# Patient Record
Sex: Male | Born: 1935 | Race: Black or African American | Hispanic: No | Smoking: Former smoker
Health system: Southern US, Community
[De-identification: ages and names within clinical notes are randomized; demographics above are authoritative.]

## PROBLEM LIST (undated history)

## (undated) DIAGNOSIS — A0472 Enterocolitis due to Clostridium difficile, not specified as recurrent: Secondary | ICD-10-CM

## (undated) DIAGNOSIS — R000 Tachycardia, unspecified: Secondary | ICD-10-CM

## (undated) DIAGNOSIS — I5032 Chronic diastolic (congestive) heart failure: Secondary | ICD-10-CM

## (undated) DIAGNOSIS — E8809 Other disorders of plasma-protein metabolism, not elsewhere classified: Secondary | ICD-10-CM

## (undated) DIAGNOSIS — D649 Anemia, unspecified: Secondary | ICD-10-CM

## (undated) DIAGNOSIS — C61 Malignant neoplasm of prostate: Secondary | ICD-10-CM

## (undated) DIAGNOSIS — I119 Hypertensive heart disease without heart failure: Secondary | ICD-10-CM

## (undated) DIAGNOSIS — I10 Essential (primary) hypertension: Secondary | ICD-10-CM

## (undated) DIAGNOSIS — Z8719 Personal history of other diseases of the digestive system: Secondary | ICD-10-CM

## (undated) DIAGNOSIS — J989 Respiratory disorder, unspecified: Secondary | ICD-10-CM

## (undated) DIAGNOSIS — E119 Type 2 diabetes mellitus without complications: Secondary | ICD-10-CM

## (undated) DIAGNOSIS — I493 Ventricular premature depolarization: Secondary | ICD-10-CM

## (undated) DIAGNOSIS — I491 Atrial premature depolarization: Secondary | ICD-10-CM

## (undated) DIAGNOSIS — E785 Hyperlipidemia, unspecified: Secondary | ICD-10-CM

## (undated) DIAGNOSIS — I468 Cardiac arrest due to other underlying condition: Secondary | ICD-10-CM

## (undated) DIAGNOSIS — E46 Unspecified protein-calorie malnutrition: Secondary | ICD-10-CM

## (undated) DIAGNOSIS — K56609 Unspecified intestinal obstruction, unspecified as to partial versus complete obstruction: Secondary | ICD-10-CM

## (undated) HISTORY — DX: Other disorders of plasma-protein metabolism, not elsewhere classified: E88.09

## (undated) HISTORY — DX: Chronic diastolic (congestive) heart failure: I50.32

## (undated) HISTORY — DX: Unspecified intestinal obstruction, unspecified as to partial versus complete obstruction: K56.609

## (undated) HISTORY — DX: Tachycardia, unspecified: R00.0

## (undated) HISTORY — DX: Unspecified protein-calorie malnutrition: E46

## (undated) HISTORY — PX: TONSILLECTOMY: SUR1361

## (undated) HISTORY — DX: Enterocolitis due to Clostridium difficile, not specified as recurrent: A04.72

## (undated) HISTORY — DX: Hyperlipidemia, unspecified: E78.5

## (undated) HISTORY — PX: INGUINAL HERNIA REPAIR: SUR1180

## (undated) HISTORY — DX: Anemia, unspecified: D64.9

## (undated) HISTORY — DX: Ventricular premature depolarization: I49.3

## (undated) HISTORY — DX: Hypertensive heart disease without heart failure: I11.9

---

## 1967-07-12 HISTORY — PX: HEMORROIDECTOMY: SUR656

## 1979-07-12 HISTORY — PX: HERNIA REPAIR: SHX51

## 2010-02-17 ENCOUNTER — Ambulatory Visit: Payer: Self-pay | Admitting: Cardiology

## 2010-06-21 ENCOUNTER — Ambulatory Visit: Payer: Self-pay | Admitting: Cardiology

## 2010-10-20 ENCOUNTER — Encounter: Payer: Self-pay | Admitting: Cardiology

## 2010-10-20 DIAGNOSIS — I10 Essential (primary) hypertension: Secondary | ICD-10-CM | POA: Insufficient documentation

## 2010-10-21 ENCOUNTER — Ambulatory Visit (INDEPENDENT_AMBULATORY_CARE_PROVIDER_SITE_OTHER): Payer: Medicare Other | Admitting: Cardiology

## 2010-10-21 ENCOUNTER — Encounter: Payer: Self-pay | Admitting: Cardiology

## 2010-10-21 DIAGNOSIS — I10 Essential (primary) hypertension: Secondary | ICD-10-CM

## 2010-10-21 DIAGNOSIS — E785 Hyperlipidemia, unspecified: Secondary | ICD-10-CM

## 2010-10-21 DIAGNOSIS — E119 Type 2 diabetes mellitus without complications: Secondary | ICD-10-CM

## 2010-10-21 NOTE — Assessment & Plan Note (Signed)
The patient has been doing well since last visit.  He is not having any side effects from the Lipitor.  He did not come to the office fasting today so we did not draw lab work today.

## 2010-10-21 NOTE — Assessment & Plan Note (Signed)
The patient has not been having any chest pain or shortness of breath.  He denies any dizziness or syncope.  He's not having any palpitations.  He stays physically active by gardening and also does some walking.

## 2010-10-21 NOTE — Progress Notes (Signed)
HPI: This pleasant 75 year old gentleman is seen for a four-month followup office visit.  He has a history of essential hypertension and history of diabetes mellitus and exogenous obesity.  Since last visit he's been doing well.  He does not have any history of ischemic heart disease.  He had a normal today treadmill Cardiolite stress test on 01/08/2004.  He has a history of mild cardiomegaly and had a echocardiogram 04/17/06 which showed mild LVH with normal systolic function and with diastolic dysfunction.  Current Outpatient Prescriptions  Medication Sig Dispense Refill  . aspirin 500 MG EC tablet Take 500 mg by mouth every 6 (six) hours as needed.        Marland Kitchen atorvastatin (LIPITOR) 40 MG tablet Take 40 mg by mouth daily.        Marland Kitchen diltiazem (TIAZAC) 360 MG 24 hr capsule Take 360 mg by mouth daily.        Marland Kitchen glipiZIDE (GLUCOTROL) 2.5 MG 24 hr tablet Take 2.5 mg by mouth daily.        . hydrochlorothiazide (HYDRODIURIL) 12.5 MG tablet Take 12.5 mg by mouth daily.        . Liraglutide (VICTOZA) 18 MG/3ML SOLN Inject into the skin as directed.        . metFORMIN (GLUCOPHAGE) 500 MG tablet Take 500 mg by mouth 2 (two) times daily with a meal.        . pioglitazone (ACTOS) 30 MG tablet Take 30 mg by mouth daily.          No Known Allergies  Patient Active Problem List  Diagnoses  . Hyperlipidemia  . Hypertension  . Diabetes mellitus    History  Smoking status  . Former Smoker -- 0.3 packs/day for 20 years  . Types: Cigarettes  . Quit date: 07/12/1979  Smokeless tobacco  . Never Used    History  Alcohol Use No    No family history on file.  Review of Systems: The patient denies any heat or cold intolerance.  No weight gain or weight loss.  The patient denies headaches or blurry vision.  There is no cough or sputum production.  The patient denies dizziness.  There is no hematuria or hematochezia.  The patient denies any muscle aches or arthritis.  The patient denies any rash.  The  patient denies frequent falling or instability.  There is no history of depression or anxiety.  All other systems were reviewed and are negative.   Physical Exam: Filed Vitals:   10/21/10 0924  BP: 120/60  Pulse: 78  Weight is 223, down 1 pound.  The general appearance reveals a well-developed well-nourished gentleman in no distress.Pupils equal and reactive.   Extraocular Movements are full.  There is no scleral icterus.  The mouth and pharynx are normal.  The neck is supple.  The carotids reveal no bruits.  The jugular venous pressure is normal.  The thyroid is not enlarged.  There is no lymphadenopathy.The chest is clear to percussion and auscultation. There are no rales or rhonchi. Expansion of the chest is symmetrical.The precordium is quiet.  The first heart sound is normal.  The second heart sound is physiologically split.  There is no murmur gallop rub or click.  There is no abnormal lift or heave.The abdomen is soft and nontender. Bowel sounds are normal. The liver and spleen are not enlarged. There Are no abdominal masses. There are no bruits.The pedal pulses are good.  There is no phlebitis or edema.  There  is no cyanosis or clubbing.Strength is normal and symmetrical in all extremities.  There is no lateralizing weakness.  There are no sensory deficits.The skin is warm and dry.  There is no rash.    Assessment / Plan: Continue same medication and be rechecked in 4 months for followup office visit fasting lipid panel and chemistries

## 2010-10-21 NOTE — Assessment & Plan Note (Signed)
The patient states that his blood sugars at home are generally in the range of 100 or so.  He is not having any hypoglycemic reactions.  His diabetes is followed closely by Dr. Lucianne Muss.

## 2011-03-07 ENCOUNTER — Other Ambulatory Visit: Payer: Self-pay | Admitting: Cardiology

## 2011-03-07 ENCOUNTER — Other Ambulatory Visit: Payer: Medicare Other | Admitting: *Deleted

## 2011-03-07 DIAGNOSIS — E785 Hyperlipidemia, unspecified: Secondary | ICD-10-CM

## 2011-03-07 DIAGNOSIS — I10 Essential (primary) hypertension: Secondary | ICD-10-CM

## 2011-03-09 ENCOUNTER — Ambulatory Visit
Admission: RE | Admit: 2011-03-09 | Discharge: 2011-03-09 | Disposition: A | Discharge status: Home / Self Care | Payer: Medicare Other | Source: Ambulatory Visit | Attending: Cardiology | Admitting: Cardiology

## 2011-03-09 ENCOUNTER — Encounter: Payer: Self-pay | Admitting: Cardiology

## 2011-03-09 ENCOUNTER — Ambulatory Visit (INDEPENDENT_AMBULATORY_CARE_PROVIDER_SITE_OTHER): Payer: Medicare Other | Admitting: Cardiology

## 2011-03-09 ENCOUNTER — Other Ambulatory Visit (INDEPENDENT_AMBULATORY_CARE_PROVIDER_SITE_OTHER): Payer: Medicare Other | Admitting: *Deleted

## 2011-03-09 VITALS — BP 120/60 | HR 66 | Wt 222.0 lb

## 2011-03-09 DIAGNOSIS — I119 Hypertensive heart disease without heart failure: Secondary | ICD-10-CM

## 2011-03-09 DIAGNOSIS — I10 Essential (primary) hypertension: Secondary | ICD-10-CM

## 2011-03-09 DIAGNOSIS — E119 Type 2 diabetes mellitus without complications: Secondary | ICD-10-CM

## 2011-03-09 DIAGNOSIS — E7800 Pure hypercholesterolemia, unspecified: Secondary | ICD-10-CM

## 2011-03-09 DIAGNOSIS — E785 Hyperlipidemia, unspecified: Secondary | ICD-10-CM

## 2011-03-09 LAB — HEPATIC FUNCTION PANEL
ALT: 16 U/L (ref 0–53)
Bilirubin, Direct: 0.1 mg/dL (ref 0.0–0.3)
Total Bilirubin: 0.6 mg/dL (ref 0.3–1.2)
Total Protein: 6.6 g/dL (ref 6.0–8.3)

## 2011-03-09 LAB — LIPID PANEL
Cholesterol: 124 mg/dL (ref 0–200)
HDL: 41.9 mg/dL (ref >39.00)
LDL Cholesterol: 70 mg/dL (ref 0–99)
Total CHOL/HDL Ratio: 3
Triglycerides: 61 mg/dL (ref 0.0–149.0)
VLDL: 12.2 mg/dL (ref 0.0–40.0)

## 2011-03-09 LAB — BASIC METABOLIC PANEL
BUN: 28 mg/dL — ABNORMAL HIGH (ref 6–23)
Calcium: 9.1 mg/dL (ref 8.4–10.5)
Creatinine, Ser: 1.4 mg/dL (ref 0.4–1.5)

## 2011-03-09 NOTE — Assessment & Plan Note (Signed)
The patient has remained on as careful diet.  He has lost 1 pound.  He remains on atorvastatin 40 mg daily.  Is not having any myalgias from the atorvastatin.  He does have some chronic left shoulder aching discomfort which is on a structural orthopedic basis.

## 2011-03-09 NOTE — Progress Notes (Signed)
Jeremy Jennings Date of Birth:  1935-11-13 [facility] [address]   [address] [place], [place]  [address] [phone]           Fax   [phone]  HPI: This pleasant 75 year old African American gentleman is seen for a scheduled 4 month followup office visit.  He has a history of hypertension and diabetes and exogenous obesity.  He does not have any history of ischemic heart disease.  He had a normal treadmill Cardiolite stress test on 01/08/04.  He has a history of mild cardiomegaly and an echocardiogram on 04/17/06 showed mild LVH with normal systolic function but with diastolic dysfunction.  Since last visit the patient has had no new cardiac symptoms.  He denies chest pain or shortness of breath.  He denies any peripheral edema.  His not having any dizziness or syncope.  Current Outpatient Prescriptions  Medication Sig Dispense Refill  . aspirin 500 MG EC tablet Take 500 mg by mouth every 6 (six) hours as needed.        Marland Kitchen atorvastatin (LIPITOR) 40 MG tablet Take 40 mg by mouth daily.        . Cholecalciferol (VITAMIN D PO) Take by mouth daily.        Marland Kitchen diltiazem (TIAZAC) 360 MG 24 hr capsule Take 360 mg by mouth daily.        Marland Kitchen glipiZIDE (GLUCOTROL) 2.5 MG 24 hr tablet Take 2.5 mg by mouth daily.        . hydrochlorothiazide (HYDRODIURIL) 12.5 MG tablet Take 12.5 mg by mouth daily.        . Liraglutide (VICTOZA) 18 MG/3ML SOLN Inject into the skin as directed.        . metFORMIN (GLUCOPHAGE) 500 MG tablet Take 500 mg by mouth 2 (two) times daily with a meal.        . pioglitazone (ACTOS) 30 MG tablet Take 30 mg by mouth daily.          No Known Allergies  Patient Active Problem List  Diagnoses  . Hyperlipidemia  . Hypertension  . Diabetes mellitus    History  Smoking status  . Former Smoker -- 0.3 packs/day for 20 years  . Types: Cigarettes  . Quit date: 07/12/1979  Smokeless tobacco  . Never Used    History  Alcohol Use No    No  family history on file.  Review of Systems: The patient denies any heat or cold intolerance.  No weight gain or weight loss.  The patient denies headaches or blurry vision.  There is no cough or sputum production.  The patient denies dizziness.  There is no hematuria or hematochezia.  The patient denies any muscle aches or arthritis.  The patient denies any rash.  The patient denies frequent falling or instability.  There is no history of depression or anxiety.  All other systems were reviewed and are negative.   Physical Exam: Filed Vitals:   03/09/11 1526  BP: 120/60  Pulse: 66  This pleasant middle-aged gentleman is seen.  He is in no distressPupils equal and reactive.   Extraocular Movements are full.  There is no scleral icterus.  The mouth and pharynx are normal.  The neck is supple.  The carotids reveal no bruits.  The jugular venous pressure is normal.  The thyroid is not enlarged.  There is no lymphadenopathy.  The chest is clear to percussion and auscultation. There are no rales or rhonchi. Expansion  of the chest is symmetrical.  The precordium is quiet.  The first heart sound is normal.  The second heart sound is physiologically split.  There is no murmur gallop rub or click.  There is no abnormal lift or heave.  The abdomen is soft and nontender. Bowel sounds are normal. The liver and spleen are not enlarged. There Are no abdominal masses. There are no bruits.  The pedal pulses are good.  There is no phlebitis or edema.  There is no cyanosis or clubbing.  Strength is normal and symmetrical in all extremities.  There is no lateralizing weakness.  There are no sensory deficits.      Assessment / Plan: Continue same medication.  Recheck in 4 months for followup office visit.  Continue to try to lose weight and to maintain aerobic exercise

## 2011-03-09 NOTE — Assessment & Plan Note (Signed)
His diabetes is followed closely by Dr. Lucianne Muss.  The patient denies any hypoglycemic symptoms

## 2011-03-09 NOTE — Assessment & Plan Note (Signed)
No dizziness or syncope.  No headaches

## 2011-03-24 NOTE — Progress Notes (Signed)
Left message

## 2011-03-25 ENCOUNTER — Telehealth: Payer: Self-pay | Admitting: Cardiology

## 2011-03-25 NOTE — Telephone Encounter (Signed)
Called returing Jeremy Jennings's call about his chest X-ray results that he had done 10 days ago. Please call back. I have pulled his chart.

## 2011-03-25 NOTE — Telephone Encounter (Signed)
Message copied by Eugenia Pancoast on Fri Mar 25, 2011  9:10 AM ------      Message from: Cassell Clement      Created: Wed Mar 09, 2011  5:27 PM       Please report.  The chest x-ray is normal.  No sign of lung cancer.  The heart size is normal and the lungs are clear

## 2011-03-25 NOTE — Telephone Encounter (Signed)
Advised CXR ok

## 2011-07-15 ENCOUNTER — Encounter: Payer: Self-pay | Admitting: Cardiology

## 2011-07-15 ENCOUNTER — Ambulatory Visit (INDEPENDENT_AMBULATORY_CARE_PROVIDER_SITE_OTHER): Payer: Medicare Other | Admitting: Cardiology

## 2011-07-15 ENCOUNTER — Other Ambulatory Visit: Payer: Medicare Other | Admitting: *Deleted

## 2011-07-15 VITALS — BP 110/60 | HR 78 | Ht 72.0 in | Wt 224.0 lb

## 2011-07-15 DIAGNOSIS — M67919 Unspecified disorder of synovium and tendon, unspecified shoulder: Secondary | ICD-10-CM

## 2011-07-15 DIAGNOSIS — E7800 Pure hypercholesterolemia, unspecified: Secondary | ICD-10-CM

## 2011-07-15 DIAGNOSIS — M7552 Bursitis of left shoulder: Secondary | ICD-10-CM

## 2011-07-15 DIAGNOSIS — Z79899 Other long term (current) drug therapy: Secondary | ICD-10-CM

## 2011-07-15 DIAGNOSIS — I119 Hypertensive heart disease without heart failure: Secondary | ICD-10-CM

## 2011-07-15 DIAGNOSIS — I10 Essential (primary) hypertension: Secondary | ICD-10-CM

## 2011-07-15 LAB — LIPID PANEL
HDL: 47 mg/dL (ref >39.00)
Total CHOL/HDL Ratio: 3
Triglycerides: 64 mg/dL (ref 0.0–149.0)
VLDL: 12.8 mg/dL (ref 0.0–40.0)

## 2011-07-15 LAB — HEPATIC FUNCTION PANEL
AST: 17 U/L (ref 0–37)
Albumin: 3.9 g/dL (ref 3.5–5.2)
Total Bilirubin: 0.4 mg/dL (ref 0.3–1.2)

## 2011-07-15 LAB — TSH: TSH: 3.77 u[IU]/mL (ref 0.35–5.50)

## 2011-07-15 LAB — BASIC METABOLIC PANEL
CO2: 27 mEq/L (ref 19–32)
Chloride: 104 mEq/L (ref 96–112)
Potassium: 4 mEq/L (ref 3.5–5.1)
Sodium: 139 mEq/L (ref 135–145)

## 2011-07-15 NOTE — Patient Instructions (Addendum)
Will obtain labs today and call you with the results  Have scheduled an appointment with Dr. Darrelyn Hillock on July 26, 2011 arrive at 8:00 for 8:15 am [phone]  Your physician recommends that you continue on your current medications as directed. Please refer to the Current Medication list given to you today.  Your physician wants you to follow-up in: 4 months You will receive a reminder letter in the mail two months in advance. If you don't receive a letter, please call our office to schedule the follow-up appointment.

## 2011-07-15 NOTE — Assessment & Plan Note (Signed)
The patient has not been having any chest pain increased dyspnea dizziness or syncope.  No palpitations.

## 2011-07-15 NOTE — Assessment & Plan Note (Signed)
The patient has had pain in his left shoulder for the past 2-3 weeks.  He is unable to elevate his left arm above the horizontal position.  There is no history of any trauma.  We are going to refer him to orthopedics for further evaluation

## 2011-07-15 NOTE — Progress Notes (Signed)
Jeremy Jennings Date of Birth:  03-02-1936 [facility] [address] [place], [place]  [address] [phone]         Fax   [phone]  History of Present Illness: This pleasant 76 year old gentleman is seen for a scheduled followup office visit.  His history of diabetes, essential hypertension, and exogenous obesity.  He does not have any history of ischemic heart disease.  He had a normal treadmill Cardiolite stress test in 2005.  He has a history of mild cardiomegaly.  An echocardiogram in 2007 showed mild LVH with normal systolic function and with diastolic dysfunction.  The patient has not been expressing any new cardiac symptoms since last visit.  Current Outpatient Prescriptions  Medication Sig Dispense Refill  . aspirin 500 MG EC tablet Take 500 mg by mouth every 6 (six) hours as needed.        Marland Kitchen atorvastatin (LIPITOR) 40 MG tablet Take 40 mg by mouth daily.        . Cholecalciferol (VITAMIN D PO) Take by mouth daily.        Marland Kitchen diltiazem (TIAZAC) 360 MG 24 hr capsule Take 360 mg by mouth daily.        Marland Kitchen glipiZIDE (GLUCOTROL) 2.5 MG 24 hr tablet Take 2.5 mg by mouth daily.        . hydrochlorothiazide (HYDRODIURIL) 12.5 MG tablet Take 12.5 mg by mouth daily.        . Liraglutide (VICTOZA) 18 MG/3ML SOLN Inject into the skin as directed.        . metFORMIN (GLUCOPHAGE) 500 MG tablet Take 500 mg by mouth 2 (two) times daily with a meal.        . pioglitazone (ACTOS) 30 MG tablet Take 30 mg by mouth daily.        Marland Kitchen FREESTYLE LITE test strip       . latanoprost (XALATAN) 0.005 % ophthalmic solution Place 0.005 % into both eyes as directed.        No Known Allergies  Patient Active Problem List  Diagnoses  . Hyperlipidemia  . Hypertension  . Diabetes mellitus    History  Smoking status  . Former Smoker -- 0.3 packs/day for 20 years  . Types: Cigarettes  . Quit date: 07/12/1979  Smokeless tobacco  . Never Used    History  Alcohol Use No    No  family history on file.  Review of Systems: Constitutional: no fever chills diaphoresis or fatigue or change in weight.  Head and neck: no hearing loss, no epistaxis, no photophobia or visual disturbance. Respiratory: No cough, shortness of breath or wheezing. Cardiovascular: No chest pain peripheral edema, palpitations. Gastrointestinal: No abdominal distention, no abdominal pain, no change in bowel habits hematochezia or melena. Genitourinary: No dysuria, no frequency, no urgency, no nocturia. Musculoskeletal:No arthralgias, no back pain, no gait disturbance or myalgias. Neurological: No dizziness, no headaches, no numbness, no seizures, no syncope, no weakness, no tremors. Hematologic: No lymphadenopathy, no easy bruising. Psychiatric: No confusion, no hallucinations, no sleep disturbance.    Physical Exam: Filed Vitals:   07/15/11 0900  BP: 110/60  Pulse: 78   The general appearance reveals a well-developed well-nourished gentleman in no distress.The head and neck exam reveals pupils equal and reactive.  Extraocular movements are full.  There is no scleral icterus.  The mouth and pharynx are normal.  The neck is supple.  The carotids reveal no bruits.  The jugular venous pressure is normal.  The  thyroid is not enlarged.  There is no lymphadenopathy.  The chest is clear to percussion and auscultation.  There are no rales or rhonchi.  Expansion of the chest is symmetrical.  The precordium is quiet.  The first heart sound is normal.  The second heart sound is physiologically split.  There is no murmur gallop rub or click.  There is no abnormal lift or heave.  The abdomen is soft and nontender.  The bowel sounds are normal.  The liver and spleen are not enlarged.  There are no abdominal masses.  There are no abdominal bruits.  Extremities reveal good pedal pulses.  There is no phlebitis or edema.  There is no cyanosis or clubbing.  Strength is normal and symmetrical in all extremities.   Elevation of the left arm is limited by pain the left shoulder.  There is no lateralizing weakness.  There are no sensory deficits.  The skin is warm and dry.  There is no rash.    Assessment / Plan:  Same medication.  For orthopedics regarding his left shoulder which may be bursitis or possibly a torn rotator cuff

## 2011-07-21 ENCOUNTER — Telehealth: Payer: Self-pay | Admitting: *Deleted

## 2011-07-21 NOTE — Telephone Encounter (Signed)
Message copied by Burnell Blanks on Thu Jul 21, 2011  5:36 PM ------      Message from: Cassell Clement      Created: Sun Jul 17, 2011  8:42 AM       Please report.  The labs are stable.  Continue same meds.  Continue careful diet.  Thyroid okay.      Send copy to patient to take to Dr. Lucianne Muss

## 2011-07-21 NOTE — Telephone Encounter (Signed)
Mailed copy of labs and left message to call if any questions Faxed to Dr Lucianne Muss

## 2011-11-21 ENCOUNTER — Encounter: Payer: Self-pay | Admitting: Cardiology

## 2011-11-21 ENCOUNTER — Ambulatory Visit (INDEPENDENT_AMBULATORY_CARE_PROVIDER_SITE_OTHER): Payer: Medicare Other | Admitting: Cardiology

## 2011-11-21 VITALS — BP 142/62 | HR 71 | Ht 71.0 in | Wt 224.4 lb

## 2011-11-21 DIAGNOSIS — E119 Type 2 diabetes mellitus without complications: Secondary | ICD-10-CM

## 2011-11-21 DIAGNOSIS — I119 Hypertensive heart disease without heart failure: Secondary | ICD-10-CM

## 2011-11-21 DIAGNOSIS — M7552 Bursitis of left shoulder: Secondary | ICD-10-CM

## 2011-11-21 DIAGNOSIS — I10 Essential (primary) hypertension: Secondary | ICD-10-CM

## 2011-11-21 DIAGNOSIS — M719 Bursopathy, unspecified: Secondary | ICD-10-CM

## 2011-11-21 DIAGNOSIS — E785 Hyperlipidemia, unspecified: Secondary | ICD-10-CM

## 2011-11-21 NOTE — Progress Notes (Signed)
Donnella Sham Date of Birth:  Sep 03, 1935 [facility] [address] [place], [place]  [address] [phone]         Fax   [phone]  History of Present Illness: This pleasant 76 year old [place] American gentleman is seen for a scheduled followup office visit.  He has a past history of diabetes, essential hypertension, and exogenous obesity.  He does not have any history of known ischemic heart disease.  He had a normal treadmill Cardiolite stress test in 2005.  He has had a history of mild cardiomegaly and an echocardiogram in 2007 showed mild LVH with normal systolic function and with diastolic dysfunction.  The patient gets some exercise.  He enjoys gardening.  Since last visit he is in not been aware of any new cardiac symptoms. Current Outpatient Prescriptions  Medication Sig Dispense Refill  . aspirin 500 MG EC tablet Take 500 mg by mouth every 6 (six) hours as needed.        Marland Kitchen atorvastatin (LIPITOR) 40 MG tablet Take 40 mg by mouth daily.        . Cholecalciferol (VITAMIN D PO) Take by mouth daily.        Marland Kitchen diltiazem (TIAZAC) 360 MG 24 hr capsule Take 360 mg by mouth daily.        Marland Kitchen FREESTYLE LITE test strip       . glipiZIDE (GLUCOTROL) 2.5 MG 24 hr tablet Take 2.5 mg by mouth daily.        . hydrochlorothiazide (HYDRODIURIL) 12.5 MG tablet Take 12.5 mg by mouth daily.        . insulin aspart (NOVOLOG) 100 UNIT/ML injection Inject into the skin 3 (three) times daily before meals.      . latanoprost (XALATAN) 0.005 % ophthalmic solution Place 0.005 % into both eyes as directed.      . Liraglutide (VICTOZA) 18 MG/3ML SOLN Inject into the skin as directed.        . metFORMIN (GLUCOPHAGE) 500 MG tablet Take 500 mg by mouth 2 (two) times daily with a meal.        . pioglitazone (ACTOS) 30 MG tablet Take 30 mg by mouth daily.          No Known Allergies  Patient Active Problem List  Diagnoses  . Hyperlipidemia  . Hypertension  . Diabetes mellitus  .  Bursitis of left shoulder    History  Smoking status  . Former Smoker -- 0.3 packs/day for 20 years  . Types: Cigarettes  . Quit date: 07/12/1979  Smokeless tobacco  . Never Used    History  Alcohol Use No    No family history on file.  Review of Systems: Constitutional: no fever chills diaphoresis or fatigue or change in weight.  Head and neck: no hearing loss, no epistaxis, no photophobia or visual disturbance. Respiratory: No cough, shortness of breath or wheezing. Cardiovascular: No chest pain peripheral edema, palpitations. Gastrointestinal: No abdominal distention, no abdominal pain, no change in bowel habits hematochezia or melena. Genitourinary: No dysuria, no frequency, no urgency, no nocturia. Musculoskeletal:No arthralgias, no back pain, no gait disturbance or myalgias. Neurological: No dizziness, no headaches, no numbness, no seizures, no syncope, no weakness, no tremors. Hematologic: No lymphadenopathy, no easy bruising. Psychiatric: No confusion, no hallucinations, no sleep disturbance.    Physical Exam: Filed Vitals:   11/21/11 1531  BP: 142/62  Pulse: 71   the general appearance reveals a well-developed well-nourished elderly gentleman in no distress.The head  and neck exam reveals pupils equal and reactive.  Extraocular movements are full.  There is no scleral icterus.  The mouth and pharynx are normal.  The neck is supple.  The carotids reveal no bruits.  The jugular venous pressure is normal.  The  thyroid is not enlarged.  There is no lymphadenopathy.  The chest is clear to percussion and auscultation.  There are no rales or rhonchi.  Expansion of the chest is symmetrical.  The precordium is quiet.  The first heart sound is normal.  The second heart sound is physiologically split.  There is no murmur gallop rub or click.  There is no abnormal lift or heave.  The abdomen is soft and nontender.  The bowel sounds are normal.  The liver and spleen are not enlarged.   There are no abdominal masses.  There are no abdominal bruits.  Extremities reveal good pedal pulses.  There is no phlebitis or edema.  There is no cyanosis or clubbing.  Strength is normal and symmetrical in all extremities.  There is no lateralizing weakness.  There are no sensory deficits.  The skin is warm and dry.  There is no rash.     Assessment / Plan: Continue same medication.  Needs to work harder on weight loss.  His weight today is unchanged from 4 months ago.  Recheck in 4 months for followup office visit lipid panel hepatic function panel and basal metabolic panel

## 2011-11-21 NOTE — Assessment & Plan Note (Signed)
The patient has had some steroid injections for his left shoulder pain.  He has seen Dr. Annitta Jersey.  He is still having moderate discomfort.  He is now using some physical therapy exercises to help the shoulder

## 2011-11-21 NOTE — Assessment & Plan Note (Signed)
The patient has a history of hyperlipidemia.  He is on Lipitor.  Is not experiencing any myalgias or arthralgias.

## 2011-11-21 NOTE — Assessment & Plan Note (Signed)
No headaches or dizziness.  No symptoms of CHF.  No syncope or palpitations

## 2011-11-21 NOTE — Assessment & Plan Note (Signed)
His diabetes is followed by Dr. Lucianne Muss.  The patient is on insulin and his hemoglobin A1c's are improving

## 2011-11-21 NOTE — Patient Instructions (Signed)
Your physician recommends that you continue on your current medications as directed. Please refer to the Current Medication list given to you today.  Your physician recommends that you schedule a follow-up appointment in: 4 months with fasting labs (lp/bmet/hfp)  

## 2012-01-18 ENCOUNTER — Encounter (HOSPITAL_COMMUNITY): Payer: Self-pay | Admitting: *Deleted

## 2012-01-18 ENCOUNTER — Emergency Department (HOSPITAL_COMMUNITY)
Admission: EM | Admit: 2012-01-18 | Discharge: 2012-01-18 | Disposition: A | Discharge status: Home / Self Care | Payer: Medicare Other | Attending: Emergency Medicine | Admitting: Emergency Medicine

## 2012-01-18 ENCOUNTER — Telehealth: Payer: Self-pay | Admitting: Cardiology

## 2012-01-18 DIAGNOSIS — Z79899 Other long term (current) drug therapy: Secondary | ICD-10-CM | POA: Insufficient documentation

## 2012-01-18 DIAGNOSIS — S0990XA Unspecified injury of head, initial encounter: Secondary | ICD-10-CM | POA: Insufficient documentation

## 2012-01-18 DIAGNOSIS — E119 Type 2 diabetes mellitus without complications: Secondary | ICD-10-CM | POA: Insufficient documentation

## 2012-01-18 DIAGNOSIS — Y9389 Activity, other specified: Secondary | ICD-10-CM | POA: Insufficient documentation

## 2012-01-18 DIAGNOSIS — I10 Essential (primary) hypertension: Secondary | ICD-10-CM | POA: Insufficient documentation

## 2012-01-18 DIAGNOSIS — E785 Hyperlipidemia, unspecified: Secondary | ICD-10-CM | POA: Insufficient documentation

## 2012-01-18 DIAGNOSIS — W208XXA Other cause of strike by thrown, projected or falling object, initial encounter: Secondary | ICD-10-CM | POA: Insufficient documentation

## 2012-01-18 NOTE — Telephone Encounter (Signed)
Left message to call back at home number and cell number  

## 2012-01-18 NOTE — ED Notes (Signed)
Pt cutting tree limb, limb hit pt in head and knocked down, denies LOC, no evidence of injury

## 2012-01-18 NOTE — Telephone Encounter (Signed)
New msg Pt wants to talk to you. He said he was outside and limb hit him and he wanted discuss with you

## 2012-01-18 NOTE — ED Provider Notes (Signed)
History     CSN: [id number]  Arrival date & time 01/18/12  1140   First MD Initiated Contact with Patient 01/18/12 1247      Chief Complaint  Patient presents with  . Head Injury    (Consider location/radiation/quality/duration/timing/severity/associated sxs/prior treatment) HPI Patient was struck in the head with a tree branch 2 hours ago while cutting down a tree using a chainsaw. He was not to the ground. He hasn't suffered loss of consciousness. No amnesia. No headache. No neck pain .No other associated symptoms. Presently asymptomatic . no treatment prior to coming here Past Medical History  Diagnosis Date  . Hypertension   . Diabetes mellitus   . Heart murmur     SYSTOLIC MURMUR AT BASE  . Hyperlipidemia     Past Surgical History  Procedure Date  . Tonsillectomy     [facility]  . Hemorroidectomy 1969  . Hernia repair AFE 16-17  . Hernia repair 1981    DR WEATHERLY    No family history on file.  History  Substance Use Topics  . Smoking status: Former Smoker -- 0.3 packs/day for 20 years    Types: Cigarettes    Quit date: 07/12/1979  . Smokeless tobacco: Never Used  . Alcohol Use: No   Occasional alcohol use   Review of Systems  Constitutional: Negative.   HENT: Negative.   Respiratory: Negative.   Cardiovascular: Negative.   Gastrointestinal: Negative.   Musculoskeletal: Positive for arthralgias.       Chronic left shoulder pain, unchanged  Skin: Negative.   Neurological: Negative.   Hematological: Negative.   Psychiatric/Behavioral: Negative.     Allergies  Review of patient's allergies indicates no known allergies.  Home Medications   Current Outpatient Rx  Name Route Sig Dispense Refill  . ASPIRIN 500 MG PO TBEC Oral Take 500 mg by mouth every 6 (six) hours as needed.      . ATORVASTATIN CALCIUM 40 MG PO TABS Oral Take 40 mg by mouth daily.      Marland Kitchen VITAMIN D PO Oral Take by mouth daily.      Marland Kitchen DILTIAZEM HCL ER BEADS 360 MG PO CP24 Oral Take  360 mg by mouth daily.      Marland Kitchen GLIPIZIDE ER 2.5 MG PO TB24 Oral Take 2.5 mg by mouth daily.      Marland Kitchen HYDROCHLOROTHIAZIDE 12.5 MG PO TABS Oral Take 12.5 mg by mouth daily.      . INSULIN ASPART 100 UNIT/ML [place] SOLN Subcutaneous Inject 0-7 Units into the skin 3 (three) times daily before meals. Sliding scale    . LATANOPROST 0.005 % OP SOLN Both Eyes Place 0.005 % into both eyes as directed.    Marland Kitchen LIRAGLUTIDE 18 MG/3ML [place] SOLN Subcutaneous Inject into the skin as directed.     Marland Kitchen METFORMIN HCL 500 MG PO TABS Oral Take 500 mg by mouth 2 (two) times daily with a meal.      . PIOGLITAZONE HCL 30 MG PO TABS Oral Take 30 mg by mouth daily.      Marland Kitchen FREESTYLE LITE TEST VI STRP        BP 134/63  Pulse 94  Temp 98.4 F (36.9 C) (Oral)  Resp 18  SpO2 96%  Physical Exam  Nursing note and vitals reviewed. Constitutional: He is oriented to person, place, and time. He appears well-developed and well-nourished.  HENT:  Head: Normocephalic and atraumatic.  Eyes: Conjunctivae are normal. Pupils are equal, round, and reactive  to light.  Neck: Neck supple. No tracheal deviation present. No thyromegaly present.  Cardiovascular: Normal rate and regular rhythm.   No murmur heard. Pulmonary/Chest: Effort normal and breath sounds normal.  Abdominal: Soft. Bowel sounds are normal. He exhibits no distension. There is no tenderness.  Musculoskeletal: Normal range of motion. He exhibits no edema and no tenderness.  Neurological: He is alert and oriented to person, place, and time. He displays normal reflexes. No cranial nerve deficit. Coordination normal.       Gait normal Romberg normal pronator drift normal  Skin: Skin is warm and dry. No rash noted.  Psychiatric: He has a normal mood and affect.    ED Course  Procedures (including critical care time)  Labs Reviewed - No data to display No results found.   No diagnosis found.    MDM  Cervical spine cleared via Nexus criteria; patient does not warrant  head CT. No external evidence of trauma no signs of concussion. Discuss with patient who agrees. Patient stable for discharge Diagnosis minor closed head injury       Doug Sou, MD 01/18/12 1258

## 2012-01-24 NOTE — Telephone Encounter (Signed)
Looked in patients chart and saw where patient went to ED

## 2012-02-15 ENCOUNTER — Encounter: Payer: Self-pay | Admitting: Cardiology

## 2012-03-14 ENCOUNTER — Encounter: Payer: Self-pay | Admitting: Cardiology

## 2012-03-14 ENCOUNTER — Ambulatory Visit (INDEPENDENT_AMBULATORY_CARE_PROVIDER_SITE_OTHER): Payer: Medicare Other | Admitting: Cardiology

## 2012-03-14 ENCOUNTER — Other Ambulatory Visit (INDEPENDENT_AMBULATORY_CARE_PROVIDER_SITE_OTHER): Payer: Medicare Other

## 2012-03-14 VITALS — BP 118/68 | HR 80 | Ht 71.0 in | Wt 227.0 lb

## 2012-03-14 DIAGNOSIS — I119 Hypertensive heart disease without heart failure: Secondary | ICD-10-CM

## 2012-03-14 DIAGNOSIS — I10 Essential (primary) hypertension: Secondary | ICD-10-CM

## 2012-03-14 DIAGNOSIS — E785 Hyperlipidemia, unspecified: Secondary | ICD-10-CM

## 2012-03-14 DIAGNOSIS — IMO0001 Reserved for inherently not codable concepts without codable children: Secondary | ICD-10-CM

## 2012-03-14 LAB — HEPATIC FUNCTION PANEL
Albumin: 3.6 g/dL (ref 3.5–5.2)
Alkaline Phosphatase: 66 U/L (ref 39–117)
Total Protein: 6.9 g/dL (ref 6.0–8.3)

## 2012-03-14 LAB — LIPID PANEL
Cholesterol: 109 mg/dL (ref 0–200)
HDL: 49.1 mg/dL (ref >39.00)
LDL Cholesterol: 46 mg/dL (ref 0–99)
Total CHOL/HDL Ratio: 2
Triglycerides: 68 mg/dL (ref 0.0–149.0)
VLDL: 13.6 mg/dL (ref 0.0–40.0)

## 2012-03-14 LAB — BASIC METABOLIC PANEL
CO2: 27 mEq/L (ref 19–32)
Chloride: 103 mEq/L (ref 96–112)
Creatinine, Ser: 1.5 mg/dL (ref 0.4–1.5)
Sodium: 136 mEq/L (ref 135–145)

## 2012-03-14 NOTE — Assessment & Plan Note (Signed)
Patient has a history of high blood pressure and is remaining stable on his present regimen which includes tekturna- HCT.  He denies any headaches or dizzy spells.  No symptoms of congestive heart failure.  No chest pain or angina.  His last EKG was may 2013 and was satisfactory.

## 2012-03-14 NOTE — Assessment & Plan Note (Signed)
The patient states that his last hemoglobin A1c was in the range of 7.1 or 7.2.  The patient had a ophthalmologic checkup recently and no diabetic retinopathy was found.  The patient denies any hypoglycemic episodes.  He has not been having any peripheral edema.

## 2012-03-14 NOTE — Progress Notes (Signed)
Jeremy Jennings Date of Birth:  [birth year] [facility] [address] [place], [place]  [address] [phone]         Fax   [phone]  History of Present Illness: This pleasant 76 year old [place] American gentleman is seen for a scheduled followup office visit. He has a past history of diabetes, essential hypertension, and exogenous obesity. He does not have any history of known ischemic heart disease. He had a normal treadmill Cardiolite stress test in 2005. He has had a history of mild cardiomegaly and an echocardiogram in 2007 showed mild LVH with normal systolic function and with diastolic dysfunction. The patient gets some exercise. He enjoys gardening.  He has not been able to do as much gardening this year because of the wet weather.  Also he has had an ongoing problem with bursitis of his left shoulder and has been seeing a chiropractor. Since last visit he is in not been aware of any new cardiac symptoms.   Current Outpatient Prescriptions  Medication Sig Dispense Refill  . aspirin 500 MG EC tablet Take 500 mg by mouth every 6 (six) hours as needed.        Marland Kitchen atorvastatin (LIPITOR) 40 MG tablet Take 40 mg by mouth daily.        . Cholecalciferol (VITAMIN D PO) Take by mouth daily. Taking 5000 daily      . diltiazem (TIAZAC) 360 MG 24 hr capsule Take 360 mg by mouth daily.        Marland Kitchen FREESTYLE LITE test strip       . glipiZIDE (GLUCOTROL) 2.5 MG 24 hr tablet Take 2.5 mg by mouth daily.        . hydrochlorothiazide (HYDRODIURIL) 12.5 MG tablet Take 12.5 mg by mouth daily.        . insulin aspart (NOVOLOG) 100 UNIT/ML injection Inject 0-7 Units into the skin 3 (three) times daily before meals. Sliding scale      . latanoprost (XALATAN) 0.005 % ophthalmic solution Place 0.005 % into both eyes as directed.      . Liraglutide (VICTOZA) 18 MG/3ML SOLN Inject into the skin as directed.       . metFORMIN (GLUCOPHAGE) 500 MG tablet Take 500 mg by mouth 2 (two) times  daily with a meal.        . pioglitazone (ACTOS) 30 MG tablet Take 30 mg by mouth daily.        Danie Chandler HCT 150-12.5 MG TABS Daily.        No Known Allergies  Patient Active Problem List  Diagnosis  . Hyperlipidemia  . Hypertension  . Diabetes mellitus  . Bursitis of left shoulder    History  Smoking status  . Former Smoker -- 0.3 packs/day for 20 years  . Types: Cigarettes  . Quit date: 07/12/1979  Smokeless tobacco  . Never Used    History  Alcohol Use No    No family history on file.  Review of Systems: Constitutional: no fever chills diaphoresis or fatigue or change in weight.  Head and neck: no hearing loss, no epistaxis, no photophobia or visual disturbance. Respiratory: No cough, shortness of breath or wheezing. Cardiovascular: No chest pain peripheral edema, palpitations. Gastrointestinal: No abdominal distention, no abdominal pain, no change in bowel habits hematochezia or melena. Genitourinary: No dysuria, no frequency, no urgency, no nocturia. Musculoskeletal:No arthralgias, no back pain, no gait disturbance or myalgias. Neurological: No dizziness, no headaches, no numbness, no seizures, no syncope, no  weakness, no tremors. Hematologic: No lymphadenopathy, no easy bruising. Psychiatric: No confusion, no hallucinations, no sleep disturbance.    Physical Exam: Filed Vitals:   03/14/12 0917  BP: 118/68  Pulse: 80   the general appearance reveals a well-developed well-nourished African American gentleman in no distress.The head and neck exam reveals pupils equal and reactive.  Extraocular movements are full.  There is no scleral icterus.  The mouth and pharynx are normal.  The neck is supple.  The carotids reveal no bruits.  The jugular venous pressure is normal.  The  thyroid is not enlarged.  There is no lymphadenopathy.  The chest is clear to percussion and auscultation.  There are no rales or rhonchi.  Expansion of the chest is symmetrical.  The  precordium is quiet.  The first heart sound is normal.  The second heart sound is physiologically split.  There is no murmur gallop rub or click.  There is no abnormal lift or heave.  The abdomen is soft and nontender.  The bowel sounds are normal.  The liver and spleen are not enlarged.  There are no abdominal masses.  There are no abdominal bruits.  Extremities reveal good pedal pulses.  There is no phlebitis or edema.  There is no cyanosis or clubbing.  Strength is normal and symmetrical in all extremities.  There is no lateralizing weakness.  There are no sensory deficits.  The skin is warm and dry.  There is no rash.     Assessment / Plan: Overall the patient appears to be doing well.  However his weight is up 3 pounds since last visit and he will work harder on weight loss.  Return here in 4 months for followup office visit lipid panel hepatic function panel and basal metabolic panel.

## 2012-03-14 NOTE — Assessment & Plan Note (Signed)
The patient has a history of dyslipidemia and is on Lipitor 40 mg daily.  Blood work is pending

## 2012-03-14 NOTE — Progress Notes (Signed)
Quick Note:  Please report to patient. The recent labs are stable. Continue same medication and careful diet. Send copy to Dr. Lucianne Muss. ______

## 2012-03-14 NOTE — Patient Instructions (Addendum)
Will obtain labs today and call you with the results  Your physician recommends that you continue on your current medications as directed. Please refer to the Current Medication list given to you today.  Your physician wants you to follow-up in: 4 months with fasting labs (lp/bmet/hfp)  You will receive a reminder letter in the mail two months in advance. If you don't receive a letter, please call our office to schedule the follow-up appointment.  

## 2012-03-20 ENCOUNTER — Telehealth: Payer: Self-pay | Admitting: *Deleted

## 2012-03-20 NOTE — Telephone Encounter (Signed)
Advised patient of lab results  

## 2012-03-20 NOTE — Telephone Encounter (Signed)
Message copied by Burnell Blanks on Tue Mar 20, 2012  9:45 AM ------      Message from: Cassell Clement      Created: Wed Mar 14, 2012  7:18 PM       Please report to patient.  The recent labs are stable. Continue same medication and careful diet. Send copy to Dr. Lucianne Muss.

## 2012-07-24 ENCOUNTER — Other Ambulatory Visit (INDEPENDENT_AMBULATORY_CARE_PROVIDER_SITE_OTHER): Payer: Medicare Other

## 2012-07-24 ENCOUNTER — Encounter: Payer: Self-pay | Admitting: Cardiology

## 2012-07-24 ENCOUNTER — Ambulatory Visit (INDEPENDENT_AMBULATORY_CARE_PROVIDER_SITE_OTHER): Payer: Medicare Other | Admitting: Cardiology

## 2012-07-24 VITALS — BP 130/70 | HR 87 | Resp 18 | Ht 71.0 in | Wt 225.8 lb

## 2012-07-24 DIAGNOSIS — I491 Atrial premature depolarization: Secondary | ICD-10-CM

## 2012-07-24 DIAGNOSIS — I10 Essential (primary) hypertension: Secondary | ICD-10-CM

## 2012-07-24 DIAGNOSIS — R0989 Other specified symptoms and signs involving the circulatory and respiratory systems: Secondary | ICD-10-CM

## 2012-07-24 DIAGNOSIS — I119 Hypertensive heart disease without heart failure: Secondary | ICD-10-CM

## 2012-07-24 LAB — LIPID PANEL
HDL: 44.3 mg/dL (ref >39.00)
LDL Cholesterol: 58 mg/dL (ref 0–99)
Total CHOL/HDL Ratio: 3
VLDL: 14.4 mg/dL (ref 0.0–40.0)

## 2012-07-24 LAB — BASIC METABOLIC PANEL
BUN: 23 mg/dL (ref 6–23)
CO2: 27 mEq/L (ref 19–32)
Chloride: 100 mEq/L (ref 96–112)
Creatinine, Ser: 1.5 mg/dL (ref 0.4–1.5)

## 2012-07-24 LAB — HEPATIC FUNCTION PANEL
AST: 21 U/L (ref 0–37)
Albumin: 3.7 g/dL (ref 3.5–5.2)
Alkaline Phosphatase: 69 U/L (ref 39–117)
Bilirubin, Direct: 0 mg/dL (ref 0.0–0.3)

## 2012-07-24 NOTE — Patient Instructions (Addendum)
Your physician recommends that you continue on your current medications as directed. Please refer to the Current Medication list given to you today.  Your physician wants you to follow-up in: 4 months with fasting labs (lp/bmet/hfp) You will receive a reminder letter in the mail two months in advance. If you don't receive a letter, please call our office to schedule the follow-up appointment.   Will obtain labs today and call you with the results (lp/bmet/hfp)  

## 2012-07-24 NOTE — Progress Notes (Signed)
Donnella Sham Date of Birth:  1936-05-06 [facility] [address] [place], [place]  [address] [phone]         Fax   [phone]  History of Present Illness: This pleasant 77 year old [place] American gentleman is seen for a scheduled followup office visit. He has a past history of diabetes, essential hypertension, and exogenous obesity. He does not have any history of known ischemic heart disease. He had a normal treadmill Cardiolite stress test in 2005. He has had a history of mild cardiomegaly and an echocardiogram in 2007 showed mild LVH with normal systolic function and with diastolic dysfunction. The patient gets some exercise. He enjoys gardening. He has not been able to do as much gardening this year because of the wet weather. Also he has had an ongoing problem with bursitis of his left shoulder and has been seeing a chiropractor.  His shoulder problem has improved since last visit Since last visit he is in not been aware of any new cardiac symptoms. He has not been aware of any palpitations.   Current Outpatient Prescriptions  Medication Sig Dispense Refill  . aspirin 500 MG EC tablet Take 500 mg by mouth every 6 (six) hours as needed.        Marland Kitchen atorvastatin (LIPITOR) 40 MG tablet Take 40 mg by mouth daily.        . Cholecalciferol (VITAMIN D PO) Take by mouth daily. Taking 5000 daily      . diltiazem (TIAZAC) 360 MG 24 hr capsule Take 360 mg by mouth daily.        Marland Kitchen FREESTYLE LITE test strip       . glipiZIDE (GLUCOTROL) 2.5 MG 24 hr tablet Take 2.5 mg by mouth daily.        . insulin aspart (NOVOLOG) 100 UNIT/ML injection Inject 0-7 Units into the skin 3 (three) times daily before meals. Sliding scale      . latanoprost (XALATAN) 0.005 % ophthalmic solution Place 0.005 % into both eyes as directed.      . Liraglutide (VICTOZA) 18 MG/3ML SOLN Inject into the skin as directed.       . metFORMIN (GLUCOPHAGE) 500 MG tablet Take 500 mg by mouth 2 (two) times  daily with a meal.        . pioglitazone (ACTOS) 30 MG tablet Take 30 mg by mouth daily.        Danie Chandler HCT 150-12.5 MG TABS Daily.      . hydrochlorothiazide (HYDRODIURIL) 12.5 MG tablet Take 12.5 mg by mouth daily.          No Known Allergies  Patient Active Problem List  Diagnosis  . Hyperlipidemia  . Hypertension  . Type II or unspecified type diabetes mellitus without mention of complication, uncontrolled  . Bursitis of left shoulder  . PAC (premature atrial contraction)    History  Smoking status  . Former Smoker -- 0.3 packs/day for 20 years  . Types: Cigarettes  . Quit date: 07/12/1979  Smokeless tobacco  . Never Used    History  Alcohol Use No    No family history on file.  Review of Systems: Constitutional: no fever chills diaphoresis or fatigue or change in weight.  Head and neck: no hearing loss, no epistaxis, no photophobia or visual disturbance. Respiratory: No cough, shortness of breath or wheezing. Cardiovascular: No chest pain peripheral edema, palpitations. Gastrointestinal: No abdominal distention, no abdominal pain, no change in bowel habits hematochezia or melena.  Genitourinary: No dysuria, no frequency, no urgency, no nocturia. Musculoskeletal:No arthralgias, no back pain, no gait disturbance or myalgias. Neurological: No dizziness, no headaches, no numbness, no seizures, no syncope, no weakness, no tremors. Hematologic: No lymphadenopathy, no easy bruising. Psychiatric: No confusion, no hallucinations, no sleep disturbance.    Physical Exam: Filed Vitals:   07/24/12 0931  BP: 130/70  Pulse: 87  Resp: 18   the general appearance reveals a well-developed well-nourished gentleman in no distress.The head and neck exam reveals pupils equal and reactive.  Extraocular movements are full.  There is no scleral icterus.  The mouth and pharynx are normal.  The neck is supple.  The carotids reveal no bruits.  The jugular venous pressure is normal.   The  thyroid is not enlarged.  There is no lymphadenopathy.  The chest is clear to percussion and auscultation.  There are no rales or rhonchi.  Expansion of the chest is symmetrical.  The precordium is quiet.  The pulse is irregular because of frequent premature atrial beats  The first heart sound is normal.  The second heart sound is physiologically split.  There is no murmur gallop rub or click.  There is no abnormal lift or heave.  The abdomen is soft and nontender.  The bowel sounds are normal.  The liver and spleen are not enlarged.  There are no abdominal masses.  There are no abdominal bruits.  Extremities reveal good pedal pulses.  There is no phlebitis or edema.  There is no cyanosis or clubbing.  Strength is normal and symmetrical in all extremities.  There is no lateralizing weakness.  There are no sensory deficits.  The skin is warm and dry.  There is no rash.   EKG shows normal sinus rhythm with PACs and otherwise normal  Assessment / Plan: Continue same medications.  Recheck in 4 months for followup office visit and lab work and EKG. Today the patient brought in a nice but that he had written about his early life [place]

## 2012-07-24 NOTE — Assessment & Plan Note (Signed)
On examination today the patient is having frequent premature beats.  EKG demonstrates that this is premature atrial contractions and is not atrial fibrillation.

## 2012-07-24 NOTE — Assessment & Plan Note (Signed)
His diabetes is improving under the good care of Dr. Lucianne Muss.  No recent hypoglycemic episode

## 2012-07-24 NOTE — Assessment & Plan Note (Signed)
The patient has not been experiencing any chest pain or shortness of breath.  Energy level is good.  No peripheral edema.

## 2012-07-25 NOTE — Progress Notes (Signed)
Quick Note:  Please report to patient. The recent labs are stable. Continue same medication and careful diet. Blood sugar 160 but I believe he said he had a small snack earlier today because he was feeling hypoglycemic while fasting for blood work ______

## 2012-07-27 ENCOUNTER — Telehealth: Payer: Self-pay | Admitting: *Deleted

## 2012-07-27 NOTE — Telephone Encounter (Signed)
Message copied by Burnell Blanks on Fri Jul 27, 2012  1:50 PM ------      Message from: Cassell Clement      Created: Wed Jul 25, 2012 11:28 AM       Please report to patient.  The recent labs are stable. Continue same medication and careful diet.  Blood sugar 160 but I believe he said he had a small snack earlier today because he was feeling hypoglycemic while fasting for blood work

## 2012-07-27 NOTE — Telephone Encounter (Signed)
Mailed copy of labs and left message to call if any questions  

## 2012-09-04 IMAGING — CR DG CHEST 2V
2 series · 2 of 2 positions shown · non-contrast
Comparison: None.

CLINICAL DATA: 75-year-old male with hypertension, former smoker.

CHEST - 2 VIEW

[w chest pa]
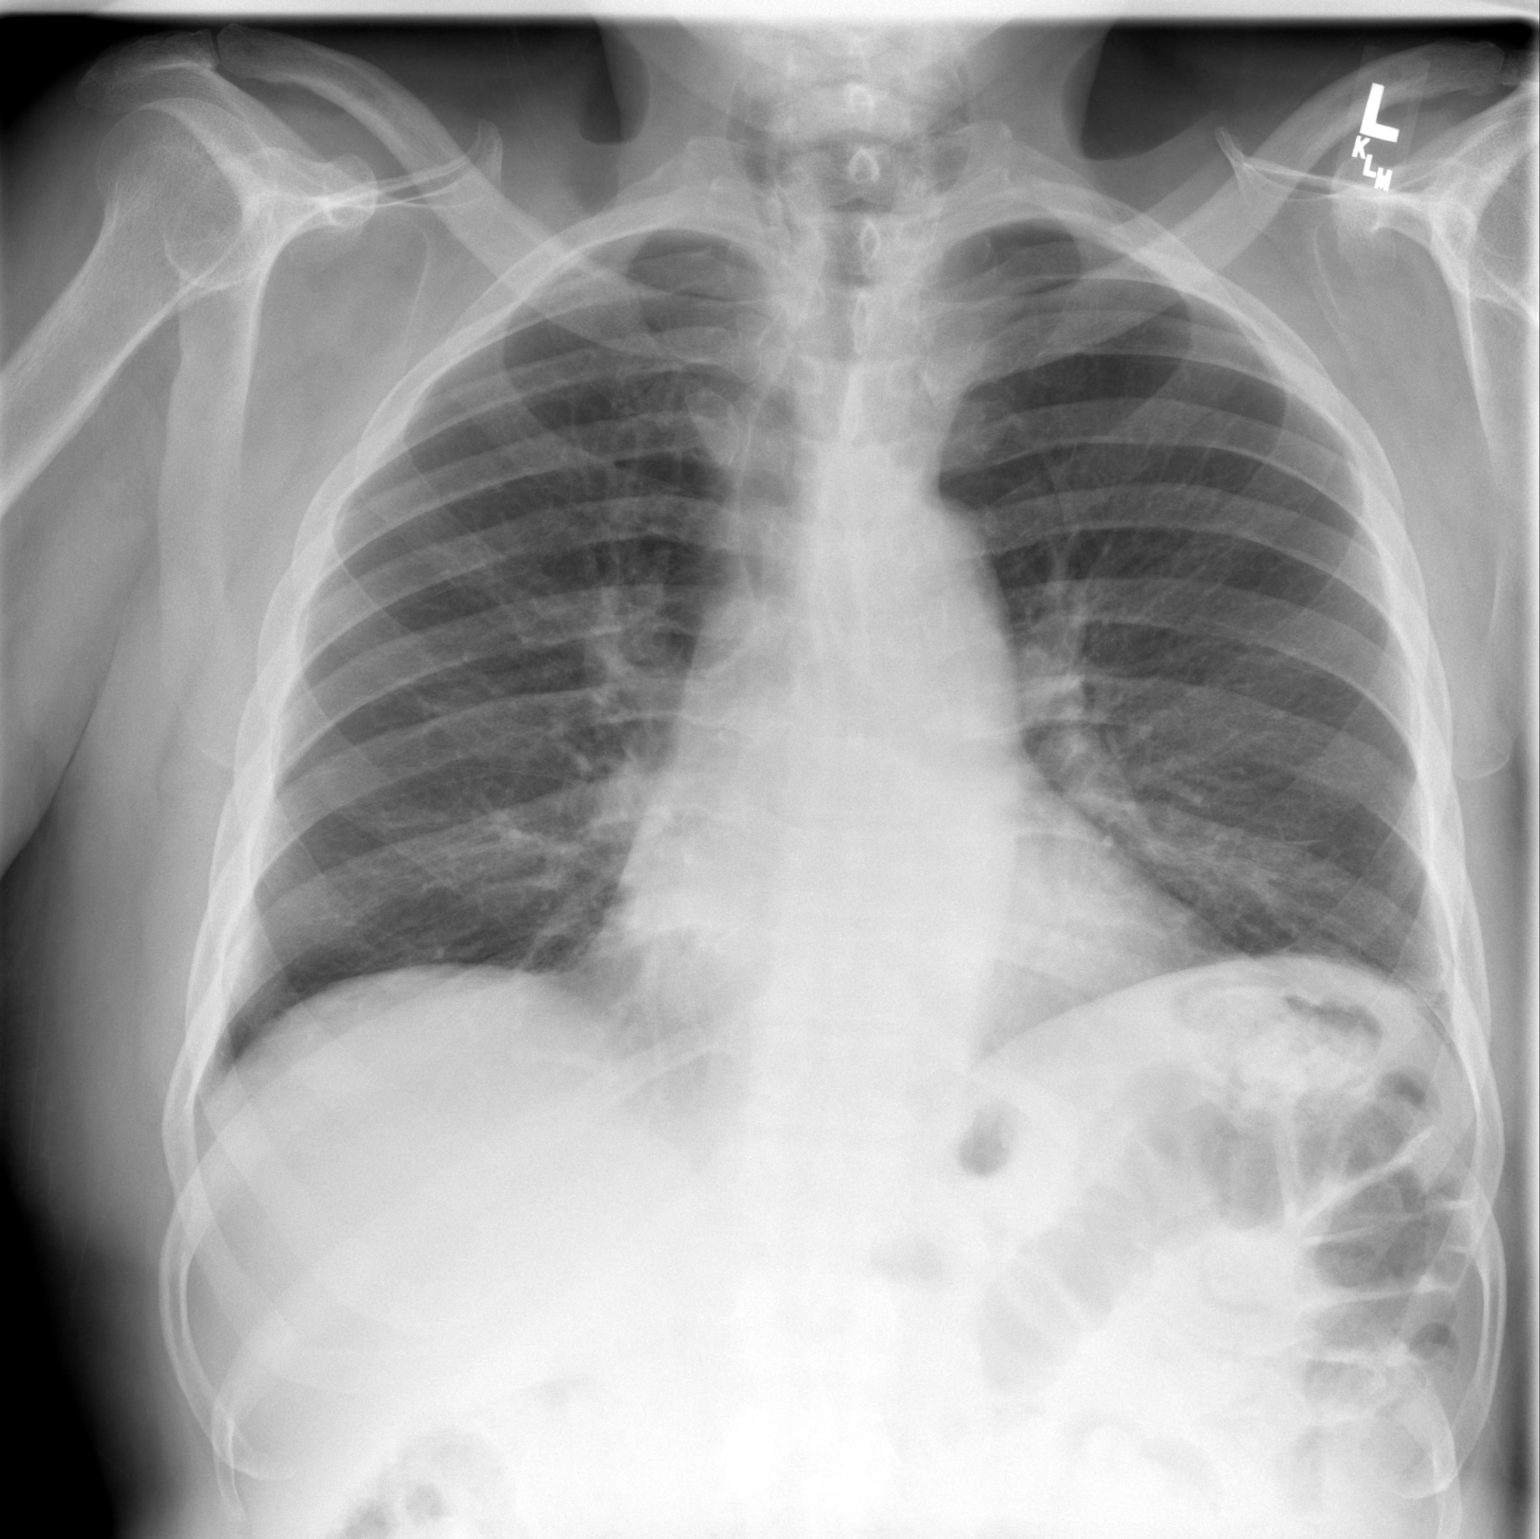

[w chest lat]
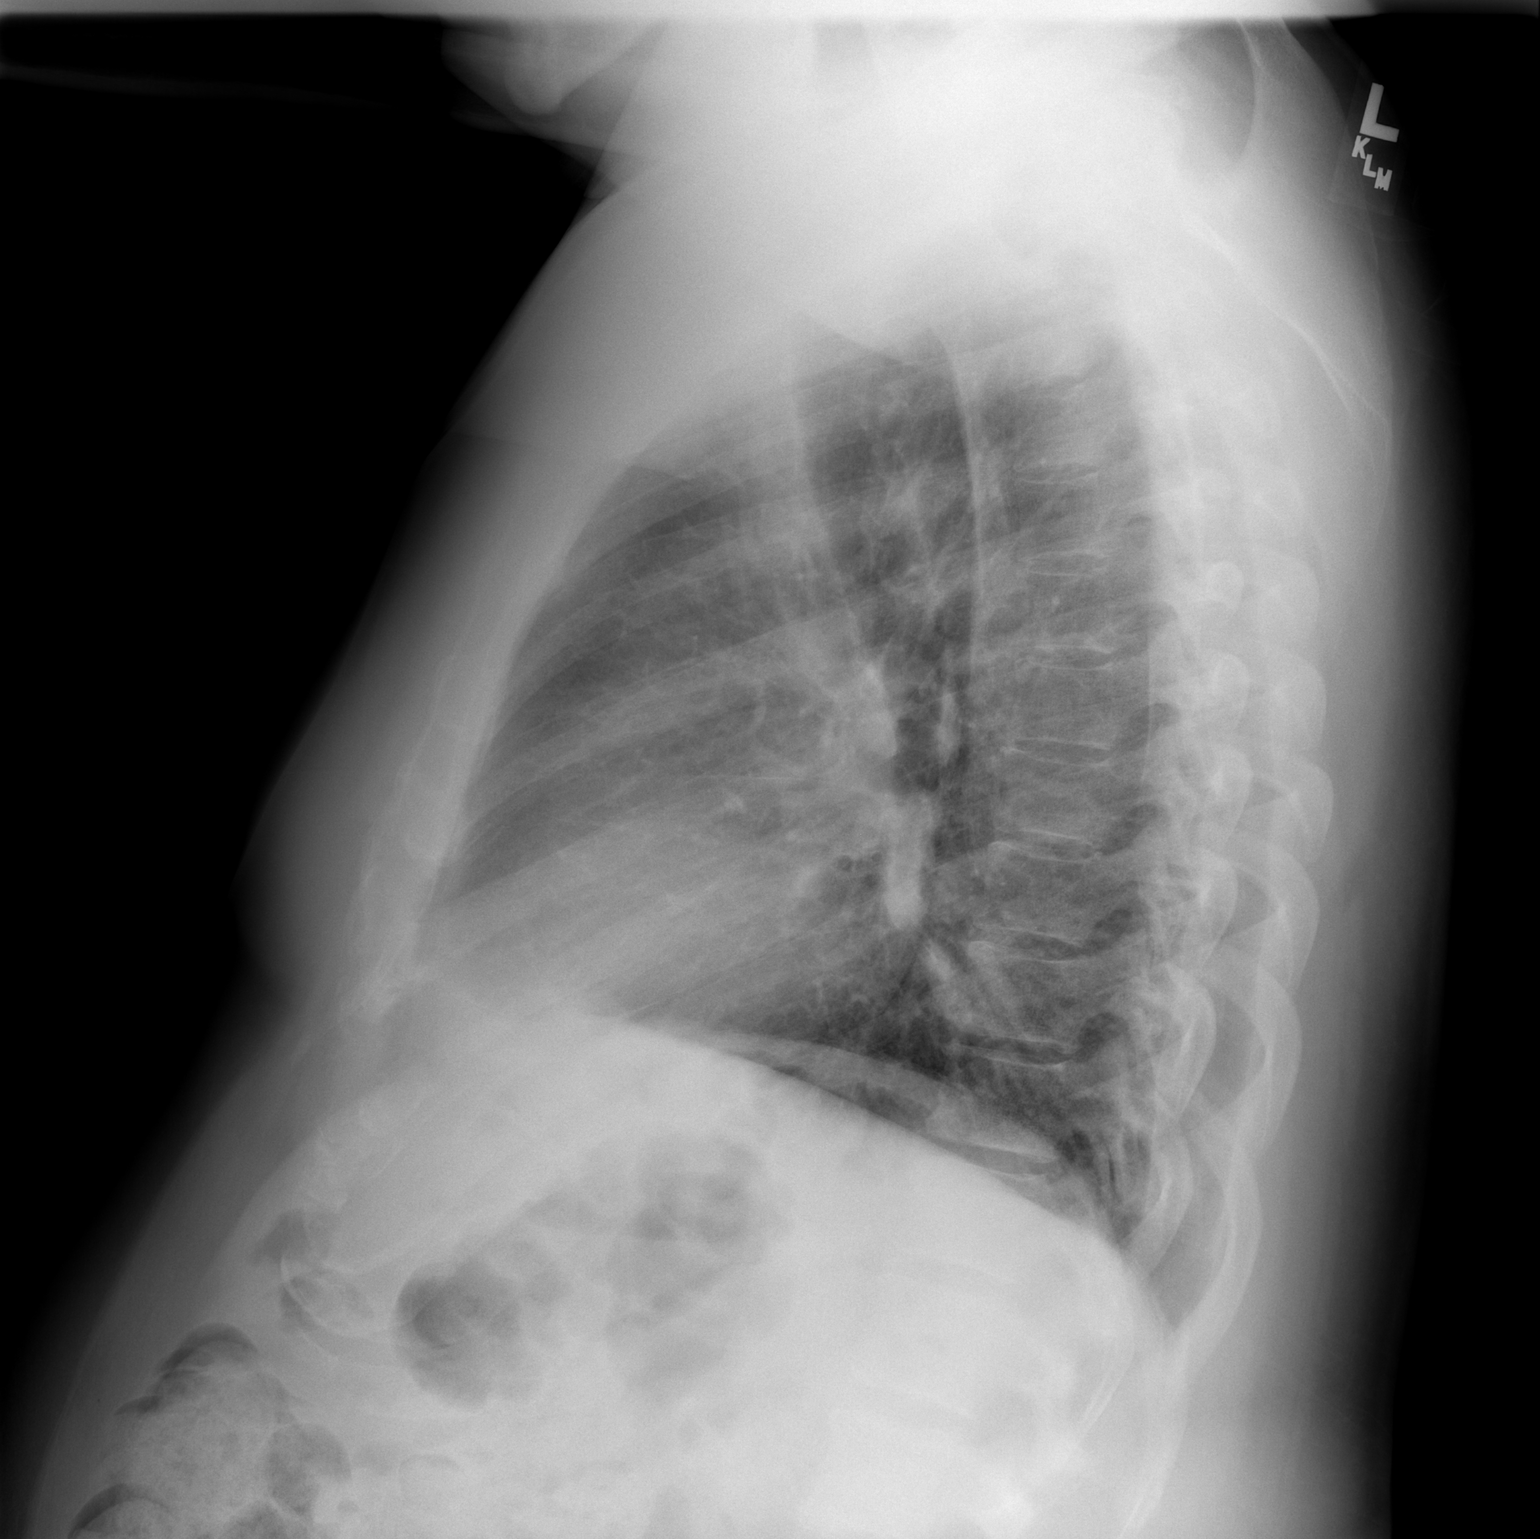

[2 of 2 positions shown; findings below may reference images not displayed]

FINDINGS: Somewhat low lung volumes.  Cardiac size and mediastinal
contours are within normal limits.  Visualized tracheal air column
is within normal limits.  No pneumothorax, pulmonary edema, pleural
effusion or confluent pulmonary opacity. No acute osseous
abnormality identified.    Negative visualized bowel gas pattern.
IMPRESSION: No acute cardiopulmonary abnormality.

## 2012-11-21 ENCOUNTER — Ambulatory Visit: Payer: Medicare Other | Admitting: Cardiology

## 2012-12-06 ENCOUNTER — Ambulatory Visit (INDEPENDENT_AMBULATORY_CARE_PROVIDER_SITE_OTHER): Payer: Medicare Other | Admitting: Cardiology

## 2012-12-06 ENCOUNTER — Encounter: Payer: Self-pay | Admitting: Cardiology

## 2012-12-06 VITALS — BP 132/72 | HR 91 | Ht 71.5 in | Wt 229.4 lb

## 2012-12-06 DIAGNOSIS — I10 Essential (primary) hypertension: Secondary | ICD-10-CM

## 2012-12-06 DIAGNOSIS — E785 Hyperlipidemia, unspecified: Secondary | ICD-10-CM

## 2012-12-06 DIAGNOSIS — I491 Atrial premature depolarization: Secondary | ICD-10-CM

## 2012-12-06 LAB — HEPATIC FUNCTION PANEL
ALT: 23 U/L (ref 0–53)
AST: 22 U/L (ref 0–37)
Bilirubin, Direct: 0 mg/dL (ref 0.0–0.3)
Total Bilirubin: 0.5 mg/dL (ref 0.3–1.2)

## 2012-12-06 LAB — BASIC METABOLIC PANEL
BUN: 22 mg/dL (ref 6–23)
GFR: 64.59 mL/min (ref >60.00)
Glucose, Bld: 73 mg/dL (ref 70–99)
Potassium: 3.8 mEq/L (ref 3.5–5.1)

## 2012-12-06 LAB — LIPID PANEL: VLDL: 11.8 mg/dL (ref 0.0–40.0)

## 2012-12-06 NOTE — Progress Notes (Signed)
Quick Note:  Please report to patient. The recent labs are stable. Continue same medication and careful diet. ______ 

## 2012-12-06 NOTE — Patient Instructions (Addendum)
Your physician recommends that you continue on your current medications as directed. Please refer to the Current Medication list given to you today.   Your physician recommends that you have lab work today: lp/hfp/bmet will call with the results   Your physician recommends that you keep your follow-up appointment on Friday, September 5th @ 8:45 am

## 2012-12-06 NOTE — Assessment & Plan Note (Signed)
Blood work was drawn today and is pending.  His weight is up 4 pounds since last visit.  He is trying to make an effort to lose weight and to increase aerobic exercise.

## 2012-12-06 NOTE — Assessment & Plan Note (Signed)
Blood pressure was remaining stable on current therapy 

## 2012-12-06 NOTE — Assessment & Plan Note (Signed)
His diabetes is followed by Dr. Lucianne Muss.  The patient has not been experiencing any hypoglycemic episodes.

## 2012-12-06 NOTE — Progress Notes (Signed)
Jeremy Jennings Date of Birth:  January 13, 1936 [facility] [address] [place], [place]  [address] [phone]         Fax   [phone]  History of Present Illness: This pleasant 77 year old [place] American gentleman is seen for a scheduled followup office visit. He has a past history of diabetes, essential hypertension, and exogenous obesity. He does not have any history of known ischemic heart disease. He had a normal treadmill Cardiolite stress test in 2005. He has had a history of mild cardiomegaly and an echocardiogram in 2007 showed mild LVH with normal systolic function and with diastolic dysfunction. The patient gets some exercise. He enjoys gardening. . Also he has had an ongoing problem with bursitis of his left shoulder and has been seeing a chiropractor. His shoulder problem has improved since last visit Since last visit he is in not been aware of any new cardiac symptoms. He has not been aware of any palpitations.  In April 2014 he had cataract surgery on his right eye.  He has had a reasonable good result but is still having some speckles in front of his eye and is now also seeing a retinal specialist.  There is been no evidence of any diabetic retinopathy.   Current Outpatient Prescriptions  Medication Sig Dispense Refill  . aspirin 500 MG EC tablet Take 500 mg by mouth every 6 (six) hours as needed.        Marland Kitchen atorvastatin (LIPITOR) 40 MG tablet Take 40 mg by mouth daily.        . Cholecalciferol (VITAMIN D PO) Take by mouth daily. Taking 5000 daily      . diltiazem (TIAZAC) 360 MG 24 hr capsule Take 360 mg by mouth daily.        Marland Kitchen FREESTYLE LITE test strip       . glipiZIDE (GLUCOTROL) 2.5 MG 24 hr tablet Take 2.5 mg by mouth daily.        . hydrochlorothiazide (HYDRODIURIL) 12.5 MG tablet Take 12.5 mg by mouth daily.        . insulin aspart (NOVOLOG) 100 UNIT/ML injection Inject 0-7 Units into the skin 3 (three) times daily before meals. Sliding scale        . latanoprost (XALATAN) 0.005 % ophthalmic solution Place 0.005 % into both eyes as directed.      . Liraglutide (VICTOZA) 18 MG/3ML SOLN Inject into the skin as directed.       . metFORMIN (GLUCOPHAGE) 500 MG tablet Take 500 mg by mouth 2 (two) times daily with a meal.        . pioglitazone (ACTOS) 30 MG tablet Take 30 mg by mouth daily.        Danie Chandler HCT 150-12.5 MG TABS Daily.       No current facility-administered medications for this visit.    No Known Allergies  Patient Active Problem List   Diagnosis Date Noted  . PAC (premature atrial contraction) 07/24/2012  . Bursitis of left shoulder 07/15/2011  . Hyperlipidemia   . Hypertension   . Type II or unspecified type diabetes mellitus without mention of complication, uncontrolled     History  Smoking status  . Former Smoker -- 0.30 packs/day for 20 years  . Types: Cigarettes  . Quit date: 07/12/1979  Smokeless tobacco  . Never Used    History  Alcohol Use No    No family history on file.  Review of Systems: Constitutional: no fever chills diaphoresis  or fatigue or change in weight.  Head and neck: no hearing loss, no epistaxis, no photophobia or visual disturbance. Respiratory: No cough, shortness of breath or wheezing. Cardiovascular: No chest pain peripheral edema, palpitations. Gastrointestinal: No abdominal distention, no abdominal pain, no change in bowel habits hematochezia or melena. Genitourinary: No dysuria, no frequency, no urgency, no nocturia. Musculoskeletal:No arthralgias, no back pain, no gait disturbance or myalgias. Neurological: No dizziness, no headaches, no numbness, no seizures, no syncope, no weakness, no tremors. Hematologic: No lymphadenopathy, no easy bruising. Psychiatric: No confusion, no hallucinations, no sleep disturbance.    Physical Exam: Filed Vitals:   12/06/12 1419  BP: 132/72  Pulse: 91   The general appearance reveals a somewhat overweight gentleman in no  distress.The head and neck exam reveals pupils equal and reactive.  Extraocular movements are full.  There is no scleral icterus.  The mouth and pharynx are normal.  The neck is supple.  The carotids reveal no bruits.  The jugular venous pressure is normal.  The  thyroid is not enlarged.  There is no lymphadenopathy.  The chest is clear to percussion and auscultation.  There are no rales or rhonchi.  Expansion of the chest is symmetrical.  The precordium is quiet.  There are frequent premature atrial beats. The first heart sound is normal.  The second heart sound is physiologically split.  There is no murmur gallop rub or click.  There is no abnormal lift or heave.  The abdomen is soft and nontender.  The bowel sounds are normal.  The liver and spleen are not enlarged.  There are no abdominal masses.  There are no abdominal bruits.  Extremities reveal good pedal pulses.  There is no phlebitis or edema.  There is no cyanosis or clubbing.  Strength is normal and symmetrical in all extremities.  Left shoulder range of motion is improved. There is no lateralizing weakness.  There are no sensory deficits.  The skin is warm and dry.  There is no rash.  EKG today shows sinus rhythm with frequent PACs.  No ischemic changes.  Assessment / Plan: Continue same medication.  Work harder on weight loss.  Recheck in 4 months for office visit lipid panel hepatic function panel and basal metabolic panel

## 2012-12-07 ENCOUNTER — Telehealth: Payer: Self-pay | Admitting: *Deleted

## 2012-12-07 NOTE — Telephone Encounter (Signed)
Message copied by Burnell Blanks on Fri Dec 07, 2012 10:21 AM ------      Message from: Cassell Clement      Created: Thu Dec 06, 2012  5:48 PM       Please report to patient.  The recent labs are stable. Continue same medication and careful diet. ------

## 2012-12-07 NOTE — Telephone Encounter (Signed)
Mailed copy of labs and left message to call if any questions  

## 2013-02-08 ENCOUNTER — Other Ambulatory Visit: Payer: Self-pay | Admitting: *Deleted

## 2013-02-08 MED ORDER — METFORMIN HCL 500 MG PO TABS: 500.0000 mg | ORAL_TABLET | Freq: Two times a day (BID) | ORAL | Status: DC

## 2013-03-04 ENCOUNTER — Other Ambulatory Visit: Payer: Self-pay | Admitting: *Deleted

## 2013-03-04 DIAGNOSIS — E785 Hyperlipidemia, unspecified: Secondary | ICD-10-CM

## 2013-03-12 ENCOUNTER — Other Ambulatory Visit (INDEPENDENT_AMBULATORY_CARE_PROVIDER_SITE_OTHER): Payer: Medicare Other

## 2013-03-12 DIAGNOSIS — IMO0001 Reserved for inherently not codable concepts without codable children: Secondary | ICD-10-CM

## 2013-03-12 DIAGNOSIS — E785 Hyperlipidemia, unspecified: Secondary | ICD-10-CM

## 2013-03-12 LAB — COMPREHENSIVE METABOLIC PANEL
ALT: 20 U/L (ref 0–53)
CO2: 28 mEq/L (ref 19–32)
Creatinine, Ser: 1.4 mg/dL (ref 0.4–1.5)
GFR: 62.45 mL/min (ref >60.00)
Total Bilirubin: 0.4 mg/dL (ref 0.3–1.2)

## 2013-03-12 LAB — LIPID PANEL
HDL: 45.2 mg/dL (ref >39.00)
VLDL: 15 mg/dL (ref 0.0–40.0)

## 2013-03-12 LAB — HEMOGLOBIN A1C: Hgb A1c MFr Bld: 8.3 % — ABNORMAL HIGH (ref 4.6–6.5)

## 2013-03-13 LAB — URINALYSIS
Ketones, ur: NEGATIVE
Leukocytes, UA: NEGATIVE
Nitrite: NEGATIVE
Specific Gravity, Urine: 1.015 (ref 1.000–1.030)
pH: 6.5 (ref 5.0–8.0)

## 2013-03-13 LAB — MICROALBUMIN / CREATININE URINE RATIO
Creatinine,U: 133.3 mg/dL
Microalb Creat Ratio: 2.1 mg/g (ref 0.0–30.0)
Microalb, Ur: 2.8 mg/dL — ABNORMAL HIGH (ref 0.0–1.9)

## 2013-03-14 ENCOUNTER — Encounter: Payer: Self-pay | Admitting: Endocrinology

## 2013-03-14 ENCOUNTER — Ambulatory Visit (INDEPENDENT_AMBULATORY_CARE_PROVIDER_SITE_OTHER): Payer: Medicare Other | Admitting: Endocrinology

## 2013-03-14 VITALS — BP 116/64 | HR 91 | Temp 98.3°F | Resp 12 | Ht 71.0 in | Wt 228.9 lb

## 2013-03-14 NOTE — Patient Instructions (Addendum)
Victoza 1.8 mg daily  More exercise; may use upto 11 Novolog  Call if am stays > 140   Metformin 2 in pm

## 2013-03-14 NOTE — Progress Notes (Signed)
Patient ID: Jeremy Jennings, male   DOB: 03/09/1936, 77 y.o.   MRN: [id number]  Jeremy Jennings is an 77 y.o. male.   Reason for Appointment: Diabetes follow-up   History of Present Illness   Diagnosis: Type 2 DIABETES MELITUS, date of diagnosis:  1981        Jeremy Jennings has had long-standing diabetes treated with multiple drugs over the years and still continues to take a multidrug regimen Jeremy Jennings was started on Victoza in 2010 somewhat better diabetes control and better weight For some reason Jeremy Jennings is only taking a little more than 1.2 mg of Victoza instead of 1.8 However because of tendency to high postprandial readings and higher A1c was also given mealtime coverage with NovoLog about 2 years ago Jeremy Jennings continues to take Actos and glipizide which Jeremy Jennings has taken for a long time  Oral hypoglycemic drugs: Actos, metformin, glipizide        Side effects from medications: None Insulin regimen: Jeremy Jennings is using 7-9 before meals of NovoLog based on blood sugar and meal size         Proper timing of medications in relation to meals: Yes.         Monitors blood glucose: Once a day.    Glucometer: One Touch.          Blood Glucose readings from meter download: readings before breakfast: 112-206 with average about 165, afternoon 102-263, Pcs 81-152, overall average 154 Hypoglycemia frequency:  only once with a glucose of 69 before supper .          Meals: 3 meals per day.          Physical activity: exercise: Some farming and outside activities, no regular programmed exercise           Dietician visit: Most recent: 7/12           Complications: are: None    The last HbgA1c was reported as 7.2    Wt Readings from Last 3 Encounters:  03/14/13 228 lb 14.4 oz (103.828 kg)  12/06/12 229 lb 6.4 oz (104.055 kg)  07/24/12 225 lb 12.8 oz (102.422 kg)    Appointment on 03/12/2013  Component Date Value Range Status  . Cholesterol 03/12/2013 118  0 - 200 mg/dL Final   ATP III Classification       Desirable:  < 200 mg/dL                Borderline High:  200 - 239 mg/dL          High:  > = 725 mg/dL  . Triglycerides 03/12/2013 75.0  0.0 - 149.0 mg/dL Final   Normal:  <366 mg/dLBorderline High:  150 - 199 mg/dL  . HDL 03/12/2013 45.20  >39.00 mg/dL Final  . VLDL 44/09/4740 15.0  0.0 - 40.0 mg/dL Final  . LDL Cholesterol 03/12/2013 58  0 - 99 mg/dL Final  . Total CHOL/HDL Ratio 03/12/2013 3   Final                  Men          Women1/2 Average Risk     3.4          3.3Average Risk          5.0          4.42X Average Risk          9.6          7.13X Average Risk  15.0          11.0                      . Hemoglobin A1C 03/12/2013 8.3* 4.6 - 6.5 % Final   Glycemic Control Guidelines for People with Diabetes:Non Diabetic:  <6%Goal of Therapy: <7%Additional Action Suggested:  >8%   . Sodium 03/12/2013 137  135 - 145 mEq/L Final  . Potassium 03/12/2013 4.1  3.5 - 5.1 mEq/L Final  . Chloride 03/12/2013 105  96 - 112 mEq/L Final  . CO2 03/12/2013 28  19 - 32 mEq/L Final  . Glucose, Bld 03/12/2013 96  70 - 99 mg/dL Final  . BUN 40/98/1191 25* 6 - 23 mg/dL Final  . Creatinine, Ser 03/12/2013 1.4  0.4 - 1.5 mg/dL Final  . Total Bilirubin 03/12/2013 0.4  0.3 - 1.2 mg/dL Final  . Alkaline Phosphatase 03/12/2013 78  39 - 117 U/L Final  . AST 03/12/2013 21  0 - 37 U/L Final  . ALT 03/12/2013 20  0 - 53 U/L Final  . Total Protein 03/12/2013 6.8  6.0 - 8.3 g/dL Final  . Albumin 47/82/9562 3.4* 3.5 - 5.2 g/dL Final  . Calcium 13/02/6577 9.1  8.4 - 10.5 mg/dL Final  . GFR 46/96/2952 62.45  >60.00 mL/min Final  . Color, Urine 03/12/2013 LT. YELLOW  Yellow;Lt. Yellow Final  . APPearance 03/12/2013 CLEAR  Clear Final  . Specific Gravity, Urine 03/12/2013 1.015  1.000-1.030 Final  . pH 03/12/2013 6.5  5.0 - 8.0 Final  . Total Protein, Urine 03/12/2013 NEGATIVE  Negative Final  . Urine Glucose 03/12/2013 NEGATIVE  Negative Final  . Ketones, ur 03/12/2013 NEGATIVE  Negative Final  . Bilirubin Urine 03/12/2013 NEGATIVE   Negative Final  . Hgb urine dipstick 03/12/2013 NEGATIVE  Negative Final  . Urobilinogen, UA 03/12/2013 0.2  0.0 - 1.0 Final  . Leukocytes, UA 03/12/2013 NEGATIVE  Negative Final  . Nitrite 03/12/2013 NEGATIVE  Negative Final  . Microalb, Ur 03/12/2013 2.8* 0.0 - 1.9 mg/dL Final  . Creatinine,U 84/13/2440 133.3   Final  . Microalb Creat Ratio 03/12/2013 2.1  0.0 - 30.0 mg/g Final      Medication List       This list is accurate as of: 03/14/13  9:54 AM.  Always use your most recent med list.               aspirin 500 MG EC tablet  Take 500 mg by mouth every 6 (six) hours as needed.     atorvastatin 40 MG tablet  Commonly known as:  LIPITOR  Take 40 mg by mouth daily.     diltiazem 360 MG 24 hr capsule  Commonly known as:  TIAZAC  Take 360 mg by mouth daily.     FREESTYLE LITE test strip  Generic drug:  glucose blood     glipiZIDE 2.5 MG 24 hr tablet  Commonly known as:  GLUCOTROL XL  Take 2.5 mg by mouth daily.     hydrochlorothiazide 12.5 MG tablet  Commonly known as:  HYDRODIURIL  Take 12.5 mg by mouth daily.     insulin aspart 100 UNIT/ML injection  Commonly known as:  novoLOG  Inject 0-9 Units into the skin 3 (three) times daily before meals. Sliding scale     latanoprost 0.005 % ophthalmic solution  Commonly known as:  XALATAN  Place 0.005 % into both eyes as directed.     metFORMIN 500  MG tablet  Commonly known as:  GLUCOPHAGE  Take 1 tablet (500 mg total) by mouth 2 (two) times daily with a meal.     pioglitazone 30 MG tablet  Commonly known as:  ACTOS  Take 30 mg by mouth daily.     TEKTURNA HCT 150-12.5 MG Tabs  Generic drug:  Aliskiren-Hydrochlorothiazide  Daily.     VICTOZA 18 MG/3ML Soln injection  Generic drug:  Liraglutide  Inject 1.8 mg into the skin as directed.     VITAMIN D PO  Take by mouth daily. Taking 5000 daily        Allergies: No Known Allergies  Past Medical History  Diagnosis Date  . Hypertension   . Diabetes  mellitus   . Heart murmur     SYSTOLIC MURMUR AT BASE  . Hyperlipidemia     Past Surgical History  Procedure Laterality Date  . Tonsillectomy      IN [facility]  . Hemorroidectomy  1969  . Hernia repair  AFE 18-17  . Hernia repair  1981    DR WEATHERLY    No family history on file.  Social History:  reports that Jeremy Jennings quit smoking about 33 years ago. His smoking use included Cigarettes. Jeremy Jennings has a 6 pack-year smoking history. Jeremy Jennings has never used smokeless tobacco. Jeremy Jennings reports that Jeremy Jennings does not drink alcohol or use illicit drugs.  Review of Systems:  HYPERTENSION:  well controlled now  HYPERLIPIDEMIA: The lipid abnormality consists of elevated LDL, on Lipitor long-term.   no history of numbness or tingling in his feet     Examination:   BP 116/64  Pulse 91  Temp(Src) 98.3 F (36.8 C)  Resp 12  Ht 5\' 11"  (1.803 m)  Wt 228 lb 14.4 oz (103.828 kg)  BMI 31.94 kg/m2  SpO2 93%  Body mass index is 31.94 kg/(m^2).   Feet are normal to inspection  ASSESSMENT/ PLAN::   Diabetes type 2   The patient's diabetes control appears to be worse with A1c now over 8% Jeremy Jennings is not checking a lot of blood sugars after meals but generally appears to be high after lunch Again Jeremy Jennings is blaming this on eating out with traveling frequently and is not able to watch his diet consistently However his fasting readings appear to be relatively high even though none other readings recently after supper are high Jeremy Jennings does not exercise consistently  Will make the following changes to his regimen: Discussed watching his diet better especially when eating out with reduced portions and fats Increase exercise with regular walking Since renal function is better with increase evening metformin by 1 tablet Jeremy Jennings will increase her Victoza the 1.8 mg Increase NovoLog by 2 units for lunch and check more readings after breakfast Call if blood sugar not improved, consider adding low dose at bedtime Levemir  HYPERTENSION: Continue  same medications and also followup with cardiologist  Counseling time over 50% of today's 25 minute visit  Kirat Mezquita 03/14/2013, 9:54 AM

## 2013-03-15 ENCOUNTER — Ambulatory Visit (INDEPENDENT_AMBULATORY_CARE_PROVIDER_SITE_OTHER): Payer: Medicare Other | Admitting: Cardiology

## 2013-03-15 ENCOUNTER — Encounter: Payer: Self-pay | Admitting: Cardiology

## 2013-03-15 VITALS — BP 132/62 | HR 92 | Ht 71.0 in | Wt 229.0 lb

## 2013-03-15 DIAGNOSIS — I491 Atrial premature depolarization: Secondary | ICD-10-CM

## 2013-03-15 DIAGNOSIS — I10 Essential (primary) hypertension: Secondary | ICD-10-CM

## 2013-03-15 DIAGNOSIS — E7800 Pure hypercholesterolemia, unspecified: Secondary | ICD-10-CM

## 2013-03-15 DIAGNOSIS — I119 Hypertensive heart disease without heart failure: Secondary | ICD-10-CM

## 2013-03-15 DIAGNOSIS — E785 Hyperlipidemia, unspecified: Secondary | ICD-10-CM

## 2013-03-15 LAB — LIPID PANEL
HDL: 44.7 mg/dL (ref >39.00)
LDL Cholesterol: 58 mg/dL (ref 0–99)
Total CHOL/HDL Ratio: 3
VLDL: 15 mg/dL (ref 0.0–40.0)

## 2013-03-15 LAB — BASIC METABOLIC PANEL
BUN: 26 mg/dL — ABNORMAL HIGH (ref 6–23)
CO2: 28 mEq/L (ref 19–32)
Chloride: 101 mEq/L (ref 96–112)
GFR: 66.77 mL/min (ref >60.00)
Glucose, Bld: 114 mg/dL — ABNORMAL HIGH (ref 70–99)
Potassium: 4.1 mEq/L (ref 3.5–5.1)
Sodium: 136 mEq/L (ref 135–145)

## 2013-03-15 LAB — HEPATIC FUNCTION PANEL
Bilirubin, Direct: 0 mg/dL (ref 0.0–0.3)
Total Bilirubin: 0.6 mg/dL (ref 0.3–1.2)

## 2013-03-15 NOTE — Progress Notes (Signed)
Jeremy Jennings Date of Birth:  [birth year] [facility] [address] [name], [place]  [address] [phone]         Fax   [phone]  History of Present Illness: This pleasant 77 year old [place] American gentleman is seen for a scheduled followup office visit. He has a past history of diabetes, essential hypertension, and exogenous obesity. He does not have any history of known ischemic heart disease. He had a normal treadmill Cardiolite stress test in 2005. He has had a history of mild cardiomegaly and an echocardiogram in 2007 showed mild LVH with normal systolic function and with diastolic dysfunction. The patient gets some exercise. He enjoys gardening. . Also he has had an ongoing problem with bursitis of his left shoulder and has been seeing a chiropractor. His shoulder problem has improved since last visit Since last visit he is in not been aware of any new cardiac symptoms. He has not been aware of any palpitations. In April 2014 he had cataract surgery on his right eye. He has had a reasonable good result but is still having some speckles in front of his eye and is now also seeing a retinal specialist. There is been no evidence of any diabetic retinopathy.   Current Outpatient Prescriptions  Medication Sig Dispense Refill  . aspirin 500 MG EC tablet Take 500 mg by mouth every 6 (six) hours as needed.        Marland Kitchen atorvastatin (LIPITOR) 40 MG tablet Take 40 mg by mouth daily.        . Cholecalciferol (VITAMIN D PO) Take by mouth daily. Taking 5000 daily      . diltiazem (TIAZAC) 360 MG 24 hr capsule Take 360 mg by mouth daily.        Marland Kitchen FREESTYLE LITE test strip       . glipiZIDE (GLUCOTROL) 2.5 MG 24 hr tablet Take 2.5 mg by mouth daily.        . hydrochlorothiazide (HYDRODIURIL) 12.5 MG tablet Take 12.5 mg by mouth daily.        . insulin aspart (NOVOLOG) 100 UNIT/ML injection Inject 0-9 Units into the skin 3 (three) times daily before meals. Sliding scale      .  latanoprost (XALATAN) 0.005 % ophthalmic solution Place 0.005 % into both eyes as directed.      . Liraglutide (VICTOZA) 18 MG/3ML SOLN Inject 1.8 mg into the skin as directed.       . metFORMIN (GLUCOPHAGE) 500 MG tablet Take 500 mg by mouth 3 (three) times daily.      . pioglitazone (ACTOS) 30 MG tablet Take 30 mg by mouth daily.        Jeremy Jennings HCT 150-12.5 MG TABS Daily.       No current facility-administered medications for this visit.    No Known Allergies  Patient Active Problem List   Diagnosis Date Noted  . PAC (premature atrial contraction) 07/24/2012  . Bursitis of left shoulder 07/15/2011  . Hyperlipidemia   . Hypertension   . Type II or unspecified type diabetes mellitus without mention of complication, uncontrolled     History  Smoking status  . Former Smoker -- 0.30 packs/day for 20 years  . Types: Cigarettes  . Quit date: 07/12/1979  Smokeless tobacco  . Never Used    History  Alcohol Use No    No family history on file.  Review of Systems: Constitutional: no fever chills diaphoresis or fatigue or change in weight.  Head and neck: no hearing loss, no epistaxis, no photophobia or visual disturbance. Respiratory: No cough, shortness of breath or wheezing. Cardiovascular: No chest pain peripheral edema, palpitations. Gastrointestinal: No abdominal distention, no abdominal pain, no change in bowel habits hematochezia or melena. Genitourinary: No dysuria, no frequency, no urgency, no nocturia. Musculoskeletal:No arthralgias, no back pain, no gait disturbance or myalgias. Neurological: No dizziness, no headaches, no numbness, no seizures, no syncope, no weakness, no tremors. Hematologic: No lymphadenopathy, no easy bruising. Psychiatric: No confusion, no hallucinations, no sleep disturbance.    Physical Exam: Filed Vitals:   03/15/13 0929  BP: 132/62  Pulse: 92   the general appearance reveals a well-developed well-nourished elderly gentleman in no  distress.The head and neck exam reveals pupils equal and reactive.  Extraocular movements are full.  There is no scleral icterus.  The mouth and pharynx are normal.  The neck is supple.  The carotids reveal no bruits.  The jugular venous pressure is normal.  The  thyroid is not enlarged.  There is no lymphadenopathy.  The chest is clear to percussion and auscultation.  There are no rales or rhonchi.  Expansion of the chest is symmetrical.  The precordium is quiet.  The first heart sound is normal.  The second heart sound is physiologically split.  There is no murmur gallop rub or click.  There is no abnormal lift or heave.  The abdomen is soft and nontender.  The bowel sounds are normal.  The liver and spleen are not enlarged.  There are no abdominal masses.  There are no abdominal bruits.  Extremities reveal good pedal pulses.  There is no phlebitis or edema.  There is no cyanosis or clubbing.  Strength is normal and symmetrical in all extremities.  There is no lateralizing weakness.  There are no sensory deficits.  The skin is warm and dry.  There is no rash.     Assessment / Plan: The patient is to continue same medication.  Recheck in 4 months for office visit EKG lipid panel hepatic function panel and basal metabolic panel.

## 2013-03-15 NOTE — Assessment & Plan Note (Signed)
The patient has a history of premature atrial beats.  He has not had any history of atrial fibrillation.  He is not having any sustained palpitations.  No symptoms of CHF.  No dizziness or syncope.

## 2013-03-15 NOTE — Patient Instructions (Addendum)

## 2013-03-15 NOTE — Assessment & Plan Note (Signed)
The patient has a history of dyslipidemia and is on Lipitor 40 mg daily.  Is not having any myalgias.  We will check fasting lab work today.

## 2013-03-15 NOTE — Progress Notes (Signed)
Quick Note:  Please report to patient. The recent labs are stable. Continue same medication and careful diet. ______ 

## 2013-03-15 NOTE — Assessment & Plan Note (Signed)
Blood pressure was remaining stable on current therapy.  No headaches dizziness or syncope. 

## 2013-03-15 NOTE — Assessment & Plan Note (Signed)
The patient saw Dr. Lucianne Muss yesterday regarding his diabetes.  His metformin was increased to 3 times a day.

## 2013-03-18 ENCOUNTER — Telehealth: Payer: Self-pay | Admitting: *Deleted

## 2013-03-18 NOTE — Telephone Encounter (Signed)
Message copied by Burnell Blanks on Mon Mar 18, 2013  9:16 AM ------      Message from: Cassell Clement      Created: Fri Mar 15, 2013  7:42 PM       Please report to patient.  The recent labs are stable. Continue same medication and careful diet. ------

## 2013-03-18 NOTE — Telephone Encounter (Signed)
Advised patient of lab results  

## 2013-04-09 ENCOUNTER — Encounter: Payer: Self-pay | Admitting: Cardiology

## 2013-04-10 ENCOUNTER — Other Ambulatory Visit: Payer: Self-pay | Admitting: *Deleted

## 2013-04-10 MED ORDER — INSULIN PEN NEEDLE 31G X 5 MM MISC: 1.0000 | Freq: Two times a day (BID) | Status: DC

## 2013-04-25 ENCOUNTER — Other Ambulatory Visit: Payer: Self-pay | Admitting: *Deleted

## 2013-04-25 MED ORDER — METFORMIN HCL 500 MG PO TABS: 500.0000 mg | ORAL_TABLET | Freq: Three times a day (TID) | ORAL | Status: DC

## 2013-04-26 ENCOUNTER — Other Ambulatory Visit: Payer: Self-pay | Admitting: *Deleted

## 2013-04-26 MED ORDER — LIRAGLUTIDE 18 MG/3ML ~~LOC~~ SOPN: 1.8000 mg | PEN_INJECTOR | Freq: Every day | SUBCUTANEOUS | Status: DC

## 2013-04-29 ENCOUNTER — Other Ambulatory Visit (INDEPENDENT_AMBULATORY_CARE_PROVIDER_SITE_OTHER): Payer: Medicare Other

## 2013-04-29 LAB — BASIC METABOLIC PANEL
BUN: 23 mg/dL (ref 6–23)
Calcium: 9.3 mg/dL (ref 8.4–10.5)
GFR: 63.46 mL/min (ref >60.00)
Potassium: 4.9 mEq/L (ref 3.5–5.1)
Sodium: 139 mEq/L (ref 135–145)

## 2013-04-30 LAB — FRUCTOSAMINE: Fructosamine: 267 umol/L (ref <285)

## 2013-05-02 ENCOUNTER — Encounter: Payer: Self-pay | Admitting: Endocrinology

## 2013-05-02 ENCOUNTER — Ambulatory Visit (INDEPENDENT_AMBULATORY_CARE_PROVIDER_SITE_OTHER): Payer: Medicare Other | Admitting: Endocrinology

## 2013-05-02 VITALS — BP 112/52 | HR 65 | Temp 98.5°F | Resp 12 | Ht 71.0 in | Wt 226.5 lb

## 2013-05-02 NOTE — Progress Notes (Signed)
Patient ID: Jeremy Jennings, male   DOB: 1936-03-17, 77 y.o.   MRN: [id number]  Jeremy Jennings is an 77 y.o. male.   Reason for Appointment: Diabetes follow-up   History of Present Illness    Diagnosis: Type 2 DIABETES MELITUS, date of diagnosis:  1981        PREVIOUS history: He has had long-standing diabetes treated with multiple drugs over the years and still continues to take a multidrug regimen He was started on Victoza in 2010 somewhat better diabetes control and better weight However because of tendency to high postprandial readings and higher A1c was also given mealtime coverage with NovoLog about 2 years ago  RECENT history: Because of his A1c going up to 8.3 last month his Victoza was increased from 1.2 up to 1.8 mg Also because of his stable creatinine of 1.4 his metformin was increased to 1500 mg He continues to take Actos and glipizide which he has taken for a long time. No hypoglycemia Although he did not bring his monitor he thinks his blood sugars are improved Also has FRUCTOSAMINE level is in the normal range His weight has come down 3 pounds  Oral hypoglycemic drugs: Actos, metformin, glipizide        Side effects from medications: None Insulin regimen: He is using 7-10 before meals of NovoLog based on blood sugar and meal size         Proper timing of medications in relation to meals: Yes.         Monitors blood glucose: Once a day.    Glucometer: One Touch.          Blood Glucose readings from meter download: readings before breakfast:112-139 occasionally over 200 from a missed dose Hypoglycemia frequency:  only once with a glucose of 42 before supper .          Meals: 3 meals per day. 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 Sodium 04/29/2013 139  135 - 145 mEq/L Final  . Potassium 04/29/2013 4.9  3.5 - 5.1 mEq/L Final  . Chloride 04/29/2013 102  96 - 112 mEq/L Final  . CO2 04/29/2013 30  19 - 32 mEq/L Final  . Glucose, Bld 04/29/2013 93  70 - 99 mg/dL Final  . BUN 40/98/1191 23  6 - 23 mg/dL Final  . Creatinine, Ser 04/29/2013 1.4  0.4 - 1.5 mg/dL Final  . Calcium 47/82/9562 9.3  8.4 - 10.5 mg/dL Final  . GFR 13/02/6577 63.46  >60.00 mL/min Final  . Fructosamine 04/29/2013 267  <285 umol/L Final   Comment:  Variations in levels of serum proteins (albumin and immunoglobulins)                          may affect fructosamine results.                                 Medication List       This list is accurate as of: 05/02/13 10:01 AM.  Always use your most recent med list.               aspirin 500 MG EC tablet  Take 500 mg by mouth every 6 (six) hours as needed.     atorvastatin 40 MG tablet  Commonly known as:  LIPITOR  Take 40 mg by mouth daily.     COMBIGAN 0.2-0.5 % ophthalmic solution  Generic drug:  brimonidine-timolol     diltiazem 360 MG 24 hr capsule  Commonly known as:  TIAZAC  Take 360 mg by mouth daily.     FREESTYLE LITE test strip  Generic drug:  glucose blood     glipiZIDE 2.5 MG 24 hr tablet  Commonly known as:  GLUCOTROL XL  Take 2.5 mg by mouth daily.     hydrochlorothiazide 12.5 MG tablet  Commonly known as:  HYDRODIURIL  Take 12.5 mg by mouth daily.     insulin aspart 100 UNIT/ML injection  Commonly known as:  novoLOG  Inject 7 Units into the skin 3 (three) times daily before meals. Sliding scale     Insulin  Pen Needle 31G X 5 MM Misc  Commonly known as:  B-D UF III MINI PEN NEEDLES  1 each by Does not apply route 2 (two) times daily.     latanoprost 0.005 % ophthalmic solution  Commonly known as:  XALATAN  Place 0.005 % into both eyes as directed.     Liraglutide 18 MG/3ML Sopn  Inject 1.8 mg into the skin daily.     metFORMIN 500 MG tablet  Commonly known as:  GLUCOPHAGE  Take 1 tablet (500 mg total) by mouth 3 (three) times daily.     pioglitazone 30 MG tablet  Commonly known as:  ACTOS  Take 30 mg by mouth daily.     prednisoLONE acetate 1 % ophthalmic suspension  Commonly known as:  PRED FORTE     TEKTURNA HCT 150-12.5 MG Tabs  Generic drug:  Aliskiren-Hydrochlorothiazide  Daily.     VITAMIN D PO  Take by mouth daily. Taking 5000 daily        Allergies: No Known Allergies  Past Medical History  Diagnosis Date  . Hypertension   . Diabetes mellitus   . Heart murmur     SYSTOLIC MURMUR AT BASE  . Hyperlipidemia     Past Surgical History  Procedure Laterality Date  . Tonsillectomy      IN [facility]  . Hemorroidectomy  1969  . Hernia repair  AFE 18-17  . Hernia repair  1981    DR WEATHERLY    No family history on file.  Social History:  reports that he quit smoking about 33 years ago. His smoking use included Cigarettes. He has a 6 pack-year smoking history. He has never used smokeless tobacco. He reports that he does not drink alcohol or use illicit drugs.  Review of Systems:  HYPERTENSION:  well controlled now  HYPERLIPIDEMIA: The lipid abnormality consists of elevated  LDL, on Lipitor long-term.   no history of numbness or tingling in his feet     Examination:   BP 112/52  Pulse 65  Temp(Src) 98.5 F (36.9 C)  Resp 12  Ht 5\' 11"  (1.803 m)  Wt 226 lb 8 oz (102.74 kg)  BMI 31.6 kg/m2  SpO2 93%  Body mass index is 31.6 kg/(m^2).    no pedal edema  ASSESSMENT/ PLAN::   Diabetes type 2   The patient's diabetes control appears to be  better with  increasing his Victoza and metformin Although he did not bring his monitor for review his home readings are better and also his fructosamine is in the normal range His fasting readings are more consistently near normal with only occasional high readings after meals  He  is trying to exercise more consistently Will continue same regimen for now  [facility] 05/02/2013, 10:01 AM

## 2013-05-02 NOTE — Patient Instructions (Signed)
Please check blood sugars at least half the time about 2 hours after any meal and as directed on waking up.   Please bring blood sugar monitor to each visit  

## 2013-06-10 ENCOUNTER — Other Ambulatory Visit: Payer: Self-pay | Admitting: *Deleted

## 2013-06-10 MED ORDER — ATORVASTATIN CALCIUM 40 MG PO TABS: 40.0000 mg | ORAL_TABLET | Freq: Every day | ORAL | Status: DC

## 2013-07-15 ENCOUNTER — Other Ambulatory Visit: Payer: Self-pay | Admitting: *Deleted

## 2013-07-15 MED ORDER — GLIPIZIDE ER 5 MG PO TB24: 5.0000 mg | ORAL_TABLET | Freq: Every day | ORAL | Status: DC

## 2013-07-19 ENCOUNTER — Other Ambulatory Visit (INDEPENDENT_AMBULATORY_CARE_PROVIDER_SITE_OTHER): Payer: Medicare Other

## 2013-07-19 DIAGNOSIS — E1165 Type 2 diabetes mellitus with hyperglycemia: Secondary | ICD-10-CM

## 2013-07-19 DIAGNOSIS — IMO0001 Reserved for inherently not codable concepts without codable children: Secondary | ICD-10-CM

## 2013-07-19 DIAGNOSIS — I119 Hypertensive heart disease without heart failure: Secondary | ICD-10-CM

## 2013-07-19 LAB — BASIC METABOLIC PANEL
BUN: 29 mg/dL — AB (ref 6–23)
CHLORIDE: 101 meq/L (ref 96–112)
CO2: 26 mEq/L (ref 19–32)
Calcium: 9.2 mg/dL (ref 8.4–10.5)
Creatinine, Ser: 1.5 mg/dL (ref 0.4–1.5)
GFR: 58.57 mL/min — ABNORMAL LOW (ref >60.00)
GLUCOSE: 125 mg/dL — AB (ref 70–99)
POTASSIUM: 3.9 meq/L (ref 3.5–5.1)
SODIUM: 137 meq/L (ref 135–145)

## 2013-07-19 LAB — LIPID PANEL
Cholesterol: 110 mg/dL (ref 0–200)
HDL: 35.1 mg/dL — AB (ref >39.00)
LDL CALC: 57 mg/dL (ref 0–99)
Total CHOL/HDL Ratio: 3
Triglycerides: 89 mg/dL (ref 0.0–149.0)
VLDL: 17.8 mg/dL (ref 0.0–40.0)

## 2013-07-19 LAB — HEPATIC FUNCTION PANEL
ALT: 24 U/L (ref 0–53)
AST: 21 U/L (ref 0–37)
Albumin: 3.7 g/dL (ref 3.5–5.2)
Alkaline Phosphatase: 80 U/L (ref 39–117)
BILIRUBIN DIRECT: 0 mg/dL (ref 0.0–0.3)
BILIRUBIN TOTAL: 0.6 mg/dL (ref 0.3–1.2)
Total Protein: 7.2 g/dL (ref 6.0–8.3)

## 2013-07-19 LAB — HEMOGLOBIN A1C: Hgb A1c MFr Bld: 7.3 % — ABNORMAL HIGH (ref 4.6–6.5)

## 2013-07-19 NOTE — Progress Notes (Signed)
Quick Note:  Please make copy of labs for patient visit. ______ 

## 2013-07-24 ENCOUNTER — Encounter: Payer: Self-pay | Admitting: Cardiology

## 2013-07-24 ENCOUNTER — Ambulatory Visit: Payer: Medicare Other | Admitting: Cardiology

## 2013-07-24 ENCOUNTER — Ambulatory Visit (INDEPENDENT_AMBULATORY_CARE_PROVIDER_SITE_OTHER): Payer: Medicare Other | Admitting: Cardiology

## 2013-07-24 VITALS — BP 138/59 | HR 80 | Ht 71.0 in | Wt 226.0 lb

## 2013-07-24 DIAGNOSIS — R351 Nocturia: Secondary | ICD-10-CM

## 2013-07-24 DIAGNOSIS — IMO0001 Reserved for inherently not codable concepts without codable children: Secondary | ICD-10-CM

## 2013-07-24 DIAGNOSIS — I491 Atrial premature depolarization: Secondary | ICD-10-CM

## 2013-07-24 DIAGNOSIS — I119 Hypertensive heart disease without heart failure: Secondary | ICD-10-CM

## 2013-07-24 DIAGNOSIS — E1165 Type 2 diabetes mellitus with hyperglycemia: Secondary | ICD-10-CM

## 2013-07-24 DIAGNOSIS — E785 Hyperlipidemia, unspecified: Secondary | ICD-10-CM

## 2013-07-24 DIAGNOSIS — N401 Enlarged prostate with lower urinary tract symptoms: Secondary | ICD-10-CM

## 2013-07-24 NOTE — Assessment & Plan Note (Signed)
Blood pressure was remaining stable on current therapy.  No dizziness or syncope.  No headaches

## 2013-07-24 NOTE — Progress Notes (Signed)
Jeremy Jennings Date of Birth:  1935-12-10 [address] [place], [place]  [address] [phone]         Fax   [phone]  History of Present Illness: This pleasant 78 year old [place] American gentleman is seen for a scheduled followup office visit. He has a past history of diabetes, essential hypertension, and exogenous obesity. He does not have any history of known ischemic heart disease. He had a normal treadmill Cardiolite stress test in 2005. He has had a history of mild cardiomegaly and an echocardiogram in 2007 showed mild LVH with normal systolic function and with diastolic dysfunction. The patient gets some exercise. He enjoys gardening. .  Since last visit he is in not been aware of any new cardiac symptoms. He has not been aware of any palpitations. In April 2014 he had cataract surgery on his right eye. He has had a reasonable good result but is still having some speckles in front of his eye and is now also seeing a retinal specialist. There is been no evidence of any diabetic retinopathy.  Since last visit the patient has been having more urinary symptoms consistent with his BPH and is requesting to see a urologist.   Current Outpatient Prescriptions  Medication Sig Dispense Refill  . aspirin 500 MG EC tablet Take 500 mg by mouth every 6 (six) hours as needed.        . Cholecalciferol (VITAMIN D PO) Take by mouth daily. Taking 5000 daily      . COMBIGAN 0.2-0.5 % ophthalmic solution       . diltiazem (TIAZAC) 360 MG 24 hr capsule Take 360 mg by mouth daily.        Marland Kitchen FREESTYLE LITE test strip       . glipiZIDE (GLUCOTROL XL) 5 MG 24 hr tablet Take 1 tablet (5 mg total) by mouth daily.  30 tablet  5  . hydrochlorothiazide (HYDRODIURIL) 12.5 MG tablet Take 12.5 mg by mouth daily.        . insulin aspart (NOVOLOG) 100 UNIT/ML injection Inject 7 Units into the skin 3 (three) times daily before meals. Sliding scale      . Insulin Pen Needle (B-D UF III MINI PEN  NEEDLES) 31G X 5 MM MISC 1 each by Does not apply route 2 (two) times daily.  100 each  11  . latanoprost (XALATAN) 0.005 % ophthalmic solution Place 0.005 % into both eyes as directed.      . Liraglutide 18 MG/3ML SOPN Inject 1.8 mg into the skin daily.  3 pen  5  . metFORMIN (GLUCOPHAGE) 500 MG tablet 3 (three) times daily.      . pioglitazone (ACTOS) 30 MG tablet Take 30 mg by mouth daily.        . prednisoLONE acetate (PRED FORTE) 1 % ophthalmic suspension       . TEKTURNA HCT 150-12.5 MG TABS Daily.      Marland Kitchen atorvastatin (LIPITOR) 40 MG tablet Take 1 tablet (40 mg total) by mouth daily.  30 tablet  6   No current facility-administered medications for this visit.    No Known Allergies  Patient Active Problem List   Diagnosis Date Noted  . BPH associated with nocturia 07/24/2013  . PAC (premature atrial contraction) 07/24/2012  . Bursitis of left shoulder 07/15/2011  . Hyperlipidemia   . Hypertension   . Type II or unspecified type diabetes mellitus without mention of complication, uncontrolled     History  Smoking status  . Former Smoker -- 0.30 packs/day for 20 years  . Types: Cigarettes  . Quit date: 07/12/1979  Smokeless tobacco  . Never Used    History  Alcohol Use No    No family history on file.  Review of Systems: Constitutional: no fever chills diaphoresis or fatigue or change in weight.  Head and neck: no hearing loss, no epistaxis, no photophobia or visual disturbance. Respiratory: No cough, shortness of breath or wheezing. Cardiovascular: No chest pain peripheral edema, palpitations. Gastrointestinal: No abdominal distention, no abdominal pain, no change in bowel habits hematochezia or melena. Genitourinary: No dysuria, no frequency, no urgency, no nocturia. Musculoskeletal:No arthralgias, no back pain, no gait disturbance or myalgias. Neurological: No dizziness, no headaches, no numbness, no seizures, no syncope, no weakness, no tremors. Hematologic: No  lymphadenopathy, no easy bruising. Psychiatric: No confusion, no hallucinations, no sleep disturbance.    Physical Exam: Filed Vitals:   07/24/13 1026  BP: 138/59  Pulse: 80   the general appearance reveals a well-developed well-nourished elderly gentleman in no distress.The head and neck exam reveals pupils equal and reactive.  Extraocular movements are full.  There is no scleral icterus.  The mouth and pharynx are normal.  The neck is supple.  The carotids reveal no bruits.  The jugular venous pressure is normal.  The  thyroid is not enlarged.  There is no lymphadenopathy.  The chest is clear to percussion and auscultation.  There are no rales or rhonchi.  Expansion of the chest is symmetrical.  The precordium is quiet.  The first heart sound is normal.  The second heart sound is physiologically split.  There is no murmur gallop rub or click.  There is no abnormal lift or heave.  The abdomen is soft and nontender.  The bowel sounds are normal.  The liver and spleen are not enlarged.  There are no abdominal masses.  There are no abdominal bruits.  Extremities reveal good pedal pulses.  There is no phlebitis or edema.  There is no cyanosis or clubbing.  Strength is normal and symmetrical in all extremities.  There is no lateralizing weakness.  There are no sensory deficits.  The skin is warm and dry.  There is no rash.  EKG today shows normal sinus rhythm with frequent PACs.   Assessment / Plan: The patient is to continue same medication.  Recheck in 4 months for office visit EKG lipid panel, A1c, hepatic function panel and basal metabolic panel.  We have arranged for referral to urology for his BPH symptoms.

## 2013-07-24 NOTE — Assessment & Plan Note (Signed)
The patient has nocturia several times a night.  He would like to see a urologist concerning this.  We have made him an appointment with [name] for 08/02/13 at 1:15 PM.

## 2013-07-24 NOTE — Assessment & Plan Note (Signed)
The patient has not been experiencing any hypoglycemic episodes.  His recent A1c is 7.3 which is down from 8.3 last time

## 2013-07-24 NOTE — Assessment & Plan Note (Signed)
The patient has a past history of premature atrial contractions.  His pulse today seemed more rapid and irregular and I was concerned that he might be in atrial fibrillation.  However his EKG confirms that he is in normal sinus rhythm with frequent PACs.  There are no ischemic changes.  The patient himself notes occasional palpitations but no severe symptoms or awareness of his heartbeat.

## 2013-07-24 NOTE — Patient Instructions (Addendum)
Have scheduled appointment an appointment with Dr Junious Silk on 08/02/13 at 1:15 at [facility] [phone] next to [facility]   Your physician recommends that you continue on your current medications as directed. Please refer to the Current Medication list given to you today.  Your physician wants you to follow-up in: 4 months with fasting labs (lp/bmet/hfp/a1c)  You will receive a reminder letter in the mail two months in advance. If you don't receive a letter, please call our office to schedule the follow-up appointment.

## 2013-07-24 NOTE — Assessment & Plan Note (Signed)
The patient is on Lipitor 40 mg daily.  Is not having any myalgias or other side effects.  Lipids remain satisfactory.

## 2013-07-26 ENCOUNTER — Other Ambulatory Visit: Payer: Self-pay | Admitting: *Deleted

## 2013-07-26 MED ORDER — PIOGLITAZONE HCL 30 MG PO TABS: 30.0000 mg | ORAL_TABLET | Freq: Every day | ORAL | Status: DC

## 2013-08-08 ENCOUNTER — Encounter: Payer: Self-pay | Admitting: Endocrinology

## 2013-08-08 ENCOUNTER — Ambulatory Visit (INDEPENDENT_AMBULATORY_CARE_PROVIDER_SITE_OTHER): Payer: Medicare Other | Admitting: Endocrinology

## 2013-08-08 VITALS — BP 122/62 | HR 112 | Temp 97.6°F | Resp 16 | Ht 71.0 in | Wt 227.7 lb

## 2013-08-08 DIAGNOSIS — IMO0001 Reserved for inherently not codable concepts without codable children: Secondary | ICD-10-CM

## 2013-08-08 DIAGNOSIS — N182 Chronic kidney disease, stage 2 (mild): Secondary | ICD-10-CM

## 2013-08-08 DIAGNOSIS — I10 Essential (primary) hypertension: Secondary | ICD-10-CM

## 2013-08-08 DIAGNOSIS — E1165 Type 2 diabetes mellitus with hyperglycemia: Principal | ICD-10-CM

## 2013-08-08 NOTE — Patient Instructions (Signed)
Novolog 4-6 at supper Metformin 1 in am and 2 in late evening

## 2013-08-08 NOTE — Progress Notes (Signed)
Patient ID: Jeremy Jennings, male   DOB: 1936-06-09, 78 y.o.   MRN: [id number]   Reason for Appointment: Diabetes follow-up   History of Present Illness    Diagnosis: Type 2 DIABETES MELITUS, date of diagnosis:  1981        PREVIOUS history: He has had long-standing diabetes treated with multiple drugs over the years and still continues to take a multidrug regimen He was started on Victoza in 2010 somewhat better diabetes control and better weight However because of tendency to high postprandial readings and higher A1c was also given mealtime coverage with NovoLog about 2 years ago  RECENT history: Because of his A1c going up to 8.3 in 03/2013 his Victoza was increased from 1.2 up to 1.8 mg Also because of his stable creatinine of 1.4 his metformin was increased to 1500 mg. He was continued on Actos and glipizide also With this his blood sugars improved significantly A1c is now about 1% better More recently has started having low sugars around supper time, mostly right after eating He has reduced his NovoLog slightly at suppertime and also taking his third metformin at 9 PM instead of suppertime However does have some fluctuation in his blood sugars recently with readings as high as 214 in the afternoon; morning sugars are also somewhat variable but recently better  Oral hypoglycemic drugs: Actos, metformin, glipizide        Side effects from medications: None Insulin regimen: 9-9-6, 7-10 before meals of NovoLog       Proper timing of medications in relation to meals: Yes.         Monitors blood glucose: Once a day.    Glucometer:  FreeStyle         Blood Glucose readings from meter download:   PREMEAL Breakfast Lunch Dinner Bedtime Overall  Glucose range:  93-171   127-78   38-214   88   Mean/median:  140      130    POST-MEAL PC Breakfast PC Lunch PC Dinner  Glucose range:   107-168  49-129   Mean/median:      Has had low blood sugars on 5 days in the last 2 weeks between 6 PM-8:30  PM  .          Meals: 3 meals per day. 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 Latest known visit with results is:  Appointment on 07/19/2013  Component Date Value Range Status  . Cholesterol 07/19/2013 110  0 - 200 mg/dL Final   ATP III Classification       Desirable:  < 200 mg/dL               Borderline High:  200 - 239 mg/dL          High:  > = 240 mg/dL  . Triglycerides 07/19/2013 89.0  0.0 - 149.0 mg/dL Final   Normal:  <150 mg/dLBorderline High:  150 - 199 mg/dL  . HDL 07/19/2013 35.10* >39.00 mg/dL Final  . VLDL 07/19/2013 17.8  0.0 - 40.0 mg/dL Final  . LDL Cholesterol 07/19/2013 57  0 - 99 mg/dL Final  . Total CHOL/HDL Ratio 07/19/2013 3   Final                  Men          Women1/2 Average Risk     3.4          3.3Average Risk          5.0          4.42X Average Risk          9.6          7.13X Average Risk          15.0          11.0                      . Total Bilirubin 07/19/2013 0.6  0.3 - 1.2 mg/dL Final  . Bilirubin, Direct 07/19/2013 0.0  0.0 - 0.3 mg/dL Final  . Alkaline Phosphatase 07/19/2013 80  39 - 117 U/L Final  . AST 07/19/2013 21  0 - 37 U/L Final  . ALT 07/19/2013 24  0 - 53 U/L Final  . Total Protein 07/19/2013 7.2  6.0 - 8.3 g/dL Final  . Albumin 07/19/2013 3.7  3.5 - 5.2 g/dL Final  . Sodium 07/19/2013 137  135 - 145 mEq/L Final  . Potassium 07/19/2013 3.9  3.5 - 5.1 mEq/L  Final  . Chloride 07/19/2013 101  96 - 112 mEq/L Final  . CO2 07/19/2013 26  19 - 32 mEq/L Final  . Glucose, Bld 07/19/2013 125* 70 - 99 mg/dL Final  . BUN 07/19/2013 29* 6 - 23 mg/dL Final  . Creatinine, Ser 07/19/2013 1.5  0.4 - 1.5 mg/dL Final  . Calcium 07/19/2013 9.2  8.4 - 10.5 mg/dL Final  . GFR 07/19/2013 58.57* >60.00 mL/min Final  . Hemoglobin A1C 07/19/2013 7.3* 4.6 - 6.5 % Final   Glycemic Control Guidelines for People with Diabetes:Non Diabetic:  <6%Goal of Therapy: <7%Additional Action Suggested:  >8%       Medication List       This list is accurate as of: 08/08/13  1:16 PM.  Always use your most recent med list.               aspirin 500 MG EC tablet  Take 500 mg by mouth every 6 (six) hours as needed.     atorvastatin 40 MG tablet  Commonly known as:  LIPITOR  Take 1 tablet (40 mg total) by mouth daily.     COMBIGAN 0.2-0.5 % ophthalmic solution  Generic drug:  brimonidine-timolol     diltiazem 360 MG 24 hr capsule  Commonly known as:  TIAZAC  Take 360 mg by mouth daily.     FREESTYLE LITE test strip  Generic drug:  glucose blood     glipiZIDE 5 MG 24 hr tablet  Commonly known as:  GLUCOTROL XL  Take 1 tablet (5 mg total) by mouth daily.     hydrochlorothiazide 12.5 MG tablet  Commonly known as:  HYDRODIURIL  Take 12.5 mg by mouth daily.     insulin aspart 100 UNIT/ML injection  Commonly known as:  novoLOG  Inject 9-10 Units into the skin 3 (three) times daily before meals. Sliding scale     Insulin Pen Needle 31G X 5 MM Misc  Commonly known as:  B-D UF III MINI PEN NEEDLES  1 each by Does not apply route 2 (two) times daily.     latanoprost 0.005 % ophthalmic solution  Commonly known as:  XALATAN  Place 0.005 % into both eyes as directed.     levofloxacin 500 MG tablet  Commonly known as:  LEVAQUIN     Liraglutide 18 MG/3ML Sopn  Inject 1.8 mg into the skin daily.     metFORMIN 500 MG tablet  Commonly known as:  GLUCOPHAGE  3 (three)  times daily.     pioglitazone 30 MG tablet  Commonly known as:  ACTOS  Take 1 tablet (30 mg total) by mouth daily.     prednisoLONE acetate 1 % ophthalmic suspension  Commonly known as:  PRED FORTE     tamsulosin 0.4 MG Caps capsule  Commonly known as:  FLOMAX     TEKTURNA HCT 150-12.5 MG Tabs  Generic drug:  Aliskiren-Hydrochlorothiazide  Daily.     VITAMIN D PO  Take by mouth daily. Taking 5000 daily        Allergies: No Known Allergies  Past Medical History  Diagnosis Date  . Hypertension   . Diabetes mellitus   . Heart murmur     SYSTOLIC MURMUR AT BASE  . Hyperlipidemia     Past Surgical History  Procedure Laterality Date  . Tonsillectomy      IN [facility]  . Hemorroidectomy  1969  . Hernia repair  AFE 16-17  . Hernia repair  1981    DR WEATHERLY    No family history on file.  Social History:  reports that he quit smoking about 34 years ago. His smoking use included Cigarettes. He has a 6 pack-year smoking history. He has never used smokeless tobacco. He reports that he does not drink alcohol or use illicit drugs.  Review of Systems:  HYPERTENSION:  well controlled now  HYPERLIPIDEMIA: The lipid abnormality consists of elevated LDL, on Lipitor for several years   Lab Results  Component Value Date   CHOL 110 07/19/2013   HDL 35.10* 07/19/2013   LDLCALC 57 07/19/2013   TRIG 89.0 07/19/2013   CHOLHDL 3 07/19/2013      Chronic renal insufficiency: His creatinine is relatively stable at 1.5     Examination:   BP 122/62  Pulse 112  Temp(Src) 97.6 F (36.4 C)  Resp 16  Ht 5\' 11"  (1.803 m)  Wt 227 lb 11.2 oz (103.284 kg)  BMI 31.77 kg/m2  SpO2 95%  Body mass index is 31.77 kg/(m^2).    no pedal edema  ASSESSMENT/ PLAN::   Diabetes type 2   The patient's diabetes  control is overall   better with increasing his Victoza and metformin A1c is also improved Although his blood sugars are not consistent he seems to be having lower readings after dinner for no  apparent reason For now will reduce his suppertime dose of NovoLog to 4-6 units based on meal size Also he can take  Metformin one in the morning and 2 late in the evening To call if he has continued low sugars or persistently high morning sugars  Encouraged him to continue walking [place] the  mall  Hypertension: Well controlled   Zamaria Brazzle 08/08/2013, 1:16 PM

## 2013-08-12 ENCOUNTER — Other Ambulatory Visit: Payer: Self-pay | Admitting: *Deleted

## 2013-08-12 MED ORDER — ALISKIREN-HYDROCHLOROTHIAZIDE 150-12.5 MG PO TABS: ORAL_TABLET | ORAL | Status: DC

## 2013-08-28 ENCOUNTER — Telehealth: Payer: Self-pay | Admitting: Cardiology

## 2013-08-28 NOTE — Telephone Encounter (Signed)
New message  Patient's daughter would like for you to give her a call regarding a referral for her father to a Urologist. Please call and advice.

## 2013-08-28 NOTE — Telephone Encounter (Signed)
[facility] Urologist recommendations from  Dr. Mare Ferrari. Will discuss at patients appointment tomorrow

## 2013-08-29 ENCOUNTER — Ambulatory Visit (INDEPENDENT_AMBULATORY_CARE_PROVIDER_SITE_OTHER): Payer: Medicare Other | Admitting: Cardiology

## 2013-08-29 ENCOUNTER — Encounter: Payer: Self-pay | Admitting: Cardiology

## 2013-08-29 VITALS — BP 126/58 | HR 98 | Ht 71.0 in | Wt 219.0 lb

## 2013-08-29 DIAGNOSIS — R197 Diarrhea, unspecified: Secondary | ICD-10-CM

## 2013-08-29 DIAGNOSIS — I119 Hypertensive heart disease without heart failure: Secondary | ICD-10-CM

## 2013-08-29 DIAGNOSIS — E785 Hyperlipidemia, unspecified: Secondary | ICD-10-CM

## 2013-08-29 DIAGNOSIS — R972 Elevated prostate specific antigen [PSA]: Secondary | ICD-10-CM

## 2013-08-29 DIAGNOSIS — I491 Atrial premature depolarization: Secondary | ICD-10-CM

## 2013-08-29 NOTE — Progress Notes (Signed)
Jeremy Jennings Date of Birth:  February 09, 1936 [address] [place], [place]  [address] [phone]         Fax   [phone]  History of Present Illness: This pleasant 78 year old African American gentleman is seen for work in office visit.  He has several new problems.  He had severe gastroenteritis over the weekend with vomiting and diarrhea.  The symptoms are now gradually improving.  He did take some Pepto-Bismol on a when necessary basis.  His other problem is a recently discovered elevated PSA level.  At his last visit we had sent him to a urologist [name] because of symptoms of BPH and nocturia.  Patient states that his PSA came back at 62 and subsequently repeated at 30.  He has a past history of diabetes, essential hypertension, and exogenous obesity. He does not have any history of known ischemic heart disease. He had a normal treadmill Cardiolite stress test in 2005. He has had a history of mild cardiomegaly and an echocardiogram in 2007 showed mild LVH with normal systolic function and with diastolic dysfunction. The patient gets some exercise. He enjoys gardening. . Also he has had an ongoing problem with bursitis of his left shoulder and has been seeing a chiropractor. His shoulder problem has improved since last visit Since last visit he is in not been aware of any new cardiac symptoms. He has not been aware of any palpitations. In April 2014 he had cataract surgery on his right eye. He has had a reasonable good result but is still having some speckles in front of his eye and is now also seeing a retinal specialist. There is been no evidence of any diabetic retinopathy.   Current Outpatient Prescriptions  Medication Sig Dispense Refill  . Aliskiren-Hydrochlorothiazide (TEKTURNA HCT) 150-12.5 MG TABS Take 1 tablet daily by mouth  30 each  5  . aspirin 500 MG EC tablet Take 500 mg by mouth every 6 (six) hours as needed.        Marland Kitchen atorvastatin (LIPITOR) 40 MG tablet  Take 1 tablet (40 mg total) by mouth daily.  30 tablet  6  . Cholecalciferol (VITAMIN D PO) Take by mouth daily. Taking 5000 daily      . COMBIGAN 0.2-0.5 % ophthalmic solution       . diltiazem (TIAZAC) 360 MG 24 hr capsule Take 360 mg by mouth daily.        Marland Kitchen FREESTYLE LITE test strip       . glipiZIDE (GLUCOTROL XL) 5 MG 24 hr tablet Take 1 tablet (5 mg total) by mouth daily.  30 tablet  5  . hydrochlorothiazide (HYDRODIURIL) 12.5 MG tablet Take 12.5 mg by mouth daily.        . insulin aspart (NOVOLOG) 100 UNIT/ML injection Inject 9-10 Units into the skin 3 (three) times daily before meals. Sliding scale      . Insulin Pen Needle (B-D UF III MINI PEN NEEDLES) 31G X 5 MM MISC 1 each by Does not apply route 2 (two) times daily.  100 each  11  . latanoprost (XALATAN) 0.005 % ophthalmic solution Place 0.005 % into both eyes as directed.      Marland Kitchen levofloxacin (LEVAQUIN) 500 MG tablet       . Liraglutide 18 MG/3ML SOPN Inject 1.8 mg into the skin daily.  3 pen  5  . metFORMIN (GLUCOPHAGE) 500 MG tablet 3 (three) times daily.      . pioglitazone (  ACTOS) 30 MG tablet Take 1 tablet (30 mg total) by mouth daily.  30 tablet  5  . prednisoLONE acetate (PRED FORTE) 1 % ophthalmic suspension       . tamsulosin (FLOMAX) 0.4 MG CAPS capsule        No current facility-administered medications for this visit.    No Known Allergies  Patient Active Problem List   Diagnosis Date Noted  . Chronic kidney disease, stage II (mild) 08/08/2013  . BPH associated with nocturia 07/24/2013  . Benign hypertensive heart disease without heart failure 07/24/2013  . PAC (premature atrial contraction) 07/24/2012  . Bursitis of left shoulder 07/15/2011  . Hyperlipidemia   . Hypertension   . Type II or unspecified type diabetes mellitus without mention of complication, uncontrolled     History  Smoking status  . Former Smoker -- 0.30 packs/day for 20 years  . Types: Cigarettes  . Quit date: 07/12/1979  Smokeless  tobacco  . Never Used    History  Alcohol Use No    No family history on file.  Review of Systems: Constitutional: no fever chills diaphoresis or fatigue or change in weight.  Head and neck: no hearing loss, no epistaxis, no photophobia or visual disturbance. Respiratory: No cough, shortness of breath or wheezing. Cardiovascular: No chest pain peripheral edema, palpitations. Gastrointestinal: No abdominal distention, no abdominal pain, no change in bowel habits hematochezia or melena. Genitourinary: No dysuria, no frequency, no urgency, no nocturia. Musculoskeletal:No arthralgias, no back pain, no gait disturbance or myalgias. Neurological: No dizziness, no headaches, no numbness, no seizures, no syncope, no weakness, no tremors. Hematologic: No lymphadenopathy, no easy bruising. Psychiatric: No confusion, no hallucinations, no sleep disturbance.    Physical Exam: Filed Vitals:   08/29/13 1054  BP: 126/58  Pulse: 98   the general appearance reveals a well-developed well-nourished elderly gentleman in no distress.The head and neck exam reveals pupils equal and reactive.  Extraocular movements are full.  There is no scleral icterus.  The mouth and pharynx are normal.  The neck is supple.  The carotids reveal no bruits.  The jugular venous pressure is normal.  The  thyroid is not enlarged.  There is no lymphadenopathy.  The chest is clear to percussion and auscultation.  There are no rales or rhonchi.  Expansion of the chest is symmetrical.  The precordium is quiet.  The first heart sound is normal.  The second heart sound is physiologically split.  There is no murmur gallop rub or click.  There is no abnormal lift or heave.  The abdomen is soft and nontender.  The bowel sounds are normal.  The liver and spleen are not enlarged.  There are no abdominal masses.  There are no abdominal bruits.  Extremities reveal good pedal pulses.  There is no phlebitis or edema.  There is no cyanosis or  clubbing.  Strength is normal and symmetrical in all extremities.  There is no lateralizing weakness.  There are no sensory deficits.  The skin is warm and dry.  There is no rash.     Assessment / Plan: The patient is to continue same medication.  Recheck in 4 months for office visit EKG lipid panel hepatic function panel and basal metabolic panel.

## 2013-08-29 NOTE — Assessment & Plan Note (Signed)
Blood pressure is remaining stable on current therapy.  No dizziness or syncope. 

## 2013-08-29 NOTE — Assessment & Plan Note (Signed)
The patient had been scheduled for a prostate biopsy to evaluate his elevated PSA.  The biopsy was to have been done today.  However the procedure has been postponed because of the patient's recent problems with gastroenteritis and diarrhea.

## 2013-08-29 NOTE — Assessment & Plan Note (Signed)
At his last office visit in January 2015 he was noted to have an irregular pulse.  EKG showed frequent PACs.  The patient has not been aware of any symptoms referable to his heart.  EKG today again shows frequent PACs but no atrial fibrillation.

## 2013-08-29 NOTE — Patient Instructions (Signed)
Your physician recommends that you continue on your current medications as directed. Please refer to the Current Medication list given to you today  Follow up in June as scheduled

## 2013-10-10 ENCOUNTER — Other Ambulatory Visit (HOSPITAL_COMMUNITY): Payer: Self-pay | Admitting: Urology

## 2013-10-10 DIAGNOSIS — C61 Malignant neoplasm of prostate: Secondary | ICD-10-CM

## 2013-10-22 ENCOUNTER — Encounter (HOSPITAL_COMMUNITY)
Admission: RE | Admit: 2013-10-22 | Discharge: 2013-10-22 | Disposition: A | Discharge status: Home / Self Care | Payer: Medicare Other | Source: Ambulatory Visit | Attending: Urology | Admitting: Urology

## 2013-10-22 ENCOUNTER — Encounter (HOSPITAL_COMMUNITY): Payer: Self-pay

## 2013-10-22 DIAGNOSIS — C61 Malignant neoplasm of prostate: Secondary | ICD-10-CM

## 2013-10-22 HISTORY — DX: Malignant neoplasm of prostate: C61

## 2013-10-22 MED ORDER — TECHNETIUM TC 99M MEDRONATE IV KIT
27.5000 | PACK | Freq: Once | INTRAVENOUS | Status: AC | PRN
  Administered 2013-10-22: 27.5 via INTRAVENOUS

## 2013-11-04 ENCOUNTER — Other Ambulatory Visit: Payer: Self-pay | Admitting: *Deleted

## 2013-11-04 MED ORDER — METFORMIN HCL 500 MG PO TABS: 500.0000 mg | ORAL_TABLET | Freq: Three times a day (TID) | ORAL | Status: DC

## 2013-11-11 ENCOUNTER — Telehealth: Payer: Self-pay | Admitting: Oncology

## 2013-11-11 NOTE — Telephone Encounter (Signed)
LEFT MESSAGE FOR PATIENT TO RETURN CALL TO SCHEDULE NP APPT.  °

## 2013-11-14 ENCOUNTER — Telehealth: Payer: Self-pay | Admitting: Oncology

## 2013-11-14 NOTE — Telephone Encounter (Signed)
C/D 11/14/13 for appt. 11/20/13

## 2013-11-14 NOTE — Telephone Encounter (Signed)
S/W PATIENT AND GAVE NP APPT FOR 05/13 @ 1:30 W/DR. SHADAD.  [place] DX- PROSTATE CA  WELCOME PACKET MAILED.

## 2013-11-15 ENCOUNTER — Other Ambulatory Visit: Payer: Self-pay | Admitting: Oncology

## 2013-11-15 DIAGNOSIS — C61 Malignant neoplasm of prostate: Secondary | ICD-10-CM

## 2013-11-20 ENCOUNTER — Encounter: Payer: Self-pay | Admitting: Oncology

## 2013-11-20 ENCOUNTER — Ambulatory Visit (HOSPITAL_BASED_OUTPATIENT_CLINIC_OR_DEPARTMENT_OTHER): Payer: Medicare Other

## 2013-11-20 ENCOUNTER — Ambulatory Visit (HOSPITAL_BASED_OUTPATIENT_CLINIC_OR_DEPARTMENT_OTHER): Payer: Medicare Other | Admitting: Oncology

## 2013-11-20 VITALS — BP 140/62 | HR 96 | Temp 97.2°F | Resp 19 | Ht 71.0 in | Wt 224.3 lb

## 2013-11-20 DIAGNOSIS — C7951 Secondary malignant neoplasm of bone: Secondary | ICD-10-CM

## 2013-11-20 DIAGNOSIS — C61 Malignant neoplasm of prostate: Secondary | ICD-10-CM | POA: Insufficient documentation

## 2013-11-20 DIAGNOSIS — C7952 Secondary malignant neoplasm of bone marrow: Secondary | ICD-10-CM

## 2013-11-20 NOTE — Progress Notes (Signed)
Checked in new patient with no financial issues at this time. He has not been out of the country,.

## 2013-11-20 NOTE — Consult Note (Signed)
Reason for Referral: Prostate cancer.   HPI: 78 year old African American gentleman currently of [place] where he lived the majority of his life. He is a retired Scientist, physiological from the [employer] He is a gentleman with past medical history significant for hypertension and diabetes and found to have an elevated PSA up to 30 on routine screening. He was referred to [name] and a prostate biopsy done on 10/09/2013 showed a prostate cancer predominant Gleason score 5+4 = 9 and the majority of the cores with 1 core of 5+5 = 10. He subsequently underwent a staging workup including a bone scan done on 10/22/2013. This showed an abnormal uptake in the posterior left periacetabular region suspicious for metastatic disease. Is also increased uptake in the posterior left seventh rib. Increased uptake in the L4 vertebral body is modest and may represents arthritis. CT scan of the abdomen and pelvis obtained on 10/22/2013 showed multiple enlarged retroperitoneal and pelvic lymph nodes. A 1.4 cm left external iliac lymph node and a 2.4 cm left external iliac lymph node. There is a 1.8 cm left pelvic sidewall nodes as well. He was started on androgen depravation by [name] in the form of Trelstar [place] and planning to get Delton See today  today as well. He was referred to me for the evaluation for possible systemic chemotherapy. Clinically, he is asymptomatic. He does not report any constitutional symptoms of weight loss or appetite changes. He does not report any back pain shoulder pain or rib pain. He does report having sure you after the biopsy but this has resolved. He did not report any dysuria or flank pain. He did not report any deterioration in his performance status or activity level. He continues to live independently without any decline.   Past Medical History  Diagnosis Date  . Hypertension   . Diabetes mellitus   . Heart murmur     SYSTOLIC MURMUR AT BASE  . Hyperlipidemia    . Prostate cancer   :  Past Surgical History  Procedure Laterality Date  . Tonsillectomy      IN [facility]  . Hemorroidectomy  1969  . Hernia repair  AFE [age]  . Hernia repair  1981    DR WEATHERLY  :  Current Outpatient Prescriptions  Medication Sig Dispense Refill  . Aliskiren-Hydrochlorothiazide (TEKTURNA HCT) 150-12.5 MG TABS Take 1 tablet daily by mouth  30 each  5  . aspirin 500 MG EC tablet Take 500 mg by mouth every 6 (six) hours as needed.        Marland Kitchen atorvastatin (LIPITOR) 40 MG tablet Take 1 tablet (40 mg total) by mouth daily.  30 tablet  6  . Cholecalciferol (VITAMIN D PO) Take by mouth daily. Taking 5000 daily      . COMBIGAN 0.2-0.5 % ophthalmic solution       . diltiazem (TIAZAC) 360 MG 24 hr capsule Take 360 mg by mouth daily.        Marland Kitchen FREESTYLE LITE test strip       . glipiZIDE (GLUCOTROL XL) 5 MG 24 hr tablet Take 1 tablet (5 mg total) by mouth daily.  30 tablet  5  . Glucose Blood (FREESTYLE LITE TEST VI)       . hydrochlorothiazide (HYDRODIURIL) 12.5 MG tablet Take 12.5 mg by mouth daily.        . insulin aspart (NOVOLOG) 100 UNIT/ML injection Inject 9-10 Units into the skin 3 (three) times daily before meals. Sliding scale      .  Insulin Pen Needle (B-D UF III MINI PEN NEEDLES) 31G X 5 MM MISC 1 each by Does not apply route 2 (two) times daily.  100 each  11  . latanoprost (XALATAN) 0.005 % ophthalmic solution Place 0.005 % into both eyes as directed.      Marland Kitchen levofloxacin (LEVAQUIN) 500 MG tablet       . Liraglutide 18 MG/3ML SOPN Inject 1.8 mg into the skin daily.  3 pen  5  . metFORMIN (GLUCOPHAGE) 500 MG tablet Take 1 tablet (500 mg total) by mouth 3 (three) times daily.  90 tablet  0  . pioglitazone (ACTOS) 30 MG tablet Take 1 tablet (30 mg total) by mouth daily.  30 tablet  5  . prednisoLONE acetate (PRED FORTE) 1 % ophthalmic suspension       . tamsulosin (FLOMAX) 0.4 MG CAPS capsule        No current facility-administered medications for this visit.        No Known Allergies:  Family History: No prostate cancer or any other malignancy. Renal failure and family members noted.   History   Social History  . Marital Status: Widowed    Spouse Name: N/A    Number of Children: N/A  . Years of Education: N/A   Occupational History  . Not on file.   Social History Main Topics  . Smoking status: Former Smoker -- 0.30 packs/day for 20 years    Types: Cigarettes    Quit date: 07/12/1979  . Smokeless tobacco: Never Used  . Alcohol Use: No  . Drug Use: No  . Sexual Activity: Not on file   Other Topics Concern  . Not on file   Social History Narrative  . No narrative on file  :  Constitutional: negative for chills, fatigue, fevers and malaise Respiratory: negative for dyspnea on exertion, hemoptysis and pleurisy/chest pain Cardiovascular: negative for irregular heart beat, palpitations and paroxysmal nocturnal dyspnea Gastrointestinal: negative for abdominal pain, melena and vomiting Genitourinary:negative for dysuria, frequency and urinary incontinence Integument/breast: negative for rash and skin color change Hematologic/lymphatic: negative for bleeding, easy bruising and lymphadenopathy Musculoskeletal:negative for arthralgias, back pain and myalgias Neurological: negative for coordination problems, dizziness and gait problems Behavioral/Psych: negative for anxiety and depression Endocrine: negative for temperature intolerance Allergic/Immunologic: negative for urticaria  Exam: ECOG 0 Blood pressure 140/62, pulse 96, temperature 97.2 F (36.2 C), temperature source Oral, resp. rate 19, height 5\' 11"  (1.803 m), weight 224 lb 4.8 oz (101.742 kg), SpO2 98.00%. General appearance: alert, cooperative and appears stated age Head: Normocephalic, without obvious abnormality, atraumatic Throat: lips, mucosa, and tongue normal; teeth and gums normal Neck: no adenopathy, no carotid bruit, no JVD and supple, symmetrical, trachea  midline Back: symmetric, no curvature. ROM normal. No CVA tenderness. Resp: clear to auscultation bilaterally Chest wall: no tenderness Cardio: regular rate and rhythm, S1, S2 normal, no murmur, click, rub or gallop GI: soft, non-tender; bowel sounds normal; no masses,  no organomegaly Extremities: extremities normal, atraumatic, no cyanosis or edema Pulses: 2+ and symmetric Skin: Skin color, texture, turgor normal. No rashes or lesions Lymph nodes: Cervical, supraclavicular, and axillary nodes normal. Neurologic: Grossly normal  Labs from 10/22/2013 were discussed which showed a creatinine of 1.5 with a GFR 45 cc per minute. His PSA was at 30.57   Nm Bone Scan Whole Body  10/22/2013   CLINICAL DATA:  Prostate carcinoma with rising PSA  EXAM: NUCLEAR MEDICINE WHOLE BODY BONE SCAN  Views: Whole-body  Radionuclide: Technetium  39m methylene diphosphonate  Dose:  27.5 mCi  Route of administration: Intravenous  COMPARISON:  CT pelvis October 22, 2013  FINDINGS: There is rather intense increased radiotracer uptake in the posterior left periacetabular region. There is increased radiotracer uptake in the posterior right eleventh rib. There is modest increased uptake in the L4 vertebral body.  A focal area of increased uptake in the left sternoclavicular joint is noted, a finding that may well be of arthropathic etiology. There is also slight increased uptake in both knees in a symmetric manner, almost certainly of arthropathic etiology.  Kidneys are noted in the flank positions bilaterally.  IMPRESSION: Intense abnormal uptake in the posterior left periacetabular region. CT obtained earlier in the day does show sclerosis in this area suspicious for metastatic disease of a blastic nature in this region.  Increased uptake in the posterior left eleventh rib. This finding could be due to previous trauma. Dedicated rib images are advised to assess for possible rib metastasis in this region.  Increased uptake in the  L4 vertebral body is modest and may well be of arthropathic change. Increased uptake in the left sternoclavicular joint may also be of arthropathic etiology. Increased uptake in both knees is felt to be of arthropathic etiology as well.   Electronically Signed   By: Lowella Grip M.D.   On: 10/22/2013 13:38    Assessment and Plan:    78 year old gentleman with the following issues:  1. Advanced prostate cancer presented with an elevated PSA of 30 and a biopsy confirmed the presence of high volume disease and his prostate involving 12 out of 12 cores. His Gleason score is predominantly 5+4 = 9 with one core of Gleason score 10. His staging workup revealed a few suspicious area of bony metastasis and retroperitoneal adenopathy. He was started on androgen depravation and plan to start his first [place] today. The natural course of advanced prostate cancer and treatment options were discussed today and I do agree with that at [facility] that this is an incurable situation. Treatment options are intended to palliate his cancer and extent overall survival. I explained to him that androgen deprivation is the standard of care and the question of adding chemotherapy is based on the CHAARTED  Trial which showed a benefit of adding systemic a chemotherapy of Taxotere for patients that have high volume disease. His Gleason score indicate a rather aggressive cancer and likely a short term androgen sensitivity but certainly does not have bulky disease that would fit this particular criteria. His bony metastasis is very minimal and has no visceral metastasis at this time. I discussed the approach of adding Taxotere chemotherapy now versus later when he develops castration resistant disease which I anticipate will be rather quick.   We agreed today to defer chemotherapy when he develops castration resistant given the fact that he did not fit the CHAARTED criteria but likely will need its in the near future.  2. Androgen  depravation: I agree with continuing androgen deprivation for the time being and likely indefinitely.  3. Bone directed therapy: I agree with calcium supplements and Xgeva. by [name].  All his questions were answered today, and I have given him my contact information for a followup when he develops castration resistant disease.

## 2013-11-20 NOTE — Progress Notes (Signed)
Please see consult note.  

## 2013-12-09 ENCOUNTER — Other Ambulatory Visit: Payer: Self-pay | Admitting: *Deleted

## 2013-12-09 MED ORDER — METFORMIN HCL 500 MG PO TABS: 500.0000 mg | ORAL_TABLET | Freq: Three times a day (TID) | ORAL | Status: DC

## 2013-12-13 ENCOUNTER — Encounter: Payer: Self-pay | Admitting: Cardiology

## 2013-12-13 ENCOUNTER — Ambulatory Visit (INDEPENDENT_AMBULATORY_CARE_PROVIDER_SITE_OTHER): Payer: Medicare Other | Admitting: Cardiology

## 2013-12-13 VITALS — BP 106/64 | HR 108 | Ht 71.0 in | Wt 224.8 lb

## 2013-12-13 DIAGNOSIS — R972 Elevated prostate specific antigen [PSA]: Secondary | ICD-10-CM

## 2013-12-13 DIAGNOSIS — E1165 Type 2 diabetes mellitus with hyperglycemia: Secondary | ICD-10-CM

## 2013-12-13 DIAGNOSIS — I491 Atrial premature depolarization: Secondary | ICD-10-CM

## 2013-12-13 DIAGNOSIS — IMO0001 Reserved for inherently not codable concepts without codable children: Secondary | ICD-10-CM

## 2013-12-13 DIAGNOSIS — E785 Hyperlipidemia, unspecified: Secondary | ICD-10-CM

## 2013-12-13 DIAGNOSIS — I119 Hypertensive heart disease without heart failure: Secondary | ICD-10-CM

## 2013-12-13 LAB — BASIC METABOLIC PANEL
BUN: 25 mg/dL — AB (ref 6–23)
CALCIUM: 9.1 mg/dL (ref 8.4–10.5)
CHLORIDE: 105 meq/L (ref 96–112)
CO2: 28 meq/L (ref 19–32)
CREATININE: 1.2 mg/dL (ref 0.4–1.5)
GFR: 74.96 mL/min (ref >60.00)
GLUCOSE: 211 mg/dL — AB (ref 70–99)
Potassium: 4.6 mEq/L (ref 3.5–5.1)
Sodium: 138 mEq/L (ref 135–145)

## 2013-12-13 LAB — HEPATIC FUNCTION PANEL
ALBUMIN: 3.3 g/dL — AB (ref 3.5–5.2)
ALK PHOS: 132 U/L — AB (ref 39–117)
ALT: 160 U/L — ABNORMAL HIGH (ref 0–53)
AST: 77 U/L — AB (ref 0–37)
BILIRUBIN DIRECT: 0.1 mg/dL (ref 0.0–0.3)
Total Bilirubin: 0.4 mg/dL (ref 0.2–1.2)
Total Protein: 6.2 g/dL (ref 6.0–8.3)

## 2013-12-13 LAB — LIPID PANEL
Cholesterol: 126 mg/dL (ref 0–200)
HDL: 51.3 mg/dL (ref >39.00)
LDL Cholesterol: 62 mg/dL (ref 0–99)
NONHDL: 74.7
Total CHOL/HDL Ratio: 2
Triglycerides: 64 mg/dL (ref 0.0–149.0)
VLDL: 12.8 mg/dL (ref 0.0–40.0)

## 2013-12-13 NOTE — Assessment & Plan Note (Signed)
The patient has not been having any significant hypoglycemic episodes.

## 2013-12-13 NOTE — Patient Instructions (Signed)
Will obtain labs today and call you with the results (LP/BMET/HFP)  Your physician recommends that you continue on your current medications as directed. Please refer to the Current Medication list given to you today.  Your physician wants you to follow-up in: 4 months with fasting labs (lp/bmet/hfp) You will receive a reminder letter in the mail two months in advance. If you don't receive a letter, please call our office to schedule the follow-up appointment.

## 2013-12-13 NOTE — Assessment & Plan Note (Signed)
Patient has a history of dyslipidemia and is on Lipitor.  He is not having any myalgias or side effects.

## 2013-12-13 NOTE — Assessment & Plan Note (Signed)
The patient is on 2 medications for his elevated PSA.  He takes an anti-testosterone shot every 3 months and he takes a different shot XGeva every month for his bony involvement

## 2013-12-13 NOTE — Assessment & Plan Note (Signed)
His blood pressure has been remaining stable on current therapy. 

## 2013-12-13 NOTE — Assessment & Plan Note (Signed)
The patient has chronic premature atrial contractions

## 2013-12-13 NOTE — Progress Notes (Signed)
Jeremy Jennings Date of Birth:  Oct 08, 1935 [employer] [address] [place], [place]  [address] [phone]        Fax   [phone]   History of Present Illness: This pleasant 78 year old [place] American gentleman is seen for a scheduled followup office visit.  He has a past history of hypertension and diabetes. He has several new problems. . His other problem is a recently discovered elevated PSA level. At his last visit we had sent him to a urologist [name] because of symptoms of BPH and nocturia. Patient states that his PSA came back at 74 and subsequently repeated at 30. He has a past history of diabetes, essential hypertension, and exogenous obesity. He does not have any history of known ischemic heart disease. He had a normal treadmill Cardiolite stress test in 2005. He has had a history of mild cardiomegaly and an echocardiogram in 2007 showed mild LVH with normal systolic function and with diastolic dysfunction. The patient gets some exercise. He enjoys gardening. . Also he has had an ongoing problem with bursitis of his left shoulder and has been seeing a chiropractor. His shoulder problem has improved since last visit Since last visit he is in not been aware of any new cardiac symptoms. He has not been aware of any palpitations. In April 2014 he had cataract surgery on his right eye. He has had a reasonable good result but is still having some speckles in front of his eye and is now also seeing a retinal specialist. There is been no evidence of any diabetic retinopathy.   Current Outpatient Prescriptions  Medication Sig Dispense Refill  . Aliskiren-Hydrochlorothiazide (TEKTURNA HCT) 150-12.5 MG TABS Take 1 tablet daily by mouth  30 each  5  . aspirin 500 MG EC tablet Take 500 mg by mouth every 6 (six) hours as needed.        Marland Kitchen atorvastatin (LIPITOR) 40 MG tablet Take 1 tablet (40 mg total) by mouth daily.  30 tablet  6  . Cholecalciferol (VITAMIN D PO)  Take by mouth daily. Taking 5000 daily      . COMBIGAN 0.2-0.5 % ophthalmic solution       . diltiazem (TIAZAC) 360 MG 24 hr capsule Take 360 mg by mouth daily.        Marland Kitchen FREESTYLE LITE test strip       . glipiZIDE (GLUCOTROL XL) 5 MG 24 hr tablet Take 1 tablet (5 mg total) by mouth daily.  30 tablet  5  . Glucose Blood (FREESTYLE LITE TEST VI)       . hydrochlorothiazide (HYDRODIURIL) 12.5 MG tablet Take 12.5 mg by mouth daily.        . insulin aspart (NOVOLOG) 100 UNIT/ML injection Inject 9-10 Units into the skin 3 (three) times daily before meals. Sliding scale      . Insulin Pen Needle (B-D UF III MINI PEN NEEDLES) 31G X 5 MM MISC 1 each by Does not apply route 2 (two) times daily.  100 each  11  . latanoprost (XALATAN) 0.005 % ophthalmic solution Place 0.005 % into the left eye as directed.       Marland Kitchen levofloxacin (LEVAQUIN) 500 MG tablet       . Liraglutide 18 MG/3ML SOPN Inject 1.8 mg into the skin daily.  3 pen  5  . metFORMIN (GLUCOPHAGE) 500 MG tablet Take 1 tablet (500 mg total) by mouth 3 (three) times daily.  90 tablet  0  . pioglitazone (ACTOS) 30 MG tablet Take 1 tablet (30 mg total) by mouth daily.  30 tablet  5  . prednisoLONE acetate (PRED FORTE) 1 % ophthalmic suspension Place into both eyes 2 (two) times daily.       . tamsulosin (FLOMAX) 0.4 MG CAPS capsule        No current facility-administered medications for this visit.    No Known Allergies  Patient Active Problem List   Diagnosis Date Noted  . Prostate cancer 11/20/2013  . Elevated PSA 08/29/2013  . Chronic kidney disease, stage II (mild) 08/08/2013  . BPH associated with nocturia 07/24/2013  . Benign hypertensive heart disease without heart failure 07/24/2013  . PAC (premature atrial contraction) 07/24/2012  . Bursitis of left shoulder 07/15/2011  . Hyperlipidemia   . Hypertension   . Type II or unspecified type diabetes mellitus without mention of complication, uncontrolled     History  Smoking status  .  Former Smoker -- 0.30 packs/day for 20 years  . Types: Cigarettes  . Quit date: 07/12/1979  Smokeless tobacco  . Never Used    History  Alcohol Use No    No family history on file.  Review of Systems: Constitutional: no fever chills diaphoresis or fatigue or change in weight.  Head and neck: no hearing loss, no epistaxis, no photophobia or visual disturbance. Respiratory: No cough, shortness of breath or wheezing. Cardiovascular: No chest pain peripheral edema, palpitations. Gastrointestinal: No abdominal distention, no abdominal pain, no change in bowel habits hematochezia or melena. Genitourinary: No dysuria, no frequency, no urgency, no nocturia. Musculoskeletal:No arthralgias, no back pain, no gait disturbance or myalgias. Neurological: No dizziness, no headaches, no numbness, no seizures, no syncope, no weakness, no tremors. Hematologic: No lymphadenopathy, no easy bruising. Psychiatric: No confusion, no hallucinations, no sleep disturbance.    Physical Exam: Filed Vitals:   12/13/13 0916  BP: 106/64  Pulse: 108   The general appearance reveals a well-developed well-nourished African American male in no distress.The head and neck exam reveals pupils equal and reactive.  Extraocular movements are full.  There is no scleral icterus.  The mouth and pharynx are normal.  The neck is supple.  The carotids reveal no bruits.  The jugular venous pressure is normal.  The  thyroid is not enlarged.  There is no lymphadenopathy.  The chest is clear to percussion and auscultation.  There are no rales or rhonchi.  Expansion of the chest is symmetrical.  The precordium is quiet.  The first heart sound is normal.  The second heart sound is physiologically split.  There is no murmur gallop rub or click.  There is no abnormal lift or heave.  The abdomen is soft and nontender.  The bowel sounds are normal.  The liver and spleen are not enlarged.  There are no abdominal masses.  There are no  abdominal bruits.  Extremities reveal good pedal pulses.  There is no phlebitis or edema.  There is no cyanosis or clubbing.  Strength is normal and symmetrical in all extremities.  There is no lateralizing weakness.  There are no sensory deficits.  The skin is warm and dry.  There is no rash.   Assessment / Plan: 1. hypertensive heart disease without heart failure 2. Dyslipidemia 3. diabetes mellitus 4. frequent PACs 5. prostate cancer with elevated PSA  Continue on same medication.  Lab work today pending.  Recheck in 4 months for office visit and fasting lab work

## 2013-12-16 ENCOUNTER — Telehealth: Payer: Self-pay | Admitting: *Deleted

## 2013-12-16 ENCOUNTER — Other Ambulatory Visit: Payer: Self-pay | Admitting: *Deleted

## 2013-12-16 DIAGNOSIS — Z79899 Other long term (current) drug therapy: Secondary | ICD-10-CM

## 2013-12-16 DIAGNOSIS — R7989 Other specified abnormal findings of blood chemistry: Secondary | ICD-10-CM

## 2013-12-16 DIAGNOSIS — R945 Abnormal results of liver function studies: Principal | ICD-10-CM

## 2013-12-16 NOTE — Telephone Encounter (Signed)
Advised patient of lab results and medication changes  °

## 2013-12-16 NOTE — Telephone Encounter (Signed)
Message copied by Earvin Hansen on Mon Dec 16, 2013  6:14 PM ------      Message from: Darlin Coco      Created: Sun Dec 15, 2013  7:38 PM       Please report. Since last time the liver tests are much higher. Stop lipitor now. Recheck lipid panel and hepatic function tests in 1 month. Send copy of labs to Dr. Dwyane Dee and [name]. ------

## 2013-12-24 ENCOUNTER — Other Ambulatory Visit: Payer: Self-pay | Admitting: *Deleted

## 2013-12-30 ENCOUNTER — Other Ambulatory Visit: Payer: Self-pay | Admitting: *Deleted

## 2013-12-30 MED ORDER — LIRAGLUTIDE 18 MG/3ML ~~LOC~~ SOPN: 1.8000 mg | PEN_INJECTOR | Freq: Every day | SUBCUTANEOUS | Status: DC

## 2013-12-31 ENCOUNTER — Other Ambulatory Visit: Payer: Self-pay | Admitting: *Deleted

## 2013-12-31 MED ORDER — INSULIN ASPART 100 UNIT/ML ~~LOC~~ SOLN: 9.0000 [IU] | Freq: Three times a day (TID) | SUBCUTANEOUS | Status: DC

## 2014-01-15 ENCOUNTER — Telehealth: Payer: Self-pay | Admitting: Endocrinology

## 2014-01-15 NOTE — Telephone Encounter (Signed)
Patient would like medication refilled   Metformin  Glipizide Actos  Test strips freestyle light meter   Pharm: [facility] [address]   Thank You

## 2014-01-16 ENCOUNTER — Other Ambulatory Visit (INDEPENDENT_AMBULATORY_CARE_PROVIDER_SITE_OTHER): Payer: Medicare Other

## 2014-01-16 ENCOUNTER — Other Ambulatory Visit: Payer: Medicare Other

## 2014-01-16 ENCOUNTER — Other Ambulatory Visit: Payer: Self-pay | Admitting: *Deleted

## 2014-01-16 ENCOUNTER — Ambulatory Visit (INDEPENDENT_AMBULATORY_CARE_PROVIDER_SITE_OTHER): Payer: Medicare Other | Admitting: Endocrinology

## 2014-01-16 ENCOUNTER — Encounter: Payer: Self-pay | Admitting: Endocrinology

## 2014-01-16 VITALS — BP 154/67 | HR 95 | Temp 98.4°F | Resp 16 | Ht 71.0 in | Wt 225.0 lb

## 2014-01-16 DIAGNOSIS — N182 Chronic kidney disease, stage 2 (mild): Secondary | ICD-10-CM

## 2014-01-16 DIAGNOSIS — E1165 Type 2 diabetes mellitus with hyperglycemia: Principal | ICD-10-CM

## 2014-01-16 DIAGNOSIS — Z79899 Other long term (current) drug therapy: Secondary | ICD-10-CM

## 2014-01-16 DIAGNOSIS — IMO0001 Reserved for inherently not codable concepts without codable children: Secondary | ICD-10-CM

## 2014-01-16 DIAGNOSIS — E785 Hyperlipidemia, unspecified: Secondary | ICD-10-CM

## 2014-01-16 DIAGNOSIS — R7989 Other specified abnormal findings of blood chemistry: Secondary | ICD-10-CM

## 2014-01-16 DIAGNOSIS — I10 Essential (primary) hypertension: Secondary | ICD-10-CM

## 2014-01-16 DIAGNOSIS — R945 Abnormal results of liver function studies: Secondary | ICD-10-CM

## 2014-01-16 LAB — COMPREHENSIVE METABOLIC PANEL
ALT: 15 U/L (ref 0–53)
AST: 19 U/L (ref 0–37)
Albumin: 3.1 g/dL — ABNORMAL LOW (ref 3.5–5.2)
Alkaline Phosphatase: 65 U/L (ref 39–117)
BILIRUBIN TOTAL: 0.5 mg/dL (ref 0.2–1.2)
BUN: 37 mg/dL — ABNORMAL HIGH (ref 6–23)
CO2: 21 mEq/L (ref 19–32)
Calcium: 9.2 mg/dL (ref 8.4–10.5)
Chloride: 110 mEq/L (ref 96–112)
Creatinine, Ser: 1.5 mg/dL (ref 0.4–1.5)
GFR: 56.74 mL/min — ABNORMAL LOW (ref >60.00)
Glucose, Bld: 160 mg/dL — ABNORMAL HIGH (ref 70–99)
Potassium: 4.3 mEq/L (ref 3.5–5.1)
SODIUM: 139 meq/L (ref 135–145)
TOTAL PROTEIN: 6.2 g/dL (ref 6.0–8.3)

## 2014-01-16 LAB — LIPID PANEL
CHOLESTEROL: 173 mg/dL (ref 0–200)
Cholesterol: 170 mg/dL (ref 0–200)
HDL: 42.9 mg/dL (ref >39.00)
HDL: 44.6 mg/dL (ref >39.00)
LDL CALC: 107 mg/dL — AB (ref 0–99)
LDL Cholesterol: 112 mg/dL — ABNORMAL HIGH (ref 0–99)
NONHDL: 130.1
NONHDL: 125.4
Total CHOL/HDL Ratio: 4
Total CHOL/HDL Ratio: 4
Triglycerides: 92 mg/dL (ref 0.0–149.0)
Triglycerides: 90 mg/dL (ref 0.0–149.0)
VLDL: 18.4 mg/dL (ref 0.0–40.0)
VLDL: 18 mg/dL (ref 0.0–40.0)

## 2014-01-16 LAB — HEPATIC FUNCTION PANEL
ALT: 14 U/L (ref 0–53)
AST: 19 U/L (ref 0–37)
Albumin: 3.2 g/dL — ABNORMAL LOW (ref 3.5–5.2)
Alkaline Phosphatase: 63 U/L (ref 39–117)
BILIRUBIN DIRECT: 0 mg/dL (ref 0.0–0.3)
BILIRUBIN TOTAL: 0.5 mg/dL (ref 0.2–1.2)
Total Protein: 6.6 g/dL (ref 6.0–8.3)

## 2014-01-16 LAB — HEMOGLOBIN A1C: HEMOGLOBIN A1C: 8.6 % — AB (ref 4.6–6.5)

## 2014-01-16 MED ORDER — GLIPIZIDE ER 5 MG PO TB24: 5.0000 mg | ORAL_TABLET | Freq: Every day | ORAL | Status: DC

## 2014-01-16 MED ORDER — PIOGLITAZONE HCL 30 MG PO TABS: 30.0000 mg | ORAL_TABLET | Freq: Every day | ORAL | Status: DC

## 2014-01-16 MED ORDER — METFORMIN HCL 500 MG PO TABS: 500.0000 mg | ORAL_TABLET | Freq: Three times a day (TID) | ORAL | Status: DC

## 2014-01-16 NOTE — Progress Notes (Signed)
Patient ID: Jeremy Jennings, male   DOB: 02/05/1936, 78 y.o.   MRN: [id number]   Reason for Appointment: Diabetes follow-up   History of Present Illness    Diagnosis: Type 2 DIABETES MELITUS, date of diagnosis:  1981        PREVIOUS history: He has had long-standing diabetes treated with multiple drugs over the years and still continues to take a multidrug regimen He was started on Victoza in 2010 somewhat better diabetes control and better weight However because of tendency to high postprandial readings and higher A1c was also given mealtime coverage with NovoLog about 2 years ago  Because of his A1c going up to 8.3 in 03/2013 his Victoza was increased from 1.2 up to 1.8 mg  RECENT history:   He has not been seen in followup since 1/15 His blood sugars appear to be overall significantly worse with increased A1c He blames the high sugars on not remembering to take his metformin in the evenings and also he thinks that his insulin may be going back if he is taking with him outside in the heat However his fasting blood sugars are also significantly high overall Previously since he had a relatively good creatinine of 1.4 his metformin was increased to 1500 mg and this has helped his control.  More recently has started having low sugars around supper time, mostly right after eating He has increase his NovoLog slightly all his meals because of higher readings Overall his highest blood sugars are after lunch Postprandial readings after her evening meal are relatively lower in he thinks if he takes metformin his blood sugars will be getting low at bedtime  Oral hypoglycemic drugs: Actos, metformin qd-bid, glipizide        Side effects from medications: None Insulin regimen: 10-12 before meals of NovoLog       Proper timing of medications in relation to meals: Yes.          Monitors blood glucose:  2.5 times a day.    Glucometer:  FreeStyle         Blood Glucose readings from meter download as  follows; time on the monitor is incorrect and not clear which readings are postprandial  PREMEAL Breakfast Lunch Dinner Bedtime Overall  Glucose range:  109-278       Mean/median:  182     174   POST-MEAL PC Breakfast PC Lunch PC Dinner  Glucose range:   90-284   55-176   Mean/median:      Meals: 3 meals per day. trying to watch portions better          Physical activity: exercise: Some farming and outside activities, some walking at malls           Dietician visit: Most recent: 0/27           Complications: are: None     Wt Readings from Last 3 Encounters:  01/16/14 225 lb (102.059 kg)  12/13/13 224 lb 12.8 oz (101.969 kg)  11/20/13 224 lb 4.8 oz (101.742 kg)   LABS:   Lab Results  Component Value Date   HGBA1C 8.6* 01/16/2014   HGBA1C 7.3* 07/19/2013   HGBA1C 8.3* 03/12/2013   Lab Results  Component Value Date   MICROALBUR 2.8* 03/12/2013   LDLCALC 107* 01/16/2014   CREATININE 1.5 01/16/2014    Appointment on 01/16/2014  Component Date Value Ref Range Status  . Hemoglobin A1C 01/16/2014 8.6* 4.6 - 6.5 % Final   Glycemic Control Guidelines  for People with Diabetes:Non Diabetic:  <6%Goal of Therapy: <7%Additional Action Suggested:  >8%   . Sodium 01/16/2014 139  135 - 145 mEq/L Final  . Potassium 01/16/2014 4.3  3.5 - 5.1 mEq/L Final  . Chloride 01/16/2014 110  96 - 112 mEq/L Final  . CO2 01/16/2014 21  19 - 32 mEq/L Final  . Glucose, Bld 01/16/2014 160* 70 - 99 mg/dL Final  . BUN 01/16/2014 37* 6 - 23 mg/dL Final  . Creatinine, Ser 01/16/2014 1.5  0.4 - 1.5 mg/dL Final  . Total Bilirubin 01/16/2014 0.5  0.2 - 1.2 mg/dL Final  . Alkaline Phosphatase 01/16/2014 65  39 - 117 U/L Final  . AST 01/16/2014 19  0 - 37 U/L Final  . ALT 01/16/2014 15  0 - 53 U/L Final  . Total Protein 01/16/2014 6.2  6.0 - 8.3 g/dL Final  . Albumin 01/16/2014 3.1* 3.5 - 5.2 g/dL Final  . Calcium 01/16/2014 9.2  8.4 - 10.5 mg/dL Final  . GFR 01/16/2014 56.74* >60.00 mL/min Final  . Cholesterol  01/16/2014 173  0 - 200 mg/dL Final   ATP III Classification       Desirable:  < 200 mg/dL               Borderline High:  200 - 239 mg/dL          High:  > = 240 mg/dL  . Triglycerides 01/16/2014 92.0  0.0 - 149.0 mg/dL Final   Normal:  <150 mg/dLBorderline High:  150 - 199 mg/dL  . HDL 01/16/2014 42.90  >39.00 mg/dL Final  . VLDL 01/16/2014 18.4  0.0 - 40.0 mg/dL Final  . LDL Cholesterol 01/16/2014 112* 0 - 99 mg/dL Final  . Total CHOL/HDL Ratio 01/16/2014 4   Final                  Men          Women1/2 Average Risk     3.4          3.3Average Risk          5.0          4.42X Average Risk          9.6          7.13X Average Risk          15.0          11.0                      . NonHDL 01/16/2014 130.10   Final  Appointment on 01/16/2014  Component Date Value Ref Range Status  . Cholesterol 01/16/2014 170  0 - 200 mg/dL Final   ATP III Classification       Desirable:  < 200 mg/dL               Borderline High:  200 - 239 mg/dL          High:  > = 240 mg/dL  . Triglycerides 01/16/2014 90.0  0.0 - 149.0 mg/dL Final   Normal:  <150 mg/dLBorderline High:  150 - 199 mg/dL  . HDL 01/16/2014 44.60  >39.00 mg/dL Final  . VLDL 01/16/2014 18.0  0.0 - 40.0 mg/dL Final  . LDL Cholesterol 01/16/2014 107* 0 - 99 mg/dL Final  . Total CHOL/HDL Ratio 01/16/2014 4   Final  Men          Women1/2 Average Risk     3.4          3.3Average Risk          5.0          4.42X Average Risk          9.6          7.13X Average Risk          15.0          11.0                      . NonHDL 01/16/2014 125.40   Final  . Total Bilirubin 01/16/2014 0.5  0.2 - 1.2 mg/dL Final  . Bilirubin, Direct 01/16/2014 0.0  0.0 - 0.3 mg/dL Final  . Alkaline Phosphatase 01/16/2014 63  39 - 117 U/L Final  . AST 01/16/2014 19  0 - 37 U/L Final  . ALT 01/16/2014 14  0 - 53 U/L Final  . Total Protein 01/16/2014 6.6  6.0 - 8.3 g/dL Final  . Albumin 01/16/2014 3.2* 3.5 - 5.2 g/dL Final      Medication List       This  list is accurate as of: 01/16/14  4:35 PM.  Always use your most recent med list.               Aliskiren-Hydrochlorothiazide 150-12.5 MG Tabs  Commonly known as:  TEKTURNA HCT  Take 1 tablet daily by mouth     aspirin 500 MG EC tablet  Take 500 mg by mouth every 6 (six) hours as needed.     atorvastatin 40 MG tablet  Commonly known as:  LIPITOR     COMBIGAN 0.2-0.5 % ophthalmic solution  Generic drug:  brimonidine-timolol     diltiazem 360 MG 24 hr capsule  Commonly known as:  TIAZAC  Take 360 mg by mouth daily.     FREESTYLE LITE test strip  Generic drug:  glucose blood     FREESTYLE LITE TEST VI     glipiZIDE 5 MG 24 hr tablet  Commonly known as:  GLUCOTROL XL  Take 1 tablet (5 mg total) by mouth daily.     hydrochlorothiazide 12.5 MG tablet  Commonly known as:  HYDRODIURIL  Take 12.5 mg by mouth daily.     insulin aspart 100 UNIT/ML injection  Commonly known as:  novoLOG  Inject 9-10 Units into the skin 3 (three) times daily before meals. Sliding scale     Insulin Pen Needle 31G X 5 MM Misc  Commonly known as:  B-D UF III MINI PEN NEEDLES  1 each by Does not apply route 2 (two) times daily.     latanoprost 0.005 % ophthalmic solution  Commonly known as:  XALATAN  Place 0.005 % into the left eye as directed.     levofloxacin 500 MG tablet  Commonly known as:  LEVAQUIN     Liraglutide 18 MG/3ML Sopn  Inject 1.8 mg into the skin daily.     megestrol 20 MG tablet  Commonly known as:  MEGACE  Take 20 mg by mouth 2 (two) times daily.     metFORMIN 500 MG tablet  Commonly known as:  GLUCOPHAGE  Take 1 tablet (500 mg total) by mouth 3 (three) times daily.     pioglitazone 30 MG tablet  Commonly known as:  ACTOS  Take 1 tablet (30 mg total) by  mouth daily.     prednisoLONE acetate 1 % ophthalmic suspension  Commonly known as:  PRED FORTE  Place into both eyes 2 (two) times daily.     tamsulosin 0.4 MG Caps capsule  Commonly known as:  FLOMAX      VITAMIN D PO  Take by mouth daily. Taking 5000 daily        Allergies: No Known Allergies  Past Medical History  Diagnosis Date  . Hypertension   . Diabetes mellitus   . Heart murmur     SYSTOLIC MURMUR AT BASE  . Hyperlipidemia   . Prostate cancer     Past Surgical History  Procedure Laterality Date  . Tonsillectomy      IN [facility]  . Hemorroidectomy  1969  . Hernia repair  AFE [age]  . Hernia repair  1981    DR WEATHERLY    No family history on file.  Social History:  reports that he quit smoking about 34 years ago. His smoking use included Cigarettes. He has a 6 pack-year smoking history. He has never used smokeless tobacco. He reports that he does not drink alcohol or use illicit drugs.  Review of Systems:  HYPERTENSION:  well controlled usually, followed by cardiologist  HYPERLIPIDEMIA: The lipid abnormality consists of elevated LDL, on Lipitor for several years but because of high liver functions he has been taken off the 40 mg dose, to be started on 10 mg now  Lab Results  Component Value Date   CHOL 170 01/16/2014   HDL 44.60 01/16/2014   LDLCALC 107* 01/16/2014   TRIG 90.0 01/16/2014   CHOLHDL 4 01/16/2014     Chronic renal insufficiency: His creatinine is relatively stable at 1.5     Examination:   BP 154/67  Pulse 95  Temp(Src) 98.4 F (36.9 C)  Resp 16  Ht 5\' 11"  (1.803 m)  Wt 225 lb (102.059 kg)  BMI 31.39 kg/m2  SpO2 96%  Body mass index is 31.39 kg/(m^2).    no leg edema  ASSESSMENT/ PLAN:   Diabetes type 2, poorly controlled   The patient's diabetes  control is overall worse as discussed in history of present illness Although he had benefit initially from increasing metformin he has a significantly high A1c now Most likely has had some progression of his diabetes and insulin deficiency He is reluctant to add another insulin dose of basal insulin at this time Meanwhile will have him take his 1000 mg metformin consistently at suppertime so he does  not forget  He will also reduce his supper time NovoLog to compensate for this; he should avoid hypoglycemia He will try to keep his NovoLog away from heat exposure If control is not better with adding evening metformin will consider adding bedtime Levemir He will try to be more consistent with diet  Needs to do more readings after supper and review blood sugars again in 6 weeks  Hypertension: Followed by PCP, blood pressure is relatively higher today  Mild CKD: Creatinine upper normal and stable  Counseling time over 50% of today's 25 minute visit   Brenen Beigel 01/16/2014, 4:35 PM

## 2014-01-16 NOTE — Telephone Encounter (Signed)
rx faxed

## 2014-01-16 NOTE — Progress Notes (Signed)
Quick Note:  Please report to patient. The recent labs are stable. Continue same medication and careful diet. The liver function studies which previously were elevated have returned to normal. He has been off his 40 mg Lipitor since early June. His LDL and his non-HDL are above target for a diabetic. I would like him to restart Lipitor in a smaller dose of 10 mg daily. Recheck hepatic function panel one month after starting Lipitor. ______

## 2014-01-16 NOTE — Patient Instructions (Signed)
Metformin 2 at supper, may try 8 Novolog at supper unless eating large meal

## 2014-01-17 ENCOUNTER — Other Ambulatory Visit: Payer: Self-pay | Admitting: *Deleted

## 2014-01-17 ENCOUNTER — Telehealth: Payer: Self-pay | Admitting: *Deleted

## 2014-01-17 DIAGNOSIS — Z79899 Other long term (current) drug therapy: Secondary | ICD-10-CM

## 2014-01-17 MED ORDER — GLUCOSE BLOOD VI STRP: ORAL_STRIP | Status: DC

## 2014-01-17 MED ORDER — ATORVASTATIN CALCIUM 10 MG PO TABS: 10.0000 mg | ORAL_TABLET | Freq: Every day | ORAL | Status: DC

## 2014-01-17 NOTE — Telephone Encounter (Signed)
Message copied by Claude Manges on Fri Jan 17, 2014  3:46 PM ------      Message from: Darlin Coco      Created: Thu Jan 16, 2014  2:09 PM       Please report to patient.  The recent labs are stable. Continue same medication and careful diet.  The liver function studies which previously were elevated have returned to normal.  He has been off his 40 mg Lipitor since early June.  His LDL and his non-HDL are above target for a diabetic.  I would like him to restart Lipitor in a smaller dose of 10 mg daily.  Recheck hepatic function panel one month after starting Lipitor. ------

## 2014-01-17 NOTE — Telephone Encounter (Signed)
spoke to  patient about labs and medication changes with lab work

## 2014-01-27 ENCOUNTER — Other Ambulatory Visit: Payer: Self-pay | Admitting: *Deleted

## 2014-01-27 MED ORDER — LIRAGLUTIDE 18 MG/3ML ~~LOC~~ SOPN: 1.8000 mg | PEN_INJECTOR | Freq: Every day | SUBCUTANEOUS | Status: DC

## 2014-01-30 ENCOUNTER — Other Ambulatory Visit: Payer: Self-pay | Admitting: *Deleted

## 2014-01-30 MED ORDER — INSULIN ASPART 100 UNIT/ML ~~LOC~~ SOLN: 9.0000 [IU] | Freq: Three times a day (TID) | SUBCUTANEOUS | Status: DC

## 2014-02-11 ENCOUNTER — Other Ambulatory Visit: Payer: Self-pay | Admitting: *Deleted

## 2014-02-11 MED ORDER — ALISKIREN-HYDROCHLOROTHIAZIDE 150-12.5 MG PO TABS: ORAL_TABLET | ORAL | Status: DC

## 2014-02-13 ENCOUNTER — Encounter: Payer: Self-pay | Admitting: Endocrinology

## 2014-02-13 ENCOUNTER — Ambulatory Visit (INDEPENDENT_AMBULATORY_CARE_PROVIDER_SITE_OTHER): Payer: Medicare Other | Admitting: Endocrinology

## 2014-02-13 VITALS — BP 121/60 | HR 99 | Temp 98.6°F | Resp 16 | Ht 71.0 in | Wt 223.2 lb

## 2014-02-13 DIAGNOSIS — M76892 Other specified enthesopathies of left lower limb, excluding foot: Secondary | ICD-10-CM

## 2014-02-13 DIAGNOSIS — E1129 Type 2 diabetes mellitus with other diabetic kidney complication: Secondary | ICD-10-CM

## 2014-02-13 DIAGNOSIS — I10 Essential (primary) hypertension: Secondary | ICD-10-CM

## 2014-02-13 DIAGNOSIS — E1165 Type 2 diabetes mellitus with hyperglycemia: Principal | ICD-10-CM

## 2014-02-13 DIAGNOSIS — M76899 Other specified enthesopathies of unspecified lower limb, excluding foot: Secondary | ICD-10-CM

## 2014-02-13 MED ORDER — INSULIN DETEMIR 100 UNIT/ML FLEXPEN: 10.0000 [IU] | PEN_INJECTOR | Freq: Every day | SUBCUTANEOUS | Status: DC

## 2014-02-13 NOTE — Progress Notes (Signed)
Patient ID: KEL SENN, male   DOB: 1936-02-06, 78 y.o.   MRN: [id number]   Reason for Appointment: Diabetes follow-up   History of Present Illness    Diagnosis: Type 2 DIABETES MELITUS, date of diagnosis:  1981        PREVIOUS history: He has had long-standing diabetes treated with multiple drugs over the years and still continues to take a multidrug regimen He was started on Victoza in 2010 somewhat better diabetes control and better weight However because of tendency to high postprandial readings and higher A1c was also given mealtime coverage with NovoLog about 2 years ago  Because of his A1c going up to 8.3 in 03/2013 his Victoza was increased from 1.2 up to 1.8 mg  RECENT history:   His blood sugars were overall significantly worse with increased A1c in 7/15 He had some difficulties with compliance with metformin and not sure if his insulin was affected by the heat He was told to increase evening metformin to 1000 mg and also reduce suppertime NovoLog if needed to avoid hypoglycemia His blood sugars have been markedly variable since his last visit Has had periodic blood sugars of over 300 also which he thinks were partly from traveling and occasionally missing his insulin More recently has not checked enough readings after meals but they are improved However he has consistently high fasting blood sugars  Oral hypoglycemic drugs: Actos, metformin qd-bid, glipizide        Side effects from medications: None Insulin regimen: 10-12 before meals of NovoLog       Proper timing of medications in relation to meals: Yes.          Monitors blood glucose:  2.5 times a day.    Glucometer:  FreeStyle         Blood Glucose readings recently from meter download as follows  PREMEAL Breakfast Lunch Dinner Bedtime Overall  Glucose range:  127-190    88-192     Mean/median:  177      168    POST-MEAL PC Breakfast PC Lunch PC Dinner  Glucose range:  49-182    67-229   Mean/median:       Meals: 3 meals per day. 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 Latest known visit with results is:  Appointment on 01/16/2014  Component Date Value Ref Range Status  . Hemoglobin A1C 01/16/2014 8.6* 4.6 - 6.5 % Final   Glycemic Control Guidelines for People with Diabetes:Non Diabetic:  <6%Goal of Therapy: <7%Additional Action Suggested:  >8%   . Sodium 01/16/2014 139  135 - 145 mEq/L Final  . Potassium 01/16/2014 4.3  3.5 - 5.1 mEq/L Final  .  Chloride 01/16/2014 110  96 - 112 mEq/L Final  . CO2 01/16/2014 21  19 - 32 mEq/L Final  . Glucose, Bld 01/16/2014 160* 70 - 99 mg/dL Final  . BUN 01/16/2014 37* 6 - 23 mg/dL Final  . Creatinine, Ser 01/16/2014 1.5  0.4 - 1.5 mg/dL Final  . Total Bilirubin 01/16/2014 0.5  0.2 - 1.2 mg/dL Final  . Alkaline Phosphatase 01/16/2014 65  39 - 117 U/L Final  . AST 01/16/2014 19  0 - 37 U/L Final  . ALT 01/16/2014 15  0 - 53 U/L Final  . Total Protein 01/16/2014 6.2  6.0 - 8.3 g/dL Final  . Albumin 01/16/2014 3.1* 3.5 - 5.2 g/dL Final  . Calcium 01/16/2014 9.2  8.4 - 10.5 mg/dL Final  . GFR 01/16/2014 56.74* >60.00 mL/min Final  . Cholesterol 01/16/2014 173  0 - 200 mg/dL Final   ATP III Classification       Desirable:  < 200 mg/dL               Borderline High:  200 - 239 mg/dL          High:  > = 240 mg/dL  . Triglycerides 01/16/2014 92.0   0.0 - 149.0 mg/dL Final   Normal:  <150 mg/dLBorderline High:  150 - 199 mg/dL  . HDL 01/16/2014 42.90  >39.00 mg/dL Final  . VLDL 01/16/2014 18.4  0.0 - 40.0 mg/dL Final  . LDL Cholesterol 01/16/2014 112* 0 - 99 mg/dL Final  . Total CHOL/HDL Ratio 01/16/2014 4   Final                  Men          Women1/2 Average Risk     3.4          3.3Average Risk          5.0          4.42X Average Risk          9.6          7.13X Average Risk          15.0          11.0                      . NonHDL 01/16/2014 130.10   Final      Medication List       This list is accurate as of: 02/13/14  3:11 PM.  Always use your most recent med list.               Aliskiren-Hydrochlorothiazide 150-12.5 MG Tabs  Commonly known as:  TEKTURNA HCT  Take 1 tablet daily by mouth     aspirin 500 MG EC tablet  Take 500 mg by mouth every 6 (six) hours as needed.     atorvastatin 10 MG tablet  Commonly known as:  LIPITOR  Take 1 tablet (10 mg total) by mouth daily.     COMBIGAN 0.2-0.5 % ophthalmic solution  Generic drug:  brimonidine-timolol     diltiazem 360 MG 24 hr capsule  Commonly known as:  TIAZAC  Take 360 mg by mouth daily.     FREESTYLE LITE test strip  Generic drug:  glucose blood     glucose blood test strip  Commonly known as:  FREESTYLE LITE  Use as instructed to check blood sugar 2 times per day dx code 250.02     glipiZIDE  5 MG 24 hr tablet  Commonly known as:  GLUCOTROL XL  Take 1 tablet (5 mg total) by mouth daily.     hydrochlorothiazide 12.5 MG tablet  Commonly known as:  HYDRODIURIL  Take 12.5 mg by mouth daily.     insulin aspart 100 UNIT/ML injection  Commonly known as:  novoLOG  Inject 9-10 Units into the skin 3 (three) times daily before meals. Sliding scale     Insulin Pen Needle 31G X 5 MM Misc  Commonly known as:  B-D UF III MINI PEN NEEDLES  1 each by Does not apply route 2 (two) times daily.     latanoprost 0.005 % ophthalmic solution  Commonly known as:   XALATAN  Place 0.005 % into the left eye as directed.     levofloxacin 500 MG tablet  Commonly known as:  LEVAQUIN     Liraglutide 18 MG/3ML Sopn  Inject 1.8 mg into the skin daily.     megestrol 20 MG tablet  Commonly known as:  MEGACE  Take 20 mg by mouth 2 (two) times daily.     metFORMIN 500 MG tablet  Commonly known as:  GLUCOPHAGE  Take 1 tablet (500 mg total) by mouth 3 (three) times daily.     pioglitazone 30 MG tablet  Commonly known as:  ACTOS  Take 1 tablet (30 mg total) by mouth daily.     prednisoLONE acetate 1 % ophthalmic suspension  Commonly known as:  PRED FORTE  Place into both eyes 2 (two) times daily.     tamsulosin 0.4 MG Caps capsule  Commonly known as:  FLOMAX     VITAMIN D PO  Take by mouth daily. Taking 5000 daily        Allergies: No Known Allergies  Past Medical History  Diagnosis Date  . Hypertension   . Diabetes mellitus   . Heart murmur     SYSTOLIC MURMUR AT BASE  . Hyperlipidemia   . Prostate cancer     Past Surgical History  Procedure Laterality Date  . Tonsillectomy      IN [facility]  . Hemorroidectomy  1969  . Hernia repair  AFE 18-17  . Hernia repair  1981    DR WEATHERLY    No family history on file.  Social History:  reports that he quit smoking about 34 years ago. His smoking use included Cigarettes. He has a 6 pack-year smoking history. He has never used smokeless tobacco. He reports that he does not drink alcohol or use illicit drugs.  Review of Systems:  He is asking about left inner thigh pain which is present mostly late in the evenings and sometimes on waking up He has taken some over-the-counter arthritis tablet, not sure where this contains with some relief. Has not followed up with PCP  HYPERTENSION:  well controlled usually, followed by cardiologist  HYPERLIPIDEMIA: The lipid abnormality consists of elevated LDL, on Lipitor for several years but because of high liver functions he has been taken off the 40 mg  dose, to be started on 10 mg now  Lab Results  Component Value Date   CHOL 170 01/16/2014   HDL 44.60 01/16/2014   LDLCALC 107* 01/16/2014   TRIG 90.0 01/16/2014   CHOLHDL 4 01/16/2014     Chronic renal insufficiency: His creatinine is relatively stable  Lab Results  Component Value Date   CREATININE 1.5 01/16/2014        Examination:   BP 121/60  Pulse  99  Temp(Src) 98.6 F (37 C)  Resp 16  Ht 5\' 11"  (1.803 m)  Wt 223 lb 3.2 oz (101.243 kg)  BMI 31.14 kg/m2  SpO2 97%  Body mass index is 31.14 kg/(m^2).    no pedal edema  ASSESSMENT/ PLAN:   Diabetes type 2, poorly controlled   The patient's diabetes  control is is variable but even though he has increased his metformin his fasting blood sugars are consistently high He is still on multiple drugs for his diabetes including Victoza He has had variable diet because of periodically traveling and has had occasional very high readings; with traveling his compliance with medications is also not consistent His weight is down a couple pounds although he is not doing any formal exercise Discussed that his mealtime insulin is not helping his overnight blood sugars and needs of basal insulin He does not want to take a syringe and his insurance does not cover the Humulin NPH pen Recommendations:  He will start taking 8 units Levemir in the evening  Discussed how they Levemir insulin pen works, timing of the injection, duration of action of insulin  He was given a flow sheet to help titrate the dose by 2 units every 3 days to get morning sugars under 130  Most likely will need less mealtime coverage at breakfast at least  He will check more readings after meals also  He will stop his glipizide when out  Hypertension: Followed by PCP, blood pressure is better today  Mild CKD: Creatinine will need to be followed closely  His leg pain is likely to be from hip abductor tendinitis  Patient Instructions  May take OTC Tylenol arthritis  and use topical salicylate creams for leg pain     Counseling time over 50% of today's 25 minute visit   Donn Wilmot 02/13/2014, 3:11 PM

## 2014-02-13 NOTE — Patient Instructions (Addendum)
May take OTC Tylenol arthritis and use topical salicylate creams for leg pain  Levemir 8 units in the evening and adjust every 3 days to get morning sugar below 130 as discussed May need to reduce breakfast insulin by 2 units Stop glipizide when finished Continue metformin unchanged

## 2014-02-18 ENCOUNTER — Telehealth: Payer: Self-pay | Admitting: Cardiology

## 2014-02-18 NOTE — Telephone Encounter (Signed)
Called patient back and he is complaining of pain in the thigh of his left leg front and back for several days. States he is having a lot of difficulty walking. Saw Dr.Kumar recently and he told him that it was probably tendonitis. Advised will discuss with Dr.Brackbill and call him back.

## 2014-02-18 NOTE — Telephone Encounter (Signed)
New message          Pt has append initis in his left groin and would like for melinda to give him call

## 2014-02-18 NOTE — Telephone Encounter (Signed)
Discussed with Dr.Brackbill and he advised that the patient go to an Urgent [place] to be evaluated. Patient aware of above and verbalized understanding.

## 2014-03-24 ENCOUNTER — Other Ambulatory Visit (INDEPENDENT_AMBULATORY_CARE_PROVIDER_SITE_OTHER): Payer: Medicare Other

## 2014-03-24 DIAGNOSIS — E1165 Type 2 diabetes mellitus with hyperglycemia: Principal | ICD-10-CM

## 2014-03-24 DIAGNOSIS — E1129 Type 2 diabetes mellitus with other diabetic kidney complication: Secondary | ICD-10-CM

## 2014-03-24 LAB — COMPREHENSIVE METABOLIC PANEL
ALK PHOS: 81 U/L (ref 39–117)
ALT: 14 U/L (ref 0–53)
AST: 16 U/L (ref 0–37)
Albumin: 3 g/dL — ABNORMAL LOW (ref 3.5–5.2)
BILIRUBIN TOTAL: 0.1 mg/dL — AB (ref 0.2–1.2)
BUN: 25 mg/dL — AB (ref 6–23)
CO2: 20 mEq/L (ref 19–32)
Calcium: 8.9 mg/dL (ref 8.4–10.5)
Chloride: 109 mEq/L (ref 96–112)
Creatinine, Ser: 1.4 mg/dL (ref 0.4–1.5)
GFR: 61.78 mL/min (ref >60.00)
GLUCOSE: 144 mg/dL — AB (ref 70–99)
Potassium: 4.7 mEq/L (ref 3.5–5.1)
Sodium: 138 mEq/L (ref 135–145)
Total Protein: 6.6 g/dL (ref 6.0–8.3)

## 2014-03-24 LAB — HEMOGLOBIN A1C: Hgb A1c MFr Bld: 8.2 % — ABNORMAL HIGH (ref 4.6–6.5)

## 2014-03-27 ENCOUNTER — Ambulatory Visit (INDEPENDENT_AMBULATORY_CARE_PROVIDER_SITE_OTHER): Payer: Medicare Other | Admitting: Endocrinology

## 2014-03-27 ENCOUNTER — Encounter: Payer: Self-pay | Admitting: Endocrinology

## 2014-03-27 VITALS — BP 121/60 | HR 97 | Temp 98.3°F | Resp 16 | Ht 71.0 in | Wt 221.6 lb

## 2014-03-27 DIAGNOSIS — I10 Essential (primary) hypertension: Secondary | ICD-10-CM

## 2014-03-27 DIAGNOSIS — Z23 Encounter for immunization: Secondary | ICD-10-CM

## 2014-03-27 DIAGNOSIS — E1165 Type 2 diabetes mellitus with hyperglycemia: Principal | ICD-10-CM

## 2014-03-27 DIAGNOSIS — N182 Chronic kidney disease, stage 2 (mild): Secondary | ICD-10-CM

## 2014-03-27 DIAGNOSIS — E1129 Type 2 diabetes mellitus with other diabetic kidney complication: Secondary | ICD-10-CM

## 2014-03-27 NOTE — Progress Notes (Signed)
Patient ID: Jeremy Jennings, male   DOB: [birth year], 78 y.o.   MRN: [id number]   Reason for Appointment: Diabetes follow-up   History of Present Illness    Diagnosis: Type 2 DIABETES MELITUS, date of diagnosis:  1981        PREVIOUS history: He has had long-standing diabetes treated with multiple drugs over the years and still continues to take a multidrug regimen He was started on Victoza in 2010 somewhat better diabetes control and better weight However because of tendency to high postprandial readings and higher A1c was also given mealtime coverage with NovoLog about 2 years ago  Because of his A1c going up to 8.3 in 03/2013 his Victoza was increased from 1.2 up to 1.8 mg  RECENT history:   Because of worsening blood sugar control and A1c of 8.6% he was started on Levemir insulin in 8/50 Initially was given 8 units and he has gone up to 10 units Although his blood sugars have been fluctuating he has reasonably good blood sugars in the last several days in the mornings Recently checking blood sugars mostly in the morning His blood sugars later in the day are somewhat erratic with glucoses as low as 45 and as high as 362 Mealtime insulin: He is mostly taking 12 units of NOVOLOG However he may occasionally forget to take his before eating and this will cause hypoglycemia; also probably did not adjust the dose based on his meal size and carbohydrate intake Occasional episode of hypoglycemia may be related to delayed injection after meals especially when eating out Currently still taking metformin but only 1000 mg a day, renal function has been improved Also his weight is slightly better  Oral hypoglycemic drugs: Actos, metformin bid, glipizide ER      Side effects from medications: None Insulin regimen: 10-12 before meals of NovoLog, Levemir 10 hs       Proper timing of medications in relation to meals: Yes.          Monitors blood glucose:  2.5 times a day.    Glucometer:  FreeStyle          Blood Glucose readings recently from meter download as follows  PREMEAL Breakfast 10AM-1PM  Dinner Bedtime Overall  Glucose range: 92-182     95-362   45-155  170-260    Mean/median:  135     229  151   Meals: 3 meals per day. Dinner 6 pm          Physical activity: exercise: Some farming and outside activities, limited by knee joint pain           Dietician visit: Most recent: 9/93           Complications: are: None     Wt Readings from Last 3 Encounters:  03/27/14 221 lb 9.6 oz (100.517 kg)  02/13/14 223 lb 3.2 oz (101.243 kg)  01/16/14 225 lb (102.059 kg)   LABS:   Lab Results  Component Value Date   HGBA1C 8.2* 03/24/2014   HGBA1C 8.6* 01/16/2014   HGBA1C 7.3* 07/19/2013   Lab Results  Component Value Date   MICROALBUR 2.8* 03/12/2013   LDLCALC 107* 01/16/2014   CREATININE 1.4 03/24/2014    Appointment on 03/24/2014  Component Date Value Ref Range Status  . Hemoglobin A1C 03/24/2014 8.2* 4.6 - 6.5 % Final   Glycemic Control Guidelines for People with Diabetes:Non Diabetic:  <6%Goal of Therapy: <7%Additional Action Suggested:  >8%   . Sodium 03/24/2014  138  135 - 145 mEq/L Final  . Potassium 03/24/2014 4.7  3.5 - 5.1 mEq/L Final  . Chloride 03/24/2014 109  96 - 112 mEq/L Final  . CO2 03/24/2014 20  19 - 32 mEq/L Final  . Glucose, Bld 03/24/2014 144* 70 - 99 mg/dL Final  . BUN 03/24/2014 25* 6 - 23 mg/dL Final  . Creatinine, Ser 03/24/2014 1.4  0.4 - 1.5 mg/dL Final  . Total Bilirubin 03/24/2014 0.1* 0.2 - 1.2 mg/dL Final  . Alkaline Phosphatase 03/24/2014 81  39 - 117 U/L Final  . AST 03/24/2014 16  0 - 37 U/L Final  . ALT 03/24/2014 14  0 - 53 U/L Final  . Total Protein 03/24/2014 6.6  6.0 - 8.3 g/dL Final  . Albumin 03/24/2014 3.0* 3.5 - 5.2 g/dL Final  . Calcium 03/24/2014 8.9  8.4 - 10.5 mg/dL Final  . GFR 03/24/2014 61.78  >60.00 mL/min Final      Medication List       This list is accurate as of: 03/27/14  3:16 PM.  Always use your most recent med list.                Aliskiren-Hydrochlorothiazide 150-12.5 MG Tabs  Commonly known as:  TEKTURNA HCT  Take 1 tablet daily by mouth     aspirin 500 MG EC tablet  Take 500 mg by mouth every 6 (six) hours as needed.     atorvastatin 10 MG tablet  Commonly known as:  LIPITOR  Take 1 tablet (10 mg total) by mouth daily.     COMBIGAN 0.2-0.5 % ophthalmic solution  Generic drug:  brimonidine-timolol     diltiazem 360 MG 24 hr capsule  Commonly known as:  TIAZAC  Take 360 mg by mouth daily.     FREESTYLE LITE test strip  Generic drug:  glucose blood     glucose blood test strip  Commonly known as:  FREESTYLE LITE  Use as instructed to check blood sugar 2 times per day dx code 250.02     glipiZIDE 5 MG 24 hr tablet  Commonly known as:  GLUCOTROL XL  Take 1 tablet (5 mg total) by mouth daily.     hydrochlorothiazide 12.5 MG tablet  Commonly known as:  HYDRODIURIL  Take 12.5 mg by mouth daily.     insulin aspart 100 UNIT/ML injection  Commonly known as:  novoLOG  Inject 9-10 Units into the skin 3 (three) times daily before meals. Sliding scale     Insulin Detemir 100 UNIT/ML Pen  Commonly known as:  LEVEMIR FLEXTOUCH  Inject 10 Units into the skin daily at 10 pm.     Insulin Pen Needle 31G X 5 MM Misc  Commonly known as:  B-D UF III MINI PEN NEEDLES  1 each by Does not apply route 2 (two) times daily.     latanoprost 0.005 % ophthalmic solution  Commonly known as:  XALATAN  Place 0.005 % into the left eye as directed.     levofloxacin 500 MG tablet  Commonly known as:  LEVAQUIN     Liraglutide 18 MG/3ML Sopn  Inject 1.8 mg into the skin daily.     megestrol 20 MG tablet  Commonly known as:  MEGACE  Take 20 mg by mouth 2 (two) times daily.     metFORMIN 500 MG tablet  Commonly known as:  GLUCOPHAGE  Take 1 tablet (500 mg total) by mouth 3 (three) times daily.  pioglitazone 30 MG tablet  Commonly known as:  ACTOS  Take 1 tablet (30 mg total) by mouth daily.      prednisoLONE acetate 1 % ophthalmic suspension  Commonly known as:  PRED FORTE  Place into both eyes 2 (two) times daily.     tamsulosin 0.4 MG Caps capsule  Commonly known as:  FLOMAX     VITAMIN D PO  Take by mouth daily. Taking 5000 daily        Allergies: No Known Allergies  Past Medical History  Diagnosis Date  . Hypertension   . Diabetes mellitus   . Heart murmur     SYSTOLIC MURMUR AT BASE  . Hyperlipidemia   . Prostate cancer     Past Surgical History  Procedure Laterality Date  . Tonsillectomy      [facility]  . Hemorroidectomy  1969  . Hernia repair  AFE 16-17  . Hernia repair  1981    DR WEATHERLY    No family history on file.  Social History:  reports that he quit smoking about 34 years ago. His smoking use included Cigarettes. He has a 6 pack-year smoking history. He has never used smokeless tobacco. He reports that he does not drink alcohol or use illicit drugs.  Review of Systems:  He is asking about left inner thigh pain which is present mostly late in the evenings and sometimes on waking up He has taken some over-the-counter arthritis tablet, not sure where this contains with some relief. Has not followed up with PCP  HYPERTENSION:  well controlled usually, followed by cardiologist  HYPERLIPIDEMIA: The lipid abnormality consists of elevated LDL, on Lipitor for several years but because of high liver functions he has been taken off the 40 mg dose and changed to 10 mg by cardiologist, LDL is relatively higher  Lab Results  Component Value Date   CHOL 170 01/16/2014   HDL 44.60 01/16/2014   LDLCALC 107* 01/16/2014   TRIG 90.0 01/16/2014   CHOLHDL 4 01/16/2014     Chronic renal insufficiency: His creatinine is relatively stable  Lab Results  Component Value Date   CREATININE 1.4 03/24/2014   He is taking Tylenol for joint pain. He is going to see his orthopedic surgeon for knee pain     Examination:   BP 121/60  Pulse 97  Temp(Src) 98.3 F (36.8 C)   Resp 16  Ht 5\' 11"  (1.803 m)  Wt 221 lb 9.6 oz (100.517 kg)  BMI 30.92 kg/m2  SpO2 98%  Body mass index is 30.92 kg/(m^2).    no ankle edema  ASSESSMENT/ PLAN:   Diabetes type 2, poorly controlled   He has significantly better fasting blood sugars with adding Levemir insulin at night However he does show significant fluctuation in blood sugars especially in the evenings He has difficulty complying with mealtime insulin as far as the timing of the injection as well as adjusting the dose based on his meal size Most of his low blood sugars are either related to post prandial dosing of insulin or sometimes reduced food intake A1c slightly better at 8.2% Recommendations:  He will  continue 10 units of Levemir and this morning sugars start going up again  No change and metformin  Adjust NovoLog better with various meal sizes  Make sure he takes the NovoLog within 15 minutes of eating and taking with every meal  Increase exercise as tolerated  Does not need to refill glipizide as it is unlikely  to be helping at this stage  Hypertension: Followed by PCP, blood pressure is well-controlled  Mild CKD: Creatinine will need to be checked periodically    Patient Instructions  May leave off Glipizide   Counseling time over 50% of today's 25 minute visit   Joleena Weisenburger 03/27/2014, 3:16 PM

## 2014-03-27 NOTE — Patient Instructions (Signed)
May leave off Glipizide 

## 2014-03-31 ENCOUNTER — Other Ambulatory Visit: Payer: Self-pay | Admitting: *Deleted

## 2014-03-31 MED ORDER — LIRAGLUTIDE 18 MG/3ML ~~LOC~~ SOPN: 1.8000 mg | PEN_INJECTOR | Freq: Every day | SUBCUTANEOUS | Status: DC

## 2014-04-16 ENCOUNTER — Other Ambulatory Visit: Payer: Self-pay | Admitting: *Deleted

## 2014-04-16 MED ORDER — INSULIN PEN NEEDLE 31G X 5 MM MISC: 1.0000 | Freq: Two times a day (BID) | Status: DC

## 2014-04-21 ENCOUNTER — Ambulatory Visit (INDEPENDENT_AMBULATORY_CARE_PROVIDER_SITE_OTHER): Payer: Medicare Other | Admitting: Cardiology

## 2014-04-21 ENCOUNTER — Encounter: Payer: Self-pay | Admitting: Cardiology

## 2014-04-21 VITALS — BP 130/56 | HR 115 | Ht 71.0 in | Wt 222.0 lb

## 2014-04-21 DIAGNOSIS — I491 Atrial premature depolarization: Secondary | ICD-10-CM

## 2014-04-21 DIAGNOSIS — I119 Hypertensive heart disease without heart failure: Secondary | ICD-10-CM

## 2014-04-21 NOTE — Patient Instructions (Signed)
Your physician recommends that you continue on your current medications as directed. Please refer to the Current Medication list given to you today.  Your physician wants you to follow-up in: 4 month ov You will receive a reminder letter in the mail two months in advance. If you don't receive a letter, please call our office to schedule the follow-up appointment.  

## 2014-04-21 NOTE — Assessment & Plan Note (Signed)
The patient has not been experiencing any hypoglycemic episodes.

## 2014-04-21 NOTE — Assessment & Plan Note (Signed)
His blood pressure has been remaining stable on current therapy.  No symptoms of heart failure

## 2014-04-21 NOTE — Assessment & Plan Note (Signed)
The patient has not been any chest pain.  No palpitations.

## 2014-04-21 NOTE — Progress Notes (Signed)
Jeremy Jennings Date of Birth:  20-Sep-1935 [facility] [address] [place], [place]  [address] [phone]        Fax   [phone]   History of Present Illness: This pleasant 78 year old African American gentleman is seen for a scheduled four-month followup office visit.  He has a past history of hypertension and diabetes.  He has a history of prostate cancer and is being followed by Dr. Festus Aloe. He had a normal treadmill Cardiolite stress test in 2005. He has had a history of mild cardiomegaly and an echocardiogram in 2007 showed mild LVH with normal systolic function and with diastolic dysfunction. The patient gets some exercise. He enjoys gardening.  He has had some pain in his left upper thigh and Dr. Gladstone Lighter, his orthopedist, has ordered a radioisotope bone scan. He has not been aware of any palpitations. In April 2014 he had cataract surgery on his right eye. He has had a reasonable good result but is still having some speckles in front of his eye and is now also seeing a retinal specialist. There is been no evidence of any diabetic retinopathy. Since last visit he has had no new cardiac symptoms.  Denies any chest pain or shortness of breath.   Current Outpatient Prescriptions  Medication Sig Dispense Refill  . Aliskiren-Hydrochlorothiazide (TEKTURNA HCT) 150-12.5 MG TABS Take 1 tablet daily by mouth  30 each  3  . aspirin 500 MG EC tablet Take 500 mg by mouth every 6 (six) hours as needed.        Marland Kitchen atorvastatin (LIPITOR) 10 MG tablet Take 1 tablet (10 mg total) by mouth daily.  30 tablet  5  . Cholecalciferol (VITAMIN D PO) Take by mouth daily. Taking 5000 daily      . COMBIGAN 0.2-0.5 % ophthalmic solution       . diltiazem (TIAZAC) 360 MG 24 hr capsule Take 360 mg by mouth daily.        Marland Kitchen FREESTYLE LITE test strip       . glipiZIDE (GLUCOTROL XL) 5 MG 24 hr tablet Take 1 tablet (5 mg total) by mouth daily.  90 tablet  1  . glucose blood  (FREESTYLE LITE) test strip Use as instructed to check blood sugar 2 times per day dx code 250.02  100 each  5  . hydrochlorothiazide (HYDRODIURIL) 12.5 MG tablet Take 12.5 mg by mouth daily.        . insulin aspart (NOVOLOG) 100 UNIT/ML injection Inject 9-10 Units into the skin 3 (three) times daily before meals. Sliding scale  10 mL  3  . Insulin Detemir (LEVEMIR FLEXTOUCH) 100 UNIT/ML Pen Inject 10 Units into the skin daily at 10 pm.  15 mL  2  . Insulin Pen Needle (B-D UF III MINI PEN NEEDLES) 31G X 5 MM MISC 1 each by Does not apply route 2 (two) times daily.  100 each  11  . latanoprost (XALATAN) 0.005 % ophthalmic solution Place 0.005 % into the left eye as directed.       Marland Kitchen levofloxacin (LEVAQUIN) 500 MG tablet       . Liraglutide 18 MG/3ML SOPN Inject 1.8 mg into the skin daily.  9 pen  1  . megestrol (MEGACE) 20 MG tablet Take 20 mg by mouth 2 (two) times daily.      . metFORMIN (GLUCOPHAGE) 500 MG tablet Take 1 tablet (500 mg total) by mouth 3 (three) times daily.  270 tablet  1  . pioglitazone (ACTOS) 30 MG tablet Take 1 tablet (30 mg total) by mouth daily.  90 tablet  1  . prednisoLONE acetate (PRED FORTE) 1 % ophthalmic suspension Place into both eyes 2 (two) times daily.       . tamsulosin (FLOMAX) 0.4 MG CAPS capsule        No current facility-administered medications for this visit.    No Known Allergies  Patient Active Problem List   Diagnosis Date Noted  . Diabetes 04/21/2014  . Hypotestosteronism 04/21/2014  . Cancer 04/21/2014  . Prostate cancer 11/20/2013  . Elevated PSA 08/29/2013  . Chronic kidney disease, stage II (mild) 08/08/2013  . BPH associated with nocturia 07/24/2013  . Benign hypertensive heart disease without heart failure 07/24/2013  . PAC (premature atrial contraction) 07/24/2012  . Bursitis of left shoulder 07/15/2011  . Hyperlipidemia   . Hypertension   . Type II or unspecified type diabetes mellitus without mention of complication, uncontrolled      History  Smoking status  . Former Smoker -- 0.30 packs/day for 20 years  . Types: Cigarettes  . Quit date: 07/12/1979  Smokeless tobacco  . Never Used    History  Alcohol Use No    No family history on file.  Review of Systems: Constitutional: no fever chills diaphoresis or fatigue or change in weight.  Head and neck: no hearing loss, no epistaxis, no photophobia or visual disturbance. Respiratory: No cough, shortness of breath or wheezing. Cardiovascular: No chest pain peripheral edema, palpitations. Gastrointestinal: No abdominal distention, no abdominal pain, no change in bowel habits hematochezia or melena. Genitourinary: No dysuria, no frequency, no urgency, no nocturia. Musculoskeletal:No arthralgias, no back pain, no gait disturbance or myalgias. Neurological: No dizziness, no headaches, no numbness, no seizures, no syncope, no weakness, no tremors. Hematologic: No lymphadenopathy, no easy bruising. Psychiatric: No confusion, no hallucinations, no sleep disturbance.    Physical Exam: Filed Vitals:   04/21/14 1615  BP: 130/56  Pulse: 115   The general appearance reveals a well-developed well-nourished African American male in no distress.The head and neck exam reveals pupils equal and reactive.  Extraocular movements are full.  There is no scleral icterus.  The mouth and pharynx are normal.  The neck is supple.  The carotids reveal no bruits.  The jugular venous pressure is normal.  The  thyroid is not enlarged.  There is no lymphadenopathy.  The chest is clear to percussion and auscultation.  There are no rales or rhonchi.  Expansion of the chest is symmetrical.  The precordium is quiet.  The first heart sound is normal.  The second heart sound is physiologically split.  There is no murmur gallop rub or click.  There is no abnormal lift or heave.  The abdomen is soft and nontender.  The bowel sounds are normal.  The liver and spleen are not enlarged.  There are no  abdominal masses.  There are no abdominal bruits.  Extremities reveal good pedal pulses.  There is no phlebitis or edema.  There is no cyanosis or clubbing.  Strength is normal and symmetrical in all extremities.  There is no lateralizing weakness.  There are no sensory deficits.  The skin is warm and dry.  There is no rash.   Assessment / Plan: 1. hypertensive heart disease without heart failure 2. Dyslipidemia 3. diabetes mellitus 4. frequent PACs, asymptomatic 5. prostate cancer with elevated PSA, followed by [name]  Continue on same medication.  Return in 4 months for office visit and EKG

## 2014-04-22 ENCOUNTER — Other Ambulatory Visit (HOSPITAL_COMMUNITY): Payer: Self-pay | Admitting: Orthopedic Surgery

## 2014-04-22 DIAGNOSIS — M1612 Unilateral primary osteoarthritis, left hip: Secondary | ICD-10-CM

## 2014-04-30 ENCOUNTER — Encounter (HOSPITAL_COMMUNITY)
Admission: RE | Admit: 2014-04-30 | Discharge: 2014-04-30 | Disposition: A | Discharge status: Home / Self Care | Payer: Medicare Other | Source: Ambulatory Visit | Attending: Orthopedic Surgery | Admitting: Orthopedic Surgery

## 2014-04-30 ENCOUNTER — Ambulatory Visit (HOSPITAL_COMMUNITY)
Admission: RE | Admit: 2014-04-30 | Discharge: 2014-04-30 | Disposition: A | Discharge status: Home / Self Care | Payer: Medicare Other | Source: Ambulatory Visit | Attending: Orthopedic Surgery | Admitting: Orthopedic Surgery

## 2014-04-30 DIAGNOSIS — M898X0 Other specified disorders of bone, multiple sites: Secondary | ICD-10-CM | POA: Insufficient documentation

## 2014-04-30 DIAGNOSIS — M1612 Unilateral primary osteoarthritis, left hip: Secondary | ICD-10-CM

## 2014-04-30 MED ORDER — TECHNETIUM TC 99M MEDRONATE IV KIT
26.0000 | PACK | Freq: Once | INTRAVENOUS | Status: AC | PRN
  Administered 2014-04-30: 26 via INTRAVENOUS

## 2014-06-09 ENCOUNTER — Other Ambulatory Visit: Payer: Self-pay | Admitting: *Deleted

## 2014-06-09 MED ORDER — LIRAGLUTIDE 18 MG/3ML ~~LOC~~ SOPN: 1.8000 mg | PEN_INJECTOR | Freq: Every day | SUBCUTANEOUS | Status: DC

## 2014-06-09 MED ORDER — INSULIN ASPART 100 UNIT/ML FLEXPEN: PEN_INJECTOR | SUBCUTANEOUS | Status: DC

## 2014-06-09 MED ORDER — ALISKIREN-HYDROCHLOROTHIAZIDE 150-12.5 MG PO TABS: ORAL_TABLET | ORAL | Status: DC

## 2014-06-09 MED ORDER — INSULIN DETEMIR 100 UNIT/ML FLEXPEN
10.0000 [IU] | PEN_INJECTOR | Freq: Every day | SUBCUTANEOUS | Status: DC
Start: 2014-06-09 — End: 2014-10-06

## 2014-06-23 ENCOUNTER — Other Ambulatory Visit (INDEPENDENT_AMBULATORY_CARE_PROVIDER_SITE_OTHER): Payer: Medicare Other

## 2014-06-23 DIAGNOSIS — E1165 Type 2 diabetes mellitus with hyperglycemia: Secondary | ICD-10-CM

## 2014-06-23 DIAGNOSIS — IMO0002 Reserved for concepts with insufficient information to code with codable children: Secondary | ICD-10-CM

## 2014-06-23 DIAGNOSIS — E1129 Type 2 diabetes mellitus with other diabetic kidney complication: Secondary | ICD-10-CM

## 2014-06-23 LAB — COMPREHENSIVE METABOLIC PANEL
ALK PHOS: 110 U/L (ref 39–117)
ALT: 13 U/L (ref 0–53)
AST: 12 U/L (ref 0–37)
Albumin: 3.1 g/dL — ABNORMAL LOW (ref 3.5–5.2)
BILIRUBIN TOTAL: 0.3 mg/dL (ref 0.2–1.2)
BUN: 33 mg/dL — ABNORMAL HIGH (ref 6–23)
CO2: 23 meq/L (ref 19–32)
Calcium: 9.1 mg/dL (ref 8.4–10.5)
Chloride: 109 mEq/L (ref 96–112)
Creatinine, Ser: 1.3 mg/dL (ref 0.4–1.5)
GFR: 72.1 mL/min (ref >60.00)
Glucose, Bld: 59 mg/dL — ABNORMAL LOW (ref 70–99)
Potassium: 4.5 mEq/L (ref 3.5–5.1)
SODIUM: 138 meq/L (ref 135–145)
TOTAL PROTEIN: 6.7 g/dL (ref 6.0–8.3)

## 2014-06-23 LAB — HEMOGLOBIN A1C: Hgb A1c MFr Bld: 8.4 % — ABNORMAL HIGH (ref 4.6–6.5)

## 2014-06-26 ENCOUNTER — Encounter: Payer: Self-pay | Admitting: Endocrinology

## 2014-06-26 ENCOUNTER — Telehealth: Payer: Self-pay | Admitting: Endocrinology

## 2014-06-26 ENCOUNTER — Ambulatory Visit (INDEPENDENT_AMBULATORY_CARE_PROVIDER_SITE_OTHER): Payer: Medicare Other | Admitting: Endocrinology

## 2014-06-26 VITALS — BP 100/58 | HR 96 | Temp 98.0°F | Resp 16 | Ht 71.0 in | Wt 223.6 lb

## 2014-06-26 DIAGNOSIS — I952 Hypotension due to drugs: Secondary | ICD-10-CM

## 2014-06-26 DIAGNOSIS — E1165 Type 2 diabetes mellitus with hyperglycemia: Secondary | ICD-10-CM

## 2014-06-26 DIAGNOSIS — N182 Chronic kidney disease, stage 2 (mild): Secondary | ICD-10-CM

## 2014-06-26 DIAGNOSIS — IMO0002 Reserved for concepts with insufficient information to code with codable children: Secondary | ICD-10-CM

## 2014-06-26 NOTE — Telephone Encounter (Signed)
Patient called stating dr. Loanne Drilling wanted the name of his BP medication  Tekturna HCT 150mg     Thank you

## 2014-06-26 NOTE — Progress Notes (Signed)
Patient ID: Jeremy Jennings, male   DOB: [birth year], 78 y.o.   MRN: [id number]   Reason for Appointment: Diabetes follow-up   History of Present Illness    Diagnosis: Type 2 DIABETES MELITUS, date of diagnosis:  1981        PREVIOUS history: He has had long-standing diabetes treated with multiple drugs over the years and still continues to take a multidrug regimen He was started on Victoza in 2010 somewhat better diabetes control and better weight However because of tendency to high postprandial readings and higher A1c was also given mealtime coverage with NovoLog about 2 years ago  Because of his A1c going up to 8.3 in 03/2013 his Victoza was increased from 1.2 up to 1.8 mg  RECENT history:   Because of worsening blood sugar control and A1c of 8.6% he was started on Levemir insulin in 02/2014 Initially was given 8 units and he has gone up to 12 units since his last visit Again his blood sugars have been fluctuating although somewhat less than on his last visit Most of his fluctuation is probably related to variable diet and occasional difficulty complying with timing of his mealtime insulin He is not able to adjust his mealtime doses much based on meal size and carbohydrate intake Has had one episode of hypoglycemia possibly from eating a light lunch and taking the same insulin Also he is checking his blood sugar somewhat erratically at times His glipizide was stopped on the previous visit and blood sugars are not any different with this Currently still taking metformin, currently only 1000 mg a day, renal function has been improved Also his weight is still not coming down His A1c appears to be much higher than expected for his home blood sugar average of 140  Oral hypoglycemic drugs: Actos, metformin bid     Side effects from medications: None Insulin regimen: 10-12 before meals of NovoLog, Levemir 12 hs       Proper timing of medications in relation to meals: Yes.          Monitors  blood glucose:  2.5 times a day.    Glucometer:  FreeStyle         Blood Glucose readings recently from meter download as follows  PRE-MEAL Breakfast Lunch Dinner Bedtime Overall  Glucose range:  68-211   114-192   52-286   133-245    Mean/median:  125     140   Meals: 3 meals per day. Dinner 6 pm          Physical activity: exercise: Some farming and walking,           Dietician visit: Most recent: 1/09           Complications: are: None     Wt Readings from Last 3 Encounters:  06/26/14 223 lb 9.6 oz (101.424 kg)  04/21/14 222 lb (100.699 kg)  03/27/14 221 lb 9.6 oz (100.517 kg)   LABS:   Lab Results  Component Value Date   HGBA1C 8.4* 06/23/2014   HGBA1C 8.2* 03/24/2014   HGBA1C 8.6* 01/16/2014   Lab Results  Component Value Date   MICROALBUR 2.8* 03/12/2013   LDLCALC 107* 01/16/2014   CREATININE 1.3 06/23/2014    Appointment on 06/23/2014  Component Date Value Ref Range Status  . Hgb A1c MFr Bld 06/23/2014 8.4* 4.6 - 6.5 % Final   Glycemic Control Guidelines for People with Diabetes:Non Diabetic:  <6%Goal of Therapy: <7%Additional Action Suggested:  >8%   .  Sodium 06/23/2014 138  135 - 145 mEq/L Final  . Potassium 06/23/2014 4.5  3.5 - 5.1 mEq/L Final  . Chloride 06/23/2014 109  96 - 112 mEq/L Final  . CO2 06/23/2014 23  19 - 32 mEq/L Final  . Glucose, Bld 06/23/2014 59* 70 - 99 mg/dL Final  . BUN 06/23/2014 33* 6 - 23 mg/dL Final  . Creatinine, Ser 06/23/2014 1.3  0.4 - 1.5 mg/dL Final  . Total Bilirubin 06/23/2014 0.3  0.2 - 1.2 mg/dL Final  . Alkaline Phosphatase 06/23/2014 110  39 - 117 U/L Final  . AST 06/23/2014 12  0 - 37 U/L Final  . ALT 06/23/2014 13  0 - 53 U/L Final  . Total Protein 06/23/2014 6.7  6.0 - 8.3 g/dL Final  . Albumin 06/23/2014 3.1* 3.5 - 5.2 g/dL Final  . Calcium 06/23/2014 9.1  8.4 - 10.5 mg/dL Final  . GFR 06/23/2014 72.10  >60.00 mL/min Final      Medication List       This list is accurate as of: 06/26/14 11:59 PM.  Always use  your most recent med list.               Aliskiren-Hydrochlorothiazide 150-12.5 MG Tabs  Commonly known as:  TEKTURNA HCT  Take 1 tablet daily by mouth     aspirin 500 MG EC tablet  Take 500 mg by mouth every 6 (six) hours as needed.     atorvastatin 10 MG tablet  Commonly known as:  LIPITOR  Take 1 tablet (10 mg total) by mouth daily.     COMBIGAN 0.2-0.5 % ophthalmic solution  Generic drug:  brimonidine-timolol     diltiazem 360 MG 24 hr capsule  Commonly known as:  TIAZAC  Take 360 mg by mouth daily.     FREESTYLE LITE test strip  Generic drug:  glucose blood     glucose blood test strip  Commonly known as:  FREESTYLE LITE  Use as instructed to check blood sugar 2 times per day dx code 250.02     glipiZIDE 5 MG 24 hr tablet  Commonly known as:  GLUCOTROL XL  Take 1 tablet (5 mg total) by mouth daily.     hydrochlorothiazide 12.5 MG tablet  Commonly known as:  HYDRODIURIL  Take 12.5 mg by mouth daily.     insulin aspart 100 UNIT/ML FlexPen  Commonly known as:  NOVOLOG FLEXPEN  Inject 9-10 units 3 times per day     Insulin Detemir 100 UNIT/ML Pen  Commonly known as:  LEVEMIR FLEXTOUCH  Inject 10 Units into the skin daily at 10 pm.     Insulin Pen Needle 31G X 5 MM Misc  Commonly known as:  B-D UF III MINI PEN NEEDLES  1 each by Does not apply route 2 (two) times daily.     latanoprost 0.005 % ophthalmic solution  Commonly known as:  XALATAN  Place 0.005 % into the left eye as directed.     levofloxacin 500 MG tablet  Commonly known as:  LEVAQUIN     Liraglutide 18 MG/3ML Sopn  Inject 1.8 mg into the skin daily.     megestrol 20 MG tablet  Commonly known as:  MEGACE  Take 20 mg by mouth 2 (two) times daily.     meloxicam 7.5 MG tablet  Commonly known as:  MOBIC     metFORMIN 500 MG tablet  Commonly known as:  GLUCOPHAGE  Take 1 tablet (500 mg total)  by mouth 3 (three) times daily.     pioglitazone 30 MG tablet  Commonly known as:  ACTOS  Take  1 tablet (30 mg total) by mouth daily.     prednisoLONE acetate 1 % ophthalmic suspension  Commonly known as:  PRED FORTE  Place into both eyes 2 (two) times daily.     tamsulosin 0.4 MG Caps capsule  Commonly known as:  FLOMAX     VITAMIN D PO  Take by mouth daily. Taking 5000 daily        Allergies: No Known Allergies  Past Medical History  Diagnosis Date  . Hypertension   . Diabetes mellitus   . Heart murmur     SYSTOLIC MURMUR AT BASE  . Hyperlipidemia   . Prostate cancer     Past Surgical History  Procedure Laterality Date  . Tonsillectomy      IN [facility]  . Hemorroidectomy  1969  . Hernia repair  AFE [age]  . Hernia repair  1981    DR WEATHERLY    No family history on file.  Social History:  reports that he quit smoking about 34 years ago. His smoking use included Cigarettes. He has a 6 pack-year smoking history. He has never used smokeless tobacco. He reports that he does not drink alcohol or use illicit drugs.  Review of Systems:  He is asking about left inner thigh pain which is present mostly late in the evenings and sometimes on waking up He has taken some over-the-counter arthritis tablet, not sure where this contains with some relief. Has not followed up with PCP  HYPERTENSION:  well controlled usually, followed by cardiologist.  No changes made in his medications apparently on review of his chart.  He does not feel lightheaded despite a low blood pressure today.  Not checking blood pressure readings at home  HYPERLIPIDEMIA: The lipid abnormality consists of elevated LDL, on Lipitor for several years but because of high liver functions he has been taken off the 40 mg dose and changed to 10 mg by cardiologist, LDL is above 100 as of the last visit Liver functions have been fairly good  Lab Results  Component Value Date   CHOL 170 01/16/2014   HDL 44.60 01/16/2014   LDLCALC 107* 01/16/2014   TRIG 90.0 01/16/2014   CHOLHDL 4 01/16/2014     Chronic  renal insufficiency: His creatinine is stable  Lab Results  Component Value Date   CREATININE 1.3 06/23/2014   He is taking Aleve 2 tablets at night daily for joint pain. He is going to see his orthopedic surgeon for knee pain     Examination:   BP 100/58 mmHg  Pulse 96  Temp(Src) 98 F (36.7 C)  Resp 16  Ht 5\' 11"  (1.803 m)  Wt 223 lb 9.6 oz (101.424 kg)  BMI 31.20 kg/m2  SpO2 99%  Body mass index is 31.2 kg/(m^2).   Blood pressure checked twice, second reading was standing   no ankle edema  ASSESSMENT/ PLAN:   Diabetes type 2 with mild obesity. His A1c is still over 8% although he has only a few high readings and blood sugars are averaging much lower than expected for his A1c Mostly has tendency to sporadic postprandial hyperglycemia from variability in his diet especially when traveling He is taking relatively small doses of basal insulin with good control of fasting blood sugars Have discussed again the timing of mealtime insulin and adjusting dose based on his diet especially carbohydrate  intake at breakfast which is variable For now he will adjust his NovoLog between 10-14 units based on meal size and carbohydrate intake Discussed that he will need to check more readings after supper to help judge his insulin requirement in the evening  Continue metformin at this time Encouraged him to stay consistently active  Hypertension: Followed by PCP, blood pressure is relatively low especially standing and may stop his HCTZ.  However he is somewhat confused about what medications he is taking  Mild CKD: Creatinine will need to be checked periodically.  Advised him to stop taking Aleve and ask for alternative pain medications   Patient Instructions  Take 14 Novolog with oatmeal and 10 for egg and toast  Stop HCTZ   Counseling time over 50% of today's 25 minute visit   Ivorie Uplinger 06/27/2014, 10:30 AM

## 2014-06-26 NOTE — Patient Instructions (Addendum)
Take 14 Novolog with oatmeal and 10 for egg and toast  Stop HCTZ

## 2014-07-01 NOTE — Telephone Encounter (Signed)
Medication added to list.

## 2014-07-09 ENCOUNTER — Other Ambulatory Visit: Payer: Self-pay | Admitting: Cardiology

## 2014-07-10 ENCOUNTER — Other Ambulatory Visit: Payer: Self-pay | Admitting: *Deleted

## 2014-07-10 MED ORDER — PIOGLITAZONE HCL 30 MG PO TABS: 30.0000 mg | ORAL_TABLET | Freq: Every day | ORAL | Status: DC

## 2014-07-24 ENCOUNTER — Ambulatory Visit (INDEPENDENT_AMBULATORY_CARE_PROVIDER_SITE_OTHER): Payer: Medicare Other | Admitting: Endocrinology

## 2014-07-24 ENCOUNTER — Encounter: Payer: Self-pay | Admitting: Endocrinology

## 2014-07-24 VITALS — BP 110/52 | HR 111 | Temp 98.0°F | Resp 16 | Ht 71.0 in | Wt 227.2 lb

## 2014-07-24 DIAGNOSIS — I952 Hypotension due to drugs: Secondary | ICD-10-CM

## 2014-07-24 DIAGNOSIS — R000 Tachycardia, unspecified: Secondary | ICD-10-CM

## 2014-07-24 DIAGNOSIS — IMO0002 Reserved for concepts with insufficient information to code with codable children: Secondary | ICD-10-CM

## 2014-07-24 DIAGNOSIS — I471 Supraventricular tachycardia: Secondary | ICD-10-CM

## 2014-07-24 DIAGNOSIS — E1165 Type 2 diabetes mellitus with hyperglycemia: Secondary | ICD-10-CM

## 2014-07-24 LAB — RENAL FUNCTION PANEL
Albumin: 3.6 g/dL (ref 3.5–5.2)
BUN: 34 mg/dL — ABNORMAL HIGH (ref 6–23)
CALCIUM: 9.9 mg/dL (ref 8.4–10.5)
CHLORIDE: 103 meq/L (ref 96–112)
CO2: 25 mEq/L (ref 19–32)
Creatinine, Ser: 1.36 mg/dL (ref 0.40–1.50)
GFR: 65.4 mL/min (ref >60.00)
Glucose, Bld: 122 mg/dL — ABNORMAL HIGH (ref 70–99)
PHOSPHORUS: 3.6 mg/dL (ref 2.3–4.6)
POTASSIUM: 4.5 meq/L (ref 3.5–5.1)
Sodium: 135 mEq/L (ref 135–145)

## 2014-07-24 LAB — TSH: TSH: 3.22 u[IU]/mL (ref 0.35–4.50)

## 2014-07-24 LAB — T4, FREE: Free T4: 0.92 ng/dL (ref 0.60–1.60)

## 2014-07-24 MED ORDER — CARVEDILOL 6.25 MG PO TABS: 6.2500 mg | ORAL_TABLET | Freq: Two times a day (BID) | ORAL | Status: DC

## 2014-07-24 NOTE — Progress Notes (Signed)
Patient ID: Jeremy Jennings, male   DOB: 11-Aug-1935, 79 y.o.   MRN: [id number]   Reason for Appointment:  follow-up   History of Present Illness    Problem 1:  HYPERTENSION:  well controlled usually, followed by cardiologist.  His blood pressure has been relatively low in the last few months On his last visit he was not sure of what medications he was taking Apparently he is taking Tekturna HCT along with his diltiazem Also appears to have persistent heart rate although he does not complain of palpitations He is asking about some tendency to sweating at night and feeling tired   He does not feel lightheaded despite a relatively low blood pressure.  Not checking blood pressure readings at home   Problem 2: Type 2 DIABETES MELITUS, date of diagnosis:  1981        PREVIOUS history: He has had long-standing diabetes treated with multiple drugs over the years and still continues to take a multidrug regimen He was started on Victoza in 2010 somewhat better diabetes control and better weight However because of tendency to high postprandial readings and higher A1c was also given mealtime coverage with NovoLog about 2 years ago  Because of his A1c going up to 8.3 in 03/2013 his Victoza was increased from 1.2 up to 1.8 mg Because of worsening blood sugar control and A1c of 8.6% he was started on Levemir insulin in 02/2014  RECENT history: He did not bring his blood sugar monitor for download today and not clear what his blood sugar patterns are He claims that his blood sugars are fairly well controlled without as much fluctuation and rarely over 200 He is still continuing on a basal bolus insulin regimen On his last visit he was asked to adjust his mealtime dose based on meal content and carbohydrates and he thinks he is doing this No recent hypoglycemia He thinks his fasting blood sugars are usually under 130 and only rarely up to 175 Does not remember nonfasting readings but they are usually not  over 175 either His last A1c was still high at 8.4%  Oral hypoglycemic drugs: Actos, metformin bid     Side effects from medications: None Insulin regimen: 10-12 before meals of NovoLog, Levemir 12 hs       Proper timing of medications in relation to meals: Yes.          Monitors blood glucose:  2.5 times a day.    Glucometer:  FreeStyle         Blood Glucose readings recently as above  Meals: 3 meals per day. Dinner 6 pm          Physical activity: exercise: Some farming and walking,           Dietician visit: Most recent: 4/31           Complications: are: None     Wt Readings from Last 3 Encounters:  07/24/14 227 lb 3.2 oz (103.057 kg)  06/26/14 223 lb 9.6 oz (101.424 kg)  04/21/14 222 lb (100.699 kg)   LABS:   Lab Results  Component Value Date   HGBA1C 8.4* 06/23/2014   HGBA1C 8.2* 03/24/2014   HGBA1C 8.6* 01/16/2014   Lab Results  Component Value Date   MICROALBUR 2.8* 03/12/2013   LDLCALC 107* 01/16/2014   CREATININE 1.36 07/24/2014    Office Visit on 07/24/2014  Component Date Value Ref Range Status  . TSH 07/24/2014 3.22  0.35 - 4.50 uIU/mL Final  .  Free T4 07/24/2014 0.92  0.60 - 1.60 ng/dL Final  . Sodium 07/24/2014 135  135 - 145 mEq/L Final  . Potassium 07/24/2014 4.5  3.5 - 5.1 mEq/L Final  . Chloride 07/24/2014 103  96 - 112 mEq/L Final  . CO2 07/24/2014 25  19 - 32 mEq/L Final  . Calcium 07/24/2014 9.9  8.4 - 10.5 mg/dL Final  . Albumin 07/24/2014 3.6  3.5 - 5.2 g/dL Final  . BUN 07/24/2014 34* 6 - 23 mg/dL Final  . Creatinine, Ser 07/24/2014 1.36  0.40 - 1.50 mg/dL Final  . Glucose, Bld 07/24/2014 122* 70 - 99 mg/dL Final  . Phosphorus 07/24/2014 3.6  2.3 - 4.6 mg/dL Final  . GFR 07/24/2014 65.40  >60.00 mL/min Final      Medication List       This list is accurate as of: 07/24/14  3:36 PM.  Always use your most recent med list.               aspirin 500 MG EC tablet  Take 500 mg by mouth every 6 (six) hours as needed.     atorvastatin  10 MG tablet  Commonly known as:  LIPITOR  take 1 tablet by mouth once daily     carvedilol 6.25 MG tablet  Commonly known as:  COREG  Take 1 tablet (6.25 mg total) by mouth 2 (two) times daily with a meal.     COMBIGAN 0.2-0.5 % ophthalmic solution  Generic drug:  brimonidine-timolol     diltiazem 360 MG 24 hr capsule  Commonly known as:  TIAZAC  Take 360 mg by mouth daily.     FREESTYLE LITE test strip  Generic drug:  glucose blood     glucose blood test strip  Commonly known as:  FREESTYLE LITE  Use as instructed to check blood sugar 2 times per day dx code 250.02     glipiZIDE 5 MG 24 hr tablet  Commonly known as:  GLUCOTROL XL  Take 1 tablet (5 mg total) by mouth daily.     insulin aspart 100 UNIT/ML FlexPen  Commonly known as:  NOVOLOG FLEXPEN  Inject 9-10 units 3 times per day     Insulin Detemir 100 UNIT/ML Pen  Commonly known as:  LEVEMIR FLEXTOUCH  Inject 10 Units into the skin daily at 10 pm.     Insulin Pen Needle 31G X 5 MM Misc  Commonly known as:  B-D UF III MINI PEN NEEDLES  1 each by Does not apply route 2 (two) times daily.     latanoprost 0.005 % ophthalmic solution  Commonly known as:  XALATAN  Place 0.005 % into the left eye as directed.     levofloxacin 500 MG tablet  Commonly known as:  LEVAQUIN     Liraglutide 18 MG/3ML Sopn  Inject 1.8 mg into the skin daily.     megestrol 20 MG tablet  Commonly known as:  MEGACE  Take 20 mg by mouth 2 (two) times daily.     metFORMIN 500 MG tablet  Commonly known as:  GLUCOPHAGE  Take 1 tablet (500 mg total) by mouth 3 (three) times daily.     pioglitazone 30 MG tablet  Commonly known as:  ACTOS  Take 1 tablet (30 mg total) by mouth daily.     prednisoLONE acetate 1 % ophthalmic suspension  Commonly known as:  PRED FORTE  Place into both eyes 2 (two) times daily.     tamsulosin 0.4  MG Caps capsule  Commonly known as:  FLOMAX     VITAMIN D PO  Take by mouth daily. Taking 5000 daily         Allergies: No Known Allergies  Past Medical History  Diagnosis Date  . Hypertension   . Diabetes mellitus   . Heart murmur     SYSTOLIC MURMUR AT BASE  . Hyperlipidemia   . Prostate cancer     Past Surgical History  Procedure Laterality Date  . Tonsillectomy      IN [facility]  . Hemorroidectomy  1969  . Hernia repair  AFE [age]  . Hernia repair  1981    DR WEATHERLY    No family history on file.  Social History:  reports that he quit smoking about 35 years ago. His smoking use included Cigarettes. He has a 6 pack-year smoking history. He has never used smokeless tobacco. He reports that he does not drink alcohol or use illicit drugs.  Review of Systems:  He is still knee and thigh pain which is present mostly late in the evenings and sometimes on waking up He has taken Aleve 2 tablets at night with some relief despite asking him to avoid it because of renal dysfunction. Has not followed up with orthopedic surgeon recently  HYPERLIPIDEMIA: The lipid abnormality consists of elevated LDL, on Lipitor for several years but because of high liver functions he has been taken off the 40 mg dose and changed to 10 mg by cardiologist, LDL is above 100 as of 7/15 Liver functions have been fairly good  Lab Results  Component Value Date   CHOL 170 01/16/2014   HDL 44.60 01/16/2014   LDLCALC 107* 01/16/2014   TRIG 90.0 01/16/2014   CHOLHDL 4 01/16/2014     Chronic renal insufficiency: His creatinine is stable  Lab Results  Component Value Date   CREATININE 1.36 07/24/2014       Examination:   BP 110/52 mmHg  Pulse 111  Temp(Src) 98 F (36.7 C)  Resp 16  Ht 5\' 11"  (1.803 m)  Wt 227 lb 3.2 oz (103.057 kg)  BMI 31.70 kg/m2  SpO2 98%  Body mass index is 31.7 kg/(m^2).     no ankle edema  ASSESSMENT/ PLAN:   Hypertension: His blood pressure is persistently low and although he is asymptomatic He is taking multiple drugs at this time Since he is having unexplained sinus  tachycardia will need to change his medications at this time He will try taking Coreg 6.25 mg twice a day instead of Tekturna HCT He Will Follow-Up with His cardiologist next month Will also need to check his renal function today   Diabetes type 2 with mild obesity. His A1c has been over 8% although he has  reportedly fairly good blood sugars recently at home Did not bring his monitor and will continue the same regimen for now He needs to be consistent with his diet with balanced low fat meals  Encouraged him to stay consistently active  Mild CKD: Creatinine will need to be checked .  Advised him to reduce  Aleve and ask for alternative pain medications from his PCP or orthopedic surgeon   There are no Patient Instructions on file for this visit. Counseling time over 50% of today's 25 minute visit   Tymar Polyak 07/24/2014, 3:36 PM

## 2014-07-24 NOTE — Progress Notes (Signed)
Quick Note:  Please let patient know that the thyroid result is normal and since kidney test is a little higher would like to avoid Aleve and ask orthopedic doctor for other pain medication ______

## 2014-07-25 ENCOUNTER — Encounter: Payer: Self-pay | Admitting: *Deleted

## 2014-08-19 ENCOUNTER — Encounter: Payer: Self-pay | Admitting: Cardiology

## 2014-08-19 ENCOUNTER — Ambulatory Visit (INDEPENDENT_AMBULATORY_CARE_PROVIDER_SITE_OTHER): Payer: Medicare Other | Admitting: Cardiology

## 2014-08-19 VITALS — BP 132/66 | HR 104 | Ht 71.0 in | Wt 223.4 lb

## 2014-08-19 DIAGNOSIS — I10 Essential (primary) hypertension: Secondary | ICD-10-CM

## 2014-08-19 DIAGNOSIS — E785 Hyperlipidemia, unspecified: Secondary | ICD-10-CM

## 2014-08-19 DIAGNOSIS — I119 Hypertensive heart disease without heart failure: Secondary | ICD-10-CM

## 2014-08-19 DIAGNOSIS — I491 Atrial premature depolarization: Secondary | ICD-10-CM

## 2014-08-19 LAB — LIPID PANEL
CHOLESTEROL: 120 mg/dL (ref 0–200)
HDL: 40.9 mg/dL (ref >39.00)
LDL Cholesterol: 62 mg/dL (ref 0–99)
NONHDL: 79.1
TRIGLYCERIDES: 87 mg/dL (ref 0.0–149.0)
Total CHOL/HDL Ratio: 3
VLDL: 17.4 mg/dL (ref 0.0–40.0)

## 2014-08-19 MED ORDER — CARVEDILOL 12.5 MG PO TABS: 12.5000 mg | ORAL_TABLET | Freq: Two times a day (BID) | ORAL | Status: DC

## 2014-08-19 NOTE — Patient Instructions (Signed)
Will obtain labs today and call you with the results (LP)  INCREASE YOUR CARVEDILOL TO 12.5 MG TWICE A DAY  Your physician recommends that you schedule a follow-up appointment in: [place]

## 2014-08-19 NOTE — Progress Notes (Signed)
Cardiology Office Note   Date:  08/19/2014   ID:  Jeremy Jennings, DOB 20-Apr-1936, MRN [id number]  PCP:  Jeremy Coco, MD  Cardiologist:   Jeremy Coco, MD   No chief complaint on file.     History of Present Illness: Jeremy Jennings is a 79 y.o. male who presents for a four-month follow-up office visit  This pleasant 79 year old African American gentleman is seen for a scheduled four-month followup office visit. He has a past history of hypertension and diabetes. He has a history of prostate cancer and is being followed by Dr. Festus Aloe. He had a normal treadmill Cardiolite stress test in 2005. He has had a history of mild cardiomegaly and an echocardiogram in 2007 showed mild LVH with normal systolic function and with diastolic dysfunction. The patient gets some exercise. He enjoys gardening. He has had some pain in his left upper thigh and Dr. Gladstone Lighter, his orthopedist, has ordered a radioisotope bone scan. He has not been aware of any palpitations. In April 2014 he had cataract surgery on his right eye. He has had a reasonable good result but is still having some speckles in front of his eye and is now also seeing a retinal specialist. There is been no evidence of any diabetic retinopathy. Since last visit he has had no new cardiac symptoms. Denies any chest pain or shortness of breath. The patient has a history of prostate cancer.  He is seeing [name] sound.  He has a check of his PSA tomorrow.  He has been on chemotherapy. He has been having pain in the left hip intermittently.  It is worse in the morning and seems to limber up as the day goes on.  He has been seeing a Restaurant manager, fast food. His pulse and blood pressure have been mildly elevated.  Past Medical History  Diagnosis Date  . Hypertension   . Diabetes mellitus   . Heart murmur     SYSTOLIC MURMUR AT BASE  . Hyperlipidemia   . Prostate cancer     Past Surgical History  Procedure Laterality Date  .  Tonsillectomy      IN [facility]  . Hemorroidectomy  1969  . Hernia repair  AFE 18-17  . Hernia repair  1981    DR WEATHERLY     Current Outpatient Prescriptions  Medication Sig Dispense Refill  . atorvastatin (LIPITOR) 10 MG tablet take 1 tablet by mouth once daily 30 tablet 5  . carvedilol (COREG) 12.5 MG tablet Take 1 tablet (12.5 mg total) by mouth 2 (two) times daily with a meal. 60 tablet 5  . Cholecalciferol (VITAMIN D PO) Take by mouth daily. Taking 5000 daily    . COMBIGAN 0.2-0.5 % ophthalmic solution     . diltiazem (TIAZAC) 360 MG 24 hr capsule Take 360 mg by mouth daily.      Marland Kitchen FREESTYLE LITE test strip     . glipiZIDE (GLUCOTROL XL) 5 MG 24 hr tablet Take 1 tablet (5 mg total) by mouth daily. 90 tablet 1  . glucose blood (FREESTYLE LITE) test strip Use as instructed to check blood sugar 2 times per day dx code 250.02 100 each 5  . insulin aspart (NOVOLOG FLEXPEN) 100 UNIT/ML FlexPen Inject 9-10 units 3 times per day 15 mL 3  . Insulin Detemir (LEVEMIR FLEXTOUCH) 100 UNIT/ML Pen Inject 10 Units into the skin daily at 10 pm. 15 mL 2  . Insulin Pen Needle (B-D UF III MINI PEN NEEDLES) 31G  X 5 MM MISC 1 each by Does not apply route 2 (two) times daily. 100 each 11  . latanoprost (XALATAN) 0.005 % ophthalmic solution Place 0.005 % into the left eye as directed.     Marland Kitchen levofloxacin (LEVAQUIN) 500 MG tablet     . Liraglutide 18 MG/3ML SOPN Inject 1.8 mg into the skin daily. 9 pen 1  . megestrol (MEGACE) 20 MG tablet Take 20 mg by mouth 2 (two) times daily.    . metFORMIN (GLUCOPHAGE) 500 MG tablet Take 1 tablet (500 mg total) by mouth 3 (three) times daily. 270 tablet 1  . pioglitazone (ACTOS) 30 MG tablet Take 1 tablet (30 mg total) by mouth daily. 90 tablet 1  . prednisoLONE acetate (PRED FORTE) 1 % ophthalmic suspension Place into both eyes 2 (two) times daily.     . tamsulosin (FLOMAX) 0.4 MG CAPS capsule     . TEKTURNA HCT 150-12.5 MG TABS Take 12.5 mg by mouth daily.   0   No  current facility-administered medications for this visit.    Allergies:   Review of patient's allergies indicates no known allergies.    Social History:  The patient  reports that he quit smoking about 35 years ago. His smoking use included Cigarettes. He has a 6 pack-year smoking history. He has never used smokeless tobacco. He reports that he does not drink alcohol or use illicit drugs.   Family History:  The patient's family history is not on file.    ROS:  Please see the history of present illness.   Otherwise, review of systems are positive for none.   All other systems are reviewed and negative.    PHYSICAL EXAM: VS:  BP 132/66 mmHg  Pulse 104  Ht 5\' 11"  (1.803 m)  Wt 223 lb 6.4 oz (101.334 kg)  BMI 31.17 kg/m2 , BMI Body mass index is 31.17 kg/(m^2). GEN: Well nourished, well developed, in no acute distress HEENT: normal Neck: no JVD, carotid bruits, or masses Cardiac: RRR; no murmurs, rubs, or gallops,no edema  Respiratory:  clear to auscultation bilaterally, normal work of breathing GI: soft, nontender, nondistended, + BS MS: no deformity or atrophy Skin: warm and dry, no rash Neuro:  Strength and sensation are intact Psych: euthymic mood, full affect   EKG:  EKG is ordered today. The ekg ordered today demonstrates sinus tachycardia at 104/m with occasional premature atrial beats.  Since the prior tracing of 07/24/13, no significant change   Recent Labs: 06/23/2014: ALT 13 07/24/2014: BUN 34*; Creatinine 1.36; Potassium 4.5; Sodium 135; TSH 3.22    Lipid Panel    Component Value Date/Time   CHOL 120 08/19/2014 1046   TRIG 87.0 08/19/2014 1046   HDL 40.90 08/19/2014 1046   CHOLHDL 3 08/19/2014 1046   VLDL 17.4 08/19/2014 1046   LDLCALC 62 08/19/2014 1046      Wt Readings from Last 3 Encounters:  08/19/14 223 lb 6.4 oz (101.334 kg)  07/24/14 227 lb 3.2 oz (103.057 kg)  06/26/14 223 lb 9.6 oz (101.424 kg)         ASSESSMENT AND PLAN:  1.  hypertensive heart disease without heart failure 2. Dyslipidemia 3. diabetes mellitus 4. frequent PACs, asymptomatic 5. prostate cancer with elevated PSA, followed by [name] 6.  Sinus tachycardia   Current medicines are reviewed at length with the patient today.  The patient does not have concerns regarding medicines.  The following changes have been made:  Increase carvedilol to 12.5  mg twice a day    Orders Placed This Encounter  Procedures  . EKG 12-Lead     Disposition:   FU with Dr. Mare Ferrari in 4 months or office visit and EKG   Signed, Jeremy Coco, MD  08/19/2014 7:00 PM    [address], [place], [place]  [address] Phone: [phone]; Fax: [phone]

## 2014-08-28 LAB — HM DIABETES EYE EXAM

## 2014-08-29 ENCOUNTER — Encounter: Payer: Self-pay | Admitting: *Deleted

## 2014-09-22 ENCOUNTER — Ambulatory Visit: Payer: Medicare Other | Admitting: Endocrinology

## 2014-09-22 ENCOUNTER — Other Ambulatory Visit (INDEPENDENT_AMBULATORY_CARE_PROVIDER_SITE_OTHER): Payer: Medicare Other

## 2014-09-22 DIAGNOSIS — E1165 Type 2 diabetes mellitus with hyperglycemia: Secondary | ICD-10-CM

## 2014-09-22 DIAGNOSIS — IMO0002 Reserved for concepts with insufficient information to code with codable children: Secondary | ICD-10-CM

## 2014-09-22 LAB — COMPREHENSIVE METABOLIC PANEL
ALT: 11 U/L (ref 0–53)
AST: 11 U/L (ref 0–37)
Albumin: 3.3 g/dL — ABNORMAL LOW (ref 3.5–5.2)
Alkaline Phosphatase: 182 U/L — ABNORMAL HIGH (ref 39–117)
BILIRUBIN TOTAL: 0.2 mg/dL (ref 0.2–1.2)
BUN: 29 mg/dL — ABNORMAL HIGH (ref 6–23)
CO2: 27 mEq/L (ref 19–32)
CREATININE: 1.19 mg/dL (ref 0.40–1.50)
Calcium: 9.9 mg/dL (ref 8.4–10.5)
Chloride: 103 mEq/L (ref 96–112)
GFR: 76.26 mL/min (ref >60.00)
GLUCOSE: 178 mg/dL — AB (ref 70–99)
POTASSIUM: 4.2 meq/L (ref 3.5–5.1)
Sodium: 136 mEq/L (ref 135–145)
Total Protein: 6.8 g/dL (ref 6.0–8.3)

## 2014-09-22 LAB — HEMOGLOBIN A1C: HEMOGLOBIN A1C: 8.3 % — AB (ref 4.6–6.5)

## 2014-09-25 ENCOUNTER — Ambulatory Visit (INDEPENDENT_AMBULATORY_CARE_PROVIDER_SITE_OTHER): Payer: Medicare Other | Admitting: Endocrinology

## 2014-09-25 ENCOUNTER — Encounter: Payer: Self-pay | Admitting: Endocrinology

## 2014-09-25 VITALS — BP 126/60 | HR 106 | Temp 98.0°F | Ht 71.0 in | Wt 223.0 lb

## 2014-09-25 DIAGNOSIS — IMO0002 Reserved for concepts with insufficient information to code with codable children: Secondary | ICD-10-CM

## 2014-09-25 DIAGNOSIS — R000 Tachycardia, unspecified: Secondary | ICD-10-CM

## 2014-09-25 DIAGNOSIS — I10 Essential (primary) hypertension: Secondary | ICD-10-CM

## 2014-09-25 DIAGNOSIS — I471 Supraventricular tachycardia: Secondary | ICD-10-CM

## 2014-09-25 DIAGNOSIS — E1165 Type 2 diabetes mellitus with hyperglycemia: Secondary | ICD-10-CM

## 2014-09-25 NOTE — Patient Instructions (Signed)
Check BP and pulse  Increase Novolog 2 units in am and at supper  Please check blood sugars at least half the time about 2 hours after any meal and 3 times per week on waking up. Please bring blood sugar monitor to each visit. Recommended blood sugar levels about 2 hours after meal is 140-180 and on waking up 90-130

## 2014-09-25 NOTE — Progress Notes (Signed)
Patient ID: Jeremy Jennings, male   DOB: August 31, 1935, 79 y.o.   MRN: [id number]   Reason for Appointment:  follow-up   History of Present Illness    Type 2 DIABETES MELITUS, date of diagnosis:  1981        PREVIOUS history: He has had long-standing diabetes treated with multiple drugs over the years and still continues to take a multidrug regimen He was started on Victoza in 2010 somewhat better diabetes control and better weight However because of tendency to high postprandial readings and higher A1c was also given mealtime coverage with NovoLog about 2 years ago  Because of his A1c going up to 8.3 in 03/2013 his Victoza was increased from 1.2 up to 1.8 mg Because of worsening blood sugar control and A1c of 8.6% he was started on Levemir insulin in 02/2014  RECENT history: He did not bring his blood sugar monitor for download today and not clear what his blood sugar patterns are He claims that his blood sugars are fairly well controlled without as much fluctuation and rarely over 200 He is still continuing on a basal bolus insulin regimen On his last visit he was asked to adjust his mealtime dose based on meal content and carbohydrates and he thinks he is doing this No recent hypoglycemia He thinks his fasting blood sugars are usually under 130 and only rarely up to 175 Does not remember nonfasting readings but they are usually not over 175 either His last A1c was still high at 8.4%  Oral hypoglycemic drugs: Actos, metformin bid     Side effects from medications: None Insulin regimen: 10-12 before acb acl; of NovoLog, 14 acs Levemir 12 hs       Proper timing of medications in relation to meals: Yes.          Monitors blood glucose:  2.5 times a day.    Glucometer:  FreeStyle         Blood Glucose readings recall:  FBS: 110-120; Pcs 170s; inconsistent acs  Meals: 3 meals per day. Dinner 6 pm          Physical activity: exercise: Some farming; walking at mall           Dietician visit:  Most recent: 5/63           Complications: are: None     Wt Readings from Last 3 Encounters:  09/25/14 223 lb (101.152 kg)  08/19/14 223 lb 6.4 oz (101.334 kg)  07/24/14 227 lb 3.2 oz (103.057 kg)   LABS:   Lab Results  Component Value Date   HGBA1C 8.3* 09/22/2014   HGBA1C 8.4* 06/23/2014   HGBA1C 8.2* 03/24/2014   Lab Results  Component Value Date   MICROALBUR 2.8* 03/12/2013   LDLCALC 62 08/19/2014   CREATININE 1.19 09/22/2014    Lab on 09/22/2014  Component Date Value Ref Range Status  . Hgb A1c MFr Bld 09/22/2014 8.3* 4.6 - 6.5 % Final   Glycemic Control Guidelines for People with Diabetes:Non Diabetic:  <6%Goal of Therapy: <7%Additional Action Suggested:  >8%   . Sodium 09/22/2014 136  135 - 145 mEq/L Final  . Potassium 09/22/2014 4.2  3.5 - 5.1 mEq/L Final  . Chloride 09/22/2014 103  96 - 112 mEq/L Final  . CO2 09/22/2014 27  19 - 32 mEq/L Final  . Glucose, Bld 09/22/2014 178* 70 - 99 mg/dL Final  . BUN 09/22/2014 29* 6 - 23 mg/dL Final  . Creatinine, Ser 09/22/2014 1.19  0.40 - 1.50 mg/dL Final  . Total Bilirubin 09/22/2014 0.2  0.2 - 1.2 mg/dL Final  . Alkaline Phosphatase 09/22/2014 182* 39 - 117 U/L Final  . AST 09/22/2014 11  0 - 37 U/L Final  . ALT 09/22/2014 11  0 - 53 U/L Final  . Total Protein 09/22/2014 6.8  6.0 - 8.3 g/dL Final  . Albumin 09/22/2014 3.3* 3.5 - 5.2 g/dL Final  . Calcium 09/22/2014 9.9  8.4 - 10.5 mg/dL Final  . GFR 09/22/2014 76.26  >60.00 mL/min Final      Medication List       This list is accurate as of: 09/25/14  3:28 PM.  Always use your most recent med list.               atorvastatin 10 MG tablet  Commonly known as:  LIPITOR  take 1 tablet by mouth once daily     carvedilol 12.5 MG tablet  Commonly known as:  COREG  Take 1 tablet (12.5 mg total) by mouth 2 (two) times daily with a meal.     COMBIGAN 0.2-0.5 % ophthalmic solution  Generic drug:  brimonidine-timolol     diltiazem 360 MG 24 hr capsule  Commonly  known as:  TIAZAC  Take 360 mg by mouth daily.     FREESTYLE LITE test strip  Generic drug:  glucose blood     glucose blood test strip  Commonly known as:  FREESTYLE LITE  Use as instructed to check blood sugar 2 times per day dx code 250.02     insulin aspart 100 UNIT/ML FlexPen  Commonly known as:  NOVOLOG FLEXPEN  Inject 9-10 units 3 times per day     Insulin Detemir 100 UNIT/ML Pen  Commonly known as:  LEVEMIR FLEXTOUCH  Inject 10 Units into the skin daily at 10 pm.     Insulin Pen Needle 31G X 5 MM Misc  Commonly known as:  B-D UF III MINI PEN NEEDLES  1 each by Does not apply route 2 (two) times daily.     latanoprost 0.005 % ophthalmic solution  Commonly known as:  XALATAN  Place 0.005 % into the left eye as directed.     Liraglutide 18 MG/3ML Sopn  Inject 1.8 mg into the skin daily.     megestrol 20 MG tablet  Commonly known as:  MEGACE  Take 20 mg by mouth 2 (two) times daily.     metFORMIN 500 MG tablet  Commonly known as:  GLUCOPHAGE  Take 1 tablet (500 mg total) by mouth 3 (three) times daily.     pioglitazone 30 MG tablet  Commonly known as:  ACTOS  Take 1 tablet (30 mg total) by mouth daily.     prednisoLONE acetate 1 % ophthalmic suspension  Commonly known as:  PRED FORTE  Place into both eyes 2 (two) times daily.     tamsulosin 0.4 MG Caps capsule  Commonly known as:  FLOMAX     TEKTURNA HCT 150-12.5 MG Tabs  Generic drug:  Aliskiren-Hydrochlorothiazide  Take 12.5 mg by mouth daily.     VITAMIN D PO  Take by mouth daily. Taking 5000 daily        Allergies: No Known Allergies  Past Medical History  Diagnosis Date  . Hypertension   . Diabetes mellitus   . Heart murmur     SYSTOLIC MURMUR AT BASE  . Hyperlipidemia   . Prostate cancer     Past Surgical History  Procedure Laterality Date  . Tonsillectomy      IN [facility]  . Hemorroidectomy  1969  . Hernia repair  AFE 18-17  . Hernia repair  1981    DR WEATHERLY    No family history  on file.  Social History:  reports that he quit smoking about 35 years ago. His smoking use included Cigarettes. He has a 6 pack-year smoking history. He has never used smokeless tobacco. He reports that he does not drink alcohol or use illicit drugs.  Review of Systems:   HYPERTENSION:  well controlled usually, followed by cardiologist.   Now he is taking Coreg 12.5 mg along with Tekturna HCT and diltiazem  Also appears to have persistent heart rate although he does not complain of palpitations.  He had been evaluated by cardiologist and was recommended higher doses of Coreg. He also has tendency to sweating at night and feeling tired but his thyroid functions are normal He does not think he has significant shortness of breath except when he is more active and has not discussed this with his cardiologist   Lab Results  Component Value Date   TSH 3.22 07/24/2014    He says his left knee and thigh pain is better with chiropractic treatment and he is starting to get more active  HYPERLIPIDEMIA: The lipid abnormality consists of elevated LDL, on Lipitor for several years but because of high liver functions he has been taken off the 40 mg dose and changed to 10 mg by cardiologist, LDL excellent now Liver functions have been fairly good  Lab Results  Component Value Date   CHOL 120 08/19/2014   HDL 40.90 08/19/2014   LDLCALC 62 08/19/2014   TRIG 87.0 08/19/2014   CHOLHDL 3 08/19/2014     Chronic renal insufficiency: His creatinine is relatively better and is avoiding Aleve now    Lab Results  Component Value Date   CREATININE 1.19 09/22/2014        Examination:   BP 126/60 mmHg  Pulse 106  Temp(Src) 98 F (36.7 C) (Oral)  Ht 5\' 11"  (1.803 m)  Wt 223 lb (101.152 kg)  BMI 31.12 kg/m2  SpO2 96%  Body mass index is 31.12 kg/(m^2).   Heart sounds normal, no gallop  no ankle edema Diabetic foot exam shows normal monofilament sensation in the toes and plantar surfaces, no  skin lesions or ulcers on the feet and normal pedal pulses  ASSESSMENT/ PLAN:   Hypertension: His blood pressure is very well controlled Even though he was told to stop Tekturna he is still taking this along with his Coreg but is taking the Coreg only once a day Advised him to check his blood pressure periodically at home and also his pulse Meanwhile he can try taking the Coreg half tablet twice a day for consistent 24 control  Diabetes type 2 with mild obesity. His A1c has been over 8% although he has by recall only occasional high readings at home Most likely is having high postprandial readings since he thinks his fasting readings are fairly good Post prandial reading was relatively high in the lab also at 10 AM Also seems to have high readings after supper He needs to be consistent with his diet with balanced low fat meals especially when he is traveling  Encouraged him to stay consistently active Discussed needing to get the weight down again  For now we will have him continue his multidrug regimen including Victoza and basal bolus insulin He will  bring his monitor on his next visit for review  Mild CKD: Creatinine better now.  HYPERLIPIDEMIA: Excellent control with 10 mg Lipitor   Patient Instructions  Check BP and pulse  Increase Novolog 2 units in am and at supper  Please check blood sugars at least half the time about 2 hours after any meal and 3 times per week on waking up. Please bring blood sugar monitor to each visit. Recommended blood sugar levels about 2 hours after meal is 140-180 and on waking up 90-130    Counseling time over 50% of today's 25 minute visit   Ashyah Quizon 09/25/2014, 3:28 PM

## 2014-10-06 ENCOUNTER — Other Ambulatory Visit: Payer: Self-pay | Admitting: *Deleted

## 2014-10-06 MED ORDER — METFORMIN HCL 500 MG PO TABS: 500.0000 mg | ORAL_TABLET | Freq: Three times a day (TID) | ORAL | Status: DC

## 2014-10-06 MED ORDER — INSULIN DETEMIR 100 UNIT/ML FLEXPEN: 10.0000 [IU] | PEN_INJECTOR | Freq: Every day | SUBCUTANEOUS | Status: DC

## 2014-10-15 ENCOUNTER — Other Ambulatory Visit (HOSPITAL_COMMUNITY): Payer: Self-pay | Admitting: Urology

## 2014-10-15 ENCOUNTER — Other Ambulatory Visit: Payer: Self-pay | Admitting: *Deleted

## 2014-10-15 DIAGNOSIS — C61 Malignant neoplasm of prostate: Secondary | ICD-10-CM

## 2014-10-15 DIAGNOSIS — M25552 Pain in left hip: Secondary | ICD-10-CM

## 2014-10-15 NOTE — Telephone Encounter (Signed)
Ok to refill 

## 2014-10-16 MED ORDER — TEKTURNA HCT 150-12.5 MG PO TABS: 12.5000 mg | ORAL_TABLET | Freq: Every day | ORAL | Status: DC

## 2014-10-22 ENCOUNTER — Telehealth: Payer: Self-pay | Admitting: Endocrinology

## 2014-10-22 NOTE — Telephone Encounter (Signed)
Patient would a meter called into his pharmacy   Rx: Accu check with strips   Pharmacy: [facility]    Thank you

## 2014-10-23 ENCOUNTER — Other Ambulatory Visit: Payer: Self-pay | Admitting: *Deleted

## 2014-10-23 MED ORDER — GLUCOSE BLOOD VI STRP: ORAL_STRIP | Status: DC

## 2014-10-23 MED ORDER — ACCU-CHEK SOFTCLIX LANCETS MISC: Status: DC

## 2014-10-23 MED ORDER — ACCU-CHEK AVIVA PLUS W/DEVICE KIT
PACK | Status: DC
Start: 2014-10-23 — End: 2015-07-24

## 2014-10-23 NOTE — Telephone Encounter (Signed)
rx has been sent 

## 2014-10-29 ENCOUNTER — Encounter (HOSPITAL_COMMUNITY)
Admission: RE | Admit: 2014-10-29 | Discharge: 2014-10-29 | Disposition: A | Discharge status: Home / Self Care | Payer: Medicare Other | Source: Ambulatory Visit | Attending: Urology | Admitting: Urology

## 2014-10-29 ENCOUNTER — Ambulatory Visit (HOSPITAL_COMMUNITY)
Admission: RE | Admit: 2014-10-29 | Discharge: 2014-10-29 | Disposition: A | Discharge status: Home / Self Care | Payer: Medicare Other | Source: Ambulatory Visit | Attending: Urology | Admitting: Urology

## 2014-10-29 DIAGNOSIS — R937 Abnormal findings on diagnostic imaging of other parts of musculoskeletal system: Secondary | ICD-10-CM | POA: Diagnosis not present

## 2014-10-29 DIAGNOSIS — M25552 Pain in left hip: Secondary | ICD-10-CM | POA: Diagnosis present

## 2014-10-29 DIAGNOSIS — C61 Malignant neoplasm of prostate: Secondary | ICD-10-CM

## 2014-10-29 MED ORDER — TECHNETIUM TC 99M MEDRONATE IV KIT
26.3000 | PACK | Freq: Once | INTRAVENOUS | Status: AC | PRN
  Administered 2014-10-29: 26.3 via INTRAVENOUS

## 2014-10-31 ENCOUNTER — Encounter: Payer: Self-pay | Admitting: Radiation Oncology

## 2014-10-31 NOTE — Progress Notes (Signed)
GU Location of Tumor / Histology: prostatic adenocarcinoma  If Prostate Cancer, Gleason Score is (5 + 5) and PSA was 30.57 on 10/07/2013 then, 161.11 on 08/20/14.  [name] presented to [name] in 2006 for terminal hematuria. Seen in January 2015 for BPH and LUTs referred by Dr. Mare Ferrari to [facility]. Returned January 2015 with nocturia x3. Double urine stream with spray, straining to void, occasional urgency and gross hematuria.   Biopsies of prostate (if applicable) revealed:    Past/Anticipated interventions by urology, if any: androgen deprivation, Xgeva  Past/Anticipated interventions by medical oncology, if any: no  Weight changes, if any: no  Bowel/Bladder complaints, if any: Denies bothersome lower urinary tract symptoms at this time or gross hematuria  Nausea/Vomiting, if any: no  Pain issues, if any:  yes, left knee pain related to effects of arthritis but, no back pain  SAFETY ISSUES:  Prior radiation? no  Pacemaker/ICD? no  Possible current pregnancy? no  Is the patient on methotrexate? no  Current Complaints / other details:  79 year old male. Single. Enlarging bilateral iliac lymph nodes and bone mets.

## 2014-11-02 NOTE — Progress Notes (Signed)
Biopsy     CT 4/14 showed pelvic adenopathy  Bone scan 4/14 showed left pelvic and right 11th rib mets     Started androgen deprivation and Xgeva  PSA up to 16.11 on 08/20/14.  Repeat bone scan on 10/29/14 shows interval development of diffuse metastases:

## 2014-11-03 ENCOUNTER — Telehealth: Payer: Self-pay | Admitting: Oncology

## 2014-11-03 ENCOUNTER — Ambulatory Visit
Admission: RE | Admit: 2014-11-03 | Discharge: 2014-11-03 | Disposition: A | Discharge status: Home / Self Care | Payer: Medicare Other | Source: Ambulatory Visit | Attending: Radiation Oncology | Admitting: Radiation Oncology

## 2014-11-03 ENCOUNTER — Encounter: Payer: Self-pay | Admitting: Radiation Oncology

## 2014-11-03 VITALS — BP 111/50 | HR 91 | Temp 98.0°F | Resp 16 | Ht 71.0 in | Wt 221.7 lb

## 2014-11-03 DIAGNOSIS — C61 Malignant neoplasm of prostate: Secondary | ICD-10-CM | POA: Diagnosis not present

## 2014-11-03 NOTE — Progress Notes (Signed)
Radiation Oncology         [phone] ________________________________  Initial outpatient Consultation  Name: Jeremy Jennings MRN: [id number]  Date: 11/03/2014  DOB: 12-30-1935  ZD:GUYQIH Jeremy Ferrari, MD  Festus Aloe, MD   REFERRING PHYSICIAN: Festus Aloe, MD  DIAGNOSIS: The encounter diagnosis was Prostate cancer.    ICD-9-CM ICD-10-CM   1. Prostate cancer [place] D [name] is a 79 y.o. male was diagnosed with prostate cancer in 2015. At that time, his PSA was 30 and his biopsy showed a Gleason's Score of 4+5 and 5+5 throughout all cores:     Staging CT 4/14 showed pelvic adenopathy and bone scan 4/14 showed left pelvic and right 11th rib met     Started androgen deprivation and Xgeva  PSA nadired at 1.86 in July and has increased now up to 16.11 on 08/20/14.  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   PREVIOUS RADIATION THERAPY: No  PAST MEDICAL HISTORY:  has a past medical history of Hypertension; Diabetes mellitus; Heart murmur; Hyperlipidemia; and Prostate cancer.    PAST SURGICAL HISTORY: Past Surgical History  Procedure Laterality Date  . Tonsillectomy      IN [facility]  . Hemorroidectomy  1969  . Hernia repair  AFE 16-17  . Hernia repair  1981    DR WEATHERLY    FAMILY HISTORY: family history is negative for Cancer.  SOCIAL HISTORY:  reports that he quit smoking about 35 years ago. His smoking use included Cigarettes. He has a 5 pack-year smoking history. He has never used smokeless tobacco. He reports that he does not drink alcohol or use illicit drugs.  ALLERGIES: Review of patient's allergies indicates no known allergies.  MEDICATIONS:  Current Outpatient Prescriptions  Medication Sig Dispense Refill  . ACCU-CHEK SOFTCLIX LANCETS  lancets Use as instructed to check blood sugar 2 times per day dx code E11.65 100 each 3  . atorvastatin (LIPITOR) 10 MG tablet take 1 tablet by mouth once daily 30 tablet 5  . Blood Glucose Monitoring Suppl (ACCU-CHEK AVIVA PLUS) W/DEVICE KIT Use to check blood sugar 2 times per day dx code E11.65 1 kit 0  . carvedilol (COREG) 12.5 MG tablet Take 1 tablet (12.5 mg total) by mouth 2 (two) times daily with a meal. 60 tablet 5  . Cholecalciferol (VITAMIN D PO) Take by mouth daily. Taking 5000 daily    . COMBIGAN 0.2-0.5 % ophthalmic solution     . diltiazem (TIAZAC) 360 MG 24 hr capsule Take 360 mg by mouth daily.      Marland Kitchen glucose blood (ACCU-CHEK AVIVA PLUS) test strip Use as instructed to check blood sugar 2 times per day dx code E11.65 100 each 3  . insulin aspart (NOVOLOG FLEXPEN) 100 UNIT/ML FlexPen Inject 9-10 units 3 times per day 15 mL 3  . Insulin Detemir (LEVEMIR FLEXTOUCH) 100 UNIT/ML Pen Inject 10 Units into the skin daily at 10 pm. 15 mL 2  . Insulin Pen Needle (B-D UF III MINI PEN NEEDLES) 31G X 5 MM MISC 1 each by Does not apply route 2 (two) times daily. 100 each 11  . latanoprost (XALATAN) 0.005 % ophthalmic solution Place 0.005 % into the left eye as directed.     . Liraglutide 18 MG/3ML SOPN Inject 1.8 mg into the  skin daily. 9 pen 1  . megestrol (MEGACE) 20 MG tablet Take 20 mg by mouth 2 (two) times daily.    . metFORMIN (GLUCOPHAGE) 500 MG tablet Take 1 tablet (500 mg total) by mouth 3 (three) times daily. 270 tablet 1  . pioglitazone (ACTOS) 30 MG tablet Take 1 tablet (30 mg total) by mouth daily. 90 tablet 1  . prednisoLONE acetate (PRED FORTE) 1 % ophthalmic suspension Place into both eyes 2 (two) times daily.     . tamsulosin (FLOMAX) 0.4 MG CAPS capsule     . TEKTURNA HCT 150-12.5 MG TABS Take 12.5 mg by mouth daily. 30 each 3   No current facility-administered medications for this encounter.    REVIEW OF SYSTEMS:  A 15 point review of systems is documented in the  electronic medical record. This was obtained by the nursing staff. However, I reviewed this with the patient to discuss relevant findings and make appropriate changes.  A comprehensive review of systems was negative except for: Musculoskeletal: positive for left hip, knee, and femur pain. The pt currently takes Aleve for the pain. Has difficulty moving his leg when he wakes up in the morning. The pt denies additional pain at this time.   PHYSICAL EXAM:  height is 5' 11" (1.803 m) and weight is 221 lb 11.2 oz (100.562 kg). His oral temperature is 98 F (36.7 C). His blood pressure is 111/50 and his pulse is 91. His respiration is 16 and oxygen saturation is 100%.  The patient has localized pain in the left leg.  The patient is in no acute distress today. Is alert and oriented.  KPS = 90  100 - Normal; no complaints; no evidence of disease. 90   - Able to carry on normal activity; minor signs or symptoms of disease. 80   - Normal activity with effort; some signs or symptoms of disease. 5   - Cares for self; unable to carry on normal activity or to do active work. 60   - Requires occasional assistance, but is able to care for most of his personal needs. 50   - Requires considerable assistance and frequent medical care. 66   - Disabled; requires special care and assistance. 2   - Severely disabled; hospital admission is indicated although death not imminent. 73   - Very sick; hospital admission necessary; active supportive treatment necessary. 10   - Moribund; fatal processes progressing rapidly. 0     - Dead  Karnofsky DA, Abelmann [place], Craver LS and Burchenal [facility] [phone]) The use of the nitrogen mustards in the palliative treatment of carcinoma: with particular reference to bronchogenic carcinoma Cancer 1 [phone]  LABORATORY DATA:  No results found for: WBC, HGB, HCT, MCV, PLT Lab Results  Component Value Date   NA 136 09/22/2014   K 4.2 09/22/2014   CL 103 09/22/2014   CO2 27 09/22/2014    Lab Results  Component Value Date   ALT 11 09/22/2014   AST 11 09/22/2014   ALKPHOS 182* 09/22/2014   BILITOT 0.2 09/22/2014     RADIOGRAPHY: Dg Pelvis 1-2 Views  10/29/2014   CLINICAL DATA:  Left hip pain, no known injury, prostate cancer  EXAM: PELVIS - 1-2 VIEW  COMPARISON:  04/30/2014 bone scan  FINDINGS: Single frontal view of the pelvis submitted. There is sclerosis in left acetabulum and left iliac bone highly suspicious for metastatic disease. Mild narrowing of superior left hip joint space. No hip subluxation. No pathologic fracture.  IMPRESSION: There is sclerosis in left acetabulum and left iliac bone highly suspicious for metastatic disease. Mild narrowing of superior left hip joint space. No subluxation. No pathologic fracture.   Electronically Signed   By: Lahoma Crocker M.D.   On: 10/29/2014 11:34   Nm Bone Scan Whole Body  10/29/2014   CLINICAL DATA:  History of metastatic prostate malignancy, elevated PSA, currently asymptomatic.  EXAM: NUCLEAR MEDICINE WHOLE BODY BONE SCAN  TECHNIQUE: Whole body anterior and posterior images were obtained approximately 3 hours after intravenous injection of radiopharmaceutical.  RADIOPHARMACEUTICALS:  26.3 mCi Technetium-99 MDP  COMPARISON:  Nuclear bone scan of June 30, 2014  FINDINGS: There is adequate uptake of the radiopharmaceutical by the skeleton. Adequate soft tissue clearance and renal activity is present.  Calvarium: There is a punctate focus of increased uptake in the paramedian region of the occipital bone. There is minimally increased uptake in the left lateral parietal bone.  Spine: There are multiple foci of increased uptake within the cervical, thoracic, and lumbar spine. These are new since the previous study. The most significant uptake is noted an approximately T2, T10, and L1.  Ribs and sternum: There are multiple bilateral foci of increased uptake consistent with widespread metastases.  Pectoral girdle: There is a focus of  increased uptake in the left proximal humeral shaft. There is also mildly increased uptake in the left humeral neck.  Pelvis and pelvic girdle: There is evidence of progressive disease involving the left hemipelvis including the left sacral ala and mid body of the sacrum. Abnormal activity in the ischium and pubic bones on the left is present and more conspicuous. There are at least 2 foci of increased uptake in the proximal and mid shaft of the right femur. There is mildly increased uptake in the intertrochanteric region and neck of the right femur.  COMPLICATIONS: Interval development of innumerable new widespread bony metastases as described with progressive changes in the left hemipelvis.   Electronically Signed   By: David  Martinique M.D.   On: 10/29/2014 14:27      IMPRESSION: The pain that the patient is experiencing in the left leg could be caused by the cancer. The patient is a 79 y/o male with high risk Stage 4 prostate cancer that may be becoming hormone refractory. There are no skeletal metastases that are causing enough pain to warrant radiation treatment. The patient is a potential candidate for external radition if more pain arises. The patient is also a potential candidate for radium infusion if more pain occurs. At this time, it is prudent to get an opinion from Dr. Alen Blew before radiation contemplation.  PLAN: Schedule a follow up with Dr. Alen Blew. Return to [facility]Rad for a follow up in 2-4 weeks.  I spent 30 minutes minutes face to face with the patient and more than 50% of that time was spent in counseling and/or coordination of care.   This document serves as a record of services personally performed by Tyler Pita, MD. It was created on his behalf by Darcus Austin, a trained medical scribe. The creation of this record is based on the scribe's personal observations and the provider's statements to them. This document has been checked and approved by the attending provider.      ------------------------------------------------  Sheral Apley Tammi Klippel, M.D.

## 2014-11-03 NOTE — Telephone Encounter (Signed)
added appt Santiago Glad from radiation will advised pt on appt.

## 2014-11-03 NOTE — Progress Notes (Signed)
See progress note under physician encounter. 

## 2014-11-04 ENCOUNTER — Telehealth: Payer: Self-pay | Admitting: Oncology

## 2014-11-04 ENCOUNTER — Other Ambulatory Visit: Payer: Self-pay | Admitting: Oncology

## 2014-11-04 ENCOUNTER — Telehealth: Payer: Self-pay | Admitting: *Deleted

## 2014-11-04 NOTE — Telephone Encounter (Signed)
Patient states he would like an earlier appointment due to ongoing pain. POF sent to scheduler.

## 2014-11-04 NOTE — Telephone Encounter (Signed)
Patient would like for his appointment to be changed from 12/03/14. He would like to see the MD sooner. Patient suggested next week. Message sent to scheduler.

## 2014-11-04 NOTE — Telephone Encounter (Signed)
s.w. lashauna in triage to confirm pof....she advised me to wait to sched todays pof once Dr. Alen Blew response to her.

## 2014-11-04 NOTE — Telephone Encounter (Signed)
CALLED PATIENT TO INFORM OF APPT. WITH DR. SHADAD ON 12/03/14 @ 8:30 AM AND HIS FU WITH DR. MANNING ON 12/04/14 @ 9:45 AM, LVM FOR A RETURN CALL

## 2014-11-05 ENCOUNTER — Telehealth: Payer: Self-pay | Admitting: Oncology

## 2014-11-05 NOTE — Telephone Encounter (Signed)
lvm for pt regarding to 5.6 appt....mailed pt appt sched and letter

## 2014-11-09 ENCOUNTER — Emergency Department (HOSPITAL_COMMUNITY): Payer: Medicare Other

## 2014-11-09 ENCOUNTER — Encounter (HOSPITAL_COMMUNITY): Payer: Self-pay | Admitting: Emergency Medicine

## 2014-11-09 ENCOUNTER — Observation Stay (HOSPITAL_COMMUNITY)
Admission: EM | Admit: 2014-11-09 | Discharge: 2014-11-11 | Disposition: A | Discharge status: Home / Self Care | Payer: Medicare Other | Attending: Internal Medicine | Admitting: Internal Medicine

## 2014-11-09 DIAGNOSIS — Z791 Long term (current) use of non-steroidal anti-inflammatories (NSAID): Secondary | ICD-10-CM | POA: Diagnosis not present

## 2014-11-09 DIAGNOSIS — Z8546 Personal history of malignant neoplasm of prostate: Secondary | ICD-10-CM | POA: Diagnosis not present

## 2014-11-09 DIAGNOSIS — Z79818 Long term (current) use of other agents affecting estrogen receptors and estrogen levels: Secondary | ICD-10-CM | POA: Diagnosis not present

## 2014-11-09 DIAGNOSIS — Z8249 Family history of ischemic heart disease and other diseases of the circulatory system: Secondary | ICD-10-CM | POA: Diagnosis not present

## 2014-11-09 DIAGNOSIS — C61 Malignant neoplasm of prostate: Secondary | ICD-10-CM | POA: Diagnosis not present

## 2014-11-09 DIAGNOSIS — C7951 Secondary malignant neoplasm of bone: Secondary | ICD-10-CM | POA: Insufficient documentation

## 2014-11-09 DIAGNOSIS — R000 Tachycardia, unspecified: Secondary | ICD-10-CM | POA: Diagnosis not present

## 2014-11-09 DIAGNOSIS — D649 Anemia, unspecified: Secondary | ICD-10-CM | POA: Diagnosis not present

## 2014-11-09 DIAGNOSIS — Z87891 Personal history of nicotine dependence: Secondary | ICD-10-CM | POA: Diagnosis not present

## 2014-11-09 DIAGNOSIS — Z79899 Other long term (current) drug therapy: Secondary | ICD-10-CM | POA: Diagnosis not present

## 2014-11-09 DIAGNOSIS — Z794 Long term (current) use of insulin: Secondary | ICD-10-CM | POA: Insufficient documentation

## 2014-11-09 DIAGNOSIS — I10 Essential (primary) hypertension: Secondary | ICD-10-CM | POA: Diagnosis not present

## 2014-11-09 DIAGNOSIS — R109 Unspecified abdominal pain: Secondary | ICD-10-CM | POA: Diagnosis present

## 2014-11-09 DIAGNOSIS — R079 Chest pain, unspecified: Secondary | ICD-10-CM | POA: Insufficient documentation

## 2014-11-09 DIAGNOSIS — N281 Cyst of kidney, acquired: Secondary | ICD-10-CM | POA: Diagnosis not present

## 2014-11-09 DIAGNOSIS — R1084 Generalized abdominal pain: Secondary | ICD-10-CM | POA: Insufficient documentation

## 2014-11-09 DIAGNOSIS — I209 Angina pectoris, unspecified: Secondary | ICD-10-CM

## 2014-11-09 DIAGNOSIS — E1165 Type 2 diabetes mellitus with hyperglycemia: Secondary | ICD-10-CM | POA: Diagnosis not present

## 2014-11-09 DIAGNOSIS — D638 Anemia in other chronic diseases classified elsewhere: Secondary | ICD-10-CM

## 2014-11-09 DIAGNOSIS — Z7952 Long term (current) use of systemic steroids: Secondary | ICD-10-CM | POA: Diagnosis not present

## 2014-11-09 DIAGNOSIS — E785 Hyperlipidemia, unspecified: Secondary | ICD-10-CM | POA: Diagnosis not present

## 2014-11-09 LAB — COMPREHENSIVE METABOLIC PANEL
ALK PHOS: 254 U/L — AB (ref 38–126)
ALT: 19 U/L (ref 17–63)
ALT: 19 U/L (ref 17–63)
ANION GAP: 8 (ref 5–15)
AST: 16 U/L (ref 15–41)
AST: 13 U/L — AB (ref 15–41)
Albumin: 2.5 g/dL — ABNORMAL LOW (ref 3.5–5.0)
Albumin: 2.5 g/dL — ABNORMAL LOW (ref 3.5–5.0)
Alkaline Phosphatase: 260 U/L — ABNORMAL HIGH (ref 38–126)
Anion gap: 7 (ref 5–15)
BILIRUBIN TOTAL: 0.3 mg/dL (ref 0.3–1.2)
BUN: 22 mg/dL — AB (ref 6–20)
BUN: 19 mg/dL (ref 6–20)
CO2: 20 mmol/L — ABNORMAL LOW (ref 22–32)
CO2: 23 mmol/L (ref 22–32)
Calcium: 7.9 mg/dL — ABNORMAL LOW (ref 8.9–10.3)
Calcium: 7.9 mg/dL — ABNORMAL LOW (ref 8.9–10.3)
Chloride: 106 mmol/L (ref 101–111)
Chloride: 105 mmol/L (ref 101–111)
Creatinine, Ser: 1.16 mg/dL (ref 0.61–1.24)
Creatinine, Ser: 0.94 mg/dL (ref 0.61–1.24)
GFR calc Af Amer: >60 mL/min (ref >60)
GFR calc non Af Amer: 59 mL/min — ABNORMAL LOW (ref >60)
GFR calc non Af Amer: >60 mL/min (ref >60)
GLUCOSE: 302 mg/dL — AB (ref 70–99)
GLUCOSE: 217 mg/dL — AB (ref 70–99)
Potassium: 4.6 mmol/L (ref 3.5–5.1)
Potassium: 3.9 mmol/L (ref 3.5–5.1)
SODIUM: 135 mmol/L (ref 135–145)
Sodium: 134 mmol/L — ABNORMAL LOW (ref 135–145)
TOTAL PROTEIN: 7 g/dL (ref 6.5–8.1)
Total Bilirubin: 0.6 mg/dL (ref 0.3–1.2)
Total Protein: 6.8 g/dL (ref 6.5–8.1)

## 2014-11-09 LAB — URINALYSIS, ROUTINE W REFLEX MICROSCOPIC
BILIRUBIN URINE: NEGATIVE
Glucose, UA: 500 mg/dL — AB
HGB URINE DIPSTICK: NEGATIVE
KETONES UR: NEGATIVE mg/dL
Leukocytes, UA: NEGATIVE
Nitrite: NEGATIVE
PROTEIN: NEGATIVE mg/dL
Specific Gravity, Urine: 1.026 (ref 1.005–1.030)
Urobilinogen, UA: 0.2 mg/dL (ref 0.0–1.0)
pH: 5 (ref 5.0–8.0)

## 2014-11-09 LAB — CBC WITH DIFFERENTIAL/PLATELET
BASOS ABS: 0 10*3/uL (ref 0.0–0.1)
Basophils Absolute: 0 10*3/uL (ref 0.0–0.1)
Basophils Relative: 0 % (ref 0–1)
Basophils Relative: 0 % (ref 0–1)
EOS PCT: 1 % (ref 0–5)
Eosinophils Absolute: 0.1 10*3/uL (ref 0.0–0.7)
Eosinophils Absolute: 0.1 10*3/uL (ref 0.0–0.7)
Eosinophils Relative: 1 % (ref 0–5)
HCT: 23.4 % — ABNORMAL LOW (ref 39.0–52.0)
HCT: 23.1 % — ABNORMAL LOW (ref 39.0–52.0)
Hemoglobin: 7.4 g/dL — ABNORMAL LOW (ref 13.0–17.0)
Hemoglobin: 7.2 g/dL — ABNORMAL LOW (ref 13.0–17.0)
LYMPHS ABS: 0.8 10*3/uL (ref 0.7–4.0)
LYMPHS ABS: 1 10*3/uL (ref 0.7–4.0)
LYMPHS PCT: 12 % (ref 12–46)
LYMPHS PCT: 16 % (ref 12–46)
MCH: 27.4 pg (ref 26.0–34.0)
MCH: 26.9 pg (ref 26.0–34.0)
MCHC: 31.6 g/dL (ref 30.0–36.0)
MCHC: 31.2 g/dL (ref 30.0–36.0)
MCV: 86.7 fL (ref 78.0–100.0)
MCV: 86.2 fL (ref 78.0–100.0)
Monocytes Absolute: 0.6 10*3/uL (ref 0.1–1.0)
Monocytes Absolute: 0.7 10*3/uL (ref 0.1–1.0)
Monocytes Relative: 8 % (ref 3–12)
Monocytes Relative: 11 % (ref 3–12)
NEUTROS ABS: 4.6 10*3/uL (ref 1.7–7.7)
NEUTROS PCT: 72 % (ref 43–77)
Neutro Abs: 5.5 10*3/uL (ref 1.7–7.7)
Neutrophils Relative %: 79 % — ABNORMAL HIGH (ref 43–77)
PLATELETS: 415 10*3/uL — AB (ref 150–400)
Platelets: 379 10*3/uL (ref 150–400)
RBC: 2.7 MIL/uL — ABNORMAL LOW (ref 4.22–5.81)
RBC: 2.68 MIL/uL — AB (ref 4.22–5.81)
RDW: 16.2 % — ABNORMAL HIGH (ref 11.5–15.5)
RDW: 16 % — AB (ref 11.5–15.5)
WBC: 6.9 10*3/uL (ref 4.0–10.5)
WBC: 6.3 10*3/uL (ref 4.0–10.5)

## 2014-11-09 LAB — IRON AND TIBC
Iron: 18 ug/dL — ABNORMAL LOW (ref 45–182)
SATURATION RATIOS: 9 % — AB (ref 17.9–39.5)
TIBC: 203 ug/dL — ABNORMAL LOW (ref 250–450)
UIBC: 185 ug/dL

## 2014-11-09 LAB — PROTIME-INR
INR: 1.17 (ref 0.00–1.49)
PROTHROMBIN TIME: 15.1 s (ref 11.6–15.2)

## 2014-11-09 LAB — TSH: TSH: 2.676 u[IU]/mL (ref 0.350–4.500)

## 2014-11-09 LAB — RETICULOCYTES
RBC.: 2.67 MIL/uL — ABNORMAL LOW (ref 4.22–5.81)
Retic Count, Absolute: 34.7 10*3/uL (ref 19.0–186.0)
Retic Ct Pct: 1.3 % (ref 0.4–3.1)

## 2014-11-09 LAB — TROPONIN I
Troponin I: <0.03 ng/mL (ref <0.031)
Troponin I: <0.03 ng/mL (ref <0.031)

## 2014-11-09 LAB — MAGNESIUM: Magnesium: 2.3 mg/dL (ref 1.7–2.4)

## 2014-11-09 LAB — PHOSPHORUS: Phosphorus: 2.4 mg/dL — ABNORMAL LOW (ref 2.5–4.6)

## 2014-11-09 LAB — GLUCOSE, CAPILLARY: GLUCOSE-CAPILLARY: 202 mg/dL — AB (ref 70–99)

## 2014-11-09 LAB — POC OCCULT BLOOD, ED: Fecal Occult Bld: NEGATIVE

## 2014-11-09 LAB — APTT: aPTT: 35 seconds (ref 24–37)

## 2014-11-09 LAB — VITAMIN B12: VITAMIN B 12: 930 pg/mL — AB (ref 180–914)

## 2014-11-09 LAB — PREPARE RBC (CROSSMATCH)

## 2014-11-09 LAB — FERRITIN: Ferritin: 575 ng/mL — ABNORMAL HIGH (ref 24–336)

## 2014-11-09 LAB — LIPASE, BLOOD: Lipase: 46 U/L (ref 22–51)

## 2014-11-09 LAB — ABO/RH: ABO/RH(D): B POS

## 2014-11-09 LAB — FOLATE: Folate: 13.1 ng/mL (ref >5.9)

## 2014-11-09 MED ORDER — PIOGLITAZONE HCL 30 MG PO TABS
30.0000 mg | ORAL_TABLET | Freq: Every day | ORAL | Status: DC
Start: 2014-11-09 — End: 2014-11-11
  Administered 2014-11-09 – 2014-11-11 (×3): 30 mg via ORAL
  Filled 2014-11-09 (×3): qty 1

## 2014-11-09 MED ORDER — ALISKIREN FUMARATE 150 MG PO TABS
150.0000 mg | ORAL_TABLET | Freq: Every day | ORAL | Status: DC
  Administered 2014-11-09 – 2014-11-11 (×3): 150 mg via ORAL
  Filled 2014-11-09 (×3): qty 1

## 2014-11-09 MED ORDER — TIMOLOL MALEATE 0.5 % OP SOLN
1.0000 [drp] | Freq: Two times a day (BID) | OPHTHALMIC | Status: DC
  Filled 2014-11-09: qty 5

## 2014-11-09 MED ORDER — BRIMONIDINE TARTRATE-TIMOLOL 0.2-0.5 % OP SOLN: 1.0000 [drp] | Freq: Two times a day (BID) | OPHTHALMIC | Status: DC

## 2014-11-09 MED ORDER — TAMSULOSIN HCL 0.4 MG PO CAPS
0.4000 mg | ORAL_CAPSULE | Freq: Every day | ORAL | Status: DC
  Administered 2014-11-09 – 2014-11-11 (×3): 0.4 mg via ORAL
  Filled 2014-11-09 (×3): qty 1

## 2014-11-09 MED ORDER — ONDANSETRON HCL 4 MG PO TABS: 4.0000 mg | ORAL_TABLET | Freq: Four times a day (QID) | ORAL | Status: DC | PRN

## 2014-11-09 MED ORDER — INSULIN DETEMIR 100 UNIT/ML FLEXPEN: 12.0000 [IU] | PEN_INJECTOR | Freq: Every day | SUBCUTANEOUS | Status: DC

## 2014-11-09 MED ORDER — LATANOPROST 0.005 % OP SOLN
1.0000 [drp] | Freq: Every day | OPHTHALMIC | Status: DC
  Administered 2014-11-09 – 2014-11-10 (×2): 1 [drp] via OPHTHALMIC
  Filled 2014-11-09: qty 2.5

## 2014-11-09 MED ORDER — PREDNISOLONE ACETATE 1 % OP SUSP
1.0000 [drp] | Freq: Two times a day (BID) | OPHTHALMIC | Status: DC
  Administered 2014-11-09 – 2014-11-11 (×4): 1 [drp] via OPHTHALMIC
  Filled 2014-11-09: qty 1

## 2014-11-09 MED ORDER — SODIUM CHLORIDE 0.9 % IV SOLN: 10.0000 mL/h | Freq: Once | INTRAVENOUS | Status: AC

## 2014-11-09 MED ORDER — ONDANSETRON HCL 4 MG/2ML IJ SOLN: 4.0000 mg | Freq: Four times a day (QID) | INTRAMUSCULAR | Status: DC | PRN

## 2014-11-09 MED ORDER — LIRAGLUTIDE 18 MG/3ML ~~LOC~~ SOPN: 1.8000 mg | PEN_INJECTOR | Freq: Every day | SUBCUTANEOUS | Status: DC

## 2014-11-09 MED ORDER — INSULIN DETEMIR 100 UNIT/ML ~~LOC~~ SOLN
12.0000 [IU] | Freq: Every day | SUBCUTANEOUS | Status: DC
  Administered 2014-11-09 – 2014-11-10 (×2): 12 [IU] via SUBCUTANEOUS
  Filled 2014-11-09 (×2): qty 0.12

## 2014-11-09 MED ORDER — MEGESTROL ACETATE 20 MG PO TABS
20.0000 mg | ORAL_TABLET | Freq: Two times a day (BID) | ORAL | Status: DC
  Administered 2014-11-09 – 2014-11-11 (×4): 20 mg via ORAL
  Filled 2014-11-09 (×5): qty 1

## 2014-11-09 MED ORDER — BRIMONIDINE TARTRATE 0.2 % OP SOLN
1.0000 [drp] | Freq: Two times a day (BID) | OPHTHALMIC | Status: DC
Start: 2014-11-09 — End: 2014-11-11
  Filled 2014-11-09: qty 5

## 2014-11-09 MED ORDER — ATORVASTATIN CALCIUM 10 MG PO TABS
10.0000 mg | ORAL_TABLET | Freq: Every day | ORAL | Status: DC
  Administered 2014-11-09 – 2014-11-11 (×3): 10 mg via ORAL
  Filled 2014-11-09 (×3): qty 1

## 2014-11-09 MED ORDER — INSULIN ASPART 100 UNIT/ML ~~LOC~~ SOLN
0.0000 [IU] | Freq: Three times a day (TID) | SUBCUTANEOUS | Status: DC
  Administered 2014-11-09: 5 [IU] via SUBCUTANEOUS
  Administered 2014-11-10: 3 [IU] via SUBCUTANEOUS
  Administered 2014-11-10: 5 [IU] via SUBCUTANEOUS
  Administered 2014-11-11: 3 [IU] via SUBCUTANEOUS

## 2014-11-09 MED ORDER — ALISKIREN-HYDROCHLOROTHIAZIDE 150-12.5 MG PO TABS: 12.5000 mg | ORAL_TABLET | Freq: Every day | ORAL | Status: DC

## 2014-11-09 MED ORDER — HYDROCHLOROTHIAZIDE 12.5 MG PO CAPS
12.5000 mg | ORAL_CAPSULE | Freq: Every day | ORAL | Status: DC
  Administered 2014-11-09 – 2014-11-11 (×3): 12.5 mg via ORAL
  Filled 2014-11-09 (×3): qty 1

## 2014-11-09 MED ORDER — ACETAMINOPHEN 325 MG PO TABS
650.0000 mg | ORAL_TABLET | Freq: Four times a day (QID) | ORAL | Status: DC | PRN
  Administered 2014-11-09 – 2014-11-11 (×5): 650 mg via ORAL
  Filled 2014-11-09 (×5): qty 2

## 2014-11-09 MED ORDER — ACETAMINOPHEN 650 MG RE SUPP: 650.0000 mg | Freq: Four times a day (QID) | RECTAL | Status: DC | PRN

## 2014-11-09 MED ORDER — DILTIAZEM HCL ER BEADS 240 MG PO CP24
360.0000 mg | ORAL_CAPSULE | Freq: Every day | ORAL | Status: DC
  Administered 2014-11-09 – 2014-11-11 (×3): 360 mg via ORAL
  Filled 2014-11-09 (×3): qty 1

## 2014-11-09 MED ORDER — METFORMIN HCL 500 MG PO TABS
500.0000 mg | ORAL_TABLET | Freq: Three times a day (TID) | ORAL | Status: DC
  Administered 2014-11-09 – 2014-11-11 (×5): 500 mg via ORAL
  Filled 2014-11-09 (×8): qty 1

## 2014-11-09 MED ORDER — CARVEDILOL 12.5 MG PO TABS
12.5000 mg | ORAL_TABLET | Freq: Two times a day (BID) | ORAL | Status: DC
  Administered 2014-11-09 – 2014-11-11 (×4): 12.5 mg via ORAL
  Filled 2014-11-09 (×6): qty 1

## 2014-11-09 MED ORDER — SODIUM CHLORIDE 0.9 % IV SOLN
INTRAVENOUS | Status: DC
  Administered 2014-11-09: 17:00:00 via INTRAVENOUS

## 2014-11-09 NOTE — ED Provider Notes (Signed)
CSN: [id number]     Arrival date & time 11/09/14  1026 History   First MD Initiated Contact with Patient 11/09/14 1106     Chief Complaint  Patient presents with  . Abdominal Pain  . Chest Pain     (Consider location/radiation/quality/duration/timing/severity/associated sxs/prior Treatment) HPI  79 year old male presents with intermittent upper abdominal pain and left-sided chest pain for the past 3 days. No nausea, vomiting, or diarrhea. Has been constipated for "a while" but his been using prune juice and had a normal bowel movement yesterday. The patient denies any current symptoms now. He has a history of prostate cancer and is being evaluated for possible radiation treatment. The patient does not know what makes the pain better or worse. No shortness of breath. No pleuritic symptoms. No urinary symptoms. When asked to point further worse pain is the worse he points to his lower chest, left upper quadrant, and seems to radiate into lower abdomen. Denies back/flank pain.  Past Medical History  Diagnosis Date  . Hypertension   . Diabetes mellitus   . Heart murmur     SYSTOLIC MURMUR AT BASE  . Hyperlipidemia   . Prostate cancer    Past Surgical History  Procedure Laterality Date  . Tonsillectomy      IN [facility]  . Hemorroidectomy  1969  . Hernia repair  AFE 16-17  . Hernia repair  1981    DR [place]   Family History  Problem Relation Age of Onset  . Cancer Neg Hx    History  Substance Use Topics  . Smoking status: Former Smoker -- 0.25 packs/day for 20 years    Types: Cigarettes    Quit date: 07/12/1979  . Smokeless tobacco: Never Used  . Alcohol Use: No    Review of Systems  Constitutional: Negative for fever.  Respiratory: Negative for shortness of breath.   Cardiovascular: Positive for chest pain.  Gastrointestinal: Positive for abdominal pain and constipation. Negative for nausea, vomiting, diarrhea and abdominal distention.  Genitourinary: Negative for dysuria,  hematuria and flank pain.  Musculoskeletal: Negative for back pain.  All other systems reviewed and are negative.     Allergies  Review of patient's allergies indicates no known allergies.  Home Medications   Prior to Admission medications   Medication Sig Start Date End Date Taking? Authorizing Provider  ACCU-CHEK SOFTCLIX LANCETS lancets Use as instructed to check blood sugar 2 times per day dx code E11.65 10/23/14   Elayne Snare, MD  atorvastatin (LIPITOR) 10 MG tablet take 1 tablet by mouth once daily 07/09/14   Darlin Coco, MD  Blood Glucose Monitoring Suppl (ACCU-CHEK AVIVA PLUS) W/DEVICE KIT Use to check blood sugar 2 times per day dx code E11.65 10/23/14   Elayne Snare, MD  carvedilol (COREG) 12.5 MG tablet Take 1 tablet (12.5 mg total) by mouth 2 (two) times daily with a meal. 08/19/14   Darlin Coco, MD  Cholecalciferol (VITAMIN D PO) Take by mouth daily. Taking 5000 daily    Historical Provider, MD  COMBIGAN 0.2-0.5 % ophthalmic solution  04/09/13   Historical Provider, MD  diltiazem (TIAZAC) 360 MG 24 hr capsule Take 360 mg by mouth daily.      Historical Provider, MD  glucose blood (ACCU-CHEK AVIVA PLUS) test strip Use as instructed to check blood sugar 2 times per day dx code E11.65 10/23/14   Elayne Snare, MD  insulin aspart (NOVOLOG FLEXPEN) 100 UNIT/ML FlexPen Inject 9-10 units 3 times per day 06/09/14   Ajay  Dwyane Dee, MD  Insulin Detemir (LEVEMIR FLEXTOUCH) 100 UNIT/ML Pen Inject 10 Units into the skin daily at 10 pm. 10/06/14   Elayne Snare, MD  Insulin Pen Needle (B-D UF III MINI PEN NEEDLES) 31G X 5 MM MISC 1 each by Does not apply route 2 (two) times daily. 04/16/14   Elayne Snare, MD  latanoprost (XALATAN) 0.005 % ophthalmic solution Place 0.005 % into the left eye as directed.  07/02/11   Historical Provider, MD  Liraglutide 18 MG/3ML SOPN Inject 1.8 mg into the skin daily. 06/09/14   Elayne Snare, MD  megestrol (MEGACE) 20 MG tablet Take 20 mg by mouth 2 (two) times daily.     Historical Provider, MD  metFORMIN (GLUCOPHAGE) 500 MG tablet Take 1 tablet (500 mg total) by mouth 3 (three) times daily. 10/06/14   Elayne Snare, MD  pioglitazone (ACTOS) 30 MG tablet Take 1 tablet (30 mg total) by mouth daily. 07/10/14   Elayne Snare, MD  prednisoLONE acetate (PRED FORTE) 1 % ophthalmic suspension Place into both eyes 2 (two) times daily.  04/25/13   Historical Provider, MD  tamsulosin (FLOMAX) 0.4 MG CAPS capsule  08/02/13   Historical Provider, MD  TEKTURNA HCT 150-12.5 MG TABS Take 12.5 mg by mouth daily. 10/16/14   Elayne Snare, MD   BP 154/72 mmHg  Pulse 105  Temp(Src) 97.6 F (36.4 C) (Oral)  Resp 16  SpO2 99% Physical Exam  Constitutional: He is oriented to person, place, and time. He appears well-developed and well-nourished.  HENT:  Head: Normocephalic and atraumatic.  Right Ear: External ear normal.  Left Ear: External ear normal.  Nose: Nose normal.  Eyes: Right eye exhibits no discharge. Left eye exhibits no discharge.  Neck: Neck supple.  Cardiovascular: Normal rate, regular rhythm, normal heart sounds and intact distal pulses.   Pulmonary/Chest: Effort normal and breath sounds normal. He has no wheezes. He has no rales.  Abdominal: Soft. There is no tenderness. There is no rebound, no guarding and no CVA tenderness.  Musculoskeletal: He exhibits no edema.  Neurological: He is alert and oriented to person, place, and time.  Skin: Skin is warm and dry.  Nursing note and vitals reviewed.   ED Course  Procedures (including critical care time) Labs Review Labs Reviewed  COMPREHENSIVE METABOLIC PANEL - Abnormal; Notable for the following:    Sodium 134 (*)    CO2 20 (*)    Glucose, Bld 302 (*)    BUN 22 (*)    Calcium 7.9 (*)    Albumin 2.5 (*)    Alkaline Phosphatase 260 (*)    GFR calc non Af Amer 59 (*)    All other components within normal limits  CBC WITH DIFFERENTIAL/PLATELET - Abnormal; Notable for the following:    RBC 2.70 (*)    Hemoglobin  7.4 (*)    HCT 23.4 (*)    RDW 16.2 (*)    Platelets 415 (*)    Neutrophils Relative % 79 (*)    All other components within normal limits  URINALYSIS, ROUTINE W REFLEX MICROSCOPIC - Abnormal; Notable for the following:    Glucose, UA 500 (*)    All other components within normal limits  RETICULOCYTES - Abnormal; Notable for the following:    RBC. 2.67 (*)    All other components within normal limits  LIPASE, BLOOD  TROPONIN I  VITAMIN B12  FOLATE  IRON AND TIBC  FERRITIN  POC OCCULT BLOOD, ED  PREPARE RBC (CROSSMATCH)  Imaging Review Dg Chest 2 View  11/09/2014   CLINICAL DATA:  Lower chest pain, generalized abdominal pain, prostate cancer  EXAM: CHEST  2 VIEW  COMPARISON:  03/09/2011  FINDINGS: Lungs are clear.  No pleural effusion or pneumothorax.  The heart is normal in size.  Visualized osseous structures are within normal limits.  IMPRESSION: No evidence of acute cardiopulmonary disease.   Electronically Signed   By: Julian Hy M.D.   On: 11/09/2014 12:59   Ct Renal Stone Study  11/09/2014   CLINICAL DATA:  Generalized abdominal pain in lower posterior chest pain since Thursday. No nausea, vomiting, diarrhea. Lower back pain yesterday. History of prostate cancer.  EXAM: CT ABDOMEN AND PELVIS WITHOUT CONTRAST  TECHNIQUE: Multidetector CT imaging of the abdomen and pelvis was performed following the standard protocol without IV contrast.  COMPARISON:  Bone scan 10/29/2014, and pelvis 10/29/2014  FINDINGS: Lower chest: Minimal bibasilar atelectasis. Coronary artery calcifications are present. Heart size is normal. No pericardial effusion.  Upper abdomen: There are low-attenuation lesions within the kidneys, consistent with cyst based on density measurements. No focal abnormality identified within the liver, pancreas, or spleen. The gallbladder is present.  Gastrointestinal tract: Stomach and small bowel loops are normal in appearance. The appendix is well seen and has a normal  appearance. There is significant stool burden.  Pelvis: Urinary bladder has a normal appearance. Prostate gland is normal in size. No pelvic adenopathy.  Retroperitoneum: No retroperitoneal or mesenteric adenopathy. No evidence for aortic aneurysm.  For  Abdominal wall: There are focal areas of hazy density in the anterior abdominal subcutaneous fat, of uncertain significance. Has the patient had recent injections? Ecchymosis or cellulitis could have a similar appearance. Fat containing umbilical hernia is noted.  Osseous structures: Numerous blastic lesions are identified throughout the lower thoracic and lumbar spine consistent with blastic metastases. The left iliac wing is diffusely sclerotic, consistent with metastasis. No acute fracture.  IMPRESSION: 1. Coronary artery disease. 2. Bilateral renal cyst. No hydronephrosis. No intrarenal or ureteral stones. 3. Significant stool burden. 4. Normal appendix. 5. Hazy appearance of the anterior abdominal wall subcutaneous fat. See above. 6. Numerous blastic metastases.   Electronically Signed   By: Nolon Nations M.D.   On: 11/09/2014 13:10     EKG Interpretation   Date/Time:  Sunday Nov 09 2014 11:12:43 EDT Ventricular Rate:  111 PR Interval:  183 QRS Duration: 82 QT Interval:  349 QTC Calculation: 474 R Axis:   15 Text Interpretation:  Sinus tachycardia Atrial premature complexes  Nonspecific T wave abnormality No old tracing to compare Confirmed by  [place] ([id number]) on 11/09/2014 11:16:25 AM      MDM   Final diagnoses:  Anemia, unspecified anemia type    Unclear where patient's anemia is coming from. No history of melena or bloody stools. Occult blood negative on stool here. He is hemodynamically stable, and in no distress. Has no current symptoms. Given his left-sided pain a CT was obtained to rule out renal colic and evaluate caliber of aorta. No AAA. CT brings up possible abnormality to the anterior abdominal wall but on  reexamination there is no warmth, ecchymosis, or acute abnormality. Given his intermittent chest pain with nonspecific T waves as well as anemia, he will need admission for further workup of his chest pain and anemia. Discussed with the hospitalist, who requests a 1 unit of packed red blood cell transfusion. Patient has given verbal consent for this.  Sherwood Gambler, MD 11/09/14 1534

## 2014-11-09 NOTE — ED Notes (Signed)
Patient transported to CT 

## 2014-11-09 NOTE — ED Notes (Signed)
Awake. Verbally responsive. A/O x4. Resp even and unlabored. No audible adventitious breath sounds noted. ABC's intact. SR on monitor. IV saline lock patent and intact. 

## 2014-11-09 NOTE — ED Notes (Signed)
Pt says that he is unable to urinate at this time

## 2014-11-09 NOTE — H&P (Signed)
[facility] History and Physical  Jeremy Jennings WCB:[id number] DOB: [birth year] DOA: 11/09/2014  Referring physician: ER physician Dr. Sherwood Jennings PCP: Jeremy Danes, Jeremy Jennings  Oncology: Dr. Zola Jennings GU: Dr. Cristela Jennings  Endo: Dr. Elayne Jennings   Chief Complaint: abdominal pain, chest pain  HPI:  79 year old male with history of prostate cancer (follosw with Dr. Junious Jennings of GU and Dr. Alen Jennings of oncology). Hypertension, diabetes who presented to [facility] ED with ongoing abdominal pain in the upper abd area and chest pain in the mid sternal area since past 4 days prior to this admission. Abdominal pain is rated 7/10 in intensity, sharp, intermittent, radiating to lower abdomen, present at rest and with movement, no specific alleviating or aggravating factors. He was seen by Dr. Tammi Jennings about few days ago for possible RT but was determined that RT not indicated at this time since no skeletal lesions evident to radiate. He was advised to follow up with Dr. Alen Jennings. In regards to chest pain, it is intermittent, 7/10 in intensity when at worst, present at rest and with movement, nonradiating. No associated fevers, chills. No palpitations or shortness of breath.  In ED, patient is hemodynamically stable. Blood work is significant for hemoglobin of 7.4 (also note no other CBC available in Epic for some reason for comparison), creatinine was within normal limits. Troponin level was within normal limits. The 12th lead EKG showed sinus tachycardia. Chest x-ray showed no acute cardiopulmonary disease. CT renal stone protocol revealed bilateral renal cysts but no hydronephrosis or ureteral stones, numerous blastic metastasis. Blood transfusion started in ED. Patient admitted for further evaluation of anemia and chest pain.   Assessment & Plan    Principal problem: Abdominal pain - Unclear etiology of abdominal pain. Possible radiating pain from blastic metastasis. - If pain persists, perhaps dedicated  CT scan would be better than the renal stone study. - Continue supportive care with IV fluids, analgesia as needed.  Active problems: Chest pain - Unclear etiology, ? Blastic lesions. The troponin level was within normal limits. At the 12-lead EKG showed sinus tachycardia. - Cycle cardiac enzymes - Continue supportive care with analgesia as needed.  Anemia of chronic disease / acute blood loss anemia - Anemia likely secondary to history of prostate cancer. Hemoglobin on admission 7.4. - Patient is receiving 1 unit of PRBC. Follow-up post transfusion hemoglobin.  Diabetes, uncontrolled without complications - Last O1Y in December 2014 8.4 indicating less than goal A1c for diabetic patient. - Continue current insulin regimen, Levemir 12 units at bedtime, metformin, Actos, Linaglutide as well as SSI - Pt follows with Dr. Elayne Jennings and has been on multiple antihyperglycemic medications for quite some time.   Essential hypertension - Continue Texturna, Coreg, Cardizem, HCTZ  Dyslipidemia - Continue Lipitor daily.   DVT prophylaxis:  - SCD's bilaterally due to anemia and risk of bleed   Radiological Exams on Admission: Dg Chest 2 View 11/09/2014  No evidence of acute cardiopulmonary disease.   Electronically Signed   By: Jeremy Jennings M.D.   On: 11/09/2014 12:59   Ct Renal Stone Study 11/09/2014  1. Coronary artery disease. 2. Bilateral renal cyst. No hydronephrosis. No intrarenal or ureteral stones. 3. Significant stool burden. 4. Normal appendix. 5. Hazy appearance of the anterior abdominal wall subcutaneous fat. See above. 6. Numerous blastic metastases.   Electronically Signed   By: Jeremy Jennings M.D.   On: 11/09/2014 13:10    EKG: I have personally reviewed EKG. EKG shows sinus  tachycardia   Code Status: Full Family Communication: Plan of care discussed with the patient  Disposition Plan: Admit for further evaluation, telemetry   Jeremy Fabela, Jeremy Jennings  [facility] Pager  [phone]  Time spent in minutes: 75 minutes  Review of Systems:  Constitutional: Negative for fever, chills and malaise/fatigue. Negative for diaphoresis.  HENT: Negative for hearing loss, ear pain, nosebleeds, congestion, sore throat, neck pain, tinnitus and ear discharge.   Eyes: Negative for blurred vision, double vision, photophobia, pain, discharge and redness.  Respiratory: Negative for cough, hemoptysis, sputum production, shortness of breath, wheezing and stridor.   Cardiovascular: positive for chest pain, no palpitations, orthopnea, claudication and leg swelling.  Gastrointestinal: Negative for heartburn, constipation, blood in stool and melena.  Genitourinary: Negative for dysuria, urgency, frequency, hematuria and flank pain.  Musculoskeletal: Negative for myalgias, back pain, joint pain and falls.  Skin: Negative for itching and rash.  Neurological: Negative for dizziness and weakness. Negative for tingling, tremors, sensory change, speech change, focal weakness, loss of consciousness and headaches.  Endo/Heme/Allergies: Negative for environmental allergies and polydipsia. Does not bruise/bleed easily.  Psychiatric/Behavioral: Negative for suicidal ideas. The patient is not nervous/anxious.      Past Medical History  Diagnosis Date  . Hypertension   . Diabetes mellitus   . Heart murmur     SYSTOLIC MURMUR AT BASE  . Hyperlipidemia   . Prostate cancer    Past Surgical History  Procedure Laterality Date  . Tonsillectomy      IN [facility]  . Hemorroidectomy  1969  . Hernia repair  AFE 16-17  . Hernia repair  1981    [place]   Social History:  reports that he quit smoking about 35 years ago. His smoking use included Cigarettes. He has a 5 pack-year smoking history. He has never used smokeless tobacco. He reports that he does not drink alcohol or use illicit drugs.  No Known Allergies  Family History:  Family History  Problem Relation Age of Onset  . Hypertension   Parents       Prior to Admission medications   Medication Sig Start Date End Date Taking? Authorizing Provider  ACCU-CHEK SOFTCLIX LANCETS lancets Use as instructed to check blood sugar 2 times per day dx code E11.65 10/23/14  Yes Jeremy Snare, Jeremy Jennings  Aspirin-Caffeine 400-32 MG TABS Take 2 tablets by mouth.   Yes Historical Provider, Jeremy Jennings  atorvastatin (LIPITOR) 10 MG tablet take 1 tablet by mouth once daily 07/09/14  Yes Darlin Coco, Jeremy Jennings  Blood Glucose Monitoring Suppl (ACCU-CHEK AVIVA PLUS) W/DEVICE KIT Use to check blood sugar 2 times per day dx code E11.65 10/23/14  Yes Jeremy Snare, Jeremy Jennings  carvedilol (COREG) 12.5 MG tablet Take 1 tablet (12.5 mg total) by mouth 2 (two) times daily with a meal. 08/19/14  Yes Darlin Coco, Jeremy Jennings  Cholecalciferol (VITAMIN D PO) Take by mouth daily. Taking 5000 daily   Yes Historical Provider, Jeremy Jennings  COMBIGAN 0.2-0.5 % ophthalmic solution  04/09/13  Yes Historical Provider, Jeremy Jennings  diltiazem (TIAZAC) 360 MG 24 hr capsule Take 360 mg by mouth daily.     Yes Historical Provider, Jeremy Jennings  glucose blood (ACCU-CHEK AVIVA PLUS) test strip Use as instructed to check blood sugar 2 times per day dx code E11.65 10/23/14  Yes Jeremy Snare, Jeremy Jennings  ibuprofen (ADVIL,MOTRIN) 100 MG tablet Take 400 mg by mouth 2 (two) times daily.   Yes Historical Provider, Jeremy Jennings  insulin aspart (NOVOLOG FLEXPEN) 100 UNIT/ML FlexPen Inject 9-10 units 3  times per day Patient taking differently: Inject 12 units 3 times per day with meals 06/09/14  Yes Jeremy Snare, Jeremy Jennings  Insulin Detemir (LEVEMIR FLEXTOUCH) 100 UNIT/ML Pen Inject 10 Units into the skin daily at 10 pm. Patient taking differently: Inject 12 Units into the skin daily at 10 pm.  10/06/14  Yes Jeremy Snare, Jeremy Jennings  Insulin Pen Needle (B-D UF III MINI PEN NEEDLES) 31G X 5 MM MISC 1 each by Does not apply route 2 (two) times daily. 04/16/14  Yes Jeremy Snare, Jeremy Jennings  latanoprost (XALATAN) 0.005 % ophthalmic solution Place 0.005 % into the left eye as directed.  07/02/11  Yes Historical  Provider, Jeremy Jennings  Liraglutide 18 MG/3ML SOPN Inject 1.8 mg into the skin daily. 06/09/14  Yes Jeremy Snare, Jeremy Jennings  megestrol (MEGACE) 20 MG tablet Take 20 mg by mouth 2 (two) times daily.   Yes Historical Provider, Jeremy Jennings  metFORMIN (GLUCOPHAGE) 500 MG tablet Take 1 tablet (500 mg total) by mouth 3 (three) times daily. 10/06/14  Yes Jeremy Snare, Jeremy Jennings  pioglitazone (ACTOS) 30 MG tablet Take 1 tablet (30 mg total) by mouth daily. 07/10/14  Yes Jeremy Snare, Jeremy Jennings  prednisoLONE acetate (PRED FORTE) 1 % ophthalmic suspension Place into both eyes 2 (two) times daily.  04/25/13  Yes Historical Provider, Jeremy Jennings  tamsulosin (FLOMAX) 0.4 MG CAPS capsule  08/02/13  Yes Historical Provider, Jeremy Jennings  TEKTURNA HCT 150-12.5 MG TABS Take 12.5 mg by mouth daily. 10/16/14  Yes Jeremy Snare, Jeremy Jennings   Physical Exam: Filed Vitals:   11/09/14 1112 11/09/14 1152 11/09/14 1300  BP: 154/72  137/61  Pulse: 105  96  Temp: 97.6 F (36.4 C)    TempSrc: Oral    Resp: 16  18  SpO2: 99% 99% 98%    Physical Exam  Constitutional: Appears well-developed and well-nourished. No distress.  HENT: Normocephalic. No tonsillar erythema or exudates Eyes: Conjunctivae and EOM are normal. PERRLA, no scleral icterus.  Neck: Normal ROM. Neck supple. No JVD. No tracheal deviation. No thyromegaly.  CVS: RRR, S1/S2 +, no murmurs, no gallops, no carotid bruit.  Pulmonary: Effort and breath sounds normal, no stridor, rhonchi, wheezes, rales.  Abdominal: Soft. BS +,  no distension, tenderness, rebound or guarding.  Musculoskeletal: Normal range of motion. No edema and no tenderness.  Lymphadenopathy: No lymphadenopathy noted, cervical, inguinal. Neuro: Alert. Normal reflexes, muscle tone coordination. No focal neurologic deficits. Skin: Skin is warm and dry. No rash noted.  No erythema. No pallor.  Psychiatric: Normal mood and affect. Behavior, judgment, thought content normal.   Labs on Admission:  Basic Metabolic Panel:  Recent Labs Lab 11/09/14 1137  NA 134*  K  4.6  CL 106  CO2 20*  GLUCOSE 302*  BUN 22*  CREATININE 1.16  CALCIUM 7.9*   Liver Function Tests:  Recent Labs Lab 11/09/14 1137  AST 16  ALT 19  ALKPHOS 260*  BILITOT 0.6  PROT 7.0  ALBUMIN 2.5*    Recent Labs Lab 11/09/14 1137  LIPASE 46   No results for input(s): AMMONIA in the last 168 hours. CBC:  Recent Labs Lab 11/09/14 1137  WBC 6.9  NEUTROABS 5.5  HGB 7.4*  HCT 23.4*  MCV 86.7  PLT 415*   Cardiac Enzymes:  Recent Labs Lab 11/09/14 1137  TROPONINI <0.03   BNP: Invalid input(s): POCBNP CBG: No results for input(s): GLUCAP in the last 168 hours.  If 7PM-7AM, please contact night-coverage [email or web address] Password [email or web address] 11/09/2014, 2:02 PM

## 2014-11-09 NOTE — ED Notes (Signed)
Pt reports generalized abd pain and lower posterior chest pain since Thursday. No n/v/d. LBM yesterday. Pt has prostate ca, not on chemo.

## 2014-11-09 NOTE — ED Notes (Signed)
Pt reported having pain in thigh and moved to lt abd area. No n/v/d. Abd soft/nondistended but tender to touch. BS (+) and hyperactive active x4 quadrants.

## 2014-11-09 NOTE — ED Notes (Addendum)
Awake. Verbally responsive. Resp even and unlabored. No audible adventitious breath sounds noted. ABC's intact. Abd soft/nondistended but tender to palpate. BS (+) and active x4 quadrants. No N/V/D reported. IV saline lock patent and intact. 

## 2014-11-09 NOTE — ED Notes (Signed)
Bed: WA06 Expected date:  Expected time:  Means of arrival:  Comments: 

## 2014-11-10 ENCOUNTER — Telehealth: Payer: Self-pay | Admitting: Cardiology

## 2014-11-10 DIAGNOSIS — C61 Malignant neoplasm of prostate: Secondary | ICD-10-CM | POA: Diagnosis not present

## 2014-11-10 DIAGNOSIS — E119 Type 2 diabetes mellitus without complications: Secondary | ICD-10-CM | POA: Diagnosis not present

## 2014-11-10 DIAGNOSIS — D631 Anemia in chronic kidney disease: Secondary | ICD-10-CM | POA: Diagnosis not present

## 2014-11-10 DIAGNOSIS — N189 Chronic kidney disease, unspecified: Secondary | ICD-10-CM

## 2014-11-10 DIAGNOSIS — D6489 Other specified anemias: Secondary | ICD-10-CM | POA: Diagnosis not present

## 2014-11-10 LAB — COMPREHENSIVE METABOLIC PANEL
ALT: 17 U/L (ref 17–63)
ANION GAP: 11 (ref 5–15)
AST: 14 U/L — ABNORMAL LOW (ref 15–41)
Albumin: 2.3 g/dL — ABNORMAL LOW (ref 3.5–5.0)
Alkaline Phosphatase: 236 U/L — ABNORMAL HIGH (ref 38–126)
BILIRUBIN TOTAL: 0.5 mg/dL (ref 0.3–1.2)
BUN: 18 mg/dL (ref 6–20)
CALCIUM: 7.8 mg/dL — AB (ref 8.9–10.3)
CO2: 22 mmol/L (ref 22–32)
CREATININE: 0.94 mg/dL (ref 0.61–1.24)
Chloride: 104 mmol/L (ref 101–111)
GFR calc Af Amer: >60 mL/min (ref >60)
GFR calc non Af Amer: >60 mL/min (ref >60)
Glucose, Bld: 161 mg/dL — ABNORMAL HIGH (ref 70–99)
Potassium: 4.2 mmol/L (ref 3.5–5.1)
Sodium: 137 mmol/L (ref 135–145)
TOTAL PROTEIN: 6.4 g/dL — AB (ref 6.5–8.1)

## 2014-11-10 LAB — GLUCOSE, CAPILLARY
Glucose-Capillary: 116 mg/dL — ABNORMAL HIGH (ref 70–99)
Glucose-Capillary: 239 mg/dL — ABNORMAL HIGH (ref 70–99)
Glucose-Capillary: 166 mg/dL — ABNORMAL HIGH (ref 70–99)
Glucose-Capillary: 272 mg/dL — ABNORMAL HIGH (ref 70–99)

## 2014-11-10 LAB — CBC
HEMATOCRIT: 24.9 % — AB (ref 39.0–52.0)
Hemoglobin: 7.9 g/dL — ABNORMAL LOW (ref 13.0–17.0)
MCH: 27.3 pg (ref 26.0–34.0)
MCHC: 31.7 g/dL (ref 30.0–36.0)
MCV: 86.2 fL (ref 78.0–100.0)
Platelets: 395 10*3/uL (ref 150–400)
RBC: 2.89 MIL/uL — AB (ref 4.22–5.81)
RDW: 15.6 % — ABNORMAL HIGH (ref 11.5–15.5)
WBC: 7 10*3/uL (ref 4.0–10.5)

## 2014-11-10 LAB — PREPARE RBC (CROSSMATCH)

## 2014-11-10 LAB — TROPONIN I: Troponin I: <0.03 ng/mL (ref <0.031)

## 2014-11-10 MED ORDER — SENNOSIDES-DOCUSATE SODIUM 8.6-50 MG PO TABS
2.0000 | ORAL_TABLET | Freq: Two times a day (BID) | ORAL | Status: DC
  Administered 2014-11-10 – 2014-11-11 (×2): 2 via ORAL
  Filled 2014-11-10 (×4): qty 2

## 2014-11-10 MED ORDER — OXYCODONE HCL 5 MG PO TABS
5.0000 mg | ORAL_TABLET | ORAL | Status: DC | PRN
  Administered 2014-11-10 – 2014-11-11 (×2): 5 mg via ORAL
  Filled 2014-11-10 (×2): qty 1

## 2014-11-10 MED ORDER — POLYETHYLENE GLYCOL 3350 17 G PO PACK
17.0000 g | PACK | Freq: Two times a day (BID) | ORAL | Status: DC
  Administered 2014-11-10 – 2014-11-11 (×3): 17 g via ORAL
  Filled 2014-11-10 (×4): qty 1

## 2014-11-10 MED ORDER — BISACODYL 5 MG PO TBEC
10.0000 mg | DELAYED_RELEASE_TABLET | Freq: Once | ORAL | Status: AC
  Administered 2014-11-10: 10 mg via ORAL
  Filled 2014-11-10: qty 2

## 2014-11-10 MED ORDER — SODIUM CHLORIDE 0.9 % IV SOLN: Freq: Once | INTRAVENOUS | Status: DC

## 2014-11-10 NOTE — Telephone Encounter (Signed)
Spoke with Dr Netta Cedars and advised no recent Hemoglobin done

## 2014-11-10 NOTE — Care Management Note (Signed)
Case Management Note  Patient Details  Name: TANIELA FELTUS MRN: [id number] Date of Birth: Feb 10, 1936  Subjective/Objective:   Patient stated that he lives at home alone and has no home needs.                 Action/Plan:   Expected Discharge Date:  11/16/14               Expected Discharge Plan:  Home/Self Care  In-House Referral:  NA  Discharge planning Services  NA  Post Acute Care Choice:  NA Choice offered to:  NA  DME Arranged:    DME Agency:     HH Arranged:    HH Agency:     Status of Service:  Completed, signed off  Medicare Important Message Given:    Date Medicare IM Given:    Medicare IM give by:    Date Additional Medicare IM Given:    Additional Medicare Important Message give by:     If discussed at [place] of Stay Meetings, dates discussed:    Additional Comments:  Scot Dock, RN 11/10/2014, 4:12 PM

## 2014-11-10 NOTE — Progress Notes (Signed)
Patient Demographics  Jeremy Jennings, is a 79 y.o. male, DOB - Apr 05, 1936, JME:[id number]  Admit date - 11/09/2014   Admitting Physician Robbie Lis, MD  Outpatient Primary MD for the patient is [name], MD  LOS - 1   Chief Complaint  Patient presents with  . Abdominal Pain  . Chest Pain         Subjective:   Jeremy Jennings today has, No headache, No chest pain, No abdominal pain - No Nausea, No new weakness tingling or numbness, No Cough - SOB. Complains of pain all over.  Assessment & Plan    Active Problems:   Anemia  Abdominal pain - Most likely secondary to radiating pain from blastic metastasis. - Benign physical exam - Continue supportive care with IV fluids, analgesia as needed.   Chest pain - Appears to be muscular skeletal pain secondary to Blastic lesions. 12-lead EKG nonacute. - Cycle cardiac enzymes negative 3 - Continue supportive care with analgesia as needed.  Anemia of chronic disease / acute blood loss anemia - Most likely anemia of chronic disease, and bone marrow involvement of metastasis of prostate cancer. - Patient PCP, urologist, no CBC available to compare in the past. - Hemoccult negative - Will transfuse another unit packed red blood cell.  Metastatic prostate cancer - Has a follow-up appointment with Dr Osker Mason on Friday. - at this point no indication for radiation by radiation oncology - Discussed with urology  Diabetes, uncontrolled without complications - Last I2L in December 2014 8.4 indicating less than goal A1c for diabetic patient. - Continue current insulin regimen, Levemir 12 units at bedtime, metformin, Actos, Linaglutide as well as SSI  Essential hypertension - Continue Texturna, Coreg, Cardizem, HCTZ  Dyslipidemia - Continue Lipitor daily.   DVT prophylaxis:  - SCD's bilaterally due to  anemia and risk of bleed   Radiological Exams on Admission: Dg Chest 2 View 11/09/2014 No evidence of acute cardiopulmonary disease. Electronically Signed By: Julian Hy M.D. On: 11/09/2014 12:59   Ct Renal Stone Study 11/09/2014 1. Coronary artery disease. 2. Bilateral renal cyst. No hydronephrosis. No intrarenal or ureteral stones. 3. Significant stool burden. 4. Normal appendix. 5. Hazy appearance of the anterior abdominal wall subcutaneous fat. See above. 6. Numerous blastic metastases. Electronically Signed By: Nolon Nations M.D. On: 11/09/2014 13:10   Code Status: Full  Family Communication: none at bedside  Disposition Plan: home when stable   Procedures  1 unit PRBC    Consults   none   Medications  Scheduled Meds: . aliskiren  150 mg Oral Daily   And  . hydrochlorothiazide  12.5 mg Oral Daily  . atorvastatin  10 mg Oral Daily  . brimonidine  1 drop Both Eyes BID   And  . timolol  1 drop Both Eyes BID  . carvedilol  12.5 mg Oral BID WC  . diltiazem  360 mg Oral Daily  . insulin aspart  0-15 Units Subcutaneous TID WC  . insulin detemir  12 Units Subcutaneous QHS  . latanoprost  1 drop Left Eye QHS  . Liraglutide  1.8 mg Subcutaneous Daily  . megestrol  20 mg Oral BID  . metFORMIN  500 mg Oral TID WC  . pioglitazone  30  mg Oral Daily  . polyethylene glycol  17 g Oral BID  . prednisoLONE acetate  1 drop Both Eyes BID  . senna-docusate  2 tablet Oral BID  . tamsulosin  0.4 mg Oral Daily   Continuous Infusions: . sodium chloride Stopped (11/10/14 1014)   PRN Meds:.acetaminophen **OR** acetaminophen, ondansetron **OR** ondansetron (ZOFRAN) IV, oxyCODONE  DVT Prophylaxis  - SCDs   Lab Results  Component Value Date   PLT 395 11/10/2014    Antibiotics    Anti-infectives    None          Objective:   Filed Vitals:   11/09/14 1905 11/09/14 2205 11/10/14 0602 11/10/14 1447  BP: 117/48 120/56 137/62 126/61  Pulse: 94 91 91 82    Temp: 98 F (36.7 C) 98 F (36.7 C) 97.5 F (36.4 C) 98.5 F (36.9 C)  TempSrc: Oral Oral Oral Oral  Resp: 18 16 18 20   Height:      Weight:   98.5 kg (217 lb 2.5 oz)   SpO2: 100% 98% 99% 99%    Wt Readings from Last 3 Encounters:  11/10/14 98.5 kg (217 lb 2.5 oz)  09/25/14 101.152 kg (223 lb)  08/19/14 101.334 kg (223 lb 6.4 oz)     Intake/Output Summary (Last 24 hours) at 11/10/14 1741 Last data filed at 11/10/14 1500  Gross per 24 hour  Intake   2530 ml  Output      0 ml  Net   2530 ml     Physical Exam  Awake Alert, Oriented X 3, No new F.N deficits, Normal affect [place].AT,PERRAL Supple Neck,No JVD, No cervical lymphadenopathy appriciated.  Symmetrical Chest wall movement, Good air movement bilaterally, CTAB, chest tender to palpation. RRR,No Gallops,Rubs or new Murmurs, No Parasternal Heave +ve B.Sounds, Abd Soft, No tenderness, No organomegaly appriciated, No rebound - guarding or rigidity. No Cyanosis, Clubbing or edema, No new Rash or bruise    Data Review   Micro Results No results found for this or any previous visit (from the past 240 hour(s)).  Radiology Reports Dg Chest 2 View  11/09/2014   CLINICAL DATA:  Lower chest pain, generalized abdominal pain, prostate cancer  EXAM: CHEST  2 VIEW  COMPARISON:  03/09/2011  FINDINGS: Lungs are clear.  No pleural effusion or pneumothorax.  The heart is normal in size.  Visualized osseous structures are within normal limits.  IMPRESSION: No evidence of acute cardiopulmonary disease.   Electronically Signed   By: Julian Hy M.D.   On: 11/09/2014 12:59   Ct Renal Stone Study  11/09/2014   CLINICAL DATA:  Generalized abdominal pain in lower posterior chest pain since Thursday. No nausea, vomiting, diarrhea. Lower back pain yesterday. History of prostate cancer.  EXAM: CT ABDOMEN AND PELVIS WITHOUT CONTRAST  TECHNIQUE: Multidetector CT imaging of the abdomen and pelvis was performed following the standard protocol  without IV contrast.  COMPARISON:  Bone scan 10/29/2014, and pelvis 10/29/2014  FINDINGS: Lower chest: Minimal bibasilar atelectasis. Coronary artery calcifications are present. Heart size is normal. No pericardial effusion.  Upper abdomen: There are low-attenuation lesions within the kidneys, consistent with cyst based on density measurements. No focal abnormality identified within the liver, pancreas, or spleen. The gallbladder is present.  Gastrointestinal tract: Stomach and small bowel loops are normal in appearance. The appendix is well seen and has a normal appearance. There is significant stool burden.  Pelvis: Urinary bladder has a normal appearance. Prostate gland is normal in size. No pelvic  adenopathy.  Retroperitoneum: No retroperitoneal or mesenteric adenopathy. No evidence for aortic aneurysm.  For  Abdominal wall: There are focal areas of hazy density in the anterior abdominal subcutaneous fat, of uncertain significance. Has the patient had recent injections? Ecchymosis or cellulitis could have a similar appearance. Fat containing umbilical hernia is noted.  Osseous structures: Numerous blastic lesions are identified throughout the lower thoracic and lumbar spine consistent with blastic metastases. The left iliac wing is diffusely sclerotic, consistent with metastasis. No acute fracture.  IMPRESSION: 1. Coronary artery disease. 2. Bilateral renal cyst. No hydronephrosis. No intrarenal or ureteral stones. 3. Significant stool burden. 4. Normal appendix. 5. Hazy appearance of the anterior abdominal wall subcutaneous fat. See above. 6. Numerous blastic metastases.   Electronically Signed   By: Nolon Nations M.D.   On: 11/09/2014 13:10    CBC  Recent Labs Lab 11/09/14 1137 11/09/14 1639 11/10/14 0508  WBC 6.9 6.3 7.0  HGB 7.4* 7.2* 7.9*  HCT 23.4* 23.1* 24.9*  PLT 415* 379 395  MCV 86.7 86.2 86.2  MCH 27.4 26.9 27.3  MCHC 31.6 31.2 31.7  RDW 16.2* 16.0* 15.6*  LYMPHSABS 0.8 1.0  --     MONOABS 0.6 0.7  --   EOSABS 0.1 0.1  --   BASOSABS 0.0 0.0  --     Chemistries   Recent Labs Lab 11/09/14 1137 11/09/14 1639 11/10/14 0508  NA 134* 135 137  K 4.6 3.9 4.2  CL 106 105 104  CO2 20* 23 22  GLUCOSE 302* 217* 161*  BUN 22* 19 18  CREATININE 1.16 0.94 0.94  CALCIUM 7.9* 7.9* 7.8*  MG  --  2.3  --   AST 16 13* 14*  ALT 19 19 17   ALKPHOS 260* 254* 236*  BILITOT 0.6 0.3 0.5   ------------------------------------------------------------------------------------------------------------------ estimated creatinine clearance is 80 mL/min (by C-G formula based on Cr of 0.94). ------------------------------------------------------------------------------------------------------------------ No results for input(s): HGBA1C in the last 72 hours. ------------------------------------------------------------------------------------------------------------------ No results for input(s): CHOL, HDL, LDLCALC, TRIG, CHOLHDL, LDLDIRECT in the last 72 hours. ------------------------------------------------------------------------------------------------------------------  Recent Labs  11/09/14 1640  TSH 2.676   ------------------------------------------------------------------------------------------------------------------  Recent Labs  11/09/14 1416  VITAMINB12 930*  FOLATE 13.1  FERRITIN 575*  TIBC 203*  IRON 18*  RETICCTPCT 1.3    Coagulation profile  Recent Labs Lab 11/09/14 1639  INR 1.17    No results for input(s): DDIMER in the last 72 hours.  Cardiac Enzymes  Recent Labs Lab 11/09/14 1639 11/09/14 2255 11/10/14 0508  TROPONINI <0.03 <0.03 <0.03   ------------------------------------------------------------------------------------------------------------------ Invalid input(s): POCBNP     Time Spent in minutes   30 minutes   Kenesha Moshier M.D on 11/10/2014 at 5:41 PM  Between 7am to 7pm - Pager - [phone]  After 7pm go to  [email or web address] - password [email or web address]  And look for the night coverage person covering for me after hours  [facility] Office  [phone]   **Disclaimer: This note may have been dictated with voice recognition software. Similar sounding words can inadvertently be transcribed and this note may contain transcription errors which may not have been corrected upon publication of note.**

## 2014-11-10 NOTE — Telephone Encounter (Signed)
New Message  Pt in [place]. Dr. Netta Cedars calling to see if pt had any recent hemoglobin test at our office. Please call back and discuss.

## 2014-11-11 DIAGNOSIS — D6489 Other specified anemias: Secondary | ICD-10-CM

## 2014-11-11 LAB — GLUCOSE, CAPILLARY: GLUCOSE-CAPILLARY: 176 mg/dL — AB (ref 70–99)

## 2014-11-11 LAB — BASIC METABOLIC PANEL
ANION GAP: 9 (ref 5–15)
BUN: 25 mg/dL — ABNORMAL HIGH (ref 6–20)
CALCIUM: 7.7 mg/dL — AB (ref 8.9–10.3)
CHLORIDE: 105 mmol/L (ref 101–111)
CO2: 21 mmol/L — ABNORMAL LOW (ref 22–32)
CREATININE: 1.09 mg/dL (ref 0.61–1.24)
GFR calc Af Amer: >60 mL/min (ref >60)
GFR calc non Af Amer: >60 mL/min (ref >60)
GLUCOSE: 195 mg/dL — AB (ref 70–99)
Potassium: 4.2 mmol/L (ref 3.5–5.1)
Sodium: 135 mmol/L (ref 135–145)

## 2014-11-11 LAB — CBC
HCT: 26.7 % — ABNORMAL LOW (ref 39.0–52.0)
Hemoglobin: 8.7 g/dL — ABNORMAL LOW (ref 13.0–17.0)
MCH: 28.1 pg (ref 26.0–34.0)
MCHC: 32.6 g/dL (ref 30.0–36.0)
MCV: 86.1 fL (ref 78.0–100.0)
Platelets: 412 10*3/uL — ABNORMAL HIGH (ref 150–400)
RBC: 3.1 MIL/uL — ABNORMAL LOW (ref 4.22–5.81)
RDW: 16 % — ABNORMAL HIGH (ref 11.5–15.5)
WBC: 7.8 10*3/uL (ref 4.0–10.5)

## 2014-11-11 MED ORDER — FLEET ENEMA 7-19 GM/118ML RE ENEM
1.0000 | ENEMA | Freq: Once | RECTAL | Status: AC
  Administered 2014-11-11: 1 via RECTAL
  Filled 2014-11-11: qty 1

## 2014-11-11 MED ORDER — OXYCODONE-ACETAMINOPHEN 5-325 MG PO TABS: 1.0000 | ORAL_TABLET | ORAL | Status: DC | PRN

## 2014-11-11 NOTE — Progress Notes (Signed)
Inpatient Diabetes Program Recommendations  AACE/ADA: New Consensus Statement on Inpatient Glycemic Control (2013)  Target Ranges:  Prepandial:   less than 140 mg/dL      Peak postprandial:   less than 180 mg/dL (1-2 hours)      Critically ill patients:  140 - 180 mg/dL     Results for CLAYTON, BOSSERMAN (MRN [id number]) as of 11/11/2014 10:52  Ref. Range 11/10/2014 07:19 11/10/2014 11:29 11/10/2014 16:42 11/10/2014 21:24  Glucose-Capillary Latest Ref Range: 70-99 mg/dL 116 (H) 239 (H) 166 (H) 272 (H)     Current DM Orders: Levemir 12 units QHS            Novolog Moderate SSI tid            Victoza 1.8 mg daily            Metformin 500 mg tid            Actos 30 mg daily    **Patient having some issues with elevated postprandial glucose levels.  **Eating 75-100% of meals.  **At home, patient usually takes Novolog 12 units tid with meals    MD- Please consider adding 50% of patient's home dose of Novolog Meal Coverage-  Novolog 6 units tid with meals     Will follow Wyn Quaker RN, MSN, CDE Diabetes Coordinator Inpatient Diabetes Program Team Pager: [phone] (8a-5p)

## 2014-11-11 NOTE — Evaluation (Signed)
Physical Therapy Evaluation Patient Details Name: Jeremy Jennings MRN: [id number] DOB: 12/04/1935 Today's Date: 11/11/2014   History of Present Illness  79 year old male with history of prostate cancer. Hypertension, diabetes who presented to [facility] ED with ongoing abdominal pain in the upper abd area and chest pain in the mid sternal area since past 4 days prior to this admission.  Clinical Impression  Pt ambulated 300' independently, no LOB. He does not have any further PT needs since he is independent with mobility. No DME nor f/u PT needed. PT signing off.     Follow Up Recommendations No PT follow up    Equipment Recommendations  None recommended by PT    Recommendations for Other Services       Precautions / Restrictions Precautions Precautions: None Restrictions Weight Bearing Restrictions: No      Mobility  Bed Mobility Overal bed mobility: Independent                Transfers Overall transfer level: Independent                  Ambulation/Gait Ambulation/Gait assistance: Independent Ambulation Distance (Feet): 300 Feet Assistive device: None Gait Pattern/deviations: WFL(Within Functional Limits)   Gait velocity interpretation: at or above normal speed for age/gender General Gait Details: steady, no LOB  Stairs            Wheelchair Mobility    Modified Rankin (Stroke Patients Only)       Balance Overall balance assessment: No apparent balance deficits (not formally assessed)                                           Pertinent Vitals/Pain Pain Assessment: No/denies pain    Home Living Family/patient expects to be discharged to:: Private residence Living Arrangements: Alone           Home Layout: One level Home Equipment: Crutches      Prior Function Level of Independence: Independent with assistive device(s)         Comments: occaissionally uses crutch due to L knee arthritis pain     Hand  Dominance        Extremity/Trunk Assessment   Upper Extremity Assessment: Overall WFL for tasks assessed           Lower Extremity Assessment: Overall WFL for tasks assessed      Cervical / Trunk Assessment: Normal  Communication   Communication: No difficulties  Cognition Arousal/Alertness: Awake/alert Behavior During Therapy: WFL for tasks assessed/performed Overall Cognitive Status: Within Functional Limits for tasks assessed                      General Comments      Exercises        Assessment/Plan    PT Assessment Patent does not need any further PT services  PT Diagnosis     PT Problem List    PT Treatment Interventions     PT Goals (Current goals can be found in the Care Plan section) Acute Rehab PT Goals Patient Stated Goal: work in his garden, pt is retired Scientist, 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 [employer] PT Goal Formulation: All assessment and education complete, DC therapy    Frequency     Barriers to discharge        Co-evaluation  End of Session Equipment Utilized During Treatment: Gait belt Activity Tolerance: Patient tolerated treatment well Patient left: in chair;with call bell/phone within reach;with family/visitor present Nurse Communication: Mobility status    Functional Assessment Tool Used: clinical judgement Functional Limitation: Mobility: Walking and moving around Mobility: Walking and Moving Around Current Status [phone]): 0 percent impaired, limited or restricted Mobility: Walking and Moving Around Goal Status [phone]): 0 percent impaired, limited or restricted Mobility: Walking and Moving Around Discharge Status [phone]): 0 percent impaired, limited or restricted    Time: 3358-2518 PT Time Calculation (min) (ACUTE ONLY): 9 min   Charges:   PT Evaluation $Initial PT Evaluation Tier I: 1 Procedure     PT G Codes:   PT G-Codes **NOT FOR INPATIENT CLASS** Functional Assessment Tool Used: clinical  judgement Functional Limitation: Mobility: Walking and moving around Mobility: Walking and Moving Around Current Status (F8421): 0 percent impaired, limited or restricted Mobility: Walking and Moving Around Goal Status (I3128): 0 percent impaired, limited or restricted Mobility: Walking and Moving Around Discharge Status [phone]): 0 percent impaired, limited or restricted    Philomena Doheny 11/11/2014, 9:29 AM [phone]

## 2014-11-11 NOTE — Discharge Instructions (Signed)
Follow with Primary MD [name], MD in 7 days   Get CBC, CMP, 2 view Chest X ray checked  by Primary MD next visit.    Activity: As tolerated with Full fall precautions use walker/cane & assistance as needed   Disposition Home    Diet: Heart Healthy  , with feeding assistance and aspiration precautions.  For Heart failure patients - Check your Weight same time everyday, if you gain over 2 pounds, or you develop in leg swelling, experience more shortness of breath or chest pain, call your Primary MD immediately. Follow Cardiac Low Salt Diet and 1.5 lit/day fluid restriction.   On your next visit with your primary care physician please Get Medicines reviewed and adjusted.   Please request your Prim.MD to go over all Hospital Tests and Procedure/Radiological results at the follow up, please get all Hospital records sent to your Prim MD by signing hospital release before you go home.   If you experience worsening of your admission symptoms, develop shortness of breath, life threatening emergency, suicidal or homicidal thoughts you must seek medical attention immediately by calling 911 or calling your MD immediately  if symptoms less severe.  You Must read complete instructions/literature along with all the possible adverse reactions/side effects for all the Medicines you take and that have been prescribed to you. Take any new Medicines after you have completely understood and accpet all the possible adverse reactions/side effects.   Do not drive, operating heavy machinery, perform activities at heights, swimming or participation in water activities or provide baby sitting services if your were admitted for syncope or siezures until you have seen by Primary MD or a Neurologist and advised to do so again.  Do not drive when taking Pain medications.    Do not take more than prescribed Pain, Sleep and Anxiety Medications  Special Instructions: If you have smoked or chewed Tobacco  in  the last 2 yrs please stop smoking, stop any regular Alcohol  and or any Recreational drug use.  Wear Seat belts while driving.   Please note  You were cared for by a hospitalist during your hospital stay. If you have any questions about your discharge medications or the care you received while you were in the hospital after you are discharged, you can call the unit and asked to speak with the hospitalist on call if the hospitalist that took care of you is not available. Once you are discharged, your primary care physician will handle any further medical issues. Please note that NO REFILLS for any discharge medications will be authorized once you are discharged, as it is imperative that you return to your primary care physician (or establish a relationship with a primary care physician if you do not have one) for your aftercare needs so that they can reassess your need for medications and monitor your lab values.

## 2014-11-11 NOTE — Discharge Summary (Addendum)
Jeremy Jennings, 79 y.o., DOB [birth year], MRN [id number]. Admission date: 11/09/2014 Discharge Date 11/11/2014 Primary MD [name], MD Admitting Physician Robbie Lis, MD   PCP please follow on: - Check labs including CBC, BMP during next visit.  Admission Diagnosis  severe stomach and back pain  Discharge Diagnosis   Active Problems:   Hyperlipidemia   Hypertension   Prostate cancer   Anemia     Past Medical History  Diagnosis Date  . Hypertension   . Diabetes mellitus   . Heart murmur     SYSTOLIC MURMUR AT BASE  . Hyperlipidemia   . Prostate cancer     Past Surgical History  Procedure Laterality Date  . Tonsillectomy      IN [facility]  . Hemorroidectomy  1969  . Hernia repair  AFE 16-17  . Hernia repair  1981    [facility] Course See H&P, Labs, Consult and Test reports for all details in brief, patient was admitted for   Active Problems:   Hyperlipidemia   Hypertension   Prostate cancer   Anemia  History of present illness by admitting physician on 10/62. 80 year old male with history of prostate cancer (follosw with [name] of GU and Dr. Alen Blew of oncology). Hypertension, diabetes who presented to [facility] ED with ongoing abdominal pain in the upper abd area and chest pain in the mid sternal area since past 4 days prior to this admission. Abdominal pain is rated 7/10 in intensity, sharp, intermittent, radiating to lower abdomen, present at rest and with movement, no specific alleviating or aggravating factors. He was seen by Dr. Tammi Klippel about few days ago for possible RT but was determined that RT not indicated at this time since no skeletal lesions evident to radiate. He was advised to follow up with Dr. Alen Blew. In regards to chest pain, it is intermittent, 7/10 in intensity when at worst, present at rest and with movement, nonradiating. No associated fevers, chills. No palpitations or shortness of breath.  In ED, patient is hemodynamically stable. Blood  work is significant for hemoglobin of 7.4 (also note no other CBC available in Epic for some reason for comparison), creatinine was within normal limits. Troponin level was within normal limits. The 12th lead EKG showed sinus tachycardia. Chest x-ray showed no acute cardiopulmonary disease. CT renal stone protocol revealed bilateral renal cysts but no hydronephrosis or ureteral stones, numerous blastic metastasis. Blood transfusion started in ED. Patient admitted for further evaluation of anemia and chest pain.  Abdominal pain - Most likely secondary to radiating pain from blastic metastasis. - Benign abdomen on physical exam - Patient was started on Percocet on discharge  Chest pain - Appears to be muscular skeletal pain secondary to Blastic lesions. 12-lead EKG nonacute. - Cycle cardiac enzymes negative 3  Anemia of chronic disease / acute blood loss anemia - Most likely anemia of chronic disease, and bone marrow involvement of metastasis of prostate cancer. - Patient PCP, urologist, with no CBC available to compare in the past. - Hemoccult negative - Osseous total of units packed red blood cells, hemoglobin is 8.7 on discharge  Metastatic prostate cancer - Has a follow-up appointment with Dr Osker Mason on Friday. - at this point no indication for radiation by radiation oncology - Discussed with urology  Diabetes, uncontrolled without complications - Continue with home medication  Essential hypertension - Continue Texturna, Coreg, Cardizem, HCTZ  Dyslipidemia - Continue Lipitor daily.     Radiological  Exams on Admission: Dg Chest 2 View 11/09/2014 No evidence of acute cardiopulmonary disease. Electronically Signed By: Julian Hy M.D. On: 11/09/2014 12:59   Ct Renal Stone Study 11/09/2014 1. Coronary artery disease. 2. Bilateral renal cyst. No hydronephrosis. No intrarenal or ureteral stones. 3. Significant stool burden. 4. Normal appendix. 5. Hazy appearance of the  anterior abdominal wall subcutaneous fat. See above. 6. Numerous blastic metastases. Electronically Signed By: Nolon Nations M.D. On: 11/09/2014 13:10   Procedures - 2 units packed red blood cell transfusion during hospital stay  Consults  None  Significant Tests:  See full reports for all details    Dg Chest 2 View  11/09/2014   CLINICAL DATA:  Lower chest pain, generalized abdominal pain, prostate cancer  EXAM: CHEST  2 VIEW  COMPARISON:  03/09/2011  FINDINGS: Lungs are clear.  No pleural effusion or pneumothorax.  The heart is normal in size.  Visualized osseous structures are within normal limits.  IMPRESSION: No evidence of acute cardiopulmonary disease.   Electronically Signed   By: Julian Hy M.D.   On: 11/09/2014 12:59   Dg Pelvis 1-2 Views  10/29/2014   CLINICAL DATA:  Left hip pain, no known injury, prostate cancer  EXAM: PELVIS - 1-2 VIEW  COMPARISON:  04/30/2014 bone scan  FINDINGS: Single frontal view of the pelvis submitted. There is sclerosis in left acetabulum and left iliac bone highly suspicious for metastatic disease. Mild narrowing of superior left hip joint space. No hip subluxation. No pathologic fracture.  IMPRESSION: There is sclerosis in left acetabulum and left iliac bone highly suspicious for metastatic disease. Mild narrowing of superior left hip joint space. No subluxation. No pathologic fracture.   Electronically Signed   By: Lahoma Crocker M.D.   On: 10/29/2014 11:34   Nm Bone Scan Whole Body  10/29/2014   CLINICAL DATA:  History of metastatic prostate malignancy, elevated PSA, currently asymptomatic.  EXAM: NUCLEAR MEDICINE WHOLE BODY BONE SCAN  TECHNIQUE: Whole body anterior and posterior images were obtained approximately 3 hours after intravenous injection of radiopharmaceutical.  RADIOPHARMACEUTICALS:  26.3 mCi Technetium-99 MDP  COMPARISON:  Nuclear bone scan of June 30, 2014  FINDINGS: There is adequate uptake of the radiopharmaceutical by the  skeleton. Adequate soft tissue clearance and renal activity is present.  Calvarium: There is a punctate focus of increased uptake in the paramedian region of the occipital bone. There is minimally increased uptake in the left lateral parietal bone.  Spine: There are multiple foci of increased uptake within the cervical, thoracic, and lumbar spine. These are new since the previous study. The most significant uptake is noted an approximately T2, T10, and L1.  Ribs and sternum: There are multiple bilateral foci of increased uptake consistent with widespread metastases.  Pectoral girdle: There is a focus of increased uptake in the left proximal humeral shaft. There is also mildly increased uptake in the left humeral neck.  Pelvis and pelvic girdle: There is evidence of progressive disease involving the left hemipelvis including the left sacral ala and mid body of the sacrum. Abnormal activity in the ischium and pubic bones on the left is present and more conspicuous. There are at least 2 foci of increased uptake in the proximal and mid shaft of the right femur. There is mildly increased uptake in the intertrochanteric region and neck of the right femur.  COMPLICATIONS: Interval development of innumerable new widespread bony metastases as described with progressive changes in the left hemipelvis.   Electronically Signed  By: David  Martinique M.D.   On: 10/29/2014 14:27   Ct Renal Stone Study  11/09/2014   CLINICAL DATA:  Generalized abdominal pain in lower posterior chest pain since Thursday. No nausea, vomiting, diarrhea. Lower back pain yesterday. History of prostate cancer.  EXAM: CT ABDOMEN AND PELVIS WITHOUT CONTRAST  TECHNIQUE: Multidetector CT imaging of the abdomen and pelvis was performed following the standard protocol without IV contrast.  COMPARISON:  Bone scan 10/29/2014, and pelvis 10/29/2014  FINDINGS: Lower chest: Minimal bibasilar atelectasis. Coronary artery calcifications are present. Heart size is  normal. No pericardial effusion.  Upper abdomen: There are low-attenuation lesions within the kidneys, consistent with cyst based on density measurements. No focal abnormality identified within the liver, pancreas, or spleen. The gallbladder is present.  Gastrointestinal tract: Stomach and small bowel loops are normal in appearance. The appendix is well seen and has a normal appearance. There is significant stool burden.  Pelvis: Urinary bladder has a normal appearance. Prostate gland is normal in size. No pelvic adenopathy.  Retroperitoneum: No retroperitoneal or mesenteric adenopathy. No evidence for aortic aneurysm.  For  Abdominal wall: There are focal areas of hazy density in the anterior abdominal subcutaneous fat, of uncertain significance. Has the patient had recent injections? Ecchymosis or cellulitis could have a similar appearance. Fat containing umbilical hernia is noted.  Osseous structures: Numerous blastic lesions are identified throughout the lower thoracic and lumbar spine consistent with blastic metastases. The left iliac wing is diffusely sclerotic, consistent with metastasis. No acute fracture.  IMPRESSION: 1. Coronary artery disease. 2. Bilateral renal cyst. No hydronephrosis. No intrarenal or ureteral stones. 3. Significant stool burden. 4. Normal appendix. 5. Hazy appearance of the anterior abdominal wall subcutaneous fat. See above. 6. Numerous blastic metastases.   Electronically Signed   By: Nolon Nations M.D.   On: 11/09/2014 13:10     Today   Subjective:   Jeremy Jennings today has no headache,no chest abdominal pain,no new weakness tingling or numbness, feels much better wants to go home today.   Objective:   Blood pressure 140/59, pulse 84, temperature 97.9 F (36.6 C), temperature source Oral, resp. rate 16, height _0  (1.803 m), weight 100.3 kg (221 lb 1.9 oz), SpO2 99 %.  Intake/Output Summary (Last 24 hours) at 11/11/14 1204 Last data filed at 11/11/14 0830   Gross per 24 hour  Intake    920 ml  Output      0 ml  Net    920 ml    Exam Awake Alert, Oriented *3, No new F.N deficits, Normal affect [place].AT,PERRAL Supple Neck,No JVD, No cervical lymphadenopathy appriciated.  Symmetrical Chest wall movement, Good air movement bilaterally, CTAB RRR,No Gallops,Rubs or new Murmurs, No Parasternal Heave +ve B.Sounds, Abd Soft, Non tender, No organomegaly appriciated, No rebound -guarding or rigidity. No Cyanosis, Clubbing or edema, No new Rash or bruise  Data Review   Cultures -   CBC w Diff:  Lab Results  Component Value Date   WBC 7.8 11/11/2014   HGB 8.7* 11/11/2014   HCT 26.7* 11/11/2014   PLT 412* 11/11/2014   LYMPHOPCT 16 11/09/2014   MONOPCT 11 11/09/2014   EOSPCT 1 11/09/2014   BASOPCT 0 11/09/2014   CMP:  Lab Results  Component Value Date   NA 135 11/11/2014   K 4.2 11/11/2014   CL 105 11/11/2014   CO2 21* 11/11/2014   BUN 25* 11/11/2014   CREATININE 1.09 11/11/2014   PROT 6.4* 11/10/2014   ALBUMIN  2.3* 11/10/2014   BILITOT 0.5 11/10/2014   ALKPHOS 236* 11/10/2014   AST 14* 11/10/2014   ALT 17 11/10/2014  .  Micro Results No results found for this or any previous visit (from the past 240 hour(s)).   Discharge Instructions          Follow-up Information    Follow up with [name], MD. Schedule an appointment as soon as possible for a visit on 12/10/2014.   Specialty:  Cardiology   Why:  Please follow up with Dr. Mare Ferrari on Wednesday, June 1st at [facility] information:   [address] [place] [address] [phone]       Follow up with [facility], MD.   Specialty:  Oncology   Why:  Keep your appointment on 11/14/14   Contact information:   [address] [place] [address] [phone]       Discharge Medications     Medication List    STOP taking these medications        Aspirin-Caffeine 400-32 MG Tabs     ibuprofen 100 MG tablet  Commonly known as:   ADVIL,MOTRIN      TAKE these medications        ACCU-CHEK AVIVA PLUS W/DEVICE Kit  Use to check blood sugar 2 times per day dx code E11.65     ACCU-CHEK SOFTCLIX LANCETS lancets  Use as instructed to check blood sugar 2 times per day dx code E11.65     atorvastatin 10 MG tablet  Commonly known as:  LIPITOR  take 1 tablet by mouth once daily     carvedilol 12.5 MG tablet  Commonly known as:  COREG  Take 1 tablet (12.5 mg total) by mouth 2 (two) times daily with a meal.     COMBIGAN 0.2-0.5 % ophthalmic solution  Generic drug:  brimonidine-timolol     diltiazem 360 MG 24 hr capsule  Commonly known as:  TIAZAC  Take 360 mg by mouth daily.     glucose blood test strip  Commonly known as:  ACCU-CHEK AVIVA PLUS  Use as instructed to check blood sugar 2 times per day dx code E11.65     insulin aspart 100 UNIT/ML FlexPen  Commonly known as:  NOVOLOG FLEXPEN  Inject 9-10 units 3 times per day     Insulin Detemir 100 UNIT/ML Pen  Commonly known as:  LEVEMIR FLEXTOUCH  Inject 10 Units into the skin daily at 10 pm.     Insulin Pen Needle 31G X 5 MM Misc  Commonly known as:  B-D UF III MINI PEN NEEDLES  1 each by Does not apply route 2 (two) times daily.     latanoprost 0.005 % ophthalmic solution  Commonly known as:  XALATAN  Place 0.005 % into the left eye as directed.     Liraglutide 18 MG/3ML Sopn  Inject 1.8 mg into the skin daily.     megestrol 20 MG tablet  Commonly known as:  MEGACE  Take 20 mg by mouth 2 (two) times daily.     metFORMIN 500 MG tablet  Commonly known as:  GLUCOPHAGE  Take 1 tablet (500 mg total) by mouth 3 (three) times daily.     oxyCODONE-acetaminophen 5-325 MG per tablet  Commonly known as:  PERCOCET/ROXICET  Take 1 tablet by mouth every 4 (four) hours as needed for severe pain.     pioglitazone 30 MG tablet  Commonly known as:  ACTOS  Take  1 tablet (30 mg total) by mouth daily.     prednisoLONE acetate 1 % ophthalmic suspension   Commonly known as:  PRED FORTE  Place into both eyes 2 (two) times daily.     tamsulosin 0.4 MG Caps capsule  Commonly known as:  FLOMAX     TEKTURNA HCT 150-12.5 MG Tabs  Generic drug:  Aliskiren-Hydrochlorothiazide  Take 12.5 mg by mouth daily.     VITAMIN D PO  Take by mouth daily. Taking 5000 daily         Total Time in preparing paper work, data evaluation and todays exam - 35 minutes  Jermichael Belmares M.D on 11/11/2014 at 12:04 PM  [facility] Office  [phone]

## 2014-11-11 NOTE — Progress Notes (Signed)
Patient given discharge instructions, and verbalized an understanding of all discharge instructions.  Patient agrees with discharge plan, and is being discharged in stable medical condition.  Patient given transportation via wheelchair. 

## 2014-11-13 LAB — TYPE AND SCREEN
ABO/RH(D): B POS
Antibody Screen: NEGATIVE
UNIT DIVISION: 0
Unit division: 0
Unit division: 0

## 2014-11-14 ENCOUNTER — Telehealth: Payer: Self-pay | Admitting: *Deleted

## 2014-11-14 ENCOUNTER — Ambulatory Visit (HOSPITAL_BASED_OUTPATIENT_CLINIC_OR_DEPARTMENT_OTHER): Payer: Medicare Other | Admitting: Oncology

## 2014-11-14 ENCOUNTER — Encounter: Payer: Self-pay | Admitting: Oncology

## 2014-11-14 ENCOUNTER — Telehealth: Payer: Self-pay | Admitting: Oncology

## 2014-11-14 VITALS — BP 116/63 | HR 105 | Temp 97.5°F | Resp 18 | Ht 71.0 in | Wt 223.1 lb

## 2014-11-14 DIAGNOSIS — C61 Malignant neoplasm of prostate: Secondary | ICD-10-CM | POA: Diagnosis not present

## 2014-11-14 DIAGNOSIS — E291 Testicular hypofunction: Secondary | ICD-10-CM

## 2014-11-14 DIAGNOSIS — C7951 Secondary malignant neoplasm of bone: Secondary | ICD-10-CM | POA: Diagnosis not present

## 2014-11-14 MED ORDER — ENZALUTAMIDE 40 MG PO CAPS: 160.0000 mg | ORAL_CAPSULE | Freq: Every day | ORAL | Status: DC

## 2014-11-14 NOTE — Progress Notes (Signed)
Hematology and Oncology Follow Up Visit  Jeremy Jennings [id number] 1935-08-25 79 y.o. 11/14/2014 8:40 AM Jeremy Jennings, MDBrackbill, Jeremy Moores, MD   Principle Diagnosis: 79 year old gentleman diagnosed with prostate cancer in April 2015. He presented with a PSA of 30 and a Gleason score 5+4 = 9. Staging workup showed metastatic bony disease with some lymphadenopathy.   Prior Therapy:  He was treated with androgen deprivation under the care of Dr. Junious Jennings with a PSA nadir close to 1.8. Most recently his PSA was increased up to 18 and repeat a bone scan showed progression of his disease indicating castration resistant disease.  Current therapy:  He is currently receiving androgen deprivation as well as monthly Xgeva. He is under evaluation for possible salvage therapy  Interim History: Jeremy Jennings presents today for a follow-up visit. He is a pleasant gentleman saw in consultation back on 11/20/2013 for advanced prostate cancer. At that time he has hormone sensitive disease but since then he appears to have developed castration resistant disease. His PSA has increased close to 18 in his follow-up bone scan on 10/29/2014 showed progression of disease. He was hospitalized briefly between May 1 and 11/11/2014 for chest pain. His cardiac evaluation was unrevealing. He was also evaluated by Dr. Tammi Jennings for possible radiation therapy. Since his discharge from the hospital, he is feeling well. He reports very little to no pain at this time. He does report some occasional spasm in his neck but no diffuse bony pain. He does not report any pathological fractures. His mobility is back to normal.  He does not report any constitutional symptoms of weight loss or appetite changes. He did not report any deterioration in his performance status or activity level. He continues to live independently without an decline. He does not report any shortness of breath or hemoptysis. He does not report any nausea, vomiting and  his abdominal pain resolved. He does not report any frequency urgency or hesitancy. The remaining review of systems unremarkable.  Medications: I have reviewed the patient's current medications.  Current Outpatient Prescriptions  Medication Sig Dispense Refill  . ACCU-CHEK SOFTCLIX LANCETS lancets Use as instructed to check blood sugar 2 times per day dx code E11.65 100 each 3  . atorvastatin (LIPITOR) 10 MG tablet take 1 tablet by mouth once daily 30 tablet 5  . Blood Glucose Monitoring Suppl (ACCU-CHEK AVIVA PLUS) W/DEVICE KIT Use to check blood sugar 2 times per day dx code E11.65 1 kit 0  . carvedilol (COREG) 12.5 MG tablet Take 1 tablet (12.5 mg total) by mouth 2 (two) times daily with a meal. 60 tablet 5  . Cholecalciferol (VITAMIN D PO) Take by mouth daily. Taking 5000 daily    . COMBIGAN 0.2-0.5 % ophthalmic solution     . diltiazem (TIAZAC) 360 MG 24 hr capsule Take 360 mg by mouth daily.      . enzalutamide (XTANDI) 40 MG capsule Take 4 capsules (160 mg total) by mouth daily. 120 capsule 0  . glucose blood (ACCU-CHEK AVIVA PLUS) test strip Use as instructed to check blood sugar 2 times per day dx code E11.65 100 each 3  . insulin aspart (NOVOLOG FLEXPEN) 100 UNIT/ML FlexPen Inject 9-10 units 3 times per day (Patient taking differently: Inject 12 units 3 times per day with meals) 15 mL 3  . Insulin Detemir (LEVEMIR FLEXTOUCH) 100 UNIT/ML Pen Inject 10 Units into the skin daily at 10 pm. (Patient taking differently: Inject 12 Units into the skin daily at 10 pm. )  15 mL 2  . Insulin Pen Needle (B-D UF III MINI PEN NEEDLES) 31G X 5 MM MISC 1 each by Does not apply route 2 (two) times daily. 100 each 11  . latanoprost (XALATAN) 0.005 % ophthalmic solution Place 0.005 % into the left eye as directed.     . Liraglutide 18 MG/3ML SOPN Inject 1.8 mg into the skin daily. 9 pen 1  . megestrol (MEGACE) 20 MG tablet Take 20 mg by mouth 2 (two) times daily.    . metFORMIN (GLUCOPHAGE) 500 MG tablet  Take 1 tablet (500 mg total) by mouth 3 (three) times daily. 270 tablet 1  . oxyCODONE-acetaminophen (PERCOCET/ROXICET) 5-325 MG per tablet Take 1 tablet by mouth every 4 (four) hours as needed for severe pain. 30 tablet 0  . pioglitazone (ACTOS) 30 MG tablet Take 1 tablet (30 mg total) by mouth daily. 90 tablet 1  . prednisoLONE acetate (PRED FORTE) 1 % ophthalmic suspension Place into both eyes 2 (two) times daily.     . tamsulosin (FLOMAX) 0.4 MG CAPS capsule     . TEKTURNA HCT 150-12.5 MG TABS Take 12.5 mg by mouth daily. 30 each 3   No current facility-administered medications for this visit.     Allergies: No Known Allergies  Past Medical History, Surgical history, Social history, and Family History were reviewed and updated.   Physical Exam: Blood pressure 116/63, pulse 105, temperature 97.5 F (36.4 C), temperature source Oral, resp. rate 18, height 5' 11"  (1.803 m), weight 223 lb 1.6 oz (101.197 kg), SpO2 100 %. ECOG: 0 General appearance: alert and cooperative Head: Normocephalic, without obvious abnormality Neck: no adenopathy Lymph nodes: Cervical, supraclavicular, and axillary nodes normal. Heart:regular rate and rhythm, S1, S2 normal, no murmur, click, rub or gallop Lung:chest clear, no wheezing, rales, normal symmetric air entry Abdomin: soft, non-tender, without masses or organomegaly EXT:no erythema, induration, or nodules   Lab Results: Lab Results  Component Value Date   WBC 7.8 11/11/2014   HGB 8.7* 11/11/2014   HCT 26.7* 11/11/2014   MCV 86.1 11/11/2014   PLT 412* 11/11/2014     Chemistry      Component Value Date/Time   NA 135 11/11/2014 0510   K 4.2 11/11/2014 0510   CL 105 11/11/2014 0510   CO2 21* 11/11/2014 0510   BUN 25* 11/11/2014 0510   CREATININE 1.09 11/11/2014 0510      Component Value Date/Time   CALCIUM 7.7* 11/11/2014 0510   ALKPHOS 236* 11/10/2014 0508   AST 14* 11/10/2014 0508   ALT 17 11/10/2014 0508   BILITOT 0.5 11/10/2014  0508       Radiological Studies:  EXAM: NUCLEAR MEDICINE WHOLE BODY BONE SCAN  TECHNIQUE: Whole body anterior and posterior images were obtained approximately 3 hours after intravenous injection of radiopharmaceutical.  RADIOPHARMACEUTICALS: 26.3 mCi Technetium-99 MDP  COMPARISON: Nuclear bone scan of June 30, 2014  FINDINGS: There is adequate uptake of the radiopharmaceutical by the skeleton. Adequate soft tissue clearance and renal activity is present.  Calvarium: There is a punctate focus of increased uptake in the paramedian region of the occipital bone. There is minimally increased uptake in the left lateral parietal bone.  Spine: There are multiple foci of increased uptake within the cervical, thoracic, and lumbar spine. These are new since the previous study. The most significant uptake is noted an approximately T2, T10, and L1.  Ribs and sternum: There are multiple bilateral foci of increased uptake consistent with widespread metastases.  Pectoral girdle: There is a focus of increased uptake in the left proximal humeral shaft. There is also mildly increased uptake in the left humeral neck.  Pelvis and pelvic girdle: There is evidence of progressive disease involving the left hemipelvis including the left sacral ala and mid body of the sacrum. Abnormal activity in the ischium and pubic bones on the left is present and more conspicuous. There are at least 2 foci of increased uptake in the proximal and mid shaft of the right femur. There is mildly increased uptake in the intertrochanteric region and neck of the right femur.  COMPLICATIONS: Interval development of innumerable new widespread bony metastases as described with progressive changes in the left hemipelvis.   Impression and Plan:   79 year old gentleman with the following issues:  1. Castration resistant metastatic prostate cancer with disease to the bone. His initial diagnosis  was in April 2015 with a PSA of 30 and a Gleason score of 5+4 = 9. After initial response with androgen deprivation, he developed castration resistant disease with clear progression by bone scan criteria noted on his recent bone scan on 10/29/2014. He also had a CT scan of the abdomen on 11/09/2014 which did not show any evidence of clear-cut visceral metastasis.  Options of treatment were discussed today as well as the natural course of this disease. He understands any treatment options were report will be palliative at this time. These options would include systemic chemotherapy, cycle on hormonal agents, immunotherapy in the form of Provenge and Xofigo. The risks and benefits of all these modalities were discussed today. Given the fact that he really does not have any major bony pain I do agree with Dr. Tammi Jennings that radiation therapy to Trudi Ida can be used at a later date.  He is interested in second line hormonal therapy and I discussed with him the complications associated specifically with Xtandi. His complications would include lower extremity edema, hypotension, fatigue and rarely seizures. He is agreeable to proceed at this time.  2. Androgen depravation: I have recommended continuing that indefinitely.  3. Bone directed therapy: I have recommended to continue Xgeva on a monthly basis as he is doing.  4. Follow-up: Will be in about 4 weeks to assess complications associated with this medication.   [facility], MD 5/6/20168:40 AM

## 2014-11-14 NOTE — Progress Notes (Signed)
Will call patient today

## 2014-11-14 NOTE — Telephone Encounter (Signed)
Gave avs & calendar for June. °

## 2014-11-14 NOTE — Progress Notes (Signed)
Prescription for Xtandi given to [facility].

## 2014-11-14 NOTE — Telephone Encounter (Signed)
TC from pt's daughter wondering when new medication would ready for her father. She could not remember the name of the medication. She states there were no new prescriptions at their local pharmacy.  Reviewed Dr. Hazeline Junker notes. Appears from his notes that the medication is [name] and is going through [facility]. Explained to daughter that this medication is being filled through specialty pharmacy and will be delivered. Instructed the daughter to check back with Dr. Hazeline Junker nurse early next week. She voiced understanding.

## 2014-11-14 NOTE — Progress Notes (Signed)
I faxed [facility] for possible xtandi [phone]

## 2014-11-17 ENCOUNTER — Ambulatory Visit: Payer: Medicare Other | Admitting: Radiation Oncology

## 2014-11-21 ENCOUNTER — Encounter: Payer: Self-pay | Admitting: Oncology

## 2014-11-21 NOTE — Progress Notes (Signed)
Per [facility] copy 96.60 and [name] was delivered to patient on 11/21/14

## 2014-12-03 ENCOUNTER — Ambulatory Visit: Payer: Medicare Other | Admitting: Oncology

## 2014-12-04 ENCOUNTER — Ambulatory Visit: Payer: Self-pay | Admitting: Radiation Oncology

## 2014-12-05 ENCOUNTER — Other Ambulatory Visit: Payer: Self-pay | Admitting: *Deleted

## 2014-12-05 MED ORDER — INSULIN ASPART 100 UNIT/ML FLEXPEN: PEN_INJECTOR | SUBCUTANEOUS | Status: DC

## 2014-12-10 ENCOUNTER — Encounter: Payer: Self-pay | Admitting: Cardiology

## 2014-12-10 ENCOUNTER — Ambulatory Visit (INDEPENDENT_AMBULATORY_CARE_PROVIDER_SITE_OTHER): Payer: Medicare Other | Admitting: Cardiology

## 2014-12-10 VITALS — BP 110/58 | HR 96 | Ht 71.0 in | Wt 218.0 lb

## 2014-12-10 DIAGNOSIS — M6530 Trigger finger, unspecified finger: Secondary | ICD-10-CM

## 2014-12-10 DIAGNOSIS — E785 Hyperlipidemia, unspecified: Secondary | ICD-10-CM | POA: Diagnosis not present

## 2014-12-10 DIAGNOSIS — D649 Anemia, unspecified: Secondary | ICD-10-CM

## 2014-12-10 DIAGNOSIS — I10 Essential (primary) hypertension: Secondary | ICD-10-CM

## 2014-12-10 DIAGNOSIS — I119 Hypertensive heart disease without heart failure: Secondary | ICD-10-CM | POA: Diagnosis not present

## 2014-12-10 DIAGNOSIS — IMO0002 Reserved for concepts with insufficient information to code with codable children: Secondary | ICD-10-CM

## 2014-12-10 DIAGNOSIS — I491 Atrial premature depolarization: Secondary | ICD-10-CM

## 2014-12-10 DIAGNOSIS — Z79899 Other long term (current) drug therapy: Secondary | ICD-10-CM

## 2014-12-10 LAB — HEPATIC FUNCTION PANEL
ALBUMIN: 3.2 g/dL — AB (ref 3.5–5.2)
ALT: 9 U/L (ref 0–53)
AST: 12 U/L (ref 0–37)
Alkaline Phosphatase: 411 U/L — ABNORMAL HIGH (ref 39–117)
BILIRUBIN DIRECT: 0 mg/dL (ref 0.0–0.3)
Total Bilirubin: 0.2 mg/dL (ref 0.2–1.2)
Total Protein: 7 g/dL (ref 6.0–8.3)

## 2014-12-10 LAB — LIPID PANEL
Cholesterol: 167 mg/dL (ref 0–200)
HDL: 37.5 mg/dL — AB (ref >39.00)
LDL CALC: 108 mg/dL — AB (ref 0–99)
NonHDL: 129.5
Total CHOL/HDL Ratio: 4
Triglycerides: 109 mg/dL (ref 0.0–149.0)
VLDL: 21.8 mg/dL (ref 0.0–40.0)

## 2014-12-10 LAB — CBC WITH DIFFERENTIAL/PLATELET
BASOS ABS: 0 10*3/uL (ref 0.0–0.1)
Basophils Relative: 0.4 % (ref 0.0–3.0)
EOS ABS: 0.1 10*3/uL (ref 0.0–0.7)
EOS PCT: 1.6 % (ref 0.0–5.0)
HCT: 27.5 % — ABNORMAL LOW (ref 39.0–52.0)
Hemoglobin: 9.1 g/dL — ABNORMAL LOW (ref 13.0–17.0)
Lymphocytes Relative: 17.4 % (ref 12.0–46.0)
Lymphs Abs: 1.1 10*3/uL (ref 0.7–4.0)
MCHC: 33.1 g/dL (ref 30.0–36.0)
MCV: 85.3 fl (ref 78.0–100.0)
MONOS PCT: 9.9 % (ref 3.0–12.0)
Monocytes Absolute: 0.6 10*3/uL (ref 0.1–1.0)
NEUTROS ABS: 4.5 10*3/uL (ref 1.4–7.7)
Neutrophils Relative %: 70.7 % (ref 43.0–77.0)
PLATELETS: 370 10*3/uL (ref 150.0–400.0)
RBC: 3.23 Mil/uL — ABNORMAL LOW (ref 4.22–5.81)
RDW: 18.4 % — ABNORMAL HIGH (ref 11.5–15.5)
WBC: 6.4 10*3/uL (ref 4.0–10.5)

## 2014-12-10 LAB — BASIC METABOLIC PANEL
BUN: 25 mg/dL — ABNORMAL HIGH (ref 6–23)
CO2: 26 mEq/L (ref 19–32)
CREATININE: 1.06 mg/dL (ref 0.40–1.50)
Calcium: 8.4 mg/dL (ref 8.4–10.5)
Chloride: 105 mEq/L (ref 96–112)
GFR: 87.11 mL/min (ref >60.00)
Glucose, Bld: 68 mg/dL — ABNORMAL LOW (ref 70–99)
POTASSIUM: 3.9 meq/L (ref 3.5–5.1)
Sodium: 135 mEq/L (ref 135–145)

## 2014-12-10 NOTE — Progress Notes (Signed)
  Cardiology Office Note   Date:  12/10/2014   ID:  Jeremy Jennings, DOB 11/03/1935, MRN [id number]  PCP:  , Tom, MD  Cardiologist:   MD  No chief complaint on file.     History of Present Illness: Jeremy Jennings is a 79 y.o. male who presents for a four-month follow-up office visit.   He has a past history of hypertension and diabetes. He has a history of prostate cancer and is being followed by Dr. Matthew Eskridge. He had a normal treadmill Cardiolite stress test in 2005. He has had a history of mild cardiomegaly and an echocardiogram in 2007 showed mild LVH with normal systolic function and with diastolic dysfunction. The patient gets some exercise. He enjoys gardening.  He has not been aware of any palpitations.  Since last visit he has had no new cardiac symptoms. Denies any chest pain or shortness of breath. The patient has a history of diabetes and is followed by Dr. Kumar. The patient was hospitalized on 11/09/14 until 11/11/14 before stomach spasms and was found to have an anemia of hemoglobin 7.4.  In the hospital he received 2 units of packed cells and at discharge his hemoglobin was up to 8.7.  He is followed from an oncology standpoint by Dr. Shadad.he receives a hormone shot every 6 months for his metastatic prostate cancer Recently the patient has had difficulty with bilateral trigger fingers of each hand.  He requests to see a hand surgeon regarding this. The patient has had some recent problems with constipation and abdominal spasms. .  Past Medical History  Diagnosis Date  . Hypertension   . Diabetes mellitus   . Heart murmur     SYSTOLIC MURMUR AT BASE  . Hyperlipidemia   . Prostate cancer     Past Surgical History  Procedure Laterality Date  . Tonsillectomy      IN [facility]  . Hemorroidectomy  1969  . Hernia repair  [facility] 16-17  . Hernia repair  1981    DR WEATHERLY     Current Outpatient Prescriptions  Medication Sig Dispense  Refill  . ACCU-CHEK SOFTCLIX LANCETS lancets Use as instructed to check blood sugar 2 times per day dx code E11.65 100 each 3  . atorvastatin (LIPITOR) 10 MG tablet take 1 tablet by mouth once daily 30 tablet 5  . Blood Glucose Monitoring Suppl (ACCU-CHEK AVIVA PLUS) W/DEVICE KIT Use to check blood sugar 2 times per day dx code E11.65 1 kit 0  . carvedilol (COREG) 12.5 MG tablet Take 1 tablet (12.5 mg total) by mouth 2 (two) times daily with a meal. 60 tablet 5  . Cholecalciferol (VITAMIN D PO) Take 5,000 Units by mouth daily. Taking 5000 daily    . COMBIGAN 0.2-0.5 % ophthalmic solution Place 1 drop into the right eye daily.     . diltiazem (TIAZAC) 360 MG 24 hr capsule Take 360 mg by mouth daily.      . docusate sodium (COLACE) 100 MG capsule Take 200 mg by mouth daily.    . enzalutamide (XTANDI) 40 MG capsule Take 4 capsules (160 mg total) by mouth daily. 120 capsule 0  . glucose blood (ACCU-CHEK AVIVA PLUS) test strip Use as instructed to check blood sugar 2 times per day dx code E11.65 100 each 3  . insulin aspart (NOVOLOG FLEXPEN) 100 UNIT/ML FlexPen Inject 12 units 3 times per day with meals 15 mL 3  . Insulin Detemir (LEVEMIR) 100 UNIT/ML Pen Inject   12 Units into the skin daily at 10 pm.    . Insulin Pen Needle (B-D UF III MINI PEN NEEDLES) 31G X 5 MM MISC 1 each by Does not apply route 2 (two) times daily. 100 each 11  . latanoprost (XALATAN) 0.005 % ophthalmic solution Place 0.005 % into the left eye at bedtime.     . Liraglutide 18 MG/3ML SOPN Inject 1.8 mg into the skin daily. 9 pen 1  . megestrol (MEGACE) 20 MG tablet Take 20 mg by mouth 2 (two) times daily.    . metFORMIN (GLUCOPHAGE) 500 MG tablet Take 1 tablet (500 mg total) by mouth 3 (three) times daily. 270 tablet 1  . oxyCODONE-acetaminophen (PERCOCET/ROXICET) 5-325 MG per tablet Take 1 tablet by mouth every 4 (four) hours as needed for severe pain. 30 tablet 0  . pioglitazone (ACTOS) 30 MG tablet Take 1 tablet (30 mg total) by  mouth daily. 90 tablet 1  . prednisoLONE acetate (PRED FORTE) 1 % ophthalmic suspension Place 1 drop into both eyes 2 (two) times daily.     . tamsulosin (FLOMAX) 0.4 MG CAPS capsule Take 0.4 mg by mouth daily.     Marisa Severin HCT 150-12.5 MG TABS Take 12.5 mg by mouth daily. 30 each 3   No current facility-administered medications for this visit.    Allergies:   Review of patient's allergies indicates no known allergies.    Social History:  The patient  reports that he quit smoking about 35 years ago. His smoking use included Cigarettes. He has a 5 pack-year smoking history. He has never used smokeless tobacco. He reports that he does not drink alcohol or use illicit drugs.   Family History:  The patient's family history includes Kidney failure in his brother and sister. There is no history of Cancer.    ROS:  Please see the history of present illness.   Otherwise, review of systems are positive for none.   All other systems are reviewed and negative.    PHYSICAL EXAM: VS:  BP 110/58 mmHg  Pulse 96  Ht 5' 11" (1.803 m)  Wt 218 lb (98.884 kg)  BMI 30.42 kg/m2 , BMI Body mass index is 30.42 kg/(m^2). GEN: Well nourished, well developed, in no acute distress HEENT: normal Neck: no JVD, carotid bruits, or masses Cardiac: Regular rhythm with premature atrial beats.  no murmurs, rubs, or gallops,no edema  Respiratory:  clear to auscultation bilaterally, normal work of breathing GI: soft, nontender, nondistended, + BS MS: no deformity or atrophy.  He has trigger finger of each hand Skin: warm and dry, no rash Neuro:  Strength and sensation are intact Psych: euthymic mood, full affect   EKG:  EKG is ordered today. The ekg ordered today demonstrates normal sinus rhythm with occasional PAC.  No acute changes.   Recent Labs: 11/09/2014: Magnesium 2.3; TSH 2.676 12/10/2014: ALT 9; BUN 25*; Creatinine 1.06; Hemoglobin 9.1*; Platelets 370.0; Potassium 3.9; Sodium 135    Lipid Panel      Component Value Date/Time   CHOL 167 12/10/2014 1013   TRIG 109.0 12/10/2014 1013   HDL 37.50* 12/10/2014 1013   CHOLHDL 4 12/10/2014 1013   VLDL 21.8 12/10/2014 1013   LDLCALC 108* 12/10/2014 1013      Wt Readings from Last 3 Encounters:  12/10/14 218 lb (98.884 kg)  11/14/14 223 lb 1.6 oz (101.197 kg)  11/11/14 221 lb 1.9 oz (100.3 kg)         ASSESSMENT AND PLAN:  1. hypertensive heart disease without heart failure 2. Dyslipidemia 3. diabetes mellitus 4. frequent PACs, asymptomatic 5. prostate cancer with elevated PSA, followed by [name] 6. Sinus tachycardia 7.  Bilateral trigger fingers   Current medicines are reviewed at length with the patient today. The patient does not have concerns regarding medicines.  The following changes have been made: The patient will start Colace 2 tablets a day to help his constipation  Current medicines are reviewed at length with the patient today.  The patient has no concerns regarding medicines.  The following changes have been made:  no change  Labs/ tests ordered today include:   Orders Placed This Encounter  Procedures  . CBC with Differential/Platelet  . Lipid Profile  . Lipid panel  . Hepatic function panel  . Basic metabolic panel  . CBC with Differential/Platelet  . EKG 12-Lead     Disposition:   We will refer him to Dr. Amedeo Plenty for his trigger finger predicament. Recheck in 4 months for office visit CBC lipid panel hepatic function panel and basal metabolic panel.  We are checking lab work today as well  Signed, Darlin Coco MD 12/10/2014 2:25 PM    [address], [place], [place]  [address] Phone: [phone]; Fax: [phone]

## 2014-12-10 NOTE — Patient Instructions (Addendum)
Medication Instructions:  START COLACE 100 MG 2 DAILY   Labwork: CBC/LP/BMET/HFP  Testing/Procedures: NONE  Follow-Up: Your physician wants you to follow-up in:  You will receive a reminder letter in the mail two months in advance. If you don't receive a letter, please call our office to schedule the follow-up appointment.  4 months with fasting labs (lp/bmet/hfp/CBC)   WILL ARRANGE FOR YOU TO SEE DR GRAMIG, WILL CALL YOU WITH APPOINTMENT

## 2014-12-12 ENCOUNTER — Telehealth: Payer: Self-pay | Admitting: *Deleted

## 2014-12-12 NOTE — Telephone Encounter (Signed)
Appointment with Dr Amedeo Plenty 02/04/15 at 12:15 Patient aware of appointment and labs

## 2014-12-12 NOTE — Telephone Encounter (Signed)
-----   Message from Darlin Coco, MD sent at 12/11/2014  7:58 AM EDT ----- Please report.  The blood work is stable.  The alkaline phosphatase is higher and this is related to his prostate cancer.  The other liver tests are normal.  His hemoglobin has improved further since discharge from the hospital.  Continue same medicines.

## 2014-12-17 ENCOUNTER — Other Ambulatory Visit: Payer: Self-pay | Admitting: *Deleted

## 2014-12-17 DIAGNOSIS — C61 Malignant neoplasm of prostate: Secondary | ICD-10-CM

## 2014-12-17 MED ORDER — ENZALUTAMIDE 40 MG PO CAPS: 160.0000 mg | ORAL_CAPSULE | Freq: Every day | ORAL | Status: DC

## 2014-12-17 NOTE — Telephone Encounter (Signed)
Jeremy Jennings with [facility] called reporting patient's insurance requires [facility] as preferred pharmacy.  [name] will have to be billed and shipped from [facility].

## 2014-12-18 ENCOUNTER — Telehealth: Payer: Self-pay | Admitting: *Deleted

## 2014-12-18 ENCOUNTER — Ambulatory Visit: Payer: Medicare Other | Admitting: Cardiology

## 2014-12-18 ENCOUNTER — Encounter: Payer: Self-pay | Admitting: *Deleted

## 2014-12-18 NOTE — Telephone Encounter (Signed)
TC from [facility] from [place] stating they need to transfer prescription to [facility] per pt's insurance.  They are faxing information to [place] as well as [facility].

## 2014-12-23 ENCOUNTER — Telehealth: Payer: Self-pay | Admitting: Oncology

## 2014-12-23 ENCOUNTER — Ambulatory Visit (HOSPITAL_BASED_OUTPATIENT_CLINIC_OR_DEPARTMENT_OTHER): Payer: Medicare Other | Admitting: Oncology

## 2014-12-23 ENCOUNTER — Other Ambulatory Visit (INDEPENDENT_AMBULATORY_CARE_PROVIDER_SITE_OTHER): Payer: Medicare Other

## 2014-12-23 VITALS — BP 110/62 | HR 100 | Temp 97.5°F | Resp 18 | Wt 214.5 lb

## 2014-12-23 DIAGNOSIS — C7951 Secondary malignant neoplasm of bone: Secondary | ICD-10-CM | POA: Diagnosis not present

## 2014-12-23 DIAGNOSIS — IMO0002 Reserved for concepts with insufficient information to code with codable children: Secondary | ICD-10-CM

## 2014-12-23 DIAGNOSIS — E291 Testicular hypofunction: Secondary | ICD-10-CM

## 2014-12-23 DIAGNOSIS — E1165 Type 2 diabetes mellitus with hyperglycemia: Secondary | ICD-10-CM | POA: Diagnosis not present

## 2014-12-23 DIAGNOSIS — C61 Malignant neoplasm of prostate: Secondary | ICD-10-CM

## 2014-12-23 LAB — BASIC METABOLIC PANEL
BUN: 26 mg/dL — ABNORMAL HIGH (ref 6–23)
CHLORIDE: 105 meq/L (ref 96–112)
CO2: 24 meq/L (ref 19–32)
CREATININE: 1.15 mg/dL (ref 0.40–1.50)
Calcium: 8.6 mg/dL (ref 8.4–10.5)
GFR: 79.28 mL/min (ref >60.00)
Glucose, Bld: 192 mg/dL — ABNORMAL HIGH (ref 70–99)
POTASSIUM: 4.1 meq/L (ref 3.5–5.1)
Sodium: 135 mEq/L (ref 135–145)

## 2014-12-23 LAB — HEMOGLOBIN A1C: Hgb A1c MFr Bld: 7.4 % — ABNORMAL HIGH (ref 4.6–6.5)

## 2014-12-23 NOTE — Progress Notes (Signed)
Hematology and Oncology Follow Up Visit  Jeremy Jennings [id number] 1935-10-17 79 y.o. 12/23/2014 3:52 PM Jeremy Jennings, MDBrackbill, [name], MD   Principle Diagnosis: 79 year old gentleman diagnosed with prostate cancer in April 2015. He presented with a PSA of 30 and a Gleason score 5+4 = 9. Staging workup showed metastatic bony disease with some lymphadenopathy.   Prior Therapy:  He was treated with androgen deprivation under the care of [name] with a PSA nadir close to 1.8. Most recently his PSA was increased up to 18 and repeat a bone scan showed progression of his disease indicating castration resistant disease.  Current therapy:  He is currently receiving androgen deprivation as well as monthly Xgeva. Xtandi 160 mg started in May 2016.  Interim History: Jeremy Jennings presents today for a follow-up visit. Since the last visit, he started [place] and took a whole month without any complications. He denied any nausea or abdominal pain. He denied any lower extremity edema or constitutional symptoms. He continues to be active and performs activities of daily living. he is feeling well. He reports very little to no pain at this time. He does report some occasional spasm in his neck but no diffuse bony pain. He does not report any pathological fractures.  He does not report any constitutional symptoms of weight loss or appetite changes. He did not report any deterioration in his performance status or activity level. He does not report any shortness of breath or hemoptysis. He does not report any nausea, vomiting and his abdominal pain resolved. He does not report any frequency urgency or hesitancy. The remaining review of systems unremarkable.  Medications: I have reviewed the patient's current medications.  Current Outpatient Prescriptions  Medication Sig Dispense Refill  . ACCU-CHEK SOFTCLIX LANCETS lancets Use as instructed to check blood sugar 2 times per day dx code E11.65 100 each 3   . atorvastatin (LIPITOR) 10 MG tablet take 1 tablet by mouth once daily 30 tablet 5  . Blood Glucose Monitoring Suppl (ACCU-CHEK AVIVA PLUS) W/DEVICE KIT Use to check blood sugar 2 times per day dx code E11.65 1 kit 0  . carvedilol (COREG) 12.5 MG tablet Take 1 tablet (12.5 mg total) by mouth 2 (two) times daily with a meal. 60 tablet 5  . Cholecalciferol (VITAMIN D PO) Take 5,000 Units by mouth daily. Taking 5000 daily    . COMBIGAN 0.2-0.5 % ophthalmic solution Place 1 drop into the right eye daily.     Marland Kitchen diltiazem (TIAZAC) 360 MG 24 hr capsule Take 360 mg by mouth daily.      Marland Kitchen docusate sodium (COLACE) 100 MG capsule Take 200 mg by mouth daily.    . enzalutamide (XTANDI) 40 MG capsule Take 4 capsules (160 mg total) by mouth daily. 120 capsule 0  . glucose blood (ACCU-CHEK AVIVA PLUS) test strip Use as instructed to check blood sugar 2 times per day dx code E11.65 100 each 3  . insulin aspart (NOVOLOG FLEXPEN) 100 UNIT/ML FlexPen Inject 12 units 3 times per day with meals 15 mL 3  . Insulin Detemir (LEVEMIR) 100 UNIT/ML Pen Inject 12 Units into the skin daily at 10 pm.    . Insulin Pen Needle (B-D UF III MINI PEN NEEDLES) 31G X 5 MM MISC 1 each by Does not apply route 2 (two) times daily. 100 each 11  . latanoprost (XALATAN) 0.005 % ophthalmic solution Place 0.005 % into the left eye at bedtime.     . Liraglutide 18 MG/3ML  SOPN Inject 1.8 mg into the skin daily. 9 pen 1  . megestrol (MEGACE) 20 MG tablet Take 20 mg by mouth 2 (two) times daily.    . metFORMIN (GLUCOPHAGE) 500 MG tablet Take 1 tablet (500 mg total) by mouth 3 (three) times daily. 270 tablet 1  . oxyCODONE-acetaminophen (PERCOCET/ROXICET) 5-325 MG per tablet Take 1 tablet by mouth every 4 (four) hours as needed for severe pain. 30 tablet 0  . pioglitazone (ACTOS) 30 MG tablet Take 1 tablet (30 mg total) by mouth daily. 90 tablet 1  . prednisoLONE acetate (PRED FORTE) 1 % ophthalmic suspension Place 1 drop into both eyes 2 (two)  times daily.     . tamsulosin (FLOMAX) 0.4 MG CAPS capsule Take 0.4 mg by mouth daily.     Marisa Severin HCT 150-12.5 MG TABS Take 12.5 mg by mouth daily. 30 each 3   No current facility-administered medications for this visit.     Allergies: No Known Allergies  Past Medical History, Surgical history, Social history, and Family History were reviewed and updated.   Physical Exam: There were no vitals taken for this visit. ECOG: 0 General appearance: alert and cooperative not in any distress. Head: Normocephalic, without obvious abnormality Neck: no adenopathy Lymph nodes: Cervical, supraclavicular, and axillary nodes normal. Heart:regular rate and rhythm, S1, S2 normal, no murmur, click, rub or gallop Lung:chest clear, no wheezing, rales, normal symmetric air entry Abdomin: soft, non-tender, without masses or organomegaly EXT:no erythema, induration, or nodules   Lab Results: Lab Results  Component Value Date   WBC 6.4 12/10/2014   HGB 9.1* 12/10/2014   HCT 27.5* 12/10/2014   MCV 85.3 12/10/2014   PLT 370.0 12/10/2014     Chemistry      Component Value Date/Time   NA 135 12/23/2014 0842   K 4.1 12/23/2014 0842   CL 105 12/23/2014 0842   CO2 24 12/23/2014 0842   BUN 26* 12/23/2014 0842   CREATININE 1.15 12/23/2014 0842      Component Value Date/Time   CALCIUM 8.6 12/23/2014 0842   ALKPHOS 411* 12/10/2014 1013   AST 12 12/10/2014 1013   ALT 9 12/10/2014 1013   BILITOT 0.2 12/10/2014 1013          Impression and Plan:   79 year old gentleman with the following issues:  1. Castration resistant metastatic prostate cancer with disease to the bone. His initial diagnosis was in April 2015 with a PSA of 30 and a Gleason score of 5+4 = 9. After initial response with androgen deprivation, he developed castration resistant disease with clear progression by bone scan criteria noted on his recent bone scan on 10/29/2014. He also had a CT scan of the abdomen on 11/09/2014  which did not show any evidence of clear-cut visceral metastasis.  He is currently on Xtandi since May of 2016 and tolerated it well. His PSA is currently pending from today but he have tolerated the first month without any complications. The plan is to continue with the same dose and schedule.  2. Androgen depravation: I have recommended continuing that indefinitely.  3. Bone directed therapy: I have recommended to continue Xgeva on a monthly basis as he is doing.  4. Follow-up: Will be in about 4 to 5 weeks to assess complications associated with this medication.   [facility], MD 6/14/20163:52 PM

## 2014-12-23 NOTE — Telephone Encounter (Signed)
per pof to sch pt appt-gave pt avs °

## 2014-12-26 ENCOUNTER — Other Ambulatory Visit: Payer: Self-pay

## 2014-12-26 ENCOUNTER — Encounter: Payer: Self-pay | Admitting: Endocrinology

## 2014-12-26 ENCOUNTER — Other Ambulatory Visit: Payer: Self-pay | Admitting: *Deleted

## 2014-12-26 ENCOUNTER — Ambulatory Visit (INDEPENDENT_AMBULATORY_CARE_PROVIDER_SITE_OTHER): Payer: Medicare Other | Admitting: Endocrinology

## 2014-12-26 VITALS — BP 100/50 | HR 100 | Temp 97.8°F | Resp 16 | Ht 71.0 in | Wt 216.8 lb

## 2014-12-26 DIAGNOSIS — IMO0002 Reserved for concepts with insufficient information to code with codable children: Secondary | ICD-10-CM

## 2014-12-26 DIAGNOSIS — E1165 Type 2 diabetes mellitus with hyperglycemia: Secondary | ICD-10-CM

## 2014-12-26 DIAGNOSIS — I952 Hypotension due to drugs: Secondary | ICD-10-CM | POA: Diagnosis not present

## 2014-12-26 DIAGNOSIS — E785 Hyperlipidemia, unspecified: Secondary | ICD-10-CM | POA: Diagnosis not present

## 2014-12-26 LAB — URINALYSIS
BILIRUBIN URINE: NEGATIVE
HGB URINE DIPSTICK: NEGATIVE
KETONES UR: NEGATIVE
Leukocytes, UA: NEGATIVE
NITRITE: NEGATIVE
PH: 6 (ref 5.0–8.0)
Specific Gravity, Urine: 1.02 (ref 1.000–1.030)
Total Protein, Urine: NEGATIVE
UROBILINOGEN UA: 0.2 (ref 0.0–1.0)
Urine Glucose: NEGATIVE

## 2014-12-26 LAB — MICROALBUMIN / CREATININE URINE RATIO
CREATININE, U: 96.3 mg/dL
MICROALB UR: 3 mg/dL — AB (ref 0.0–1.9)
Microalb Creat Ratio: 3.1 mg/g (ref 0.0–30.0)

## 2014-12-26 MED ORDER — ALISKIREN FUMARATE 150 MG PO TABS: 150.0000 mg | ORAL_TABLET | Freq: Every day | ORAL | Status: DC

## 2014-12-26 NOTE — Progress Notes (Signed)
Patient ID: Jeremy Jennings, male   DOB: [birth year], 79 y.o.   MRN: [id number]   Reason for Appointment:  follow-up   History of Present Illness    Type 2 DIABETES MELITUS, date of diagnosis:  1981        PREVIOUS history: He has had long-standing diabetes treated with multiple drugs over the years and still continues to take a multidrug regimen He was started on Victoza in 2010 somewhat better diabetes control and better weight However because of tendency to high postprandial readings and higher A1c was also given mealtime coverage with NovoLog about 2 years ago  Because of his A1c going up to 8.3 in 03/2013 his Victoza was increased from 1.2 up to 1.8 mg Because of worsening blood sugar control and A1c of 8.6% he was started on Levemir insulin in 02/2014  RECENT history:  Insulin regimen: 10-12 before acb acl; of NovoLog, 14 acs Levemir 14 hs   He is on a basal bolus insulin regimen along with Actos and metformin On his last visit he did not have his monitor for download and not clear what his blood sugar patterns were He has done only a few blood sugars after meals and not clear if he has consistently high blood sugars after supper, most of his readings later in the evening are appearing to be high Also difficult to know which readings in the mornings before or after breakfast as he is eating at various times; lab glucose was significantly high in the morning possibly after drinking juice No recent hypoglycemia He thinks he is compliant with his insulin at all meals Not clear if he is adjusting his Levemir based on the blood sugar at night time as he does not take the consistent dose  A1c is relatively better but recently has had more problems with anemia He has breakfast between 8-10 AM and dinner usually 6 PM  Oral hypoglycemic drugs: Actos, metformin bid     Side effects from medications: None       Proper timing of medications in relation to meals: Yes.            Monitors blood glucose:  recently less than 1 times a day.    Glucometer:  FreeStyle         Blood Glucose readings  Mean values apply above for all meters except median for One Touch  PRE-MEAL  8-10 AM  Lunch  1-5 PM   8-11 PM  Overall  Glucose range:  108-195    81-139   104-225    Mean/median:  142    116   190   145    Physical activity: exercise: Some farming; walking at mall           Dietician visit: Most recent: 1/65           Complications: are: None     Wt Readings from Last 3 Encounters:  12/26/14 216 lb 12.8 oz (98.34 kg)  12/23/14 214 lb 8 oz (97.297 kg)  12/10/14 218 lb (98.884 kg)   LABS:   Lab Results  Component Value Date   HGBA1C 7.4* 12/23/2014   HGBA1C 8.3* 09/22/2014   HGBA1C 8.4* 06/23/2014   Lab Results  Component Value Date   MICROALBUR 2.8* 03/12/2013   LDLCALC 108* 12/10/2014   CREATININE 1.15 12/23/2014    Lab on 12/23/2014  Component Date Value Ref Range Status  . Sodium 12/23/2014 135  135 - 145 mEq/L Final  .  Potassium 12/23/2014 4.1  3.5 - 5.1 mEq/L Final  . Chloride 12/23/2014 105  96 - 112 mEq/L Final  . CO2 12/23/2014 24  19 - 32 mEq/L Final  . Glucose, Bld 12/23/2014 192* 70 - 99 mg/dL Final  . BUN 12/23/2014 26* 6 - 23 mg/dL Final  . Creatinine, Ser 12/23/2014 1.15  0.40 - 1.50 mg/dL Final  . Calcium 12/23/2014 8.6  8.4 - 10.5 mg/dL Final  . GFR 12/23/2014 79.28  >60.00 mL/min Final  . Hgb A1c MFr Bld 12/23/2014 7.4* 4.6 - 6.5 % Final   Glycemic Control Guidelines for People with Diabetes:Non Diabetic:  <6%Goal of Therapy: <7%Additional Action Suggested:  >8%       Medication List       This list is accurate as of: 12/26/14 12:49 PM.  Always use your most recent med list.               ACCU-CHEK AVIVA PLUS W/DEVICE Kit  Use to check blood sugar 2 times per day dx code E11.65     ACCU-CHEK SOFTCLIX LANCETS lancets  Use as instructed to check blood sugar 2 times per day dx code E11.65     aliskiren 150 MG tablet   Commonly known as:  TEKTURNA  Take 1 tablet (150 mg total) by mouth daily.     atorvastatin 10 MG tablet  Commonly known as:  LIPITOR  take 1 tablet by mouth once daily     carvedilol 12.5 MG tablet  Commonly known as:  COREG  Take 1 tablet (12.5 mg total) by mouth 2 (two) times daily with a meal.     COMBIGAN 0.2-0.5 % ophthalmic solution  Generic drug:  brimonidine-timolol  Place 1 drop into the right eye daily.     diltiazem 360 MG 24 hr capsule  Commonly known as:  TIAZAC  Take 360 mg by mouth daily.     docusate sodium 100 MG capsule  Commonly known as:  COLACE  Take 200 mg by mouth daily.     enzalutamide 40 MG capsule  Commonly known as:  XTANDI  Take 4 capsules (160 mg total) by mouth daily.     glucose blood test strip  Commonly known as:  ACCU-CHEK AVIVA PLUS  Use as instructed to check blood sugar 2 times per day dx code E11.65     insulin aspart 100 UNIT/ML FlexPen  Commonly known as:  NOVOLOG FLEXPEN  Inject 12 units 3 times per day with meals     Insulin Detemir 100 UNIT/ML Pen  Commonly known as:  LEVEMIR  Inject 12 Units into the skin daily at 10 pm.     Insulin Pen Needle 31G X 5 MM Misc  Commonly known as:  B-D UF III MINI PEN NEEDLES  1 each by Does not apply route 2 (two) times daily.     latanoprost 0.005 % ophthalmic solution  Commonly known as:  XALATAN  Place 0.005 % into the left eye at bedtime.     Liraglutide 18 MG/3ML Sopn  Inject 1.8 mg into the skin daily.     megestrol 20 MG tablet  Commonly known as:  MEGACE  Take 20 mg by mouth 2 (two) times daily.     metFORMIN 500 MG tablet  Commonly known as:  GLUCOPHAGE  Take 1 tablet (500 mg total) by mouth 3 (three) times daily.     oxyCODONE-acetaminophen 5-325 MG per tablet  Commonly known as:  PERCOCET/ROXICET  Take 1 tablet  by mouth every 4 (four) hours as needed for severe pain.     pioglitazone 30 MG tablet  Commonly known as:  ACTOS  Take 1 tablet (30 mg total) by mouth  daily.     prednisoLONE acetate 1 % ophthalmic suspension  Commonly known as:  PRED FORTE  Place 1 drop into both eyes 2 (two) times daily.     tamsulosin 0.4 MG Caps capsule  Commonly known as:  FLOMAX  Take 0.4 mg by mouth daily.     TEKTURNA HCT 150-12.5 MG Tabs  Generic drug:  Aliskiren-Hydrochlorothiazide  Take 12.5 mg by mouth daily.     VITAMIN D PO  Take 5,000 Units by mouth daily. Taking 5000 daily        Allergies: No Known Allergies  Past Medical History  Diagnosis Date  . Hypertension   . Diabetes mellitus   . Heart murmur     SYSTOLIC MURMUR AT BASE  . Hyperlipidemia   . Prostate cancer     Past Surgical History  Procedure Laterality Date  . Tonsillectomy      IN [facility]  . Hemorroidectomy  1969  . Hernia repair  AFE 18-17  . Hernia repair  1981    [facility] [place]    Family History  Problem Relation Age of Onset  . Cancer Neg Hx   . Kidney failure Brother   . Kidney failure Sister     Social History:  reports that he quit smoking about 35 years ago. His smoking use included Cigarettes. He has a 5 pack-year smoking history. He has never used smokeless tobacco. He reports that he does not drink alcohol or use illicit drugs.  Review of Systems:   HYPERTENSION:   followed by cardiologist.   Now he is taking Coreg 12.5 mg along with Tekturna HCT and diltiazem Does not feel lightheaded but his blood pressure is relatively low recently  HYPERLIPIDEMIA: The lipid abnormality consists of elevated LDL, on Lipitor for several years but because of high liver functions he has been taken off the 40 mg dose and changed to 10 mg by cardiologist, LDL appears relatively higher, recent follow-up done with cardiologist  Lab Results  Component Value Date   CHOL 167 12/10/2014   HDL 37.50* 12/10/2014   LDLCALC 108* 12/10/2014   TRIG 109.0 12/10/2014   CHOLHDL 4 12/10/2014    Renal insufficiency: His creatinine is better and consistently in the normal range now     Lab Results  Component Value Date   CREATININE 1.15 12/23/2014   Diabetic foot exam in 3/16      Examination:   BP 100/50 mmHg  Pulse 100  Temp(Src) 97.8 F (36.6 C)  Resp 16  Ht 5' 11" (1.803 m)  Wt 216 lb 12.8 oz (98.34 kg)  BMI 30.25 kg/m2  SpO2 96%  Body mass index is 30.25 kg/(m^2).    Standing blood pressure 100/50, sitting blood pressure previously was 116/50 No pedal edema  ASSESSMENT/ PLAN:   Hypertension: His blood pressure is relatively low and considering his age he does appear to have excessive orthostasis with current regimen of 4 antihypertensive drugs including HCTZ For now will stop his HCTZ and continue Tekturna by itself Since he does not have follow-up with cardiologist will have him come back in one month  Diabetes type 2 with mild obesity. His A1c has been over 8% although recently better but this may be related to his anemia See history of present illness for detailed  discussion of his current management, blood sugar patterns and problems identified He appears to have mostly high readings after supper and encouraged him to increase the suppertime dose by 2 units Also he is not checking blood sugars consistently especially after meals Discussed adjustment of Levemir based on fasting blood sugar pattern Also he will adjust his mealtime dose based on portion size and carbohydrate intake; discussed that his postprandial readings should be preferably under at least 180 at various meals Encouraged him to stay consistently active  For now we will have him continue his multidrug regimen including Victoza and basal bolus insulin  ANEMIA: He will discussed taking iron with his PCP  HYPERLIPIDEMIA: Consider increasing Lipitor to 20 mg, deferred to PCP   Patient Instructions  Check blood sugars on waking up ..3  .. times a week Also check blood sugars about 2 hours after a meal and do this after different meals by rotation  Recommended blood sugar levels  on waking up is 90-130 and about 2 hours after meal is 120-180 Please bring blood sugar monitor to each visit.  Adjust Levemir if am sugar stays > 140  Pm Novolog 16  At supper  Change Tekturna to without HCTZ   Counseling time on subjects discussed above is over 50% of today's 25 minute visit  Nolin Grell 12/26/2014, 12:49 PM

## 2014-12-26 NOTE — Patient Instructions (Addendum)
Check blood sugars on waking up ..3  .. times a week Also check blood sugars about 2 hours after a meal and do this after different meals by rotation  Recommended blood sugar levels on waking up is 90-130 and about 2 hours after meal is 120-180 Please bring blood sugar monitor to each visit.  Adjust Levemir if am sugar stays > 140  Pm Novolog 16  At supper  Change Tekturna to without HCTZ

## 2015-01-19 ENCOUNTER — Other Ambulatory Visit: Payer: Self-pay | Admitting: *Deleted

## 2015-01-19 MED ORDER — PIOGLITAZONE HCL 30 MG PO TABS: 30.0000 mg | ORAL_TABLET | Freq: Every day | ORAL | Status: DC

## 2015-01-20 ENCOUNTER — Ambulatory Visit: Payer: Medicare Other | Admitting: Physician Assistant

## 2015-01-20 ENCOUNTER — Other Ambulatory Visit: Payer: Medicare Other

## 2015-01-21 ENCOUNTER — Other Ambulatory Visit: Payer: Self-pay | Admitting: *Deleted

## 2015-01-21 DIAGNOSIS — C61 Malignant neoplasm of prostate: Secondary | ICD-10-CM

## 2015-01-21 MED ORDER — ENZALUTAMIDE 40 MG PO CAPS: 160.0000 mg | ORAL_CAPSULE | Freq: Every day | ORAL | Status: DC

## 2015-01-21 NOTE — Telephone Encounter (Signed)
Refill: [facility] phone in

## 2015-01-23 ENCOUNTER — Other Ambulatory Visit (HOSPITAL_BASED_OUTPATIENT_CLINIC_OR_DEPARTMENT_OTHER): Payer: Medicare Other

## 2015-01-23 ENCOUNTER — Ambulatory Visit (INDEPENDENT_AMBULATORY_CARE_PROVIDER_SITE_OTHER): Payer: Medicare Other | Admitting: Endocrinology

## 2015-01-23 ENCOUNTER — Encounter: Payer: Self-pay | Admitting: Physician Assistant

## 2015-01-23 ENCOUNTER — Encounter: Payer: Self-pay | Admitting: Endocrinology

## 2015-01-23 ENCOUNTER — Telehealth: Payer: Self-pay | Admitting: Physician Assistant

## 2015-01-23 ENCOUNTER — Ambulatory Visit (HOSPITAL_BASED_OUTPATIENT_CLINIC_OR_DEPARTMENT_OTHER): Payer: Medicare Other | Admitting: Physician Assistant

## 2015-01-23 ENCOUNTER — Other Ambulatory Visit: Payer: Self-pay | Admitting: *Deleted

## 2015-01-23 VITALS — BP 118/60 | HR 109 | Temp 98.5°F | Resp 16 | Ht 71.0 in | Wt 214.0 lb

## 2015-01-23 VITALS — BP 100/79 | HR 107 | Temp 98.1°F | Resp 17 | Ht 71.0 in | Wt 217.9 lb

## 2015-01-23 DIAGNOSIS — D649 Anemia, unspecified: Secondary | ICD-10-CM

## 2015-01-23 DIAGNOSIS — C7951 Secondary malignant neoplasm of bone: Secondary | ICD-10-CM

## 2015-01-23 DIAGNOSIS — E291 Testicular hypofunction: Secondary | ICD-10-CM

## 2015-01-23 DIAGNOSIS — I10 Essential (primary) hypertension: Secondary | ICD-10-CM | POA: Diagnosis not present

## 2015-01-23 DIAGNOSIS — IMO0002 Reserved for concepts with insufficient information to code with codable children: Secondary | ICD-10-CM

## 2015-01-23 DIAGNOSIS — C61 Malignant neoplasm of prostate: Secondary | ICD-10-CM

## 2015-01-23 DIAGNOSIS — E1165 Type 2 diabetes mellitus with hyperglycemia: Secondary | ICD-10-CM | POA: Diagnosis not present

## 2015-01-23 LAB — COMPREHENSIVE METABOLIC PANEL (CC13)
ALBUMIN: 2.5 g/dL — AB (ref 3.5–5.0)
ALT: 10 U/L (ref 0–55)
ANION GAP: 10 meq/L (ref 3–11)
AST: 12 U/L (ref 5–34)
Alkaline Phosphatase: 309 U/L — ABNORMAL HIGH (ref 40–150)
BUN: 26.4 mg/dL — ABNORMAL HIGH (ref 7.0–26.0)
CALCIUM: 9.2 mg/dL (ref 8.4–10.4)
CHLORIDE: 109 meq/L (ref 98–109)
CO2: 19 mEq/L — ABNORMAL LOW (ref 22–29)
Creatinine: 1.3 mg/dL (ref 0.7–1.3)
EGFR: 62 mL/min/{1.73_m2} — ABNORMAL LOW (ref >90)
GLUCOSE: 129 mg/dL (ref 70–140)
Potassium: 4.6 mEq/L (ref 3.5–5.1)
Sodium: 139 mEq/L (ref 136–145)
TOTAL PROTEIN: 7 g/dL (ref 6.4–8.3)
Total Bilirubin: 0.2 mg/dL (ref 0.20–1.20)

## 2015-01-23 LAB — VITAMIN B12: VITAMIN B 12: 1234 pg/mL — AB (ref 211–911)

## 2015-01-23 LAB — IRON AND TIBC CHCC
%SAT: 10 % — ABNORMAL LOW (ref 20–55)
Iron: 23 ug/dL — ABNORMAL LOW (ref 42–163)
TIBC: 219 ug/dL (ref 202–409)
UIBC: 196 ug/dL (ref 117–376)

## 2015-01-23 LAB — CBC WITH DIFFERENTIAL/PLATELET
BASO%: 0.8 % (ref 0.0–2.0)
BASOS ABS: 0.1 10*3/uL (ref 0.0–0.1)
EOS ABS: 0.1 10*3/uL (ref 0.0–0.5)
EOS%: 1.1 % (ref 0.0–7.0)
HCT: 28.5 % — ABNORMAL LOW (ref 38.4–49.9)
HGB: 9.2 g/dL — ABNORMAL LOW (ref 13.0–17.1)
LYMPH%: 18.9 % (ref 14.0–49.0)
MCH: 28 pg (ref 27.2–33.4)
MCHC: 32.2 g/dL (ref 32.0–36.0)
MCV: 87 fL (ref 79.3–98.0)
MONO#: 0.7 10*3/uL (ref 0.1–0.9)
MONO%: 10.4 % (ref 0.0–14.0)
NEUT#: 4.3 10*3/uL (ref 1.5–6.5)
NEUT%: 68.8 % (ref 39.0–75.0)
Platelets: 428 10*3/uL — ABNORMAL HIGH (ref 140–400)
RBC: 3.27 10*6/uL — AB (ref 4.20–5.82)
RDW: 19.3 % — ABNORMAL HIGH (ref 11.0–14.6)
WBC: 6.3 10*3/uL (ref 4.0–10.3)
lymph#: 1.2 10*3/uL (ref 0.9–3.3)

## 2015-01-23 LAB — FERRITIN CHCC: FERRITIN: 514 ng/mL — AB (ref 22–316)

## 2015-01-23 MED ORDER — ENZALUTAMIDE 40 MG PO CAPS: 160.0000 mg | ORAL_CAPSULE | Freq: Every day | ORAL | Status: DC

## 2015-01-23 NOTE — Progress Notes (Signed)
Patient ID: Jeremy Jennings, male   DOB: [birth year], 79 y.o.   MRN: [id number]   Reason for Appointment:  follow-up of various issues  History of Present Illness    HYPERTENSION:   followed by cardiologist.   Now he is taking Coreg 12.5 mg along with Tekturna and diltiazem Because of his blood pressure of only 100/60 standing up his HCTZ was stopped He was asymptomatic He says his blood pressure at home was about 786 systolic last night    Type 2 DIABETES MELITUS, date of diagnosis:  1981        PREVIOUS history: He has had long-standing diabetes treated with multiple drugs over the years and still continues to take a multidrug regimen He was started on Victoza in 2010 somewhat better diabetes control and better weight However because of tendency to high postprandial readings and higher A1c was also given mealtime coverage with NovoLog about 2 years ago  Because of his A1c going up to 8.3 in 03/2013 his Victoza was increased from 1.2 up to 1.8 mg Because of worsening blood sugar control and A1c of 8.6% he was started on Levemir insulin in 02/2014  RECENT history:  Insulin regimen: 10-12 before acb acl; of NovoLog, 14 acs Levemir 14 hs   He is on a basal bolus insulin regimen along with Actos and metformin He has somewhat better blood sugars in the evenings compared to last time No recent hypoglycemia He thinks he is compliant with his insulin at all meals  A1c is relatively better  He has breakfast between 8-10 AM and dinner usually 6 PM  Oral hypoglycemic drugs: Actos, metformin bid     Side effects from medications: None       Proper timing of medications in relation to meals: Yes.          Monitors blood glucose:  recently less than 1 times a day.    Glucometer:  FreeStyle         Blood Glucose readings were reviewed and are averaging 140 and highest average of about 150 late evening and overall range 78-190   Physical activity: exercise: Some farming; walking at  mall           Dietician visit: Most recent: 7/67           Complications: are: None     Wt Readings from Last 3 Encounters:  01/23/15 214 lb (97.07 kg)  12/26/14 216 lb 12.8 oz (98.34 kg)  12/23/14 214 lb 8 oz (97.297 kg)   LABS:   Lab Results  Component Value Date   HGBA1C 7.4* 12/23/2014   HGBA1C 8.3* 09/22/2014   HGBA1C 8.4* 06/23/2014   Lab Results  Component Value Date   MICROALBUR 3.0* 12/26/2014   LDLCALC 108* 12/10/2014   CREATININE 1.15 12/23/2014    No visits with results within 1 Week(s) from this visit. Latest known visit with results is:  Office Visit on 12/26/2014  Component Date Value Ref Range Status  . Color, Urine 12/26/2014 YELLOW  Yellow;Lt. Yellow Final  . APPearance 12/26/2014 CLEAR  Clear Final  . Specific Gravity, Urine 12/26/2014 1.020  1.000-1.030 Final  . pH 12/26/2014 6.0  5.0 - 8.0 Final  . Total Protein, Urine 12/26/2014 NEGATIVE  Negative Final  . Urine Glucose 12/26/2014 NEGATIVE  Negative Final  . Ketones, ur 12/26/2014 NEGATIVE  Negative Final  . Bilirubin Urine 12/26/2014 NEGATIVE  Negative Final  . Hgb urine dipstick 12/26/2014 NEGATIVE  Negative Final  .  Urobilinogen, UA 12/26/2014 0.2  0.0 - 1.0 Final  . Leukocytes, UA 12/26/2014 NEGATIVE  Negative Final  . Nitrite 12/26/2014 NEGATIVE  Negative Final  . Microalb, Ur 12/26/2014 3.0* 0.0 - 1.9 mg/dL Final  . Creatinine,U 12/26/2014 96.3   Final  . Microalb Creat Ratio 12/26/2014 3.1  0.0 - 30.0 mg/g Final      Medication List       This list is accurate as of: 01/23/15  9:35 AM.  Always use your most recent med list.               ACCU-CHEK AVIVA PLUS W/DEVICE Kit  Use to check blood sugar 2 times per day dx code E11.65     ACCU-CHEK SOFTCLIX LANCETS lancets  Use as instructed to check blood sugar 2 times per day dx code E11.65     aliskiren 150 MG tablet  Commonly known as:  TEKTURNA  Take 1 tablet (150 mg total) by mouth daily.     atorvastatin 10 MG tablet    Commonly known as:  LIPITOR  take 1 tablet by mouth once daily     carvedilol 12.5 MG tablet  Commonly known as:  COREG  Take 1 tablet (12.5 mg total) by mouth 2 (two) times daily with a meal.     COMBIGAN 0.2-0.5 % ophthalmic solution  Generic drug:  brimonidine-timolol  Place 1 drop into the right eye daily.     diltiazem 360 MG 24 hr capsule  Commonly known as:  TIAZAC  Take 360 mg by mouth daily.     docusate sodium 100 MG capsule  Commonly known as:  COLACE  Take 200 mg by mouth daily.     enzalutamide 40 MG capsule  Commonly known as:  XTANDI  Take 4 capsules (160 mg total) by mouth daily.     glucose blood test strip  Commonly known as:  ACCU-CHEK AVIVA PLUS  Use as instructed to check blood sugar 2 times per day dx code E11.65     insulin aspart 100 UNIT/ML FlexPen  Commonly known as:  NOVOLOG FLEXPEN  Inject 12 units 3 times per day with meals     Insulin Detemir 100 UNIT/ML Pen  Commonly known as:  LEVEMIR  Inject 12 Units into the skin daily at 10 pm.     Insulin Pen Needle 31G X 5 MM Misc  Commonly known as:  B-D UF III MINI PEN NEEDLES  1 each by Does not apply route 2 (two) times daily.     latanoprost 0.005 % ophthalmic solution  Commonly known as:  XALATAN  Place 0.005 % into the left eye at bedtime.     Liraglutide 18 MG/3ML Sopn  Inject 1.8 mg into the skin daily.     megestrol 20 MG tablet  Commonly known as:  MEGACE  Take 20 mg by mouth 2 (two) times daily.     metFORMIN 500 MG tablet  Commonly known as:  GLUCOPHAGE  Take 1 tablet (500 mg total) by mouth 3 (three) times daily.     oxyCODONE-acetaminophen 5-325 MG per tablet  Commonly known as:  PERCOCET/ROXICET  Take 1 tablet by mouth every 4 (four) hours as needed for severe pain.     pioglitazone 30 MG tablet  Commonly known as:  ACTOS  Take 1 tablet (30 mg total) by mouth daily.     prednisoLONE acetate 1 % ophthalmic suspension  Commonly known as:  PRED FORTE  Place 1 drop into  both eyes 2 (two) times daily.     tamsulosin 0.4 MG Caps capsule  Commonly known as:  FLOMAX  Take 0.4 mg by mouth daily.     VITAMIN D PO  Take 5,000 Units by mouth daily. Taking 5000 daily        Allergies: No Known Allergies  Past Medical History  Diagnosis Date  . Hypertension   . Diabetes mellitus   . Heart murmur     SYSTOLIC MURMUR AT BASE  . Hyperlipidemia   . Prostate cancer     Past Surgical History  Procedure Laterality Date  . Tonsillectomy      IN [facility]  . Hemorroidectomy  1969  . Hernia repair  AFE 16-17  . Hernia repair  1981    [facility]    Family History  Problem Relation Age of Onset  . Cancer Neg Hx   . Kidney failure Brother   . Kidney failure Sister     Social History:  reports that he quit smoking about 35 years ago. His smoking use included Cigarettes. He has a 5 pack-year smoking history. He has never used smokeless tobacco. He reports that he does not drink alcohol or use illicit drugs.  Review of Systems:  Previously noted problems:  HYPERLIPIDEMIA: The lipid abnormality consists of elevated LDL, on Lipitor for several years but because of high liver functions he has been taken off the 40 mg dose and changed to 10 mg by cardiologist, LDL appears relatively higher, recent follow-up done with cardiologist  Lab Results  Component Value Date   CHOL 167 12/10/2014   HDL 37.50* 12/10/2014   LDLCALC 108* 12/10/2014   TRIG 109.0 12/10/2014   CHOLHDL 4 12/10/2014    Renal insufficiency: His creatinine is better and consistently in the normal range     Lab Results  Component Value Date   CREATININE 1.15 12/23/2014   Diabetic foot exam in 3/16      Examination:   BP 118/60 mmHg  Pulse 109  Temp(Src) 98.5 F (36.9 C)  Resp 16  Ht 5' 11"  (1.803 m)  Wt 214 lb (97.07 kg)  BMI 29.86 kg/m2  SpO2 97%  Body mass index is 29.86 kg/(m^2).    Standing blood pressure 118/58    ASSESSMENT/ PLAN:   Hypertension: His blood pressure  is better with stopping HCTZ and he will continue the same regimen  Sinus tachycardia: Unclear of etiology and will defer to cardiologist May consider increasing dose of carvedilol   ANEMIA: He is taking iron OTC.  Will also check stool Hemoccult as he has not done this and has not done colonoscopy since 2010  Diabetes type 2 with mild obesity. See history of present illness for detailed discussion of his current management, blood sugar patterns and problems identified   HYPERLIPIDEMIA: Consider increasing Lipitor to 20 mg, deferred to PCP   Patient Instructions  Check blood sugars on waking up .Marland Kitchen 2-3 .Marland Kitchen times a week Also check blood sugars about 2 hours after a meal and do this after different meals by rotation  Recommended blood sugar levels on waking up is 90-130 and about 2 hours after meal is 140-180 Please bring blood sugar monitor to each visit.     Rosalinda Seaman 01/23/2015, 9:35 AM

## 2015-01-23 NOTE — Telephone Encounter (Signed)
per pof to sch pt appt-gave pt copy of avs °

## 2015-01-23 NOTE — Patient Instructions (Signed)
Check blood sugars on waking up .. 2-3 .. times a week  Also check blood sugars about 2 hours after a meal and do this after different meals by rotation  Recommended blood sugar levels on waking up is 90-130 and about 2 hours after meal is 140-180 Please bring blood sugar monitor to each visit.  

## 2015-01-23 NOTE — Progress Notes (Signed)
Hematology and Oncology Follow Up Visit  Jeremy Jennings [id number] [birth year] 79 y.o. 01/23/2015 2:40 PM [name], MDBrackbill, [name], MD   Principle Diagnosis: 80 year old gentleman diagnosed with prostate cancer in April 2015. He presented with a PSA of 30 and a Gleason score 5+4 = 9. Staging workup showed metastatic bony disease with some lymphadenopathy.   Prior Therapy:  He was treated with androgen deprivation under the care of [name] with a PSA nadir close to 1.8. Most recently his PSA was increased up to 18 and repeat a bone scan showed progression of his disease indicating castration resistant disease.  Current therapy:  He is currently receiving androgen deprivation as well as monthly Xgeva. Xtandi 160 mg started in May 2016.  Interim History: Jeremy Jennings presents today for a follow-up visit. Since the last visit, he started [place] and took a whole month without any complications. He continues to tolerate the extending without difficulty. He was seen by his primary care physician, Dr. Dwyane Dee who is currently checking his stools for any evidence of occult blood. He is concerned about his level of anemia and is wondering whether is coming from perhaps a B 12 deficiency. He would like our office check and 4 B-12 deficiency. He did have a blood transfusion in May of this year. He denies any bloody or tarry stools. Is not having any nosebleeds no vomiting of blood or blood in his urine. He denied any nausea or abdominal pain. He denied any lower extremity edema or constitutional symptoms. He continues to be active and performs activities of daily living. he is feeling well. He reports very little to no pain at this time. Denies diffuse bony pain. He does not report any pathological fractures.  He does not report any constitutional symptoms of weight loss or appetite changes. He did not report any deterioration in his performance status or activity level. He does not report any  shortness of breath or hemoptysis. He does not report any nausea, vomiting and his abdominal pain resolved. He does not report any frequency urgency or hesitancy. The remaining review of systems unremarkable.  Medications: I have reviewed the patient's current medications.  Current Outpatient Prescriptions  Medication Sig Dispense Refill  . ACCU-CHEK SOFTCLIX LANCETS lancets Use as instructed to check blood sugar 2 times per day dx code E11.65 100 each 3  . aliskiren (TEKTURNA) 150 MG tablet Take 1 tablet (150 mg total) by mouth daily. 30 tablet 5  . atorvastatin (LIPITOR) 10 MG tablet take 1 tablet by mouth once daily 30 tablet 5  . Blood Glucose Monitoring Suppl (ACCU-CHEK AVIVA PLUS) W/DEVICE KIT Use to check blood sugar 2 times per day dx code E11.65 1 kit 0  . carvedilol (COREG) 12.5 MG tablet Take 1 tablet (12.5 mg total) by mouth 2 (two) times daily with a meal. 60 tablet 5  . Cholecalciferol (VITAMIN D PO) Take 5,000 Units by mouth daily. Taking 5000 daily    . COMBIGAN 0.2-0.5 % ophthalmic solution Place 1 drop into the right eye daily.     Marland Kitchen diltiazem (TIAZAC) 360 MG 24 hr capsule Take 360 mg by mouth daily.      Marland Kitchen docusate sodium (COLACE) 100 MG capsule Take 200 mg by mouth daily.    . enzalutamide (XTANDI) 40 MG capsule Take 4 capsules (160 mg total) by mouth daily. 120 capsule 0  . glucose blood (ACCU-CHEK AVIVA PLUS) test strip Use as instructed to check blood sugar 2 times per day dx  code E11.65 100 each 3  . insulin aspart (NOVOLOG FLEXPEN) 100 UNIT/ML FlexPen Inject 12 units 3 times per day with meals 15 mL 3  . Insulin Detemir (LEVEMIR) 100 UNIT/ML Pen Inject 12 Units into the skin daily at 10 pm.    . Insulin Pen Needle (B-D UF III MINI PEN NEEDLES) 31G X 5 MM MISC 1 each by Does not apply route 2 (two) times daily. 100 each 11  . latanoprost (XALATAN) 0.005 % ophthalmic solution Place 0.005 % into the left eye at bedtime.     . Liraglutide 18 MG/3ML SOPN Inject 1.8 mg into the  skin daily. 9 pen 1  . megestrol (MEGACE) 20 MG tablet Take 20 mg by mouth 2 (two) times daily.    . metFORMIN (GLUCOPHAGE) 500 MG tablet Take 1 tablet (500 mg total) by mouth 3 (three) times daily. (Patient taking differently: Take 500 mg by mouth 3 (three) times daily. Takes 1 in am and 2 in pm) 270 tablet 1  . oxyCODONE-acetaminophen (PERCOCET/ROXICET) 5-325 MG per tablet Take 1 tablet by mouth every 4 (four) hours as needed for severe pain. 30 tablet 0  . pioglitazone (ACTOS) 30 MG tablet Take 1 tablet (30 mg total) by mouth daily. 90 tablet 1  . prednisoLONE acetate (PRED FORTE) 1 % ophthalmic suspension Place 1 drop into both eyes 2 (two) times daily.     . tamsulosin (FLOMAX) 0.4 MG CAPS capsule Take 0.4 mg by mouth daily.      No current facility-administered medications for this visit.     Allergies: No Known Allergies  Past Medical History, Surgical history, Social history, and Family History were reviewed and updated.   Physical Exam: Blood pressure 100/79, pulse 107, temperature 98.1 F (36.7 C), temperature source Oral, resp. rate 17, height 5' 11" (1.803 m), weight 217 lb 14.4 oz (98.839 kg), SpO2 100 %. ECOG: 0 General appearance: alert and cooperative not in any distress. Head: Normocephalic, without obvious abnormality Neck: no adenopathy Lymph nodes: Cervical, supraclavicular, and axillary nodes normal. Heart:regular rate and rhythm, S1, S2 normal, no murmur, click, rub or gallop Lung:chest clear, no wheezing, rales, normal symmetric air entry Abdomin: soft, non-tender, without masses or organomegaly EXT:no erythema, induration, or nodules   Lab Results: Lab Results  Component Value Date   WBC 6.3 01/23/2015   HGB 9.2* 01/23/2015   HCT 28.5* 01/23/2015   MCV 87.0 01/23/2015   PLT 428* 01/23/2015     Chemistry      Component Value Date/Time   NA 139 01/23/2015 1144   NA 135 12/23/2014 0842   K 4.6 01/23/2015 1144   K 4.1 12/23/2014 0842   CL 105  12/23/2014 0842   CO2 19* 01/23/2015 1144   CO2 24 12/23/2014 0842   BUN 26.4* 01/23/2015 1144   BUN 26* 12/23/2014 0842   CREATININE 1.3 01/23/2015 1144   CREATININE 1.15 12/23/2014 0842      Component Value Date/Time   CALCIUM 9.2 01/23/2015 1144   CALCIUM 8.6 12/23/2014 0842   ALKPHOS 309* 01/23/2015 1144   ALKPHOS 411* 12/10/2014 1013   AST 12 01/23/2015 1144   AST 12 12/10/2014 1013   ALT 10 01/23/2015 1144   ALT 9 12/10/2014 1013   BILITOT 0.20 01/23/2015 1144   BILITOT 0.2 12/10/2014 1013          Impression and Plan:   79 year old gentleman with the following issues:  1. Castration resistant metastatic prostate cancer with disease to the bone.  His initial diagnosis was in April 2015 with a PSA of 30 and a Gleason score of 5+4 = 9. After initial response with androgen deprivation, he developed castration resistant disease with clear progression by bone scan criteria noted on his recent bone scan on 10/29/2014. He also had a CT scan of the abdomen on 11/09/2014 which did not show any evidence of clear-cut visceral metastasis.  He is currently on Xtandi since May of 2016 and tolerated it well. His PSA is currently pending from today. He is tolerating the Xtandi without any complications. The plan is to continue with the same dose and schedule.  2. Androgen depravation:Dr. Alen Blew has recommended continuing that indefinitely.  3. Bone directed therapy: Dr. Alen Blew has recommended to continue Xgeva on a monthly basis as he is doing.  4. Anemia: Patient's hemoglobin is 9.2 g/dL today. We will check B-12 as well as iron studies and have the results sent to his primary care doctor, Dr. Dwyane Dee to further his evaluation of the patient's anemia in addition to his evaluation of the patient's stool for occult blood.  5. Follow-up: Will be in about 4 to 5 weeks to assess complications associated with this medication.   Carlton Adam, PA-C  7/15/20162:40 PM

## 2015-01-23 NOTE — Telephone Encounter (Signed)
Refilled patient's xtandi per Awilda Metro. E-scribed to [facility]

## 2015-01-24 LAB — PSA: PSA: 20.86 ng/mL — AB (ref <=4.00)

## 2015-01-26 ENCOUNTER — Other Ambulatory Visit: Payer: Self-pay | Admitting: Oncology

## 2015-01-27 ENCOUNTER — Other Ambulatory Visit: Payer: Self-pay | Admitting: *Deleted

## 2015-01-29 NOTE — Patient Instructions (Signed)
Continue Xtandi at the current dose Follow up in one month 

## 2015-02-19 ENCOUNTER — Ambulatory Visit (HOSPITAL_BASED_OUTPATIENT_CLINIC_OR_DEPARTMENT_OTHER): Payer: Medicare Other | Admitting: Oncology

## 2015-02-19 ENCOUNTER — Other Ambulatory Visit: Payer: Self-pay | Admitting: *Deleted

## 2015-02-19 ENCOUNTER — Telehealth: Payer: Self-pay | Admitting: Oncology

## 2015-02-19 ENCOUNTER — Other Ambulatory Visit (HOSPITAL_BASED_OUTPATIENT_CLINIC_OR_DEPARTMENT_OTHER): Payer: Medicare Other

## 2015-02-19 VITALS — BP 122/52 | HR 100 | Temp 98.6°F | Resp 18 | Ht 71.0 in | Wt 215.1 lb

## 2015-02-19 DIAGNOSIS — E291 Testicular hypofunction: Secondary | ICD-10-CM

## 2015-02-19 DIAGNOSIS — C7951 Secondary malignant neoplasm of bone: Secondary | ICD-10-CM | POA: Diagnosis not present

## 2015-02-19 DIAGNOSIS — C61 Malignant neoplasm of prostate: Secondary | ICD-10-CM

## 2015-02-19 DIAGNOSIS — D509 Iron deficiency anemia, unspecified: Secondary | ICD-10-CM | POA: Diagnosis not present

## 2015-02-19 LAB — COMPREHENSIVE METABOLIC PANEL (CC13)
ALBUMIN: 2.3 g/dL — AB (ref 3.5–5.0)
ALT: 13 U/L (ref 0–55)
ANION GAP: 7 meq/L (ref 3–11)
AST: 11 U/L (ref 5–34)
Alkaline Phosphatase: 343 U/L — ABNORMAL HIGH (ref 40–150)
BUN: 26.2 mg/dL — ABNORMAL HIGH (ref 7.0–26.0)
CHLORIDE: 109 meq/L (ref 98–109)
CO2: 21 mEq/L — ABNORMAL LOW (ref 22–29)
Calcium: 8.3 mg/dL — ABNORMAL LOW (ref 8.4–10.4)
Creatinine: 1.1 mg/dL (ref 0.7–1.3)
EGFR: 74 mL/min/{1.73_m2} — ABNORMAL LOW (ref >90)
Glucose: 142 mg/dl — ABNORMAL HIGH (ref 70–140)
POTASSIUM: 4.8 meq/L (ref 3.5–5.1)
Sodium: 137 mEq/L (ref 136–145)
Total Protein: 6.7 g/dL (ref 6.4–8.3)

## 2015-02-19 LAB — CBC WITH DIFFERENTIAL/PLATELET
BASO%: 0.3 % (ref 0.0–2.0)
BASOS ABS: 0 10*3/uL (ref 0.0–0.1)
EOS ABS: 0 10*3/uL (ref 0.0–0.5)
EOS%: 0.7 % (ref 0.0–7.0)
HCT: 25.8 % — ABNORMAL LOW (ref 38.4–49.9)
HGB: 8.3 g/dL — ABNORMAL LOW (ref 13.0–17.1)
LYMPH%: 14.9 % (ref 14.0–49.0)
MCH: 28.2 pg (ref 27.2–33.4)
MCHC: 32.2 g/dL (ref 32.0–36.0)
MCV: 87.8 fL (ref 79.3–98.0)
MONO#: 0.6 10*3/uL (ref 0.1–0.9)
MONO%: 9.8 % (ref 0.0–14.0)
NEUT%: 74.3 % (ref 39.0–75.0)
NEUTROS ABS: 4.4 10*3/uL (ref 1.5–6.5)
PLATELETS: 386 10*3/uL (ref 140–400)
RBC: 2.94 10*6/uL — ABNORMAL LOW (ref 4.20–5.82)
RDW: 17.3 % — ABNORMAL HIGH (ref 11.0–14.6)
WBC: 5.9 10*3/uL (ref 4.0–10.3)
lymph#: 0.9 10*3/uL (ref 0.9–3.3)

## 2015-02-19 MED ORDER — ENZALUTAMIDE 40 MG PO CAPS: 160.0000 mg | ORAL_CAPSULE | Freq: Every day | ORAL | Status: DC

## 2015-02-19 NOTE — Telephone Encounter (Signed)
per pof to sch pt appt-gave pt copy of avs °

## 2015-02-19 NOTE — Progress Notes (Signed)
Hematology and Oncology Follow Up Visit  Jeremy Jennings [id number] 12/04/1935 79 y.o. 02/19/2015 2:40 PM [name], MDBrackbill, [name], MD   Principle Diagnosis: 79 year old gentleman diagnosed with prostate cancer in April 2015. He presented with a PSA of 30 and a Gleason score 5+4 = 9. Staging workup showed metastatic bony disease with some lymphadenopathy.   Prior Therapy:  He was treated with androgen deprivation under the care of [name] with a PSA nadir close to 1.8. Most recently his PSA was increased up to 18 and repeat a bone scan showed progression of his disease indicating castration resistant disease.  Current therapy:  He is currently receiving androgen deprivation as well as monthly Xgeva. These are given at [facility]. Xtandi 160 mg started in May 2016.  Interim History: Jeremy Jennings presents today for a follow-up visit. Since the last visit, he continues to tolerate Xtandi without any complications. He did report some slight fatigue but this has not changed dramatically. He does not report any nausea or vomiting. Has not reported any lower extremity edema. He denies any bloody or tarry stools. Is not having any nosebleeds no vomiting of blood or blood in his urine. He denied any nausea or abdominal pain. He is taking oral iron daily without any complications. He denied any lower extremity edema or constitutional symptoms. He continues to be active and performs activities of daily living. he is feeling well. He reports very little to no pain at this time. Denies diffuse bony pain. He does not report any pathological fractures.  He does not report any constitutional symptoms of weight loss or appetite changes. He did not report any deterioration in his performance status or activity level. He does not report any shortness of breath or hemoptysis. He does not report any nausea, vomiting and his abdominal pain resolved. He does not report any frequency urgency or  hesitancy. The remaining review of systems unremarkable.  Medications: I have reviewed the patient's current medications.  Current Outpatient Prescriptions  Medication Sig Dispense Refill  . ACCU-CHEK SOFTCLIX LANCETS lancets Use as instructed to check blood sugar 2 times per day dx code E11.65 100 each 3  . aliskiren (TEKTURNA) 150 MG tablet Take 1 tablet (150 mg total) by mouth daily. 30 tablet 5  . atorvastatin (LIPITOR) 10 MG tablet take 1 tablet by mouth once daily 30 tablet 5  . Blood Glucose Monitoring Suppl (ACCU-CHEK AVIVA PLUS) W/DEVICE KIT Use to check blood sugar 2 times per day dx code E11.65 1 kit 0  . carvedilol (COREG) 12.5 MG tablet Take 1 tablet (12.5 mg total) by mouth 2 (two) times daily with a meal. 60 tablet 5  . Cholecalciferol (VITAMIN D PO) Take 5,000 Units by mouth daily. Taking 5000 daily    . COMBIGAN 0.2-0.5 % ophthalmic solution Place 1 drop into the right eye daily.     Marland Kitchen diltiazem (TIAZAC) 360 MG 24 hr capsule Take 360 mg by mouth daily.      Marland Kitchen docusate sodium (COLACE) 100 MG capsule Take 200 mg by mouth daily.    . enzalutamide (XTANDI) 40 MG capsule Take 4 capsules (160 mg total) by mouth daily. 120 capsule 0  . glucose blood (ACCU-CHEK AVIVA PLUS) test strip Use as instructed to check blood sugar 2 times per day dx code E11.65 100 each 3  . insulin aspart (NOVOLOG FLEXPEN) 100 UNIT/ML FlexPen Inject 12 units 3 times per day with meals 15 mL 3  . Insulin Detemir (LEVEMIR) 100  UNIT/ML Pen Inject 12 Units into the skin daily at 10 pm.    . Insulin Pen Needle (B-D UF III MINI PEN NEEDLES) 31G X 5 MM MISC 1 each by Does not apply route 2 (two) times daily. 100 each 11  . latanoprost (XALATAN) 0.005 % ophthalmic solution Place 0.005 % into the left eye at bedtime.     . Liraglutide 18 MG/3ML SOPN Inject 1.8 mg into the skin daily. 9 pen 1  . megestrol (MEGACE) 20 MG tablet Take 20 mg by mouth 2 (two) times daily.    . metFORMIN (GLUCOPHAGE) 500 MG tablet Take 1  tablet (500 mg total) by mouth 3 (three) times daily. (Patient taking differently: Take 500 mg by mouth 3 (three) times daily. Takes 1 in am and 2 in pm) 270 tablet 1  . oxyCODONE-acetaminophen (PERCOCET/ROXICET) 5-325 MG per tablet Take 1 tablet by mouth every 4 (four) hours as needed for severe pain. 30 tablet 0  . pioglitazone (ACTOS) 30 MG tablet Take 1 tablet (30 mg total) by mouth daily. 90 tablet 1  . prednisoLONE acetate (PRED FORTE) 1 % ophthalmic suspension Place 1 drop into both eyes 2 (two) times daily.     . tamsulosin (FLOMAX) 0.4 MG CAPS capsule Take 0.4 mg by mouth daily.      No current facility-administered medications for this visit.     Allergies: No Known Allergies  Past Medical History, Surgical history, Social history, and Family History were reviewed and updated.   Physical Exam: Blood pressure 122/52, pulse 100, temperature 98.6 F (37 C), temperature source Oral, resp. rate 18, height _0  (1.803 m), weight 215 lb 1.6 oz (97.569 kg), SpO2 100 %. ECOG: 0 General appearance: alert and cooperative appears comfortable without any distress. Head: Normocephalic, without obvious abnormality Neck: no adenopathy Lymph nodes: Cervical, supraclavicular, and axillary nodes normal. Heart:regular rate and rhythm, S1, S2 normal, no murmur, click, rub or gallop Lung:chest clear, no wheezing, rales, normal symmetric air entry. No wheezes or dullness to percussion. Abdomin: soft, non-tender, without masses or organomegaly EXT:no erythema, induration, or nodules   Lab Results: Lab Results  Component Value Date   WBC 5.9 02/19/2015   HGB 8.3* 02/19/2015   HCT 25.8* 02/19/2015   MCV 87.8 02/19/2015   PLT 386 02/19/2015     Chemistry      Component Value Date/Time   NA 139 01/23/2015 1144   NA 135 12/23/2014 0842   K 4.6 01/23/2015 1144   K 4.1 12/23/2014 0842   CL 105 12/23/2014 0842   CO2 19* 01/23/2015 1144   CO2 24 12/23/2014 0842   BUN 26.4* 01/23/2015 1144    BUN 26* 12/23/2014 0842   CREATININE 1.3 01/23/2015 1144   CREATININE 1.15 12/23/2014 0842      Component Value Date/Time   CALCIUM 9.2 01/23/2015 1144   CALCIUM 8.6 12/23/2014 0842   ALKPHOS 309* 01/23/2015 1144   ALKPHOS 411* 12/10/2014 1013   AST 12 01/23/2015 1144   AST 12 12/10/2014 1013   ALT 10 01/23/2015 1144   ALT 9 12/10/2014 1013   BILITOT 0.20 01/23/2015 1144   BILITOT 0.2 12/10/2014 1013     Results for UGO, THOMA (MRN [id number]) as of 02/19/2015 14:11  Ref. Range 01/23/2015 11:43  PSA Latest Ref Range: <=4.00 ng/mL 20.86 (H)    Results for LEVERETT, CAMPLIN (MRN [id number]) as of 02/19/2015 14:11  Ref. Range 01/23/2015 13:00  Iron Latest Ref Range: 42-163 ug/dL 23 (L)  UIBC Latest Ref Range: 117-376 ug/dL 196  TIBC Latest Ref Range: 202-409 ug/dL 219  %SAT Latest Ref Range: 20-55 % 10 (L)  Ferritin Latest Ref Range: 22-316 ng/ml 514 (H)     Impression and Plan:   79 year old gentleman with the following issues:  1. Castration resistant metastatic prostate cancer with disease to the bone. His initial diagnosis was in April 2015 with a PSA of 30 and a Gleason score of 5+4 = 9. After initial response with androgen deprivation, he developed castration resistant disease with clear progression by bone scan criteria noted on his recent bone scan on 10/29/2014. He also had a CT scan of the abdomen on 11/09/2014 which did not show any evidence of clear-cut visceral metastasis.  He is currently on Xtandi since May of 2016 and tolerated it well. His PSA is currently pending from today and last PSA was 20 in July 2016. The plan is to continue the same dose and schedule and follow his PSA periodically. He does not have any major complications associated with this medication.   2. Androgen depravation: This is to be continued indefinitely.  3. Bone directed therapy: recommended to continue Xgeva on a monthly basis as he is doing.  4. Anemia: Multifactorial in nature  but certainly has an element of iron deficiency. His iron is 23 which has improved from 18 between May and July 2016. His saturation is still low at 10%. He is currently taking oral iron at once daily. I have given him the option to increase the dose to twice a day versus IV iron. Risks and benefits of IV iron were discussed today and for the time being he elected to proceed with oral iron only. We'll continue to monitor this moving forward.   5. Follow-up: Will be in about 4 to 5 weeks to assess complications associated with this medication.   Zola Button, MD 8/11/20162:40 PM

## 2015-02-20 LAB — PSA: PSA: 27.51 ng/mL — AB (ref <=4.00)

## 2015-03-10 ENCOUNTER — Encounter: Payer: Self-pay | Admitting: *Deleted

## 2015-03-11 ENCOUNTER — Encounter: Payer: Self-pay | Admitting: *Deleted

## 2015-03-11 LAB — HM DIABETES EYE EXAM

## 2015-03-23 ENCOUNTER — Other Ambulatory Visit: Payer: Self-pay | Admitting: Endocrinology

## 2015-03-24 ENCOUNTER — Other Ambulatory Visit (HOSPITAL_BASED_OUTPATIENT_CLINIC_OR_DEPARTMENT_OTHER): Payer: Medicare Other

## 2015-03-24 ENCOUNTER — Telehealth: Payer: Self-pay | Admitting: Oncology

## 2015-03-24 ENCOUNTER — Ambulatory Visit (HOSPITAL_BASED_OUTPATIENT_CLINIC_OR_DEPARTMENT_OTHER): Payer: Medicare Other | Admitting: Oncology

## 2015-03-24 VITALS — BP 132/53 | HR 95 | Temp 98.2°F | Resp 17 | Ht 71.0 in | Wt 214.9 lb

## 2015-03-24 DIAGNOSIS — D649 Anemia, unspecified: Secondary | ICD-10-CM | POA: Diagnosis not present

## 2015-03-24 DIAGNOSIS — C7951 Secondary malignant neoplasm of bone: Secondary | ICD-10-CM | POA: Diagnosis not present

## 2015-03-24 DIAGNOSIS — C61 Malignant neoplasm of prostate: Secondary | ICD-10-CM

## 2015-03-24 DIAGNOSIS — E291 Testicular hypofunction: Secondary | ICD-10-CM | POA: Diagnosis not present

## 2015-03-24 LAB — COMPREHENSIVE METABOLIC PANEL (CC13)
ALK PHOS: 391 U/L — AB (ref 40–150)
ALT: 15 U/L (ref 0–55)
AST: 17 U/L (ref 5–34)
Albumin: 2.3 g/dL — ABNORMAL LOW (ref 3.5–5.0)
Anion Gap: 9 mEq/L (ref 3–11)
BUN: 22.3 mg/dL (ref 7.0–26.0)
CO2: 20 mEq/L — ABNORMAL LOW (ref 22–29)
Calcium: 7.9 mg/dL — ABNORMAL LOW (ref 8.4–10.4)
Chloride: 111 mEq/L — ABNORMAL HIGH (ref 98–109)
Creatinine: 0.9 mg/dL (ref 0.7–1.3)
EGFR: >90 mL/min/{1.73_m2} (ref >90)
GLUCOSE: 137 mg/dL (ref 70–140)
Potassium: 3.9 mEq/L (ref 3.5–5.1)
SODIUM: 140 meq/L (ref 136–145)
TOTAL PROTEIN: 6.6 g/dL (ref 6.4–8.3)

## 2015-03-24 LAB — CBC WITH DIFFERENTIAL/PLATELET
BASO%: 0.7 % (ref 0.0–2.0)
Basophils Absolute: 0 10*3/uL (ref 0.0–0.1)
EOS ABS: 0 10*3/uL (ref 0.0–0.5)
EOS%: 0.8 % (ref 0.0–7.0)
HEMATOCRIT: 26.1 % — AB (ref 38.4–49.9)
HGB: 8.4 g/dL — ABNORMAL LOW (ref 13.0–17.1)
LYMPH%: 15.5 % (ref 14.0–49.0)
MCH: 28.2 pg (ref 27.2–33.4)
MCHC: 32.2 g/dL (ref 32.0–36.0)
MCV: 87.6 fL (ref 79.3–98.0)
MONO#: 0.6 10*3/uL (ref 0.1–0.9)
MONO%: 10.8 % (ref 0.0–14.0)
NEUT%: 72.2 % (ref 39.0–75.0)
NEUTROS ABS: 4.3 10*3/uL (ref 1.5–6.5)
PLATELETS: 536 10*3/uL — AB (ref 140–400)
RBC: 2.98 10*6/uL — ABNORMAL LOW (ref 4.20–5.82)
RDW: 17.7 % — AB (ref 11.0–14.6)
WBC: 6 10*3/uL (ref 4.0–10.3)
lymph#: 0.9 10*3/uL (ref 0.9–3.3)

## 2015-03-24 NOTE — Progress Notes (Signed)
Hematology and Oncology Follow Up Visit  Jeremy Jennings [id number] Apr 15, 1936 79 y.o. 03/24/2015 3:08 PM Jeremy Jennings, MDBrackbill, Jeremy Moores, MD   Principle Diagnosis: 79 year old gentleman diagnosed with prostate cancer in April 2015. He presented with a PSA of 30 and a Gleason score 5+4 = 9. Staging workup showed metastatic bony disease with some lymphadenopathy.   Prior Therapy:  He was treated with androgen deprivation under the care of [name] with a PSA nadir close to 1.8. Most recently his PSA was increased up to 18 and repeat a bone scan showed progression of his disease indicating castration resistant disease.  Current therapy:  He is currently receiving androgen deprivation as well as monthly Xgeva. These are given at [facility]. Xtandi 160 mg started in May 2016.  Interim History: Jeremy Jennings presents today for a follow-up visit. Since the last visit, he reports no new complaints. He continues to tolerate Xtandi without any complications. He did report some slight fatigue but this has not changed dramatically. He is able to attend sporting activities without any decline. He does not report any nausea or vomiting. Has not reported any lower extremity edema. He denies any bloody or tarry stools. Is not having any nosebleeds no vomiting of blood or blood in his urine. He denied any nausea or abdominal pain. He is taking oral iron daily without any complications. He denied any lower extremity edema or constitutional symptoms. He continues to be active and performs activities of daily living. he is feeling well. He reports very little to no pain at this time. Denies diffuse bony pain. He does not report any pathological fractures.  He does not report any constitutional symptoms of weight loss or appetite changes. He did not report any deterioration in his performance status or activity level. He does not report any shortness of breath or hemoptysis. He does not report any nausea,  vomiting and his abdominal pain resolved. He does not report any frequency urgency or hesitancy. The remaining review of systems unremarkable.  Medications: I have reviewed the patient's current medications.  Current Outpatient Prescriptions  Medication Sig Dispense Refill  . ACCU-CHEK SOFTCLIX LANCETS lancets Use as instructed to check blood sugar 2 times per day dx code E11.65 100 each 3  . aliskiren (TEKTURNA) 150 MG tablet Take 1 tablet (150 mg total) by mouth daily. 30 tablet 5  . atorvastatin (LIPITOR) 10 MG tablet take 1 tablet by mouth once daily 30 tablet 5  . Blood Glucose Monitoring Suppl (ACCU-CHEK AVIVA PLUS) W/DEVICE KIT Use to check blood sugar 2 times per day dx code E11.65 1 kit 0  . carvedilol (COREG) 12.5 MG tablet Take 1 tablet (12.5 mg total) by mouth 2 (two) times daily with a meal. 60 tablet 5  . Cholecalciferol (VITAMIN D PO) Take 5,000 Units by mouth daily. Taking 5000 daily    . COMBIGAN 0.2-0.5 % ophthalmic solution Place 1 drop into the right eye daily.     Marland Kitchen diltiazem (TIAZAC) 360 MG 24 hr capsule Take 360 mg by mouth daily.      Marland Kitchen docusate sodium (COLACE) 100 MG capsule Take 200 mg by mouth daily.    . enzalutamide (XTANDI) 40 MG capsule Take 4 capsules (160 mg total) by mouth daily. 120 capsule 0  . glucose blood (ACCU-CHEK AVIVA PLUS) test strip Use as instructed to check blood sugar 2 times per day dx code E11.65 100 each 3  . insulin aspart (NOVOLOG FLEXPEN) 100 UNIT/ML FlexPen Inject 12 units  3 times per day with meals 15 mL 3  . Insulin Detemir (LEVEMIR) 100 UNIT/ML Pen Inject 12 Units into the skin daily at 10 pm.    . Insulin Pen Needle (B-D UF III MINI PEN NEEDLES) 31G X 5 MM MISC 1 each by Does not apply route 2 (two) times daily. 100 each 11  . latanoprost (XALATAN) 0.005 % ophthalmic solution Place 0.005 % into the left eye at bedtime.     Marland Kitchen LEVEMIR FLEXTOUCH 100 UNIT/ML Pen INEJCT 10 UNITS INTO SKIN DAILY AT 10 PM 15 mL 2  . Liraglutide 18 MG/3ML SOPN  Inject 1.8 mg into the skin daily. 9 pen 1  . megestrol (MEGACE) 20 MG tablet Take 20 mg by mouth 2 (two) times daily.    . metFORMIN (GLUCOPHAGE) 500 MG tablet Take 1 tablet (500 mg total) by mouth 3 (three) times daily. (Patient taking differently: Take 500 mg by mouth 3 (three) times daily. Takes 1 in am and 2 in pm) 270 tablet 1  . oxyCODONE-acetaminophen (PERCOCET/ROXICET) 5-325 MG per tablet Take 1 tablet by mouth every 4 (four) hours as needed for severe pain. 30 tablet 0  . pioglitazone (ACTOS) 30 MG tablet Take 1 tablet (30 mg total) by mouth daily. 90 tablet 1  . prednisoLONE acetate (PRED FORTE) 1 % ophthalmic suspension Place 1 drop into both eyes 2 (two) times daily.     . tamsulosin (FLOMAX) 0.4 MG CAPS capsule Take 0.4 mg by mouth daily.      No current facility-administered medications for this visit.     Allergies: No Known Allergies  Past Medical History, Surgical history, Social history, and Family History were reviewed and updated.   Physical Exam: Blood pressure 132/53, pulse 95, temperature 98.2 F (36.8 C), temperature source Oral, resp. rate 17, height 5' 11"  (1.803 m), weight 214 lb 14.4 oz (97.478 kg), SpO2 100 %. ECOG: 0 General appearance: alert and cooperative without any distress ambulating without any difficulties. Head: Normocephalic, without obvious abnormality Neck: no adenopathy Lymph nodes: Cervical, supraclavicular, and axillary nodes normal. Heart:regular rate and rhythm, S1, S2 normal, no murmur, click, rub or gallop Lung:chest clear, no wheezing, rales, normal symmetric air entry. No wheezes or dullness to percussion. Abdomin: soft, non-tender, without masses or organomegaly EXT:no erythema, induration, or nodules   Lab Results: Lab Results  Component Value Date   WBC 6.0 03/24/2015   HGB 8.4* 03/24/2015   HCT 26.1* 03/24/2015   MCV 87.6 03/24/2015   PLT 536* 03/24/2015     Chemistry      Component Value Date/Time   NA 137 02/19/2015  1419   NA 135 12/23/2014 0842   K 4.8 02/19/2015 1419   K 4.1 12/23/2014 0842   CL 105 12/23/2014 0842   CO2 21* 02/19/2015 1419   CO2 24 12/23/2014 0842   BUN 26.2* 02/19/2015 1419   BUN 26* 12/23/2014 0842   CREATININE 1.1 02/19/2015 1419   CREATININE 1.15 12/23/2014 0842      Component Value Date/Time   CALCIUM 8.3* 02/19/2015 1419   CALCIUM 8.6 12/23/2014 0842   ALKPHOS 343* 02/19/2015 1419   ALKPHOS 411* 12/10/2014 1013   AST 11 02/19/2015 1419   AST 12 12/10/2014 1013   ALT 13 02/19/2015 1419   ALT 9 12/10/2014 1013   BILITOT <0.20 02/19/2015 1419   BILITOT 0.2 12/10/2014 1013      Results for HONOR, [facility] (MRN [id number]) as of 03/24/2015 15:01  Ref. Range 01/23/2015 11:43  02/19/2015 14:19  PSA Latest Ref Range: <=4.00 ng/mL 20.86 (H) 27.51 (H)      Impression and Plan:   79 year old gentleman with the following issues:  1. Castration resistant metastatic prostate cancer with disease to the bone. His initial diagnosis was in April 2015 with a PSA of 30 and a Gleason score of 5+4 = 9. After initial response with androgen deprivation, he developed castration resistant disease with clear progression by bone scan criteria noted on his recent bone scan on 10/29/2014. He also had a CT scan of the abdomen on 11/09/2014 which did not show any evidence of clear-cut visceral metastasis.  He is currently on Xtandi since May of 2016 and tolerated it well. His PSA is relatively stable but have actually increased up to 27 from 18. His PSA is currently pending from today. If his PSA continues to rise, I will switch him to Zytiga at this time. Risks and benefits of this medication was reviewed today these complications include fatigue, tiredness, electrolyte imbalance among others were reviewed. If his PSA drops down then we'll continue Xtandi.  2. Androgen depravation: This is to be continued indefinitely.  3. Bone directed therapy: recommended to continue Xgeva on a monthly  basis as he is doing.  4. Anemia: Multifactorial in nature but certainly has an element of iron deficiency. His hemoglobin continues to be stable but not improving much with iron supplements. I have offered him IV iron in the past and he has deferred. I will continue to monitor this on a monthly basis. He does not require any transfusion or growth factor support at this time.  5. Follow-up: Will be in 4 weeks.   [facility], MD 9/13/20163:08 PM

## 2015-03-24 NOTE — Telephone Encounter (Signed)
per pof to sch pt appt-gave pt copy of avs °

## 2015-03-25 ENCOUNTER — Encounter: Payer: Self-pay | Admitting: *Deleted

## 2015-03-25 ENCOUNTER — Encounter: Payer: Self-pay | Admitting: Oncology

## 2015-03-25 ENCOUNTER — Telehealth: Payer: Self-pay | Admitting: *Deleted

## 2015-03-25 LAB — PSA: PSA: 45.58 ng/mL — ABNORMAL HIGH (ref <=4.00)

## 2015-03-25 MED ORDER — ABIRATERONE ACETATE 250 MG PO TABS: 1000.0000 mg | ORAL_TABLET | Freq: Every day | ORAL | Status: DC

## 2015-03-25 NOTE — Telephone Encounter (Signed)
As noted below by Dr. Alen Blew, I informed patient that his PSA level went from 27.51 to 45.58. Dr. Alen Blew is switching him from [place] to [place]. Prescription for [name] has been taken to [facility] for prior authorization. Patient verbalized understanding.

## 2015-03-25 NOTE — Addendum Note (Signed)
Addended by: Wyatt Portela on: 03/25/2015 09:22 AM   Modules accepted: Orders, Medications

## 2015-03-25 NOTE — Progress Notes (Signed)
I faxed [facility]ologics req for asst with zytiga

## 2015-03-25 NOTE — Progress Notes (Signed)
Prescription for Zytiga placed in [facility] box.

## 2015-03-25 NOTE — Telephone Encounter (Signed)
-----   Message from Wyatt Portela, MD sent at 03/25/2015  9:20 AM EDT ----- Please let him know that his PSA is up and we will need to switch him from [place] to [place]. He knows that is a possibility.

## 2015-03-27 ENCOUNTER — Telehealth: Payer: Self-pay | Admitting: *Deleted

## 2015-03-27 ENCOUNTER — Encounter: Payer: Self-pay | Admitting: *Deleted

## 2015-03-27 ENCOUNTER — Encounter: Payer: Self-pay | Admitting: Oncology

## 2015-03-27 NOTE — Progress Notes (Signed)
Spoke with patient, let him know that his zytiga script was approved thru 03/25/16. [facility] will notify patient for delivery.

## 2015-03-27 NOTE — Progress Notes (Signed)
Per [facility]   Pa# [id number] zytiga  Approved thru 91/5/17

## 2015-03-27 NOTE — Telephone Encounter (Signed)
Voicemail from patient requesting call about a change Dr. Alen Blew is to make on his medications.  Has yet to received call from Pharmacy.

## 2015-03-31 ENCOUNTER — Encounter: Payer: Self-pay | Admitting: Oncology

## 2015-03-31 ENCOUNTER — Telehealth: Payer: Self-pay | Admitting: *Deleted

## 2015-03-31 NOTE — Progress Notes (Signed)
I sent message to dixie and dr about mess from Jenny Reichmann at [facility] [phone] ext [phone] about med that may cause a reaction if taken with zytiga??

## 2015-03-31 NOTE — Telephone Encounter (Signed)
Per Jenny Reichmann at [facility] zytiga may increase the effect of the carvediol, but it is very mild, so the zytiga was shipped to the patient's home. Dr Alen Blew notified.

## 2015-03-31 NOTE — Progress Notes (Signed)
Per [facility] zytiga was shipped to patient via [facility]

## 2015-04-06 NOTE — Telephone Encounter (Signed)
Randolm Idol, RN at 03/31/2015 11:36 AM     Status: Signed       Expand All Collapse All   Per Jenny Reichmann at [facility] zytiga may increase the effect of the carvediol, but it is very mild, so the zytiga was shipped to the patient's home. Dr Alen Blew notified.

## 2015-04-20 ENCOUNTER — Ambulatory Visit: Payer: Medicare Other | Admitting: Cardiology

## 2015-04-22 ENCOUNTER — Other Ambulatory Visit: Payer: Self-pay | Admitting: *Deleted

## 2015-04-22 ENCOUNTER — Telehealth: Payer: Self-pay | Admitting: Oncology

## 2015-04-22 ENCOUNTER — Encounter: Payer: Self-pay | Admitting: Cardiology

## 2015-04-22 ENCOUNTER — Other Ambulatory Visit (HOSPITAL_BASED_OUTPATIENT_CLINIC_OR_DEPARTMENT_OTHER): Payer: Medicare Other

## 2015-04-22 ENCOUNTER — Ambulatory Visit (HOSPITAL_BASED_OUTPATIENT_CLINIC_OR_DEPARTMENT_OTHER): Payer: Medicare Other | Admitting: Oncology

## 2015-04-22 VITALS — BP 140/62 | HR 106 | Temp 98.7°F | Resp 19 | Ht 71.0 in | Wt 211.7 lb

## 2015-04-22 DIAGNOSIS — D649 Anemia, unspecified: Secondary | ICD-10-CM

## 2015-04-22 DIAGNOSIS — C61 Malignant neoplasm of prostate: Secondary | ICD-10-CM

## 2015-04-22 DIAGNOSIS — E291 Testicular hypofunction: Secondary | ICD-10-CM

## 2015-04-22 DIAGNOSIS — C7951 Secondary malignant neoplasm of bone: Secondary | ICD-10-CM | POA: Diagnosis not present

## 2015-04-22 DIAGNOSIS — D508 Other iron deficiency anemias: Secondary | ICD-10-CM

## 2015-04-22 LAB — COMPREHENSIVE METABOLIC PANEL (CC13)
ALK PHOS: 499 U/L — AB (ref 40–150)
ALT: <9 U/L (ref 0–55)
AST: 15 U/L (ref 5–34)
Albumin: 2.2 g/dL — ABNORMAL LOW (ref 3.5–5.0)
Anion Gap: 7 mEq/L (ref 3–11)
BILIRUBIN TOTAL: 0.37 mg/dL (ref 0.20–1.20)
BUN: 16.2 mg/dL (ref 7.0–26.0)
CALCIUM: 7.9 mg/dL — AB (ref 8.4–10.4)
CHLORIDE: 109 meq/L (ref 98–109)
CO2: 22 mEq/L (ref 22–29)
Creatinine: 0.9 mg/dL (ref 0.7–1.3)
Glucose: 288 mg/dl — ABNORMAL HIGH (ref 70–140)
POTASSIUM: 3.5 meq/L (ref 3.5–5.1)
Sodium: 138 mEq/L (ref 136–145)
Total Protein: 5.9 g/dL — ABNORMAL LOW (ref 6.4–8.3)

## 2015-04-22 LAB — CBC WITH DIFFERENTIAL/PLATELET
BASO%: 0.4 % (ref 0.0–2.0)
BASOS ABS: 0 10*3/uL (ref 0.0–0.1)
EOS ABS: 0.1 10*3/uL (ref 0.0–0.5)
EOS%: 1.3 % (ref 0.0–7.0)
HEMATOCRIT: 22.6 % — AB (ref 38.4–49.9)
HGB: 7.3 g/dL — ABNORMAL LOW (ref 13.0–17.1)
LYMPH%: 12.7 % — ABNORMAL LOW (ref 14.0–49.0)
MCH: 27.7 pg (ref 27.2–33.4)
MCHC: 32.1 g/dL (ref 32.0–36.0)
MCV: 86.2 fL (ref 79.3–98.0)
MONO#: 0.6 10*3/uL (ref 0.1–0.9)
MONO%: 9.8 % (ref 0.0–14.0)
NEUT#: 4.5 10*3/uL (ref 1.5–6.5)
NEUT%: 75.8 % — AB (ref 39.0–75.0)
Platelets: 405 10*3/uL — ABNORMAL HIGH (ref 140–400)
RBC: 2.62 10*6/uL — ABNORMAL LOW (ref 4.20–5.82)
RDW: 18.8 % — ABNORMAL HIGH (ref 11.0–14.6)
WBC: 6 10*3/uL (ref 4.0–10.3)
lymph#: 0.8 10*3/uL — ABNORMAL LOW (ref 0.9–3.3)

## 2015-04-22 MED ORDER — ABIRATERONE ACETATE 250 MG PO TABS: 1000.0000 mg | ORAL_TABLET | Freq: Every day | ORAL | Status: DC

## 2015-04-22 NOTE — Progress Notes (Signed)
Hematology and Oncology Follow Up Visit  DENSIL OTTEY [id number] [birth year] 79 y.o. 04/22/2015 3:32 PM [name], MDBrackbill, [name], MD   Principle Diagnosis: 79 year old gentleman diagnosed with prostate cancer in April 2015. He presented with a PSA of 30 and a Gleason score 5+4 = 9. Staging workup showed metastatic bony disease with some lymphadenopathy.   Prior Therapy:  He was treated with androgen deprivation under the care of [name] with a PSA nadir close to 1.8. Most recently his PSA was increased up to 18 and repeat a bone scan showed progression of his disease indicating castration resistant disease. Xtandi 160 mg started in May 2016 was discontinued in September 2016.  Current therapy:  He is currently receiving androgen deprivation as well as monthly Xgeva. These are given at [facility]. Zytiga 1000 mg started in September 2016.  Interim History: Mr. Wildes presents today for a follow-up visit. Since the last visit, he started Zytiga and have tolerated the first month well. He has not reported any GI complaints including nausea , vomiting or dyspepsia. He takes it first thing in the morning and have not reported any worsening fatigue. He is able to attend sporting activities without any decline.   He denies any bloody or tarry stools. Is not having any nosebleeds. He is taking oral iron daily without any complications. He denied any lower extremity edema or constitutional symptoms. He reports very little to no pain at this time. Denies diffuse bony pain. He does not report any pathological fractures. He continues to live independently although his the daughter is visiting from out of town.   He reports his appetite slightly down and have lost close to 10 pounds in the last 6 months.  He does not report any constitutional symptoms of  Fevers or chills or sweats.. He did not report any deterioration in his performance status or activity level. He does not  report any shortness of breath or hemoptysis.  He does not report any chest pain , palpitation or leg edema. He does not report any constipation or diarrhea.  He does not report any early satiety or changes in his bowel habits.. He does not report any frequency urgency or hesitancy. The remaining review of systems unremarkable.  Medications: I have reviewed the patient's current medications.  Current Outpatient Prescriptions  Medication Sig Dispense Refill  . abiraterone Acetate (ZYTIGA) 250 MG tablet Take 4 tablets (1,000 mg total) by mouth daily. Take on an empty stomach 1 hour before or 2 hours after a meal 120 tablet 0  . ACCU-CHEK SOFTCLIX LANCETS lancets Use as instructed to check blood sugar 2 times per day dx code E11.65 100 each 3  . aliskiren (TEKTURNA) 150 MG tablet Take 1 tablet (150 mg total) by mouth daily. 30 tablet 5  . atorvastatin (LIPITOR) 10 MG tablet take 1 tablet by mouth once daily 30 tablet 5  . Blood Glucose Monitoring Suppl (ACCU-CHEK AVIVA PLUS) W/DEVICE KIT Use to check blood sugar 2 times per day dx code E11.65 1 kit 0  . carvedilol (COREG) 12.5 MG tablet Take 1 tablet (12.5 mg total) by mouth 2 (two) times daily with a meal. 60 tablet 5  . Cholecalciferol (VITAMIN D PO) Take 5,000 Units by mouth daily. Taking 5000 daily    . COMBIGAN 0.2-0.5 % ophthalmic solution Place 1 drop into the right eye daily.     Marland Kitchen diltiazem (TIAZAC) 360 MG 24 hr capsule Take 360 mg by mouth daily.      Marland Kitchen  docusate sodium (COLACE) 100 MG capsule Take 200 mg by mouth daily.    Marland Kitchen glucose blood (ACCU-CHEK AVIVA PLUS) test strip Use as instructed to check blood sugar 2 times per day dx code E11.65 100 each 3  . insulin aspart (NOVOLOG FLEXPEN) 100 UNIT/ML FlexPen Inject 12 units 3 times per day with meals 15 mL 3  . Insulin Detemir (LEVEMIR) 100 UNIT/ML Pen Inject 12 Units into the skin daily at 10 pm.    . Insulin Pen Needle (B-D UF III MINI PEN NEEDLES) 31G X 5 MM MISC 1 each by Does not apply  route 2 (two) times daily. 100 each 11  . LEVEMIR FLEXTOUCH 100 UNIT/ML Pen INEJCT 10 UNITS INTO SKIN DAILY AT 10 PM 15 mL 2  . Liraglutide 18 MG/3ML SOPN Inject 1.8 mg into the skin daily. 9 pen 1  . metFORMIN (GLUCOPHAGE) 500 MG tablet Take 1 tablet (500 mg total) by mouth 3 (three) times daily. (Patient taking differently: Take 500 mg by mouth 3 (three) times daily. Takes 1 in am and 2 in pm) 270 tablet 1  . oxyCODONE-acetaminophen (PERCOCET/ROXICET) 5-325 MG per tablet Take 1 tablet by mouth every 4 (four) hours as needed for severe pain. 30 tablet 0  . pioglitazone (ACTOS) 30 MG tablet Take 1 tablet (30 mg total) by mouth daily. 90 tablet 1  . prednisoLONE acetate (PRED FORTE) 1 % ophthalmic suspension Place 1 drop into both eyes 2 (two) times daily.     . tamsulosin (FLOMAX) 0.4 MG CAPS capsule Take 0.4 mg by mouth daily.     Marland Kitchen latanoprost (XALATAN) 0.005 % ophthalmic solution Place 0.005 % into the left eye at bedtime.      No current facility-administered medications for this visit.     Allergies: No Known Allergies  Past Medical History, Surgical history, Social history, and Family History were reviewed and updated.   Physical Exam: Blood pressure 140/62, pulse 106, temperature 98.7 F (37.1 C), temperature source Oral, resp. rate 19, height 5' 11"  (1.803 m), weight 211 lb 11.2 oz (96.026 kg), SpO2 100 %. ECOG: 0 General appearance:  Elderly gentleman without any distress. Head: Normocephalic, without obvious abnormality no oral ulcers or lesions. Neck: no adenopathy Lymph nodes: Cervical, supraclavicular, and axillary nodes normal. Heart:regular rate and rhythm, S1, S2 normal, no murmur, click, rub or gallop Lung:chest clear, no wheezing, rales, normal symmetric air entry. No wheezes or dullness to percussion. Abdomin: soft, non-tender, without masses or organomegaly EXT:no erythema, induration, or nodules   Lab Results: Lab Results  Component Value Date   WBC 6.0  04/22/2015   HGB 7.3* 04/22/2015   HCT 22.6* 04/22/2015   MCV 86.2 04/22/2015   PLT 405* 04/22/2015     Chemistry      Component Value Date/Time   NA 140 03/24/2015 1447   NA 135 12/23/2014 0842   K 3.9 03/24/2015 1447   K 4.1 12/23/2014 0842   CL 105 12/23/2014 0842   CO2 20* 03/24/2015 1447   CO2 24 12/23/2014 0842   BUN 22.3 03/24/2015 1447   BUN 26* 12/23/2014 0842   CREATININE 0.9 03/24/2015 1447   CREATININE 1.15 12/23/2014 0842      Component Value Date/Time   CALCIUM 7.9* 03/24/2015 1447   CALCIUM 8.6 12/23/2014 0842   ALKPHOS 391* 03/24/2015 1447   ALKPHOS 411* 12/10/2014 1013   AST 17 03/24/2015 1447   AST 12 12/10/2014 1013   ALT 15 03/24/2015 1447   ALT 9  12/10/2014 1013   BILITOT <0.20 03/24/2015 1447   BILITOT 0.2 12/10/2014 1013        Results for MAYCOL, HOYING (MRN [id number]) as of 04/22/2015 15:20  Ref. Range 01/23/2015 11:43 02/19/2015 14:19 03/24/2015 14:47  PSA Latest Ref Range: <=4.00 ng/mL 20.86 (H) 27.51 (H) 45.58 (H)     Impression and Plan:   79 year old gentleman with the following issues:  1. Castration resistant metastatic prostate cancer with disease to the bone. His initial diagnosis was in April 2015 with a PSA of 30 and a Gleason score of 5+4 = 9. After initial response with androgen deprivation, he developed castration resistant disease with clear progression by bone scan criteria noted on his recent bone scan on 10/29/2014. He also had a CT scan of the abdomen on 11/09/2014 which did not show any evidence of clear-cut visceral metastasis.  He received Xtandi for 5 months and had very little response at this time and that was discontinued in September 2016.  He is currently on Zytiga and have tolerated the first month well. His PSA is currently pending from today in the plan is to continue the same dose and schedule. It is very possible that he might require systemic chemotherapy given the aggressive nature of his cancer. He  elected to try Zytiga first before attempting chemotherapy.  2. Androgen depravation: This is to be continued indefinitely.  He has no delayed complications related to that. He is receiving at [facility].  3. Bone directed therapy: recommended to continue Xgeva on a monthly basis as he is doing. He is receiving that at [facility].  4. Anemia: Multifactorial in nature but certainly has an element of iron deficiency. His hemoglobin  Have declined slightly in the last few months and I have offered him few options at this time. Packed red cell transfusion can give him temporary improvement in his symptoms although he received it in May and had little benefit. IV iron and growth factor support could be also utilized in the setting review of decline for the time being. We have recommended that continue to watch this closely and transfuse if he developed symptoms. He is agreeable with this approach at this time.  5. Follow-up: Will be in 4 weeks.   Zola Button, MD 10/12/20163:32 PM

## 2015-04-22 NOTE — Telephone Encounter (Signed)
per pof to sch pt appt-gave pt copy of avs °

## 2015-04-23 ENCOUNTER — Other Ambulatory Visit: Payer: Self-pay | Admitting: *Deleted

## 2015-04-23 ENCOUNTER — Telehealth: Payer: Self-pay | Admitting: *Deleted

## 2015-04-23 DIAGNOSIS — C61 Malignant neoplasm of prostate: Secondary | ICD-10-CM

## 2015-04-23 DIAGNOSIS — C801 Malignant (primary) neoplasm, unspecified: Secondary | ICD-10-CM

## 2015-04-23 LAB — PSA: PSA: 68.78 ng/mL — AB (ref <=4.00)

## 2015-04-23 MED ORDER — MEGESTROL ACETATE 400 MG/10ML PO SUSP: 400.0000 mg | Freq: Two times a day (BID) | ORAL | Status: DC

## 2015-04-23 NOTE — Telephone Encounter (Signed)
This RN spoke with patient and informed him that I e-scribed a prescription for Megace to his pharmacy ([facility]). Patient verbalized understanding.

## 2015-04-23 NOTE — Telephone Encounter (Signed)
VM message received from pt's daughter requesting prescription for pt's appetite. Pt saw Dr. Alen Blew yesterday and apparently was told that a prescription for his decreased appetite would be called in. According to daughter, there is not a script at their pharmacy-[facility]

## 2015-04-23 NOTE — Telephone Encounter (Signed)
Call received by this nurse in reference to medication for appetite.  Also reports he is fatigued.  Using boost and stays in bed late and frequently has to rest in bed due to low energy.

## 2015-04-27 ENCOUNTER — Encounter: Payer: Self-pay | Admitting: Oncology

## 2015-04-27 ENCOUNTER — Other Ambulatory Visit: Payer: Medicare Other

## 2015-04-27 NOTE — Progress Notes (Signed)
Per [facility] zytiga was shipped via [employer]

## 2015-04-29 ENCOUNTER — Other Ambulatory Visit (INDEPENDENT_AMBULATORY_CARE_PROVIDER_SITE_OTHER): Payer: Medicare Other

## 2015-04-29 DIAGNOSIS — E1165 Type 2 diabetes mellitus with hyperglycemia: Secondary | ICD-10-CM | POA: Diagnosis not present

## 2015-04-29 DIAGNOSIS — IMO0002 Reserved for concepts with insufficient information to code with codable children: Secondary | ICD-10-CM

## 2015-04-29 LAB — COMPREHENSIVE METABOLIC PANEL
ALBUMIN: 2.7 g/dL — AB (ref 3.5–5.2)
ALT: 14 U/L (ref 0–53)
AST: 14 U/L (ref 0–37)
Alkaline Phosphatase: 583 U/L — ABNORMAL HIGH (ref 39–117)
BILIRUBIN TOTAL: 0.4 mg/dL (ref 0.2–1.2)
BUN: 23 mg/dL (ref 6–23)
CALCIUM: 8.2 mg/dL — AB (ref 8.4–10.5)
CHLORIDE: 106 meq/L (ref 96–112)
CO2: 23 mEq/L (ref 19–32)
CREATININE: 0.9 mg/dL (ref 0.40–1.50)
GFR: 105.1 mL/min (ref >60.00)
Glucose, Bld: 294 mg/dL — ABNORMAL HIGH (ref 70–99)
Potassium: 4 mEq/L (ref 3.5–5.1)
Sodium: 138 mEq/L (ref 135–145)
Total Protein: 6.2 g/dL (ref 6.0–8.3)

## 2015-04-29 LAB — HEMOGLOBIN A1C: Hgb A1c MFr Bld: 7.2 % — ABNORMAL HIGH (ref 4.6–6.5)

## 2015-05-01 ENCOUNTER — Ambulatory Visit (INDEPENDENT_AMBULATORY_CARE_PROVIDER_SITE_OTHER): Payer: Medicare Other | Admitting: Endocrinology

## 2015-05-01 ENCOUNTER — Encounter: Payer: Self-pay | Admitting: Endocrinology

## 2015-05-01 VITALS — BP 120/60 | HR 95 | Temp 98.2°F | Resp 14 | Ht 70.0 in | Wt 211.8 lb

## 2015-05-01 DIAGNOSIS — Z23 Encounter for immunization: Secondary | ICD-10-CM

## 2015-05-01 DIAGNOSIS — E1165 Type 2 diabetes mellitus with hyperglycemia: Secondary | ICD-10-CM

## 2015-05-01 DIAGNOSIS — E441 Mild protein-calorie malnutrition: Secondary | ICD-10-CM | POA: Diagnosis not present

## 2015-05-01 DIAGNOSIS — Z794 Long term (current) use of insulin: Secondary | ICD-10-CM

## 2015-05-01 NOTE — Progress Notes (Signed)
Patient ID: Jeremy Jennings, male   DOB: 03-18-1936, 79 y.o.   MRN: [id number]   Reason for Appointment:  follow-up of diabetes  History of Present Illness:    Type 2 DIABETES MELITUS, date of diagnosis:  1981        PREVIOUS history:  He has had long-standing diabetes treated with multiple drugs over the years and still continues to take a multidrug regimen He was started on Victoza in 2010 somewhat better diabetes control and better weight However because of tendency to high postprandial readings and higher A1c was also given mealtime coverage with NovoLog about 2 years ago  Because of his A1c going up to 8.3 in 03/2013 his Victoza was increased from 1.2 up to 1.8 mg Because of worsening blood sugar control and A1c of 8.6% he was started on Levemir insulin in 02/2014  RECENT history:  Insulin regimen: 12-14 before meals of NovoLog,  Levemir 14 hs   He is on a basal bolus insulin regimen along with Actos, Victoza and metformin He has not been checking his blood sugars much lately Blood sugars overall quite erratic at various times with some significantly high readings which she cannot explain, may be not consistently taking his Novolog and mealtimes He did have some hypoglycemia on 10/5 and also on 9/29 when he was not eating as much due to his new chemotherapy drug Also not clear why some mornings his fasting readings are higher and his are not consistent His appetite is improving now; however he has not lost any weight since his last visit  A1c is relatively better at 7.2 He has breakfast between 8-10 AM and dinner usually 6 PM  Oral hypoglycemic drugs: Actos, metformin bid     Side effects from medications: None       Proper timing of medications in relation to meals: Yes.          Monitors blood glucose:  recently less than 1 times a day.    Glucometer:  FreeStyle         Blood Glucose readings   Mean values apply above for all meters except median for One  Touch  PRE-MEAL Fasting Lunch Dinner Bedtime Overall  Glucose range:  67-180   42   74     Mean/median:      125    POST-MEAL PC Breakfast PC Lunch PC Dinner  Glucose range:   55, 63   138-315   Mean/median:      Physical activity: exercise: Some  walking          Dietician visit: Most recent: 5/57           Complications: are: None     Wt Readings from Last 3 Encounters:  05/01/15 211 lb 12.8 oz (96.072 kg)  04/22/15 211 lb 11.2 oz (96.026 kg)  03/24/15 214 lb 14.4 oz (97.478 kg)   LABS:   Lab Results  Component Value Date   HGBA1C 7.2* 04/29/2015   HGBA1C 7.4* 12/23/2014   HGBA1C 8.3* 09/22/2014   Lab Results  Component Value Date   MICROALBUR 3.0* 12/26/2014   LDLCALC 108* 12/10/2014   CREATININE 0.90 04/29/2015    Appointment on 04/29/2015  Component Date Value Ref Range Status  . Hgb A1c MFr Bld 04/29/2015 7.2* 4.6 - 6.5 % Final   Glycemic Control Guidelines for People with Diabetes:Non Diabetic:  <6%Goal of Therapy: <7%Additional Action Suggested:  >8%   . Sodium 04/29/2015 138  135 -  145 mEq/L Final  . Potassium 04/29/2015 4.0  3.5 - 5.1 mEq/L Final  . Chloride 04/29/2015 106  96 - 112 mEq/L Final  . CO2 04/29/2015 23  19 - 32 mEq/L Final  . Glucose, Bld 04/29/2015 294* 70 - 99 mg/dL Final  . BUN 04/29/2015 23  6 - 23 mg/dL Final  . Creatinine, Ser 04/29/2015 0.90  0.40 - 1.50 mg/dL Final  . Total Bilirubin 04/29/2015 0.4  0.2 - 1.2 mg/dL Final  . Alkaline Phosphatase 04/29/2015 583* 39 - 117 U/L Final  . AST 04/29/2015 14  0 - 37 U/L Final  . ALT 04/29/2015 14  0 - 53 U/L Final  . Total Protein 04/29/2015 6.2  6.0 - 8.3 g/dL Final  . Albumin 04/29/2015 2.7* 3.5 - 5.2 g/dL Final  . Calcium 04/29/2015 8.2* 8.4 - 10.5 mg/dL Final  . GFR 04/29/2015 105.10  >60.00 mL/min Final      Medication List       This list is accurate as of: 05/01/15 10:08 AM.  Always use your most recent med list.               abiraterone Acetate 250 MG tablet  Commonly  known as:  ZYTIGA  Take 4 tablets (1,000 mg total) by mouth daily. Take on an empty stomach 1 hour before or 2 hours after a meal     ACCU-CHEK AVIVA PLUS W/DEVICE Kit  Use to check blood sugar 2 times per day dx code E11.65     ACCU-CHEK SOFTCLIX LANCETS lancets  Use as instructed to check blood sugar 2 times per day dx code E11.65     aliskiren 150 MG tablet  Commonly known as:  TEKTURNA  Take 1 tablet (150 mg total) by mouth daily.     atorvastatin 10 MG tablet  Commonly known as:  LIPITOR  take 1 tablet by mouth once daily     carvedilol 12.5 MG tablet  Commonly known as:  COREG  Take 1 tablet (12.5 mg total) by mouth 2 (two) times daily with a meal.     COMBIGAN 0.2-0.5 % ophthalmic solution  Generic drug:  brimonidine-timolol  Place 1 drop into the right eye daily.     diltiazem 360 MG 24 hr capsule  Commonly known as:  TIAZAC  Take 360 mg by mouth daily.     docusate sodium 100 MG capsule  Commonly known as:  COLACE  Take 200 mg by mouth daily.     glucose blood test strip  Commonly known as:  ACCU-CHEK AVIVA PLUS  Use as instructed to check blood sugar 2 times per day dx code E11.65     insulin aspart 100 UNIT/ML FlexPen  Commonly known as:  NOVOLOG FLEXPEN  Inject 12 units 3 times per day with meals     Insulin Detemir 100 UNIT/ML Pen  Commonly known as:  LEVEMIR  Inject 12 Units into the skin daily at 10 pm.     Insulin Pen Needle 31G X 5 MM Misc  Commonly known as:  B-D UF III MINI PEN NEEDLES  1 each by Does not apply route 2 (two) times daily.     latanoprost 0.005 % ophthalmic solution  Commonly known as:  XALATAN  Place 0.005 % into the left eye at bedtime.     Liraglutide 18 MG/3ML Sopn  Inject 1.8 mg into the skin daily.     megestrol 20 MG tablet  Commonly known as:  MEGACE  megestrol 400 MG/10ML suspension  Commonly known as:  MEGACE  Take 10 mLs (400 mg total) by mouth 2 (two) times daily.     metFORMIN 500 MG tablet  Commonly  known as:  GLUCOPHAGE  Take 1 tablet (500 mg total) by mouth 3 (three) times daily.     oxyCODONE-acetaminophen 5-325 MG tablet  Commonly known as:  PERCOCET/ROXICET  Take 1 tablet by mouth every 4 (four) hours as needed for severe pain.     pioglitazone 30 MG tablet  Commonly known as:  ACTOS  Take 1 tablet (30 mg total) by mouth daily.     prednisoLONE acetate 1 % ophthalmic suspension  Commonly known as:  PRED FORTE  Place 1 drop into both eyes 2 (two) times daily.     tamsulosin 0.4 MG Caps capsule  Commonly known as:  FLOMAX  Take 0.4 mg by mouth daily.     VITAMIN D PO  Take 5,000 Units by mouth daily. Taking 5000 daily        Allergies: No Known Allergies  Past Medical History  Diagnosis Date  . Hypertension   . Diabetes mellitus   . Heart murmur     SYSTOLIC MURMUR AT BASE  . Hyperlipidemia   . Prostate cancer [facility])     Past Surgical History  Procedure Laterality Date  . Tonsillectomy      IN [facility]  . Hemorroidectomy  1969  . Hernia repair  AFE [age]  . Hernia repair  1981    DR [place]    Family History  Problem Relation Age of Onset  . Cancer Neg Hx   . Kidney failure Brother   . Kidney failure Sister     Social History:  reports that he quit smoking about 35 years ago. His smoking use included Cigarettes. He has a 5 pack-year smoking history. He has never used smokeless tobacco. He reports that he does not drink alcohol or use illicit drugs.  Review of Systems:   HYPERLIPIDEMIA: He has had them  elevated LDL, on Lipitor for several years  This is managed by his cardiologist and the dose has been changed to 10 mg by cardiologist, LDL appears relatively higher Liver functions are normal except alkaline phosphatase  Lab Results  Component Value Date   CHOL 167 12/10/2014   HDL 37.50* 12/10/2014   LDLCALC 108* 12/10/2014   TRIG 109.0 12/10/2014   CHOLHDL 4 12/10/2014    Renal insufficiency: His creatinine is  consistently in the normal  range     Lab Results  Component Value Date   CREATININE 0.90 04/29/2015   Diabetic foot exam done in 3/16   Prostate cancer: His PSA is about 69 now.  He is just starting new medication for chemotherapy and is trying to get adjusted to it because of issues with decreased appetite or nausea     Examination:   BP 120/60 mmHg  Pulse 95  Temp(Src) 98.2 F (36.8 C)  Resp 14  Ht 5' 10"  (1.778 m)  Wt 211 lb 12.8 oz (96.072 kg)  BMI 30.39 kg/m2  SpO2 99%  Body mass index is 30.39 kg/(m^2).       ASSESSMENT/ PLAN:   Diabetes type 2 with mild obesity. See history of present illness for detailed discussion of his current management, blood sugar patterns and problems identified  Because of his variable appetite and food intake his blood sugars have been more labile He has not had any hypoglycemia in the last 2  weeks as his appetite has improved However he has not been able to adjust his insulin adequately because of his decreased appetite and sometimes made more or less than predicted. Also he is drinking boost which may have more carbohydrates or sugar. Also not clear why his fasting readings are occasionally high, may be a carryover from the night before  Lab glucose was 294, possibly from drinking juice or forgetting his Novolog in the morning   Recommendation made today as patient instructions: Will not change his basic regimen but have them adjust his Novolog based on meal size, using the dose between 6-12 units He will take today Novolog right after eating to better assess his intake  He will try to get the diabetic boost or Glucerna No change in metformin Reduce Victoza to 1.8 mg  Hypertension: His blood pressure is well controlled  Poor nutrition: He has low albumin levels and encouraged him to take protein supplement or boost consistently daily  ANEMIA: Followed by a hematologist now  HYPERLIPIDEMIA: Recheck levels on the next visit   Patient Instructions  Check  sugar 2x daily at various times   Novolog right after eating if not sure of intake  Use diabetic boost      [facility] 05/01/2015, 10:08 AM

## 2015-05-01 NOTE — Patient Instructions (Signed)
Check sugar 2x daily at various times   Novolog right after eating if not sure of intake  Use diabetic boost

## 2015-05-18 ENCOUNTER — Telehealth: Payer: Self-pay | Admitting: Oncology

## 2015-05-18 ENCOUNTER — Other Ambulatory Visit (HOSPITAL_BASED_OUTPATIENT_CLINIC_OR_DEPARTMENT_OTHER): Payer: Medicare Other

## 2015-05-18 ENCOUNTER — Ambulatory Visit (HOSPITAL_COMMUNITY)
Admission: RE | Admit: 2015-05-18 | Discharge: 2015-05-18 | Disposition: A | Discharge status: Home / Self Care | Payer: Medicare Other | Source: Ambulatory Visit | Attending: Oncology | Admitting: Oncology

## 2015-05-18 ENCOUNTER — Ambulatory Visit (HOSPITAL_BASED_OUTPATIENT_CLINIC_OR_DEPARTMENT_OTHER): Payer: Medicare Other | Admitting: Oncology

## 2015-05-18 ENCOUNTER — Other Ambulatory Visit: Payer: Self-pay | Admitting: *Deleted

## 2015-05-18 VITALS — BP 159/66 | HR 101 | Temp 98.5°F | Resp 16

## 2015-05-18 VITALS — BP 143/63 | HR 97 | Temp 98.2°F | Resp 20 | Ht 70.0 in | Wt 206.0 lb

## 2015-05-18 DIAGNOSIS — C61 Malignant neoplasm of prostate: Secondary | ICD-10-CM | POA: Diagnosis not present

## 2015-05-18 DIAGNOSIS — D508 Other iron deficiency anemias: Secondary | ICD-10-CM

## 2015-05-18 DIAGNOSIS — D649 Anemia, unspecified: Secondary | ICD-10-CM | POA: Diagnosis not present

## 2015-05-18 DIAGNOSIS — C801 Malignant (primary) neoplasm, unspecified: Secondary | ICD-10-CM

## 2015-05-18 DIAGNOSIS — E291 Testicular hypofunction: Secondary | ICD-10-CM

## 2015-05-18 DIAGNOSIS — C7951 Secondary malignant neoplasm of bone: Secondary | ICD-10-CM | POA: Diagnosis not present

## 2015-05-18 LAB — CBC WITH DIFFERENTIAL/PLATELET
BASO%: 0.6 % (ref 0.0–2.0)
Basophils Absolute: 0 10*3/uL (ref 0.0–0.1)
EOS%: 1.2 % (ref 0.0–7.0)
Eosinophils Absolute: 0.1 10*3/uL (ref 0.0–0.5)
HCT: 22.2 % — ABNORMAL LOW (ref 38.4–49.9)
HGB: 7.2 g/dL — ABNORMAL LOW (ref 13.0–17.1)
LYMPH%: 13.5 % — AB (ref 14.0–49.0)
MCH: 27.9 pg (ref 27.2–33.4)
MCHC: 32.3 g/dL (ref 32.0–36.0)
MCV: 86.3 fL (ref 79.3–98.0)
MONO#: 0.5 10*3/uL (ref 0.1–0.9)
MONO%: 8 % (ref 0.0–14.0)
NEUT%: 76.7 % — AB (ref 39.0–75.0)
NEUTROS ABS: 4.6 10*3/uL (ref 1.5–6.5)
PLATELETS: 389 10*3/uL (ref 140–400)
RBC: 2.57 10*6/uL — AB (ref 4.20–5.82)
RDW: 19 % — ABNORMAL HIGH (ref 11.0–14.6)
WBC: 6 10*3/uL (ref 4.0–10.3)
lymph#: 0.8 10*3/uL — ABNORMAL LOW (ref 0.9–3.3)

## 2015-05-18 LAB — COMPREHENSIVE METABOLIC PANEL (CC13)
ALT: <9 U/L (ref 0–55)
AST: 18 U/L (ref 5–34)
Albumin: 2.4 g/dL — ABNORMAL LOW (ref 3.5–5.0)
Alkaline Phosphatase: 488 U/L — ABNORMAL HIGH (ref 40–150)
Anion Gap: 10 mEq/L (ref 3–11)
BUN: 18.3 mg/dL (ref 7.0–26.0)
CO2: 18 mEq/L — ABNORMAL LOW (ref 22–29)
Calcium: 8.4 mg/dL (ref 8.4–10.4)
Chloride: 113 mEq/L — ABNORMAL HIGH (ref 98–109)
Creatinine: 0.8 mg/dL (ref 0.7–1.3)
Glucose: 84 mg/dl (ref 70–140)
POTASSIUM: 3.5 meq/L (ref 3.5–5.1)
SODIUM: 142 meq/L (ref 136–145)
TOTAL PROTEIN: 6.5 g/dL (ref 6.4–8.3)
Total Bilirubin: 0.33 mg/dL (ref 0.20–1.20)

## 2015-05-18 LAB — HOLD TUBE, BLOOD BANK

## 2015-05-18 LAB — PREPARE RBC (CROSSMATCH)

## 2015-05-18 MED ORDER — DIPHENHYDRAMINE HCL 25 MG PO CAPS
25.0000 mg | ORAL_CAPSULE | Freq: Once | ORAL | Status: AC
  Administered 2015-05-18: 25 mg via ORAL
  Filled 2015-05-18: qty 1

## 2015-05-18 MED ORDER — HEPARIN SOD (PORK) LOCK FLUSH 100 UNIT/ML IV SOLN: 500.0000 [IU] | Freq: Every day | INTRAVENOUS | Status: DC | PRN

## 2015-05-18 MED ORDER — SODIUM CHLORIDE 0.9 % IJ SOLN: 10.0000 mL | INTRAMUSCULAR | Status: DC | PRN

## 2015-05-18 MED ORDER — SODIUM CHLORIDE 0.9 % IV SOLN: 250.0000 mL | Freq: Once | INTRAVENOUS | Status: DC

## 2015-05-18 MED ORDER — HEPARIN SOD (PORK) LOCK FLUSH 100 UNIT/ML IV SOLN: 250.0000 [IU] | INTRAVENOUS | Status: DC | PRN

## 2015-05-18 MED ORDER — ACETAMINOPHEN 325 MG PO TABS
650.0000 mg | ORAL_TABLET | Freq: Once | ORAL | Status: AC
  Administered 2015-05-18: 650 mg via ORAL
  Filled 2015-05-18: qty 2

## 2015-05-18 MED ORDER — SODIUM CHLORIDE 0.9 % IJ SOLN: 3.0000 mL | INTRAMUSCULAR | Status: DC | PRN

## 2015-05-18 MED ORDER — MEGESTROL ACETATE 400 MG/10ML PO SUSP: 400.0000 mg | Freq: Two times a day (BID) | ORAL | Status: DC

## 2015-05-18 MED ORDER — SODIUM CHLORIDE 0.9 % IV SOLN: Freq: Once | INTRAVENOUS | Status: DC

## 2015-05-18 NOTE — Progress Notes (Signed)
Hematology and Oncology Follow Up Visit  Jeremy Jennings [id number] [birth year] 79 y.o. 05/18/2015 9:12 AM Jeremy Jennings, MDBrackbill, Jeremy Moores, MD   Principle Diagnosis: 79 year old gentleman diagnosed with prostate cancer in April 2015. He presented with a PSA of 30 and a Gleason score 5+4 = 9. Staging workup showed metastatic bony disease with some lymphadenopathy.   Prior Therapy:  He was treated with androgen deprivation under the care of Jeremy Jennings with a PSA nadir close to 1.8. Most recently his PSA was increased up to 18 and repeat a bone scan showed progression of his disease indicating castration resistant disease. Xtandi 160 mg started in May 2016 was discontinued in September 2016.  Current therapy:  He is currently receiving androgen deprivation as well as monthly Xgeva. These are given at [facility]. Zytiga 1000 mg started in September 2016.  Interim History: Jeremy Jennings presents today for a follow-up visit. Since the last visit, he reports no recent complaints. He does have some increased fatigue and tiredness but no chest pain or dyspnea on exertion. He continues to tolerate Zytiga well. He has not reported any GI complaints including nausea , vomiting or dyspepsia. He takes it first thing in the morning and have not reported any worsening fatigue. He is able to attend sporting activities without any decline. His appetite slightly down and have lost some weight.  He denies any bloody or tarry stools. Is not having any nosebleeds. He is taking oral iron daily without any complications. He has not reported any recent hospitalization or illnesses. He does have slight increased pain in his knees predominantly but no back pain shoulder pain or hip pain.   He does not report any constitutional symptoms of  Fevers or chills or sweats.. He did not report any deterioration in his performance status or activity level. He does not report any shortness of breath or hemoptysis.  He  does not report any chest pain , palpitation or leg edema. He does not report any constipation or diarrhea.  He does not report any early satiety or changes in his bowel habits.. He does not report any frequency urgency or hesitancy. The remaining review of systems unremarkable.  Medications: I have reviewed the patient's current medications.  Current Outpatient Prescriptions  Medication Sig Dispense Refill  . abiraterone Acetate (ZYTIGA) 250 MG tablet Take 4 tablets (1,000 mg total) by mouth daily. Take on an empty stomach 1 hour before or 2 hours after a meal 120 tablet 0  . ACCU-CHEK SOFTCLIX LANCETS lancets Use as instructed to check blood sugar 2 times per day dx code E11.65 100 each 3  . aliskiren (TEKTURNA) 150 MG tablet Take 1 tablet (150 mg total) by mouth daily. 30 tablet 5  . atorvastatin (LIPITOR) 10 MG tablet take 1 tablet by mouth once daily 30 tablet 5  . Blood Glucose Monitoring Suppl (ACCU-CHEK AVIVA PLUS) W/DEVICE KIT Use to check blood sugar 2 times per day dx code E11.65 1 kit 0  . carvedilol (COREG) 12.5 MG tablet Take 1 tablet (12.5 mg total) by mouth 2 (two) times daily with a meal. 60 tablet 5  . Cholecalciferol (VITAMIN D PO) Take 5,000 Units by mouth daily. Taking 5000 daily    . COMBIGAN 0.2-0.5 % ophthalmic solution Place 1 drop into the right eye daily.     Marland Kitchen diltiazem (TIAZAC) 360 MG 24 hr capsule Take 360 mg by mouth daily.      Marland Kitchen docusate sodium (COLACE) 100 MG capsule Take 200  mg by mouth daily.    Marland Kitchen glucose blood (ACCU-CHEK AVIVA PLUS) test strip Use as instructed to check blood sugar 2 times per day dx code E11.65 100 each 3  . insulin aspart (NOVOLOG FLEXPEN) 100 UNIT/ML FlexPen Inject 12 units 3 times per day with meals 15 mL 3  . Insulin Detemir (LEVEMIR) 100 UNIT/ML Pen Inject 12 Units into the skin daily at 10 pm.    . Insulin Pen Needle (B-D UF III MINI PEN NEEDLES) 31G X 5 MM MISC 1 each by Does not apply route 2 (two) times daily. 100 each 11  .  latanoprost (XALATAN) 0.005 % ophthalmic solution Place 0.005 % into the left eye at bedtime.     . Liraglutide 18 MG/3ML SOPN Inject 1.8 mg into the skin daily. 9 pen 1  . megestrol (MEGACE) 20 MG tablet   0  . oxyCODONE-acetaminophen (PERCOCET/ROXICET) 5-325 MG per tablet Take 1 tablet by mouth every 4 (four) hours as needed for severe pain. 30 tablet 0  . pioglitazone (ACTOS) 30 MG tablet Take 1 tablet (30 mg total) by mouth daily. 90 tablet 1  . prednisoLONE acetate (PRED FORTE) 1 % ophthalmic suspension Place 1 drop into both eyes 2 (two) times daily.     . tamsulosin (FLOMAX) 0.4 MG CAPS capsule Take 0.4 mg by mouth daily.     . megestrol (MEGACE) 400 MG/10ML suspension Take 10 mLs (400 mg total) by mouth 2 (two) times daily. 240 mL 0   No current facility-administered medications for this visit.     Allergies: No Known Allergies  Past Medical History, Surgical history, Social history, and Family History were reviewed and updated.   Physical Exam: Blood pressure 143/63, pulse 97, temperature 98.2 F (36.8 C), temperature source Oral, resp. rate 20, height _0  (1.778 m), weight 206 lb (93.441 kg), SpO2 100 %. ECOG: 1 General appearance: Alert, awake gentleman without any distress. Head: Normocephalic, without obvious abnormality no oral thrush noted. Neck: no adenopathy Lymph nodes: Cervical, supraclavicular, and axillary nodes normal. Heart:regular rate and rhythm, S1, S2 normal, no murmur, click, rub or gallop Lung:chest clear, no wheezing, rales, normal symmetric air entry. No wheezes or dullness to percussion. Abdomin: soft, non-tender, without masses or organomegaly EXT:no erythema, induration, or nodules   Lab Results: Lab Results  Component Value Date   WBC 6.0 05/18/2015   HGB 7.2* 05/18/2015   HCT 22.2* 05/18/2015   MCV 86.3 05/18/2015   PLT 389 05/18/2015     Chemistry      Component Value Date/Time   NA 138 04/29/2015 1005   NA 138 04/22/2015 1509   K  4.0 04/29/2015 1005   K 3.5 04/22/2015 1509   CL 106 04/29/2015 1005   CO2 23 04/29/2015 1005   CO2 22 04/22/2015 1509   BUN 23 04/29/2015 1005   BUN 16.2 04/22/2015 1509   CREATININE 0.90 04/29/2015 1005   CREATININE 0.9 04/22/2015 1509      Component Value Date/Time   CALCIUM 8.2* 04/29/2015 1005   CALCIUM 7.9* 04/22/2015 1509   ALKPHOS 583* 04/29/2015 1005   ALKPHOS 499* 04/22/2015 1509   AST 14 04/29/2015 1005   AST 15 04/22/2015 1509   ALT 14 04/29/2015 1005   ALT <9 04/22/2015 1509   BILITOT 0.4 04/29/2015 1005   BILITOT 0.37 04/22/2015 1509       Results for KELON, EASOM (MRN [id number]) as of 05/18/2015 08:53  Ref. Range 02/19/2015 14:19 03/24/2015 14:47 04/22/2015 15:09  PSA Latest Ref Range: <=4.00 ng/mL 27.51 (H) 45.58 (H) 68.78 (H)       Impression and Plan:   79 year old gentleman with the following issues:  1. Castration resistant metastatic prostate cancer with disease to the bone. His initial diagnosis was in April 2015 with a PSA of 30 and a Gleason score of 5+4 = 9. After initial response with androgen deprivation, he developed castration resistant disease with clear progression by bone scan criteria noted on his recent bone scan on 10/29/2014. He also had a CT scan of the abdomen on 11/09/2014 which did not show any evidence of clear-cut visceral metastasis. He received Xtandi for 5 months and had very little response at this time and that was discontinued in September 2016.  He is currently on Zytiga and have tolerated it well. His PSA is up after the first few weeks of therapy to 68.7. PSA from today is currently pending. If his PSA continues to rise on this therapy, systemic chemotherapy would be the next option. This was discussed with the patient extensively today and he is agreeable to this plan. The plan as of now is to continue with Zytiga for at least one month and we'll make a determination after that.  2. Androgen depravation: This is to be  continued indefinitely.  He has no delayed complications related to that. He is receiving at [facility].  3. Bone directed therapy: recommended to continue Xgeva on a monthly basis as he is doing. He is receiving that at [facility].  4. Anemia: Multifactorial in nature but certainly has an element of iron deficiency. His hemoglobin is relatively stable but continues to be low. I am rechecking his iron stores to make sure there adequately replaced. In the meantime he is mildly symptomatic and I offered him 2 units of packed red cell transfusions. Risks and benefits were discussed and he is agreeable to receive that at this time. Other therapeutic options including intravenous iron and growth factor support can be used as well as the future.  5. Follow-up: Will be in 4 weeks.   [facility], MD 11/7/20169:12 AM

## 2015-05-18 NOTE — Progress Notes (Signed)
Diagnosis Association: Prostate cancer ([place]) (185)  MD: Dr. Alen Blew  Procedure: Pt received 2 units of PRBCs   Condition during procedure: Pt tolerated well  Condition post procedure: Pt alert, oriented and ambulatory

## 2015-05-18 NOTE — Progress Notes (Signed)
Patient instructed to go to the [facility] cell center at 10:30 today for 2 units of packed rbc's. Do not remove blue bracelet.

## 2015-05-18 NOTE — Telephone Encounter (Signed)
Gave adn printed papt sched and avs fo rpt for DEc

## 2015-05-19 ENCOUNTER — Encounter: Payer: Self-pay | Admitting: Oncology

## 2015-05-19 LAB — TYPE AND SCREEN
ABO/RH(D): B POS
ANTIBODY SCREEN: NEGATIVE
Unit division: 0
Unit division: 0

## 2015-05-19 LAB — PSA: PSA: 94.88 ng/mL — ABNORMAL HIGH (ref <=4.00)

## 2015-05-19 NOTE — Progress Notes (Signed)
I faxed [employer] req for megestrol

## 2015-05-20 ENCOUNTER — Encounter: Payer: Self-pay | Admitting: Oncology

## 2015-05-20 NOTE — Progress Notes (Signed)
I placed [employer] form on desk of nurse for dr. Alen Blew

## 2015-05-21 ENCOUNTER — Encounter: Payer: Self-pay | Admitting: Oncology

## 2015-05-21 NOTE — Progress Notes (Signed)
Per [employer] megestrol approved under medicare part d  Thru 05/18/16 [id number]. I will send to medical records

## 2015-05-22 ENCOUNTER — Other Ambulatory Visit: Payer: Self-pay | Admitting: *Deleted

## 2015-05-22 DIAGNOSIS — C61 Malignant neoplasm of prostate: Secondary | ICD-10-CM

## 2015-05-22 MED ORDER — ABIRATERONE ACETATE 250 MG PO TABS: 1000.0000 mg | ORAL_TABLET | Freq: Every day | ORAL | Status: DC

## 2015-05-27 ENCOUNTER — Inpatient Hospital Stay (HOSPITAL_COMMUNITY)
Admission: EM | Admit: 2015-05-27 | Discharge: 2015-05-30 | DRG: 377 | Disposition: A | Discharge status: Home Health | Payer: Medicare Other | Attending: Internal Medicine | Admitting: Internal Medicine

## 2015-05-27 ENCOUNTER — Encounter (HOSPITAL_COMMUNITY): Payer: Self-pay

## 2015-05-27 DIAGNOSIS — C7951 Secondary malignant neoplasm of bone: Secondary | ICD-10-CM

## 2015-05-27 DIAGNOSIS — I10 Essential (primary) hypertension: Secondary | ICD-10-CM

## 2015-05-27 DIAGNOSIS — R627 Adult failure to thrive: Secondary | ICD-10-CM | POA: Diagnosis present

## 2015-05-27 DIAGNOSIS — K921 Melena: Secondary | ICD-10-CM | POA: Diagnosis present

## 2015-05-27 DIAGNOSIS — R000 Tachycardia, unspecified: Secondary | ICD-10-CM | POA: Diagnosis present

## 2015-05-27 DIAGNOSIS — Z87891 Personal history of nicotine dependence: Secondary | ICD-10-CM | POA: Diagnosis not present

## 2015-05-27 DIAGNOSIS — K922 Gastrointestinal hemorrhage, unspecified: Secondary | ICD-10-CM

## 2015-05-27 DIAGNOSIS — Z7952 Long term (current) use of systemic steroids: Secondary | ICD-10-CM

## 2015-05-27 DIAGNOSIS — Z66 Do not resuscitate: Secondary | ICD-10-CM | POA: Diagnosis present

## 2015-05-27 DIAGNOSIS — K449 Diaphragmatic hernia without obstruction or gangrene: Secondary | ICD-10-CM | POA: Diagnosis present

## 2015-05-27 DIAGNOSIS — D62 Acute posthemorrhagic anemia: Secondary | ICD-10-CM | POA: Diagnosis present

## 2015-05-27 DIAGNOSIS — Z6827 Body mass index (BMI) 27.0-27.9, adult: Secondary | ICD-10-CM | POA: Diagnosis not present

## 2015-05-27 DIAGNOSIS — E43 Unspecified severe protein-calorie malnutrition: Secondary | ICD-10-CM | POA: Diagnosis present

## 2015-05-27 DIAGNOSIS — D649 Anemia, unspecified: Secondary | ICD-10-CM

## 2015-05-27 DIAGNOSIS — Z794 Long term (current) use of insulin: Secondary | ICD-10-CM | POA: Diagnosis not present

## 2015-05-27 DIAGNOSIS — Z791 Long term (current) use of non-steroidal anti-inflammatories (NSAID): Secondary | ICD-10-CM | POA: Diagnosis not present

## 2015-05-27 DIAGNOSIS — Z79891 Long term (current) use of opiate analgesic: Secondary | ICD-10-CM | POA: Diagnosis not present

## 2015-05-27 DIAGNOSIS — C61 Malignant neoplasm of prostate: Secondary | ICD-10-CM

## 2015-05-27 DIAGNOSIS — E119 Type 2 diabetes mellitus without complications: Secondary | ICD-10-CM

## 2015-05-27 DIAGNOSIS — Z841 Family history of disorders of kidney and ureter: Secondary | ICD-10-CM

## 2015-05-27 DIAGNOSIS — E785 Hyperlipidemia, unspecified: Secondary | ICD-10-CM | POA: Diagnosis present

## 2015-05-27 DIAGNOSIS — N182 Chronic kidney disease, stage 2 (mild): Secondary | ICD-10-CM

## 2015-05-27 DIAGNOSIS — R011 Cardiac murmur, unspecified: Secondary | ICD-10-CM | POA: Diagnosis present

## 2015-05-27 DIAGNOSIS — Z79899 Other long term (current) drug therapy: Secondary | ICD-10-CM

## 2015-05-27 DIAGNOSIS — K2210 Ulcer of esophagus without bleeding: Secondary | ICD-10-CM | POA: Diagnosis present

## 2015-05-27 LAB — COMPREHENSIVE METABOLIC PANEL
ALBUMIN: 2.3 g/dL — AB (ref 3.5–5.0)
ALK PHOS: 500 U/L — AB (ref 38–126)
ALT: 10 U/L — AB (ref 17–63)
ANION GAP: 7 (ref 5–15)
AST: 14 U/L — ABNORMAL LOW (ref 15–41)
BUN: 25 mg/dL — ABNORMAL HIGH (ref 6–20)
CALCIUM: 8 mg/dL — AB (ref 8.9–10.3)
CHLORIDE: 110 mmol/L (ref 101–111)
CO2: 22 mmol/L (ref 22–32)
Creatinine, Ser: 0.92 mg/dL (ref 0.61–1.24)
GFR calc Af Amer: >60 mL/min (ref >60)
GFR calc non Af Amer: >60 mL/min (ref >60)
GLUCOSE: 230 mg/dL — AB (ref 65–99)
Potassium: 3.8 mmol/L (ref 3.5–5.1)
SODIUM: 139 mmol/L (ref 135–145)
Total Bilirubin: 0.5 mg/dL (ref 0.3–1.2)
Total Protein: 6.1 g/dL — ABNORMAL LOW (ref 6.5–8.1)

## 2015-05-27 LAB — CBC
HCT: 22.4 % — ABNORMAL LOW (ref 39.0–52.0)
HEMOGLOBIN: 7.1 g/dL — AB (ref 13.0–17.0)
MCH: 27.5 pg (ref 26.0–34.0)
MCHC: 31.7 g/dL (ref 30.0–36.0)
MCV: 86.8 fL (ref 78.0–100.0)
Platelets: 441 10*3/uL — ABNORMAL HIGH (ref 150–400)
RBC: 2.58 MIL/uL — ABNORMAL LOW (ref 4.22–5.81)
RDW: 17.7 % — ABNORMAL HIGH (ref 11.5–15.5)
WBC: 8.2 10*3/uL (ref 4.0–10.5)

## 2015-05-27 LAB — PREPARE RBC (CROSSMATCH)

## 2015-05-27 LAB — GLUCOSE, CAPILLARY
GLUCOSE-CAPILLARY: 172 mg/dL — AB (ref 65–99)
Glucose-Capillary: 161 mg/dL — ABNORMAL HIGH (ref 65–99)

## 2015-05-27 LAB — MRSA PCR SCREENING: MRSA BY PCR: NEGATIVE

## 2015-05-27 LAB — PROTIME-INR
INR: 1.2 (ref 0.00–1.49)
PROTHROMBIN TIME: 15.4 s — AB (ref 11.6–15.2)

## 2015-05-27 MED ORDER — OXYCODONE-ACETAMINOPHEN 5-325 MG PO TABS
1.0000 | ORAL_TABLET | ORAL | Status: DC | PRN
  Administered 2015-05-28 – 2015-05-29 (×4): 1 via ORAL
  Filled 2015-05-27 (×5): qty 1

## 2015-05-27 MED ORDER — INSULIN ASPART 100 UNIT/ML ~~LOC~~ SOLN
0.0000 [IU] | Freq: Three times a day (TID) | SUBCUTANEOUS | Status: DC
  Administered 2015-05-27: 3 [IU] via SUBCUTANEOUS
  Administered 2015-05-28: 2 [IU] via SUBCUTANEOUS
  Administered 2015-05-28 (×2): 3 [IU] via SUBCUTANEOUS
  Administered 2015-05-29: 5 [IU] via SUBCUTANEOUS
  Administered 2015-05-29 – 2015-05-30 (×3): 3 [IU] via SUBCUTANEOUS

## 2015-05-27 MED ORDER — SODIUM CHLORIDE 0.9 % IV SOLN
Freq: Once | INTRAVENOUS | Status: AC
  Administered 2015-05-29: 21:00:00 via INTRAVENOUS

## 2015-05-27 MED ORDER — ACETAMINOPHEN 325 MG PO TABS
650.0000 mg | ORAL_TABLET | Freq: Once | ORAL | Status: AC
Start: 2015-05-27 — End: 2015-05-27
  Administered 2015-05-27: 650 mg via ORAL
  Filled 2015-05-27: qty 2

## 2015-05-27 MED ORDER — CARVEDILOL 12.5 MG PO TABS
12.5000 mg | ORAL_TABLET | Freq: Two times a day (BID) | ORAL | Status: DC
  Administered 2015-05-27 – 2015-05-30 (×6): 12.5 mg via ORAL
  Filled 2015-05-27 (×6): qty 1

## 2015-05-27 MED ORDER — PANTOPRAZOLE SODIUM 40 MG IV SOLR
40.0000 mg | Freq: Two times a day (BID) | INTRAVENOUS | Status: DC
  Administered 2015-05-27 – 2015-05-30 (×6): 40 mg via INTRAVENOUS
  Filled 2015-05-27 (×6): qty 40

## 2015-05-27 MED ORDER — ATORVASTATIN CALCIUM 10 MG PO TABS
10.0000 mg | ORAL_TABLET | Freq: Every day | ORAL | Status: DC
  Administered 2015-05-28 – 2015-05-30 (×3): 10 mg via ORAL
  Filled 2015-05-27 (×3): qty 1

## 2015-05-27 MED ORDER — ZOLPIDEM TARTRATE 5 MG PO TABS
5.0000 mg | ORAL_TABLET | Freq: Every evening | ORAL | Status: DC | PRN
  Administered 2015-05-27: 5 mg via ORAL
  Filled 2015-05-27: qty 1

## 2015-05-27 MED ORDER — PANTOPRAZOLE SODIUM 40 MG IV SOLR
40.0000 mg | Freq: Once | INTRAVENOUS | Status: AC
  Administered 2015-05-27: 40 mg via INTRAVENOUS
  Filled 2015-05-27: qty 40

## 2015-05-27 MED ORDER — TAMSULOSIN HCL 0.4 MG PO CAPS
0.4000 mg | ORAL_CAPSULE | Freq: Every day | ORAL | Status: DC
  Administered 2015-05-27 – 2015-05-30 (×4): 0.4 mg via ORAL
  Filled 2015-05-27 (×4): qty 1

## 2015-05-27 MED ORDER — BRIMONIDINE TARTRATE 0.2 % OP SOLN
1.0000 [drp] | Freq: Every day | OPHTHALMIC | Status: DC
  Administered 2015-05-28 – 2015-05-30 (×3): 1 [drp] via OPHTHALMIC
  Filled 2015-05-27: qty 5

## 2015-05-27 MED ORDER — PREDNISOLONE ACETATE 1 % OP SUSP
1.0000 [drp] | Freq: Two times a day (BID) | OPHTHALMIC | Status: DC
  Administered 2015-05-27 – 2015-05-30 (×6): 1 [drp] via OPHTHALMIC
  Filled 2015-05-27: qty 1

## 2015-05-27 MED ORDER — METOPROLOL TARTRATE 1 MG/ML IV SOLN
2.5000 mg | Freq: Once | INTRAVENOUS | Status: AC
  Administered 2015-05-27: 2.5 mg via INTRAVENOUS
  Filled 2015-05-27: qty 5

## 2015-05-27 MED ORDER — TIMOLOL MALEATE 0.5 % OP SOLN
1.0000 [drp] | Freq: Every day | OPHTHALMIC | Status: DC
  Administered 2015-05-29 – 2015-05-30 (×2): 1 [drp] via OPHTHALMIC
  Filled 2015-05-27: qty 5

## 2015-05-27 MED ORDER — ABIRATERONE ACETATE 250 MG PO TABS
1000.0000 mg | ORAL_TABLET | Freq: Every day | ORAL | Status: DC
  Administered 2015-05-27 – 2015-05-29 (×3): 1000 mg via ORAL
  Filled 2015-05-27: qty 4

## 2015-05-27 MED ORDER — SODIUM CHLORIDE 0.9 % IV SOLN
INTRAVENOUS | Status: AC
  Administered 2015-05-28: 09:00:00 via INTRAVENOUS

## 2015-05-27 MED ORDER — DILTIAZEM HCL ER BEADS 240 MG PO CP24
360.0000 mg | ORAL_CAPSULE | Freq: Every day | ORAL | Status: DC
  Administered 2015-05-28 – 2015-05-30 (×3): 360 mg via ORAL
  Filled 2015-05-27 (×3): qty 1

## 2015-05-27 MED ORDER — BRIMONIDINE TARTRATE-TIMOLOL 0.2-0.5 % OP SOLN: 1.0000 [drp] | Freq: Every day | OPHTHALMIC | Status: DC

## 2015-05-27 NOTE — H&P (Signed)
History and Physical  Jeremy Jennings UPJ:[id number] DOB: 02-04-1936 DOA: 05/27/2015  Referring physician: EDP PCP: [name], MD   Chief Complaint: blood in the stool  HPI: Jeremy Jennings is a 79 y.o. male  With prostate cancer with bone mets on zytiga and androgen deprivation therapy with monthly xgeva presented to the ED due to blood in the stool, he reported four episodes diarrhea with bright color stool and one episode while in the ED, he denies ab pain, no n/v, no fever, last normal bm was 3-4 days ago, reported has been taking motrin bid for the last 66month due to bone pain. He received blood transfusion a week ago for anemia, today, hgb 7.1 , he was tachycardia upon arrival to the ED, EDP ordered two unit of prbc, GI consulted by EDP, recommended npo after midnight and possible scope in am, hospitalist called to admit the patient.  Patient denies fever,no sob, no chest pain, reported baseline activity is limited due to pain, does still drive.    Review of Systems:  Detail per HPI, Review of systems are otherwise negative  Past Medical History  Diagnosis Date  . Hypertension   . Diabetes mellitus   . Heart murmur     SYSTOLIC MURMUR AT BASE  . Hyperlipidemia   . Prostate cancer ([facility]    Past Surgical History  Procedure Laterality Date  . [facility]      [facility]  [facility]  . Hernia repair  AFE 16-17  . [place]  Social History:  reports that he quit smoking about 35 years ago. His smoking use included Cigarettes. He has a 5 pack-year smoking history. He has never used smokeless tobacco. He reports that he does not drink alcohol or use illicit drugs. 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  No Known Allergies  Family History  Problem Relation Age of Onset  . Cancer Neg Hx   . Kidney failure Brother   . Kidney  failure Sister       Prior to Admission medications   Medication Sig Start Date End Date Taking? Authorizing Provider  abiraterone Acetate (ZYTIGA) 250 MG tablet Take 4 tablets (1,000 mg total) by mouth daily. Take on an empty stomach 1 hour before or 2 hours after a meal 05/22/15  Yes FWyatt Portela MD  acetaminophen (TYLENOL) 650 MG CR tablet Take 1,300 mg by mouth every 8 (eight) hours as needed for pain.   Yes Historical Provider, MD  aliskiren (TEKTURNA) 150 MG tablet Take 1 tablet (150 mg total) by mouth daily. 12/26/14  Yes AElayne Snare MD  atorvastatin (LIPITOR) 10 MG tablet take 1 tablet by mouth once daily 07/09/14  Yes TDarlin Coco MD  carvedilol (COREG) 12.5 MG tablet Take 1 tablet (12.5 mg total) by mouth 2 (two) times daily with a meal. 08/19/14  Yes TDarlin Coco MD  Cholecalciferol (VITAMIN D PO) Take 5,000 Units by mouth daily. Taking 5000 daily   Yes Historical Provider, MD  COMBIGAN 0.2-0.5 % ophthalmic solution Place 1 drop into the right eye daily.  04/09/13  Yes Historical Provider, MD  Cyanocobalamin (VITAMIN B-12 PO) Take 1 tablet by mouth daily.   Yes Historical Provider, MD  diltiazem (TIAZAC) 360 MG 24 hr capsule Take 360 mg by mouth daily.     Yes  Historical Provider, MD  docusate sodium (COLACE) 100 MG capsule Take 100 mg by mouth daily.    Yes Historical Provider, MD  guaiFENesin (ROBITUSSIN) 100 MG/5ML liquid Take 200 mg by mouth 3 (three) times daily as needed for cough.   Yes Historical Provider, MD  ibuprofen (ADVIL,MOTRIN) 200 MG tablet Take 400 mg by mouth 2 (two) times daily as needed for headache or moderate pain.   Yes Historical Provider, MD  insulin aspart (NOVOLOG FLEXPEN) 100 UNIT/ML FlexPen Inject 12 units 3 times per day with meals 12/05/14  Yes Elayne Snare, MD  Insulin Detemir (LEVEMIR) 100 UNIT/ML Pen Inject 12 Units into the skin daily at 10 pm.   Yes Historical Provider, MD  Liraglutide 18 MG/3ML SOPN Inject 1.8 mg into the skin daily. 06/09/14   Yes Elayne Snare, MD  megestrol (MEGACE) 400 MG/10ML suspension Take 10 mLs (400 mg total) by mouth 2 (two) times daily. 05/18/15  Yes Wyatt Portela, MD  pioglitazone (ACTOS) 30 MG tablet Take 1 tablet (30 mg total) by mouth daily. 01/19/15  Yes Elayne Snare, MD  prednisoLONE acetate (PRED FORTE) 1 % ophthalmic suspension Place 1 drop into both eyes 2 (two) times daily.  04/25/13  Yes Historical Provider, MD  ACCU-CHEK SOFTCLIX LANCETS lancets Use as instructed to check blood sugar 2 times per day dx code E11.65 10/23/14   Elayne Snare, MD  Blood Glucose Monitoring Suppl (ACCU-CHEK AVIVA PLUS) W/DEVICE KIT Use to check blood sugar 2 times per day dx code E11.65 10/23/14   Elayne Snare, MD  glucose blood (ACCU-CHEK AVIVA PLUS) test strip Use as instructed to check blood sugar 2 times per day dx code E11.65 10/23/14   Elayne Snare, MD  Insulin Pen Needle (B-D UF III MINI PEN NEEDLES) 31G X 5 MM MISC 1 each by Does not apply route 2 (two) times daily. 04/16/14   Elayne Snare, MD  oxyCODONE-acetaminophen (PERCOCET/ROXICET) 5-325 MG per tablet Take 1 tablet by mouth every 4 (four) hours as needed for severe pain. 11/11/14   Silver Huguenin Elgergawy, MD  tamsulosin (FLOMAX) 0.4 MG CAPS capsule Take 0.4 mg by mouth daily.  08/02/13   Historical Provider, MD    Physical Exam: BP 170/67 mmHg  Pulse 113  Temp(Src) 97.8 F (36.6 C) (Oral)  Resp 18  SpO2 96%  General:  Weak, but NAD Eyes: PERRL ENT: unremarkable Neck: supple, no JVD Cardiovascular: sinus tachycardia Respiratory: CTABL Abdomen: soft/ND/ND, positive bowel sounds Skin: no rash Musculoskeletal:  Mild pitting edema bilateral lower extremity Psychiatric: calm/cooperative Neurologic: no focal findings            Labs on Admission:  Basic Metabolic Panel:  Recent Labs Lab 05/27/15 1556  NA 139  K 3.8  CL 110  CO2 22  GLUCOSE 230*  BUN 25*  CREATININE 0.92  CALCIUM 8.0*   Liver Function Tests:  Recent Labs Lab 05/27/15 1556  AST 14*  ALT 10*   ALKPHOS 500*  BILITOT 0.5  PROT 6.1*  ALBUMIN 2.3*   No results for input(s): LIPASE, AMYLASE in the last 168 hours. No results for input(s): AMMONIA in the last 168 hours. CBC:  Recent Labs Lab 05/27/15 1556  WBC 8.2  HGB 7.1*  HCT 22.4*  MCV 86.8  PLT 441*   Cardiac Enzymes: No results for input(s): CKTOTAL, CKMB, CKMBINDEX, TROPONINI in the last 168 hours.  BNP (last 3 results) No results for input(s): BNP in the last 8760 hours.  ProBNP (last 3 results) No  results for input(s): PROBNP in the last 8760 hours.  CBG: No results for input(s): GLUCAP in the last 168 hours.  Radiological Exams on Admission: No results found.  EKG: Independently reviewed. Sinus tachycardia on monitor  Assessment/Plan Present on Admission:  . Acute GI bleeding  GI bleed, pain less, source unclear,  could be from upper GI due to NSAIDs use, could be from hemorrhoids/ diverticula. No pain, no n/v. Continue iv ppi bid, full liquid diet for now, npo after midnight, possible scope in am per GI.  Symptomatic anemia: with sinus tachycardia, prbcx2units , admit to stepdown.  Htn: continue home bp meds  Insulin dependent diabetes: check a1c, start ssi, will hold long acting levemir for now due to npo after midnight  Metastatic prostate ca to bone: continue daily zytiga, defer to outpatient oncology and urology.     DVT prophylaxis: scd's  Consultants: [facility], Dr. Watt Climes contacted by EDP  Code Status: DNR, confirmed with the patient  Family Communication:  Patient   Disposition Plan: admit to stepdown  Time spent: 76mns  Zakhari Fogel MD, PhD [facility] Pager [phone] 7PM-7AM, please contact night-coverage at [email or web address], password [email or web address]

## 2015-05-27 NOTE — ED Notes (Signed)
Questions please call daughter [name] at [phone].

## 2015-05-27 NOTE — ED Notes (Addendum)
Pt c/o diarrhea and bright red rectal bleeding starting today.  Denies pain.  Pt reports bleeding w/ bowel movements.  Denies chemo and radiation.  Pt reports that he takes "medicine" for his prostate CA.  Pt reports he received 2 units of blood x 8 days ago.

## 2015-05-27 NOTE — ED Notes (Signed)
Pt brought stool sample from home, Dr Alvino Chapel has seen sample.

## 2015-05-27 NOTE — ED Provider Notes (Signed)
CSN: [id number]     Arrival date & time 05/27/15  1503 History   First MD Initiated Contact with Patient 05/27/15 1514     Chief Complaint  Patient presents with  . CA Pt   . Rectal Bleeding  . Diarrhea     (Consider location/radiation/quality/duration/timing/severity/associated sxs/prior Treatment) Patient is a 79 y.o. male presenting with hematochezia and diarrhea.  Rectal Bleeding Associated symptoms: abdominal pain   Diarrhea Associated symptoms: abdominal pain   Associated symptoms: no headaches    patient presents with around 4 episodes of bloody diarrhea today. States is very foul-smelling and dark. He brings a sample of blood in a plastic container. Appears to be melanoma. States he's felt a little lightheaded and had some abdominal cramping. No fevers. No trauma. States he has had anemia requiring transfusion the past there were unable to determine where it come from. He is currently being treated for prostate cancer.  Past Medical History  Diagnosis Date  . Hypertension   . Diabetes mellitus   . Heart murmur     SYSTOLIC MURMUR AT BASE  . Hyperlipidemia   . Prostate cancer [facility])    Past Surgical History  Procedure Laterality Date  . Tonsillectomy      IN [facility]  . Hemorroidectomy  1969  . Hernia repair  AFE 16-17  . Hernia repair  1981    DR [place]   Family History  Problem Relation Age of Onset  . Cancer Neg Hx   . Kidney failure Brother   . Kidney failure Sister    Social History  Substance Use Topics  . Smoking status: Former Smoker -- 0.25 packs/day for 20 years    Types: Cigarettes    Quit date: 07/12/1979  . Smokeless tobacco: Never Used  . Alcohol Use: No    Review of Systems  Constitutional: Positive for fatigue. Negative for appetite change.  Gastrointestinal: Positive for abdominal pain, diarrhea, blood in stool and hematochezia.  Genitourinary: Negative for flank pain.  Musculoskeletal: Negative for back pain.  Skin: Positive for pallor.   Neurological: Negative for headaches.  Hematological: Negative for adenopathy.  Psychiatric/Behavioral: Negative for confusion.      Allergies  Review of patient's allergies indicates no known allergies.  Home Medications   Prior to Admission medications   Medication Sig Start Date End Date Taking? Authorizing Provider  abiraterone Acetate (ZYTIGA) 250 MG tablet Take 4 tablets (1,000 mg total) by mouth daily. Take on an empty stomach 1 hour before or 2 hours after a meal 05/22/15  Yes Wyatt Portela, MD  acetaminophen (TYLENOL) 650 MG CR tablet Take 1,300 mg by mouth every 8 (eight) hours as needed for pain.   Yes Historical Provider, MD  aliskiren (TEKTURNA) 150 MG tablet Take 1 tablet (150 mg total) by mouth daily. 12/26/14  Yes Elayne Snare, MD  atorvastatin (LIPITOR) 10 MG tablet take 1 tablet by mouth once daily 07/09/14  Yes Darlin Coco, MD  carvedilol (COREG) 12.5 MG tablet Take 1 tablet (12.5 mg total) by mouth 2 (two) times daily with a meal. 08/19/14  Yes Darlin Coco, MD  Cholecalciferol (VITAMIN D PO) Take 5,000 Units by mouth daily. Taking 5000 daily   Yes Historical Provider, MD  COMBIGAN 0.2-0.5 % ophthalmic solution Place 1 drop into the right eye daily.  04/09/13  Yes Historical Provider, MD  Cyanocobalamin (VITAMIN B-12 PO) Take 1 tablet by mouth daily.   Yes Historical Provider, MD  diltiazem (TIAZAC) 360 MG 24 hr capsule  Take 360 mg by mouth daily.     Yes Historical Provider, MD  docusate sodium (COLACE) 100 MG capsule Take 100 mg by mouth daily.    Yes Historical Provider, MD  guaiFENesin (ROBITUSSIN) 100 MG/5ML liquid Take 200 mg by mouth 3 (three) times daily as needed for cough.   Yes Historical Provider, MD  ibuprofen (ADVIL,MOTRIN) 200 MG tablet Take 400 mg by mouth 2 (two) times daily as needed for headache or moderate pain.   Yes Historical Provider, MD  insulin aspart (NOVOLOG FLEXPEN) 100 UNIT/ML FlexPen Inject 12 units 3 times per day with meals 12/05/14   Yes Elayne Snare, MD  Insulin Detemir (LEVEMIR) 100 UNIT/ML Pen Inject 12 Units into the skin daily at 10 pm.   Yes Historical Provider, MD  Liraglutide 18 MG/3ML SOPN Inject 1.8 mg into the skin daily. 06/09/14  Yes Elayne Snare, MD  megestrol (MEGACE) 400 MG/10ML suspension Take 10 mLs (400 mg total) by mouth 2 (two) times daily. 05/18/15  Yes Wyatt Portela, MD  pioglitazone (ACTOS) 30 MG tablet Take 1 tablet (30 mg total) by mouth daily. 01/19/15  Yes Elayne Snare, MD  prednisoLONE acetate (PRED FORTE) 1 % ophthalmic suspension Place 1 drop into both eyes 2 (two) times daily.  04/25/13  Yes Historical Provider, MD  ACCU-CHEK SOFTCLIX LANCETS lancets Use as instructed to check blood sugar 2 times per day dx code E11.65 10/23/14   Elayne Snare, MD  Blood Glucose Monitoring Suppl (ACCU-CHEK AVIVA PLUS) W/DEVICE KIT Use to check blood sugar 2 times per day dx code E11.65 10/23/14   Elayne Snare, MD  glucose blood (ACCU-CHEK AVIVA PLUS) test strip Use as instructed to check blood sugar 2 times per day dx code E11.65 10/23/14   Elayne Snare, MD  Insulin Pen Needle (B-D UF III MINI PEN NEEDLES) 31G X 5 MM MISC 1 each by Does not apply route 2 (two) times daily. 04/16/14   Elayne Snare, MD  oxyCODONE-acetaminophen (PERCOCET/ROXICET) 5-325 MG per tablet Take 1 tablet by mouth every 4 (four) hours as needed for severe pain. 11/11/14   Silver Huguenin Elgergawy, MD  tamsulosin (FLOMAX) 0.4 MG CAPS capsule Take 0.4 mg by mouth daily.  08/02/13   Historical Provider, MD   BP 170/67 mmHg  Pulse 113  Temp(Src) 97.8 F (36.6 C) (Oral)  Resp 18  SpO2 96% Physical Exam  Constitutional: He appears well-developed.  HENT:  Head: Atraumatic.  Neck: Neck supple.  Cardiovascular:  Mild tachycardia  Pulmonary/Chest: Effort normal.  Abdominal: Soft. There is no tenderness. There is no rebound.  Musculoskeletal: Normal range of motion.  Neurological: He is alert.  Skin: There is pallor.    ED Course  Procedures (including critical  care time) Labs Review Labs Reviewed  COMPREHENSIVE METABOLIC PANEL - Abnormal; Notable for the following:    Glucose, Bld 230 (*)    BUN 25 (*)    Calcium 8.0 (*)    Total Protein 6.1 (*)    Albumin 2.3 (*)    AST 14 (*)    ALT 10 (*)    Alkaline Phosphatase 500 (*)    All other components within normal limits  CBC - Abnormal; Notable for the following:    RBC 2.58 (*)    Hemoglobin 7.1 (*)    HCT 22.4 (*)    RDW 17.7 (*)    Platelets 441 (*)    All other components within normal limits  PROTIME-INR - Abnormal; Notable for the following:  Prothrombin Time 15.4 (*)    All other components within normal limits  TYPE AND SCREEN  PREPARE RBC (CROSSMATCH)    Imaging Review No results found. I have personally reviewed and evaluated these images and lab results as part of my medical decision-making.   EKG Interpretation None      MDM   Final diagnoses:  Upper GI bleed  Anemia, unspecified anemia type    Patient with GI bleeding. Likely upper GI due to melanoma and NSAID use. Hemoglobin is 7 which she was a week ago and has had 2 units of blood transfused for that. Likely has lost at least once again. Mild tachycardia. Protonix IV given. Will admit to stepdown bed to internal medicine. Discussed with Dr. Watt Climes from gastroenterology, who will see the patient tomorrow with likely scope tomorrow. Nothing by mouth at midnight. If patient continues to bleed and appears worse May call tonight and have to have more expeditious scope.  CRITICAL CARE Performed by: Mackie Pai Total critical care time: 30 minutes Critical care time was exclusive of separately billable procedures and treating other patients. Critical care was necessary to treat or prevent imminent or life-threatening deterioration. Critical care was time spent personally by me on the following activities: development of treatment plan with patient and/or surrogate as well as nursing, discussions with  consultants, evaluation of patient's response to treatment, examination of patient, obtaining history from patient or surrogate, ordering and performing treatments and interventions, ordering and review of laboratory studies, ordering and review of radiographic studies, pulse oximetry and re-evaluation of patient's condition.    Davonna Belling, MD 05/27/15 [phone]

## 2015-05-28 ENCOUNTER — Encounter (HOSPITAL_COMMUNITY)
Admission: EM | Disposition: A | Discharge status: Home Health | Payer: Self-pay | Source: Home / Self Care | Attending: Internal Medicine

## 2015-05-28 ENCOUNTER — Encounter (HOSPITAL_COMMUNITY): Payer: Self-pay

## 2015-05-28 HISTORY — PX: ESOPHAGOGASTRODUODENOSCOPY: SHX5428

## 2015-05-28 LAB — COMPREHENSIVE METABOLIC PANEL
ALBUMIN: 2.1 g/dL — AB (ref 3.5–5.0)
ALK PHOS: 403 U/L — AB (ref 38–126)
ALT: 9 U/L — ABNORMAL LOW (ref 17–63)
AST: 14 U/L — AB (ref 15–41)
Anion gap: 9 (ref 5–15)
BILIRUBIN TOTAL: 0.9 mg/dL (ref 0.3–1.2)
BUN: 21 mg/dL — AB (ref 6–20)
CALCIUM: 7.4 mg/dL — AB (ref 8.9–10.3)
CO2: 21 mmol/L — AB (ref 22–32)
Chloride: 109 mmol/L (ref 101–111)
Creatinine, Ser: 0.79 mg/dL (ref 0.61–1.24)
GFR calc Af Amer: >60 mL/min (ref >60)
GFR calc non Af Amer: >60 mL/min (ref >60)
GLUCOSE: 216 mg/dL — AB (ref 65–99)
POTASSIUM: 3.6 mmol/L (ref 3.5–5.1)
SODIUM: 139 mmol/L (ref 135–145)
TOTAL PROTEIN: 5.5 g/dL — AB (ref 6.5–8.1)

## 2015-05-28 LAB — GLUCOSE, CAPILLARY
GLUCOSE-CAPILLARY: 150 mg/dL — AB (ref 65–99)
GLUCOSE-CAPILLARY: 180 mg/dL — AB (ref 65–99)
Glucose-Capillary: 197 mg/dL — ABNORMAL HIGH (ref 65–99)
Glucose-Capillary: 162 mg/dL — ABNORMAL HIGH (ref 65–99)

## 2015-05-28 LAB — TSH: TSH: 2.837 u[IU]/mL (ref 0.350–4.500)

## 2015-05-28 LAB — CBC
HEMATOCRIT: 25 % — AB (ref 39.0–52.0)
HEMOGLOBIN: 8.3 g/dL — AB (ref 13.0–17.0)
MCH: 29 pg (ref 26.0–34.0)
MCHC: 33.2 g/dL (ref 30.0–36.0)
MCV: 87.4 fL (ref 78.0–100.0)
Platelets: 387 10*3/uL (ref 150–400)
RBC: 2.86 MIL/uL — AB (ref 4.22–5.81)
RDW: 16.2 % — ABNORMAL HIGH (ref 11.5–15.5)
WBC: 10 10*3/uL (ref 4.0–10.5)

## 2015-05-28 LAB — PROTIME-INR
INR: 1.27 (ref 0.00–1.49)
Prothrombin Time: 16 seconds — ABNORMAL HIGH (ref 11.6–15.2)

## 2015-05-28 SURGERY — EGD (ESOPHAGOGASTRODUODENOSCOPY)
Anesthesia: Moderate Sedation

## 2015-05-28 MED ORDER — FENTANYL CITRATE (PF) 100 MCG/2ML IJ SOLN
INTRAMUSCULAR | Status: AC
  Filled 2015-05-28: qty 2

## 2015-05-28 MED ORDER — MIDAZOLAM HCL 10 MG/2ML IJ SOLN
INTRAMUSCULAR | Status: DC | PRN
  Administered 2015-05-28: 2 mg via INTRAVENOUS

## 2015-05-28 MED ORDER — BUTAMBEN-TETRACAINE-BENZOCAINE 2-2-14 % EX AERO
INHALATION_SPRAY | CUTANEOUS | Status: DC | PRN
  Administered 2015-05-28: 2 via TOPICAL

## 2015-05-28 MED ORDER — OXYCODONE HCL 5 MG PO TABS
5.0000 mg | ORAL_TABLET | ORAL | Status: DC | PRN
  Administered 2015-05-28 – 2015-05-30 (×3): 5 mg via ORAL
  Filled 2015-05-28 (×3): qty 1

## 2015-05-28 MED ORDER — MIDAZOLAM HCL 5 MG/ML IJ SOLN
INTRAMUSCULAR | Status: AC
  Filled 2015-05-28: qty 2

## 2015-05-28 MED ORDER — FENTANYL CITRATE (PF) 100 MCG/2ML IJ SOLN
INTRAMUSCULAR | Status: DC | PRN
  Administered 2015-05-28: 25 ug via INTRAVENOUS

## 2015-05-28 MED ORDER — SODIUM CHLORIDE 0.9 % IV SOLN
INTRAVENOUS | Status: DC
  Administered 2015-05-28 – 2015-05-29 (×3): via INTRAVENOUS

## 2015-05-28 NOTE — Progress Notes (Signed)
PROGRESS NOTE  Jeremy Jennings [id number] DOB: 1935/07/25 DOA: 05/27/2015 PCP: [name], MD  HPI/Recap of past 24 hours:  No more bloody stool overnight, denies ab pain, no n/v, tachycardia has improved  Assessment/Plan: Active Problems:   Acute GI bleeding   GI bleed  GI bleed, no abdominal pain, source unclear, could be from upper GI due to NSAIDs use, could be from hemorrhoids/ diverticula. No pain, no n/v. Continue iv ppi bid,   I have talked to Dr. Amedeo Plenty from [place], he has the patient scheduled to have endoscope at 3pm, Dr. Amedeo Plenty is ok for the patient to have clear liquid for breakfast then npo.  Symptomatic anemia: with sinus tachycardia, prbcx2units , admit to stepdown.  Htn: continue home bp meds  Insulin dependent diabetes: check a1c, start ssi, will hold long acting levemir for now due to npo after midnight  Metastatic prostate ca to bone: continue daily zytiga, defer to outpatient oncology and urology.   DVT prophylaxis: scd's  Consultants: [facility]  Code Status: DNR, confirmed with the patient  Family Communication: Patient and daughter  Disposition Plan: likely able to move out of  Stepdown after endoscope , likely home in 1-2 days    Procedures:  EGD 3pm on 11/17  Antibiotics:  none   Objective: BP 169/59 mmHg  Pulse 108  Temp(Src) 98 F (36.7 C) (Oral)  Resp 20  Ht 6' (1.829 m)  Wt 199 lb 15.3 oz (90.7 kg)  BMI 27.11 kg/m2  SpO2 92%  Intake/Output Summary (Last 24 hours) at 05/28/15 0807 Last data filed at 05/28/15 [id number]  Gross per 24 hour  Intake 1103.33 ml  Output    425 ml  Net 678.33 ml   Filed Weights   05/27/15 1826  Weight: 199 lb 15.3 oz (90.7 kg)    Exam:   General:  NAD  Cardiovascular: sinus tachycardia, but much improved  Respiratory: CTABL  Abdomen: Soft/ND/NT, positive BS  Musculoskeletal: No Edema  Neuro: aaox3  Data Reviewed: Basic Metabolic Panel:  Recent Labs Lab 05/27/15 1556  05/28/15 0350  NA 139 139  K 3.8 3.6  CL 110 109  CO2 22 21*  GLUCOSE 230* 216*  BUN 25* 21*  CREATININE 0.92 0.79  CALCIUM 8.0* 7.4*   Liver Function Tests:  Recent Labs Lab 05/27/15 1556 05/28/15 0350  AST 14* 14*  ALT 10* 9*  ALKPHOS 500* 403*  BILITOT 0.5 0.9  PROT 6.1* 5.5*  ALBUMIN 2.3* 2.1*   No results for input(s): LIPASE, AMYLASE in the last 168 hours. No results for input(s): AMMONIA in the last 168 hours. CBC:  Recent Labs Lab 05/27/15 1556 05/28/15 0350  WBC 8.2 10.0  HGB 7.1* 8.3*  HCT 22.4* 25.0*  MCV 86.8 87.4  PLT 441* 387   Cardiac Enzymes:   No results for input(s): CKTOTAL, CKMB, CKMBINDEX, TROPONINI in the last 168 hours. BNP (last 3 results) No results for input(s): BNP in the last 8760 hours.  ProBNP (last 3 results) No results for input(s): PROBNP in the last 8760 hours.  CBG:  Recent Labs Lab 05/27/15 1833 05/27/15 2200  GLUCAP 172* 161*    Recent Results (from the past 240 hour(s))  MRSA PCR Screening     Status: None   Collection Time: 05/27/15  6:20 PM  Result Value Ref Range Status   MRSA by PCR NEGATIVE NEGATIVE Final    Comment:        The GeneXpert MRSA Assay (FDA approved  for NASAL specimens only), is one component of a comprehensive MRSA colonization surveillance program. It is not intended to diagnose MRSA infection nor to guide or monitor treatment for MRSA infections.      Studies: No results found.  Scheduled Meds: . sodium chloride   Intravenous STAT  . sodium chloride   Intravenous Once  . abiraterone Acetate  1,000 mg Oral QHS  . atorvastatin  10 mg Oral Daily  . brimonidine  1 drop Right Eye Daily   And  . timolol  1 drop Right Eye Daily  . carvedilol  12.5 mg Oral BID WC  . diltiazem  360 mg Oral Daily  . insulin aspart  0-15 Units Subcutaneous TID WC  . pantoprazole (PROTONIX) IV  40 mg Intravenous Q12H  . prednisoLONE acetate  1 drop Both Eyes BID  . tamsulosin  0.4 mg Oral Daily     Continuous Infusions:    Time spent: 36mins  Nessa Ramaker MD, PhD  [facility] Pager [phone]. If 7PM-7AM, please contact night-coverage at [email or web address], password [facility] 05/28/2015, 8:07 AM  LOS: 1 day

## 2015-05-28 NOTE — Care Management Note (Signed)
Case Management Note  Patient Details  Name: NAHIM SWINEY MRN: [id number] Date of Birth: 08-Mar-1936  Subjective/Objective:              Anemia and rectal bleeding      Action/Plan:Date: May 28, 2015 Chart reviewed for concurrent status and case management needs. Will continue to follow patient for changes and needs: Velva Harman, RN, BSN, [place]   [phone]   Expected Discharge Date:   (unknown)               Expected Discharge Plan:  Home/Self Care  In-House Referral:  NA  Discharge planning Services  CM Consult  Post Acute Care Choice:  NA Choice offered to:     DME Arranged:    DME Agency:     HH Arranged:    [place] Agency:     Status of Service:  In process, will continue to follow  Medicare Important Message Given:    Date Medicare IM Given:    Medicare IM give by:    Date Additional Medicare IM Given:    Additional Medicare Important Message give by:     If discussed at [place] of Stay Meetings, dates discussed:    Additional Comments:  Leeroy Cha, RN 05/28/2015, 10:40 AM

## 2015-05-28 NOTE — Op Note (Signed)
[facility] [place], [address]   ENDOSCOPY PROCEDURE REPORT  PATIENT: Jeremy, Jennings  MR#: [id number] BIRTHDATE: Jun 27, 1936 , 4  yrs. old GENDER: male ENDOSCOPIST: [name], MD REFERRED BY: PROCEDURE DATE:  31-May-2015 PROCEDURE: ASA CLASS: INDICATIONS:  rectal bleeding MEDICATIONS: fentanyl 50 g, Versed 4 mg TOPICAL ANESTHETIC: Cetacaine spray.  DESCRIPTION OF PROCEDURE: After the risks benefits and alternatives of the procedure were thoroughly explained, informed consent was obtained.  The [place] [id number] endoscope was introduced through the mouth and advanced to the second portion of the duodenum , Without limitations.  The instrument was slowly withdrawn as the mucosa was fully examined. Estimated blood loss is zero unless otherwise noted in this procedure report.    esophagus: Mild diffuse patchy exudate in the distal two thirds consistent with a mild erosive esophagitis, no stigmata of hemorrhage.Moderate size sliding hiatal hernia stomach: Normal Duodenum: Normal              The scope was then withdrawn from the patient and the procedure completed.  COMPLICATIONS: There were no immediate complications.  ENDOSCOPIC IMPRESSION: mild distal esophagitis with small hiatal hernia otherwise normal study.  RECOMMENDATIONS: monitor stools and hemoglobin, transfuse as necessary. Hold any anticoagulants and will try to obtain last colonoscopy report, consider flexible sigmoidoscopy or colonoscopy if bleeding does not cease withconservative therapy.  REPEAT EXAM:  eSigned:  [name], MD 05-31-2015 2:59 PM    CC:  CPT CODES: ICD CODES:  The ICD and CPT codes recommended by this software are interpretations from the data that the clinical staff has captured with the software.  The verification of the translation of this report to the ICD and CPT codes and modifiers is the sole responsibility of the health care  institution and practicing physician where this report was generated.  [employer]. will not be held responsible for the validity of the ICD and CPT codes included on this report.  AMA assumes no liability for data contained or not contained herein. CPT is a Designer, television/film set of the [employer].

## 2015-05-28 NOTE — Consult Note (Signed)
[facility] Consult Note  Referring Provider: No ref. provider found Primary Care Physician:  [name], MD Primary Gastroenterologist:  Dr.  Laurel Dimmer Complaint: Rectal bleeding HPI: Jeremy Jennings is an 79 y.o. black male  with metastatic prostate cancer who presented with several episodes of bloody diarrhea some stool described as dark with a hemoglobin of 7.1 and tachycardia. Patient has been using Motrin for bony pain for metastatic prostate cancer. He does not recall ever having had an endoscopy before. He is currently alert and oriented no acute distress.`  Past Medical History  Diagnosis Date  . Hypertension   . Diabetes mellitus   . Heart murmur     SYSTOLIC MURMUR AT BASE  . Hyperlipidemia   . Prostate cancer [facility])     Past Surgical History  Procedure Laterality Date  . Tonsillectomy      IN [facility]  . Hemorroidectomy  1969  . Hernia repair  AFE 16-17  . Hernia repair  1981    DR WEATHERLY    Medications Prior to Admission  Medication Sig Dispense Refill  . abiraterone Acetate (ZYTIGA) 250 MG tablet Take 4 tablets (1,000 mg total) by mouth daily. Take on an empty stomach 1 hour before or 2 hours after a meal 120 tablet 0  . acetaminophen (TYLENOL) 650 MG CR tablet Take 1,300 mg by mouth every 8 (eight) hours as needed for pain.    Marland Kitchen aliskiren (TEKTURNA) 150 MG tablet Take 1 tablet (150 mg total) by mouth daily. 30 tablet 5  . atorvastatin (LIPITOR) 10 MG tablet take 1 tablet by mouth once daily 30 tablet 5  . carvedilol (COREG) 12.5 MG tablet Take 1 tablet (12.5 mg total) by mouth 2 (two) times daily with a meal. 60 tablet 5  . Cholecalciferol (VITAMIN D PO) Take 5,000 Units by mouth daily. Taking 5000 daily    . COMBIGAN 0.2-0.5 % ophthalmic solution Place 1 drop into the right eye daily.     . Cyanocobalamin (VITAMIN B-12 PO) Take 1 tablet by mouth daily.    Marland Kitchen diltiazem (TIAZAC) 360 MG 24 hr capsule Take 360 mg by mouth daily.      Marland Kitchen docusate sodium  (COLACE) 100 MG capsule Take 100 mg by mouth daily.     Marland Kitchen guaiFENesin (ROBITUSSIN) 100 MG/5ML liquid Take 200 mg by mouth 3 (three) times daily as needed for cough.    Marland Kitchen ibuprofen (ADVIL,MOTRIN) 200 MG tablet Take 400 mg by mouth 2 (two) times daily as needed for headache or moderate pain.    Marland Kitchen insulin aspart (NOVOLOG FLEXPEN) 100 UNIT/ML FlexPen Inject 12 units 3 times per day with meals 15 mL 3  . Insulin Detemir (LEVEMIR) 100 UNIT/ML Pen Inject 12 Units into the skin daily at 10 pm.    . Liraglutide 18 MG/3ML SOPN Inject 1.8 mg into the skin daily. 9 pen 1  . megestrol (MEGACE) 400 MG/10ML suspension Take 10 mLs (400 mg total) by mouth 2 (two) times daily. 240 mL 0  . pioglitazone (ACTOS) 30 MG tablet Take 1 tablet (30 mg total) by mouth daily. 90 tablet 1  . prednisoLONE acetate (PRED FORTE) 1 % ophthalmic suspension Place 1 drop into both eyes 2 (two) times daily.     Marland Kitchen ACCU-CHEK SOFTCLIX LANCETS lancets Use as instructed to check blood sugar 2 times per day dx code E11.65 100 each 3  . Blood Glucose Monitoring Suppl (ACCU-CHEK AVIVA PLUS) W/DEVICE KIT Use to check blood sugar 2 times per  day dx code E11.65 1 kit 0  . glucose blood (ACCU-CHEK AVIVA PLUS) test strip Use as instructed to check blood sugar 2 times per day dx code E11.65 100 each 3  . Insulin Pen Needle (B-D UF III MINI PEN NEEDLES) 31G X 5 MM MISC 1 each by Does not apply route 2 (two) times daily. 100 each 11  . oxyCODONE-acetaminophen (PERCOCET/ROXICET) 5-325 MG per tablet Take 1 tablet by mouth every 4 (four) hours as needed for severe pain. 30 tablet 0  . tamsulosin (FLOMAX) 0.4 MG CAPS capsule Take 0.4 mg by mouth daily.       Allergies: No Known Allergies  Family History  Problem Relation Age of Onset  . Cancer Neg Hx   . Kidney failure Brother   . Kidney failure Sister     Social History:  reports that he quit smoking about 35 years ago. His smoking use included Cigarettes. He has a 5 pack-year smoking history. He  has never used smokeless tobacco. He reports that he does not drink alcohol or use illicit drugs.  Review of Systems: negative except as above   Blood pressure 140/78, pulse 102, temperature 98.3 F (36.8 C), temperature source Oral, resp. rate 24, height 6' (1.829 m), weight 90.7 kg (199 lb 15.3 oz), SpO2 97 %. Head: Normocephalic, without obvious abnormality, atraumatic Neck: no adenopathy, no carotid bruit, no JVD, supple, symmetrical, trachea midline and thyroid not enlarged, symmetric, no tenderness/mass/nodules Resp: clear to auscultation bilaterally Cardio: regular rate and rhythm, S1, S2 normal, no murmur, click, rub or gallop GI: Abdomen soft nondistended with normoactive bowel sounds. No hepatomegaly masses or guarding Extremities: extremities normal, atraumatic, no cyanosis or edema  Results for orders placed or performed during the hospital encounter of 05/27/15 (from the past 48 hour(s))  Comprehensive metabolic panel     Status: Abnormal   Collection Time: 05/27/15  3:56 PM  Result Value Ref Range   Sodium 139 135 - 145 mmol/L   Potassium 3.8 3.5 - 5.1 mmol/L   Chloride 110 101 - 111 mmol/L   CO2 22 22 - 32 mmol/L   Glucose, Bld 230 (H) 65 - 99 mg/dL   BUN 25 (H) 6 - 20 mg/dL   Creatinine, Ser 0.92 0.61 - 1.24 mg/dL   Calcium 8.0 (L) 8.9 - 10.3 mg/dL   Total Protein 6.1 (L) 6.5 - 8.1 g/dL   Albumin 2.3 (L) 3.5 - 5.0 g/dL   AST 14 (L) 15 - 41 U/L   ALT 10 (L) 17 - 63 U/L   Alkaline Phosphatase 500 (H) 38 - 126 U/L   Total Bilirubin 0.5 0.3 - 1.2 mg/dL   GFR calc non Af Amer >60 >60 mL/min   GFR calc Af Amer >60 >60 mL/min    Comment: (NOTE) The eGFR has been calculated using the CKD EPI equation. This calculation has not been validated in all clinical situations. eGFR's persistently <60 mL/min signify possible Chronic Kidney Disease.    Anion gap 7 5 - 15  CBC     Status: Abnormal   Collection Time: 05/27/15  3:56 PM  Result Value Ref Range   WBC 8.2 4.0 -  10.5 K/uL   RBC 2.58 (L) 4.22 - 5.81 MIL/uL   Hemoglobin 7.1 (L) 13.0 - 17.0 g/dL   HCT 22.4 (L) 39.0 - 52.0 %   MCV 86.8 78.0 - 100.0 fL   MCH 27.5 26.0 - 34.0 pg   MCHC 31.7 30.0 - 36.0 g/dL  RDW 17.7 (H) 11.5 - 15.5 %   Platelets 441 (H) 150 - 400 K/uL  Type and screen [place]     Status: None   Collection Time: 05/27/15  3:56 PM  Result Value Ref Range   ABO/RH(D) B POS    Antibody Screen NEG    Sample Expiration 05/30/2015    Unit Number [id number]    Blood Component Type RED CELLS,LR    Unit division 00    Status of Unit ISSUED,FINAL    Transfusion Status OK TO TRANSFUSE    Crossmatch Result Compatible    Unit Number [id number]    Blood Component Type RED CELLS,LR    Unit division 00    Status of Unit ISSUED,FINAL    Transfusion Status OK TO TRANSFUSE    Crossmatch Result Compatible   Protime-INR     Status: Abnormal   Collection Time: 05/27/15  3:56 PM  Result Value Ref Range   Prothrombin Time 15.4 (H) 11.6 - 15.2 seconds   INR 1.20 0.00 - 1.49  Prepare RBC     Status: None   Collection Time: 05/27/15  5:41 PM  Result Value Ref Range   Order Confirmation ORDER PROCESSED BY BLOOD BANK   MRSA PCR Screening     Status: None   Collection Time: 05/27/15  6:20 PM  Result Value Ref Range   MRSA by PCR NEGATIVE NEGATIVE    Comment:        The GeneXpert MRSA Assay (FDA approved for NASAL specimens only), is one component of a comprehensive MRSA colonization surveillance program. It is not intended to diagnose MRSA infection nor to guide or monitor treatment for MRSA infections.   Glucose, capillary     Status: Abnormal   Collection Time: 05/27/15  6:33 PM  Result Value Ref Range   Glucose-Capillary 172 (H) 65 - 99 mg/dL   Comment 1 Notify RN    Comment 2 Document in Chart   Glucose, capillary     Status: Abnormal   Collection Time: 05/27/15 10:00 PM  Result Value Ref Range   Glucose-Capillary 161 (H) 65 - 99 mg/dL   Comment 1  Notify RN    Comment 2 Document in Chart   CBC     Status: Abnormal   Collection Time: 05/28/15  3:50 AM  Result Value Ref Range   WBC 10.0 4.0 - 10.5 K/uL   RBC 2.86 (L) 4.22 - 5.81 MIL/uL   Hemoglobin 8.3 (L) 13.0 - 17.0 g/dL   HCT 25.0 (L) 39.0 - 52.0 %   MCV 87.4 78.0 - 100.0 fL   MCH 29.0 26.0 - 34.0 pg   MCHC 33.2 30.0 - 36.0 g/dL   RDW 16.2 (H) 11.5 - 15.5 %   Platelets 387 150 - 400 K/uL  Protime-INR     Status: Abnormal   Collection Time: 05/28/15  3:50 AM  Result Value Ref Range   Prothrombin Time 16.0 (H) 11.6 - 15.2 seconds   INR 1.27 0.00 - 1.49  Comprehensive metabolic panel     Status: Abnormal   Collection Time: 05/28/15  3:50 AM  Result Value Ref Range   Sodium 139 135 - 145 mmol/L   Potassium 3.6 3.5 - 5.1 mmol/L   Chloride 109 101 - 111 mmol/L   CO2 21 (L) 22 - 32 mmol/L   Glucose, Bld 216 (H) 65 - 99 mg/dL   BUN 21 (H) 6 - 20 mg/dL   Creatinine, Ser 0.79 0.61 -  1.24 mg/dL   Calcium 7.4 (L) 8.9 - 10.3 mg/dL   Total Protein 5.5 (L) 6.5 - 8.1 g/dL   Albumin 2.1 (L) 3.5 - 5.0 g/dL   AST 14 (L) 15 - 41 U/L   ALT 9 (L) 17 - 63 U/L   Alkaline Phosphatase 403 (H) 38 - 126 U/L   Total Bilirubin 0.9 0.3 - 1.2 mg/dL   GFR calc non Af Amer >60 >60 mL/min   GFR calc Af Amer >60 >60 mL/min    Comment: (NOTE) The eGFR has been calculated using the CKD EPI equation. This calculation has not been validated in all clinical situations. eGFR's persistently <60 mL/min signify possible Chronic Kidney Disease.    Anion gap 9 5 - 15  Glucose, capillary     Status: Abnormal   Collection Time: 05/28/15  8:10 AM  Result Value Ref Range   Glucose-Capillary 197 (H) 65 - 99 mg/dL   Comment 1 Notify RN    Comment 2 Document in Chart   TSH     Status: None   Collection Time: 05/28/15  8:56 AM  Result Value Ref Range   TSH 2.837 0.350 - 4.500 uIU/mL  Glucose, capillary     Status: Abnormal   Collection Time: 05/28/15 12:34 PM  Result Value Ref Range   Glucose-Capillary  162 (H) 65 - 99 mg/dL   Comment 1 Notify RN    Comment 2 Document in Chart    No results found.  Assessment: GI bleeding unclear whether upper or lower but with the recent NSAID use. Plan:  We'll plan on endoscopy today and patient has been ordered for blood transfusion. Further recommendations to follow. Mykael Trott C 05/28/2015, 12:48 PM  Pager [phone] If no answer or after 5 PM call [phone]

## 2015-05-28 NOTE — Clinical Documentation Improvement (Signed)
[facility] Medicine [facility]  Can the diagnosis of anemia be further specified?   Acute blood loss anemia  Acute blood loss anemia on chronic anemia (please include suspected or known cause of chronic anemia)  Anemia of chronic disease, including the associated chronic disease state  Other  Clinically Undetermined  Supporting Information: Transfusion PRBC ordered  Component      RBC Hemoglobin HCT  Latest Ref Rng      4.22 - 5.81 MIL/uL 13.0 - 17.0 g/dL 39.0 - 52.0 %  05/27/2015     3:56 PM 2.58 (L) 7.1 (L) 22.4 (L)  05/28/2015     3:50 AM 2.86 (L) 8.3 (L) 25.0 (L)    Please exercise your independent, professional judgment when responding. A specific answer is not anticipated or expected. Please update your documentation within the medical record to reflect your response to this query.    Thank you, Mateo Flow, RN [phone] Clinical Documentation Specialist

## 2015-05-28 NOTE — Progress Notes (Signed)
Initial Nutrition Assessment  DOCUMENTATION CODES:   Severe malnutrition in context of chronic illness  INTERVENTION:   Diet advancement per MD When diet is advanced, recommend Glucerna Shake po TID, each supplement provides 220 kcal and 10 grams of protein RD to continue to monitor  NUTRITION DIAGNOSIS:   Malnutrition related to chronic illness as evidenced by percent weight loss, energy intake < or equal to 75% for > or equal to 1 month, severe depletion of body fat, severe depletion of muscle mass.  GOAL:   Patient will meet greater than or equal to 90% of their needs  MONITOR:   Diet advancement, Labs, Weight trends, Skin, I & O's  REASON FOR ASSESSMENT:   Malnutrition Screening Tool    ASSESSMENT:   79 y.o male with prostate cancer with bone mets on zytiga and androgen deprivation therapy with monthly xgeva presented to the ED due to blood in the stool, he reported four episodes diarrhea with bright color stool and one episode while in the ED, he denies ab pain, no n/v, no fever, last normal bm was 3-4 days ago, reported has been taking motrin bid for the last 59months due to bone pain.   Patient in room with family at bedside. Pt is currently NPO and awaiting a GI consult later today. Pt reports poor appetite PTA and was prescribed Megace to take earlier this month. Pt states he noticed a tiny difference in appetite but not much. He does not eat breakfast most days and mainly consumes lunch and dinner meals. He states he was drinking plenty of fluids. He has noticed muscle weakness lately.   Patient's family with questions regarding protein shakes. RD answered all questions and will follow-up with patient once diet is advanced for further needs.  Per weight history, pt has lost 12 lb since 10/21 (6% weight loss x 1 month, significant for time frame).  Nutrition-Focused physical exam completed. Findings are severe fat depletion, mild-severe muscle depletion, and no edema.    Labs reviewed: CBGs: 161-197 Elevated BUN  Diet Order:  Diet NPO time specified  Skin:  Reviewed, no issues  Last BM:  11/16  Height:   Ht Readings from Last 1 Encounters:  05/27/15 6' (1.829 m)    Weight:   Wt Readings from Last 1 Encounters:  05/27/15 199 lb 15.3 oz (90.7 kg)    Ideal Body Weight:  80.9 kg  BMI:  Body mass index is 27.11 kg/(m^2).  Estimated Nutritional Needs:   Kcal:  2200-2400  Protein:  125-135g  Fluid:  2.3L/day  EDUCATION NEEDS:   No education needs identified at this time  Clayton Bibles, MS, RD, LDN Pager: [phone] After Hours Pager: [phone]

## 2015-05-29 ENCOUNTER — Encounter: Payer: Self-pay | Admitting: Cardiology

## 2015-05-29 LAB — BASIC METABOLIC PANEL
Anion gap: 9 (ref 5–15)
BUN: 18 mg/dL (ref 6–20)
CALCIUM: 6.7 mg/dL — AB (ref 8.9–10.3)
CO2: 19 mmol/L — ABNORMAL LOW (ref 22–32)
CREATININE: 0.88 mg/dL (ref 0.61–1.24)
Chloride: 112 mmol/L — ABNORMAL HIGH (ref 101–111)
GFR calc Af Amer: >60 mL/min (ref >60)
Glucose, Bld: 191 mg/dL — ABNORMAL HIGH (ref 65–99)
POTASSIUM: 3.6 mmol/L (ref 3.5–5.1)
SODIUM: 140 mmol/L (ref 135–145)

## 2015-05-29 LAB — CBC
HCT: 22.1 % — ABNORMAL LOW (ref 39.0–52.0)
Hemoglobin: 7.2 g/dL — ABNORMAL LOW (ref 13.0–17.0)
MCH: 28.9 pg (ref 26.0–34.0)
MCHC: 32.6 g/dL (ref 30.0–36.0)
MCV: 88.8 fL (ref 78.0–100.0)
PLATELETS: 386 10*3/uL (ref 150–400)
RBC: 2.49 MIL/uL — AB (ref 4.22–5.81)
RDW: 16.8 % — ABNORMAL HIGH (ref 11.5–15.5)
WBC: 10.4 10*3/uL (ref 4.0–10.5)

## 2015-05-29 LAB — GLUCOSE, CAPILLARY
GLUCOSE-CAPILLARY: 179 mg/dL — AB (ref 65–99)
GLUCOSE-CAPILLARY: 155 mg/dL — AB (ref 65–99)
GLUCOSE-CAPILLARY: 159 mg/dL — AB (ref 65–99)
Glucose-Capillary: 225 mg/dL — ABNORMAL HIGH (ref 65–99)
Glucose-Capillary: 183 mg/dL — ABNORMAL HIGH (ref 65–99)

## 2015-05-29 LAB — HEMOGLOBIN A1C
HEMOGLOBIN A1C: 6.3 % — AB (ref 4.8–5.6)
Mean Plasma Glucose: 134 mg/dL

## 2015-05-29 LAB — MAGNESIUM: MAGNESIUM: 1.8 mg/dL (ref 1.7–2.4)

## 2015-05-29 LAB — PREPARE RBC (CROSSMATCH)

## 2015-05-29 MED ORDER — INSULIN DETEMIR 100 UNIT/ML ~~LOC~~ SOLN
12.0000 [IU] | Freq: Every day | SUBCUTANEOUS | Status: DC
  Administered 2015-05-29: 12 [IU] via SUBCUTANEOUS
  Filled 2015-05-29: qty 0.12

## 2015-05-29 MED ORDER — SODIUM CHLORIDE 0.9 % IV SOLN
Freq: Once | INTRAVENOUS | Status: AC
  Administered 2015-05-29: 14:00:00 via INTRAVENOUS

## 2015-05-29 NOTE — Progress Notes (Signed)
Jeremy Jennings 9:20 AM  Subjective: I know patient from taking care of his wife years ago and he is doing fine without signs of bleeding and no problems from his endoscopy and he believes he had a colonoscopy over 10 years ago and has not had any other bleeding episodes and his hospital computer chart was reviewed in his case discussed with my partner Dr. Amedeo Plenty  Objective: Vital signs stable afebrile no acute distress abdomen is soft nontender hemoglobin slight drop BUN decreased as well  Assessment: GI bleeding questionably etiology in patient on aspirin and nonsteroidals  Plan: I offered him a colonoscopy tomorrow but he prefers prefers to do it as an outpatient and I think that is fine and hopefully oncology and the hospitalists agree and I gave him my card and my nurses name and we can get it done the week after Thanksgiving and he understands he will need to come in and get the prep and get a ride for the procedure and will call me sooner when necessary recurrent bleeding and please call us if any other question or  You  disagree with the plan and would probably transfuse him a unit or 2 more before discharge and cautioned him about no more aspirin and nonsteroidals and use Tylenol only for give other pain medicines  [facility]  Pager [phone] After 5PM or if no answer call [phone]

## 2015-05-29 NOTE — Progress Notes (Signed)
PROGRESS NOTE  Jeremy Jennings [id number] DOB: [birth year] DOA: 05/27/2015 PCP: [name], MD  HPI/Recap of past 24 hours:  hgb this am is 7.2, dropped from 8.3 yesterday, No more bloody stool since admitted, denies ab pain, no n/v, tachycardia has improved (reported baseline tachycardia)  Assessment/Plan: Active Problems:   Acute GI bleeding   GI bleed  GI bleed, no abdominal pain, source unclear, could be from upper GI due to NSAIDs use, could be from hemorrhoids/ diverticula. No pain, no n/v. Continue iv ppi bid,    EGD done on 11/17 by Dr. Amedeo Plenty from [facility], mild distal esophagitis with small hiatal hernia. Will f/u GI recommendation regarding sigmoidosocopy/colonoscopy and diet advancement.  Acute blood loss anemia on chronic anemia/Symptomatic anemia:  with sinus tachycardia 120-130's on presentation, prbcx2units  On 11/16 ( last blood transfusion was on 11/7) hgb 8.3 post transfusion, this am dropped to 7.2, no reported of bleed, GI to decide sigmoidosocopy/colonoscopy. 11/18: Transfuse prbcx2 per gi recommendation.  Htn: continue home bp meds  Insulin dependent diabetes:  a1c 6.3 , start ssi, restart home levemir since start to eat now.  Metastatic prostate ca to bone: continue daily zytiga, defer to outpatient oncology and urology.   DVT prophylaxis: scd's  Consultants: [facility]  Code Status: DNR, confirmed with the patient  Family Communication: Patient and daughter  Disposition Plan: likely home in 1-2 days if no more bleed,     Procedures:  EGD 3pm on 11/17  Antibiotics:  none   Objective: BP 154/60 mmHg  Pulse 100  Temp(Src) 98.4 F (36.9 C) (Oral)  Resp 22  Ht 6' (1.829 m)  Wt 210 lb 1.6 oz (95.3 kg)  BMI 28.49 kg/m2  SpO2 100%  Intake/Output Summary (Last 24 hours) at 05/29/15 0824 Last data filed at 05/29/15 0700  Gross per 24 hour  Intake   1925 ml  Output   1375 ml  Net    550 ml   Filed Weights   05/27/15 1826  05/29/15 0400  Weight: 199 lb 15.3 oz (90.7 kg) 210 lb 1.6 oz (95.3 kg)    Exam:   General:  NAD  Cardiovascular: sinus tachycardia, but much improved  Respiratory: CTABL  Abdomen: Soft/ND/NT, positive BS  Musculoskeletal: No Edema  Neuro: aaox3  Data Reviewed: Basic Metabolic Panel:  Recent Labs Lab 05/27/15 1556 05/28/15 0350 05/29/15 0345  NA 139 139 140  K 3.8 3.6 3.6  CL 110 109 112*  CO2 22 21* 19*  GLUCOSE 230* 216* 191*  BUN 25* 21* 18  CREATININE 0.92 0.79 0.88  CALCIUM 8.0* 7.4* 6.7*  MG  --   --  1.8   Liver Function Tests:  Recent Labs Lab 05/27/15 1556 05/28/15 0350  AST 14* 14*  ALT 10* 9*  ALKPHOS 500* 403*  BILITOT 0.5 0.9  PROT 6.1* 5.5*  ALBUMIN 2.3* 2.1*   No results for input(s): LIPASE, AMYLASE in the last 168 hours. No results for input(s): AMMONIA in the last 168 hours. CBC:  Recent Labs Lab 05/27/15 1556 05/28/15 0350 05/29/15 0345  WBC 8.2 10.0 10.4  HGB 7.1* 8.3* 7.2*  HCT 22.4* 25.0* 22.1*  MCV 86.8 87.4 88.8  PLT 441* 387 386   Cardiac Enzymes:   No results for input(s): CKTOTAL, CKMB, CKMBINDEX, TROPONINI in the last 168 hours. BNP (last 3 results) No results for input(s): BNP in the last 8760 hours.  ProBNP (last 3 results) No results for input(s): PROBNP in the last  8760 hours.  CBG:  Recent Labs Lab 05/27/15 2200 05/28/15 0810 05/28/15 1234 05/28/15 1557 05/28/15 2254  GLUCAP 161* 197* 162* 150* 180*    Recent Results (from the past 240 hour(s))  MRSA PCR Screening     Status: None   Collection Time: 05/27/15  6:20 PM  Result Value Ref Range Status   MRSA by PCR NEGATIVE NEGATIVE Final    Comment:        The GeneXpert MRSA Assay (FDA approved for NASAL specimens only), is one component of a comprehensive MRSA colonization surveillance program. It is not intended to diagnose MRSA infection nor to guide or monitor treatment for MRSA infections.      Studies: No results  found.  Scheduled Meds: . sodium chloride   Intravenous Once  . abiraterone Acetate  1,000 mg Oral QHS  . atorvastatin  10 mg Oral Daily  . brimonidine  1 drop Right Eye Daily   And  . timolol  1 drop Right Eye Daily  . carvedilol  12.5 mg Oral BID WC  . diltiazem  360 mg Oral Daily  . insulin aspart  0-15 Units Subcutaneous TID WC  . Insulin Detemir  12 Units Subcutaneous Q2200  . pantoprazole (PROTONIX) IV  40 mg Intravenous Q12H  . prednisoLONE acetate  1 drop Both Eyes BID  . tamsulosin  0.4 mg Oral Daily    Continuous Infusions:    Time spent: 93mins  Autumne Kallio MD, PhD  [facility] Pager [phone]. If 7PM-7AM, please contact night-coverage at [email or web address], password [facility] 05/29/2015, 8:24 AM  LOS: 2 days

## 2015-05-29 NOTE — Care Management Important Message (Signed)
Important Message  Patient Details  Name: Jeremy Jennings MRN: [id number] Date of Birth: 1936-07-03   Medicare Important Message Given:  Yes    Shelda Altes 05/29/2015, 1:02 [place] Message  Patient Details  Name: Jeremy Jennings MRN: [id number] Date of Birth: [birth year]   Medicare Important Message Given:  Yes    Shelda Altes 05/29/2015, 1:02 PM

## 2015-05-30 DIAGNOSIS — R627 Adult failure to thrive: Secondary | ICD-10-CM

## 2015-05-30 LAB — TYPE AND SCREEN
ABO/RH(D): B POS
ANTIBODY SCREEN: NEGATIVE
UNIT DIVISION: 0
UNIT DIVISION: 0
Unit division: 0
Unit division: 0

## 2015-05-30 LAB — BASIC METABOLIC PANEL
ANION GAP: 9 (ref 5–15)
BUN: 28 mg/dL — ABNORMAL HIGH (ref 6–20)
CALCIUM: 6.7 mg/dL — AB (ref 8.9–10.3)
CO2: 19 mmol/L — ABNORMAL LOW (ref 22–32)
CREATININE: 1.12 mg/dL (ref 0.61–1.24)
Chloride: 111 mmol/L (ref 101–111)
Glucose, Bld: 147 mg/dL — ABNORMAL HIGH (ref 65–99)
Potassium: 3.5 mmol/L (ref 3.5–5.1)
SODIUM: 139 mmol/L (ref 135–145)

## 2015-05-30 LAB — CBC
HEMATOCRIT: 26.3 % — AB (ref 39.0–52.0)
Hemoglobin: 8.9 g/dL — ABNORMAL LOW (ref 13.0–17.0)
MCH: 29.7 pg (ref 26.0–34.0)
MCHC: 33.8 g/dL (ref 30.0–36.0)
MCV: 87.7 fL (ref 78.0–100.0)
PLATELETS: 396 10*3/uL (ref 150–400)
RBC: 3 MIL/uL — ABNORMAL LOW (ref 4.22–5.81)
RDW: 16.5 % — AB (ref 11.5–15.5)
WBC: 9.9 10*3/uL (ref 4.0–10.5)

## 2015-05-30 LAB — MAGNESIUM: MAGNESIUM: 2 mg/dL (ref 1.7–2.4)

## 2015-05-30 LAB — GLUCOSE, CAPILLARY
GLUCOSE-CAPILLARY: 183 mg/dL — AB (ref 65–99)
GLUCOSE-CAPILLARY: 195 mg/dL — AB (ref 65–99)

## 2015-05-30 MED ORDER — OXYCODONE-ACETAMINOPHEN 5-325 MG PO TABS: 1.0000 | ORAL_TABLET | ORAL | Status: DC | PRN

## 2015-05-30 MED ORDER — ACETAMINOPHEN ER 650 MG PO TBCR: 650.0000 mg | EXTENDED_RELEASE_TABLET | Freq: Three times a day (TID) | ORAL | Status: DC | PRN

## 2015-05-30 NOTE — Evaluation (Signed)
Physical Therapy Evaluation Patient Details Name: Jeremy Jennings MRN: [id number] DOB: 05/08/1936 Today's Date: 05/30/2015   History of Present Illness  79 yo male admitte with acute GI bleed. Hx of prostate cancer, DM, HTN.   Clinical Impression  On eval, pt required Min guard-Min assist for mobility-walked ~165 feet. Intermittent LOB x 2 while ambulating. Instructed pt to consider cane use to provide increased stability. Also instructed pt to slowly return to activity. Daughter present during session-states she will stay with pt through Thanksgiving. Recommend HHPT and 24 hour supervision. May need to consider home health aide as well especially since daughter can only assist for a short period of time.     Follow Up Recommendations Home health PT;Supervision/Assistance - 24 hour    Equipment Recommendations  Kasandra Knudsen (pt states he will pick up his own)    Recommendations for Other Services       Precautions / Restrictions Precautions Precautions: Fall Restrictions Weight Bearing Restrictions: No      Mobility  Bed Mobility               General bed mobility comments: pt up walking at start of session  Transfers Overall transfer level: Needs assistance   Transfers: Sit to/from Stand Sit to Stand: Supervision         General transfer comment: for safety  Ambulation/Gait Ambulation/Gait assistance: Min assist Ambulation Distance (Feet): 165 Feet Assistive device: None Gait Pattern/deviations: Step-through pattern;Staggering right;Drifts right/left;Staggering left     General Gait Details: Intermittent LOB x 2 requiring external assist or environment(wall or rail) to prevent fall. Dyspnea 2-3/4 with ambulation.   Stairs            Wheelchair Mobility    Modified Rankin (Stroke Patients Only)       Balance Overall balance assessment: Needs assistance         Standing balance support: During functional activity Standing balance-Leahy Scale:  Fair                               Pertinent Vitals/Pain Pain Assessment: No/denies pain    Home Living Family/patient expects to be discharged to:: Private residence Living Arrangements: Alone Available Help at Discharge: Family (daughter to stay with pt until Thanksgiving Day) Type of Home: House Home Access: Stairs to enter   Technical brewer of Steps: 2 Home Layout: One level Home Equipment: Crutches      Prior Function Level of Independence: Independent               Hand Dominance        Extremity/Trunk Assessment   Upper Extremity Assessment: Generalized weakness           Lower Extremity Assessment: Generalized weakness      Cervical / Trunk Assessment: Normal  Communication   Communication: No difficulties  Cognition Arousal/Alertness: Awake/alert Behavior During Therapy: WFL for tasks assessed/performed Overall Cognitive Status: Within Functional Limits for tasks assessed                      General Comments      Exercises        Assessment/Plan    PT Assessment Patient needs continued PT services  PT Diagnosis Difficulty walking;Generalized weakness   PT Problem List Decreased strength;Decreased activity tolerance;Decreased balance;Decreased mobility;Decreased knowledge of use of DME  PT Treatment Interventions DME instruction;Gait training;Functional mobility training;Therapeutic activities;Patient/family education;Balance training;Therapeutic exercise   PT  Goals (Current goals can be found in the Care Plan section) Acute Rehab PT Goals Patient Stated Goal: home soon PT Goal Formulation: With patient/family Time For Goal Achievement: 06/13/15 Potential to Achieve Goals: Good    Frequency Min 3X/week   Barriers to discharge        Co-evaluation               End of Session Equipment Utilized During Treatment: Gait belt Activity Tolerance: Patient limited by fatigue Patient left: in  bed;with call bell/phone within reach;with family/visitor present (sitting EOB)           Time: [id number] PT Time Calculation (min) (ACUTE ONLY): 8 min   Charges:   PT Evaluation $Initial PT Evaluation Tier I: 1 Procedure     PT G Codes:        Weston Anna, MPT Pager: [phone]

## 2015-05-30 NOTE — Discharge Summary (Addendum)
Discharge Summary  Jeremy Jennings:[id number] DOB: Jun 05, 1936  PCP: [name], MD  Admit date: 05/27/2015 Discharge date: 05/30/2015  Time spent: >75mns  Recommendations for Outpatient Follow-up:  1. F/u with PMD Dr. KDwyane Deewithin a week, repeat cbc/bmp at follow up. Transfuse prn if hgb drop less than 8. 2. F/u with [facility] in two week, patient declined inpatient colonoscopy, agreed to outpatient colonoscopy.  Discharge Diagnoses:  Active Hospital Problems   Diagnosis Date Noted  . Acute GI bleeding 05/27/2015  . GI bleed 05/27/2015    Resolved Hospital Problems   Diagnosis Date Noted Date Resolved  No resolved problems to display.    Discharge Condition: stable  Diet recommendation: heart healthy/carb modified, soft diet for a week, advance diet as tolerated.  Filed Weights   05/27/15 1826 05/29/15 0400  Weight: 199 lb 15.3 oz (90.7 kg) 210 lb 1.6 oz (95.3 kg)    History of present illness:  Jeremy KNUEPPELis a 79y.o. male  With prostate cancer with bone mets on zytiga and androgen deprivation therapy with monthly xgeva presented to the ED due to blood in the stool, he reported four episodes diarrhea with bright color stool and one episode while in the ED, he denies ab pain, no n/v, no fever, last normal bm was 3-4 days ago, reported has been taking motrin bid for the last 640monthdue to bone pain. He received blood transfusion a week ago for anemia, today, hgb 7.1 , he was tachycardia upon arrival to the ED, EDP ordered two unit of prbc, GI consulted by EDP, recommended npo after midnight and possible scope in am, hospitalist called to admit the patient.  Patient denies fever,no sob, no chest pain, reported baseline activity is limited due to pain, does still drive.   Hospital Course:  Active Problems:   Acute GI bleeding   GI bleed  GI bleed, no abdominal pain, source unclear, could be from upper GI due to NSAIDs use, could be from hemorrhoids/  diverticula. No pain, no n/v. received iv ppi bid,  EGD done on 11/17 by Dr. HaAmedeo Plentyrom [place], mild distal esophagitis with small hiatal hernia. Patient declined inpatient colonoscopy, will f/u with [facility] in two weeks for outpatient colonoscopy. No more gi bleed observed in the hospital, Patient is advised to avoid NSAIDS, patient and daughter expressed understanding.  Acute blood loss anemia on chronic anemia/ likely from chronic gi blood loss/Symptomatic anemia:  with sinus tachycardia 120-130's on presentation, prbcx2units On 11/16 ( last blood transfusion was on 11/7) hgb 8.3 post transfusion, this am dropped to 7.2, s/p EGD, patient declined inpatient colonoscopy. 11/18: Transfuse prbcx2 per gi recommendation. Tachycardia has resolved. hgb stable.  HTN: continue home bp meds, bp stable. (patient has stopped home meds tekturna has stopped PTA, did not resume at discharge)  Insulin dependent diabetes: a1c 6.3 ,  Continue home regimen. F/u with Dr. KuDwyane Deeor diabetic management.  Metastatic prostate ca to bone: continue daily zytiga, and hormonal therapy, defer to outpatient oncology and urology.  FTT/ weakness: home health arranged.  Severe malnutrition: nutrition supplement    Consultants: [facility]  Code Status: DNR, confirmed with the patient  Family Communication: Patient and daughter  Disposition Plan: home with home health on 11/19    Procedures:  EGD 3pm on 11/17  Antibiotics:  none  Discharge Exam: BP 119/55 mmHg  Pulse 81  Temp(Src) 98.1 F (36.7 C) (Oral)  Resp 16  Ht 6' (1.829 m)  Wt 210 lb 1.6 oz (95.3 kg)  BMI 28.49 kg/m2  SpO2 99%   General: NAD  Cardiovascular: sinus tachycardia has resolved.  Respiratory: CTABL  Abdomen: Soft/ND/NT, positive BS  Musculoskeletal: No Edema  Neuro: aaox3   Discharge Instructions You were cared for by a hospitalist during your hospital stay. If you have any questions about your discharge  medications or the care you received while you were in the hospital after you are discharged, you can call the unit and asked to speak with the hospitalist on call if the hospitalist that took care of you is not available. Once you are discharged, your primary care physician will handle any further medical issues. Please note that NO REFILLS for any discharge medications will be authorized once you are discharged, as it is imperative that you return to your primary care physician (or establish a relationship with a primary care physician if you do not have one) for your aftercare needs so that they can reassess your need for medications and monitor your lab values.      Discharge Instructions    Diet - low sodium heart healthy    Complete by:  As directed   Carb modified, soft diet for the first week, advance diet as tolerated.     Face-to-face encounter (required for Medicare/Medicaid patients)    Complete by:  As directed   I Natanel Snavely certify that this patient is under my care and that I, or a nurse practitioner or physician's assistant working with me, had a face-to-face encounter that meets the physician face-to-face encounter requirements with this patient on 05/30/2015. The encounter with the patient was in whole, or in part for the following medical condition(s) which is the primary reason for home health care (List medical condition): FTT  The encounter with the patient was in whole, or in part, for the following medical condition, which is the primary reason for home health care:  FTT/weakness  I certify that, based on my findings, the following services are medically necessary home health services:   Physical therapy Nursing    Reason for Medically Necessary Home Health Services:  Skilled Nursing- Change/Decline in Patient Status  My clinical findings support the need for the above services:  Pain interferes with ambulation/mobility  Further, I certify that my clinical findings support that  this patient is homebound due to:  Pain interferes with ambulation/mobility     Home Health    Complete by:  As directed   To provide the following care/treatments:   PT RN       Increase activity slowly    Complete by:  As directed             Medication List    STOP taking these medications        aliskiren 150 MG tablet  Commonly known as:  TEKTURNA     ibuprofen 200 MG tablet  Commonly known as:  ADVIL,MOTRIN      TAKE these medications        abiraterone Acetate 250 MG tablet  Commonly known as:  ZYTIGA  Take 4 tablets (1,000 mg total) by mouth daily. Take on an empty stomach 1 hour before or 2 hours after a meal     ACCU-CHEK AVIVA PLUS W/DEVICE Kit  Use to check blood sugar 2 times per day dx code E11.65     ACCU-CHEK SOFTCLIX LANCETS lancets  Use as instructed to check blood sugar 2 times per day dx code E11.65  acetaminophen 650 MG CR tablet  Commonly known as:  TYLENOL  Take 1 tablet (650 mg total) by mouth every 8 (eight) hours as needed for pain.     atorvastatin 10 MG tablet  Commonly known as:  LIPITOR  take 1 tablet by mouth once daily     carvedilol 12.5 MG tablet  Commonly known as:  COREG  Take 1 tablet (12.5 mg total) by mouth 2 (two) times daily with a meal.     COMBIGAN 0.2-0.5 % ophthalmic solution  Generic drug:  brimonidine-timolol  Place 1 drop into the right eye daily.     diltiazem 360 MG 24 hr capsule  Commonly known as:  TIAZAC  Take 360 mg by mouth daily.     docusate sodium 100 MG capsule  Commonly known as:  COLACE  Take 100 mg by mouth daily.     glucose blood test strip  Commonly known as:  ACCU-CHEK AVIVA PLUS  Use as instructed to check blood sugar 2 times per day dx code E11.65     guaiFENesin 100 MG/5ML liquid  Commonly known as:  ROBITUSSIN  Take 200 mg by mouth 3 (three) times daily as needed for cough.     insulin aspart 100 UNIT/ML FlexPen  Commonly known as:  NOVOLOG FLEXPEN  Inject 12 units 3 times  per day with meals     Insulin Detemir 100 UNIT/ML Pen  Commonly known as:  LEVEMIR  Inject 12 Units into the skin daily at 10 pm.     Insulin Pen Needle 31G X 5 MM Misc  Commonly known as:  B-D UF III MINI PEN NEEDLES  1 each by Does not apply route 2 (two) times daily.     Liraglutide 18 MG/3ML Sopn  Inject 1.8 mg into the skin daily.     megestrol 400 MG/10ML suspension  Commonly known as:  MEGACE  Take 10 mLs (400 mg total) by mouth 2 (two) times daily.     oxyCODONE-acetaminophen 5-325 MG tablet  Commonly known as:  PERCOCET/ROXICET  Take 1 tablet by mouth every 4 (four) hours as needed for moderate pain or severe pain.     pioglitazone 30 MG tablet  Commonly known as:  ACTOS  Take 1 tablet (30 mg total) by mouth daily.     prednisoLONE acetate 1 % ophthalmic suspension  Commonly known as:  PRED FORTE  Place 1 drop into both eyes 2 (two) times daily.     tamsulosin 0.4 MG Caps capsule  Commonly known as:  FLOMAX  Take 0.4 mg by mouth daily.     VITAMIN B-12 PO  Take 1 tablet by mouth daily.     VITAMIN D PO  Take 5,000 Units by mouth daily. Taking 5000 daily       No Known Allergies Follow-up Information    Follow up with [facility], MD In 1 week.   Specialty:  Endocrinology   Why:  hospital discharge follow up, repeat cbc /bmp at follow up   Contact information:   [place] [address] [phone]       Follow up with [facility], MD In 2 weeks.   Specialty:  Gastroenterology   Why:  gi bleed, colonoscopy   Contact information:   [address] [address] [phone]       Follow up with [facility].   Why:  home health physical therapy   Contact information:  [address] [place] [address] [phone]        The results of significant diagnostics from this hospitalization (including imaging, microbiology, ancillary and laboratory) are listed below for  reference.    Significant Diagnostic Studies: No results found.  Microbiology: Recent Results (from the past 240 hour(s))  MRSA PCR Screening     Status: None   Collection Time: 05/27/15  6:20 PM  Result Value Ref Range Status   MRSA by PCR NEGATIVE NEGATIVE Final    Comment:        The GeneXpert MRSA Assay (FDA approved for NASAL specimens only), is one component of a comprehensive MRSA colonization surveillance program. It is not intended to diagnose MRSA infection nor to guide or monitor treatment for MRSA infections.      Labs: Basic Metabolic Panel:  Recent Labs Lab 05/27/15 1556 05/28/15 0350 05/29/15 0345 05/30/15 0525  NA 139 139 140 139  K 3.8 3.6 3.6 3.5  CL 110 109 112* 111  CO2 22 21* 19* 19*  GLUCOSE 230* 216* 191* 147*  BUN 25* 21* 18 28*  CREATININE 0.92 0.79 0.88 1.12  CALCIUM 8.0* 7.4* 6.7* 6.7*  MG  --   --  1.8 2.0   Liver Function Tests:  Recent Labs Lab 05/27/15 1556 05/28/15 0350  AST 14* 14*  ALT 10* 9*  ALKPHOS 500* 403*  BILITOT 0.5 0.9  PROT 6.1* 5.5*  ALBUMIN 2.3* 2.1*   No results for input(s): LIPASE, AMYLASE in the last 168 hours. No results for input(s): AMMONIA in the last 168 hours. CBC:  Recent Labs Lab 05/27/15 1556 05/28/15 0350 05/29/15 0345 05/30/15 0525  WBC 8.2 10.0 10.4 9.9  HGB 7.1* 8.3* 7.2* 8.9*  HCT 22.4* 25.0* 22.1* 26.3*  MCV 86.8 87.4 88.8 87.7  PLT 441* 387 386 396   Cardiac Enzymes: No results for input(s): CKTOTAL, CKMB, CKMBINDEX, TROPONINI in the last 168 hours. BNP: BNP (last 3 results) No results for input(s): BNP in the last 8760 hours.  ProBNP (last 3 results) No results for input(s): PROBNP in the last 8760 hours.  CBG:  Recent Labs Lab 05/29/15 1651 05/29/15 2059 05/29/15 2253 05/30/15 0731 05/30/15 1126  GLUCAP 183* 155* 159* 183* 195*       Signed:  Caster Fayette MD, PhD  [facility] 05/30/2015, 11:32 AM

## 2015-05-30 NOTE — Progress Notes (Addendum)
Shift progressed uneventfully. Initial neurologic assessment was remarkable for relative strength difference between the upper and lower extremities, UE > LE. Upper extremity motor strength noted to be full and symmetric, graded as 5/5. Patient reported marked comparative strength reduction in both lower extremities, graded as 3/5. Patient is capable of voluntary lower extremity movements not sufficient to overcome gravity, resulting in significant decline of ambulation capacity from baseline. Patient had a singular pain episode this past shift, identifying the right knee, shin and foot as the focal points of tenderness. Patient was medicated with PRN po Oxycodone at that time, which resulted in relief of pain. The remainder of the shift was unremarkable.  Patient received the second of two units of packed red blood cells last night, without ensuing complications. Patient showed no signs or symptoms of transfusion reaction, denying onset of SOB, fever, chills, hematuria, back or flank pain, or cutaneous eruptions such as hives or rashes. Vital signs remained stable. Post transfusion CBC drawn at 05:25 this AM revealed comparative increase of Hemoglobin and Hematocrit levels (to 8.9 g/dL and 26%, respectively) over pre-transfusion values.

## 2015-05-30 NOTE — Care Management Note (Signed)
Case Management Note  Patient Details  Name: Jeremy Jennings MRN: [id number] Date of Birth: [birth year]  Subjective/Objective:                  GI bleed  Action/Plan: CM spoke with patient at the bedside and provided a list of home health providers. Patient selected [facility] for HHPT. Tiffany at [facility] notified of the referral.   Expected Discharge Date:  05/30/15               Expected Discharge Plan:  [name]  In-House Referral:  NA  Discharge planning Services  CM Consult  Post Acute Care Choice:  Home Health Choice offered to:  Patient  DME Arranged:  N/A DME Agency:     HH Arranged:  PT [place] Agency:  [facility]  Status of Service:  Completed, signed off  Medicare Important Message Given:  Yes Date Medicare IM Given:    Medicare IM give by:    Date Additional Medicare IM Given:    Additional Medicare Important Message give by:     If discussed at [place] of Stay Meetings, dates discussed:    Additional Comments:  Apolonio Schneiders, RN 05/30/2015, 11:29 AM

## 2015-06-01 ENCOUNTER — Encounter (HOSPITAL_COMMUNITY): Payer: Self-pay | Admitting: Gastroenterology

## 2015-06-09 ENCOUNTER — Telehealth: Payer: Self-pay | Admitting: Cardiology

## 2015-06-09 NOTE — Telephone Encounter (Signed)
Dr. Mare Ferrari had ordered nursing care for pt, they have been unable to make contact with the patient. The case has been closed and pt needs to contact them to re open if he is interested.

## 2015-06-10 NOTE — Telephone Encounter (Signed)
Okay, thanks

## 2015-06-10 NOTE — Telephone Encounter (Signed)
Will forward to  Dr. Brackbill for review 

## 2015-06-11 ENCOUNTER — Other Ambulatory Visit: Payer: Self-pay

## 2015-06-11 ENCOUNTER — Emergency Department (HOSPITAL_COMMUNITY): Payer: Medicare Other

## 2015-06-11 ENCOUNTER — Inpatient Hospital Stay (HOSPITAL_COMMUNITY): Payer: Medicare Other

## 2015-06-11 ENCOUNTER — Inpatient Hospital Stay (HOSPITAL_COMMUNITY)
Admission: EM | Admit: 2015-06-11 | Discharge: 2015-07-03 | DRG: 329 | Disposition: A | Discharge status: Skilled Nursing Facility | Payer: Medicare Other | Attending: General Surgery | Admitting: General Surgery

## 2015-06-11 ENCOUNTER — Ambulatory Visit (HOSPITAL_COMMUNITY)
Admission: RE | Admit: 2015-06-11 | Discharge: 2015-06-11 | Disposition: A | Discharge status: Home / Self Care | Payer: Medicare Other | Source: Ambulatory Visit | Attending: Oncology | Admitting: Oncology

## 2015-06-11 ENCOUNTER — Encounter (HOSPITAL_COMMUNITY): Payer: Self-pay

## 2015-06-11 DIAGNOSIS — Z79899 Other long term (current) drug therapy: Secondary | ICD-10-CM

## 2015-06-11 DIAGNOSIS — K429 Umbilical hernia without obstruction or gangrene: Secondary | ICD-10-CM | POA: Diagnosis present

## 2015-06-11 DIAGNOSIS — I491 Atrial premature depolarization: Secondary | ICD-10-CM | POA: Diagnosis present

## 2015-06-11 DIAGNOSIS — C61 Malignant neoplasm of prostate: Secondary | ICD-10-CM | POA: Diagnosis present

## 2015-06-11 DIAGNOSIS — K460 Unspecified abdominal hernia with obstruction, without gangrene: Secondary | ICD-10-CM | POA: Diagnosis not present

## 2015-06-11 DIAGNOSIS — K565 Intestinal adhesions [bands] with obstruction (postprocedural) (postinfection): Secondary | ICD-10-CM | POA: Diagnosis present

## 2015-06-11 DIAGNOSIS — I10 Essential (primary) hypertension: Secondary | ICD-10-CM | POA: Diagnosis present

## 2015-06-11 DIAGNOSIS — I5033 Acute on chronic diastolic (congestive) heart failure: Secondary | ICD-10-CM | POA: Diagnosis present

## 2015-06-11 DIAGNOSIS — R0600 Dyspnea, unspecified: Secondary | ICD-10-CM | POA: Diagnosis not present

## 2015-06-11 DIAGNOSIS — I493 Ventricular premature depolarization: Secondary | ICD-10-CM | POA: Diagnosis present

## 2015-06-11 DIAGNOSIS — J441 Chronic obstructive pulmonary disease with (acute) exacerbation: Secondary | ICD-10-CM | POA: Diagnosis present

## 2015-06-11 DIAGNOSIS — J189 Pneumonia, unspecified organism: Secondary | ICD-10-CM | POA: Diagnosis not present

## 2015-06-11 DIAGNOSIS — E870 Hyperosmolality and hypernatremia: Secondary | ICD-10-CM | POA: Diagnosis present

## 2015-06-11 DIAGNOSIS — M1712 Unilateral primary osteoarthritis, left knee: Secondary | ICD-10-CM | POA: Diagnosis present

## 2015-06-11 DIAGNOSIS — E11649 Type 2 diabetes mellitus with hypoglycemia without coma: Secondary | ICD-10-CM | POA: Diagnosis present

## 2015-06-11 DIAGNOSIS — B952 Enterococcus as the cause of diseases classified elsewhere: Secondary | ICD-10-CM | POA: Diagnosis not present

## 2015-06-11 DIAGNOSIS — J9600 Acute respiratory failure, unspecified whether with hypoxia or hypercapnia: Secondary | ICD-10-CM

## 2015-06-11 DIAGNOSIS — D62 Acute posthemorrhagic anemia: Secondary | ICD-10-CM | POA: Diagnosis present

## 2015-06-11 DIAGNOSIS — K567 Ileus, unspecified: Secondary | ICD-10-CM | POA: Diagnosis not present

## 2015-06-11 DIAGNOSIS — I509 Heart failure, unspecified: Secondary | ICD-10-CM | POA: Diagnosis not present

## 2015-06-11 DIAGNOSIS — R609 Edema, unspecified: Secondary | ICD-10-CM

## 2015-06-11 DIAGNOSIS — E876 Hypokalemia: Secondary | ICD-10-CM | POA: Diagnosis not present

## 2015-06-11 DIAGNOSIS — Z87891 Personal history of nicotine dependence: Secondary | ICD-10-CM

## 2015-06-11 DIAGNOSIS — D638 Anemia in other chronic diseases classified elsewhere: Secondary | ICD-10-CM | POA: Diagnosis present

## 2015-06-11 DIAGNOSIS — I5031 Acute diastolic (congestive) heart failure: Secondary | ICD-10-CM | POA: Diagnosis not present

## 2015-06-11 DIAGNOSIS — A419 Sepsis, unspecified organism: Secondary | ICD-10-CM | POA: Diagnosis not present

## 2015-06-11 DIAGNOSIS — I469 Cardiac arrest, cause unspecified: Secondary | ICD-10-CM | POA: Diagnosis not present

## 2015-06-11 DIAGNOSIS — N390 Urinary tract infection, site not specified: Secondary | ICD-10-CM | POA: Diagnosis not present

## 2015-06-11 DIAGNOSIS — Z9889 Other specified postprocedural states: Secondary | ICD-10-CM

## 2015-06-11 DIAGNOSIS — J440 Chronic obstructive pulmonary disease with acute lower respiratory infection: Secondary | ICD-10-CM | POA: Diagnosis present

## 2015-06-11 DIAGNOSIS — J9690 Respiratory failure, unspecified, unspecified whether with hypoxia or hypercapnia: Secondary | ICD-10-CM | POA: Diagnosis not present

## 2015-06-11 DIAGNOSIS — E43 Unspecified severe protein-calorie malnutrition: Secondary | ICD-10-CM | POA: Diagnosis present

## 2015-06-11 DIAGNOSIS — R000 Tachycardia, unspecified: Secondary | ICD-10-CM | POA: Diagnosis present

## 2015-06-11 DIAGNOSIS — E872 Acidosis: Secondary | ICD-10-CM | POA: Diagnosis not present

## 2015-06-11 DIAGNOSIS — J9601 Acute respiratory failure with hypoxia: Secondary | ICD-10-CM | POA: Diagnosis not present

## 2015-06-11 DIAGNOSIS — T17990A Other foreign object in respiratory tract, part unspecified in causing asphyxiation, initial encounter: Secondary | ICD-10-CM | POA: Diagnosis not present

## 2015-06-11 DIAGNOSIS — R509 Fever, unspecified: Secondary | ICD-10-CM

## 2015-06-11 DIAGNOSIS — K9189 Other postprocedural complications and disorders of digestive system: Secondary | ICD-10-CM | POA: Diagnosis not present

## 2015-06-11 DIAGNOSIS — E1165 Type 2 diabetes mellitus with hyperglycemia: Secondary | ICD-10-CM | POA: Diagnosis present

## 2015-06-11 DIAGNOSIS — D649 Anemia, unspecified: Secondary | ICD-10-CM | POA: Diagnosis not present

## 2015-06-11 DIAGNOSIS — C7951 Secondary malignant neoplasm of bone: Secondary | ICD-10-CM | POA: Diagnosis present

## 2015-06-11 DIAGNOSIS — R062 Wheezing: Secondary | ICD-10-CM

## 2015-06-11 DIAGNOSIS — G9340 Encephalopathy, unspecified: Secondary | ICD-10-CM | POA: Diagnosis not present

## 2015-06-11 DIAGNOSIS — K5669 Other intestinal obstruction: Secondary | ICD-10-CM | POA: Diagnosis not present

## 2015-06-11 DIAGNOSIS — Z4659 Encounter for fitting and adjustment of other gastrointestinal appliance and device: Secondary | ICD-10-CM

## 2015-06-11 DIAGNOSIS — R531 Weakness: Secondary | ICD-10-CM | POA: Diagnosis not present

## 2015-06-11 DIAGNOSIS — I110 Hypertensive heart disease with heart failure: Secondary | ICD-10-CM | POA: Diagnosis not present

## 2015-06-11 DIAGNOSIS — T884XXA Failed or difficult intubation, initial encounter: Secondary | ICD-10-CM

## 2015-06-11 DIAGNOSIS — Z452 Encounter for adjustment and management of vascular access device: Secondary | ICD-10-CM

## 2015-06-11 DIAGNOSIS — Y95 Nosocomial condition: Secondary | ICD-10-CM | POA: Diagnosis not present

## 2015-06-11 DIAGNOSIS — J989 Respiratory disorder, unspecified: Secondary | ICD-10-CM | POA: Diagnosis not present

## 2015-06-11 DIAGNOSIS — R0602 Shortness of breath: Secondary | ICD-10-CM

## 2015-06-11 DIAGNOSIS — E785 Hyperlipidemia, unspecified: Secondary | ICD-10-CM | POA: Diagnosis present

## 2015-06-11 DIAGNOSIS — R109 Unspecified abdominal pain: Secondary | ICD-10-CM | POA: Diagnosis present

## 2015-06-11 DIAGNOSIS — M7989 Other specified soft tissue disorders: Secondary | ICD-10-CM | POA: Diagnosis not present

## 2015-06-11 DIAGNOSIS — J9811 Atelectasis: Secondary | ICD-10-CM

## 2015-06-11 DIAGNOSIS — T17910A Gastric contents in respiratory tract, part unspecified causing asphyxiation, initial encounter: Secondary | ICD-10-CM

## 2015-06-11 DIAGNOSIS — K56609 Unspecified intestinal obstruction, unspecified as to partial versus complete obstruction: Secondary | ICD-10-CM | POA: Diagnosis present

## 2015-06-11 HISTORY — DX: Atrial premature depolarization: I49.1

## 2015-06-11 HISTORY — DX: Type 2 diabetes mellitus without complications: E11.9

## 2015-06-11 HISTORY — DX: Personal history of other diseases of the digestive system: Z87.19

## 2015-06-11 HISTORY — DX: Essential (primary) hypertension: I10

## 2015-06-11 LAB — CBC WITH DIFFERENTIAL/PLATELET
BASOS PCT: 0 %
Basophils Absolute: 0 10*3/uL (ref 0.0–0.1)
Eosinophils Absolute: 0.1 10*3/uL (ref 0.0–0.7)
Eosinophils Relative: 1 %
HEMATOCRIT: 27.5 % — AB (ref 39.0–52.0)
HEMOGLOBIN: 9 g/dL — AB (ref 13.0–17.0)
LYMPHS ABS: 1.1 10*3/uL (ref 0.7–4.0)
Lymphocytes Relative: 15 %
MCH: 28.7 pg (ref 26.0–34.0)
MCHC: 32.7 g/dL (ref 30.0–36.0)
MCV: 87.6 fL (ref 78.0–100.0)
MONOS PCT: 9 %
Monocytes Absolute: 0.7 10*3/uL (ref 0.1–1.0)
NEUTROS ABS: 5.9 10*3/uL (ref 1.7–7.7)
NEUTROS PCT: 75 %
Platelets: 393 10*3/uL (ref 150–400)
RBC: 3.14 MIL/uL — AB (ref 4.22–5.81)
RDW: 15.6 % — ABNORMAL HIGH (ref 11.5–15.5)
WBC: 7.8 10*3/uL (ref 4.0–10.5)

## 2015-06-11 LAB — COMPREHENSIVE METABOLIC PANEL
ALBUMIN: 2.5 g/dL — AB (ref 3.5–5.0)
ALK PHOS: 435 U/L — AB (ref 38–126)
ALT: 16 U/L — AB (ref 17–63)
ANION GAP: 8 (ref 5–15)
AST: 21 U/L (ref 15–41)
BILIRUBIN TOTAL: 0.8 mg/dL (ref 0.3–1.2)
BUN: 8 mg/dL (ref 6–20)
CALCIUM: 7 mg/dL — AB (ref 8.9–10.3)
CO2: 23 mmol/L (ref 22–32)
CREATININE: 0.76 mg/dL (ref 0.61–1.24)
Chloride: 105 mmol/L (ref 101–111)
GFR calc Af Amer: >60 mL/min (ref >60)
GFR calc non Af Amer: >60 mL/min (ref >60)
GLUCOSE: 155 mg/dL — AB (ref 65–99)
Potassium: 3 mmol/L — ABNORMAL LOW (ref 3.5–5.1)
Sodium: 136 mmol/L (ref 135–145)
TOTAL PROTEIN: 6.5 g/dL (ref 6.5–8.1)

## 2015-06-11 LAB — PROTIME-INR
INR: 1.15 (ref 0.00–1.49)
Prothrombin Time: 14.9 seconds (ref 11.6–15.2)

## 2015-06-11 LAB — I-STAT CG4 LACTIC ACID, ED
LACTIC ACID, VENOUS: 1.12 mmol/L (ref 0.5–2.0)
LACTIC ACID, VENOUS: 0.9 mmol/L (ref 0.5–2.0)

## 2015-06-11 LAB — GLUCOSE, CAPILLARY
GLUCOSE-CAPILLARY: 146 mg/dL — AB (ref 65–99)
Glucose-Capillary: 135 mg/dL — ABNORMAL HIGH (ref 65–99)

## 2015-06-11 MED ORDER — ONDANSETRON HCL 4 MG/2ML IJ SOLN
4.0000 mg | Freq: Four times a day (QID) | INTRAMUSCULAR | Status: DC | PRN
  Administered 2015-06-17 – 2015-06-27 (×3): 4 mg via INTRAVENOUS
  Filled 2015-06-11 (×3): qty 2

## 2015-06-11 MED ORDER — SODIUM CHLORIDE 0.9 % IV BOLUS (SEPSIS)
1000.0000 mL | Freq: Once | INTRAVENOUS | Status: AC
  Administered 2015-06-11: 1000 mL via INTRAVENOUS

## 2015-06-11 MED ORDER — ACETAMINOPHEN 650 MG RE SUPP
650.0000 mg | Freq: Four times a day (QID) | RECTAL | Status: DC | PRN
  Administered 2015-06-13 – 2015-06-19 (×2): 650 mg via RECTAL
  Filled 2015-06-11 (×2): qty 1

## 2015-06-11 MED ORDER — ONDANSETRON 4 MG PO TBDP
4.0000 mg | ORAL_TABLET | Freq: Four times a day (QID) | ORAL | Status: DC | PRN
  Filled 2015-06-11: qty 1

## 2015-06-11 MED ORDER — POTASSIUM CHLORIDE 10 MEQ/100ML IV SOLN
10.0000 meq | Freq: Once | INTRAVENOUS | Status: AC
  Administered 2015-06-11: 10 meq via INTRAVENOUS
  Filled 2015-06-11: qty 100

## 2015-06-11 MED ORDER — POTASSIUM CHLORIDE IN NACL 20-0.9 MEQ/L-% IV SOLN
INTRAVENOUS | Status: DC
Start: 2015-06-11 — End: 2015-06-13
  Administered 2015-06-11 – 2015-06-13 (×5): via INTRAVENOUS
  Filled 2015-06-11 (×6): qty 1000

## 2015-06-11 MED ORDER — IOHEXOL 300 MG/ML  SOLN
100.0000 mL | Freq: Once | INTRAMUSCULAR | Status: AC | PRN
  Administered 2015-06-11: 100 mL via INTRAVENOUS

## 2015-06-11 MED ORDER — IOHEXOL 300 MG/ML  SOLN: 50.0000 mL | Freq: Once | INTRAMUSCULAR | Status: DC | PRN

## 2015-06-11 MED ORDER — ACETAMINOPHEN 325 MG PO TABS
650.0000 mg | ORAL_TABLET | Freq: Four times a day (QID) | ORAL | Status: DC | PRN
  Administered 2015-06-18 – 2015-06-19 (×2): 650 mg via ORAL
  Filled 2015-06-11 (×2): qty 2

## 2015-06-11 MED ORDER — PANTOPRAZOLE SODIUM 40 MG IV SOLR
40.0000 mg | Freq: Every day | INTRAVENOUS | Status: DC
  Administered 2015-06-11 – 2015-06-25 (×15): 40 mg via INTRAVENOUS
  Filled 2015-06-11 (×15): qty 40

## 2015-06-11 MED ORDER — METOPROLOL TARTRATE 1 MG/ML IV SOLN
2.5000 mg | Freq: Four times a day (QID) | INTRAVENOUS | Status: DC
  Administered 2015-06-11 – 2015-06-15 (×15): 2.5 mg via INTRAVENOUS
  Filled 2015-06-11 (×15): qty 5

## 2015-06-11 MED ORDER — INSULIN ASPART 100 UNIT/ML ~~LOC~~ SOLN
0.0000 [IU] | Freq: Three times a day (TID) | SUBCUTANEOUS | Status: DC
  Administered 2015-06-12: 1 [IU] via SUBCUTANEOUS
  Administered 2015-06-13 – 2015-06-14 (×4): 2 [IU] via SUBCUTANEOUS
  Administered 2015-06-14 – 2015-06-16 (×2): 1 [IU] via SUBCUTANEOUS

## 2015-06-11 MED ORDER — DIPHENHYDRAMINE HCL 50 MG/ML IJ SOLN: 12.5000 mg | Freq: Four times a day (QID) | INTRAMUSCULAR | Status: DC | PRN

## 2015-06-11 MED ORDER — DIATRIZOATE MEGLUMINE & SODIUM 66-10 % PO SOLN
90.0000 mL | Freq: Once | ORAL | Status: DC
  Filled 2015-06-11: qty 90

## 2015-06-11 MED ORDER — DIPHENHYDRAMINE HCL 12.5 MG/5ML PO ELIX: 12.5000 mg | ORAL_SOLUTION | Freq: Four times a day (QID) | ORAL | Status: DC | PRN

## 2015-06-11 MED ORDER — PREDNISOLONE ACETATE 1 % OP SUSP
1.0000 [drp] | Freq: Two times a day (BID) | OPHTHALMIC | Status: DC
  Administered 2015-06-11 – 2015-07-03 (×44): 1 [drp] via OPHTHALMIC
  Filled 2015-06-11: qty 1

## 2015-06-11 MED ORDER — MORPHINE SULFATE (PF) 2 MG/ML IV SOLN
1.0000 mg | INTRAVENOUS | Status: DC | PRN
  Administered 2015-06-11 – 2015-06-12 (×5): 4 mg via INTRAVENOUS
  Filled 2015-06-11 (×5): qty 2

## 2015-06-11 NOTE — ED Notes (Signed)
Pt lives alone.  Drove self here.  States abdominal pain awoke him this am.  Denies n/v/d or fevers.  No BM since this past Sunday.  He had a colonoscopy this past Monday.   Has been getting prostate cancer treatments x 1.5 years.  Pt is diabetic but hasn't eaten or checked CBG yet today but states CBGs have been running fine.

## 2015-06-11 NOTE — ED Notes (Addendum)
Pt can go up at 16:58, Jeremy Jennings

## 2015-06-11 NOTE — H&P (Signed)
Jeremy Jennings 04-Mar-1936  [id number].   Primary Care MD: Dr. Darlin Coco Chief Complaint/Reason for Consult: SBO HPI: This is a very pleasant 79 yo black male who has a history of stage 4 prostate cancer, HTN, DM, tachycardia, recent GI bleed.  He had an endoscopy at that time of admission but apparently deferred an inpatient colonoscopy.  He just had his colonoscopy done by Dr. Watt Climes on Tuesday.  By report, this was normal with no source of bleeding identified.  He denies any further bloody BMs since this admission.  This morning around 6 or 7 am, the patient began having centralized crampy abdominal pain.  He denies nausea or vomiting, but admits to no flatus.  He deals with constipation on a regular basis.  Due to persistent pain, the patient presented to [facility] for further evaluation.  His labs are essentially normal, no elevated WBC.  Hgb is 9.0, lactic acid is normal.  He does have mild tachycardia, which he takes diltiazem for.  He had a CT scan which is suggestive a small bowel obstruction.  It is read as a possible closed loop with surrounding mesenteric fluid which could indicate a perforation.  We have been asked to evaluate the patient for admission.   ROS : Please see HPI, otherwise currently negative, except for arthritic type knee pain.  Family History  Problem Relation Age of Onset  . Cancer Neg Hx   . Kidney failure Brother   . Kidney failure Sister     Past Medical History  Diagnosis Date  . Hypertension   . Diabetes mellitus   . Heart murmur     SYSTOLIC MURMUR AT BASE  . Hyperlipidemia   . Prostate cancer ([place])   . H/O: GI bleed   . Tachycardia     Past Surgical History  Procedure Laterality Date  . Tonsillectomy      IN [facility]  . Hemorroidectomy  1969  . Esophagogastroduodenoscopy N/A 05/28/2015    Procedure: ESOPHAGOGASTRODUODENOSCOPY (EGD);  Surgeon: [name], MD;  Location: [name];  Service: Endoscopy;  Laterality: N/A;  . Hernia repair  AFE 16-17     bilateral inguinal hernias  . Hernia repair  1981    [place]    Social History:  reports that he quit smoking about 35 years ago. His smoking use included Cigarettes. He has a 5 pack-year smoking history. He has never used smokeless tobacco. He reports that he does not drink alcohol or use illicit drugs.  Allergies: No Known Allergies   (Not in a hospital admission)  Blood pressure 161/78, pulse 107, temperature 98.4 F (36.9 C), temperature source Oral, resp. rate 20, SpO2 98 %. Physical Exam: General: pleasant, black male who is laying in bed in NAD HEENT: head is normocephalic, atraumatic.  Sclera are noninjected.  PERRL.  Ears and nose without any masses or lesions.  Mouth is pink and moist Heart: regular rhythm, but tachycardic.  Normal s1,s2. No obvious gallops, or rubs noted. + murmur  Palpable radial and pedal pulses bilaterally Lungs: CTAB, no wheezes, rhonchi, or rales noted.  Respiratory effort nonlabored Abd: soft, mild tenderness, distended, high pitched BS, no masses or organomegaly. Reducible umbilical hernia MS: all 4 extremities are symmetrical with no cyanosis, clubbing, or edema. Skin: warm and dry with no masses, lesions, or rashes Psych: A&Ox3 with an appropriate affect.    Results for orders placed or performed during the hospital encounter of 06/11/15 (from the past 48 hour(s))  Type and screen  [place]     Status: None   Collection Time: 06/11/15 10:20 AM  Result Value Ref Range   ABO/RH(D) B POS    Antibody Screen NEG    Sample Expiration 06/14/2015   CBC with Differential/Platelet     Status: Abnormal   Collection Time: 06/11/15 10:22 AM  Result Value Ref Range   WBC 7.8 4.0 - 10.5 K/uL   RBC 3.14 (L) 4.22 - 5.81 MIL/uL   Hemoglobin 9.0 (L) 13.0 - 17.0 g/dL   HCT 27.5 (L) 39.0 - 52.0 %   MCV 87.6 78.0 - 100.0 fL   MCH 28.7 26.0 - 34.0 pg   MCHC 32.7 30.0 - 36.0 g/dL   RDW 15.6 (H) 11.5 - 15.5 %   Platelets 393 150 -  400 K/uL   Neutrophils Relative % 75 %   Neutro Abs 5.9 1.7 - 7.7 K/uL   Lymphocytes Relative 15 %   Lymphs Abs 1.1 0.7 - 4.0 K/uL   Monocytes Relative 9 %   Monocytes Absolute 0.7 0.1 - 1.0 K/uL   Eosinophils Relative 1 %   Eosinophils Absolute 0.1 0.0 - 0.7 K/uL   Basophils Relative 0 %   Basophils Absolute 0.0 0.0 - 0.1 K/uL  Comprehensive metabolic panel     Status: Abnormal   Collection Time: 06/11/15 10:22 AM  Result Value Ref Range   Sodium 136 135 - 145 mmol/L   Potassium 3.0 (L) 3.5 - 5.1 mmol/L   Chloride 105 101 - 111 mmol/L   CO2 23 22 - 32 mmol/L   Glucose, Bld 155 (H) 65 - 99 mg/dL   BUN 8 6 - 20 mg/dL   Creatinine, Ser 0.76 0.61 - 1.24 mg/dL   Calcium 7.0 (L) 8.9 - 10.3 mg/dL   Total Protein 6.5 6.5 - 8.1 g/dL   Albumin 2.5 (L) 3.5 - 5.0 g/dL   AST 21 15 - 41 U/L   ALT 16 (L) 17 - 63 U/L   Alkaline Phosphatase 435 (H) 38 - 126 U/L   Total Bilirubin 0.8 0.3 - 1.2 mg/dL   GFR calc non Af Amer >60 >60 mL/min   GFR calc Af Amer >60 >60 mL/min    Comment: (NOTE) The eGFR has been calculated using the CKD EPI equation. This calculation has not been validated in all clinical situations. eGFR's persistently <60 mL/min signify possible Chronic Kidney Disease.    Anion gap 8 5 - 15  Protime-INR     Status: None   Collection Time: 06/11/15 10:22 AM  Result Value Ref Range   Prothrombin Time 14.9 11.6 - 15.2 seconds   INR 1.15 0.00 - 1.49  I-Stat CG4 Lactic Acid, ED     Status: None   Collection Time: 06/11/15 10:31 AM  Result Value Ref Range   Lactic Acid, Venous 1.12 0.5 - 2.0 mmol/L  I-Stat CG4 Lactic Acid, ED     Status: None   Collection Time: 06/11/15  1:21 PM  Result Value Ref Range   Lactic Acid, Venous 0.90 0.5 - 2.0 mmol/L   Ct Abdomen Pelvis W Contrast  06/11/2015  ADDENDUM REPORT: 06/11/2015 12:31 ADDENDUM: The salient findings were discussed with Zenovia Jarred on 06/11/2015 at 12:31 pm. Electronically Signed   By: Nolon Nations M.D.   On:  06/11/2015 12:31  06/11/2015  CLINICAL DATA:  abdominal pain awoke him this am. Denies n/v/d or fevers. No BM since this past Sunday. History of prostate cancer. ^147m OMNIPAQUE IOHEXOL 300  MG/ML SOLN EXAM: CT ABDOMEN AND PELVIS WITH CONTRAST TECHNIQUE: Multidetector CT imaging of the abdomen and pelvis was performed using the standard protocol following bolus administration of intravenous contrast. CONTRAST:  128m OMNIPAQUE IOHEXOL 300 MG/ML  SOLN COMPARISON:  Lower chest: There bilateral pleural effusions. Bibasilar atelectasis. Coronary artery calcifications are present. Heart size appears normal. Upper abdomen: No focal abnormality identified within the liver, spleen, pancreas, or adrenal glands. Gallbladder is present. Bilateral renal cysts are present. No intrarenal or ureteral stones. Gastrointestinal tract: There are numerous dilated proximal small bowel loops contain contrast. In the left central lower abdomen there is a non-opacified loop of small bowel with surrounding mesenteric fluid. Asleep is felt to be the point of obstruction likely represents a closed loop obstruction. The small bowel loops distal to this level are relatively collapsed. There are numerous colonic diverticula but no evidence for acute diverticulitis. The colon is redundant. The appendix is well seen and has a normal appearance. Pelvis: The prostate is enlarged. Urinary bladder otherwise has a normal appearance. No free pelvic fluid. No pelvic adenopathy. Retroperitoneum: Atherosclerotic calcification identified within the aorta. Abdominal wall: Fat containing umbilical hernia. Osseous structures: Interval progression of significant osseous metastatic disease.2 diffuse sclerotic metastases involve the pelvis, proximal femurs, and all of the visualized vertebrae. FINDINGS: 1. Findings consistent with closed loop obstruction of the mid to distal small bowel. Given the presence of surrounding mesenteric fluid, perforation should be  considered. There is no free intraperitoneal air however. 2. Coronary artery disease. 3. Normal appendix. 4. Colonic diverticulosis. 5. Prostatic enlargement. 6. Umbilical hernia. 7. Progression of diffuse sclerotic metastases. Electronically Signed: By: ENolon NationsM.D. On: 06/11/2015 12:21       Assessment/Plan 1. SBO -Dr. HExcell Seltzerand I have reviewed the patient's CT scan.  We do not feel that a perforation is present.  He does have what appears to be a small bowel obstruction.  Given his normal WBC and mildly tender abdominal exam, we do not feel as if he needs an operation emergently.  We will place an NGT and start the small bowel obstruction protocol.  The patient has never had intra-abdominal surgery, which puts adhesive disease as a much less likely source for his bowel obstruction.  There would be a concern for a small bowel tumor given his recent GI bleed and his diagnosis of stage 4 prostate cancer.  We have discussed all of this with the patient who understands.  If the patient is not improved by tomorrow, we will likely plan surgical intervention tomorrow. 2. HTN/tachycardia -hold coreg -curb sided medicine who recommended to hold diltiazem and to start lopressor 2.583mq 6 hrs to help with BP and with rate control for now while NPO 3. DM -SSI -hold home oral anti-glycemic agents 4. Anemia -secondary to acute blood loss recently and anemia of chronic disease -will hold on chemical prophylaxis, at least today and tomorrow while deciding whether he will need an operation.  He will also likely drop as he will equilibrate after hydration. 5. Stage 4 prostate cancer -no acute needs at this time.  He will need to follow up with Dr. ShAlen Blewnce he is able to go home.   Sharonna Vinje E 06/11/2015, 2:10 PM Pager: [phone]

## 2015-06-11 NOTE — ED Provider Notes (Signed)
CSN: [id number]     Arrival date & time 06/11/15  8453 History   First MD Initiated Contact with Patient 06/11/15 1004     Chief Complaint  Patient presents with  . Abdominal Pain     (Consider location/radiation/quality/duration/timing/severity/associated sxs/prior Treatment) HPI   Patient is a 79 year old male with history of hypertension diabetes, metastatic prostate cancer, and recent admission for GI bleed presenting today with diffuse abdominal pain. Patient was admitted 2 days ago and had an EGD which was normal. He was discharged home after receiving 2 units of blood in the hospital. He had a colonoscopy 2 days ago which showed no evidence of bleeding. Patient reports that the last 2 days, since the colonoscopy, he's been unable to pass gas or stool. He feels bloated, has waxing and waning periods of pain starting this morning. Patient denies vomiting, fevers. He is tachycardic and in mild distress on initial evaluation.  Of note patient was admitted 2 weeks ago for the GI bleed and then in May was admitted for diffuse abdominal pain thought to be from his bony metastasis.  Past Medical History  Diagnosis Date  . Hypertension   . Diabetes mellitus   . Heart murmur     SYSTOLIC MURMUR AT BASE  . Hyperlipidemia   . Prostate cancer ([place])   . H/O: GI bleed   . Tachycardia    Past Surgical History  Procedure Laterality Date  . Tonsillectomy      IN [facility]  . Hemorroidectomy  1969  . Esophagogastroduodenoscopy N/A 05/28/2015    Procedure: ESOPHAGOGASTRODUODENOSCOPY (EGD);  Surgeon: [name], MD;  Location: [name];  Service: Endoscopy;  Laterality: N/A;  . Hernia repair  AFE 16-17    bilateral inguinal hernias  . Hernia repair  1981    DR Smyer   Family History  Problem Relation Age of Onset  . Cancer Neg Hx   . Kidney failure Brother   . Kidney failure Sister    Social History  Substance Use Topics  . Smoking status: Former Smoker -- 0.25 packs/day for 20  years    Types: Cigarettes    Quit date: 07/12/1979  . Smokeless tobacco: Never Used  . Alcohol Use: No    Review of Systems  Constitutional: Negative for activity change.  HENT: Negative for congestion.   Eyes: Negative for discharge.  Respiratory: Negative for shortness of breath.   Cardiovascular: Negative for chest pain.  Gastrointestinal: Positive for abdominal pain. Negative for nausea, vomiting, diarrhea, blood in stool and rectal pain.  Genitourinary: Negative for dysuria.  Neurological: Negative for dizziness.  Psychiatric/Behavioral: Negative for agitation.      Allergies  Review of patient's allergies indicates no known allergies.  Home Medications   Prior to Admission medications   Medication Sig Start Date End Date Taking? Authorizing Provider  abiraterone Acetate (ZYTIGA) 250 MG tablet Take 4 tablets (1,000 mg total) by mouth daily. Take on an empty stomach 1 hour before or 2 hours after a meal 05/22/15  Yes Wyatt Portela, MD  ACCU-CHEK SOFTCLIX LANCETS lancets Use as instructed to check blood sugar 2 times per day dx code E11.65 10/23/14  Yes Elayne Snare, MD  acetaminophen (TYLENOL) 650 MG CR tablet Take 1 tablet (650 mg total) by mouth every 8 (eight) hours as needed for pain. 05/30/15  Yes Florencia Reasons, MD  atorvastatin (LIPITOR) 10 MG tablet take 1 tablet by mouth once daily 07/09/14  Yes Darlin Coco, MD  Blood Glucose Monitoring Suppl (  ACCU-CHEK AVIVA PLUS) W/DEVICE KIT Use to check blood sugar 2 times per day dx code E11.65 10/23/14  Yes Elayne Snare, MD  carvedilol (COREG) 12.5 MG tablet Take 1 tablet (12.5 mg total) by mouth 2 (two) times daily with a meal. 08/19/14  Yes Darlin Coco, MD  Cholecalciferol (VITAMIN D PO) Take 5,000 Units by mouth daily. Taking 5000 daily   Yes Historical Provider, MD  COMBIGAN 0.2-0.5 % ophthalmic solution Place 1 drop into the right eye daily.  04/09/13  Yes Historical Provider, MD  Cyanocobalamin (VITAMIN B-12 PO) Take 1 tablet  by mouth daily.   Yes Historical Provider, MD  diltiazem (TIAZAC) 360 MG 24 hr capsule Take 360 mg by mouth daily.     Yes Historical Provider, MD  docusate sodium (COLACE) 100 MG capsule Take 100 mg by mouth daily.    Yes Historical Provider, MD  glucose blood (ACCU-CHEK AVIVA PLUS) test strip Use as instructed to check blood sugar 2 times per day dx code E11.65 10/23/14  Yes Elayne Snare, MD  guaiFENesin (ROBITUSSIN) 100 MG/5ML liquid Take 200 mg by mouth 3 (three) times daily as needed for cough.   Yes Historical Provider, MD  insulin aspart (NOVOLOG FLEXPEN) 100 UNIT/ML FlexPen Inject 12 units 3 times per day with meals 12/05/14  Yes Elayne Snare, MD  Insulin Detemir (LEVEMIR) 100 UNIT/ML Pen Inject 12 Units into the skin daily at 10 pm.   Yes Historical Provider, MD  Insulin Pen Needle (B-D UF III MINI PEN NEEDLES) 31G X 5 MM MISC 1 each by Does not apply route 2 (two) times daily. 04/16/14  Yes Elayne Snare, MD  Liraglutide 18 MG/3ML SOPN Inject 1.8 mg into the skin daily. 06/09/14  Yes Elayne Snare, MD  megestrol (MEGACE) 400 MG/10ML suspension Take 10 mLs (400 mg total) by mouth 2 (two) times daily. 05/18/15  Yes Wyatt Portela, MD  oxyCODONE-acetaminophen (PERCOCET/ROXICET) 5-325 MG tablet Take 1 tablet by mouth every 4 (four) hours as needed for moderate pain or severe pain. 05/30/15  Yes Florencia Reasons, MD  pioglitazone (ACTOS) 30 MG tablet Take 1 tablet (30 mg total) by mouth daily. 01/19/15  Yes Elayne Snare, MD  prednisoLONE acetate (PRED FORTE) 1 % ophthalmic suspension Place 1 drop into both eyes 2 (two) times daily.  04/25/13  Yes Historical Provider, MD  tamsulosin (FLOMAX) 0.4 MG CAPS capsule Take 0.4 mg by mouth daily.  08/02/13  Yes Historical Provider, MD   BP 150/51 mmHg  Pulse 124  Temp(Src) 97.7 F (36.5 C) (Oral)  Resp 20  Ht 6' (1.829 m)  Wt 210 lb 1.6 oz (95.3 kg)  BMI 28.49 kg/m2  SpO2 100% Physical Exam  Constitutional: He is oriented to person, place, and time. He appears  well-nourished.  HENT:  Head: Normocephalic.  Mouth/Throat: Oropharynx is clear and moist.  Eyes: Conjunctivae are normal.  Neck: No tracheal deviation present.  Cardiovascular: Normal rate.   Pulmonary/Chest: Effort normal. No stridor. No respiratory distress.  Abdominal: Soft. There is no guarding.  Distention   Musculoskeletal: Normal range of motion. He exhibits no edema.  Neurological: He is oriented to person, place, and time. No cranial nerve deficit.  Skin: Skin is warm and dry. No rash noted. He is not diaphoretic.  Psychiatric: He has a normal mood and affect.  Nursing note and vitals reviewed.   ED Course  Procedures (including critical care time) Labs Review Labs Reviewed  CBC WITH DIFFERENTIAL/PLATELET - Abnormal; Notable for the following:  RBC 3.14 (*)    Hemoglobin 9.0 (*)    HCT 27.5 (*)    RDW 15.6 (*)    All other components within normal limits  COMPREHENSIVE METABOLIC PANEL - Abnormal; Notable for the following:    Potassium 3.0 (*)    Glucose, Bld 155 (*)    Calcium 7.0 (*)    Albumin 2.5 (*)    ALT 16 (*)    Alkaline Phosphatase 435 (*)    All other components within normal limits  BASIC METABOLIC PANEL - Abnormal; Notable for the following:    CO2 20 (*)    Glucose, Bld 148 (*)    Calcium 6.8 (*)    All other components within normal limits  CBC - Abnormal; Notable for the following:    RBC 3.29 (*)    Hemoglobin 9.3 (*)    HCT 28.7 (*)    RDW 15.8 (*)    All other components within normal limits  GLUCOSE, CAPILLARY - Abnormal; Notable for the following:    Glucose-Capillary 146 (*)    All other components within normal limits  GLUCOSE, CAPILLARY - Abnormal; Notable for the following:    Glucose-Capillary 135 (*)    All other components within normal limits  GLUCOSE, CAPILLARY - Abnormal; Notable for the following:    Glucose-Capillary 135 (*)    All other components within normal limits  GLUCOSE, CAPILLARY - Abnormal; Notable for the  following:    Glucose-Capillary 134 (*)    All other components within normal limits  GLUCOSE, CAPILLARY - Abnormal; Notable for the following:    Glucose-Capillary 108 (*)    All other components within normal limits  GLUCOSE, CAPILLARY - Abnormal; Notable for the following:    Glucose-Capillary 134 (*)    All other components within normal limits  CBC - Abnormal; Notable for the following:    RBC 2.93 (*)    Hemoglobin 8.4 (*)    HCT 26.6 (*)    RDW 16.5 (*)    All other components within normal limits  GLUCOSE, CAPILLARY - Abnormal; Notable for the following:    Glucose-Capillary 161 (*)    All other components within normal limits  GLUCOSE, CAPILLARY - Abnormal; Notable for the following:    Glucose-Capillary 161 (*)    All other components within normal limits  GLUCOSE, CAPILLARY - Abnormal; Notable for the following:    Glucose-Capillary 163 (*)    All other components within normal limits  SURGICAL PCR SCREEN  PROTIME-INR  BASIC METABOLIC PANEL  I-STAT CG4 LACTIC ACID, ED  I-STAT CG4 LACTIC ACID, ED  TYPE AND SCREEN  PREPARE RBC (CROSSMATCH)  SURGICAL PATHOLOGY    Imaging Review Ct Abdomen Pelvis W Contrast  06/11/2015  ADDENDUM REPORT: 06/11/2015 12:31 ADDENDUM: The salient findings were discussed with Zenovia Jarred on 06/11/2015 at 12:31 pm. Electronically Signed   By: Nolon Nations M.D.   On: 06/11/2015 12:31  06/11/2015  CLINICAL DATA:  abdominal pain awoke him this am. Denies n/v/d or fevers. No BM since this past Sunday. History of prostate cancer. ^112m OMNIPAQUE IOHEXOL 300 MG/ML SOLN EXAM: CT ABDOMEN AND PELVIS WITH CONTRAST TECHNIQUE: Multidetector CT imaging of the abdomen and pelvis was performed using the standard protocol following bolus administration of intravenous contrast. CONTRAST:  1058mOMNIPAQUE IOHEXOL 300 MG/ML  SOLN COMPARISON:  Lower chest: There bilateral pleural effusions. Bibasilar atelectasis. Coronary artery calcifications are present.  Heart size appears normal. Upper abdomen: No focal abnormality identified within the liver,  spleen, pancreas, or adrenal glands. Gallbladder is present. Bilateral renal cysts are present. No intrarenal or ureteral stones. Gastrointestinal tract: There are numerous dilated proximal small bowel loops contain contrast. In the left central lower abdomen there is a non-opacified loop of small bowel with surrounding mesenteric fluid. Asleep is felt to be the point of obstruction likely represents a closed loop obstruction. The small bowel loops distal to this level are relatively collapsed. There are numerous colonic diverticula but no evidence for acute diverticulitis. The colon is redundant. The appendix is well seen and has a normal appearance. Pelvis: The prostate is enlarged. Urinary bladder otherwise has a normal appearance. No free pelvic fluid. No pelvic adenopathy. Retroperitoneum: Atherosclerotic calcification identified within the aorta. Abdominal wall: Fat containing umbilical hernia. Osseous structures: Interval progression of significant osseous metastatic disease.2 diffuse sclerotic metastases involve the pelvis, proximal femurs, and all of the visualized vertebrae. FINDINGS: 1. Findings consistent with closed loop obstruction of the mid to distal small bowel. Given the presence of surrounding mesenteric fluid, perforation should be considered. There is no free intraperitoneal air however. 2. Coronary artery disease. 3. Normal appendix. 4. Colonic diverticulosis. 5. Prostatic enlargement. 6. Umbilical hernia. 7. Progression of diffuse sclerotic metastases. Electronically Signed: By: Nolon Nations M.D. On: 06/11/2015 12:21   Portable Chest Xray - Atelectasis  06/12/2015  CLINICAL DATA:  Aspiration of vomitus, initial encounter. EXAM: PORTABLE CHEST 1 VIEW COMPARISON:  Nov 09, 2014. FINDINGS: Stable cardiomediastinal silhouette. Hypoinflation of the lungs is noted. Nasogastric tube tip is seen in  proximal stomach. No pneumothorax is noted. Mild central pulmonary vascular congestion is noted. Small amount of fluid is noted in the right minor fissure. Left basilar opacity is noted concerning for pneumonia or atelectasis with associated pleural effusion. Bony thorax is unremarkable. IMPRESSION: Hypoinflation of the lungs is noted. Mild central pulmonary vascular congestion is noted. Left basilar opacity is noted concerning for pneumonia or atelectasis with associated pleural effusion. Electronically Signed   By: Marijo Conception, M.D.   On: 06/12/2015 16:43   Dg Abd Portable 1v  06/12/2015  CLINICAL DATA:  Nasogastric tube placement EXAM: PORTABLE ABDOMEN - 1 VIEW COMPARISON:  Yesterday FINDINGS: Advancement of nasogastric tube with tip in the peripatellar region. Unchanged dilation of small bowel. There is a new lucency over the epigastrium and right abdomen which may outline the falciform ligament and the outer wall of a small bowel loop. Critical Value/emergent results were called by telephone at the time of interpretation on 06/12/2015 at 8:07 am to Dr. Saverio Danker , who verbally acknowledged these results. IMPRESSION: 1. Possible new pneumoperitoneum. Depending on exam, confirmation could be obtained by CT or upright/decubitus imaging. 2. Advanced nasogastric tube with tip at the pylorus. 3. Unchanged small bowel distention. Electronically Signed   By: Monte Fantasia M.D.   On: 06/12/2015 08:08   Dg Abd Portable 1v-small Bowel Obstruction Protocol-initial, 8 Hr Delay  06/11/2015  CLINICAL DATA:  NG tube placement EXAM: PORTABLE ABDOMEN - 1 VIEW COMPARISON:  CT of the abdomen and pelvis from the same day. FINDINGS: Side port of an NG tube is just beyond the GE junction. Lung bases are clear. Multiple dilated loops of small bowel are again seen. IMPRESSION: 1. The side port of the NG tube is just beyond the GE junction and could be advanced several cm for more optimal positioning. 2. Persistent  dilation of multiple loops of small bowel. Electronically Signed   By: San Morelle M.D.   On: 06/11/2015  17:17   I have personally reviewed and evaluated these images and lab results as part of my medical decision-making.   EKG Interpretation   Date/Time:  Thursday June 11 2015 13:04:06 EST Ventricular Rate:  109 PR Interval:  164 QRS Duration: 79 QT Interval:  368 QTC Calculation: 496 R Axis:   -7 Text Interpretation:  Sinus tachycardia Borderline T abnormalities,  lateral leads Borderline prolonged QT interval no acute sichemia Confirmed  by Gerald Leitz ([id number]) on 06/11/2015 1:56:41 PM      MDM   Final diagnoses:  SBO (small bowel obstruction) (HCC)  Small bowel obstruction [facility])    Patient is a 79 year old male with recent colonoscopy presenting today with distention and intermittent abdominal pain out of proportion to exam. Patient states the pain started this morning. He reports he has had distention and inability to pass gas or stool since his colonoscopy 2 days ago. Patient had recent hospitalization for GI bleed as well with no source found in either EGD or colonoscopy.  ON my differential is ileus, or mesenteric ischemia. We will get a lactate, labs to assess for anemia, CT abdomen pelvis. Patient has mild tachycardia exam we'll give fluids.  12:55 PM Pt has closed loop bowel obstruction with perforation. In the small bowel which are easily is not usually instrumented during coplonoscopy however the colonoscopy follows exactly the time that his pain started. We have called [facility] and they are awaree. We've also discussed this with surgery and they will come see patient.  Admitted to surgery.   Courteney Julio Alm, MD 06/13/15 [phone]

## 2015-06-11 NOTE — ED Notes (Signed)
Delay due to pt getting a xray before they went to the floor and charge notified.

## 2015-06-11 NOTE — Progress Notes (Signed)
RN notified the MD on call to see if the orders need to be changed to q 4 hours CBG. Awaiting  any new  orders

## 2015-06-11 NOTE — ED Notes (Signed)
Delay in transport, pt needs xray to confirm ng tube placement

## 2015-06-12 ENCOUNTER — Inpatient Hospital Stay (HOSPITAL_COMMUNITY): Payer: Medicare Other | Admitting: Anesthesiology

## 2015-06-12 ENCOUNTER — Ambulatory Visit: Payer: Medicare Other | Admitting: Endocrinology

## 2015-06-12 ENCOUNTER — Inpatient Hospital Stay (HOSPITAL_COMMUNITY): Payer: Medicare Other

## 2015-06-12 ENCOUNTER — Encounter (HOSPITAL_COMMUNITY): Admission: EM | Disposition: A | Discharge status: Skilled Nursing Facility | Payer: Self-pay | Source: Home / Self Care

## 2015-06-12 ENCOUNTER — Encounter (HOSPITAL_COMMUNITY): Payer: Self-pay | Admitting: Certified Registered"

## 2015-06-12 HISTORY — PX: LAPAROTOMY: SHX154

## 2015-06-12 HISTORY — PX: BOWEL RESECTION: SHX1257

## 2015-06-12 LAB — GLUCOSE, CAPILLARY
GLUCOSE-CAPILLARY: 134 mg/dL — AB (ref 65–99)
GLUCOSE-CAPILLARY: 135 mg/dL — AB (ref 65–99)
GLUCOSE-CAPILLARY: 161 mg/dL — AB (ref 65–99)
Glucose-Capillary: 108 mg/dL — ABNORMAL HIGH (ref 65–99)
Glucose-Capillary: 134 mg/dL — ABNORMAL HIGH (ref 65–99)

## 2015-06-12 LAB — SURGICAL PCR SCREEN
MRSA, PCR: NEGATIVE
STAPHYLOCOCCUS AUREUS: NEGATIVE

## 2015-06-12 LAB — BASIC METABOLIC PANEL
Anion gap: 10 (ref 5–15)
BUN: 10 mg/dL (ref 6–20)
CALCIUM: 6.8 mg/dL — AB (ref 8.9–10.3)
CO2: 20 mmol/L — AB (ref 22–32)
CREATININE: 0.86 mg/dL (ref 0.61–1.24)
Chloride: 110 mmol/L (ref 101–111)
Glucose, Bld: 148 mg/dL — ABNORMAL HIGH (ref 65–99)
Potassium: 3.5 mmol/L (ref 3.5–5.1)
SODIUM: 140 mmol/L (ref 135–145)

## 2015-06-12 LAB — CBC
HCT: 28.7 % — ABNORMAL LOW (ref 39.0–52.0)
Hemoglobin: 9.3 g/dL — ABNORMAL LOW (ref 13.0–17.0)
MCH: 28.3 pg (ref 26.0–34.0)
MCHC: 32.4 g/dL (ref 30.0–36.0)
MCV: 87.2 fL (ref 78.0–100.0)
Platelets: 400 10*3/uL (ref 150–400)
RBC: 3.29 MIL/uL — ABNORMAL LOW (ref 4.22–5.81)
RDW: 15.8 % — ABNORMAL HIGH (ref 11.5–15.5)
WBC: 9.9 10*3/uL (ref 4.0–10.5)

## 2015-06-12 LAB — PREPARE RBC (CROSSMATCH)

## 2015-06-12 SURGERY — LAPAROTOMY, EXPLORATORY
Anesthesia: General

## 2015-06-12 MED ORDER — ONDANSETRON HCL 4 MG/2ML IJ SOLN
INTRAMUSCULAR | Status: DC | PRN
  Administered 2015-06-12: 4 mg via INTRAVENOUS

## 2015-06-12 MED ORDER — HYDROMORPHONE HCL 2 MG/ML IJ SOLN
INTRAMUSCULAR | Status: AC
  Filled 2015-06-12: qty 1

## 2015-06-12 MED ORDER — LACTATED RINGERS IV SOLN: INTRAVENOUS | Status: DC

## 2015-06-12 MED ORDER — ALBUTEROL SULFATE HFA 108 (90 BASE) MCG/ACT IN AERS
INHALATION_SPRAY | RESPIRATORY_TRACT | Status: DC | PRN
  Administered 2015-06-12 (×2): 3 via RESPIRATORY_TRACT

## 2015-06-12 MED ORDER — HYDROMORPHONE HCL 1 MG/ML IJ SOLN
INTRAMUSCULAR | Status: AC
  Filled 2015-06-12: qty 2

## 2015-06-12 MED ORDER — LACTATED RINGERS IV SOLN
INTRAVENOUS | Status: DC | PRN
  Administered 2015-06-12: 13:00:00 via INTRAVENOUS

## 2015-06-12 MED ORDER — FENTANYL CITRATE (PF) 250 MCG/5ML IJ SOLN
INTRAMUSCULAR | Status: AC
  Filled 2015-06-12: qty 5

## 2015-06-12 MED ORDER — SUGAMMADEX SODIUM 200 MG/2ML IV SOLN
INTRAVENOUS | Status: DC | PRN
  Administered 2015-06-12: 300 mg via INTRAVENOUS

## 2015-06-12 MED ORDER — FENTANYL CITRATE (PF) 100 MCG/2ML IJ SOLN
INTRAMUSCULAR | Status: DC | PRN
  Administered 2015-06-12 (×4): 100 ug via INTRAVENOUS
  Administered 2015-06-12 (×2): 50 ug via INTRAVENOUS
  Administered 2015-06-12: 100 ug via INTRAVENOUS
  Administered 2015-06-12 (×3): 50 ug via INTRAVENOUS

## 2015-06-12 MED ORDER — EPINEPHRINE HCL 0.1 MG/ML IJ SOSY
PREFILLED_SYRINGE | INTRAMUSCULAR | Status: DC | PRN
  Administered 2015-06-12 (×2): 500 ug via INTRAVENOUS

## 2015-06-12 MED ORDER — SODIUM CHLORIDE 0.9 % IV SOLN: 10.0000 mL/h | Freq: Once | INTRAVENOUS | Status: DC

## 2015-06-12 MED ORDER — ROCURONIUM BROMIDE 100 MG/10ML IV SOLN
INTRAVENOUS | Status: DC | PRN
  Administered 2015-06-12: 30 mg via INTRAVENOUS
  Administered 2015-06-12: 10 mg via INTRAVENOUS

## 2015-06-12 MED ORDER — HYDROMORPHONE HCL 1 MG/ML IJ SOLN: 0.2500 mg | INTRAMUSCULAR | Status: DC | PRN

## 2015-06-12 MED ORDER — SUGAMMADEX SODIUM 500 MG/5ML IV SOLN
INTRAVENOUS | Status: AC
Start: 2015-06-12 — End: 2015-06-12
  Filled 2015-06-12: qty 5

## 2015-06-12 MED ORDER — POTASSIUM CHLORIDE IN NACL 20-0.9 MEQ/L-% IV SOLN
INTRAVENOUS | Status: AC
  Filled 2015-06-12: qty 1000

## 2015-06-12 MED ORDER — PHENYLEPHRINE HCL 10 MG/ML IJ SOLN
INTRAMUSCULAR | Status: DC | PRN
  Administered 2015-06-12 (×3): 80 ug via INTRAVENOUS

## 2015-06-12 MED ORDER — LIDOCAINE HCL (CARDIAC) 20 MG/ML IV SOLN
INTRAVENOUS | Status: DC | PRN
  Administered 2015-06-12: 100 mg via INTRAVENOUS
  Administered 2015-06-12: 50 mg via INTRAVENOUS
  Administered 2015-06-12: 100 mg via INTRAVENOUS

## 2015-06-12 MED ORDER — MIDAZOLAM HCL 5 MG/5ML IJ SOLN
INTRAMUSCULAR | Status: DC | PRN
  Administered 2015-06-12: 1 mg via INTRAVENOUS

## 2015-06-12 MED ORDER — FENTANYL CITRATE (PF) 100 MCG/2ML IJ SOLN
INTRAMUSCULAR | Status: AC
  Filled 2015-06-12: qty 2

## 2015-06-12 MED ORDER — PROPOFOL 10 MG/ML IV BOLUS
INTRAVENOUS | Status: AC
  Filled 2015-06-12: qty 20

## 2015-06-12 MED ORDER — FENTANYL CITRATE (PF) 100 MCG/2ML IJ SOLN
INTRAMUSCULAR | Status: AC
  Filled 2015-06-12: qty 4

## 2015-06-12 MED ORDER — 0.9 % SODIUM CHLORIDE (POUR BTL) OPTIME
TOPICAL | Status: DC | PRN
  Administered 2015-06-12: 1000 mL
  Administered 2015-06-12: 3000 mL
  Administered 2015-06-12: 1000 mL

## 2015-06-12 MED ORDER — LACTATED RINGERS IV SOLN
INTRAVENOUS | Status: DC | PRN
  Administered 2015-06-12 (×3): via INTRAVENOUS

## 2015-06-12 MED ORDER — MORPHINE SULFATE (PF) 2 MG/ML IV SOLN
2.0000 mg | INTRAVENOUS | Status: DC | PRN
  Administered 2015-06-12: 2 mg via INTRAVENOUS
  Administered 2015-06-13: 4 mg via INTRAVENOUS
  Administered 2015-06-13: 2 mg via INTRAVENOUS
  Administered 2015-06-13: 3 mg via INTRAVENOUS
  Administered 2015-06-13: 4 mg via INTRAVENOUS
  Administered 2015-06-13: 2 mg via INTRAVENOUS
  Administered 2015-06-13 (×3): 4 mg via INTRAVENOUS
  Administered 2015-06-14: 2 mg via INTRAVENOUS
  Administered 2015-06-14: 4 mg via INTRAVENOUS
  Administered 2015-06-14: 2 mg via INTRAVENOUS
  Administered 2015-06-14 (×2): 4 mg via INTRAVENOUS
  Administered 2015-06-15 (×3): 2 mg via INTRAVENOUS
  Administered 2015-06-15 – 2015-06-16 (×3): 4 mg via INTRAVENOUS
  Administered 2015-06-16: 2 mg via INTRAVENOUS
  Administered 2015-06-16 – 2015-06-17 (×2): 4 mg via INTRAVENOUS
  Administered 2015-06-18: 2 mg via INTRAVENOUS
  Administered 2015-06-18 (×2): 4 mg via INTRAVENOUS
  Administered 2015-06-19 – 2015-06-20 (×5): 2 mg via INTRAVENOUS
  Administered 2015-06-20: 4 mg via INTRAVENOUS
  Administered 2015-06-20 – 2015-06-24 (×13): 2 mg via INTRAVENOUS
  Filled 2015-06-12 (×3): qty 1
  Filled 2015-06-12: qty 2
  Filled 2015-06-12: qty 1
  Filled 2015-06-12: qty 2
  Filled 2015-06-12 (×2): qty 1
  Filled 2015-06-12 (×3): qty 2
  Filled 2015-06-12 (×3): qty 1
  Filled 2015-06-12 (×2): qty 2
  Filled 2015-06-12: qty 1
  Filled 2015-06-12 (×2): qty 2
  Filled 2015-06-12 (×2): qty 1
  Filled 2015-06-12 (×3): qty 2
  Filled 2015-06-12 (×2): qty 1
  Filled 2015-06-12: qty 2
  Filled 2015-06-12: qty 1
  Filled 2015-06-12: qty 2
  Filled 2015-06-12 (×4): qty 1
  Filled 2015-06-12: qty 2
  Filled 2015-06-12 (×3): qty 1
  Filled 2015-06-12 (×2): qty 2
  Filled 2015-06-12 (×6): qty 1

## 2015-06-12 MED ORDER — ROCURONIUM BROMIDE 100 MG/10ML IV SOLN
INTRAVENOUS | Status: AC
  Filled 2015-06-12: qty 1

## 2015-06-12 MED ORDER — PHENYLEPHRINE 40 MCG/ML (10ML) SYRINGE FOR IV PUSH (FOR BLOOD PRESSURE SUPPORT)
PREFILLED_SYRINGE | INTRAVENOUS | Status: AC
  Filled 2015-06-12: qty 10

## 2015-06-12 MED ORDER — PROPOFOL 10 MG/ML IV BOLUS
INTRAVENOUS | Status: DC | PRN
  Administered 2015-06-12: 160 mg via INTRAVENOUS

## 2015-06-12 MED ORDER — LIDOCAINE HCL (CARDIAC) 20 MG/ML IV SOLN
INTRAVENOUS | Status: AC
  Filled 2015-06-12: qty 5

## 2015-06-12 MED ORDER — DEXTROSE 5 % IV SOLN
2.0000 g | INTRAVENOUS | Status: AC
  Administered 2015-06-12: 2 g via INTRAVENOUS
  Filled 2015-06-12: qty 2

## 2015-06-12 MED ORDER — ATROPINE SULFATE 0.4 MG/ML IJ SOLN
INTRAMUSCULAR | Status: DC | PRN
  Administered 2015-06-12: 0.2 mg via INTRAVENOUS

## 2015-06-12 MED ORDER — CEFOTETAN DISODIUM-DEXTROSE 2-2.08 GM-% IV SOLR
INTRAVENOUS | Status: AC
  Filled 2015-06-12: qty 50

## 2015-06-12 MED ORDER — PROMETHAZINE HCL 25 MG/ML IJ SOLN
6.2500 mg | INTRAMUSCULAR | Status: DC | PRN
Start: 2015-06-12 — End: 2015-06-12

## 2015-06-12 MED ORDER — CALCIUM CHLORIDE 10 % IV SOLN
INTRAVENOUS | Status: DC | PRN
  Administered 2015-06-12: .3 g via INTRAVENOUS

## 2015-06-12 MED ORDER — HYDROMORPHONE HCL 1 MG/ML IJ SOLN
INTRAMUSCULAR | Status: DC | PRN
  Administered 2015-06-12 (×2): 1 mg via INTRAVENOUS

## 2015-06-12 MED ORDER — LABETALOL HCL 5 MG/ML IV SOLN
INTRAVENOUS | Status: DC | PRN
  Administered 2015-06-12 (×2): 5 mg via INTRAVENOUS

## 2015-06-12 MED ORDER — ALBUMIN HUMAN 5 % IV SOLN
INTRAVENOUS | Status: DC | PRN
  Administered 2015-06-12: 16:00:00 via INTRAVENOUS

## 2015-06-12 MED ORDER — SUCCINYLCHOLINE CHLORIDE 20 MG/ML IJ SOLN
INTRAMUSCULAR | Status: DC | PRN
  Administered 2015-06-12: 100 mg via INTRAVENOUS

## 2015-06-12 SURGICAL SUPPLY — 57 items
APPLICATOR COTTON TIP 6IN STRL (MISCELLANEOUS) ×2 IMPLANT
BLADE EXTENDED COATED 6.5IN (ELECTRODE) IMPLANT
BLADE HEX COATED 2.75 (ELECTRODE) ×3 IMPLANT
BLADE SURG SZ10 CARB STEEL (BLADE) ×1 IMPLANT
CLIP TI LARGE 6 (CLIP) IMPLANT
COVER MAYO STAND STRL (DRAPES) ×3 IMPLANT
COVER SURGICAL LIGHT HANDLE (MISCELLANEOUS) ×3 IMPLANT
DRAPE LAPAROSCOPIC ABDOMINAL (DRAPES) ×3 IMPLANT
DRAPE SHEET LG 3/4 BI-LAMINATE (DRAPES) IMPLANT
DRAPE WARM FLUID 44X44 (DRAPE) ×3 IMPLANT
DRSG OPSITE POSTOP 4X10 (GAUZE/BANDAGES/DRESSINGS) ×2 IMPLANT
ELECT REM PT RETURN 9FT ADLT (ELECTROSURGICAL) ×3
ELECTRODE REM PT RTRN 9FT ADLT (ELECTROSURGICAL) ×1 IMPLANT
GAUZE SPONGE 4X4 12PLY STRL (GAUZE/BANDAGES/DRESSINGS) ×1 IMPLANT
GLOVE BIO SURGEON STRL SZ7 (GLOVE) ×4 IMPLANT
GLOVE BIOGEL PI IND STRL 7.0 (GLOVE) ×1 IMPLANT
GLOVE BIOGEL PI IND STRL 7.5 (GLOVE) ×1 IMPLANT
GLOVE BIOGEL PI INDICATOR 7.0 (GLOVE) ×8
GLOVE BIOGEL PI INDICATOR 7.5 (GLOVE) ×8
GLOVE ECLIPSE 7.5 STRL STRAW (GLOVE) ×6 IMPLANT
GLOVE INDICATOR 8.0 STRL GRN (GLOVE) ×2 IMPLANT
GLOVE SURG SS PI 7.5 STRL IVOR (GLOVE) ×2 IMPLANT
GOWN STRL REUS W/TWL LRG LVL3 (GOWN DISPOSABLE) ×7 IMPLANT
GOWN STRL REUS W/TWL XL LVL3 (GOWN DISPOSABLE) ×6 IMPLANT
KIT BASIN OR (CUSTOM PROCEDURE TRAY) ×3 IMPLANT
LEGGING LITHOTOMY PAIR STRL (DRAPES) IMPLANT
LIGASURE IMPACT 36 18CM CVD LR (INSTRUMENTS) IMPLANT
NS IRRIG 1000ML POUR BTL (IV SOLUTION) ×6 IMPLANT
PACK GENERAL/GYN (CUSTOM PROCEDURE TRAY) ×3 IMPLANT
RELOAD PROXIMATE 75MM BLUE (ENDOMECHANICALS) ×6 IMPLANT
RELOAD STAPLE 75 3.8 BLU REG (ENDOMECHANICALS) IMPLANT
SHEARS HARMONIC ACE PLUS 36CM (ENDOMECHANICALS) IMPLANT
SPONGE LAP 18X18 X RAY DECT (DISPOSABLE) ×2 IMPLANT
STAPLER GUN LINEAR PROX 60 (STAPLE) ×2 IMPLANT
STAPLER PROXIMATE 75MM BLUE (STAPLE) ×2 IMPLANT
STAPLER VISISTAT 35W (STAPLE) ×3 IMPLANT
SUCTION POOLE TIP (SUCTIONS) ×3 IMPLANT
SUT NOV 1 T60/GS (SUTURE) IMPLANT
SUT NOVA 1 T20/GS 25DT (SUTURE) IMPLANT
SUT NOVA NAB DX-16 0-1 5-0 T12 (SUTURE) IMPLANT
SUT NOVA T20/GS 25 (SUTURE) IMPLANT
SUT PDS AB 1 CTX 36 (SUTURE) IMPLANT
SUT PDS AB 1 TP1 96 (SUTURE) ×4 IMPLANT
SUT SILK 2 0 (SUTURE) ×3
SUT SILK 2 0 SH CR/8 (SUTURE) ×3 IMPLANT
SUT SILK 2 0SH CR/8 30 (SUTURE) IMPLANT
SUT SILK 2-0 18XBRD TIE 12 (SUTURE) ×1 IMPLANT
SUT SILK 2-0 30XBRD TIE 12 (SUTURE) IMPLANT
SUT SILK 3 0 (SUTURE) ×3
SUT SILK 3 0 SH CR/8 (SUTURE) ×3 IMPLANT
SUT SILK 3-0 18XBRD TIE 12 (SUTURE) ×2 IMPLANT
SUT VIC AB 3-0 54XBRD REEL (SUTURE) IMPLANT
SUT VIC AB 3-0 BRD 54 (SUTURE)
TOWEL OR 17X26 10 PK STRL BLUE (TOWEL DISPOSABLE) ×6 IMPLANT
TRAY FOLEY W/METER SILVER 14FR (SET/KITS/TRAYS/PACK) ×1 IMPLANT
TRAY FOLEY W/METER SILVER 16FR (SET/KITS/TRAYS/PACK) ×3 IMPLANT
YANKAUER SUCT BULB TIP NO VENT (SUCTIONS) ×3 IMPLANT

## 2015-06-12 NOTE — Op Note (Signed)
Preoperative Diagnosis: Small bowel obstruction (HCC) [K56.69]   Postoprative Diagnosis: Small bowel obstruction ([place]) [K56.69]  Internal hernia   Procedure: Procedure(s): EXPLORATORY LAPAROTOMY SMALL BOWEL RESECTION   Surgeon: [name] T   Assistants: Saverio Danker PA  Anesthesia:  General endotracheal anesthesia  Indications: patient is a 79 year old male who presented yesterday with about 48 hours of mid abdominal pain nausea and vomiting. CT scan has shown evidence of small bowel obstruction with possibly a closed loop obstruction. The patient however had no tenderness, normal white count and he was admitted for bowel rest with NG suction and close observation. Today follow-up x-ray showed no improvement in small bowel obstruction and I recommended proceeding with emergency laparotomy. He has not had any previous abdominal surgery. He does have prostate cancer widely metastatic to bone.  Procedure Detail:  Patient was brought to the operating room, placed in the supine position on the operating table, and general endotracheal anesthesia induced. He received preoperative IV antibiotics. PAS replaced. The abdomen was widely sterilely prepped and draped. Patient timeout was performed and correct procedure verified. The abdomen was explored through a midline incision skirting the umbilicus. On opening the abdomen there was clear fluid that was suctioned. There were multiple significantly dilated loops of proximal small bowel. I traced the bowel distally where it was trapped into the retroperitoneum and more distal of this the small bowel was completely decompressed and traced down to the cecum. This area the retroperitoneum was then exposed and the bowel was seen to be going through a very tight hernia defect on the medial side of the sigmoid colon mesentery where the sigmoid made a complete back-and-forth loop prior to going proximally up into the left colon. Carefully bluntly dissecting  down along side the trapped bowel into the retroperitoneum and mesentery of the sigmoid I was able to deliver a loop of small bowel approximately 10 cm in length that was not perforated but had patchy areas of gangrene and odor. We then proceeded with a small bowel resection. Areas of healthy-appearing bowel proximal and distal to the ischemic area was divided with single firings of the GIA 75 mm stapler. The mesentery of the involved segment was sequentially divided between clamps and tied with 2-0 silk ties and the specimen removed. I then turned my attention back to the defect. There were what appeared to be abnormal peritoneal bands from the medial aspect of the sigmoid colon mesentery down to the retroperitoneum just below the duodenum. This resulted in a tight defect which then extended up along side the fourth portion of the duodenum but not really up under it. It appeared to be somewhat of a variation of a paraduodenal hernia. Rather than try to close this defect I completely divided these peritoneal attachments going down toward the duodenum and inferior to this which did straighten the sigmoid colon out to a more normal orientation and completely opened up this area so that no further incarceration could occur. Following this we completed the small bowel anastomosis with a functional end-to-end anastomosis firing the GIA 75 mm stapler through enterotomies in either end of the small bowel. The staple line was intact without bleeding. The common enterotomy was then closed with a single firing of the TA 60 stapler. The anastomosis was airtight with good blood supply and widely patent. The mesenteric defect was closed with interrupted 20 silks and a reinforcement stitch of 2-0 silk was placed at the crotch of the anastomosis. Following this all gloves and drapes and  instruments were changed. The abdomen was thoroughly irrigated with saline and hemostasis assured. The viscera were returned to anatomic position.  The patient had a approximately 2 cm umbilical hernia and I reduced preperitoneal fat and raised a short skin and subcutaneous flap underneath the umbilicus back to healthy fascia for closure. The midline fascia was then closed with running looped  #1 PDS begun at either end of the incision and tied centrally. subcutaneous tissue was irrigated and skin closed with staples.  Sponge needle and instrument counts were correct.    Findings: As above  Estimated Blood Loss:  Minimal         Drains: none  Blood Given: none          Specimens: portion of small intestine        Complications:  * No complications entered in OR log *         Disposition: PACU - hemodynamically stable.         Condition: stable

## 2015-06-12 NOTE — Anesthesia Postprocedure Evaluation (Signed)
Anesthesia Post Note  Patient: Jeremy Jennings  Procedure(s) Performed: Procedure(s) (LRB): EXPLORATORY LAPAROTOMY (N/A) SMALL BOWEL RESECTION (N/A)  Patient location during evaluation: PACU Anesthesia Type: General Level of consciousness: awake and alert Pain management: pain level controlled Vital Signs Assessment: post-procedure vital signs reviewed and stable Respiratory status: spontaneous breathing, nonlabored ventilation, respiratory function stable and patient connected to nasal cannula oxygen Cardiovascular status: blood pressure returned to baseline and stable Postop Assessment: no signs of nausea or vomiting Anesthetic complications: no    Last Vitals:  Filed Vitals:   06/12/15 1723 06/12/15 2016  BP: 138/73 152/78  Pulse: 106 129  Temp: 36.3 C 36.4 C  Resp: 15 16    Last Pain:  Filed Vitals:   06/12/15 2017  PainSc: 0-No pain                 [name],JAMES TERRILL

## 2015-06-12 NOTE — Anesthesia Procedure Notes (Signed)
Procedure Name: Intubation Date/Time: 06/12/2015 1:57 PM Performed by: Noralyn Pick D Pre-anesthesia Checklist: Patient identified, Emergency Drugs available, Suction available and Patient being monitored Patient Re-evaluated:Patient Re-evaluated prior to inductionOxygen Delivery Method: Circle System Utilized Preoxygenation: Pre-oxygenation with 100% oxygen Intubation Type: IV induction, Rapid sequence and Cricoid Pressure applied Laryngoscope Size: Mac and 4 Tube type: Oral Tube size: 7.5 mm Number of attempts: 1 Airway Equipment and Method: Stylet and Oral airway Placement Confirmation: ETT inserted through vocal cords under direct vision,  positive ETCO2 and breath sounds checked- equal and bilateral Secured at: 22 cm Tube secured with: Tape Dental Injury: Teeth and Oropharynx as per pre-operative assessment

## 2015-06-12 NOTE — Anesthesia Preprocedure Evaluation (Addendum)
Anesthesia Evaluation  Patient identified by MRN, date of birth, ID band Patient awake    Airway Mallampati: II  TM Distance: >3 FB Neck ROM: Full    Dental  (+) Edentulous Upper, Edentulous Lower   Pulmonary neg pulmonary ROS, former smoker,    breath sounds clear to auscultation       Cardiovascular hypertension,  Rhythm:Regular Rate:Normal     Neuro/Psych    GI/Hepatic Bowel obst   Endo/Other  diabetes  Renal/GU    Metastatic prostate ca     Musculoskeletal   Abdominal (+) + obese,   Peds  Hematology  (+) anemia ,   Anesthesia Other Findings   Reproductive/Obstetrics                            Anesthesia Physical Anesthesia Plan  ASA: III  Anesthesia Plan: General   Post-op Pain Management:    Induction: Intravenous  Airway Management Planned: Oral ETT  Additional Equipment:   Intra-op Plan:   Post-operative Plan: Extubation in OR  Informed Consent: I have reviewed the patients History and Physical, chart, labs and discussed the procedure including the risks, benefits and alternatives for the proposed anesthesia with the patient or authorized representative who has indicated his/her understanding and acceptance.   Dental advisory given  Plan Discussed with: CRNA and Surgeon  Anesthesia Plan Comments:         Anesthesia Quick Evaluation

## 2015-06-12 NOTE — Progress Notes (Signed)
Patient ID: Jeremy Jennings, male   DOB: 1936/01/10, 79 y.o.   MRN: [id number]    Subjective: He feels okay. Still intermittent crampy pain that is not severe. No flatus or bowel movements.  Objective: Vital signs in last 24 hours: Temp:  [97.5 F (36.4 C)-98.7 F (37.1 C)] 98.2 F (36.8 C) (12/02 0518) Pulse Rate:  [80-120] 81 (12/02 0518) Resp:  [16-20] 16 (12/02 0518) BP: (114-179)/(59-93) 138/93 mmHg (12/02 0518) SpO2:  [90 %-100 %] 99 % (12/02 0518) Weight:  [95.3 kg (210 lb 1.6 oz)] 95.3 kg (210 lb 1.6 oz) (12/01 1830) Last BM Date: 06/05/15  Intake/Output from previous day: 12/01 0701 - 12/02 0700 In: 1291.7 [I.V.:1041.7; NG/GT:250] Out: -  Intake/Output this shift:    General appearance: alert, cooperative and no distress Resp: clear to auscultation bilaterally GI: remains distended. A few bowel sounds. Tympanitic.  Mild diffuse tenderness which may be a change from yesterday.  Lab Results:   Recent Labs  06/11/15 1022 06/12/15 0532  WBC 7.8 9.9  HGB 9.0* 9.3*  HCT 27.5* 28.7*  PLT 393 400   BMET  Recent Labs  06/11/15 1022 06/12/15 0532  NA 136 140  K 3.0* 3.5  CL 105 110  CO2 23 20*  GLUCOSE 155* 148*  BUN 8 10  CREATININE 0.76 0.86  CALCIUM 7.0* 6.8*     Studies/Results: Ct Abdomen Pelvis W Contrast  06/11/2015  ADDENDUM REPORT: 06/11/2015 12:31 ADDENDUM: The salient findings were discussed with Zenovia Jarred on 06/11/2015 at 12:31 pm. Electronically Signed   By: Nolon Nations M.D.   On: 06/11/2015 12:31  06/11/2015  CLINICAL DATA:  abdominal pain awoke him this am. Denies n/v/d or fevers. No BM since this past Sunday. History of prostate cancer. ^171mL OMNIPAQUE IOHEXOL 300 MG/ML SOLN EXAM: CT ABDOMEN AND PELVIS WITH CONTRAST TECHNIQUE: Multidetector CT imaging of the abdomen and pelvis was performed using the standard protocol following bolus administration of intravenous contrast. CONTRAST:  137mL OMNIPAQUE IOHEXOL 300 MG/ML  SOLN  COMPARISON:  Lower chest: There bilateral pleural effusions. Bibasilar atelectasis. Coronary artery calcifications are present. Heart size appears normal. Upper abdomen: No focal abnormality identified within the liver, spleen, pancreas, or adrenal glands. Gallbladder is present. Bilateral renal cysts are present. No intrarenal or ureteral stones. Gastrointestinal tract: There are numerous dilated proximal small bowel loops contain contrast. In the left central lower abdomen there is a non-opacified loop of small bowel with surrounding mesenteric fluid. Asleep is felt to be the point of obstruction likely represents a closed loop obstruction. The small bowel loops distal to this level are relatively collapsed. There are numerous colonic diverticula but no evidence for acute diverticulitis. The colon is redundant. The appendix is well seen and has a normal appearance. Pelvis: The prostate is enlarged. Urinary bladder otherwise has a normal appearance. No free pelvic fluid. No pelvic adenopathy. Retroperitoneum: Atherosclerotic calcification identified within the aorta. Abdominal wall: Fat containing umbilical hernia. Osseous structures: Interval progression of significant osseous metastatic disease.2 diffuse sclerotic metastases involve the pelvis, proximal femurs, and all of the visualized vertebrae. FINDINGS: 1. Findings consistent with closed loop obstruction of the mid to distal small bowel. Given the presence of surrounding mesenteric fluid, perforation should be considered. There is no free intraperitoneal air however. 2. Coronary artery disease. 3. Normal appendix. 4. Colonic diverticulosis. 5. Prostatic enlargement. 6. Umbilical hernia. 7. Progression of diffuse sclerotic metastases. Electronically Signed: By: Nolon Nations M.D. On: 06/11/2015 12:21   Dg Abd Portable 1v  06/12/2015  CLINICAL DATA:  Nasogastric tube placement EXAM: PORTABLE ABDOMEN - 1 VIEW COMPARISON:  Yesterday FINDINGS: Advancement of  nasogastric tube with tip in the peripatellar region. Unchanged dilation of small bowel. There is a new lucency over the epigastrium and right abdomen which may outline the falciform ligament and the outer wall of a small bowel loop. Critical Value/emergent results were called by telephone at the time of interpretation on 06/12/2015 at 8:07 am to Dr. Saverio Danker , who verbally acknowledged these results. IMPRESSION: 1. Possible new pneumoperitoneum. Depending on exam, confirmation could be obtained by CT or upright/decubitus imaging. 2. Advanced nasogastric tube with tip at the pylorus. 3. Unchanged small bowel distention. Electronically Signed   By: Monte Fantasia M.D.   On: 06/12/2015 08:08   Dg Abd Portable 1v-small Bowel Obstruction Protocol-initial, 8 Hr Delay  06/11/2015  CLINICAL DATA:  NG tube placement EXAM: PORTABLE ABDOMEN - 1 VIEW COMPARISON:  CT of the abdomen and pelvis from the same day. FINDINGS: Side port of an NG tube is just beyond the GE junction. Lung bases are clear. Multiple dilated loops of small bowel are again seen. IMPRESSION: 1. The side port of the NG tube is just beyond the GE junction and could be advanced several cm for more optimal positioning. 2. Persistent dilation of multiple loops of small bowel. Electronically Signed   By: San Morelle M.D.   On: 06/11/2015 17:17    Anti-infectives: Anti-infectives    None      Assessment/Plan: Small bowel obstruction. No previous abdominal surgery. History of metastatic prostate cancer. Plain x-ray this morning shows a question of a small amount of free air although his exam is not really consistent with this. However he clinically and by x-ray does have persistent small bowel obstruction. The small bowel protocol orders were not followed last night. However plain x-rays show persistent obstruction and in this clinical setting I think the best course at this point would be to proceed with laparotomy. This was discussed  with the patient including the indications for the surgery and alternatives. I discussed the nature of the operation as well as risks of anesthetic complications, bleeding, infection, anastomotic leakage. He understands and agrees to proceed.    LOS: 1 day    Rhylin Venters T 06/12/2015

## 2015-06-12 NOTE — Transfer of Care (Signed)
Immediate Anesthesia Transfer of Care Note  Patient: Jeremy Jennings  Procedure(s) Performed: Procedure(s): EXPLORATORY LAPAROTOMY (N/A) SMALL BOWEL RESECTION (N/A)  Patient Location: PACU  Anesthesia Type:General  Level of Consciousness: awake, alert , oriented and patient cooperative  Airway & Oxygen Therapy: Patient Spontanous Breathing and Patient connected to face mask oxygen  Post-op Assessment: Report given to RN, Post -op Vital signs reviewed and stable and Patient moving all extremities X 4  Post vital signs: stable  Last Vitals:  Filed Vitals:   06/12/15 0518 06/12/15 1230  BP: 138/93 159/75  Pulse: 81 121  Temp: 36.8 C   Resp: 16     Complications: No apparent anesthesia complications woke up confused and very conmbative pulling out all IV's and  Part of NG, NG DRAINING NOW  And put to suction , oral pharynx had some  Gastric secretions despite suctioning aggresively prior to pulling ETT. To get a chest XRAY in PACU. Proventil  inhalor  Given x6 puffs for inspiraotory expiratory wheezes

## 2015-06-12 NOTE — Progress Notes (Signed)
Initial Nutrition Assessment  DOCUMENTATION CODES:   Not applicable  INTERVENTION:  - Will monitor for diet advancement versus need for nutrition support s/p surgery - RD will continue to follow per protocol  NUTRITION DIAGNOSIS:   Inadequate oral intake related to inability to eat as evidenced by NPO status.  GOAL:   Patient will meet greater than or equal to 90% of their needs  MONITOR:   Diet advancement, Weight trends, Labs, Skin, I & O's  REASON FOR ASSESSMENT:   Malnutrition Screening Tool  ASSESSMENT:   79 yo black male who has a history of stage 4 prostate cancer, HTN, DM, tachycardia, recent GI bleed. He had an endoscopy at that time of admission but apparently deferred an inpatient colonoscopy. He just had his colonoscopy done by Dr. Watt Climes on Tuesday. By report, this was normal with no source of bleeding identified. He denies any further bloody BMs sin79 yo black male who has a history of stage 4 prostate cancer, HTN, DM, tachycardia, recent GI bleed. He had an endoscopy at that time of admission but apparently deferred an inpatient colonoscopy. He just had his colonoscopy done by Dr. Watt Climes on Tuesday. By report, this was normal with no source of bleeding identified. He denies any further bloody BMs since this admission. This morning around 6 or 7 am, the patient began having centralized crampy abdominal pain. He denies nausea or vomiting, but admits to no flatus. He deals with constipation on a regular basis. Due to persistent pain, the patient presented to [facility] for further evaluation.ce this admission. This morning around 6 or 7 am, the patient began having centralized crampy abdominal pain. He denies nausea or vomiting, but admits to no flatus. He deals with constipation on a regular basis. Due to persistent pain, the patient presented to [facility] for further evaluation.  Pt seen for MST. He has been NPO since admission and is currently out of the room to OR for  laparotomy related to SBO. Unable to obtain information from pt and no family or visitors present at this time.   Pt was seen by RD 11/17 and was stated to be severely malnourished at that time; will monitor for current states once able to obtain information concerning PO intakes PTA and ability to perform physical assessment.   Per chart review, pt lost 5 lbs from 05/01/15-05/18/15 and then subsequently gained 4 lbs from 05/18/15-05/29/15. Current weight of 210 lbs is the same as weight 05/29/15.   Unable to meet needs currently; will monitor for plan for diet advancement versus nutrition support s/p surgery. Medications reviewed. Labs reviewed; CBGs: 108-183 mg/dL, Ca: 6.8 mg/dL.   Diet Order:  Diet NPO time specified Diet NPO time specified  Skin:  Reviewed, no issues  Last BM:  PTA  Height:   Ht Readings from Last 1 Encounters:  06/11/15 6' (1.829 m)    Weight:   Wt Readings from Last 1 Encounters:  06/11/15 210 lb 1.6 oz (95.3 kg)    Ideal Body Weight:  80.91 kg (kg)  BMI:  Body mass index is 28.49 kg/(m^2).  Estimated Nutritional Needs:   Kcal:  [id number]  Protein:  115-130 grams  Fluid:  2.2 L/day  EDUCATION NEEDS:   No education needs identified at this time     Jarome Matin, RD, LDN Inpatient Clinical Dietitian Pager # [phone] After hours/weekend pager # [phone]

## 2015-06-13 LAB — CBC
HEMATOCRIT: 26.6 % — AB (ref 39.0–52.0)
HEMOGLOBIN: 8.4 g/dL — AB (ref 13.0–17.0)
MCH: 28.7 pg (ref 26.0–34.0)
MCHC: 31.6 g/dL (ref 30.0–36.0)
MCV: 90.8 fL (ref 78.0–100.0)
Platelets: 374 10*3/uL (ref 150–400)
RBC: 2.93 MIL/uL — AB (ref 4.22–5.81)
RDW: 16.5 % — ABNORMAL HIGH (ref 11.5–15.5)
WBC: 6.4 10*3/uL (ref 4.0–10.5)

## 2015-06-13 LAB — GLUCOSE, CAPILLARY
GLUCOSE-CAPILLARY: 161 mg/dL — AB (ref 65–99)
Glucose-Capillary: 163 mg/dL — ABNORMAL HIGH (ref 65–99)
Glucose-Capillary: 180 mg/dL — ABNORMAL HIGH (ref 65–99)
Glucose-Capillary: 183 mg/dL — ABNORMAL HIGH (ref 65–99)
Glucose-Capillary: 200 mg/dL — ABNORMAL HIGH (ref 65–99)
Glucose-Capillary: 163 mg/dL — ABNORMAL HIGH (ref 65–99)

## 2015-06-13 LAB — BASIC METABOLIC PANEL
ANION GAP: 11 (ref 5–15)
BUN: 16 mg/dL (ref 6–20)
CHLORIDE: 112 mmol/L — AB (ref 101–111)
CO2: 16 mmol/L — AB (ref 22–32)
CREATININE: 1.46 mg/dL — AB (ref 0.61–1.24)
Calcium: 6.1 mg/dL — CL (ref 8.9–10.3)
GFR calc non Af Amer: 45 mL/min — ABNORMAL LOW (ref >60)
GFR, EST AFRICAN AMERICAN: 52 mL/min — AB (ref >60)
Glucose, Bld: 199 mg/dL — ABNORMAL HIGH (ref 65–99)
Potassium: 4.2 mmol/L (ref 3.5–5.1)
SODIUM: 139 mmol/L (ref 135–145)

## 2015-06-13 MED ORDER — LACTATED RINGERS IV BOLUS (SEPSIS)
500.0000 mL | Freq: Once | INTRAVENOUS | Status: AC
  Administered 2015-06-13: 500 mL via INTRAVENOUS

## 2015-06-13 MED ORDER — LACTATED RINGERS IV SOLN
INTRAVENOUS | Status: DC
  Administered 2015-06-13 – 2015-06-15 (×4): via INTRAVENOUS

## 2015-06-13 MED ORDER — SODIUM CHLORIDE 0.9 % IV BOLUS (SEPSIS): 500.0000 mL | Freq: Once | INTRAVENOUS | Status: DC

## 2015-06-13 MED ORDER — SODIUM CHLORIDE 0.9 % IV SOLN
2.0000 g | Freq: Once | INTRAVENOUS | Status: AC
  Administered 2015-06-13: 2 g via INTRAVENOUS
  Filled 2015-06-13: qty 20

## 2015-06-13 MED ORDER — SODIUM CHLORIDE 0.9 % IV SOLN
Freq: Once | INTRAVENOUS | Status: AC
  Administered 2015-06-13: 12:00:00 via INTRAVENOUS

## 2015-06-13 NOTE — Progress Notes (Signed)
Patient ID: Jeremy Jennings, male   DOB: October 13, 1935, 79 y.o.   MRN: [id number] 1 Day Post-Op  Subjective: Up out of bed to chair. Incision is sore but no severe pain or other complaints.  Objective: Vital signs in last 24 hours: Temp:  [97.3 F (36.3 C)-98 F (36.7 C)] 97.7 F (36.5 C) (12/03 0413) Pulse Rate:  [64-129] 124 (12/03 0413) Resp:  [11-20] 20 (12/03 0413) BP: (133-193)/(51-93) 150/51 mmHg (12/03 0413) SpO2:  [88 %-100 %] 100 % (12/03 0413) Last BM Date: 06/05/15  Intake/Output from previous day: 12/02 0701 - 12/03 0700 In: 6387.3 [I.V.:6387.3] Out: 1000 [Urine:400; Emesis/NG output:300; Blood:300] Intake/Output this shift:    General appearance: alert, cooperative and no distress Resp: clear to auscultation bilaterally GI: nondistended. Mild appropriate tenderness. Incision/Wound: dressing clean and dry  Lab Results:   Recent Labs  06/12/15 0532 06/13/15 0625  WBC 9.9 6.4  HGB 9.3* 8.4*  HCT 28.7* 26.6*  PLT 400 374   BMET  Recent Labs  06/12/15 0532 06/13/15 0625  NA 140 139  K 3.5 4.2  CL 110 112*  CO2 20* 16*  GLUCOSE 148* 199*  BUN 10 16  CREATININE 0.86 1.46*  CALCIUM 6.8* 6.1*     Studies/Results: Ct Abdomen Pelvis W Contrast  06/11/2015  ADDENDUM REPORT: 06/11/2015 12:31 ADDENDUM: The salient findings were discussed with Zenovia Jarred on 06/11/2015 at 12:31 pm. Electronically Signed   By: Nolon Nations M.D.   On: 06/11/2015 12:31  06/11/2015  CLINICAL DATA:  abdominal pain awoke him this am. Denies n/v/d or fevers. No BM since this past Sunday. History of prostate cancer. ^133mL OMNIPAQUE IOHEXOL 300 MG/ML SOLN EXAM: CT ABDOMEN AND PELVIS WITH CONTRAST TECHNIQUE: Multidetector CT imaging of the abdomen and pelvis was performed using the standard protocol following bolus administration of intravenous contrast. CONTRAST:  144mL OMNIPAQUE IOHEXOL 300 MG/ML  SOLN COMPARISON:  Lower chest: There bilateral pleural effusions. Bibasilar  atelectasis. Coronary artery calcifications are present. Heart size appears normal. Upper abdomen: No focal abnormality identified within the liver, spleen, pancreas, or adrenal glands. Gallbladder is present. Bilateral renal cysts are present. No intrarenal or ureteral stones. Gastrointestinal tract: There are numerous dilated proximal small bowel loops contain contrast. In the left central lower abdomen there is a non-opacified loop of small bowel with surrounding mesenteric fluid. Asleep is felt to be the point of obstruction likely represents a closed loop obstruction. The small bowel loops distal to this level are relatively collapsed. There are numerous colonic diverticula but no evidence for acute diverticulitis. The colon is redundant. The appendix is well seen and has a normal appearance. Pelvis: The prostate is enlarged. Urinary bladder otherwise has a normal appearance. No free pelvic fluid. No pelvic adenopathy. Retroperitoneum: Atherosclerotic calcification identified within the aorta. Abdominal wall: Fat containing umbilical hernia. Osseous structures: Interval progression of significant osseous metastatic disease.2 diffuse sclerotic metastases involve the pelvis, proximal femurs, and all of the visualized vertebrae. FINDINGS: 1. Findings consistent with closed loop obstruction of the mid to distal small bowel. Given the presence of surrounding mesenteric fluid, perforation should be considered. There is no free intraperitoneal air however. 2. Coronary artery disease. 3. Normal appendix. 4. Colonic diverticulosis. 5. Prostatic enlargement. 6. Umbilical hernia. 7. Progression of diffuse sclerotic metastases. Electronically Signed: By: Nolon Nations M.D. On: 06/11/2015 12:21   Portable Chest Xray - Atelectasis  06/12/2015  CLINICAL DATA:  Aspiration of vomitus, initial encounter. EXAM: PORTABLE CHEST 1 VIEW COMPARISON:  Nov 09, 2014.  FINDINGS: Stable cardiomediastinal silhouette. Hypoinflation of  the lungs is noted. Nasogastric tube tip is seen in proximal stomach. No pneumothorax is noted. Mild central pulmonary vascular congestion is noted. Small amount of fluid is noted in the right minor fissure. Left basilar opacity is noted concerning for pneumonia or atelectasis with associated pleural effusion. Bony thorax is unremarkable. IMPRESSION: Hypoinflation of the lungs is noted. Mild central pulmonary vascular congestion is noted. Left basilar opacity is noted concerning for pneumonia or atelectasis with associated pleural effusion. Electronically Signed   By: Marijo Conception, M.D.   On: 06/12/2015 16:43   Dg Abd Portable 1v  06/12/2015  CLINICAL DATA:  Nasogastric tube placement EXAM: PORTABLE ABDOMEN - 1 VIEW COMPARISON:  Yesterday FINDINGS: Advancement of nasogastric tube with tip in the peripatellar region. Unchanged dilation of small bowel. There is a new lucency over the epigastrium and right abdomen which may outline the falciform ligament and the outer wall of a small bowel loop. Critical Value/emergent results were called by telephone at the time of interpretation on 06/12/2015 at 8:07 am to Dr. Saverio Danker , who verbally acknowledged these results. IMPRESSION: 1. Possible new pneumoperitoneum. Depending on exam, confirmation could be obtained by CT or upright/decubitus imaging. 2. Advanced nasogastric tube with tip at the pylorus. 3. Unchanged small bowel distention. Electronically Signed   By: Monte Fantasia M.D.   On: 06/12/2015 08:08   Dg Abd Portable 1v-small Bowel Obstruction Protocol-initial, 8 Hr Delay  06/11/2015  CLINICAL DATA:  NG tube placement EXAM: PORTABLE ABDOMEN - 1 VIEW COMPARISON:  CT of the abdomen and pelvis from the same day. FINDINGS: Side port of an NG tube is just beyond the GE junction. Lung bases are clear. Multiple dilated loops of small bowel are again seen. IMPRESSION: 1. The side port of the NG tube is just beyond the GE junction and could be advanced several  cm for more optimal positioning. 2. Persistent dilation of multiple loops of small bowel. Electronically Signed   By: San Morelle M.D.   On: 06/11/2015 17:17    Anti-infectives: Anti-infectives    Start     Dose/Rate Route Frequency Ordered Stop   06/12/15 0930  cefoTEtan (CEFOTAN) 2 g in dextrose 5 % 50 mL IVPB     2 g 100 mL/hr over 30 Minutes Intravenous On call to O.R. 06/12/15 0852 06/12/15 1359      Assessment/Plan: s/p Procedure(s): EXPLORATORY LAPAROTOMY SMALL BOWEL RESECTION Overall stable postoperatively. Urine output marginal and slightly elevated creatinine. We will give fluid bolus and monitor urine output and lab. Postoperative anemia with background of chronic anemia. Likely will decrease further with IV fluids.Plan to transfuse 1 unit PRBCs Hypocalcemia-Calcium gluconate ordered and repeat labs in a.m. hyperglycemia-SSI   LOS: 2 days    Ailene Royal T 06/13/2015

## 2015-06-14 LAB — BASIC METABOLIC PANEL
Anion gap: 7 (ref 5–15)
BUN: 18 mg/dL (ref 6–20)
CHLORIDE: 114 mmol/L — AB (ref 101–111)
CO2: 21 mmol/L — AB (ref 22–32)
Calcium: 6.4 mg/dL — CL (ref 8.9–10.3)
Creatinine, Ser: 1.11 mg/dL (ref 0.61–1.24)
GFR calc non Af Amer: >60 mL/min (ref >60)
Glucose, Bld: 179 mg/dL — ABNORMAL HIGH (ref 65–99)
POTASSIUM: 3.9 mmol/L (ref 3.5–5.1)
SODIUM: 142 mmol/L (ref 135–145)

## 2015-06-14 LAB — CBC
HEMATOCRIT: 27.4 % — AB (ref 39.0–52.0)
HEMOGLOBIN: 9 g/dL — AB (ref 13.0–17.0)
MCH: 29.2 pg (ref 26.0–34.0)
MCHC: 32.8 g/dL (ref 30.0–36.0)
MCV: 89 fL (ref 78.0–100.0)
Platelets: 322 10*3/uL (ref 150–400)
RBC: 3.08 MIL/uL — AB (ref 4.22–5.81)
RDW: 16.4 % — ABNORMAL HIGH (ref 11.5–15.5)
WBC: 7.8 10*3/uL (ref 4.0–10.5)

## 2015-06-14 LAB — GLUCOSE, CAPILLARY
GLUCOSE-CAPILLARY: 113 mg/dL — AB (ref 65–99)
GLUCOSE-CAPILLARY: 137 mg/dL — AB (ref 65–99)
GLUCOSE-CAPILLARY: 140 mg/dL — AB (ref 65–99)
GLUCOSE-CAPILLARY: 165 mg/dL — AB (ref 65–99)
Glucose-Capillary: 169 mg/dL — ABNORMAL HIGH (ref 65–99)

## 2015-06-14 MED ORDER — PHENOL 1.4 % MT LIQD
1.0000 | OROMUCOSAL | Status: DC | PRN
  Filled 2015-06-14 (×2): qty 177

## 2015-06-14 MED ORDER — ALBUTEROL SULFATE (2.5 MG/3ML) 0.083% IN NEBU
2.5000 mg | INHALATION_SOLUTION | Freq: Four times a day (QID) | RESPIRATORY_TRACT | Status: DC | PRN
  Administered 2015-06-19: 2.5 mg via RESPIRATORY_TRACT
  Filled 2015-06-14 (×2): qty 3

## 2015-06-14 MED ORDER — SODIUM CHLORIDE 0.9 % IV SOLN
1.0000 g | Freq: Once | INTRAVENOUS | Status: AC
  Administered 2015-06-14: 1 g via INTRAVENOUS
  Filled 2015-06-14: qty 10

## 2015-06-14 MED ORDER — INSULIN GLARGINE 100 UNIT/ML ~~LOC~~ SOLN
10.0000 [IU] | Freq: Every day | SUBCUTANEOUS | Status: DC
  Administered 2015-06-14 – 2015-06-19 (×6): 10 [IU] via SUBCUTANEOUS
  Filled 2015-06-14 (×6): qty 0.1

## 2015-06-14 MED ORDER — SODIUM CHLORIDE 0.9 % IV SOLN
2.0000 g | Freq: Once | INTRAVENOUS | Status: AC
  Administered 2015-06-14: 2 g via INTRAVENOUS
  Filled 2015-06-14: qty 20

## 2015-06-14 NOTE — Progress Notes (Signed)
Patient ID: Jeremy Jennings, male   DOB: Jul 03, 1936, 79 y.o.   MRN: [id number] 2 Days Post-Op  Subjective: No specific complaints this morning. Denies abdominal pain.No flatus or bowel movements yet. Has been out of bed.  Objective: Vital signs in last 24 hours: Temp:  [98.3 F (36.8 C)-101.2 F (38.4 C)] 98.6 F (37 C) (12/04 0605) Pulse Rate:  [121-132] 122 (12/04 0605) Resp:  [18-20] 20 (12/04 0605) BP: (136-164)/(22-79) 162/79 mmHg (12/04 0605) SpO2:  [97 %-99 %] 98 % (12/04 0605) Last BM Date: 06/05/15  Intake/Output from previous day: 12/03 0701 - 12/04 0700 In: 550 [I.V.:250; Blood:300] Out: 2050 [Urine:850; Emesis/NG output:1200] Intake/Output this shift:    General appearance: alert, cooperative and no distress Resp: clear to auscultation bilaterally GI: normal findings: soft, non-tender Incision/Wound: no erythema or drainage  Lab Results:   Recent Labs  06/13/15 0625 06/14/15 0520  WBC 6.4 7.8  HGB 8.4* 9.0*  HCT 26.6* 27.4*  PLT 374 322   BMET  Recent Labs  06/13/15 0625 06/14/15 0520  NA 139 142  K 4.2 3.9  CL 112* 114*  CO2 16* 21*  GLUCOSE 199* 179*  BUN 16 18  CREATININE 1.46* 1.11  CALCIUM 6.1* 6.4*     Studies/Results: Portable Chest Xray - Atelectasis  06/12/2015  CLINICAL DATA:  Aspiration of vomitus, initial encounter. EXAM: PORTABLE CHEST 1 VIEW COMPARISON:  Nov 09, 2014. FINDINGS: Stable cardiomediastinal silhouette. Hypoinflation of the lungs is noted. Nasogastric tube tip is seen in proximal stomach. No pneumothorax is noted. Mild central pulmonary vascular congestion is noted. Small amount of fluid is noted in the right minor fissure. Left basilar opacity is noted concerning for pneumonia or atelectasis with associated pleural effusion. Bony thorax is unremarkable. IMPRESSION: Hypoinflation of the lungs is noted. Mild central pulmonary vascular congestion is noted. Left basilar opacity is noted concerning for pneumonia or atelectasis  with associated pleural effusion. Electronically Signed   By: Marijo Conception, M.D.   On: 06/12/2015 16:43    Anti-infectives: Anti-infectives    Start     Dose/Rate Route Frequency Ordered Stop   06/12/15 0930  cefoTEtan (CEFOTAN) 2 g in dextrose 5 % 50 mL IVPB     2 g 100 mL/hr over 30 Minutes Intravenous On call to O.R. 06/12/15 0852 06/12/15 1359      Assessment/Plan: s/p Procedure(s): EXPLORATORY LAPAROTOMY SMALL BOWEL RESECTION Still with expected ileus. Continue NG and IV fluids. Hypocalcemia. Some improvement with calcium gluconate yesterday. Repeat 2 g today. Diabetes mellitus. Blood sugars running a little high. Add Lantus. Elevated creatinine-resolved, urine output improved, leave Foley today to monitor urine output and discontinue in a.m.   LOS: 3 days    Jeremy Jennings 06/14/2015

## 2015-06-15 ENCOUNTER — Encounter (HOSPITAL_COMMUNITY): Payer: Self-pay | Admitting: General Surgery

## 2015-06-15 ENCOUNTER — Inpatient Hospital Stay (HOSPITAL_COMMUNITY): Payer: Medicare Other

## 2015-06-15 ENCOUNTER — Telehealth: Payer: Self-pay | Admitting: *Deleted

## 2015-06-15 LAB — GLUCOSE, CAPILLARY
GLUCOSE-CAPILLARY: 104 mg/dL — AB (ref 65–99)
GLUCOSE-CAPILLARY: 113 mg/dL — AB (ref 65–99)
GLUCOSE-CAPILLARY: 115 mg/dL — AB (ref 65–99)
GLUCOSE-CAPILLARY: 116 mg/dL — AB (ref 65–99)
GLUCOSE-CAPILLARY: 122 mg/dL — AB (ref 65–99)
GLUCOSE-CAPILLARY: 123 mg/dL — AB (ref 65–99)
Glucose-Capillary: 112 mg/dL — ABNORMAL HIGH (ref 65–99)

## 2015-06-15 LAB — CBC
HCT: 27.2 % — ABNORMAL LOW (ref 39.0–52.0)
Hemoglobin: 8.8 g/dL — ABNORMAL LOW (ref 13.0–17.0)
MCH: 28.8 pg (ref 26.0–34.0)
MCHC: 32.4 g/dL (ref 30.0–36.0)
MCV: 88.9 fL (ref 78.0–100.0)
PLATELETS: 337 10*3/uL (ref 150–400)
RBC: 3.06 MIL/uL — AB (ref 4.22–5.81)
RDW: 16.4 % — AB (ref 11.5–15.5)
WBC: 6.2 10*3/uL (ref 4.0–10.5)

## 2015-06-15 LAB — BASIC METABOLIC PANEL
Anion gap: 10 (ref 5–15)
BUN: 15 mg/dL (ref 6–20)
CHLORIDE: 115 mmol/L — AB (ref 101–111)
CO2: 20 mmol/L — ABNORMAL LOW (ref 22–32)
CREATININE: 0.9 mg/dL (ref 0.61–1.24)
Calcium: 6.9 mg/dL — ABNORMAL LOW (ref 8.9–10.3)
Glucose, Bld: 118 mg/dL — ABNORMAL HIGH (ref 65–99)
Potassium: 3.4 mmol/L — ABNORMAL LOW (ref 3.5–5.1)
SODIUM: 145 mmol/L (ref 135–145)

## 2015-06-15 LAB — TYPE AND SCREEN
ABO/RH(D): B POS
Antibody Screen: NEGATIVE
UNIT DIVISION: 0
UNIT DIVISION: 0

## 2015-06-15 LAB — MAGNESIUM: Magnesium: 1.6 mg/dL — ABNORMAL LOW (ref 1.7–2.4)

## 2015-06-15 MED ORDER — METOPROLOL TARTRATE 1 MG/ML IV SOLN
5.0000 mg | Freq: Four times a day (QID) | INTRAVENOUS | Status: DC
  Administered 2015-06-15 – 2015-06-19 (×17): 5 mg via INTRAVENOUS
  Filled 2015-06-15 (×17): qty 5

## 2015-06-15 MED ORDER — MAGNESIUM SULFATE 2 GM/50ML IV SOLN
2.0000 g | Freq: Once | INTRAVENOUS | Status: AC
  Administered 2015-06-15: 2 g via INTRAVENOUS
  Filled 2015-06-15: qty 50

## 2015-06-15 MED ORDER — MENTHOL 3 MG MT LOZG
1.0000 | LOZENGE | OROMUCOSAL | Status: DC | PRN
  Administered 2015-06-15: 3 mg via ORAL
  Filled 2015-06-15 (×2): qty 9

## 2015-06-15 MED ORDER — HEPARIN SODIUM (PORCINE) 5000 UNIT/ML IJ SOLN
5000.0000 [IU] | Freq: Three times a day (TID) | INTRAMUSCULAR | Status: DC
  Administered 2015-06-15 – 2015-07-03 (×54): 5000 [IU] via SUBCUTANEOUS
  Filled 2015-06-15 (×53): qty 1

## 2015-06-15 MED ORDER — KCL IN DEXTROSE-NACL 40-5-0.9 MEQ/L-%-% IV SOLN
INTRAVENOUS | Status: AC
  Administered 2015-06-15: 75 mL via INTRAVENOUS
  Administered 2015-06-16: 03:00:00 via INTRAVENOUS
  Filled 2015-06-15 (×3): qty 1000

## 2015-06-15 NOTE — Telephone Encounter (Signed)
Voicemail: "This is [name] calling for my Dad Jeremy Jennings to notify you he is admitted to room 1424 after surgery to his back.  He has an appointment today and we need a call [phone]."

## 2015-06-15 NOTE — Evaluation (Signed)
Physical Therapy Evaluation Patient Details Name: Jeremy Jennings MRN: [id number] DOB: [birth year] Today's Date: 06/15/2015   History of Present Illness  79 yo black male who has a history of stage 4 prostate cancer, HTN, DM, tachycardia, recent GI bleed. He had an endoscopy at that time of admission but apparently deferred an inpatient colonoscopy. He just had his colonoscopy done by Dr. Watt Climes on Tuesday. By report, this was normal with no source of bleeding identified. He denies any further bloody BMs since this admission. Pt admitted with centralized crampy abdominal pain. He denies nausea or vomiting, but admits to no flatus. He deals with constipation on a regular basis. Due to persistent pain, the patient presented to [facility] for further evaluation.Dx of SBO, s/p resection 06/12/15.    Clinical Impression  Pt admitted with above diagnosis. Pt currently with functional limitations due to the deficits listed below (see PT Problem List). Pt ambulated 180' with RW and min/guard assist, distance limited by fatigue/SOB. SaO2 100% on RA walking.  Pt will benefit from skilled PT to increase their independence and safety with mobility to allow discharge to the venue listed below.       Follow Up Recommendations Home health PT;Supervision/Assistance - 24 hour    Equipment Recommendations  Kasandra Knudsen (pt states he will pick up his own)    Recommendations for Other Services       Precautions / Restrictions Precautions Precautions: Fall Restrictions Weight Bearing Restrictions: No      Mobility  Bed Mobility Overal bed mobility: Needs Assistance Bed Mobility: Rolling;Sidelying to Sit Rolling: Min assist Sidelying to sit: Mod assist       General bed mobility comments: mod A to raise trunk  Transfers Overall transfer level: Needs assistance Equipment used: Rolling walker (2 wheeled) Transfers: Sit to/from Stand Sit to Stand: Min assist;From elevated surface         General  transfer comment: MIn A to rise  Ambulation/Gait Ambulation/Gait assistance: Min guard Ambulation Distance (Feet): 180 Feet Assistive device: Rolling walker (2 wheeled) Gait Pattern/deviations: Step-through pattern   Gait velocity interpretation: Below normal speed for age/gender General Gait Details: 3/4 dyspnea with walking, SaO2 100%, no LOB  Stairs            Wheelchair Mobility    Modified Rankin (Stroke Patients Only)       Balance             Standing balance-Leahy Scale: Fair                               Pertinent Vitals/Pain Pain Assessment: 0-10 Pain Score: 6  Pain Location: incision  Pain Descriptors / Indicators: Sore Pain Intervention(s): Limited activity within patient's tolerance;Monitored during session;Patient requesting pain meds-RN notified    Home Living Family/patient expects to be discharged to:: Private residence Living Arrangements: Alone Available Help at Discharge: Family;Available PRN/intermittently (daughter to stay with pt until Thanksgiving Day) Type of Home: House Home Access: Stairs to enter   [employer] of Steps: 2 Home Layout: One level Home Equipment: Crutches;Cane - single point;Walker - 2 wheels;Bedside commode      Prior Function Level of Independence: Independent         Comments: occaissionally uses crutch due to L knee arthritis pain     Hand Dominance        Extremity/Trunk Assessment   Upper Extremity Assessment: Generalized weakness  Lower Extremity Assessment: Generalized weakness      Cervical / Trunk Assessment: Normal  Communication   Communication: No difficulties  Cognition Arousal/Alertness: Awake/alert Behavior During Therapy: WFL for tasks assessed/performed Overall Cognitive Status: Within Functional Limits for tasks assessed                      General Comments      Exercises        Assessment/Plan    PT Assessment  Patient needs continued PT services  PT Diagnosis Difficulty walking;Generalized weakness   PT Problem List Decreased strength;Decreased activity tolerance;Decreased balance;Decreased mobility;Decreased knowledge of use of DME  PT Treatment Interventions DME instruction;Gait training;Functional mobility training;Therapeutic activities;Patient/family education;Balance training;Therapeutic exercise   PT Goals (Current goals can be found in the Care Plan section) Acute Rehab PT Goals Patient Stated Goal: likes to fish and garden PT Goal Formulation: With patient/family Time For Goal Achievement: 06/29/15 Potential to Achieve Goals: Good    Frequency Min 3X/week   Barriers to discharge        Co-evaluation               End of Session Equipment Utilized During Treatment: Gait belt Activity Tolerance: Patient limited by fatigue Patient left: with call bell/phone within reach;with family/visitor present;in chair (sitting EOB)           Time: [id number] PT Time Calculation (min) (ACUTE ONLY): 25 min   Charges:   PT Evaluation $Initial PT Evaluation Tier I: 1 Procedure PT Treatments $Gait Training: 8-22 mins   PT G Codes:        Philomena Doheny 06/15/2015, 3:14 PM [phone]

## 2015-06-15 NOTE — Progress Notes (Signed)
3 Days Post-Op  Subjective: It sounds like he walked once yesterday.  He is widowed and lives alone.  No flatus so far.  Not moving much air in the bases.    Objective: Vital signs in last 24 hours: Temp:  [97.5 F (36.4 C)-98.9 F (37.2 C)] 97.9 F (36.6 C) (12/05 0530) Pulse Rate:  [78-124] 78 (12/05 0530) Resp:  [18-20] 18 (12/05 0530) BP: (155-168)/(76-87) 163/81 mmHg (12/05 0530) SpO2:  [93 %-100 %] 100 % (12/05 0530) Last BM Date: 06/05/15 1500 from the NG recorded urine 1025 Fluid balance positive 7800 ml Diet:  NPO Afebrile, x 24 hours, BP up some, HR 70's-80's, telemetry showing higher rates 110-120's range. K+ 3.4 H/H drifting down some Film:  . Multi segment left basilar atelectasis. 2. Pulmonary venous congestion and trace effusions. 3. Shorter nasogastric tube with tip at the proximal stomach. Intake/Output from previous day: 12/04 0701 - 12/05 0700 In: 5687.5 [I.V.:5687.5] Out: 2525 [Urine:1025; Emesis/NG output:1500] Intake/Output this shift: Total I/O In: -  Out: 300 [Emesis/NG output:300]  General appearance: alert, cooperative and no distress Resp: BS down in bases, few rales,  GI: soft, wound is OK, rare high pitched bowel sounds.  No Flatus or BM.  Lab Results:   Recent Labs  06/14/15 0520 06/15/15 0500  WBC 7.8 6.2  HGB 9.0* 8.8*  HCT 27.4* 27.2*  PLT 322 337    BMET  Recent Labs  06/14/15 0520 06/15/15 0500  NA 142 145  K 3.9 3.4*  CL 114* 115*  CO2 21* 20*  GLUCOSE 179* 118*  BUN 18 15  CREATININE 1.11 0.90  CALCIUM 6.4* 6.9*   PT/INR No results for input(s): LABPROT, INR in the last 72 hours.   Recent Labs Lab 06/11/15 1022  AST 21  ALT 16*  ALKPHOS 435*  BILITOT 0.8  PROT 6.5  ALBUMIN 2.5*     Lipase     Component Value Date/Time   LIPASE 46 11/09/2014 1137     Studies/Results: Dg Chest 2 View  06/15/2015  CLINICAL DATA:  Wheezing on expiration EXAM: CHEST  2 VIEW COMPARISON:  Three days ago FINDINGS:  Improved lung volumes in general, but still with bandlike opacity over the left base consistent with atelectasis. The left diaphragm is elevated. Trace pleural effusions. There is also diffuse interstitial coarsening. Chronic cardiomegaly. Stable aortic and hilar contours. Nasogastric tube tip at the proximal stomach. IMPRESSION: 1. Multi segment left basilar atelectasis. 2. Pulmonary venous congestion and trace effusions. 3. Shorter nasogastric tube with tip at the proximal stomach. Electronically Signed   By: Monte Fantasia M.D.   On: 06/15/2015 07:29    Medications: . sodium chloride  10 mL/hr Intravenous Once  . insulin aspart  0-9 Units Subcutaneous TID WC  . insulin glargine  10 Units Subcutaneous Q2200  . metoprolol  2.5 mg Intravenous 4 times per day  . pantoprazole (PROTONIX) IV  40 mg Intravenous QHS  . prednisoLONE acetate  1 drop Both Eyes BID   . lactated ringers 125 mL/hr at 06/15/15 8101   Prior to Admission medications   Medication Sig Start Date End Date Taking? Authorizing Provider  abiraterone Acetate (ZYTIGA) 250 MG tablet Take 4 tablets (1,000 mg total) by mouth daily. Take on an empty stomach 1 hour before or 2 hours after a meal 05/22/15  Yes Wyatt Portela, MD  ACCU-CHEK SOFTCLIX LANCETS lancets Use as instructed to check blood sugar 2 times per day dx code E11.65 10/23/14  Yes  Elayne Snare, MD  acetaminophen (TYLENOL) 650 MG CR tablet Take 1 tablet (650 mg total) by mouth every 8 (eight) hours as needed for pain. 05/30/15  Yes Florencia Reasons, MD  atorvastatin (LIPITOR) 10 MG tablet take 1 tablet by mouth once daily 07/09/14  Yes Darlin Coco, MD  Blood Glucose Monitoring Suppl (ACCU-CHEK AVIVA PLUS) W/DEVICE KIT Use to check blood sugar 2 times per day dx code E11.65 10/23/14  Yes Elayne Snare, MD  carvedilol (COREG) 12.5 MG tablet Take 1 tablet (12.5 mg total) by mouth 2 (two) times daily with a meal. 08/19/14  Yes Darlin Coco, MD  Cholecalciferol (VITAMIN D PO) Take 5,000  Units by mouth daily. Taking 5000 daily   Yes Historical Provider, MD  COMBIGAN 0.2-0.5 % ophthalmic solution Place 1 drop into the right eye daily.  04/09/13  Yes Historical Provider, MD  Cyanocobalamin (VITAMIN B-12 PO) Take 1 tablet by mouth daily.   Yes Historical Provider, MD  diltiazem (TIAZAC) 360 MG 24 hr capsule Take 360 mg by mouth daily.     Yes Historical Provider, MD  docusate sodium (COLACE) 100 MG capsule Take 100 mg by mouth daily.    Yes Historical Provider, MD  glucose blood (ACCU-CHEK AVIVA PLUS) test strip Use as instructed to check blood sugar 2 times per day dx code E11.65 10/23/14  Yes Elayne Snare, MD  guaiFENesin (ROBITUSSIN) 100 MG/5ML liquid Take 200 mg by mouth 3 (three) times daily as needed for cough.   Yes Historical Provider, MD  insulin aspart (NOVOLOG FLEXPEN) 100 UNIT/ML FlexPen Inject 12 units 3 times per day with meals 12/05/14  Yes Elayne Snare, MD  Insulin Detemir (LEVEMIR) 100 UNIT/ML Pen Inject 12 Units into the skin daily at 10 pm.   Yes Historical Provider, MD  Insulin Pen Needle (B-D UF III MINI PEN NEEDLES) 31G X 5 MM MISC 1 each by Does not apply route 2 (two) times daily. 04/16/14  Yes Elayne Snare, MD  Liraglutide 18 MG/3ML SOPN Inject 1.8 mg into the skin daily. 06/09/14  Yes Elayne Snare, MD  megestrol (MEGACE) 400 MG/10ML suspension Take 10 mLs (400 mg total) by mouth 2 (two) times daily. 05/18/15  Yes Wyatt Portela, MD  oxyCODONE-acetaminophen (PERCOCET/ROXICET) 5-325 MG tablet Take 1 tablet by mouth every 4 (four) hours as needed for moderate pain or severe pain. 05/30/15  Yes Florencia Reasons, MD  pioglitazone (ACTOS) 30 MG tablet Take 1 tablet (30 mg total) by mouth daily. 01/19/15  Yes Elayne Snare, MD  prednisoLONE acetate (PRED FORTE) 1 % ophthalmic suspension Place 1 drop into both eyes 2 (two) times daily.  04/25/13  Yes Historical Provider, MD  tamsulosin (FLOMAX) 0.4 MG CAPS capsule Take 0.4 mg by mouth daily.  08/02/13  Yes Historical Provider, MD      Assessment/Plan Small bowel obstruction with internal hernia S/p exploratory laparotomy with SBO, 06/12/15, Dr. [name] Hx of GI bleed 11/16-11/19/16  ([facility]) Post op anemia, transfused Tachycardia - On Coreg and Diltiazem at home per list above/discharge 05/30/15  (mostly sinus with PVC's) AODM  Insulin dependant Prostate cancer ongoing treatment Hypertension Hyperlipidemia  Antibiotics:  None DVT:  SCD's only   Plan: Increase BB, he needs to be walking, IS q1h, he has 2 at the bedside.  Continue NG,  and DVT dose heparin for DVT prophylaxis diabetes coordinator to help with glucose control.  Decrease IV fluids, replace K+.   He only has a peripherals IV, so I am just going to  put it in the main IV. I did attempt to call the daughter, but got voice mail.  Magnesium is also low and I have ordered replacement for this.  Will recheck in AM.   LOS: 4 days    Tanaiya Kolarik 06/15/2015

## 2015-06-15 NOTE — Telephone Encounter (Signed)
Spoke with veronica, note placed on dr [facility].

## 2015-06-15 NOTE — Progress Notes (Signed)
Inpatient Diabetes Program Recommendations  AACE/ADA: New Consensus Statement on Inpatient Glycemic Control (2015)  Target Ranges:  Prepandial:   less than 140 mg/dL      Peak postprandial:   less than 180 mg/dL (1-2 hours)      Critically ill patients:  140 - 180 mg/dL   Results for Jeremy Jennings, Jeremy Jennings (MRN [id number]) as of 06/15/2015 11:26  Ref. Range 06/13/2015 00:02 06/13/2015 04:10 06/13/2015 07:53 06/13/2015 11:30 06/13/2015 17:01 06/13/2015 20:51  Glucose-Capillary Latest Ref Range: 65-99 mg/dL 161 (H) 163 (H) 180 (H) 183 (H) 200 (H) 163 (H)   Results for Jeremy Jennings, Jeremy Jennings (MRN [id number]) as of 06/15/2015 11:26  Ref. Range 06/14/2015 00:08 06/14/2015 06:23 06/14/2015 07:47 06/14/2015 12:01 06/14/2015 16:32 06/14/2015 20:19  Glucose-Capillary Latest Ref Range: 65-99 mg/dL 140 (H) 165 (H) 169 (H) 113 (H) 137 (H) 104 (H)   Results for Jeremy Jennings, Jeremy Jennings (MRN [id number]) as of 06/15/2015 11:26  Ref. Range 06/15/2015 00:42 06/15/2015 05:09 06/15/2015 08:00  Glucose-Capillary Latest Ref Range: 65-99 mg/dL 115 (H) 116 (H) 123 (H)    Admit with: SBO  History: DM, HTN, Prostate Cancer with Mets to Bone  Home DM Meds: Levemir 12 units QHS       Novolog 12 units tidwc       Actos 30 mg daily       Victoza 1.8 mg daily  Current Insulin Orders: Lantus 10 units QHS      Novolog Sensitive SSI (0-9 units) TID AC     -Note Lantus 10 units QHS started last PM.  CBGs so far today stable.    -Note that patient remains NPO.  IVF switched to D5 NS at 75cc/hour today.    MD- Please consider the following in-hospital insulin adjustments for now:  1. Change Lantus 10 units QHS to Levemir 10 units QHS (patient takes Levemir at home- would leave dose at 10 units QHS for now)  2. Change Novolog Sensitive SSI to Q4 hour coverage if you anticipate patient will have prolonged NPO status (currently ordered TID AC)   -Will follow patient during hospitalization and will be happy to assist with glucose  control.    Wyn Quaker RN, MSN, CDE Diabetes Coordinator Inpatient Glycemic Control Team Team Pager: [phone] (8a-5p)

## 2015-06-15 NOTE — Care Management Important Message (Signed)
Important Message  Patient Details  Name: DOMONIQUE LASOTA MRN: [id number] Date of Birth: [birth year]   Medicare Important Message Given:  Yes    Shelda Altes 06/15/2015, 1:16 PMImportant Message  Patient Details  Name: DONIE ESTIS MRN: [id number] Date of Birth: March 29, 1936   Medicare Important Message Given:  Yes    Shelda Altes 06/15/2015, 1:16 PM

## 2015-06-15 NOTE — Progress Notes (Signed)
This encounter was created in error - please disregard.

## 2015-06-16 ENCOUNTER — Encounter: Payer: Medicare Other | Admitting: Physician Assistant

## 2015-06-16 ENCOUNTER — Other Ambulatory Visit: Payer: Medicare Other

## 2015-06-16 ENCOUNTER — Ambulatory Visit: Payer: Medicare Other | Admitting: Oncology

## 2015-06-16 LAB — COMPREHENSIVE METABOLIC PANEL
ALK PHOS: 290 U/L — AB (ref 38–126)
ALT: 13 U/L — AB (ref 17–63)
AST: 19 U/L (ref 15–41)
Albumin: 1.8 g/dL — ABNORMAL LOW (ref 3.5–5.0)
Anion gap: 7 (ref 5–15)
BUN: 12 mg/dL (ref 6–20)
CALCIUM: 6.6 mg/dL — AB (ref 8.9–10.3)
CHLORIDE: 116 mmol/L — AB (ref 101–111)
CO2: 20 mmol/L — AB (ref 22–32)
CREATININE: 0.8 mg/dL (ref 0.61–1.24)
GFR calc Af Amer: >60 mL/min (ref >60)
GFR calc non Af Amer: >60 mL/min (ref >60)
Glucose, Bld: 142 mg/dL — ABNORMAL HIGH (ref 65–99)
Potassium: 3.5 mmol/L (ref 3.5–5.1)
SODIUM: 143 mmol/L (ref 135–145)
Total Bilirubin: 0.6 mg/dL (ref 0.3–1.2)
Total Protein: 5.2 g/dL — ABNORMAL LOW (ref 6.5–8.1)

## 2015-06-16 LAB — GLUCOSE, CAPILLARY
GLUCOSE-CAPILLARY: 124 mg/dL — AB (ref 65–99)
GLUCOSE-CAPILLARY: 136 mg/dL — AB (ref 65–99)
Glucose-Capillary: 108 mg/dL — ABNORMAL HIGH (ref 65–99)
Glucose-Capillary: 121 mg/dL — ABNORMAL HIGH (ref 65–99)
Glucose-Capillary: 128 mg/dL — ABNORMAL HIGH (ref 65–99)
Glucose-Capillary: 144 mg/dL — ABNORMAL HIGH (ref 65–99)

## 2015-06-16 LAB — CBC
HCT: 26.9 % — ABNORMAL LOW (ref 39.0–52.0)
HEMOGLOBIN: 8.6 g/dL — AB (ref 13.0–17.0)
MCH: 28.4 pg (ref 26.0–34.0)
MCHC: 32 g/dL (ref 30.0–36.0)
MCV: 88.8 fL (ref 78.0–100.0)
PLATELETS: 344 10*3/uL (ref 150–400)
RBC: 3.03 MIL/uL — AB (ref 4.22–5.81)
RDW: 16.6 % — ABNORMAL HIGH (ref 11.5–15.5)
WBC: 5 10*3/uL (ref 4.0–10.5)

## 2015-06-16 LAB — PREALBUMIN: PREALBUMIN: 3.5 mg/dL — AB (ref 18–38)

## 2015-06-16 LAB — MAGNESIUM: Magnesium: 2 mg/dL (ref 1.7–2.4)

## 2015-06-16 MED ORDER — POTASSIUM CHLORIDE 10 MEQ/50ML IV SOLN
10.0000 meq | INTRAVENOUS | Status: AC
  Administered 2015-06-16 (×3): 10 meq via INTRAVENOUS
  Filled 2015-06-16 (×3): qty 50

## 2015-06-16 MED ORDER — TRACE MINERALS CR-CU-MN-SE-ZN 10-1000-500-60 MCG/ML IV SOLN
INTRAVENOUS | Status: AC
  Administered 2015-06-16: 18:00:00 via INTRAVENOUS
  Filled 2015-06-16: qty 960

## 2015-06-16 MED ORDER — SODIUM CHLORIDE 0.9 % IJ SOLN
10.0000 mL | INTRAMUSCULAR | Status: DC | PRN
  Administered 2015-06-17 – 2015-07-03 (×3): 10 mL
  Filled 2015-06-16 (×3): qty 40

## 2015-06-16 MED ORDER — KCL IN DEXTROSE-NACL 40-5-0.9 MEQ/L-%-% IV SOLN
INTRAVENOUS | Status: DC
  Administered 2015-06-16: 18:00:00 via INTRAVENOUS
  Filled 2015-06-16: qty 1000

## 2015-06-16 MED ORDER — INSULIN ASPART 100 UNIT/ML ~~LOC~~ SOLN
0.0000 [IU] | SUBCUTANEOUS | Status: DC
  Administered 2015-06-16 – 2015-06-17 (×3): 1 [IU] via SUBCUTANEOUS
  Administered 2015-06-17: 2 [IU] via SUBCUTANEOUS
  Administered 2015-06-17: 1 [IU] via SUBCUTANEOUS

## 2015-06-16 MED ORDER — CALCIUM GLUCONATE 10 % IV SOLN
1.0000 g | Freq: Once | INTRAVENOUS | Status: AC
  Administered 2015-06-16: 1 g via INTRAVENOUS
  Filled 2015-06-16: qty 10

## 2015-06-16 MED ORDER — FAT EMULSION 20 % IV EMUL
250.0000 mL | INTRAVENOUS | Status: AC
  Administered 2015-06-16: 250 mL via INTRAVENOUS
  Filled 2015-06-16: qty 250

## 2015-06-16 NOTE — Progress Notes (Signed)
OT Cancellation Note  Patient Details Name: QUANELL WINDOM MRN: [id number] DOB: 05-Apr-1936   Cancelled Treatment:    Reason Eval/Treat Not Completed: Other (comment).  Pt's visitor just arrived and he asked if I can come back another time. Will check back later today or tomorrow as schedule permits.  Osmara Drummonds 06/16/2015, 1:26 PM  Lesle Chris, OTR/L [phone] 06/16/2015

## 2015-06-16 NOTE — Progress Notes (Signed)
OT Cancellation Note  Patient Details Name: Jeremy Jennings MRN: [id number] DOB: 1936-07-01   Cancelled Treatment:    Reason Eval/Treat Not Completed: Patient at procedure or test/ unavailable.  Pt getting PICC. Will return tomorrow.  [place] 06/16/2015, 3:50 PM  Lesle Chris, OTR/L [phone] 06/16/2015

## 2015-06-16 NOTE — Progress Notes (Signed)
4 Days Post-Op  Subjective: No real change, he is taking allot of ice and it's not being recorded.  What is in the cannister looks like NG drainage.  He is still distended some, not much in the way of bowel sounds.  He reports walking x 1 yesterday.  PT says he needs ongoing therapy, with generalized weakness and occasionally uses a crutch for his arthritis left knee.  Objective: Vital signs in last 24 hours: Temp:  [97.7 F (36.5 C)-98 F (36.7 C)] 98 F (36.7 C) (12/06 0412) Pulse Rate:  [77-108] 77 (12/06 0412) Resp:  [20-22] 20 (12/06 0412) BP: (147-170)/(68-98) 170/82 mmHg (12/06 0412) SpO2:  [96 %-100 %] 96 % (12/06 0412) Last BM Date: 06/05/15 2550 from NG recorded 1398 IV recorded NPO Afebrile, BP and HR still up some,  Labs show his prealbumin is 3.5, otherwise stable Mag is up to 2.0 and KP is up to 3.5 Glucose OK Intake/Output from previous day: 12/05 0701 - 12/06 0700 In: 1398.8 [I.V.:1348.8; IV Piggyback:50] Out: [id number] [Urine:1225; Emesis/NG output:2550] Intake/Output this shift:    General appearance: alert, cooperative and no distress Resp: clear to auscultation bilaterally and anterior BS down some in bases GI: somewhat distended, it is a large abdomen, not much flatus, few hypoactive bowel sounds.    Lab Results:   Recent Labs  06/15/15 0500 06/16/15 0421  WBC 6.2 5.0  HGB 8.8* 8.6*  HCT 27.2* 26.9*  PLT 337 344    BMET  Recent Labs  06/15/15 0500 06/16/15 0421  NA 145 143  K 3.4* 3.5  CL 115* 116*  CO2 20* 20*  GLUCOSE 118* 142*  BUN 15 12  CREATININE 0.90 0.80  CALCIUM 6.9* 6.6*   PT/INR No results for input(s): LABPROT, INR in the last 72 hours.   Recent Labs Lab 06/11/15 1022 06/16/15 0421  AST 21 19  ALT 16* 13*  ALKPHOS 435* 290*  BILITOT 0.8 0.6  PROT 6.5 5.2*  ALBUMIN 2.5* 1.8*     Lipase     Component Value Date/Time   LIPASE 46 11/09/2014 1137     Studies/Results: Dg Chest 2 View  06/15/2015  CLINICAL DATA:   Wheezing on expiration EXAM: CHEST  2 VIEW COMPARISON:  Three days ago FINDINGS: Improved lung volumes in general, but still with bandlike opacity over the left base consistent with atelectasis. The left diaphragm is elevated. Trace pleural effusions. There is also diffuse interstitial coarsening. Chronic cardiomegaly. Stable aortic and hilar contours. Nasogastric tube tip at the proximal stomach. IMPRESSION: 1. Multi segment left basilar atelectasis. 2. Pulmonary venous congestion and trace effusions. 3. Shorter nasogastric tube with tip at the proximal stomach. Electronically Signed   By: Monte Fantasia M.D.   On: 06/15/2015 07:29    Medications: . sodium chloride  10 mL/hr Intravenous Once  . heparin subcutaneous  5,000 Units Subcutaneous 3 times per day  . insulin aspart  0-9 Units Subcutaneous TID WC  . insulin glargine  10 Units Subcutaneous Q2200  . metoprolol  5 mg Intravenous 4 times per day  . pantoprazole (PROTONIX) IV  40 mg Intravenous QHS  . prednisoLONE acetate  1 drop Both Eyes BID   . dextrose 5 % and 0.9 % NaCl with KCl 40 mEq/L 75 mL/hr at 06/16/15 [id number]    Assessment/Plan Small bowel obstruction with internal hernia S/p exploratory laparotomy with SBO, 06/12/15, Dr. [name] Post op ileus Hx of GI bleed 11/16-11/19/16 ([facility]) Post op anemia, transfused Tachycardia -  On Coreg and Diltiazem at home per list above/discharge 05/30/15 (mostly sinus with PVC's) AODM Insulin dependant Prostate cancer ongoing treatment Hypertension Hyperlipidemia  Malnutrition with prealbumin 3.5 06/16/15.   Antibiotics: None DVT: SCD's only    Plan:  I am going to get him started on TNA, continue to work on mobilizing.   They are not recording PO so I can't tell how much is ice and how much is real gastric fluid.  Discussed with the nursing staff.      LOS: 5 days    Nalanie Winiecki 06/16/2015

## 2015-06-16 NOTE — Progress Notes (Addendum)
Nutrition Follow-up  DOCUMENTATION CODES:   Not applicable  INTERVENTION:  - TPN per pharmacy - Will monitor for diet advancement as medically feasible - RD will continue to monitor for needs  NUTRITION DIAGNOSIS:   Inadequate oral intake related to inability to eat as evidenced by NPO status. -ongoing  GOAL:   Patient will meet greater than or equal to 90% of their needs -unable to be met for pt who has been NPO x5 days  MONITOR:   Diet advancement, Weight trends, Labs, Skin, I & O's  REASON FOR ASSESSMENT:   Consult New TPN/TNA  ASSESSMENT:   79 yo black male who has a history of stage 4 prostate cancer, HTN, DM, tachycardia, recent GI bleed. He had an endoscopy at that time of admission but apparently deferred an inpatient colonoscopy. He just had his colonoscopy done by Dr. Watt Climes on Tuesday. By report, this was normal with no source of bleeding identified. He denies any further bloody BMs sin79 yo black male who has a history of stage 4 prostate cancer, HTN, DM, tachycardia, recent GI bleed. He had an endoscopy at that time of admission but apparently deferred an inpatient colonoscopy. He just had his colonoscopy done by Dr. Watt Climes on Tuesday. By report, this was normal with no source of bleeding identified. He denies any further bloody BMs since this admission. This morning around 6 or 7 am, the patient began having centralized crampy abdominal pain. He denies nausea or vomiting, but admits to no flatus. He deals with constipation on a regular basis. Due to persistent pain, the patient presented to [facility] for further evaluation.ce this admission. This morning around 6 or 7 am, the patient began having centralized crampy abdominal pain. He denies nausea or vomiting, but admits to no flatus. He deals with constipation on a regular basis. Due to persistent pain, the patient presented to [facility] for further evaluation.  12/6 Pt has been NPO since admission. NGT in place  with ~900cc drainage present at time of RD visit; POD #4 ex lap. Pt denies abdominal pain or nausea at this time. He states that PTA he had a fair appetite and would typically eat a light breakfast and a large dinner and would often skip lunch due to being out of the house to appointments. He denies nausea or pain with eating PTA. He indicates that he has been on oral chemo and believes that he experienced taste alteration due to this but is unable to specify how foods tasted different than they had prior to starting medication.   He states that he has had chronic constipation and that he is concerned about diet advancement being held due to inability to have a bowel movement. Consult received for new TPN; will monitor for TPN initiation and ability for surgical team to advance diet.   Pt reports UBW of 210 lbs and that he was stable at this weight for "awhile" PTA. He feels he has lost weight since admission and states that his collar bone appears more prominent than it previously had. No muscle or fat wasting noted during physical assessment.  Pharmacy note today at 1111: At 1800 today:  Start Clinimix E 5/15 at 34m/hr.  20% fat emulsion at 129mhr.  Plan to advance as tolerated to the goal rate.  TPN to contain standard multivitamins and trace elements.  Reduce IVF to 25 ml/hr  Continue SSI sensitive scale q4h .   TPN lab panels on Mondays & Thursdays.   Plan for goal rate  of TPN: Clinimix E 5/15 @ 100 mL/hr with 20% lipids @ 10 mL/hr to provide 2184 kcal (95% minimum estimated kcal needs), 120 gram protein  Not meeting needs since admission. Medications reviewed. Labs reviewed; CBGs: 104-169 mg/dL, Cl: 116 mmol/L, Ca: 6.6 mg/dL, LFTs elevated.  ADDENDUM: Pt states he was typically drinking 1 carton of Premier Protein a day. This supplement provides 100 kcal and 30 grams of protein.    12/2 - He has been NPO since admission and is currently out of the room to OR for laparotomy  related to SBO.  - Unable to obtain information from pt and no family or visitors present at this time.  - Pt was seen by RD 11/17 and was stated to be severely malnourished at that time; will monitor for current states once able to obtain information concerning PO intakes PTA and ability to perform physical assessment.  - Per chart review, pt lost 5 lbs from 05/01/15-05/18/15 and then subsequently gained 4 lbs from 05/18/15-05/29/15. - Current weight of 210 lbs is the same as weight 05/29/15.   Diet Order:  Diet NPO time specified TPN (CLINIMIX-E) Adult  Skin:  Wound (see comment) (Abdominal incision)  Last BM:  PTA  Height:   Ht Readings from Last 1 Encounters:  06/11/15 6' (1.829 m)    Weight:   Wt Readings from Last 1 Encounters:  06/11/15 210 lb 1.6 oz (95.3 kg)    Ideal Body Weight:  80.91 kg (kg)  BMI:  Body mass index is 28.49 kg/(m^2).  Estimated Nutritional Needs:   Kcal:  8208-1388  Protein:  115-130 grams  Fluid:  2.2 L/day  EDUCATION NEEDS:   No education needs identified at this time     Jarome Matin, RD, LDN Inpatient Clinical Dietitian Pager # [phone] After hours/weekend pager # [phone]

## 2015-06-16 NOTE — Progress Notes (Signed)
Peripherally Inserted Central Catheter/Midline Placement  The IV Nurse has discussed with the patient and/or persons authorized to consent for the patient, the purpose of this procedure and the potential benefits and risks involved with this procedure.  The benefits include less needle sticks, lab draws from the catheter and patient may be discharged home with the catheter.  Risks include, but not limited to, infection, bleeding, blood clot (thrombus formation), and puncture of an artery; nerve damage and irregular heat beat.  Alternatives to this procedure were also discussed.  PICC/Midline Placement Documentation  PICC Double Lumen [id number] PICC Right Basilic 41 cm 0 cm (Active)  Indication for Insertion or Continuance of Line Administration of hyperosmolar/irritating solutions (i.e. TPN, Vancomycin, etc.);Prolonged intravenous therapies 06/16/2015  4:17 PM  Exposed Catheter (cm) 0 cm 06/16/2015  4:17 PM  Site Assessment Clean;Dry;Intact 06/16/2015  4:17 PM  Lumen #1 Status Flushed;Saline locked;Blood return noted 06/16/2015  4:17 PM  Lumen #2 Status Flushed;Saline locked;Blood return noted 06/16/2015  4:17 PM  Dressing Type Transparent 06/16/2015  4:17 PM  Dressing Status Clean;Dry;Intact;Antimicrobial disc in place 06/16/2015  4:17 PM  Line Care Connections checked and tightened 06/16/2015  4:17 PM  Line Adjustment (NICU/IV Team Only) No 06/16/2015  4:17 PM  Dressing Intervention New dressing 06/16/2015  4:17 PM  Dressing Change Due 06/23/15 06/16/2015  4:17 PM       Rolena Infante 06/16/2015, 4:17 PM

## 2015-06-16 NOTE — Progress Notes (Signed)
PARENTERAL NUTRITION CONSULT NOTE - INITIAL  Pharmacy Consult for TPN Indication: Prolonged ileus  No Known Allergies  Patient Measurements: Height: 6' (182.9 cm) Weight: 210 lb 1.6 oz (95.3 kg) IBW/kg (Calculated) : 77.6 Adjusted Body Weight: 82.4 kg Usual Weight:  95.3 kg  Vital Signs: Temp: 98 F (36.7 C) (12/06 0412) Temp Source: Oral (12/06 0412) BP: 170/82 mmHg (12/06 0412) Pulse Rate: 77 (12/06 0412) Intake/Output from previous day: 12/05 0701 - 12/06 0700 In: 1398.8 [I.V.:1348.8; IV Piggyback:50] Out: 3775 [Urine:1225; Emesis/NG output:2550] Intake/Output from this shift:    Labs:  Recent Labs  06/14/15 0520 06/15/15 0500 06/16/15 0421  WBC 7.8 6.2 5.0  HGB 9.0* 8.8* 8.6*  HCT 27.4* 27.2* 26.9*  PLT 322 337 344     Recent Labs  06/14/15 0520 06/15/15 0500 06/16/15 0421  NA 142 145 143  K 3.9 3.4* 3.5  CL 114* 115* 116*  CO2 21* 20* 20*  GLUCOSE 179* 118* 142*  BUN 18 15 12   CREATININE 1.11 0.90 0.80  CALCIUM 6.4* 6.9* 6.6*  MG  --  1.6* 2.0  PROT  --   --  5.2*  ALBUMIN  --   --  1.8*  AST  --   --  19  ALT  --   --  13*  ALKPHOS  --   --  290*  BILITOT  --   --  0.6  PREALBUMIN  --   --  3.5*   Estimated Creatinine Clearance: 92.6 mL/min (by C-G formula based on Cr of 0.8).    Recent Labs  06/16/15 0006 06/16/15 0415 06/16/15 0747  GLUCAP 128* 121* 124*    Medical History: Past Medical History  Diagnosis Date  . Hypertension   . Diabetes mellitus   . Heart murmur     SYSTOLIC MURMUR AT BASE  . Hyperlipidemia   . Prostate cancer ([place])   . H/O: GI bleed   . Tachycardia     Medications:  Scheduled:  . sodium chloride  10 mL/hr Intravenous Once  . calcium gluconate  1 g Intravenous Once  . heparin subcutaneous  5,000 Units Subcutaneous 3 times per day  . insulin aspart  0-9 Units Subcutaneous 6 times per day  . insulin glargine  10 Units Subcutaneous Q2200  . metoprolol  5 mg Intravenous 4 times per day  .  pantoprazole (PROTONIX) IV  40 mg Intravenous QHS  . potassium chloride  10 mEq Intravenous Q1 Hr x 3  . prednisoLONE acetate  1 drop Both Eyes BID   Infusions:  . dextrose 5 % and 0.9 % NaCl with KCl 40 mEq/L 75 mL/hr at 06/16/15 0242   PRN: acetaminophen **OR** acetaminophen, albuterol, diphenhydrAMINE **OR** diphenhydrAMINE, menthol-cetylpyridinium, morphine injection, ondansetron **OR** ondansetron (ZOFRAN) IV, phenol   Insulin Requirements in past 24 hours:  1 unit Novolog SSI Lantus 10 units qHS  Current Nutrition: NPO  IVF: D5NS with 40KCl at 75 ml/hr  Central access: PICC ordered 12/6 TPN start date: plan 12/6  ASSESSMENT  HPI: 79 year old male with widely metastatic prostate cancer who presented 12/1 with abdominal pain nausea and vomiting. CT scan has shown evidence of small bowel obstruction with possibly a closed loop obstruction. Follow-up x-ray showed no improvement in small bowel obstruction and underwent emergency laparotomy with small bowel resection on 12/2.  Has now developed SBO with internal hernia and has been managed conservatively. Today, 12/6, CCS has consulted pharmacy to begin TPN for prolonged ileus.   Significant events:   Today: 06/16/2015  Glucose: Controlled on current insulin regimen.  Hx DM2, home regimen - Levemir 12 units qHS, Novolog 12 units TIDWM, Actos 30mg  daily, Victoza 1.8mg  daily.  Current regimen - Lantus 10 units qHS, Novolog SSI sensitive scale  Electrolytes: Ca low = 6.6, corrects to 8.36 with albumin 1.8 - has required almost daily supplementation. K+ at low end of normal. Mg wnl after supplementation yesterday.  Renal: SCr 0.8, CrCl 92 ml/lmin  LFTs: AlkPhos elevated, others wnl  TGs: check in am  Prealbumin: low at 3.5 this am  NUTRITIONAL GOALS                                                                                              RD recs (12/2): Kcal: 2300-2500, Protein: 115-130 grams, Fluid: 2.2 L/day Clinimix E 5/15 at a goal rate of 143ml/hr + 20% fat emulsion at 52ml/hr to provide: 120g/day protein, 2184Kcal/day.  PLAN                                                                                                                          Calcium gluconate 1g IV x 1 KCl 11meq IV x 3 per CCS  At 1800 today:  Start Clinimix E 5/15 at 77ml/hr.  20% fat emulsion at 41ml/hr.  Plan to advance as tolerated to the goal rate.  TPN to contain standard multivitamins and trace elements.  Reduce IVF to 25 ml/hr   Continue SSI sensitive scale q4h .   TPN lab panels on Mondays & Thursdays.  F/u daily.  Peggyann Juba, PharmD, BCPS Pager: [phone] 06/16/2015,11:11 AM

## 2015-06-16 NOTE — Progress Notes (Signed)
Inpatient Diabetes Program Recommendations  AACE/ADA: New Consensus Statement on Inpatient Glycemic Control (2015)  Target Ranges:  Prepandial:   less than 140 mg/dL      Peak postprandial:   less than 180 mg/dL (1-2 hours)      Critically ill patients:  140 - 180 mg/dL   Results for HUBER, JAROSH (MRN [id number]) as of 06/16/2015 08:53  Ref. Range 06/15/2015 00:42 06/15/2015 05:09 06/15/2015 08:00 06/15/2015 11:50 06/15/2015 16:21 06/15/2015 20:02  Glucose-Capillary Latest Ref Range: 65-99 mg/dL 115 (H) 116 (H) 123 (H) 112 (H) 113 (H) 122 (H)    Results for MIKIEL, BERGMAN (MRN [id number]) as of 06/16/2015 08:53  Ref. Range 06/16/2015 00:06 06/16/2015 04:15 06/16/2015 07:47  Glucose-Capillary Latest Ref Range: 65-99 mg/dL 128 (H) 121 (H) 124 (H)    Home DM Meds: Levemir 12 units QHS  Novolog 12 units tidwc  Actos 30 mg daily  Victoza 1.8 mg daily  Current Insulin Orders: Lantus 10 units QHS  Novolog Sensitive SSI (0-9 units) TID AC     -CBGs stable on current insulin regimen.   -Note that patient remains NPO. IVF switched to D5 NS at 75cc/hour yesterday.  -No recommendations at this time.     --Will follow patient during hospitalization--  Wyn Quaker RN, MSN, CDE Diabetes Coordinator Inpatient Glycemic Control Team Team Pager: [phone] (8a-5p)

## 2015-06-16 NOTE — Care Management Note (Signed)
Case Management Note  Patient Details  Name: Jeremy Jennings MRN: [id number] Date of Birth: November 07, 1935  Subjective/Objective:    Pt admitted with SBO                Action/Plan:  Plan to dc home with [place], HHRN/NA/PT   Expected Discharge Date:   (unknown)               Expected Discharge Plan:  [place]  In-House Referral:     Discharge planning Services  CM Consult  Post Acute Care Choice:    Choice offered to:     DME Arranged:    DME Agency:     HH Arranged:  RN, PT, Nurse's Aide [place] Agency:  [place]  Status of Service:  In process, will continue to follow  Medicare Important Message Given:  Yes Date Medicare IM Given:    Medicare IM give by:    Date Additional Medicare IM Given:    Additional Medicare Important Message give by:     If discussed at [place] of Stay Meetings, dates discussed:    Additional CommentsPurcell Mouton, RN 06/16/2015, 12:25 PM

## 2015-06-17 LAB — COMPREHENSIVE METABOLIC PANEL
ALBUMIN: 1.7 g/dL — AB (ref 3.5–5.0)
ALT: 11 U/L — ABNORMAL LOW (ref 17–63)
ANION GAP: 7 (ref 5–15)
AST: 17 U/L (ref 15–41)
Alkaline Phosphatase: 267 U/L — ABNORMAL HIGH (ref 38–126)
BILIRUBIN TOTAL: 0.5 mg/dL (ref 0.3–1.2)
BUN: 10 mg/dL (ref 6–20)
CO2: 22 mmol/L (ref 22–32)
Calcium: 6.9 mg/dL — ABNORMAL LOW (ref 8.9–10.3)
Chloride: 117 mmol/L — ABNORMAL HIGH (ref 101–111)
Creatinine, Ser: 0.76 mg/dL (ref 0.61–1.24)
GFR calc Af Amer: >60 mL/min (ref >60)
GFR calc non Af Amer: >60 mL/min (ref >60)
GLUCOSE: 163 mg/dL — AB (ref 65–99)
POTASSIUM: 3.7 mmol/L (ref 3.5–5.1)
SODIUM: 146 mmol/L — AB (ref 135–145)
TOTAL PROTEIN: 5.2 g/dL — AB (ref 6.5–8.1)

## 2015-06-17 LAB — GLUCOSE, CAPILLARY
GLUCOSE-CAPILLARY: 141 mg/dL — AB (ref 65–99)
GLUCOSE-CAPILLARY: 173 mg/dL — AB (ref 65–99)
Glucose-Capillary: 129 mg/dL — ABNORMAL HIGH (ref 65–99)
Glucose-Capillary: 130 mg/dL — ABNORMAL HIGH (ref 65–99)
Glucose-Capillary: 132 mg/dL — ABNORMAL HIGH (ref 65–99)
Glucose-Capillary: 192 mg/dL — ABNORMAL HIGH (ref 65–99)

## 2015-06-17 LAB — PHOSPHORUS: PHOSPHORUS: 1 mg/dL — AB (ref 2.5–4.6)

## 2015-06-17 LAB — CBC
HCT: 26.3 % — ABNORMAL LOW (ref 39.0–52.0)
HEMOGLOBIN: 8.6 g/dL — AB (ref 13.0–17.0)
MCH: 29.3 pg (ref 26.0–34.0)
MCHC: 32.7 g/dL (ref 30.0–36.0)
MCV: 89.5 fL (ref 78.0–100.0)
Platelets: 319 10*3/uL (ref 150–400)
RBC: 2.94 MIL/uL — ABNORMAL LOW (ref 4.22–5.81)
RDW: 16.9 % — AB (ref 11.5–15.5)
WBC: 5.4 10*3/uL (ref 4.0–10.5)

## 2015-06-17 LAB — DIFFERENTIAL
BASOS ABS: 0 10*3/uL (ref 0.0–0.1)
Basophils Relative: 0 %
EOS ABS: 0.2 10*3/uL (ref 0.0–0.7)
Eosinophils Relative: 4 %
LYMPHS ABS: 0.9 10*3/uL (ref 0.7–4.0)
Lymphocytes Relative: 16 %
MONO ABS: 0.5 10*3/uL (ref 0.1–1.0)
Monocytes Relative: 9 %
NEUTROS PCT: 71 %
Neutro Abs: 3.8 10*3/uL (ref 1.7–7.7)

## 2015-06-17 LAB — MAGNESIUM: Magnesium: 1.9 mg/dL (ref 1.7–2.4)

## 2015-06-17 LAB — TRIGLYCERIDES: TRIGLYCERIDES: 157 mg/dL — AB (ref <150)

## 2015-06-17 LAB — PREALBUMIN: PREALBUMIN: 4.4 mg/dL — AB (ref 18–38)

## 2015-06-17 MED ORDER — POTASSIUM CHLORIDE IN NACL 20-0.45 MEQ/L-% IV SOLN
INTRAVENOUS | Status: DC
  Administered 2015-06-17: 17:00:00 via INTRAVENOUS
  Filled 2015-06-17 (×2): qty 1000

## 2015-06-17 MED ORDER — POTASSIUM PHOSPHATES 15 MMOLE/5ML IV SOLN
30.0000 mmol | Freq: Once | INTRAVENOUS | Status: AC
  Administered 2015-06-17: 30 mmol via INTRAVENOUS
  Filled 2015-06-17: qty 10

## 2015-06-17 MED ORDER — FAT EMULSION 20 % IV EMUL: 250.0000 mL | INTRAVENOUS | Status: DC

## 2015-06-17 MED ORDER — TRACE MINERALS CR-CU-MN-SE-ZN 10-1000-500-60 MCG/ML IV SOLN
INTRAVENOUS | Status: AC
  Administered 2015-06-17: 18:00:00 via INTRAVENOUS
  Filled 2015-06-17: qty 1200

## 2015-06-17 MED ORDER — FAT EMULSION 20 % IV EMUL
250.0000 mL | INTRAVENOUS | Status: AC
  Administered 2015-06-17: 250 mL via INTRAVENOUS
  Filled 2015-06-17: qty 250

## 2015-06-17 MED ORDER — BISACODYL 10 MG RE SUPP: 10.0000 mg | Freq: Every day | RECTAL | Status: DC | PRN

## 2015-06-17 MED ORDER — INSULIN ASPART 100 UNIT/ML ~~LOC~~ SOLN
0.0000 [IU] | SUBCUTANEOUS | Status: DC
  Administered 2015-06-17: 1 [IU] via SUBCUTANEOUS
  Administered 2015-06-17: 3 [IU] via SUBCUTANEOUS
  Administered 2015-06-17 – 2015-06-18 (×2): 2 [IU] via SUBCUTANEOUS
  Administered 2015-06-18: 3 [IU] via SUBCUTANEOUS
  Administered 2015-06-18: 2 [IU] via SUBCUTANEOUS
  Administered 2015-06-18: 3 [IU] via SUBCUTANEOUS
  Administered 2015-06-18 – 2015-06-19 (×2): 2 [IU] via SUBCUTANEOUS
  Administered 2015-06-19 (×2): 3 [IU] via SUBCUTANEOUS
  Administered 2015-06-19: 5 [IU] via SUBCUTANEOUS
  Administered 2015-06-19: 3 [IU] via SUBCUTANEOUS
  Administered 2015-06-20 (×2): 5 [IU] via SUBCUTANEOUS
  Administered 2015-06-20 (×2): 8 [IU] via SUBCUTANEOUS
  Administered 2015-06-20: 5 [IU] via SUBCUTANEOUS
  Administered 2015-06-20: 8 [IU] via SUBCUTANEOUS
  Administered 2015-06-21: 3 [IU] via SUBCUTANEOUS
  Administered 2015-06-21 (×3): 5 [IU] via SUBCUTANEOUS
  Administered 2015-06-21: 3 [IU] via SUBCUTANEOUS
  Administered 2015-06-21: 5 [IU] via SUBCUTANEOUS
  Administered 2015-06-22: 2 [IU] via SUBCUTANEOUS
  Administered 2015-06-22 (×3): 3 [IU] via SUBCUTANEOUS
  Administered 2015-06-22: 2 [IU] via SUBCUTANEOUS
  Administered 2015-06-22 – 2015-06-23 (×3): 3 [IU] via SUBCUTANEOUS

## 2015-06-17 MED ORDER — M.V.I. ADULT IV INJ: INJECTION | INTRAVENOUS | Status: DC

## 2015-06-17 NOTE — Progress Notes (Signed)
PT Cancellation Note  Patient Details Name: Jeremy Jennings MRN: [id number] DOB: Dec 01, 1935   Cancelled Treatment:    Reason Eval/Treat Not Completed: Patient declined, no reason specified Pt reports he just ambulated and also worked with OT today.  He felt too fatigued at this time to participate.   Breckin Savannah,KATHrine E 06/17/2015, 3:47 PM [name], PT, DPT 06/17/2015 Pager: [phone]

## 2015-06-17 NOTE — Evaluation (Signed)
Occupational Therapy Evaluation Patient Details Name: Jeremy Jennings MRN: [id number] DOB: Mar 14, 1936 Today's Date: 06/17/2015    History of Present Illness 79 yo black male who has a history of stage 4 prostate cancer, HTN, DM, tachycardia, recent GI bleed. He had an endoscopy at that time of admission but apparently deferred an inpatient colonoscopy as it was recently done . By report, this was normal with no source of bleeding identified. He denies any further bloody BMs since this admission. Pt admitted with centralized crampy abdominal pain. He denies nausea or vomiting, but admits to no flatus. He deals with constipation on a regular basis. Due to persistent pain, the patient presented to [facility] for further evaluation.Dx of SBO, s/p resection 06/12/15.     Clinical Impression   Pt was admitted for the above.  At time of evaluation, he had some abdominal discomfort and bloating:  Worked within pain tolerance. He will benefit from skilled OT to increase safety and independence with adls.  Pt currently needs min A to stand from a high surface.  He needs up to max A for LB adls. Will follow in acute with min guard to min A level goals    Follow Up Recommendations  Home health OT;Supervision/Assistance - 24 hour (depending upon progress)    Equipment Recommendations   (pt is interested in elongated 3:1, may need to get himself)    Recommendations for Other Services       Precautions / Restrictions Precautions Precautions: Fall Restrictions Weight Bearing Restrictions: No      Mobility Bed Mobility     Rolling: Min assist Sidelying to sit: Min assist       General bed mobility comments: used siderail  Transfers   Equipment used: Rolling walker (2 wheeled)   Sit to Stand: Min assist;From elevated surface         General transfer comment: assist to rise and steady; sidestepped up HOB    Balance                                            ADL  Overall ADL's : Needs assistance/impaired     Grooming: Set up;Sitting   Upper Body Bathing: Sitting;Supervision/ safety (for lines:  NG/PICC)   Lower Body Bathing: Moderate assistance;Sit to/from stand   Upper Body Dressing : Minimal assistance;Sitting (lines)   Lower Body Dressing: Maximal assistance;Sit to/from stand                 General ADL Comments: Educated on concept of  AE but did not use today.  Pt wished to return to supine at end of session.  Pt wheezy with activity--encouraged rest breaks     Vision     Perception     Praxis      Pertinent Vitals/Pain Pain Score: 2  Pain Location: abdomen Pain Descriptors / Indicators: Sore Pain Intervention(s): Limited activity within patient's tolerance;Monitored during session     Hand Dominance     Extremity/Trunk Assessment Upper Extremity Assessment Upper Extremity Assessment: Overall WFL for tasks assessed (has R shoulder arthritis)           Communication Communication Communication: No difficulties   Cognition Arousal/Alertness: Awake/alert Behavior During Therapy: WFL for tasks assessed/performed Overall Cognitive Status: Within Functional Limits for tasks assessed  General Comments       Exercises       Shoulder Instructions      Home Living Family/patient expects to be discharged to:: Private residence Living Arrangements: Alone Available Help at Discharge: Family;Available PRN/intermittently (daughter has been staying; she lives in [place])               [place] Shower/Tub: Walk-in shower   Bathroom Toilet: Handicapped height     Home Equipment: Crutches;Cane - single point;Walker - 2 wheels;Shower seat - built in   Additional Comments: pt states he doesn't have BSC anymore; interested in getting an elongated model:  has trouble getting up from higher commode; no AE      Prior Functioning/Environment Level of Independence: Independent              OT Diagnosis: Generalized weakness   OT Problem List: Decreased strength;Decreased activity tolerance;Decreased knowledge of use of DME or AE;Pain;Cardiopulmonary status limiting activity   OT Treatment/Interventions: Self-care/ADL training;DME and/or AE instruction;Patient/family education;Balance training;Energy conservation    OT Goals(Current goals can be found in the care plan section) Acute Rehab OT Goals Patient Stated Goal: get back to being independent OT Goal Formulation: With patient Time For Goal Achievement: 07/01/15 Potential to Achieve Goals: Good ADL Goals Pt Will Perform Grooming: with supervision;standing Pt Will Perform Lower Body Bathing: with min assist;with adaptive equipment;sit to/from stand Pt Will Perform Lower Body Dressing: with min assist;with adaptive equipment;sit to/from stand Pt Will Transfer to Toilet: with min guard assist;ambulating;bedside commode Pt Will Perform Toileting - Clothing Manipulation and hygiene: with min guard assist;sit to/from stand Additional ADL Goal #1: pt will initiate rest break for energy conservation without cues  OT Frequency: Min 2X/week   Barriers to D/C:            Co-evaluation              End of Session    Activity Tolerance: Patient tolerated treatment well Patient left: in bed;with call bell/phone within reach;with bed alarm set   Time: 1010-1026 OT Time Calculation (min): 16 min Charges:  OT General Charges $OT Visit: 1 Procedure OT Evaluation $Initial OT Evaluation Tier I: 1 Procedure G-Codes:    Eisha Chatterjee Jul 17, 2015, 10:46 AM  Lesle Chris, OTR/L [phone] 2015/07/17

## 2015-06-17 NOTE — Progress Notes (Signed)
PARENTERAL NUTRITION CONSULT NOTE - Follow Up  Pharmacy Consult for TPN Indication: Prolonged ileus  No Known Allergies  Patient Measurements: Height: 6' (182.9 cm) Weight: 210 lb 1.6 oz (95.3 kg) IBW/kg (Calculated) : 77.6 Adjusted Body Weight: 82.4 kg Usual Weight:  95.3 kg  Vital Signs: Temp: 98.3 F (36.8 C) (12/07 0426) Temp Source: Oral (12/07 0426) BP: 162/69 mmHg (12/07 0426) Pulse Rate: 71 (12/07 0426) Intake/Output from previous day: 12/06 0701 - 12/07 0700 In: 1510 [P.O.:1500; I.V.:10] Out: 1550 [Urine:450; Emesis/NG output:1100] Intake/Output from this shift:    Labs:  Recent Labs  06/15/15 0500 06/16/15 0421 06/17/15 0425  WBC 6.2 5.0 5.4  HGB 8.8* 8.6* 8.6*  HCT 27.2* 26.9* 26.3*  PLT 337 344 319     Recent Labs  06/15/15 0500 06/16/15 0421 06/17/15 0425  NA 145 143 146*  K 3.4* 3.5 3.7  CL 115* 116* 117*  CO2 20* 20* 22  GLUCOSE 118* 142* 163*  BUN 15 12 10   CREATININE 0.90 0.80 0.76  CALCIUM 6.9* 6.6* 6.9*  MG 1.6* 2.0 1.9  PHOS  --   --  1.0*  PROT  --  5.2* 5.2*  ALBUMIN  --  1.8* 1.7*  AST  --  19 17  ALT  --  13* 11*  ALKPHOS  --  290* 267*  BILITOT  --  0.6 0.5  PREALBUMIN  --  3.5*  --   TRIG  --   --  157*   Estimated Creatinine Clearance: 92.6 mL/min (by C-G formula based on Cr of 0.76).    Recent Labs  06/17/15 0020 06/17/15 0421 06/17/15 0739  GLUCAP 129* 141* 173*    Medications:  Scheduled:  . sodium chloride  10 mL/hr Intravenous Once  . heparin subcutaneous  5,000 Units Subcutaneous 3 times per day  . insulin aspart  0-9 Units Subcutaneous 6 times per day  . insulin glargine  10 Units Subcutaneous Q2200  . metoprolol  5 mg Intravenous 4 times per day  . pantoprazole (PROTONIX) IV  40 mg Intravenous QHS  . potassium phosphate IVPB (mmol)  30 mmol Intravenous Once  . prednisoLONE acetate  1 drop Both Eyes BID   Infusions:  . dextrose 5 % and 0.9 % NaCl with KCl 40 mEq/L 25 mL/hr at 06/16/15 1742  .  Marland KitchenTPN (CLINIMIX-E) Adult 40 mL/hr at 06/16/15 1734   And  . fat emulsion 250 mL (06/16/15 1734)   PRN: acetaminophen **OR** acetaminophen, albuterol, diphenhydrAMINE **OR** diphenhydrAMINE, menthol-cetylpyridinium, morphine injection, ondansetron **OR** ondansetron (ZOFRAN) IV, phenol, sodium chloride   Insulin Requirements in past 24 hours:  5 unit Novolog SSI since TPN started Lantus 10 units qHS  Current Nutrition: NPO  IVF: D5NS with 40KCl at 25 ml/hr  Central access: PICC placed 12/6 TPN start date: 12/6  ASSESSMENT                                                                                                          HPI: 79 year old male with widely metastatic prostate cancer who presented  12/1 with abdominal pain nausea and vomiting. CT scan has shown evidence of small bowel obstruction with possibly a closed loop obstruction. Follow-up x-ray showed no improvement in small bowel obstruction and underwent emergency laparotomy with small bowel resection on 12/2.  Has now developed SBO with internal hernia and has been managed conservatively. Today, 12/6, CCS has consulted pharmacy to begin TPN for prolonged ileus.   Significant events:   Today: 06/17/2015  Glucose: increasing with TPN initiation.  Hx DM2, home regimen - Levemir 12 units qHS, Novolog 12 units TIDWM, Actos 30mg  daily, Victoza 1.8mg  daily.  Current regimen - Lantus 10 units qHS, Novolog SSI sensitive scale  Electrolytes:   Phos severely low = 1.0  Ca low but improved = 6.9, corrects to 8.74 with albumin 1.74 - has required almost daily supplementation.   Na/Cl slightly elevated  K+ at low end of normal but improved after replacement yesterday  Mg remains wnl after supplementation 12/5.  Renal: SCr 0.76, CrCl 92 ml/lmin  LFTs: AlkPhos elevated, others wnl  TGs: 157 this AM  Prealbumin: low at 3.5 (12/6), pending this AM  NUTRITIONAL GOALS                                                                                              RD recs (12/2): Kcal: 2300-2500, Protein: 115-130 grams, Fluid: 2.2 L/day Clinimix E 5/15 at a goal rate of 123ml/hr + 20% fat emulsion at 37ml/hr to provide: 120g/day protein, 2184Kcal/day.  PLAN                                                                                                                          Potassium phosphate 30Mmol IV x 1   At 1800 today:  Increase Clinimix E 5/15 to 76ml/hr.  20% fat emulsion at 52ml/hr.  Plan to advance as tolerated to the goal rate.  TPN to contain standard multivitamins and trace elements.  Continue IVF to 25 ml/hr - change to 1/2NS with 40KCl  Continue SSI q4h - increase to moderate scale.   TPN lab panels on Mondays & Thursdays.  F/u daily.  Peggyann Juba, PharmD, BCPS Pager: [phone] 06/17/2015,8:31 AM

## 2015-06-17 NOTE — Progress Notes (Signed)
[facility] Progress Note  5 Days Post-Op  Subjective: Pt doing well.  No N/V, eating a lot of ice chips.  NG drainage is clear.  No flatus, but has BS now.  No BM yet.  Ambulating 6x/day.  Anxious to get tube out.  Objective: Vital signs in last 24 hours: Temp:  [97.5 F (36.4 C)-98.3 F (36.8 C)] 98.3 F (36.8 C) (12/07 0426) Pulse Rate:  [71-107] 71 (12/07 0426) Resp:  [18-20] 18 (12/07 0426) BP: (118-162)/(66-76) 162/69 mmHg (12/07 0426) SpO2:  [99 %-100 %] 100 % (12/07 0426) Last BM Date: 06/05/15  Intake/Output from previous day: 12/06 0701 - 12/07 0700 In: 1510 [P.O.:1500; I.V.:10] Out: 1550 [Urine:450; Emesis/NG output:1100] Intake/Output this shift: Total I/O In: 240 [P.O.:240] Out: 200 [Urine:200]  PE: Gen:  Alert, NAD, pleasant Abd: Soft, moderate distension, NT, +BS, no HSM, incisions C/D/I with staples in place, umbilicus bulging ?hematoma or seroma, no erythema or induration to incision site   Lab Results:   Recent Labs  06/16/15 0421 06/17/15 0425  WBC 5.0 5.4  HGB 8.6* 8.6*  HCT 26.9* 26.3*  PLT 344 319   BMET  Recent Labs  06/16/15 0421 06/17/15 0425  NA 143 146*  K 3.5 3.7  CL 116* 117*  CO2 20* 22  GLUCOSE 142* 163*  BUN 12 10  CREATININE 0.80 0.76  CALCIUM 6.6* 6.9*   PT/INR No results for input(s): LABPROT, INR in the last 72 hours. CMP     Component Value Date/Time   NA 146* 06/17/2015 0425   NA 142 05/18/2015 0838   K 3.7 06/17/2015 0425   K 3.5 05/18/2015 0838   CL 117* 06/17/2015 0425   CO2 22 06/17/2015 0425   CO2 18* 05/18/2015 0838   GLUCOSE 163* 06/17/2015 0425   GLUCOSE 84 05/18/2015 0838   BUN 10 06/17/2015 0425   BUN 18.3 05/18/2015 0838   CREATININE 0.76 06/17/2015 0425   CREATININE 0.8 05/18/2015 0838   CALCIUM 6.9* 06/17/2015 0425   CALCIUM 8.4 05/18/2015 0838   PROT 5.2* 06/17/2015 0425   PROT 6.5 05/18/2015 0838   ALBUMIN 1.7* 06/17/2015 0425   ALBUMIN 2.4* 05/18/2015 0838   AST 17  06/17/2015 0425   AST 18 05/18/2015 0838   ALT 11* 06/17/2015 0425   ALT <9 05/18/2015 0838   ALKPHOS 267* 06/17/2015 0425   ALKPHOS 488* 05/18/2015 0838   BILITOT 0.5 06/17/2015 0425   BILITOT 0.33 05/18/2015 0838   GFRNONAA >60 06/17/2015 0425   GFRAA >60 06/17/2015 0425   Lipase     Component Value Date/Time   LIPASE 46 11/09/2014 1137       Studies/Results: No results found.  Anti-infectives: Anti-infectives    Start     Dose/Rate Route Frequency Ordered Stop   06/12/15 0930  cefoTEtan (CEFOTAN) 2 g in dextrose 5 % 50 mL IVPB     2 g 100 mL/hr over 30 Minutes Intravenous On call to O.R. 06/12/15 0852 06/12/15 1359       Assessment/Plan Small bowel obstruction with internal hernia POD #5 S/p exploratory laparotomy with SBO, 06/12/15, Dr. [name] Post op ileus -PICC/TPN -Mobilize and IS -  Therapies following -NG with 1160mL/24hr, but mostly ice/lozenges -Await bowel function before advancing diet and removing NG tube, keep NPO -IVF, pain control, antiemetics -Start home meds back when ileus resolving -Dulcolax prn  Hx of GI bleed 11/16-11/19/16 ([facility]) Post op anemia, transfused Tachycardia - On Coreg and Diltiazem at home per list  above/discharge 05/30/15 (mostly sinus with PVC's) AODM Insulin dependant Prostate cancer ongoing treatment Hypertension - IV metoprolol QID Hyperlipidemia  Malnutrition with prealbumin 3.5 06/16/15 - on TPN Antibiotics: None DVT: SCD's and heparin     LOS: 6 days    Nat Christen 06/17/2015, 9:37 AM Pager: [phone]

## 2015-06-18 ENCOUNTER — Ambulatory Visit: Payer: Medicare Other | Admitting: Cardiology

## 2015-06-18 LAB — URINALYSIS, ROUTINE W REFLEX MICROSCOPIC
Glucose, UA: NEGATIVE mg/dL
KETONES UR: NEGATIVE mg/dL
LEUKOCYTES UA: NEGATIVE
NITRITE: NEGATIVE
PH: 5 (ref 5.0–8.0)
PROTEIN: 30 mg/dL — AB
Specific Gravity, Urine: 1.014 (ref 1.005–1.030)

## 2015-06-18 LAB — COMPREHENSIVE METABOLIC PANEL
ALK PHOS: 346 U/L — AB (ref 38–126)
ALT: 11 U/L — ABNORMAL LOW (ref 17–63)
ANION GAP: 4 — AB (ref 5–15)
AST: 31 U/L (ref 15–41)
Albumin: 1.7 g/dL — ABNORMAL LOW (ref 3.5–5.0)
BUN: 10 mg/dL (ref 6–20)
CALCIUM: 6.6 mg/dL — AB (ref 8.9–10.3)
CO2: 23 mmol/L (ref 22–32)
CREATININE: 0.72 mg/dL (ref 0.61–1.24)
Chloride: 116 mmol/L — ABNORMAL HIGH (ref 101–111)
Glucose, Bld: 182 mg/dL — ABNORMAL HIGH (ref 65–99)
Potassium: 3.7 mmol/L (ref 3.5–5.1)
Sodium: 143 mmol/L (ref 135–145)
TOTAL PROTEIN: 5 g/dL — AB (ref 6.5–8.1)
Total Bilirubin: 0.8 mg/dL (ref 0.3–1.2)

## 2015-06-18 LAB — GLUCOSE, CAPILLARY
GLUCOSE-CAPILLARY: 94 mg/dL (ref 65–99)
Glucose-Capillary: 142 mg/dL — ABNORMAL HIGH (ref 65–99)
Glucose-Capillary: 149 mg/dL — ABNORMAL HIGH (ref 65–99)
Glucose-Capillary: 163 mg/dL — ABNORMAL HIGH (ref 65–99)
Glucose-Capillary: 170 mg/dL — ABNORMAL HIGH (ref 65–99)

## 2015-06-18 LAB — URINE MICROSCOPIC-ADD ON

## 2015-06-18 LAB — MAGNESIUM: MAGNESIUM: 1.8 mg/dL (ref 1.7–2.4)

## 2015-06-18 LAB — PHOSPHORUS: PHOSPHORUS: 1.9 mg/dL — AB (ref 2.5–4.6)

## 2015-06-18 MED ORDER — SODIUM CHLORIDE 0.9 % IV SOLN
30.0000 mmol | Freq: Once | INTRAVENOUS | Status: AC
  Administered 2015-06-18: 30 mmol via INTRAVENOUS
  Filled 2015-06-18: qty 10

## 2015-06-18 MED ORDER — ALTEPLASE 100 MG IV SOLR
2.0000 mg | Freq: Once | INTRAVENOUS | Status: AC
  Administered 2015-06-18: 2 mg
  Filled 2015-06-18: qty 2

## 2015-06-18 MED ORDER — MAGNESIUM SULFATE IN D5W 10-5 MG/ML-% IV SOLN
1.0000 g | Freq: Once | INTRAVENOUS | Status: AC
  Administered 2015-06-18: 1 g via INTRAVENOUS
  Filled 2015-06-18: qty 100

## 2015-06-18 MED ORDER — FAT EMULSION 20 % IV EMUL
240.0000 mL | INTRAVENOUS | Status: AC
  Administered 2015-06-18: 240 mL via INTRAVENOUS
  Filled 2015-06-18: qty 250

## 2015-06-18 MED ORDER — ALTEPLASE 2 MG IJ SOLR
2.0000 mg | Freq: Once | INTRAMUSCULAR | Status: AC
  Administered 2015-06-18: 2 mg
  Filled 2015-06-18: qty 2

## 2015-06-18 MED ORDER — TRACE MINERALS CR-CU-MN-SE-ZN 10-1000-500-60 MCG/ML IV SOLN
INTRAVENOUS | Status: AC
  Administered 2015-06-18: 18:00:00 via INTRAVENOUS
  Filled 2015-06-18: qty 1800

## 2015-06-18 MED ORDER — SODIUM CHLORIDE 0.9 % IV SOLN
1.0000 g | Freq: Once | INTRAVENOUS | Status: AC
  Administered 2015-06-18: 1 g via INTRAVENOUS
  Filled 2015-06-18: qty 10

## 2015-06-18 NOTE — Progress Notes (Signed)
Pt had a fever of 102.35F, Dr. Harlow Asa notified. Blood cx & U/A ordered, CXR & CBC in am. Will cont to monitor.

## 2015-06-18 NOTE — Progress Notes (Signed)
PT TX NOTE  06/18/15 1000  PT Visit Information  Last PT Received On 06/18/15  Assistance Needed +1  History of Present Illness 79 yo black male who has a history of stage 4 prostate cancer, HTN, DM, tachycardia, recent GI bleed. He had an endoscopy at that time of admission but apparently deferred an inpatient colonoscopy. He just had his colonoscopy done by Dr. Watt Jennings on Tuesday. By report, this was normal with no source of bleeding identified. He denies any further bloody BMs since this admission. Pt admitted with centralized crampy abdominal pain. He denies nausea or vomiting, but admits to no flatus. He deals with constipation on a regular basis. Due to persistent pain, the patient presented to [facility] for further evaluation.Dx of SBO, s/p resection 06/12/15.    PT Time Calculation  PT Start Time (ACUTE ONLY) 0903  PT Stop Time (ACUTE ONLY) 0927  PT Time Calculation (min) (ACUTE ONLY) 24 min  Subjective Data  Subjective Pt's daughter, Jeremy Jennings, is in the room and states she won't be able to live with the pt, but they are planning on hiring professional assistant for her dad while he is recovering at home.   Patient Stated Goal get back to being independent  Restrictions  Weight Bearing Restrictions No  Pain Assessment  Pain Assessment 0-10  Pain Score 3  Pain Location abdomen, during bed mobility and transfers  Pain Descriptors / Indicators Sore  Pain Intervention(s) Limited activity within patient's tolerance;Monitored during session;Repositioned  Cognition  Arousal/Alertness Awake/alert  Behavior During Therapy WFL for tasks assessed/performed  Overall Cognitive Status Within Functional Limits for tasks assessed  Bed Mobility  Overal bed mobility Needs Assistance  Bed Mobility Rolling;Sidelying to Sit  Rolling Min assist  Sidelying to sit Min assist  General bed mobility comments min cues for correct technique and use of UEs to self-assist with trunk, min assist steadying   Transfers  Overall transfer level Needs assistance  Equipment used Rolling walker (2 wheeled)  Transfers Sit to/from Stand  Sit to Stand Min guard;From elevated surface  General transfer comment min guard assist for safety, min cues for hand placement, assist with lines   Ambulation/Gait  Ambulation/Gait assistance Min guard  Ambulation Distance (Feet) 350 Feet  Assistive device Rolling walker (2 wheeled)  Gait Pattern/deviations Step-through pattern;Decreased stride length;Drifts right/left  General Gait Details min guard for safety, no LOB, HR remained within 130-140bpm range increased to > 140bpm by the end of the ambulation; then decreased to <130bpm with rest; cues to slow down during ambukation and take breaks when HR increases; dyspnea continues 2-3/4 during ambulation   Balance  Overall balance assessment Needs assistance  Standing balance-Leahy Scale Fair  PT - End of Session  Equipment Utilized During Treatment Gait belt  Activity Tolerance Patient tolerated treatment well  Patient left in chair;with call bell/phone within reach;with family/visitor present  Nurse Communication Mobility status  PT - Assessment/Plan  PT Plan Current plan remains appropriate  PT Frequency (ACUTE ONLY) Min 3X/week  Follow Up Recommendations Home health PT;Supervision/Assistance - 24 hour  PT equipment None recommended by PT  PT Goal Progression  Progress towards PT goals Progressing toward goals  Acute Rehab PT Goals  PT Goal Formulation With patient/family  Time For Goal Achievement 06/29/15  Potential to Achieve Goals Good  PT General Charges  $$ ACUTE PT VISIT 1 Procedure  PT Treatments  $Gait Training 8-22 mins  Bronson, SPT 06/18/2015

## 2015-06-18 NOTE — Progress Notes (Signed)
6 Days Post-Op  Subjective: Passing gas.  No BM.  Objective: Vital signs in last 24 hours: Temp:  [98.4 F (36.9 C)-98.9 F (37.2 C)] 98.9 F (37.2 C) (12/08 0459) Pulse Rate:  [97-110] 110 (12/08 0459) Resp:  [18-20] 18 (12/08 0459) BP: (147-167)/(72-85) 167/76 mmHg (12/08 0459) SpO2:  [99 %-100 %] 99 % (12/08 0459) Last BM Date: 06/05/15  Intake/Output from previous day: 12/07 0701 - 12/08 0700 In: 600 [P.O.:600] Out: 2050 [Urine:650; Emesis/NG output:1400] Intake/Output this shift:    PE: General- In NAD Abdomen-soft, hypoactive bowel sounds, wound clean and intact  Lab Results:   Recent Labs  06/16/15 0421 06/17/15 0425  WBC 5.0 5.4  HGB 8.6* 8.6*  HCT 26.9* 26.3*  PLT 344 319   BMET  Recent Labs  06/17/15 0425 06/18/15 0330  NA 146* 143  K 3.7 3.7  CL 117* 116*  CO2 22 23  GLUCOSE 163* 182*  BUN 10 10  CREATININE 0.76 0.72  CALCIUM 6.9* 6.6*   PT/INR No results for input(s): LABPROT, INR in the last 72 hours. Comprehensive Metabolic Panel:    Component Value Date/Time   NA 143 06/18/2015 0330   NA 146* 06/17/2015 0425   NA 142 05/18/2015 0838   NA 138 04/22/2015 1509   K 3.7 06/18/2015 0330   K 3.7 06/17/2015 0425   K 3.5 05/18/2015 0838   K 3.5 04/22/2015 1509   CL 116* 06/18/2015 0330   CL 117* 06/17/2015 0425   CO2 23 06/18/2015 0330   CO2 22 06/17/2015 0425   CO2 18* 05/18/2015 0838   CO2 22 04/22/2015 1509   BUN 10 06/18/2015 0330   BUN 10 06/17/2015 0425   BUN 18.3 05/18/2015 0838   BUN 16.2 04/22/2015 1509   CREATININE 0.72 06/18/2015 0330   CREATININE 0.76 06/17/2015 0425   CREATININE 0.8 05/18/2015 0838   CREATININE 0.9 04/22/2015 1509   GLUCOSE 182* 06/18/2015 0330   GLUCOSE 163* 06/17/2015 0425   GLUCOSE 84 05/18/2015 0838   GLUCOSE 288* 04/22/2015 1509   CALCIUM 6.6* 06/18/2015 0330   CALCIUM 6.9* 06/17/2015 0425   CALCIUM 8.4 05/18/2015 0838   CALCIUM 7.9* 04/22/2015 1509   AST 31 06/18/2015 0330   AST 17  06/17/2015 0425   AST 18 05/18/2015 0838   AST 15 04/22/2015 1509   ALT 11* 06/18/2015 0330   ALT 11* 06/17/2015 0425   ALT <9 05/18/2015 0838   ALT <9 04/22/2015 1509   ALKPHOS 346* 06/18/2015 0330   ALKPHOS 267* 06/17/2015 0425   ALKPHOS 488* 05/18/2015 0838   ALKPHOS 499* 04/22/2015 1509   BILITOT 0.8 06/18/2015 0330   BILITOT 0.5 06/17/2015 0425   BILITOT 0.33 05/18/2015 0838   BILITOT 0.37 04/22/2015 1509   PROT 5.0* 06/18/2015 0330   PROT 5.2* 06/17/2015 0425   PROT 6.5 05/18/2015 0838   PROT 5.9* 04/22/2015 1509   ALBUMIN 1.7* 06/18/2015 0330   ALBUMIN 1.7* 06/17/2015 0425   ALBUMIN 2.4* 05/18/2015 0838   ALBUMIN 2.2* 04/22/2015 1509     Studies/Results: No results found.  Anti-infectives: Anti-infectives    Start     Dose/Rate Route Frequency Ordered Stop   06/12/15 0930  cefoTEtan (CEFOTAN) 2 g in dextrose 5 % 50 mL IVPB     2 g 100 mL/hr over 30 Minutes Intravenous On call to O.R. 06/12/15 0852 06/12/15 1359      Assessment  S/p exploratory laparotomy with LOA for SBO, 06/12/15, Dr. [name] Post op ileus-starting  to resolve -PICC/TPN for nutritional support   LOS: 7 days   Plan: Remove ng tube.  Clear liquid diet.   Precious Gilchrest J 06/18/2015

## 2015-06-18 NOTE — Progress Notes (Signed)
PARENTERAL NUTRITION CONSULT NOTE - Follow Up  Pharmacy Consult for TPN Indication: Prolonged ileus  No Known Allergies  Patient Measurements: Height: 6' (182.9 cm) Weight: 210 lb 1.6 oz (95.3 kg) IBW/kg (Calculated) : 77.6 Adjusted Body Weight: 82.4 kg Usual Weight:  95.3 kg  Vital Signs: Temp: 98.9 F (37.2 C) (12/08 0459) Temp Source: Oral (12/08 0459) BP: 167/76 mmHg (12/08 0459) Pulse Rate: 110 (12/08 0459) Intake/Output from previous day: 12/07 0701 - 12/08 0700 In: 600 [P.O.:600] Out: 2050 [Urine:650; Emesis/NG output:1400] Intake/Output from this shift:    Labs:  Recent Labs  06/16/15 0421 06/17/15 0425  WBC 5.0 5.4  HGB 8.6* 8.6*  HCT 26.9* 26.3*  PLT 344 319     Recent Labs  06/16/15 0421 06/17/15 0425 06/18/15 0330  NA 143 146* 143  K 3.5 3.7 3.7  CL 116* 117* 116*  CO2 20* 22 23  GLUCOSE 142* 163* 182*  BUN 12 10 10   CREATININE 0.80 0.76 0.72  CALCIUM 6.6* 6.9* 6.6*  MG 2.0 1.9 1.8  PHOS  --  1.0* 1.9*  PROT 5.2* 5.2* 5.0*  ALBUMIN 1.8* 1.7* 1.7*  AST 19 17 31   ALT 13* 11* 11*  ALKPHOS 290* 267* 346*  BILITOT 0.6 0.5 0.8  PREALBUMIN 3.5* 4.4*  --   TRIG  --  157*  --    Estimated Creatinine Clearance: 92.6 mL/min (by C-G formula based on Cr of 0.72).    Recent Labs  06/18/15 0030 06/18/15 0502 06/18/15 0737  GLUCAP 149* 163* 170*    Medications:  Scheduled:  . sodium chloride  10 mL/hr Intravenous Once  . heparin subcutaneous  5,000 Units Subcutaneous 3 times per day  . insulin aspart  0-15 Units Subcutaneous 6 times per day  . insulin glargine  10 Units Subcutaneous Q2200  . metoprolol  5 mg Intravenous 4 times per day  . pantoprazole (PROTONIX) IV  40 mg Intravenous QHS  . prednisoLONE acetate  1 drop Both Eyes BID   Infusions:  . Marland KitchenTPN (CLINIMIX-E) Adult 50 mL/hr at 06/17/15 1807   And  . fat emulsion 250 mL (06/17/15 1807)  . 0.45 % NaCl with KCl 20 mEq / L 25 mL/hr at 06/17/15 1704   PRN: acetaminophen **OR**  acetaminophen, albuterol, bisacodyl, diphenhydrAMINE **OR** diphenhydrAMINE, menthol-cetylpyridinium, morphine injection, ondansetron **OR** ondansetron (ZOFRAN) IV, phenol, sodium chloride   Insulin Requirements in past 24 hours:  10 unit Novolog SSI yesterday Lantus 10 units qHS  Current Nutrition: NPO  IVF: 1/2 NS with 20KCl at 25 ml/hr  Central access: PICC placed 12/6 TPN start date: 12/6  ASSESSMENT                                                                                                          HPI: 79 year old male with widely metastatic prostate cancer who presented 12/1 with abdominal pain nausea and vomiting. CT scan has shown evidence of small bowel obstruction with possibly a closed loop obstruction. Follow-up x-ray showed no improvement in small bowel obstruction and underwent emergency laparotomy with  small bowel resection on 12/2.  Has now developed SBO with internal hernia and has been managed conservatively. Today, 12/6, CCS has consulted pharmacy to begin TPN for prolonged ileus.   Significant events:   Today: 06/18/2015  Glucose: increasing with TPN initiation.  Hx DM2, home regimen - Levemir 12 units qHS, Novolog 12 units TIDWM, Actos 30mg  daily, Victoza 1.8mg  daily.  Current regimen - Lantus 10 units qHS, Novolog SSI sensitive scale  Electrolytes:   Phos still low = 1.9, post replacement yesterday  Ca low  = 6.9, corrects to 8.4 with albumin 1.7 - has required almost daily supplementation.   Na WNL, Cl slightly elevated  K+ at low end of normal   Mg low end of normal  Renal: SCr 0.72, CrCl 92 ml/lmin  LFTs: AlkPhos elevated, others wnl  TGs: 157 (12/7) Prealbumin: low at 3.5 (12/6),4.4 (12/7) NUTRITIONAL GOALS                                                                                             RD recs (12/2): Kcal: 2300-2500, Protein: 115-130 grams, Fluid: 2.2 L/day Clinimix E 5/15 at a goal rate of 191ml/hr + 20% fat emulsion at  45ml/hr to provide: 120g/day protein, 2184Kcal/day.  PLAN                                                                                                                          Potassium phosphate 30Mmol IV x 1  Calcium Gluconate 1gm IV x 1 Magnesium sulfate 1gm IV x 1  At 1800 today:  Increase Clinimix E 5/15 to 54ml/hr.  20% fat emulsion at 87ml/hr.  Plan to advance as tolerated to the goal rate.  TPN to contain standard multivitamins and trace elements.  Continue 1/2NS with 20KCl @ 50mls/hr  Continue moderate SSI q4h   BMet with mag and phos tomorrow   TPN lab panels on Mondays & Thursdays.  F/u daily.  Dolly Rias RPh 06/18/2015, 9:51 AM Pager [phone]

## 2015-06-18 NOTE — Progress Notes (Signed)
Pt up to the bathroom and passed gas several times, no BM. Pt abd less distended. SRP, RN

## 2015-06-19 ENCOUNTER — Inpatient Hospital Stay (HOSPITAL_COMMUNITY): Payer: Medicare Other

## 2015-06-19 DIAGNOSIS — R600 Localized edema: Secondary | ICD-10-CM

## 2015-06-19 DIAGNOSIS — E872 Acidosis: Secondary | ICD-10-CM

## 2015-06-19 DIAGNOSIS — R531 Weakness: Secondary | ICD-10-CM

## 2015-06-19 DIAGNOSIS — I509 Heart failure, unspecified: Secondary | ICD-10-CM

## 2015-06-19 DIAGNOSIS — R609 Edema, unspecified: Secondary | ICD-10-CM

## 2015-06-19 DIAGNOSIS — Z9889 Other specified postprocedural states: Secondary | ICD-10-CM

## 2015-06-19 DIAGNOSIS — D649 Anemia, unspecified: Secondary | ICD-10-CM

## 2015-06-19 DIAGNOSIS — E118 Type 2 diabetes mellitus with unspecified complications: Secondary | ICD-10-CM

## 2015-06-19 DIAGNOSIS — Z794 Long term (current) use of insulin: Secondary | ICD-10-CM

## 2015-06-19 LAB — BASIC METABOLIC PANEL
ANION GAP: 10 (ref 5–15)
BUN: 14 mg/dL (ref 6–20)
CALCIUM: 6.5 mg/dL — AB (ref 8.9–10.3)
CO2: 17 mmol/L — AB (ref 22–32)
CREATININE: 0.92 mg/dL (ref 0.61–1.24)
Chloride: 113 mmol/L — ABNORMAL HIGH (ref 101–111)
GFR calc Af Amer: >60 mL/min (ref >60)
GFR calc non Af Amer: >60 mL/min (ref >60)
GLUCOSE: 187 mg/dL — AB (ref 65–99)
Potassium: 4 mmol/L (ref 3.5–5.1)
Sodium: 140 mmol/L (ref 135–145)

## 2015-06-19 LAB — CBC WITH DIFFERENTIAL/PLATELET
BASOS ABS: 0 10*3/uL (ref 0.0–0.1)
Basophils Relative: 0 %
Eosinophils Absolute: 0.1 10*3/uL (ref 0.0–0.7)
Eosinophils Relative: 1 %
HEMATOCRIT: 28.3 % — AB (ref 39.0–52.0)
Hemoglobin: 8.6 g/dL — ABNORMAL LOW (ref 13.0–17.0)
LYMPHS ABS: 1.4 10*3/uL (ref 0.7–4.0)
Lymphocytes Relative: 15 %
MCH: 28.5 pg (ref 26.0–34.0)
MCHC: 30.4 g/dL (ref 30.0–36.0)
MCV: 93.7 fL (ref 78.0–100.0)
MONO ABS: 0.8 10*3/uL (ref 0.1–1.0)
MONOS PCT: 9 %
NEUTROS ABS: 7 10*3/uL (ref 1.7–7.7)
Neutrophils Relative %: 75 %
Platelets: 266 10*3/uL (ref 150–400)
RBC: 3.02 MIL/uL — ABNORMAL LOW (ref 4.22–5.81)
RDW: 17.2 % — AB (ref 11.5–15.5)
WBC: 9.3 10*3/uL (ref 4.0–10.5)

## 2015-06-19 LAB — BRAIN NATRIURETIC PEPTIDE: B Natriuretic Peptide: 258.8 pg/mL — ABNORMAL HIGH (ref 0.0–100.0)

## 2015-06-19 LAB — GLUCOSE, CAPILLARY
GLUCOSE-CAPILLARY: 142 mg/dL — AB (ref 65–99)
GLUCOSE-CAPILLARY: 182 mg/dL — AB (ref 65–99)
GLUCOSE-CAPILLARY: 191 mg/dL — AB (ref 65–99)
Glucose-Capillary: 173 mg/dL — ABNORMAL HIGH (ref 65–99)
Glucose-Capillary: 174 mg/dL — ABNORMAL HIGH (ref 65–99)
Glucose-Capillary: 238 mg/dL — ABNORMAL HIGH (ref 65–99)
Glucose-Capillary: 166 mg/dL — ABNORMAL HIGH (ref 65–99)

## 2015-06-19 LAB — LACTIC ACID, PLASMA
Lactic Acid, Venous: 1.1 mmol/L (ref 0.5–2.0)
Lactic Acid, Venous: 1.1 mmol/L (ref 0.5–2.0)

## 2015-06-19 LAB — MAGNESIUM: Magnesium: 1.8 mg/dL (ref 1.7–2.4)

## 2015-06-19 LAB — PHOSPHORUS: Phosphorus: 2.5 mg/dL (ref 2.5–4.6)

## 2015-06-19 LAB — TROPONIN I: TROPONIN I: 0.04 ng/mL — AB (ref <0.031)

## 2015-06-19 MED ORDER — IOHEXOL 350 MG/ML SOLN
100.0000 mL | Freq: Once | INTRAVENOUS | Status: AC | PRN
  Administered 2015-06-19: 100 mL via INTRAVENOUS

## 2015-06-19 MED ORDER — PIPERACILLIN-TAZOBACTAM 3.375 G IVPB
3.3750 g | Freq: Three times a day (TID) | INTRAVENOUS | Status: DC
  Administered 2015-06-19 – 2015-06-23 (×12): 3.375 g via INTRAVENOUS
  Filled 2015-06-19 (×10): qty 50

## 2015-06-19 MED ORDER — IOHEXOL 300 MG/ML  SOLN
25.0000 mL | INTRAMUSCULAR | Status: AC
  Administered 2015-06-19 (×2): 25 mL via ORAL

## 2015-06-19 MED ORDER — VANCOMYCIN HCL 10 G IV SOLR
2000.0000 mg | Freq: Once | INTRAVENOUS | Status: AC
  Administered 2015-06-19: 2000 mg via INTRAVENOUS
  Filled 2015-06-19: qty 2000

## 2015-06-19 MED ORDER — METOPROLOL TARTRATE 1 MG/ML IV SOLN: 5.0000 mg | INTRAVENOUS | Status: DC

## 2015-06-19 MED ORDER — VANCOMYCIN HCL 10 G IV SOLR
1250.0000 mg | Freq: Two times a day (BID) | INTRAVENOUS | Status: DC
  Administered 2015-06-20 – 2015-06-22 (×5): 1250 mg via INTRAVENOUS
  Filled 2015-06-19 (×6): qty 1250

## 2015-06-19 MED ORDER — DEXTROSE 10 % IV SOLN
INTRAVENOUS | Status: DC
  Administered 2015-06-19: 16:00:00 via INTRAVENOUS
  Filled 2015-06-19 (×2): qty 1000

## 2015-06-19 MED ORDER — DEXTROSE 5 % IV SOLN
2.0000 g | INTRAVENOUS | Status: DC
  Administered 2015-06-19: 2 g via INTRAVENOUS
  Filled 2015-06-19: qty 2

## 2015-06-19 MED ORDER — MAGNESIUM SULFATE IN D5W 10-5 MG/ML-% IV SOLN
1.0000 g | Freq: Once | INTRAVENOUS | Status: AC
Start: 2015-06-19 — End: 2015-06-19
  Administered 2015-06-19: 1 g via INTRAVENOUS
  Filled 2015-06-19: qty 100

## 2015-06-19 MED ORDER — SODIUM CHLORIDE 0.9 % IV SOLN
10.0000 mmol | Freq: Once | INTRAVENOUS | Status: AC
  Administered 2015-06-19: 10 mmol via INTRAVENOUS
  Filled 2015-06-19: qty 3.33

## 2015-06-19 MED ORDER — FAT EMULSION 20 % IV EMUL
240.0000 mL | INTRAVENOUS | Status: AC
  Administered 2015-06-19: 240 mL via INTRAVENOUS
  Filled 2015-06-19: qty 250

## 2015-06-19 MED ORDER — METOPROLOL TARTRATE 1 MG/ML IV SOLN
5.0000 mg | Freq: Four times a day (QID) | INTRAVENOUS | Status: DC
  Administered 2015-06-19 – 2015-06-20 (×3): 5 mg via INTRAVENOUS
  Filled 2015-06-19 (×3): qty 5

## 2015-06-19 MED ORDER — SODIUM CHLORIDE 0.9 % IV SOLN
1.0000 g | Freq: Once | INTRAVENOUS | Status: AC
  Administered 2015-06-19: 1 g via INTRAVENOUS
  Filled 2015-06-19: qty 10

## 2015-06-19 MED ORDER — DEXTROSE 10 % IV SOLN: INTRAVENOUS | Status: DC

## 2015-06-19 MED ORDER — TRACE MINERALS CR-CU-MN-SE-ZN 10-1000-500-60 MCG/ML IV SOLN
INTRAVENOUS | Status: AC
  Administered 2015-06-19: 18:00:00 via INTRAVENOUS
  Filled 2015-06-19: qty 2000

## 2015-06-19 NOTE — Progress Notes (Signed)
ANTIBIOTIC CONSULT NOTE - INITIAL  Pharmacy Consult for vancomycin and zosyn Indication: HCAP  No Known Allergies  Patient Measurements: Height: 6' (182.9 cm) Weight: 210 lb 1.6 oz (95.3 kg) IBW/kg (Calculated) : 77.6   Vital Signs: Temp: 97.7 F (36.5 C) (12/09 0449) Temp Source: Oral (12/09 0449) BP: 146/64 mmHg (12/09 0449) Pulse Rate: 113 (12/09 0449) Intake/Output from previous day: 12/08 0701 - 12/09 0700 In: 240 [P.O.:240] Out: 550 [Urine:550] Intake/Output from this shift:    Labs:  Recent Labs  06/17/15 0425 06/18/15 0330 06/19/15 0517  WBC 5.4  --  9.3  HGB 8.6*  --  8.6*  PLT 319  --  266  CREATININE 0.76 0.72 0.92   Estimated Creatinine Clearance: 80.6 mL/min (by C-G formula based on Cr of 0.92). No results for input(s): VANCOTROUGH, VANCOPEAK, VANCORANDOM, GENTTROUGH, GENTPEAK, GENTRANDOM, TOBRATROUGH, TOBRAPEAK, TOBRARND, AMIKACINPEAK, AMIKACINTROU, AMIKACIN in the last 72 hours.   Microbiology: Recent Results (from the past 720 hour(s))  MRSA PCR Screening     Status: None   Collection Time: 05/27/15  6:20 PM  Result Value Ref Range Status   MRSA by PCR NEGATIVE NEGATIVE Final    Comment:        The GeneXpert MRSA Assay (FDA approved for NASAL specimens only), is one component of a comprehensive MRSA colonization surveillance program. It is not intended to diagnose MRSA infection nor to guide or monitor treatment for MRSA infections.   Surgical pcr screen     Status: None   Collection Time: 06/12/15 12:40 PM  Result Value Ref Range Status   MRSA, PCR NEGATIVE NEGATIVE Final   Staphylococcus aureus NEGATIVE NEGATIVE Final    Comment:        The Xpert SA Assay (FDA approved for NASAL specimens in patients over 73 years of age), is one component of a comprehensive surveillance program.  Test performance has been validated by [facility] for patients greater than or equal to 50 year old. It is not intended to diagnose infection nor  to guide or monitor treatment.   Culture, blood (routine x 2)     Status: None (Preliminary result)   Collection Time: 06/18/15  9:11 PM  Result Value Ref Range Status   Specimen Description BLOOD LEFT ASSIST CONTROL  Final   Special Requests BOTTLES DRAWN AEROBIC ONLY 5ML  Final   Culture   Final    NO GROWTH < 12 HOURS Performed at [facility]    Report Status PENDING  Incomplete  Culture, blood (routine x 2)     Status: None (Preliminary result)   Collection Time: 06/18/15  9:15 PM  Result Value Ref Range Status   Specimen Description BLOOD LEFT HAND  Final   Special Requests BOTTLES DRAWN AEROBIC ONLY 5ML  Final   Culture   Final    NO GROWTH < 12 HOURS Performed at [facility]    Report Status PENDING  Incomplete    Medical History: Past Medical History  Diagnosis Date  . Hypertension   . Diabetes mellitus   . Heart murmur     SYSTOLIC MURMUR AT BASE  . Hyperlipidemia   . Prostate cancer ([place])   . H/O: GI bleed   . Tachycardia     Assessment: Jeremy Jennings well known to pharmacy by dosing TPN, this morning noted to be febrile with T 102.4, HR 131, BP 136/60.  Pharmacy consulted to dose vancomycin and zosyn for HCAP.  Scr 0.92,   CrCl~ 5mls/min (  N)  Goal of Therapy:  Vancomycin trough level 15-20 mcg/ml  Plan:  Zosyn 3.375g IV Q8H infused over 4hrs. Vancomycin 2gm IV x 1, then 1250mg  IV q12h Follow renal function, Vanc trough at steady state as needed  Dolly Rias RPh 06/19/2015, 1:15 PM Pager [phone]

## 2015-06-19 NOTE — Consult Note (Signed)
[facility] Initial Consultation Note   Patient Name: Jeremy Jennings    STM:[id number] PCP: [name], MD     DOB: [birth year]  DOA: 06/11/2015 DOS: the patient was seen and examined on 06/19/2015   Referring physician: Dr. Craig Staggers Reason for consult: Medical management  HPI: Jeremy Jennings is a 79 y.o. male with Past medical history of essential hypertension, type 2 diabetes mellitus, dyslipidemia, stage IV prostate cancer castration resistant. Patient is coming from home. Patient presented on 06/11/2015 with a complaint of central crampy abdominal pain with obstipation. Patient had a CT scan in the ER which showed small bowel obstruction. There was a question of perforation on presentation but surgery did not felt any evidence of perforation on the imaging. Patient was admitted for conservative management and repeat x-ray showed a small amount of free air and persistent small bowel obstruction. Patient underwent explorative laparotomy and small bowel resection on 06/12/2015. Postoperatively the patient remained tachycardic. Patient was on Cardizem and Coreg as an outpatient, and he was given IV Lopressor here in the hospital due to his nothing by mouth status. Today the patient was found febrile and the tachycardia was persistent despite increasing the dose of the Lopressor and with concern for ongoing infection medicine was consulted. At the time of my evaluation the patient did not have any acute complaints other than generalized weakness. Patient denies any complaints of dizziness or lightheadedness, vision changes or any other focal deficit, chest pain, shortness of breath, cough, sore throat, nausea or vomiting, abdominal pain, burning urination, leg cramps. Patient has bilateral leg swelling which he mentions has been ongoing for last 2-3 month and progressively worsening. Patient denies any symptoms of sleep apnea. Patient has been passing gas but no bowel movement so  far. Patient has been on TPN and had a PICC line but the PICC line needs to be exchanged today since one of the lumen is not working appropriately. Patient uses incentive spirometer 3-4 times a day but yesterday was not able to use it more frequently.  Review of Systems: as mentioned in the history of present illness.  A Comprehensive review of the other systems is negative.  Past Medical History  Diagnosis Date  . Hypertension   . Diabetes mellitus   . Heart murmur     SYSTOLIC MURMUR AT BASE  . Hyperlipidemia   . Prostate cancer ([place])   . H/O: GI bleed   . Tachycardia    Past Surgical History  Procedure Laterality Date  . Tonsillectomy      IN [facility]  . Hemorroidectomy  1969  . Esophagogastroduodenoscopy N/A 05/28/2015    Procedure: ESOPHAGOGASTRODUODENOSCOPY (EGD);  Surgeon: [name], MD;  Location: [name];  Service: Endoscopy;  Laterality: N/A;  . Hernia repair  AFE 16-17    bilateral inguinal hernias  . Hernia repair  1981    [facility] [place]  . Laparotomy N/A 06/12/2015    Procedure: EXPLORATORY LAPAROTOMY;  Surgeon: [name], MD;  Location: [facility];  Service: General;  Laterality: N/A;  . Bowel resection N/A 06/12/2015    Procedure: SMALL BOWEL RESECTION;  Surgeon: [name], MD;  Location: [facility];  Service: General;  Laterality: N/A;   Social History:  reports that he quit smoking about 35 years ago. His smoking use included Cigarettes. He has a 5 pack-year smoking history. He has never used smokeless tobacco. He reports that he does not drink alcohol or use illicit drugs.  No Known Allergies  Family History  Problem Relation Age of Onset  . Cancer Neg Hx   . Kidney failure Brother   . Kidney failure Sister     Prior to Admission medications   Medication Sig Start Date End Date Taking? Authorizing Provider  abiraterone Acetate (ZYTIGA) 250 MG tablet Take 4 tablets (1,000 mg total) by mouth daily. Take on an empty stomach 1 hour before or 2 hours  after a meal 05/22/15  Yes Wyatt Portela, MD  ACCU-CHEK SOFTCLIX LANCETS lancets Use as instructed to check blood sugar 2 times per day dx code E11.65 10/23/14  Yes Elayne Snare, MD  acetaminophen (TYLENOL) 650 MG CR tablet Take 1 tablet (650 mg total) by mouth every 8 (eight) hours as needed for pain. 05/30/15  Yes Florencia Reasons, MD  atorvastatin (LIPITOR) 10 MG tablet take 1 tablet by mouth once daily 07/09/14  Yes Darlin Coco, MD  Blood Glucose Monitoring Suppl (ACCU-CHEK AVIVA PLUS) W/DEVICE KIT Use to check blood sugar 2 times per day dx code E11.65 10/23/14  Yes Elayne Snare, MD  carvedilol (COREG) 12.5 MG tablet Take 1 tablet (12.5 mg total) by mouth 2 (two) times daily with a meal. 08/19/14  Yes Darlin Coco, MD  Cholecalciferol (VITAMIN D PO) Take 5,000 Units by mouth daily. Taking 5000 daily   Yes Historical Provider, MD  COMBIGAN 0.2-0.5 % ophthalmic solution Place 1 drop into the right eye daily.  04/09/13  Yes Historical Provider, MD  Cyanocobalamin (VITAMIN B-12 PO) Take 1 tablet by mouth daily.   Yes Historical Provider, MD  diltiazem (TIAZAC) 360 MG 24 hr capsule Take 360 mg by mouth daily.     Yes Historical Provider, MD  docusate sodium (COLACE) 100 MG capsule Take 100 mg by mouth daily.    Yes Historical Provider, MD  glucose blood (ACCU-CHEK AVIVA PLUS) test strip Use as instructed to check blood sugar 2 times per day dx code E11.65 10/23/14  Yes Elayne Snare, MD  guaiFENesin (ROBITUSSIN) 100 MG/5ML liquid Take 200 mg by mouth 3 (three) times daily as needed for cough.   Yes Historical Provider, MD  insulin aspart (NOVOLOG FLEXPEN) 100 UNIT/ML FlexPen Inject 12 units 3 times per day with meals 12/05/14  Yes Elayne Snare, MD  Insulin Detemir (LEVEMIR) 100 UNIT/ML Pen Inject 12 Units into the skin daily at 10 pm.   Yes Historical Provider, MD  Insulin Pen Needle (B-D UF III MINI PEN NEEDLES) 31G X 5 MM MISC 1 each by Does not apply route 2 (two) times daily. 04/16/14  Yes Elayne Snare, MD   Liraglutide 18 MG/3ML SOPN Inject 1.8 mg into the skin daily. 06/09/14  Yes Elayne Snare, MD  megestrol (MEGACE) 400 MG/10ML suspension Take 10 mLs (400 mg total) by mouth 2 (two) times daily. 05/18/15  Yes Wyatt Portela, MD  oxyCODONE-acetaminophen (PERCOCET/ROXICET) 5-325 MG tablet Take 1 tablet by mouth every 4 (four) hours as needed for moderate pain or severe pain. 05/30/15  Yes Florencia Reasons, MD  pioglitazone (ACTOS) 30 MG tablet Take 1 tablet (30 mg total) by mouth daily. 01/19/15  Yes Elayne Snare, MD  prednisoLONE acetate (PRED FORTE) 1 % ophthalmic suspension Place 1 drop into both eyes 2 (two) times daily.  04/25/13  Yes Historical Provider, MD  tamsulosin (FLOMAX) 0.4 MG CAPS capsule Take 0.4 mg by mouth daily.  08/02/13  Yes Historical Provider, MD    Physical Exam: Filed Vitals:   06/18/15 1810 06/18/15 2117 06/19/15 0449 06/19/15 1421  BP: 129/57 116/54 146/64  169/78  Pulse: 116 117 113 113  Temp: 102.7 F (39.3 C) 97.7 F (36.5 C) 97.7 F (36.5 C) 97.6 F (36.4 C)  TempSrc: Oral Oral Oral Oral  Resp: _0 Height:      Weight:      SpO2: 100% 100% 100% 100%    General: Alert, Awake and Oriented to Time, Place and Person. Appear in mild distress Eyes: PERRL ENT: Oral Mucosa clear moist. Neck: Difficult to assess JVD Cardiovascular: S1 and S2 Present, no Murmur, Peripheral Pulses Present Respiratory: Bilateral Air entry equal and Decreased,  Left basal Crackles, no wheezes Abdomen: Bowel Sound present, Soft and mild tenderness, incision appears well-healing Skin: no Rash Extremities: Bilateral Pedal edema, no calf tenderness Neurologic: Grossly no focal neuro deficit.  Labs:  CBC:  Recent Labs Lab 06/14/15 0520 06/15/15 0500 06/16/15 0421 06/17/15 0425 06/19/15 0517  WBC 7.8 6.2 5.0 5.4 9.3  NEUTROABS  --   --   --  3.8 7.0  HGB 9.0* 8.8* 8.6* 8.6* 8.6*  HCT 27.4* 27.2* 26.9* 26.3* 28.3*  MCV 89.0 88.9 88.8 89.5 93.7  PLT 322 337 344 319 497   Basic  Metabolic Panel:  Recent Labs Lab 06/15/15 0500 06/16/15 0421 06/17/15 0425 06/18/15 0330 06/19/15 0517  NA 145 143 146* 143 140  K 3.4* 3.5 3.7 3.7 4.0  CL 115* 116* 117* 116* 113*  CO2 20* 20* 22 23 17*  GLUCOSE 118* 142* 163* 182* 187*  BUN _1 CREATININE 0.90 0.80 0.76 0.72 0.92  CALCIUM 6.9* 6.6* 6.9* 6.6* 6.5*  MG 1.6* 2.0 1.9 1.8 1.8  PHOS  --   --  1.0* 1.9* 2.5   Liver Function Tests:  Recent Labs Lab 06/16/15 0421 06/17/15 0425 06/18/15 0330  AST _2 ALT 13* 11* 11*  ALKPHOS 290* 267* 346*  BILITOT 0.6 0.5 0.8  PROT 5.2* 5.2* 5.0*  ALBUMIN 1.8* 1.7* 1.7*   No results for input(s): LIPASE, AMYLASE in the last 168 hours. No results for input(s): AMMONIA in the last 168 hours.  Cardiac Enzymes:  Recent Labs Lab 06/19/15 1120  TROPONINI 0.04*   No results for input(s): PROBNP in the last 8760 hours.  CBG:  Recent Labs Lab 06/18/15 2114 06/19/15 0026 06/19/15 0443 06/19/15 0730 06/19/15 1201  GLUCAP 142* 182* 173* 174* 238*    Radiological Exams: Dg Chest 2 View  06/19/2015  CLINICAL DATA:  Fever and weakness EXAM: CHEST  2 VIEW COMPARISON:  Four days ago FINDINGS: Persistent opacity at the left base with elevated diaphragm. Right upper extremity PICC has been placed, tip at the SVC. Normal heart size for technique. Stable upper mediastinal contours. No pulmonary edema or pleural effusion. IMPRESSION: 1. Persistent left basilar atelectasis. Given fever history, note that there could be superimposed pneumonia. 2. New right upper extremity PICC is in good position. Electronically Signed   By: Monte Fantasia M.D.   On: 06/19/2015 10:50    EKG: Independently reviewed. sinus tachycardia.  Assessment/Plan Principal Problem:   SBO (small bowel obstruction) (HCC) Active Problems:   Prostate cancer ([place])   Anemia   HCAP (healthcare-associated pneumonia)   Sinus tachycardia (HCC)   S/P exploratory laparotomy   Pedal edema    Generalized weakness   Metabolic acidosis   Type 2 diabetes mellitus ([place])  1. Sinus tachycardia, healthcare associated pneumonia., Metabolic acidosis.  Patient presented initially with complaints of abdominal pain. X-ray of the chest on  admission was concerning for a possible left basal infiltrate. Today's repeat x-ray shows persistent left basal infiltrate with him having fever and tachycardia I will start the patient on broad-spectrum antibiotics due to recurrent hospitalization as well as recent procedure. Patient will be started on vancomycin and Zosyn, follow the cultures as well as further workup. We will check CT of the chest to rule out any located to infection and the location. CT of the chest will help so help to identify the possibility of PE in a patient with history of prostate cancer, sinus tachycardia as well as bilateral leg edema. Check ultrasound of the lower extremity to rule out DVT as well. Check echocardiogram provided by systolic function. EKG as well as initial troponin not consistent with any acute ischemia. Continue IV Lopressor at present until the workup is completed.  We increase dose once we have further information about etiology.  2. Chronic anemia. Recent GI bleed. The patient recently was presented with complaints of GI bleed as well in last admission. Workup was negative as per documentation. Currently H&H remained stable. We will need to continue monitoring his H&H and transfuse when necessary for hemoglobin less than 7.  3. Small bowel obstruction, status post x-ray laparotomy. Surgery is considering CT scan of the abdomen and pelvis to identify any intra-abdominal abscess. Management after surgery. Patient will be covered with IV Zosyn.  4. Type 2 diabetes mellitus. Patient is on insulin and sliding scale continue close monitoring.  5. History of prostate cancer, castration resistant. Patient on Zytiga at home. Currently on hold due to ongoing  surgical and medical issues.  6. Malnutrition. Albumin level less than 2, prealbumin less than 10. Patient is on TPN per pharmacy while the patient is being nothing by mouth. Management per surgery.  Family Communication: Family was present at bedside and all questions were answered satisfactorily.   Primary team communication: Discussed with Mr. Earnstine Regal, [place].  Thank you very much for involving [place] in care of your patient.  We will continue to follow the patient.   Author: Berle Mull, MD [facility] Pager: [phone] 06/19/2015 2:51 PM    If 7PM-7AM, please contact night-coverage [email or web address] Password [email or web address]

## 2015-06-19 NOTE — Progress Notes (Signed)
PICC purple port occluded.  Red port infuses but no blood return.  Attempts made to free port with TPA unsuccessful.  PICC pulled back 1 cm and dressing change to make sure PICC not kinked unsuccessful.  Unable to flush or pull back with purple port.  Floor RN to notify MD for further orders.

## 2015-06-19 NOTE — Progress Notes (Signed)
Peripherally Inserted Central Catheter/Midline Placement  The IV Nurse has discussed with the patient and/or persons authorized to consent for the patient, the purpose of this procedure and the potential benefits and risks involved with this procedure.  The benefits include less needle sticks, lab draws from the catheter and patient may be discharged home with the catheter.  Risks include, but not limited to, infection, bleeding, blood clot (thrombus formation), and puncture of an artery; nerve damage and irregular heat beat.  Alternatives to this procedure were also discussed.  PICC/Midline Placement Documentation  PICC Double Lumen [id number] PICC Right Basilic 41 cm 0 cm (Active)  Indication for Insertion or Continuance of Line Vasoactive infusions 06/19/2015 11:31 AM  Exposed Catheter (cm) 2 cm 06/19/2015 11:31 AM  Site Assessment Clean;Dry;Intact 06/19/2015 11:31 AM  Lumen #1 Status Infusing;Flushed;Saline locked;Blood return noted 06/19/2015 11:31 AM  Lumen #2 Status Occluded 06/19/2015 11:31 AM  Dressing Type Transparent 06/19/2015 11:31 AM  Dressing Status Clean;Dry;Intact 06/19/2015 11:31 AM  Line Care Cap(s) changed 06/18/2015  9:03 PM  Line Adjustment (NICU/IV Team Only) No 06/16/2015  4:17 PM  Dressing Intervention Dressing changed 06/19/2015 11:31 AM  Dressing Change Due 06/26/15 06/19/2015 11:31 AM   Please note previous consent   Synthia Innocent 06/19/2015, 2:55 PM

## 2015-06-19 NOTE — Progress Notes (Signed)
PARENTERAL NUTRITION CONSULT NOTE - Follow Up  Pharmacy Consult for TPN Indication: Prolonged ileus  No Known Allergies  Patient Measurements: Height: 6' (182.9 cm) Weight: 210 lb 1.6 oz (95.3 kg) IBW/kg (Calculated) : 77.6 Adjusted Body Weight: 82.4 kg Usual Weight:  95.3 kg  Vital Signs: Temp: 97.7 F (36.5 C) (12/09 0449) Temp Source: Oral (12/09 0449) BP: 146/64 mmHg (12/09 0449) Pulse Rate: 113 (12/09 0449) Intake/Output from previous day: 12/08 0701 - 12/09 0700 In: 240 [P.O.:240] Out: 550 [Urine:550] Intake/Output from this shift:    Labs:  Recent Labs  06/17/15 0425 06/19/15 0517  WBC 5.4 9.3  HGB 8.6* 8.6*  HCT 26.3* 28.3*  PLT 319 266     Recent Labs  06/17/15 0425 06/18/15 0330 06/19/15 0517  NA 146* 143 140  K 3.7 3.7 4.0  CL 117* 116* 113*  CO2 22 23 17*  GLUCOSE 163* 182* 187*  BUN 10 10 14   CREATININE 0.76 0.72 0.92  CALCIUM 6.9* 6.6* 6.5*  MG 1.9 1.8 1.8  PHOS 1.0* 1.9* 2.5  PROT 5.2* 5.0*  --   ALBUMIN 1.7* 1.7*  --   AST 17 31  --   ALT 11* 11*  --   ALKPHOS 267* 346*  --   BILITOT 0.5 0.8  --   PREALBUMIN 4.4*  --   --   TRIG 157*  --   --    Estimated Creatinine Clearance: 80.6 mL/min (by C-G formula based on Cr of 0.92).    Recent Labs  06/19/15 0026 06/19/15 0443 06/19/15 0730  GLUCAP 182* 173* 174*    Medications:  Scheduled:  . sodium chloride  10 mL/hr Intravenous Once  . heparin subcutaneous  5,000 Units Subcutaneous 3 times per day  . insulin aspart  0-15 Units Subcutaneous 6 times per day  . insulin glargine  10 Units Subcutaneous Q2200  . metoprolol  5 mg Intravenous 4 times per day  . pantoprazole (PROTONIX) IV  40 mg Intravenous QHS  . prednisoLONE acetate  1 drop Both Eyes BID   Infusions:  . 0.45 % NaCl with KCl 20 mEq / L 25 mL/hr at 06/17/15 1704  . Marland KitchenTPN (CLINIMIX-E) Adult 75 mL/hr at 06/18/15 1824   And  . fat emulsion 240 mL (06/18/15 1824)   PRN: acetaminophen **OR** acetaminophen,  albuterol, bisacodyl, diphenhydrAMINE **OR** diphenhydrAMINE, menthol-cetylpyridinium, morphine injection, ondansetron **OR** ondansetron (ZOFRAN) IV, phenol, sodium chloride   Insulin Requirements in past 24 hours:  10 unit Novolog SSI yesterday Lantus 10 units qHS  Current Nutrition: NPO  IVF: 1/2 NS with 20KCl at 25 ml/hr  Central access: PICC placed 12/6 TPN start date: 12/6  ASSESSMENT                                                                                                          HPI: 79 year old male with widely metastatic prostate cancer who presented 12/1 with abdominal pain nausea and vomiting. CT scan has shown evidence of small bowel obstruction with possibly a closed loop obstruction. Follow-up x-ray showed no  improvement in small bowel obstruction and underwent emergency laparotomy with small bowel resection on 12/2.  Has now developed SBO with internal hernia and has been managed conservatively. Today, 12/6, CCS has consulted pharmacy to begin TPN for prolonged ileus.   Significant events:   Today: 06/19/2015  Glucose: continues to increase as TPN rate increases  Hx DM2, home regimen - Levemir 12 units qHS, Novolog 12 units TIDWM, Actos 30mg  daily, Victoza 1.8mg  daily.  Current regimen - Lantus 10 units qHS, Novolog SSI moderate scale  Electrolytes:   Phos low but improved to 2.5, post replacement yesterday  Ca low  = 6.5, corrects to 8.3 with albumin 1.7 - has required almost daily supplementation.   Na WNL, Cl slightly elevated  K+ WNL  Mg low end of normal, post replacement yesterday  Renal: SCr 0.92, CrCl 80.6 ml/lmin  LFTs: AlkPhos elevated, others wnl (12/8)  TGs: 157 (12/7) Prealbumin: low at 3.5 (12/6),4.4 (12/7) NUTRITIONAL GOALS                                                                                             RD recs (12/2): Kcal: 2300-2500, Protein: 115-130 grams, Fluid: 2.2 L/day Clinimix E 5/15 at a goal rate of 173ml/hr +  20% fat emulsion at 57ml/hr to provide: 120g/day protein, 2184Kcal/day.  PLAN                                                                                                                          Potassium phosphate 10Mmol IV x 1  Calcium Gluconate 1gm IV x 1 Magnesium sulfate 1gm IV x 1  At 1800 today:  Increase Clinimix E 5/15 to 1102ml/hr. (goal)  20% fat emulsion at 43ml/hr.  TPN to contain standard multivitamins and trace elements.  Continue 1/2NS with 20KCl @ 4mls/hr  Continue moderate SSI q4h   BMet with mag and phos tomorrow   TPN lab panels on Mondays & Thursdays.  F/u daily.  Dolly Rias RPh 06/19/2015, 8:14 AM Pager [phone]

## 2015-06-19 NOTE — Progress Notes (Signed)
7 Days Post-Op  Subjective: He is up in chair, but not very active, not moving much still.  He has a fever again, abdomen is sore but not really tender, + BS, no Bm.  I checked myself, temp 102.4, HR 131, ST on telem, BP 136/60, 100% sat on RA  Objective: Vital signs in last 24 hours: Temp:  [97.7 F (36.5 C)-102.7 F (39.3 C)] 97.7 F (36.5 C) (12/09 0449) Pulse Rate:  [113-124] 113 (12/09 0449) Resp:  [16-20] 18 (12/09 0449) BP: (116-146)/(54-66) 146/64 mmHg (12/09 0449) SpO2:  [98 %-100 %] 100 % (12/09 0449) Last BM Date: 06/05/15   240 PO recorded Urine 550 recorded NO Bm recorded TM 102.7 at 1500 and then afebrile since that time  VSS Labs this AM show glucose up but otherwise normal.  WBC 9.3 UA shows 6-30 WBC per HPF No film  Intake/Output from previous day: 12/08 0701 - 12/09 0700 In: 240 [P.O.:240] Out: 550 [Urine:550] Intake/Output this shift:    General appearance: alert, cooperative, fatigued and no distress Resp: rales both bases, CXR pending GI: soft, sore, incision looks fine, no BM, + BS. Extremities: edema +1-2 in legs.    Lab Results:   Recent Labs  06/17/15 0425 06/19/15 0517  WBC 5.4 9.3  HGB 8.6* 8.6*  HCT 26.3* 28.3*  PLT 319 266    BMET  Recent Labs  06/18/15 0330 06/19/15 0517  NA 143 140  K 3.7 4.0  CL 116* 113*  CO2 23 17*  GLUCOSE 182* 187*  BUN 10 14  CREATININE 0.72 0.92  CALCIUM 6.6* 6.5*   PT/INR No results for input(s): LABPROT, INR in the last 72 hours.   Recent Labs Lab 06/16/15 0421 06/17/15 0425 06/18/15 0330  AST 19 17 31   ALT 13* 11* 11*  ALKPHOS 290* 267* 346*  BILITOT 0.6 0.5 0.8  PROT 5.2* 5.2* 5.0*  ALBUMIN 1.8* 1.7* 1.7*     Lipase     Component Value Date/Time   LIPASE 46 11/09/2014 1137     Studies/Results: No results found.  Medications: . sodium chloride  10 mL/hr Intravenous Once  . calcium gluconate  1 g Intravenous Once  . heparin subcutaneous  5,000 Units Subcutaneous 3 times  per day  . insulin aspart  0-15 Units Subcutaneous 6 times per day  . insulin glargine  10 Units Subcutaneous Q2200  . magnesium sulfate 1 - 4 g bolus IVPB  1 g Intravenous Once  . metoprolol  5 mg Intravenous 4 times per day  . pantoprazole (PROTONIX) IV  40 mg Intravenous QHS  . potassium phosphate IVPB (mmol)  10 mmol Intravenous Once  . prednisoLONE acetate  1 drop Both Eyes BID    Assessment/Plan Small bowel obstruction with internal hernia S/p exploratory laparotomy with SBO, 06/12/15, Dr. [name] Sepsis, most likely UTI Post op ileus Hx of GI bleed 11/16-11/19/16 ([facility]) Post op anemia, transfused Tachycardia - On Coreg and Diltiazem at home per list above/discharge 05/30/15 (mostly sinus with PVC's) AODM Insulin dependant Prostate cancer ongoing treatment Hypertension Hyperlipidemia  Malnutrition with prealbumin 3.5 06/16/15.  Antibiotics: None DVT: SCD/heparin    Plan:  He is getting an CXR now, I will get a CT later this AM after he has had some oral contrast.  Urine culture and start Ceftriaxone.  Keep him NPO except for the contrast till we get the CT.  Transfer to stepdown unit so we can watch him closer.  Ask Medicine to see and  help with care.  LOS: 8 days    Jeremy Jennings 06/19/2015

## 2015-06-19 NOTE — Progress Notes (Signed)
Nutrition Follow-up  DOCUMENTATION CODES:   Not applicable  INTERVENTION:  - Continue TPN/TPN advancement per pharmacy - RD will continue to monitor for needs  NUTRITION DIAGNOSIS:   Inadequate oral intake related to inability to eat as evidenced by NPO status. -ongoing  GOAL:   Patient will meet greater than or equal to 90% of their needs -unmet with current TPN rate  MONITOR:   Weight trends, Labs, I & O's, Other (Comment) (TPN regimen)  ASSESSMENT:   79 yo black male who has a history of stage 4 prostate cancer, HTN, DM, tachycardia, recent GI bleed. He had an endoscopy at that time of admission but apparently deferred an inpatient colonoscopy. He just had his colonoscopy done by Dr. Watt Climes on Tuesday. By report, this was normal with no source of bleeding identified. He denies any further bloody BMs sin79 yo black male who has a history of stage 4 prostate cancer, HTN, DM, tachycardia, recent GI bleed. He had an endoscopy at that time of admission but apparently deferred an inpatient colonoscopy. He just had his colonoscopy done by Dr. Watt Climes on Tuesday. By report, this was normal with no source of bleeding identified. He denies any further bloody BMs since this admission. This morning around 6 or 7 am, the patient began having centralized crampy abdominal pain. He denies nausea or vomiting, but admits to no flatus. He deals with constipation on a regular basis. Due to persistent pain, the patient presented to [facility] for further evaluation.ce this admission. This morning around 6 or 7 am, the patient began having centralized crampy abdominal pain. He denies nausea or vomiting, but admits to no flatus. He deals with constipation on a regular basis. Due to persistent pain, the patient presented to [facility] for further evaluation.  12/9 Pt resting during lab draw at time of RD visit. Pt remains NPO with notes stating plan for CT with contrast today. No intakes documented from when  pt was on CLD; will monitor for ongoing diet advancement and tolerance.   Pt currently receiving Clinimix E 5/15 @ 75 mL/hr with 20% lipids @ 10 mL/hr. This regimen is providing 1758 kcal (76% minimum estimated kcal needs), 90 grams protein (78% minimum estimated protein needs). Pharmacy note indicates plan to advance to goal rate of TPN today at 1800.   Goal rate: Clinimix E 5/15 @ 100 mL/hr with 20% lipids @ 10 mL/hr which will provide 2184 kcal (95% minimum estimated kcal needs), 120 grams protein (100% estimated protein needs).   RD will continue to follow per protocol. Pt continues without a BM at this time. Medications reviewed. Labs reviewed; CBGs: 94-192 mg/dL, Cl: 113 mmol/L, Ca: 6.5 mg/dL.    12/6 - Pt has been NPO since admission.  - NGT in place with ~900cc drainage present at time of RD visit; POD #4 ex lap.  - Pt denies abdominal pain or nausea at this time.  - He states that PTA he had a fair appetite and would typically eat a light breakfast and a large dinner and would often skip lunch. - He denies nausea or pain with eating PTA.  - He indicates that he has been on oral chemo and believes that he experienced taste alteration due to this but is unable to specify how foods tasted different than they had prior to starting medication.  - He states that he has had chronic constipation and that he is concerned about diet advancement being held due to inability to have a bowel movement.  -  Consult received for new TPN; will monitor for TPN initiation and ability for surgical team to advance diet.  - Pt reports UBW of 210 lbs and that he was stable at this weight for "awhile" PTA.  - He feels he has lost weight since admission and states that his collar bone appears more prominent than it previously had.  - No muscle or fat wasting noted during physical assessment. - Pt states he was typically drinking 1 carton of Premier Protein a day. This supplement provides 100 kcal and 30 grams of  protein.  Pharmacy note today at 1111: At 1800 today:  Start Clinimix E 5/15 at 94ml/hr.  20% fat emulsion at 10ml/hr.  Plan to advance as tolerated to the goal rate.  TPN to contain standard multivitamins and trace elements.  Reduce IVF to 25 ml/hr  Continue SSI sensitive scale q4h .   TPN lab panels on Mondays & Thursdays. Plan for goal rate of TPN: Clinimix E 5/15 @ 100 mL/hr with 20% lipids @ 10 mL/hr to provide 2184 kcal (95% minimum estimated kcal needs), 120 gram protein   12/2 - He has been NPO since admission and is currently out of the room to OR for laparotomy related to SBO.  - Unable to obtain information from pt and no family or visitors present at this time.  - Pt was seen by RD 11/17 and was stated to be severely malnourished at that time; will monitor for current states once able to obtain information concerning PO intakes PTA and ability to perform physical assessment.  - Per chart review, pt lost 5 lbs from 05/01/15-05/18/15 and then subsequently gained 4 lbs from 05/18/15-05/29/15. - Current weight of 210 lbs is the same as weight 05/29/15.   Diet Order:  TPN (CLINIMIX-E) Adult TPN (CLINIMIX-E) Adult Diet NPO time specified Except for: Ice Chips, Sips with Meds  Skin:  Wound (see comment) (Abdominal incision)  Last BM:  PTA  Height:   Ht Readings from Last 1 Encounters:  06/11/15 6' (1.829 m)    Weight:   Wt Readings from Last 1 Encounters:  06/11/15 210 lb 1.6 oz (95.3 kg)    Ideal Body Weight:  80.91 kg (kg)  BMI:  Body mass index is 28.49 kg/(m^2).  Estimated Nutritional Needs:   Kcal:  [id number]  Protein:  115-130 grams  Fluid:  2.2 L/day  EDUCATION NEEDS:   No education needs identified at this time     Jarome Matin, RD, LDN Inpatient Clinical Dietitian Pager # [phone] After hours/weekend pager # [phone]

## 2015-06-19 NOTE — Care Management Important Message (Signed)
Important Message  Patient Details  Name: JALIK PHARO MRN: [id number] Date of Birth: Oct 14, 1935   Medicare Important Message Given:  Yes    Camillo Flaming 06/19/2015, 12:06 [place] Message  Patient Details  Name: MARVION MICHALAK MRN: [id number] Date of Birth: 1935-09-28   Medicare Important Message Given:  Yes    Camillo Flaming 06/19/2015, 12:05 PM

## 2015-06-19 NOTE — Progress Notes (Signed)
  Echocardiogram 2D Echocardiogram has been performed.  Tresa Res 06/19/2015, 3:29 PM

## 2015-06-19 NOTE — Progress Notes (Signed)
VASCULAR LAB PRELIMINARY  PRELIMINARY  PRELIMINARY  PRELIMINARY  Bilateral lower extremity venous duplex  completed.    Preliminary report:  Bilateral:  No obvious evidence of DVT, superficial thrombosis, or Baker's Cyst.    Imad Shostak, RVT 06/19/2015, 3:32 PM

## 2015-06-20 ENCOUNTER — Encounter (HOSPITAL_COMMUNITY): Payer: Self-pay | Admitting: Cardiology

## 2015-06-20 DIAGNOSIS — I119 Hypertensive heart disease without heart failure: Secondary | ICD-10-CM

## 2015-06-20 LAB — GLUCOSE, CAPILLARY
GLUCOSE-CAPILLARY: 220 mg/dL — AB (ref 65–99)
GLUCOSE-CAPILLARY: 207 mg/dL — AB (ref 65–99)
GLUCOSE-CAPILLARY: 257 mg/dL — AB (ref 65–99)
Glucose-Capillary: 222 mg/dL — ABNORMAL HIGH (ref 65–99)
Glucose-Capillary: 226 mg/dL — ABNORMAL HIGH (ref 65–99)
Glucose-Capillary: 258 mg/dL — ABNORMAL HIGH (ref 65–99)

## 2015-06-20 LAB — BASIC METABOLIC PANEL
ANION GAP: 6 (ref 5–15)
BUN: 17 mg/dL (ref 6–20)
CHLORIDE: 111 mmol/L (ref 101–111)
CO2: 21 mmol/L — AB (ref 22–32)
CREATININE: 0.9 mg/dL (ref 0.61–1.24)
Calcium: 6.1 mg/dL — CL (ref 8.9–10.3)
GFR calc non Af Amer: >60 mL/min (ref >60)
GLUCOSE: 263 mg/dL — AB (ref 65–99)
Potassium: 3.6 mmol/L (ref 3.5–5.1)
Sodium: 138 mmol/L (ref 135–145)

## 2015-06-20 LAB — PHOSPHORUS: PHOSPHORUS: 2.6 mg/dL (ref 2.5–4.6)

## 2015-06-20 LAB — CBC
HCT: 21.5 % — ABNORMAL LOW (ref 39.0–52.0)
HEMATOCRIT: 26.6 % — AB (ref 39.0–52.0)
HEMOGLOBIN: 7 g/dL — AB (ref 13.0–17.0)
HEMOGLOBIN: 7.7 g/dL — AB (ref 13.0–17.0)
MCH: 29 pg (ref 26.0–34.0)
MCH: 30.4 pg (ref 26.0–34.0)
MCHC: 32.6 g/dL (ref 30.0–36.0)
MCHC: 28.9 g/dL — AB (ref 30.0–36.0)
MCV: 89.2 fL (ref 78.0–100.0)
MCV: 105.1 fL — ABNORMAL HIGH (ref 78.0–100.0)
Platelets: 219 10*3/uL (ref 150–400)
Platelets: 196 10*3/uL (ref 150–400)
RBC: 2.41 MIL/uL — AB (ref 4.22–5.81)
RBC: 2.53 MIL/uL — ABNORMAL LOW (ref 4.22–5.81)
RDW: 16.8 % — ABNORMAL HIGH (ref 11.5–15.5)
RDW: 18.8 % — ABNORMAL HIGH (ref 11.5–15.5)
WBC: 10.3 10*3/uL (ref 4.0–10.5)
WBC: 9.8 10*3/uL (ref 4.0–10.5)

## 2015-06-20 LAB — MAGNESIUM: MAGNESIUM: 1.8 mg/dL (ref 1.7–2.4)

## 2015-06-20 LAB — PREPARE RBC (CROSSMATCH)

## 2015-06-20 MED ORDER — SODIUM CHLORIDE 0.9 % IV SOLN
Freq: Once | INTRAVENOUS | Status: AC
  Administered 2015-06-20: 08:00:00 via INTRAVENOUS

## 2015-06-20 MED ORDER — LEVALBUTEROL HCL 0.63 MG/3ML IN NEBU
0.6300 mg | INHALATION_SOLUTION | Freq: Three times a day (TID) | RESPIRATORY_TRACT | Status: DC
  Administered 2015-06-20 – 2015-06-21 (×5): 0.63 mg via RESPIRATORY_TRACT
  Filled 2015-06-20 (×6): qty 3

## 2015-06-20 MED ORDER — INSULIN GLARGINE 100 UNIT/ML ~~LOC~~ SOLN
10.0000 [IU] | Freq: Two times a day (BID) | SUBCUTANEOUS | Status: DC
  Administered 2015-06-20 – 2015-06-22 (×5): 10 [IU] via SUBCUTANEOUS
  Filled 2015-06-20 (×5): qty 0.1

## 2015-06-20 MED ORDER — METOPROLOL TARTRATE 1 MG/ML IV SOLN
10.0000 mg | Freq: Four times a day (QID) | INTRAVENOUS | Status: DC
  Administered 2015-06-20 – 2015-06-22 (×8): 10 mg via INTRAVENOUS
  Filled 2015-06-20 (×8): qty 10

## 2015-06-20 MED ORDER — ALBUTEROL SULFATE (2.5 MG/3ML) 0.083% IN NEBU: 2.5000 mg | INHALATION_SOLUTION | Freq: Two times a day (BID) | RESPIRATORY_TRACT | Status: DC

## 2015-06-20 MED ORDER — METHOCARBAMOL 1000 MG/10ML IJ SOLN
500.0000 mg | Freq: Three times a day (TID) | INTRAVENOUS | Status: DC | PRN
  Administered 2015-06-20 – 2015-07-02 (×4): 500 mg via INTRAVENOUS
  Filled 2015-06-20 (×9): qty 5

## 2015-06-20 MED ORDER — FAT EMULSION 20 % IV EMUL
240.0000 mL | INTRAVENOUS | Status: AC
  Administered 2015-06-20: 240 mL via INTRAVENOUS
  Filled 2015-06-20: qty 250

## 2015-06-20 MED ORDER — SODIUM CHLORIDE 0.9 % IV SOLN
1.0000 g | Freq: Once | INTRAVENOUS | Status: AC
  Administered 2015-06-20: 1 g via INTRAVENOUS
  Filled 2015-06-20: qty 10

## 2015-06-20 MED ORDER — M.V.I. ADULT IV INJ
INJECTION | INTRAVENOUS | Status: AC
  Administered 2015-06-20: 18:00:00 via INTRAVENOUS
  Filled 2015-06-20: qty 2000

## 2015-06-20 NOTE — Consult Note (Signed)
Primary cardiologist: Dr. Darlin Coco Consulting cardiologist: Dr. Satira Sark  Reason for consultation: Tachycardia  Clinical Summary Jeremy Jennings is a 79 y.o.male with past medical history outlined below, currently admitted to the hospital with small bowel obstruction, underwent exploratory laparotomy with lysis of adhesions on December 2. He has had a postoperative ileus. Also recently febrile up to 102 - 103, has been seen by internal medicine and being treated with broad-spectrum antibiotics for possible associated pneumonia. He has had follow-up abdominal imaging consistent with ileus, no obvious acute abdominal findings. Also bilateral pleural effusions and possible associated infiltrate. He is currently nothing by mouth and has been placed on IV Lopressor which was increased earlier today.  Echocardiogram performed on December 9 revealed moderate LVH with mid cavity obliteration and vigorous LVEF of 75%, mild tricuspid regurgitation, and PASP 34 mmHg.  History includes prior documentation of frequent PACs, no definite atrial fibrillation. Based on chart review his heart rate looks to be in the 90s as an outpatient, although I did see some levels between the 100 - 115 bpm. As an outpatient he is typically on diltiazem CD 360 mg daily as well as Coreg 12.5 mg twice daily.  Does not report any chest pain or shortness of breath at this time. Mild abdominal discomfort. Also reports having progressive leg edema over the last few months.   No Known Allergies  Medications Scheduled Medications: . heparin subcutaneous  5,000 Units Subcutaneous 3 times per day  . insulin aspart  0-15 Units Subcutaneous 6 times per day  . insulin glargine  10 Units Subcutaneous BID  . levalbuterol  0.63 mg Nebulization Q8H  . metoprolol  10 mg Intravenous 4 times per day  . pantoprazole (PROTONIX) IV  40 mg Intravenous QHS  . piperacillin-tazobactam (ZOSYN)  IV  3.375 g Intravenous Q8H  .  prednisoLONE acetate  1 drop Both Eyes BID  . vancomycin  1,250 mg Intravenous Q12H    Infusions: . Marland KitchenTPN (CLINIMIX-E) Adult 100 mL/hr at 06/19/15 1755   And  . fat emulsion 240 mL (06/19/15 1754)  . Marland KitchenTPN (CLINIMIX-E) Adult     And  . fat emulsion      PRN Medications: acetaminophen **OR** acetaminophen, bisacodyl, diphenhydrAMINE **OR** diphenhydrAMINE, menthol-cetylpyridinium, methocarbamol (ROBAXIN)  IV, morphine injection, ondansetron **OR** ondansetron (ZOFRAN) IV, phenol, sodium chloride   Past Medical History  Diagnosis Date  . Essential hypertension   . Type 2 diabetes mellitus ([place])   . Hyperlipidemia   . Prostate cancer ([place])   . History of GI bleed   . Tachycardia   . PAC (premature atrial contraction)     Frequent    Past Surgical History  Procedure Laterality Date  . Tonsillectomy    . Hemorroidectomy  1969  . Esophagogastroduodenoscopy N/A 05/28/2015    Procedure: ESOPHAGOGASTRODUODENOSCOPY (EGD);  Surgeon: [name], MD;  Location: [name];  Service: Endoscopy;  Laterality: N/A;  . Inguinal hernia repair Bilateral AFE 16-17  . Hernia repair  1981    DR Trezevant  . Laparotomy N/A 06/12/2015    Procedure: EXPLORATORY LAPAROTOMY;  Surgeon: [name], MD;  Location: [facility];  Service: General;  Laterality: N/A;  . Bowel resection N/A 06/12/2015    Procedure: SMALL BOWEL RESECTION;  Surgeon: [name], MD;  Location: [facility];  Service: General;  Laterality: N/A;    Family History  Problem Relation Age of Onset  . Cancer Neg Hx   . Kidney failure Brother   . Kidney  failure Sister     Social History Jeremy Jennings reports that he quit smoking about 35 years ago. His smoking use included Cigarettes. He has a 5 pack-year smoking history. He has never used smokeless tobacco. Jeremy Jennings reports that he does not drink alcohol.  Review of Systems Complete review of systems negative except as otherwise outlined in the clinical  summary.  Physical Examination Blood pressure 145/59, pulse 39, temperature 99.8 F (37.7 C), temperature source Axillary, resp. rate 39, height 6' (1.829 m), weight 220 lb 0.3 oz (99.8 kg), SpO2 97 %.  Intake/Output Summary (Last 24 hours) at 06/20/15 1452 Last data filed at 06/20/15 1342  Gross per 24 hour  Intake 4149.41 ml  Output   1325 ml  Net 2824.41 ml   Telemetry: Sinus tachycardia, occasional PAC and PVC.  Gen.: Patient in no acute distress. HEENT: Conjunctiva and lids normal, oropharynx clear. Neck: Supple, elevated JVP, no carotid bruits, no thyromegaly. Lungs: Diminished breath sounds without crackles, nonlabored breathing at rest. Cardiac: Rapid regular rate and rhythm, no S3, possible S4 but difficult to appreciate with rapid heart rate, no pericardial rub. Abdomen: Soft, nontender, decreased bowel sounds, no guarding or rebound. Extremities: 2+ leg edema, distal pulses 2+. Skin: Warm and dry. Musculoskeletal: No kyphosis. Neuropsychiatric: Alert and oriented x3, affect grossly appropriate.  Lab Results  Basic Metabolic Panel:  Recent Labs Lab 06/16/15 0421 06/17/15 0425 06/18/15 0330 06/19/15 0517 06/20/15 0600  NA 143 146* 143 140 138  K 3.5 3.7 3.7 4.0 3.6  CL 116* 117* 116* 113* 111  CO2 20* 22 23 17* 21*  GLUCOSE 142* 163* 182* 187* 263*  BUN 12 10 10 14 17   CREATININE 0.80 0.76 0.72 0.92 0.90  CALCIUM 6.6* 6.9* 6.6* 6.5* 6.1*  MG 2.0 1.9 1.8 1.8 1.8  PHOS  --  1.0* 1.9* 2.5 2.6    Liver Function Tests:  Recent Labs Lab 06/16/15 0421 06/17/15 0425 06/18/15 0330  AST 19 17 31   ALT 13* 11* 11*  ALKPHOS 290* 267* 346*  BILITOT 0.6 0.5 0.8  PROT 5.2* 5.2* 5.0*  ALBUMIN 1.8* 1.7* 1.7*    CBC:  Recent Labs Lab 06/15/15 0500 06/16/15 0421 06/17/15 0425 06/19/15 0517 06/20/15 0600  WBC 6.2 5.0 5.4 9.3 10.3  NEUTROABS  --   --  3.8 7.0  --   HGB 8.8* 8.6* 8.6* 8.6* 7.0*  HCT 27.2* 26.9* 26.3* 28.3* 21.5*  MCV 88.9 88.8 89.5 93.7  89.2  PLT 337 344 319 266 219    Cardiac Enzymes:  Recent Labs Lab 06/19/15 1120  TROPONINI 0.04*    ECG Tracing from 06/11/2015 showed sinus tachycardia with nonspecific ST-T changes and borderline prolonged QT interval  Imaging Echocardiogram 06/19/2015: Study Conclusions  - Left ventricle: The cavity size was normal. Wall thickness was increased in a pattern of moderate LVH. There is mid-LV cavity systolic obliteration with increased mid-cavitary gradient. The estimated ejection fraction was 75%. The study is not technically sufficient to allow evaluation of LV diastolic function due to E/A fusion. - Left atrium: The atrium was at the upper limits of normal in size. - Tricuspid valve: There was mild regurgitation. - Pulmonary arteries: PA peak pressure: 34 mm Hg (S) + RAP. - Systemic veins: Not visualized.  Impressions:  - LVEF [id number], mid-systolic LV cavitary obliteration, moderate LVH, upper normal LA size, mild TR, RVSP 34 mmHg + RAP (IVC not visualized).  Impression  1. Sinus tachycardia with intermittent ectopy. Most likely multifactorial in  the setting of recent small bowel obstruction with discontinuation of typical oral medications including Coreg and Cardizem CD, recent fevers, progressive anemia, suspected infection currently being managed with broad spectrum antibiotics.  2. Hypertensive heart disease with moderate LVH and vigorous LVEF of 75% with mid cavity obliteration.  3. Small bowel obstruction status post exploratory laparotomy with lysis of adhesions, postoperative day #8. Postoperative ileus as well.  4. History of GI bleed.  5. Essential hypertension, blood pressure mildly elevated at present.   Recommendations  Reviewed records. Discussed with patient and niece in room. Agree with IV Lopressor to try and achieve reasonable heart rate control, although as noted above he has multifactorial reasons that are driving his heart rate  up. He was on fairly high-dose beta blocker and calcium channel blockers as an outpatient which is further contributing, since now nothing by mouth and not on these typical medications. Would continue to address driving causes of tachycardia, and as he is able to start taking things by mouth again, we will assist with transitioning back on an appropriate oral regimen. Follow-up ECG in a.m.  Satira Sark, M.D., F.A.C.C.

## 2015-06-20 NOTE — Progress Notes (Signed)
8 Days Post-Op LOA Subjective: Lying in bed, denies nausea.  Fevers to 103.9 last night  Objective: Vital signs in last 24 hours: Temp:  [97.6 F (36.4 C)-103.9 F (39.9 C)] 100.5 F (38.1 C) (12/10 0732) Pulse Rate:  [112-127] 112 (12/10 0502) Resp:  [20-22] 20 (12/10 0502) BP: (134-169)/(52-81) 142/53 mmHg (12/10 0502) SpO2:  [100 %] 100 % (12/10 1112) Last BM Date: 06/05/15     Intake/Output from previous day: 12/09 0701 - 12/10 0700 In: 3159.4 [I.V.:553.8; IV Piggyback:1150; TPN:1455.7] Out: 1500 [Urine:1500] Intake/Output this shift:    General appearance: alert, cooperative, fatigued and no distress Resp: rales both bases, CXR pending GI: soft, sore, incision looks fine, no BM, + BS. Extremities: edema +1-2 in legs.    Lab Results:   Recent Labs  06/19/15 0517 06/20/15 0600  WBC 9.3 10.3  HGB 8.6* 7.0*  HCT 28.3* 21.5*  PLT 266 219    BMET  Recent Labs  06/19/15 0517 06/20/15 0600  NA 140 138  K 4.0 3.6  CL 113* 111  CO2 17* 21*  GLUCOSE 187* 263*  BUN 14 17  CREATININE 0.92 0.90  CALCIUM 6.5* 6.1*   PT/INR No results for input(s): LABPROT, INR in the last 72 hours.   Recent Labs Lab 06/16/15 0421 06/17/15 0425 06/18/15 0330  AST 19 17 31   ALT 13* 11* 11*  ALKPHOS 290* 267* 346*  BILITOT 0.6 0.5 0.8  PROT 5.2* 5.2* 5.0*  ALBUMIN 1.8* 1.7* 1.7*     Lipase     Component Value Date/Time   LIPASE 46 11/09/2014 1137     Studies/Results: Dg Chest 2 View  06/19/2015  CLINICAL DATA:  Fever and weakness EXAM: CHEST  2 VIEW COMPARISON:  Four days ago FINDINGS: Persistent opacity at the left base with elevated diaphragm. Right upper extremity PICC has been placed, tip at the SVC. Normal heart size for technique. Stable upper mediastinal contours. No pulmonary edema or pleural effusion. IMPRESSION: 1. Persistent left basilar atelectasis. Given fever history, note that there could be superimposed pneumonia. 2. New right upper extremity PICC is  in good position. Electronically Signed   By: Monte Fantasia M.D.   On: 06/19/2015 10:50   Ct Angio Chest Pe W/cm &/or Wo Cm  06/19/2015  CLINICAL DATA:  Inpatient hospitalized with central abdominal cramping and constipation. Post laparotomy and bowel resection 1 week prior. Today with fever and tachycardia. Patient with history of prostate cancer with bony metastasis. EXAM: CT ANGIOGRAPHY CHEST CT ABDOMEN AND PELVIS WITH CONTRAST TECHNIQUE: Multidetector CT imaging of the chest was performed using the standard protocol during bolus administration of intravenous contrast. Multiplanar CT image reconstructions and MIPs were obtained to evaluate the vascular anatomy. Multidetector CT imaging of the abdomen and pelvis was performed using the standard protocol during bolus administration of intravenous contrast. CONTRAST:  155mL OMNIPAQUE IOHEXOL 350 MG/ML SOLN COMPARISON:  Chest radiographs earlier this day. CT abdomen/pelvis 06/11/2015 FINDINGS: CTA CHEST FINDINGS Vascular: Suboptimal contrast bolus for evaluation of pulmonary embolus, there are no filling defects in the main, right, left, or lobar pulmonary arteries. Segmental and subsegmental branches cannot be assessed, particularly in the lower lobes where there is motion artifact. Normal caliber thoracic aorta. 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 Minimal multi chamber cardiomegaly with coronary artery calcifications. Lungs/pleura: Small bilateral pleural effusions, unchanged on the right and increased on the left from prior abdominal CT. There is adjacent compressive atelectasis at both  lung bases, left greater than right. Air bronchograms on the left, with question of superimposed pneumonia. Breathing motion artifact partially obscures detailed lung evaluation. Mediastinum/lymph nodes: Prominent AP window lymph node measures 1.9 cm short axis dimension. There is otherwise no mediastinal or hilar  adenopathy. No pericardial effusion. There is a right upper extremity PICC, with tip in the distal SVC. Enteric contrast within the esophagus which is mildly tortuous. Osseous structures: Multiple sclerotic foci throughout bilateral ribs, thoracic spine, and sternum consistent with known osseous metastatic disease. CT ABDOMEN and PELVIS FINDINGS Liver: No focal hepatic lesion. Hepatobiliary: Gallbladder physiologically distended. No calcified gallstone. Pancreas: No ductal dilatation or inflammation. Spleen: Normal. Adrenal glands: No nodule. Kidneys: Symmetric renal enhancement and excretion. No hydronephrosis. Bilateral renal cysts again seen. Stomach/Bowel: Stomach physiologically distended with enteric contrast, there is wall thickening involving the fundus. Fat density lesion involving the distal gastric body measures 1.6 x 1.4 cm, unchanged, query intramural lipoma. Additional fat density lesion in the region of pylorus is also unchanged. Contrast filled prominent proximal small bowel loops without transition point. Fluid-filled and distal small bowel loops, with enteric chain sutures in the right lower quadrant. Colon is tortuous with colonic diverticulosis, no diverticulitis. Appendix remains in situ. Vascular/Lymphatic: No retroperitoneal adenopathy. Abdominal aorta is normal in caliber. Atherosclerosis of the abdominal aorta and its branches. Reproductive: Prostate gland is enlarged causing mass effect on the bladder base. Bladder: Minimally distended with mild wall thickening, likely related to degree of distention. Other: Post laparotomy with midline skin staples. No subcutaneous fluid collection. No pneumoperitoneum or free air in the abdomen. Small amount of dependent free fluid the pelvis in mesentery. Whole body wall edema most consistent with third-spacing. Scattered foci of air in the anterior abdominal wall likely related subcutaneous injections. Musculoskeletal: Diffuse multifocal scleroses  throughout the included skeleton consistent with osseous metastatic disease. Review of the MIP images confirms the above findings. IMPRESSION: 1. Suboptimal contrast bolus for evaluation of pulmonary embolus, allowing for this, no central pulmonary embolus is seen involving the central and lobar pulmonary arteries. 2. Bilateral pleural effusions, increased on the left from prior. There is associated compressive atelectasis bilaterally, the presence of air bronchograms on the left may reflect superimposed pneumonia. 3. Prominent contrast and fluid-filled small bowel without transition point, suspect postoperative ileus. Recurrent small bowel obstruction is felt less likely. 4. Additional chronic findings as described. Electronically Signed   By: Jeb Levering M.D.   On: 06/19/2015 19:58   Ct Abdomen Pelvis W Contrast  06/19/2015  CLINICAL DATA:  Inpatient hospitalized with central abdominal cramping and constipation. Post laparotomy and bowel resection 1 week prior. Today with fever and tachycardia. Patient with history of prostate cancer with bony metastasis. EXAM: CT ANGIOGRAPHY CHEST CT ABDOMEN AND PELVIS WITH CONTRAST TECHNIQUE: Multidetector CT imaging of the chest was performed using the standard protocol during bolus administration of intravenous contrast. Multiplanar CT image reconstructions and MIPs were obtained to evaluate the vascular anatomy. Multidetector CT imaging of the abdomen and pelvis was performed using the standard protocol during bolus administration of intravenous contrast. CONTRAST:  165mL OMNIPAQUE IOHEXOL 350 MG/ML SOLN COMPARISON:  Chest radiographs earlier this day. CT abdomen/pelvis 06/11/2015 FINDINGS: CTA CHEST FINDINGS Vascular: Suboptimal contrast bolus for evaluation of pulmonary embolus, there are no filling defects in the main, right, left, or lobar pulmonary arteries. Segmental and subsegmental branches cannot be assessed, particularly in the lower lobes where there is  motion artifact. Normal caliber thoracic aorta. There is an aberrant right subclavian  artery coursing posterior to the trachea and esophagus. Minimal multi chamber cardiomegaly with coronary artery calcifications. Lungs/pleura: Small bilateral pleural effusions, unchanged on the right and increased on the left from prior abdominal CT. There is adjacent compressive atelectasis at both lung bases, left greater than right. Air bronchograms on the left, with question of superimposed pneumonia. Breathing motion artifact partially obscures detailed lung evaluation. Mediastinum/lymph nodes: Prominent AP window lymph node measures 1.9 cm short axis dimension. There is otherwise no mediastinal or hilar adenopathy. No pericardial effusion. There is a right upper extremity PICC, with tip in the distal SVC. Enteric contrast within the esophagus which is mildly tortuous. Osseous structures: Multiple sclerotic foci throughout bilateral ribs, thoracic spine, and sternum consistent with known osseous metastatic disease. CT ABDOMEN and PELVIS FINDINGS Liver: No focal hepatic lesion. Hepatobiliary: Gallbladder physiologically distended. No calcified gallstone. Pancreas: No ductal dilatation or inflammation. Spleen: Normal. Adrenal glands: No nodule. Kidneys: Symmetric renal enhancement and excretion. No hydronephrosis. Bilateral renal cysts again seen. Stomach/Bowel: Stomach physiologically distended with enteric contrast, there is wall thickening involving the fundus. Fat density lesion involving the distal gastric body measures 1.6 x 1.4 cm, unchanged, query intramural lipoma. Additional fat density lesion in the region of pylorus is also unchanged. Contrast filled prominent proximal small bowel loops without transition point. Fluid-filled and distal small bowel loops, with enteric chain sutures in the right lower quadrant. Colon is tortuous with colonic diverticulosis, no diverticulitis. Appendix remains in situ.  Vascular/Lymphatic: No retroperitoneal adenopathy. Abdominal aorta is normal in caliber. Atherosclerosis of the abdominal aorta and its branches. Reproductive: Prostate gland is enlarged causing mass effect on the bladder base. Bladder: Minimally distended with mild wall thickening, likely related to degree of distention. Other: Post laparotomy with midline skin staples. No subcutaneous fluid collection. No pneumoperitoneum or free air in the abdomen. Small amount of dependent free fluid the pelvis in mesentery. Whole body wall edema most consistent with third-spacing. Scattered foci of air in the anterior abdominal wall likely related subcutaneous injections. Musculoskeletal: Diffuse multifocal scleroses throughout the included skeleton consistent with osseous metastatic disease. Review of the MIP images confirms the above findings. IMPRESSION: 1. Suboptimal contrast bolus for evaluation of pulmonary embolus, allowing for this, no central pulmonary embolus is seen involving the central and lobar pulmonary arteries. 2. Bilateral pleural effusions, increased on the left from prior. There is associated compressive atelectasis bilaterally, the presence of air bronchograms on the left may reflect superimposed pneumonia. 3. Prominent contrast and fluid-filled small bowel without transition point, suspect postoperative ileus. Recurrent small bowel obstruction is felt less likely. 4. Additional chronic findings as described. Electronically Signed   By: Jeb Levering M.D.   On: 06/19/2015 19:58    Medications: . heparin subcutaneous  5,000 Units Subcutaneous 3 times per day  . insulin aspart  0-15 Units Subcutaneous 6 times per day  . insulin glargine  10 Units Subcutaneous BID  . levalbuterol  0.63 mg Nebulization Q8H  . metoprolol  5 mg Intravenous 4 times per day  . pantoprazole (PROTONIX) IV  40 mg Intravenous QHS  . piperacillin-tazobactam (ZOSYN)  IV  3.375 g Intravenous Q8H  . prednisoLONE acetate  1  drop Both Eyes BID  . vancomycin  1,250 mg Intravenous Q12H    Assessment/Plan Small bowel obstruction with internal hernia S/p exploratory laparotomy with SBO, 06/12/15, Dr. [name] Sepsis,  Due to UTI.   Post op ileus- on TPN Hx of GI bleed 11/16-11/19/16 ([facility]) Post op anemia, transfused Tachycardia -  On Coreg and Diltiazem at home per list above/discharge 05/30/15 (mostly sinus with PVC's), will increase B blocker dose AODM Insulin dependant Prostate cancer ongoing treatment Hypertension Hyperlipidemia  Malnutrition with prealbumin 3.5 06/16/15.  Antibiotics: None DVT: SCD/heparin    Plan:  Pt with fevers and sepsis s/p exlap, LOA.  Blood cultures NGTD.  Urine culture with large amount of ecoli.  Will cont Zos and Vanc until fevers are better.  Cont NPO until ileus resolves.     LOS: 9 days    Dannell Raczkowski C. [id number]

## 2015-06-20 NOTE — Progress Notes (Signed)
Patient had a rectal temp of 103.9 overnight.  NP on call was notified and patient was treated with Tylenol suppository. Temperature this am normal.  Will continue to monitor patient.

## 2015-06-20 NOTE — Progress Notes (Signed)
CRITICAL VALUE ALERT  Critical value received:  Calcium 6.1  Date of notification:  06/20/15  Time of notification:  06:59  Critical value read back:Yes.    Nurse who received alert:  Ruben Im, RN  MD notified (1st page):  Posey Pronto via text page  Time of first page:  07:10  MD notified (2nd page):  Time of second page:  Responding MD:    Time MD responded:

## 2015-06-20 NOTE — Progress Notes (Signed)
[facility] Progress Note    Patient: Jeremy Jennings    [id number]  DOB: 16-May-1936     DOA: 06/11/2015 Date of Service: the patient was seen and examined on 06/20/2015  Subjective: Patient complains of pain on his left side of the neck. No chest pain or abdominal pain Nutrition: Nothing by mouth for surgery getting TPN Activity: Mostly bedridden Last BM: Before surgery, passing gas  Assessment and Plan: 1. Sinus tachycardia, healthcare associated pneumonia., Metabolic acidosis.  Patient presented initially with complaints of abdominal pain.  X-ray of the chest on admission was concerning for a possible left basal infiltrate. CT of the abdomen does not show any intra-abdominal abscess and shows possible ileus. CT chest PE is negative for PE. Shows left-sided infiltrate.  Continue on vancomycin and Zosyn, follow the cultures as well as further workup. Continue IV Lopressor at present. Cardiology consulted since the echo cardiogram is showing hyperdynamic LV.  2. Chronic anemia. Recent GI bleed. Hemoglobin today 7. Transfuse 1 unit of PRBC. A repeat H&H after the procedure. The patient recently was presented with complaints of GI bleed as well in last admission. Workup was negative as per documentation. CT abdomen negative for any intra-abdominal bleeding.  3. Small bowel obstruction, status post x-ray laparotomy. Management as per surgery. Patient will be covered with IV Zosyn.  4. Type 2 diabetes mellitus. Patient is on insulin and sliding scale continue close monitoring.  5. History of prostate cancer, castration resistant. Patient on Zytiga at home. Currently on hold due to ongoing surgical and medical issues.  6. Malnutrition. Albumin level less than 2, prealbumin less than 10. Patient is on TPN per pharmacy while the patient is being nothing by mouth. Management per surgery.  7. Pedal edema. The patient has bilateral lower extremity swelling as well as  probable generalized swelling. This is most likely third spacing in the setting of hypoalbuminemia. Patient would benefit from some diuresis at appropriate time. Monitor ins and outs and daily weight.  8. Neck pain. Most likely muscle spasm. Try Robaxin for muscle relaxant. K pad.  DVT Prophylaxis: subcutaneous Heparin Nutrition: TPN Advance goals of care discussion: Full code  Antibiotics: Anti-infectives    Start     Dose/Rate Route Frequency Ordered Stop   06/20/15 0200  vancomycin (VANCOCIN) 1,250 mg in sodium chloride 0.9 % 250 mL IVPB     1,250 mg 166.7 mL/hr over 90 Minutes Intravenous Every 12 hours 06/19/15 1314     06/19/15 1400  vancomycin (VANCOCIN) 2,000 mg in sodium chloride 0.9 % 500 mL IVPB     2,000 mg 250 mL/hr over 120 Minutes Intravenous  Once 06/19/15 1313 06/19/15 1737   06/19/15 1315  piperacillin-tazobactam (ZOSYN) IVPB 3.375 g     3.375 g 12.5 mL/hr over 240 Minutes Intravenous Every 8 hours 06/19/15 1305     06/19/15 1100  cefTRIAXone (ROCEPHIN) 2 g in dextrose 5 % 50 mL IVPB  Status:  Discontinued     2 g 100 mL/hr over 30 Minutes Intravenous Every 24 hours 06/19/15 1020 06/19/15 1246   06/12/15 0930  cefoTEtan (CEFOTAN) 2 g in dextrose 5 % 50 mL IVPB     2 g 100 mL/hr over 30 Minutes Intravenous On call to O.R. 06/12/15 0852 06/12/15 1359       Family Communication: family was present at bedside, at the time of interview.  Opportunity was given to ask question and all questions were answered satisfactorily.   Disposition:  We will continue  to follow the patient.   Intake/Output Summary (Last 24 hours) at 06/20/15 1844 Last data filed at 06/20/15 1342  Gross per 24 hour  Intake 3599.41 ml  Output    975 ml  Net 2624.41 ml   Filed Weights   06/11/15 1830 06/20/15 0943  Weight: 95.3 kg (210 lb 1.6 oz) 99.8 kg (220 lb 0.3 oz)    Objective: Physical Exam: Filed Vitals:   06/20/15 1415 06/20/15 1426 06/20/15 1430 06/20/15 1600  BP: 145/59      Pulse: 110 110 39   Temp:   99.8 F (37.7 C) 97.9 F (36.6 C)  TempSrc:   Axillary Axillary  Resp: 23 23 39   Height:      Weight:      SpO2: 100% 100% 97%      General: Appear in mild distress, no Rash; Oral Mucosa moist. Cardiovascular: S1 and S2 Present, no Murmur, no JVD Respiratory: Bilateral Air entry present and left basal Crackles, no wheezes Abdomen: Bowel Sound absent, Soft and no tenderness Extremities: Bilateral Pedal edema, no calf tenderness Neurology: Grossly no focal neuro deficit.  Data Reviewed: CBC:  Recent Labs Lab 06/15/15 0500 06/16/15 0421 06/17/15 0425 06/19/15 0517 06/20/15 0600  WBC 6.2 5.0 5.4 9.3 10.3  NEUTROABS  --   --  3.8 7.0  --   HGB 8.8* 8.6* 8.6* 8.6* 7.0*  HCT 27.2* 26.9* 26.3* 28.3* 21.5*  MCV 88.9 88.8 89.5 93.7 89.2  PLT 337 344 319 266 [id number]   Basic Metabolic Panel:  Recent Labs Lab 06/16/15 0421 06/17/15 0425 06/18/15 0330 06/19/15 0517 06/20/15 0600  NA 143 146* 143 140 138  K 3.5 3.7 3.7 4.0 3.6  CL 116* 117* 116* 113* 111  CO2 20* 22 23 17* 21*  GLUCOSE 142* 163* 182* 187* 263*  BUN 12 10 10 14 17   CREATININE 0.80 0.76 0.72 0.92 0.90  CALCIUM 6.6* 6.9* 6.6* 6.5* 6.1*  MG 2.0 1.9 1.8 1.8 1.8  PHOS  --  1.0* 1.9* 2.5 2.6   Liver Function Tests:  Recent Labs Lab 06/16/15 0421 06/17/15 0425 06/18/15 0330  AST 19 17 31   ALT 13* 11* 11*  ALKPHOS 290* 267* 346*  BILITOT 0.6 0.5 0.8  PROT 5.2* 5.2* 5.0*  ALBUMIN 1.8* 1.7* 1.7*   No results for input(s): LIPASE, AMYLASE in the last 168 hours. No results for input(s): AMMONIA in the last 168 hours.  Cardiac Enzymes:  Recent Labs Lab 06/19/15 1120  TROPONINI 0.04*   BNP (last 3 results)  Recent Labs  06/19/15 1120  BNP 258.8*    ProBNP (last 3 results) No results for input(s): PROBNP in the last 8760 hours.   CBG:  Recent Labs Lab 06/20/15 0501 06/20/15 0736 06/20/15 0819 06/20/15 1209 06/20/15 1651  GLUCAP 220* 222* 207* 226* 257*     Recent Results (from the past 240 hour(s))  Surgical pcr screen     Status: None   Collection Time: 06/12/15 12:40 PM  Result Value Ref Range Status   MRSA, PCR NEGATIVE NEGATIVE Final   Staphylococcus aureus NEGATIVE NEGATIVE Final    Comment:        The Xpert SA Assay (FDA approved for NASAL specimens in patients over 70 years of age), is one component of a comprehensive surveillance program.  Test performance has been validated by [facility] for patients greater than or equal to 21 year old. It is not intended to diagnose infection nor to guide or monitor treatment.  Culture, blood (routine x 2)     Status: None (Preliminary result)   Collection Time: 06/18/15  9:11 PM  Result Value Ref Range Status   Specimen Description BLOOD LEFT ASSIST CONTROL  Final   Special Requests BOTTLES DRAWN AEROBIC ONLY 5ML  Final   Culture   Final    NO GROWTH 2 DAYS Performed at [facility]    Report Status PENDING  Incomplete  Culture, blood (routine x 2)     Status: None (Preliminary result)   Collection Time: 06/18/15  9:15 PM  Result Value Ref Range Status   Specimen Description BLOOD LEFT HAND  Final   Special Requests BOTTLES DRAWN AEROBIC ONLY 5ML  Final   Culture   Final    NO GROWTH 2 DAYS Performed at [facility]    Report Status PENDING  Incomplete  Culture, Urine     Status: None (Preliminary result)   Collection Time: 06/19/15 10:59 AM  Result Value Ref Range Status   Specimen Description URINE, CLEAN CATCH  Final   Special Requests NONE  Final   Culture   Final    >=100,000 COLONIES/mL ENTEROCOCCUS SPECIES Performed at [facility]    Report Status PENDING  Incomplete     Studies: Ct Angio Chest Pe W/cm &/or Wo Cm  06/19/2015  CLINICAL DATA:  Inpatient hospitalized with central abdominal cramping and constipation. Post laparotomy and bowel resection 1 week prior. Today with fever and tachycardia. Patient with history of prostate cancer  with bony metastasis. EXAM: CT ANGIOGRAPHY CHEST CT ABDOMEN AND PELVIS WITH CONTRAST TECHNIQUE: Multidetector CT imaging of the chest was performed using the standard protocol during bolus administration of intravenous contrast. Multiplanar CT image reconstructions and MIPs were obtained to evaluate the vascular anatomy. Multidetector CT imaging of the abdomen and pelvis was performed using the standard protocol during bolus administration of intravenous contrast. CONTRAST:  126mL OMNIPAQUE IOHEXOL 350 MG/ML SOLN COMPARISON:  Chest radiographs earlier this day. CT abdomen/pelvis 06/11/2015 FINDINGS: CTA CHEST FINDINGS Vascular: Suboptimal contrast bolus for evaluation of pulmonary embolus, there are no filling defects in the main, right, left, or lobar pulmonary arteries. Segmental and subsegmental branches cannot be assessed, particularly in the lower lobes where there is motion artifact. Normal caliber thoracic aorta. 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 Minimal multi chamber cardiomegaly with coronary artery calcifications. Lungs/pleura: Small bilateral pleural effusions, unchanged on the right and increased on the left from prior abdominal CT. There is adjacent compressive atelectasis at both lung bases, left greater than right. Air bronchograms on the left, with question of superimposed pneumonia. Breathing motion artifact partially obscures detailed lung evaluation. Mediastinum/lymph nodes: Prominent AP window lymph node measures 1.9 cm short axis dimension. There is otherwise no mediastinal or hilar adenopathy. No pericardial effusion. There is a right upper extremity PICC, with tip in the distal SVC. Enteric contrast within the esophagus which is mildly tortuous. Osseous structures: Multiple sclerotic foci throughout bilateral ribs, thoracic spine, and sternum consistent with known osseous metastatic disease. CT ABDOMEN and PELVIS FINDINGS Liver: No  focal hepatic lesion. Hepatobiliary: Gallbladder physiologically distended. No calcified gallstone. Pancreas: No ductal dilatation or inflammation. Spleen: Normal. Adrenal glands: No nodule. Kidneys: Symmetric renal enhancement and excretion. No hydronephrosis. Bilateral renal cysts again seen. Stomach/Bowel: Stomach physiologically distended with enteric contrast, there is wall thickening involving the fundus. Fat density lesion involving the distal gastric body measures 1.6 x 1.4 cm, unchanged, query intramural  lipoma. Additional fat density lesion in the region of pylorus is also unchanged. Contrast filled prominent proximal small bowel loops without transition point. Fluid-filled and distal small bowel loops, with enteric chain sutures in the right lower quadrant. Colon is tortuous with colonic diverticulosis, no diverticulitis. Appendix remains in situ. Vascular/Lymphatic: No retroperitoneal adenopathy. Abdominal aorta is normal in caliber. Atherosclerosis of the abdominal aorta and its branches. Reproductive: Prostate gland is enlarged causing mass effect on the bladder base. Bladder: Minimally distended with mild wall thickening, likely related to degree of distention. Other: Post laparotomy with midline skin staples. No subcutaneous fluid collection. No pneumoperitoneum or free air in the abdomen. Small amount of dependent free fluid the pelvis in mesentery. Whole body wall edema most consistent with third-spacing. Scattered foci of air in the anterior abdominal wall likely related subcutaneous injections. Musculoskeletal: Diffuse multifocal scleroses throughout the included skeleton consistent with osseous metastatic disease. Review of the MIP images confirms the above findings. IMPRESSION: 1. Suboptimal contrast bolus for evaluation of pulmonary embolus, allowing for this, no central pulmonary embolus is seen involving the central and lobar pulmonary arteries. 2. Bilateral pleural effusions, increased on  the left from prior. There is associated compressive atelectasis bilaterally, the presence of air bronchograms on the left may reflect superimposed pneumonia. 3. Prominent contrast and fluid-filled small bowel without transition point, suspect postoperative ileus. Recurrent small bowel obstruction is felt less likely. 4. Additional chronic findings as described. Electronically Signed   By: Jeb Levering M.D.   On: 06/19/2015 19:58   Ct Abdomen Pelvis W Contrast  06/19/2015  CLINICAL DATA:  Inpatient hospitalized with central abdominal cramping and constipation. Post laparotomy and bowel resection 1 week prior. Today with fever and tachycardia. Patient with history of prostate cancer with bony metastasis. EXAM: CT ANGIOGRAPHY CHEST CT ABDOMEN AND PELVIS WITH CONTRAST TECHNIQUE: Multidetector CT imaging of the chest was performed using the standard protocol during bolus administration of intravenous contrast. Multiplanar CT image reconstructions and MIPs were obtained to evaluate the vascular anatomy. Multidetector CT imaging of the abdomen and pelvis was performed using the standard protocol during bolus administration of intravenous contrast. CONTRAST:  111mL OMNIPAQUE IOHEXOL 350 MG/ML SOLN COMPARISON:  Chest radiographs earlier this day. CT abdomen/pelvis 06/11/2015 FINDINGS: CTA CHEST FINDINGS Vascular: Suboptimal contrast bolus for evaluation of pulmonary embolus, there are no filling defects in the main, right, left, or lobar pulmonary arteries. Segmental and subsegmental branches cannot be assessed, particularly in the lower lobes where there is motion artifact. Normal caliber thoracic aorta. 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 Minimal multi chamber cardiomegaly with coronary artery calcifications. Lungs/pleura: Small bilateral pleural effusions, unchanged on the right and increased on the left from prior abdominal CT. There is adjacent  compressive atelectasis at both lung bases, left greater than right. Air bronchograms on the left, with question of superimposed pneumonia. Breathing motion artifact partially obscures detailed lung evaluation. Mediastinum/lymph nodes: Prominent AP window lymph node measures 1.9 cm short axis dimension. There is otherwise no mediastinal or hilar adenopathy. No pericardial effusion. There is a right upper extremity PICC, with tip in the distal SVC. Enteric contrast within the esophagus which is mildly tortuous. Osseous structures: Multiple sclerotic foci throughout bilateral ribs, thoracic spine, and sternum consistent with known osseous metastatic disease. CT ABDOMEN and PELVIS FINDINGS Liver: No focal hepatic lesion. Hepatobiliary: Gallbladder physiologically distended. No calcified gallstone. Pancreas: No ductal dilatation or inflammation. Spleen: Normal. Adrenal glands: No nodule. Kidneys: Symmetric renal  enhancement and excretion. No hydronephrosis. Bilateral renal cysts again seen. Stomach/Bowel: Stomach physiologically distended with enteric contrast, there is wall thickening involving the fundus. Fat density lesion involving the distal gastric body measures 1.6 x 1.4 cm, unchanged, query intramural lipoma. Additional fat density lesion in the region of pylorus is also unchanged. Contrast filled prominent proximal small bowel loops without transition point. Fluid-filled and distal small bowel loops, with enteric chain sutures in the right lower quadrant. Colon is tortuous with colonic diverticulosis, no diverticulitis. Appendix remains in situ. Vascular/Lymphatic: No retroperitoneal adenopathy. Abdominal aorta is normal in caliber. Atherosclerosis of the abdominal aorta and its branches. Reproductive: Prostate gland is enlarged causing mass effect on the bladder base. Bladder: Minimally distended with mild wall thickening, likely related to degree of distention. Other: Post laparotomy with midline skin  staples. No subcutaneous fluid collection. No pneumoperitoneum or free air in the abdomen. Small amount of dependent free fluid the pelvis in mesentery. Whole body wall edema most consistent with third-spacing. Scattered foci of air in the anterior abdominal wall likely related subcutaneous injections. Musculoskeletal: Diffuse multifocal scleroses throughout the included skeleton consistent with osseous metastatic disease. Review of the MIP images confirms the above findings. IMPRESSION: 1. Suboptimal contrast bolus for evaluation of pulmonary embolus, allowing for this, no central pulmonary embolus is seen involving the central and lobar pulmonary arteries. 2. Bilateral pleural effusions, increased on the left from prior. There is associated compressive atelectasis bilaterally, the presence of air bronchograms on the left may reflect superimposed pneumonia. 3. Prominent contrast and fluid-filled small bowel without transition point, suspect postoperative ileus. Recurrent small bowel obstruction is felt less likely. 4. Additional chronic findings as described. Electronically Signed   By: Jeb Levering M.D.   On: 06/19/2015 19:58     Scheduled Meds: . heparin subcutaneous  5,000 Units Subcutaneous 3 times per day  . insulin aspart  0-15 Units Subcutaneous 6 times per day  . insulin glargine  10 Units Subcutaneous BID  . levalbuterol  0.63 mg Nebulization Q8H  . metoprolol  10 mg Intravenous 4 times per day  . pantoprazole (PROTONIX) IV  40 mg Intravenous QHS  . piperacillin-tazobactam (ZOSYN)  IV  3.375 g Intravenous Q8H  . prednisoLONE acetate  1 drop Both Eyes BID  . vancomycin  1,250 mg Intravenous Q12H   Continuous Infusions: . Marland KitchenTPN (CLINIMIX-E) Adult 100 mL/hr at 06/20/15 1751   And  . fat emulsion 240 mL (06/20/15 1751)   PRN Meds: acetaminophen **OR** acetaminophen, bisacodyl, diphenhydrAMINE **OR** diphenhydrAMINE, menthol-cetylpyridinium, methocarbamol (ROBAXIN)  IV, morphine injection,  ondansetron **OR** ondansetron (ZOFRAN) IV, phenol, sodium chloride  Time spent: 30 minutes  Author: Berle Mull, MD [facility] Pager: [phone] 06/20/2015 6:44 PM  If 7PM-7AM, please contact night-coverage at [email or web address], password [facility]

## 2015-06-20 NOTE — Progress Notes (Signed)
PARENTERAL NUTRITION CONSULT NOTE - Follow Up  Pharmacy Consult for TPN Indication: Prolonged ileus  No Known Allergies  Patient Measurements: Height: 6' (182.9 cm) Weight: 210 lb 1.6 oz (95.3 kg) IBW/kg (Calculated) : 77.6 Adjusted Body Weight: 82.4 kg Usual Weight:  95.3 kg  Vital Signs: Temp: 100.5 F (38.1 C) (12/10 0732) Temp Source: Rectal (12/10 0732) BP: 142/53 mmHg (12/10 0502) Pulse Rate: 112 (12/10 0502) Intake/Output from previous day: 12/09 0701 - 12/10 0700 In: 2279.4 [I.V.:553.8; IV Piggyback:1150; TPN:575.7] Out: 1500 [Urine:1500] Intake/Output from this shift:    Labs:  Recent Labs  06/19/15 0517 06/20/15 0600  WBC 9.3 10.3  HGB 8.6* 7.0*  HCT 28.3* 21.5*  PLT 266 219    Recent Labs  06/18/15 0330 06/19/15 0517 06/20/15 0600  NA 143 140 138  K 3.7 4.0 3.6  CL 116* 113* 111  CO2 23 17* 21*  GLUCOSE 182* 187* 263*  BUN 10 14 17   CREATININE 0.72 0.92 0.90  CALCIUM 6.6* 6.5* 6.1*  MG 1.8 1.8 1.8  PHOS 1.9* 2.5 2.6  PROT 5.0*  --   --   ALBUMIN 1.7*  --   --   AST 31  --   --   ALT 11*  --   --   ALKPHOS 346*  --   --   BILITOT 0.8  --   --    Estimated Creatinine Clearance: 82.3 mL/min (by C-G formula based on Cr of 0.9).    Recent Labs  06/19/15 2004 06/20/15 0012 06/20/15 0501  GLUCAP 191* 258* 220*   Medications:  Scheduled:  . sodium chloride  10 mL/hr Intravenous Once  . sodium chloride   Intravenous Once  . calcium gluconate  1 g Intravenous Once  . heparin subcutaneous  5,000 Units Subcutaneous 3 times per day  . insulin aspart  0-15 Units Subcutaneous 6 times per day  . insulin glargine  10 Units Subcutaneous Q2200  . metoprolol  5 mg Intravenous 4 times per day  . pantoprazole (PROTONIX) IV  40 mg Intravenous QHS  . piperacillin-tazobactam (ZOSYN)  IV  3.375 g Intravenous Q8H  . prednisoLONE acetate  1 drop Both Eyes BID  . vancomycin  1,250 mg Intravenous Q12H   Infusions:  . 0.45 % NaCl with KCl 20 mEq / L  25 mL/hr at 06/17/15 1704  . dextrose 75 mL/hr at 06/19/15 1537  . Marland KitchenTPN (CLINIMIX-E) Adult 100 mL/hr at 06/19/15 1755   And  . fat emulsion 240 mL (06/19/15 1754)   PRN: acetaminophen **OR** acetaminophen, albuterol, bisacodyl, diphenhydrAMINE **OR** diphenhydrAMINE, menthol-cetylpyridinium, morphine injection, ondansetron **OR** ondansetron (ZOFRAN) IV, phenol, sodium chloride  Insulin Requirements in past 24 hours:  29 unit Novolog SSI yesterday Lantus 10 units qHS  Current Nutrition: NPO  IVF: 1/2 NS with 20KCl at 25 ml/hr  Central access: PICC placed 12/6 TPN start date: 12/6  ASSESSMENT  HPI: 79 year old male with widely metastatic prostate cancer who presented 12/1 with abdominal pain nausea and vomiting. CT scan has shown evidence of small bowel obstruction with possibly a closed loop obstruction. Follow-up x-ray showed no improvement in small bowel obstruction and underwent emergency laparotomy with small bowel resection on 12/2.  Has now developed SBO with internal hernia and has been managed conservatively. Today, 12/6, CCS has consulted pharmacy to begin TPN for prolonged ileus.  Significant events:  12/10: transferred to ICU  Today: 06/20/2015  Glucose: continues to increase as TPN rate increases  Current regimen - Lantus 10 units qHS, Novolog SSI moderate scale q4hr    Electrolytes:   Ca low  = 6.1, corrects to 7.9 with albumin 1.7 - has required almost daily supplementation.   Na, K wnl  Mg and Phos repleted  Renal: SCr 0.90, CrCl 80.6 ml/lmin  LFTs: AlkPhos elevated, others wnl (12/8)  TGs: 157 (12/7)  Prealbumin: low at 3.5 (12/6),4.4 (12/7)   NUTRITIONAL GOALS                                                                                             RD recs (12/2): Kcal: 2300-2500, Protein: 115-130 grams, Fluid: 2.2 L/day Clinimix E 5/15 at a  goal rate of 146ml/hr + 20% fat emulsion at 73ml/hr to provide: 120g/day protein, 2184Kcal/day.  PLAN                                                                                                                          Calcium Gluconate 1gm IV x 1  At 1800 today:  Continue Clinimix E 5/15 at goal rate 188ml/hr  20% fat emulsion at 10ml/hr.  TPN to contain standard multivitamins and trace elements.  Continue 1/2NS with 20KCl @ 64mls/hr  Continue moderate SSI q4h  Increase Lantus to 10 units bid  TPN lab panels on Mondays & Thursdays.  F/u daily.  Minda Ditto PharmD Pager [phone] 06/20/2015, 9:08 AM

## 2015-06-21 LAB — URINE CULTURE

## 2015-06-21 LAB — GLUCOSE, CAPILLARY
GLUCOSE-CAPILLARY: 218 mg/dL — AB (ref 65–99)
GLUCOSE-CAPILLARY: 232 mg/dL — AB (ref 65–99)
GLUCOSE-CAPILLARY: 185 mg/dL — AB (ref 65–99)
Glucose-Capillary: 221 mg/dL — ABNORMAL HIGH (ref 65–99)
Glucose-Capillary: 222 mg/dL — ABNORMAL HIGH (ref 65–99)
Glucose-Capillary: 257 mg/dL — ABNORMAL HIGH (ref 65–99)
Glucose-Capillary: 174 mg/dL — ABNORMAL HIGH (ref 65–99)
Glucose-Capillary: 193 mg/dL — ABNORMAL HIGH (ref 65–99)

## 2015-06-21 LAB — CBC
HEMATOCRIT: 25.1 % — AB (ref 39.0–52.0)
HEMOGLOBIN: 7.6 g/dL — AB (ref 13.0–17.0)
MCH: 30.2 pg (ref 26.0–34.0)
MCHC: 30.3 g/dL (ref 30.0–36.0)
MCV: 99.6 fL (ref 78.0–100.0)
Platelets: 184 10*3/uL (ref 150–400)
RBC: 2.52 MIL/uL — ABNORMAL LOW (ref 4.22–5.81)
RDW: 18.4 % — AB (ref 11.5–15.5)
WBC: 10 10*3/uL (ref 4.0–10.5)

## 2015-06-21 MED ORDER — LIP MEDEX EX OINT
1.0000 "application " | TOPICAL_OINTMENT | Freq: Two times a day (BID) | CUTANEOUS | Status: DC
  Administered 2015-06-21 – 2015-07-03 (×23): 1 via TOPICAL
  Filled 2015-06-21 (×4): qty 7

## 2015-06-21 MED ORDER — BISACODYL 10 MG RE SUPP
10.0000 mg | Freq: Every day | RECTAL | Status: DC
  Administered 2015-06-21 – 2015-06-23 (×3): 10 mg via RECTAL
  Filled 2015-06-21 (×3): qty 1

## 2015-06-21 MED ORDER — TRACE MINERALS CR-CU-MN-SE-ZN 10-1000-500-60 MCG/ML IV SOLN
INTRAVENOUS | Status: AC
  Administered 2015-06-21: 18:00:00 via INTRAVENOUS
  Filled 2015-06-21: qty 2400

## 2015-06-21 MED ORDER — FAT EMULSION 20 % IV EMUL
240.0000 mL | INTRAVENOUS | Status: AC
  Administered 2015-06-21: 240 mL via INTRAVENOUS
  Filled 2015-06-21: qty 250

## 2015-06-21 MED ORDER — ACETAMINOPHEN 500 MG PO TABS
1000.0000 mg | ORAL_TABLET | Freq: Three times a day (TID) | ORAL | Status: DC
  Administered 2015-06-21 – 2015-06-26 (×13): 1000 mg via ORAL
  Filled 2015-06-21 (×13): qty 2

## 2015-06-21 MED ORDER — SACCHAROMYCES BOULARDII 250 MG PO CAPS
250.0000 mg | ORAL_CAPSULE | Freq: Two times a day (BID) | ORAL | Status: DC
  Administered 2015-06-21 – 2015-06-26 (×9): 250 mg via ORAL
  Filled 2015-06-21 (×9): qty 1

## 2015-06-21 MED ORDER — ALUM & MAG HYDROXIDE-SIMETH 200-200-20 MG/5ML PO SUSP: 30.0000 mL | Freq: Four times a day (QID) | ORAL | Status: DC | PRN

## 2015-06-21 MED ORDER — MAGIC MOUTHWASH
15.0000 mL | Freq: Four times a day (QID) | ORAL | Status: DC | PRN
  Filled 2015-06-21: qty 15

## 2015-06-21 MED ORDER — LEVALBUTEROL HCL 0.63 MG/3ML IN NEBU
0.6300 mg | INHALATION_SOLUTION | Freq: Two times a day (BID) | RESPIRATORY_TRACT | Status: DC
  Administered 2015-06-22 (×2): 0.63 mg via RESPIRATORY_TRACT
  Filled 2015-06-21 (×3): qty 3

## 2015-06-21 NOTE — Progress Notes (Addendum)
PARENTERAL NUTRITION CONSULT NOTE - Follow Up  Pharmacy Consult for TPN Indication: Prolonged ileus  No Known Allergies  Patient Measurements: Height: 6' (182.9 cm) Weight: 222 lb 7.1 oz (100.9 kg) IBW/kg (Calculated) : 77.6 Adjusted Body Weight: 82.4 kg Usual Weight:  95.3 kg  Vital Signs: Temp: 97.3 F (36.3 C) (12/11 0800) Temp Source: Axillary (12/11 0519) BP: 151/64 mmHg (12/11 0800) Pulse Rate: 103 (12/11 0800) Intake/Output from previous day: 12/10 0701 - 12/11 0700 In: 1765 [Blood:335; IV Piggyback:760; TPN:660] Out: 1825 [Urine:1825] Intake/Output from this shift: Total I/O In: 120 [Other:10; TPN:110] Out: -   Labs:  Recent Labs  06/20/15 0600 06/20/15 2020 06/21/15 0450  WBC 10.3 9.8 10.0  HGB 7.0* 7.7* 7.6*  HCT 21.5* 26.6* 25.1*  PLT 219 196 184    Recent Labs  06/19/15 0517 06/20/15 0600  NA 140 138  K 4.0 3.6  CL 113* 111  CO2 17* 21*  GLUCOSE 187* 263*  BUN 14 17  CREATININE 0.92 0.90  CALCIUM 6.5* 6.1*  MG 1.8 1.8  PHOS 2.5 2.6   Estimated Creatinine Clearance: 84.5 mL/min (by C-G formula based on Cr of 0.9).    Recent Labs  06/21/15 0006 06/21/15 0427 06/21/15 0751  GLUCAP 222* 221* 218*   Medications:  Scheduled:  . heparin subcutaneous  5,000 Units Subcutaneous 3 times per day  . insulin aspart  0-15 Units Subcutaneous 6 times per day  . insulin glargine  10 Units Subcutaneous BID  . levalbuterol  0.63 mg Nebulization Q8H  . metoprolol  10 mg Intravenous 4 times per day  . pantoprazole (PROTONIX) IV  40 mg Intravenous QHS  . piperacillin-tazobactam (ZOSYN)  IV  3.375 g Intravenous Q8H  . prednisoLONE acetate  1 drop Both Eyes BID  . vancomycin  1,250 mg Intravenous Q12H   Infusions:  . Marland KitchenTPN (CLINIMIX-E) Adult 100 mL/hr at 06/20/15 1751   And  . fat emulsion 240 mL (06/20/15 1751)   Insulin Requirements in past 24 hours:  36 unit Novolog SSI yesterday Lantus 10 units q12  Current Nutrition: NPO  IVF:  none  Central access: PICC placed 12/6 TPN start date: 12/6  ASSESSMENT                                                                                                          HPI: 79 year old male with widely metastatic prostate cancer who presented 12/1 with abdominal pain nausea and vomiting. CT scan has shown evidence of small bowel obstruction with possibly a closed loop obstruction. Follow-up x-ray showed no improvement in small bowel obstruction and underwent emergency laparotomy with small bowel resection on 12/2.  Has now developed SBO with internal hernia and has been managed conservatively. Today, 12/6, CCS has consulted pharmacy to begin TPN for prolonged ileus.  Significant events:  12/10: transferred to ICU 12/11: significant increase in SCr, CBG's remain elevated  Today: 06/21/2015  Glucose: continues elevated despite increased Novolog/Lantus  Current regimen - Lantus 10 units q12, Novolog SSI moderate scale q4hr    Electrolytes:  Ca low  = 7.4, corrects to 9.2 with albumin 1.7 - had required supplementation.   Na low, K wnl  Mg and Phos repleted  Renal: SCr increased to 1.77  LFTs: AlkPhos elevated, others wnl (12/8)  TGs: 157 (12/7)  Prealbumin: low at 3.5 (12/6),4.4 (12/7)   NUTRITIONAL GOALS                                                                                             RD recs (12/2): Kcal: 2300-2500, Protein: 115-130 grams, Fluid: 2.2 L/day Clinimix E 5/15 at a goal rate of 162ml/hr + 20% fat emulsion at 18ml/hr to provide: 120g/day protein, 2184Kcal/day.  PLAN                                                                                                                          At 1800 today:  Continue Clinimix E 5/15 at goal rate 161ml/hr, consider rate reduction if renal fx continues to decrease  20% fat emulsion at 72ml/hr.  TPN to contain standard multivitamins and trace elements.  Discontinue Lantus, add insulin 30  units/bag to TPN,   Continue moderate SSI q4h  TPN lab panels on Mondays & Thursdays.  F/u daily.  Minda Ditto PharmD Pager [phone] 06/21/2015, 8:38 AM

## 2015-06-21 NOTE — Progress Notes (Addendum)
Primary cardiologist: Dr. Darlin Coco  Seen for followup: Tachycardia  Subjective:    Patient resting this morning, but wakes up to voice. Somewhat confused. No chest pain or abdominal pain. No palpitations.  Objective:   Temp:  [97.1 F (36.2 C)-100.5 F (38.1 C)] 97.1 F (36.2 C) (12/11 0519) Pulse Rate:  [39-123] 100 (12/11 0600) Resp:  [17-39] 22 (12/11 0600) BP: (85-180)/(47-95) 154/57 mmHg (12/11 0600) SpO2:  [95 %-100 %] 100 % (12/11 0600) Weight:  [220 lb 0.3 oz (99.8 kg)-222 lb 7.1 oz (100.9 kg)] 222 lb 7.1 oz (100.9 kg) (12/11 0500) Last BM Date: 06/05/15  Filed Weights   06/11/15 1830 06/20/15 0943 06/21/15 0500  Weight: 210 lb 1.6 oz (95.3 kg) 220 lb 0.3 oz (99.8 kg) 222 lb 7.1 oz (100.9 kg)    Intake/Output Summary (Last 24 hours) at 06/21/15 0721 Last data filed at 06/21/15 0440  Gross per 24 hour  Intake   1645 ml  Output   1825 ml  Net   -180 ml    Telemetry: Sinus rhythm and sinus tachycardia.  Exam:  General: No distress.  Lungs: Diminished breath sounds at bases. Prolonged expiratory phase.  Cardiac: RRR, soft systolic murmur, no gallop.  Abdomen: protuberant, NT, decreased BS  Extremities: No pitting.  Lab Results:  Basic Metabolic Panel:  Recent Labs Lab 06/18/15 0330 06/19/15 0517 06/20/15 0600  NA 143 140 138  K 3.7 4.0 3.6  CL 116* 113* 111  CO2 23 17* 21*  GLUCOSE 182* 187* 263*  BUN 10 14 17   CREATININE 0.72 0.92 0.90  CALCIUM 6.6* 6.5* 6.1*  MG 1.8 1.8 1.8    Liver Function Tests:  Recent Labs Lab 06/16/15 0421 06/17/15 0425 06/18/15 0330  AST 19 17 31   ALT 13* 11* 11*  ALKPHOS 290* 267* 346*  BILITOT 0.6 0.5 0.8  PROT 5.2* 5.2* 5.0*  ALBUMIN 1.8* 1.7* 1.7*    CBC:  Recent Labs Lab 06/20/15 0600 06/20/15 2020 06/21/15 0450  WBC 10.3 9.8 10.0  HGB 7.0* 7.7* 7.6*  HCT 21.5* 26.6* 25.1*  MCV 89.2 105.1* 99.6  PLT 219 196 184    Cardiac Enzymes:  Recent Labs Lab 06/19/15 1120    TROPONINI 0.04*    Medications:   Scheduled Medications: . heparin subcutaneous  5,000 Units Subcutaneous 3 times per day  . insulin aspart  0-15 Units Subcutaneous 6 times per day  . insulin glargine  10 Units Subcutaneous BID  . levalbuterol  0.63 mg Nebulization Q8H  . metoprolol  10 mg Intravenous 4 times per day  . pantoprazole (PROTONIX) IV  40 mg Intravenous QHS  . piperacillin-tazobactam (ZOSYN)  IV  3.375 g Intravenous Q8H  . prednisoLONE acetate  1 drop Both Eyes BID  . vancomycin  1,250 mg Intravenous Q12H     Infusions: . Marland KitchenTPN (CLINIMIX-E) Adult 100 mL/hr at 06/20/15 1751   And  . fat emulsion 240 mL (06/20/15 1751)     PRN Medications:  acetaminophen **OR** acetaminophen, bisacodyl, diphenhydrAMINE **OR** diphenhydrAMINE, menthol-cetylpyridinium, methocarbamol (ROBAXIN)  IV, morphine injection, ondansetron **OR** ondansetron (ZOFRAN) IV, phenol, sodium chloride   Assessment:   1. Sinus tachycardia. Most likely multifactorial in the setting of recent small bowel obstruction with discontinuation of typical oral medications including Coreg and Cardizem CD, recent fevers, progressive anemia, suspected infection currently being managed with broad spectrum antibiotics. Currently on IV Lopressor since bowel rest.  2. Hypertensive heart disease with moderate LVH and vigorous LVEF of 75%  with mid cavity obliteration by recent echocardiogram.  3. Small bowel obstruction status post exploratory laparotomy with lysis of adhesions, postoperative day #9 Postoperative ileus as well.  4. History of GI bleed.  5. Essential hypertension, blood pressure mildly elevated at present.   Plan/Discussion:    Continue IV Lopressor for now as other contributing conditions to his tachycardia are addressed. May need to add Lasix if respiratory status declines since pleural effusions noted on chest CT, although could also be associated with pneumonia.   Satira Sark, M.D.,  F.A.C.C.

## 2015-06-21 NOTE — Progress Notes (Addendum)
9 Days Post-Op LOA Subjective:  Sitting on commode Daughter in room RN in room Some flatus but no BM yet Wants to try PO   Objective: Vital signs in last 24 hours: Temp:  [97.1 F (36.2 C)-99.8 F (37.7 C)] 97.3 F (36.3 C) (12/11 0800) Pulse Rate:  [39-123] 108 (12/11 1000) Resp:  [13-39] 19 (12/11 1000) BP: (85-180)/(47-95) 154/70 mmHg (12/11 1000) SpO2:  [95 %-100 %] 100 % (12/11 1000) Weight:  [100.9 kg (222 lb 7.1 oz)] 100.9 kg (222 lb 7.1 oz) (12/11 0500) Last BM Date: 06/05/15     Intake/Output from previous day: 12/10 0701 - 12/11 0700 In: 1765 [Blood:335; IV Piggyback:760; TPN:660] Out: 1825 [Urine:1825] Intake/Output this shift: Total I/O In: 240 [Other:20; TPN:220] Out: -   General: Pt awake/alert/oriented x4 in no major acute distress Eyes: PERRL, normal EOM. Sclera nonicteric Neuro: CN II-XII intact w/o focal sensory/motor deficits. Lymph: No head/neck/groin lymphadenopathy Psych:  No delerium/psychosis/paranoia HENT: Normocephalic, Mucus membranes moist.  No thrush Neck: Supple, No tracheal deviation Chest: No pain.  Good respiratory excursion. CV:  Pulses intact.  Regular rhythm MS: Normal AROM mjr joints.  No obvious deformity Abdomen: Mildly firm w anasarca/edema.  Incision clean, Nontender.  No incarcerated hernias. Ext:  TED hose BLE.  2-3+ edema BLE.  No cyanosis Skin: No petechiae / purpura   Lab Results:   Recent Labs  06/20/15 2020 06/21/15 0450  WBC 9.8 10.0  HGB 7.7* 7.6*  HCT 26.6* 25.1*  PLT 196 184    BMET  Recent Labs  06/20/15 0600 06/21/15 0900  NA 138 129*  K 3.6 4.1  CL 111 106  CO2 21* 14*  GLUCOSE 263* 67  BUN 17 22*  CREATININE 0.90 1.77*  CALCIUM 6.1* 7.4*   PT/INR No results for input(s): LABPROT, INR in the last 72 hours.   Recent Labs Lab 06/16/15 0421 06/17/15 0425 06/18/15 0330  AST 19 17 31   ALT 13* 11* 11*  ALKPHOS 290* 267* 346*  BILITOT 0.6 0.5 0.8  PROT 5.2* 5.2* 5.0*  ALBUMIN 1.8*  1.7* 1.7*     Lipase     Component Value Date/Time   LIPASE 46 11/09/2014 1137     Studies/Results: Dg Chest 2 View  06/19/2015  CLINICAL DATA:  Fever and weakness EXAM: CHEST  2 VIEW COMPARISON:  Four days ago FINDINGS: Persistent opacity at the left base with elevated diaphragm. Right upper extremity PICC has been placed, tip at the SVC. Normal heart size for technique. Stable upper mediastinal contours. No pulmonary edema or pleural effusion. IMPRESSION: 1. Persistent left basilar atelectasis. Given fever history, note that there could be superimposed pneumonia. 2. New right upper extremity PICC is in good position. Electronically Signed   By: Monte Fantasia M.D.   On: 06/19/2015 10:50   Ct Angio Chest Pe W/cm &/or Wo Cm  06/19/2015  CLINICAL DATA:  Inpatient hospitalized with central abdominal cramping and constipation. Post laparotomy and bowel resection 1 week prior. Today with fever and tachycardia. Patient with history of prostate cancer with bony metastasis. EXAM: CT ANGIOGRAPHY CHEST CT ABDOMEN AND PELVIS WITH CONTRAST TECHNIQUE: Multidetector CT imaging of the chest was performed using the standard protocol during bolus administration of intravenous contrast. Multiplanar CT image reconstructions and MIPs were obtained to evaluate the vascular anatomy. Multidetector CT imaging of the abdomen and pelvis was performed using the standard protocol during bolus administration of intravenous contrast. CONTRAST:  118mL OMNIPAQUE IOHEXOL 350 MG/ML SOLN COMPARISON:  Chest radiographs  earlier this day. CT abdomen/pelvis 06/11/2015 FINDINGS: CTA CHEST FINDINGS Vascular: Suboptimal contrast bolus for evaluation of pulmonary embolus, there are no filling defects in the main, right, left, or lobar pulmonary arteries. Segmental and subsegmental branches cannot be assessed, particularly in the lower lobes where there is motion artifact. Normal caliber thoracic aorta. There is an aberrant right subclavian  artery coursing posterior to the trachea and esophagus. Minimal multi chamber cardiomegaly with coronary artery calcifications. Lungs/pleura: Small bilateral pleural effusions, unchanged on the right and increased on the left from prior abdominal CT. There is adjacent compressive atelectasis at both lung bases, left greater than right. Air bronchograms on the left, with question of superimposed pneumonia. Breathing motion artifact partially obscures detailed lung evaluation. Mediastinum/lymph nodes: Prominent AP window lymph node measures 1.9 cm short axis dimension. There is otherwise no mediastinal or hilar adenopathy. No pericardial effusion. There is a right upper extremity PICC, with tip in the distal SVC. Enteric contrast within the esophagus which is mildly tortuous. Osseous structures: Multiple sclerotic foci throughout bilateral ribs, thoracic spine, and sternum consistent with known osseous metastatic disease. CT ABDOMEN and PELVIS FINDINGS Liver: No focal hepatic lesion. Hepatobiliary: Gallbladder physiologically distended. No calcified gallstone. Pancreas: No ductal dilatation or inflammation. Spleen: Normal. Adrenal glands: No nodule. Kidneys: Symmetric renal enhancement and excretion. No hydronephrosis. Bilateral renal cysts again seen. Stomach/Bowel: Stomach physiologically distended with enteric contrast, there is wall thickening involving the fundus. Fat density lesion involving the distal gastric body measures 1.6 x 1.4 cm, unchanged, query intramural lipoma. Additional fat density lesion in the region of pylorus is also unchanged. Contrast filled prominent proximal small bowel loops without transition point. Fluid-filled and distal small bowel loops, with enteric chain sutures in the right lower quadrant. Colon is tortuous with colonic diverticulosis, no diverticulitis. Appendix remains in situ. Vascular/Lymphatic: No retroperitoneal adenopathy. Abdominal aorta is normal in caliber.  Atherosclerosis of the abdominal aorta and its branches. Reproductive: Prostate gland is enlarged causing mass effect on the bladder base. Bladder: Minimally distended with mild wall thickening, likely related to degree of distention. Other: Post laparotomy with midline skin staples. No subcutaneous fluid collection. No pneumoperitoneum or free air in the abdomen. Small amount of dependent free fluid the pelvis in mesentery. Whole body wall edema most consistent with third-spacing. Scattered foci of air in the anterior abdominal wall likely related subcutaneous injections. Musculoskeletal: Diffuse multifocal scleroses throughout the included skeleton consistent with osseous metastatic disease. Review of the MIP images confirms the above findings. IMPRESSION: 1. Suboptimal contrast bolus for evaluation of pulmonary embolus, allowing for this, no central pulmonary embolus is seen involving the central and lobar pulmonary arteries. 2. Bilateral pleural effusions, increased on the left from prior. There is associated compressive atelectasis bilaterally, the presence of air bronchograms on the left may reflect superimposed pneumonia. 3. Prominent contrast and fluid-filled small bowel without transition point, suspect postoperative ileus. Recurrent small bowel obstruction is felt less likely. 4. Additional chronic findings as described. Electronically Signed   By: Jeb Levering M.D.   On: 06/19/2015 19:58   Ct Abdomen Pelvis W Contrast  06/19/2015  CLINICAL DATA:  Inpatient hospitalized with central abdominal cramping and constipation. Post laparotomy and bowel resection 1 week prior. Today with fever and tachycardia. Patient with history of prostate cancer with bony metastasis. EXAM: CT ANGIOGRAPHY CHEST CT ABDOMEN AND PELVIS WITH CONTRAST TECHNIQUE: Multidetector CT imaging of the chest was performed using the standard protocol during bolus administration of intravenous contrast. Multiplanar CT image  reconstructions  and MIPs were obtained to evaluate the vascular anatomy. Multidetector CT imaging of the abdomen and pelvis was performed using the standard protocol during bolus administration of intravenous contrast. CONTRAST:  175mL OMNIPAQUE IOHEXOL 350 MG/ML SOLN COMPARISON:  Chest radiographs earlier this day. CT abdomen/pelvis 06/11/2015 FINDINGS: CTA CHEST FINDINGS Vascular: Suboptimal contrast bolus for evaluation of pulmonary embolus, there are no filling defects in the main, right, left, or lobar pulmonary arteries. Segmental and subsegmental branches cannot be assessed, particularly in the lower lobes where there is motion artifact. Normal caliber thoracic aorta. 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 Minimal multi chamber cardiomegaly with coronary artery calcifications. Lungs/pleura: Small bilateral pleural effusions, unchanged on the right and increased on the left from prior abdominal CT. There is adjacent compressive atelectasis at both lung bases, left greater than right. Air bronchograms on the left, with question of superimposed pneumonia. Breathing motion artifact partially obscures detailed lung evaluation. Mediastinum/lymph nodes: Prominent AP window lymph node measures 1.9 cm short axis dimension. There is otherwise no mediastinal or hilar adenopathy. No pericardial effusion. There is a right upper extremity PICC, with tip in the distal SVC. Enteric contrast within the esophagus which is mildly tortuous. Osseous structures: Multiple sclerotic foci throughout bilateral ribs, thoracic spine, and sternum consistent with known osseous metastatic disease. CT ABDOMEN and PELVIS FINDINGS Liver: No focal hepatic lesion. Hepatobiliary: Gallbladder physiologically distended. No calcified gallstone. Pancreas: No ductal dilatation or inflammation. Spleen: Normal. Adrenal glands: No nodule. Kidneys: Symmetric renal enhancement and excretion. No  hydronephrosis. Bilateral renal cysts again seen. Stomach/Bowel: Stomach physiologically distended with enteric contrast, there is wall thickening involving the fundus. Fat density lesion involving the distal gastric body measures 1.6 x 1.4 cm, unchanged, query intramural lipoma. Additional fat density lesion in the region of pylorus is also unchanged. Contrast filled prominent proximal small bowel loops without transition point. Fluid-filled and distal small bowel loops, with enteric chain sutures in the right lower quadrant. Colon is tortuous with colonic diverticulosis, no diverticulitis. Appendix remains in situ. Vascular/Lymphatic: No retroperitoneal adenopathy. Abdominal aorta is normal in caliber. Atherosclerosis of the abdominal aorta and its branches. Reproductive: Prostate gland is enlarged causing mass effect on the bladder base. Bladder: Minimally distended with mild wall thickening, likely related to degree of distention. Other: Post laparotomy with midline skin staples. No subcutaneous fluid collection. No pneumoperitoneum or free air in the abdomen. Small amount of dependent free fluid the pelvis in mesentery. Whole body wall edema most consistent with third-spacing. Scattered foci of air in the anterior abdominal wall likely related subcutaneous injections. Musculoskeletal: Diffuse multifocal scleroses throughout the included skeleton consistent with osseous metastatic disease. Review of the MIP images confirms the above findings. IMPRESSION: 1. Suboptimal contrast bolus for evaluation of pulmonary embolus, allowing for this, no central pulmonary embolus is seen involving the central and lobar pulmonary arteries. 2. Bilateral pleural effusions, increased on the left from prior. There is associated compressive atelectasis bilaterally, the presence of air bronchograms on the left may reflect superimposed pneumonia. 3. Prominent contrast and fluid-filled small bowel without transition point, suspect  postoperative ileus. Recurrent small bowel obstruction is felt less likely. 4. Additional chronic findings as described. Electronically Signed   By: Jeb Levering M.D.   On: 06/19/2015 19:58    Medications: . heparin subcutaneous  5,000 Units Subcutaneous 3 times per day  . insulin aspart  0-15 Units Subcutaneous 6 times per day  . insulin glargine  10 Units Subcutaneous BID  .  levalbuterol  0.63 mg Nebulization Q8H  . metoprolol  10 mg Intravenous 4 times per day  . pantoprazole (PROTONIX) IV  40 mg Intravenous QHS  . piperacillin-tazobactam (ZOSYN)  IV  3.375 g Intravenous Q8H  . prednisoLONE acetate  1 drop Both Eyes BID  . vancomycin  1,250 mg Intravenous Q12H    Assessment/Plan Small bowel obstruction with internal hernia S/p exploratory laparotomy with SB resection from strangulated SBO, 06/12/15, Dr. [name]  Sepsis,  Due to UTI - improving on Zosyn/Vanco  Post op ileus- on TPN.  Try clears  Hx of GI bleed 11/16-11/19/16 ([facility])  Post op anemia, transfused  Tachycardia - On Coreg and Diltiazem at home per list above/discharge 05/30/15 (mostly sinus with PVC's), will increase B blocker dose.  Diuresis as tolerated  AODM Insulin dependant  Prostate cancer ongoing treatment  Hypertension  Hyperlipidemia   Malnutrition with prealbumin 3.5 06/16/15. TNA  VTE proph: SCD/heparin       LOS: 10 days    Partick Musselman C. 06/21/2015

## 2015-06-21 NOTE — Progress Notes (Signed)
[facility] Progress Note    Patient: Jeremy Jennings    [id number]  DOB: [birth year]     DOA: 06/11/2015 Date of Service: the patient was seen and examined on 06/21/2015  Subjective: denies Neck pain has resolved. Denies having any headache dizziness lightheadedness shortness of breath. No vomiting. Continues to have no bowel movement. Passes gas. Nutrition: Nothing by mouth for surgery, getting TPN Activity: Mostly bedridden Last BM: Before surgery, passing gas  Assessment and Plan: 1. Sinus tachycardia, healthcare associated pneumonia., Metabolic acidosis. Heart rate 110, continue lopressor. Continue on vancomycin and Zosyn, follow the cultures as well as further workup. Appreciate cardiology input.  CT of the abdomen does not show any intra-abdominal abscess and shows possible ileus. CT chest PE is negative for PE. Shows left-sided infiltrate.  2. Volume overload Patient is third spacing due to hypoalbuminemia Would prefer to have him diuresed, but will await cardiology recommendation due to hyperdynamic LV and possible LVOT. TED stocking  Stopped all the fluids other than TPN.  3.Chronic anemia. Recent GI bleed. Hemoglobin today 7.6. Transfuse for Hb less than 7.  CT abdomen negative for any intra-abdominal bleeding.  4. Small bowel obstruction, status post laparotomy and colectomy. Management as per surgery. Patient will be covered with IV Zosyn.  4. Type 2 diabetes mellitus. Patient is on insulin and sliding scale continue close monitoring. Pharmacy increased lantus to twice a day, agree with the same.  5. History of prostate cancer, castration resistant. Patient on Zytiga at home. Currently on hold due to ongoing surgical and medical issues.  6. Malnutrition. Albumin level less than 2, prealbumin less than 10. Patient is on TPN per pharmacy while the patient is being nothing by mouth. Management per surgery.  7. Pedal edema. This is most likely third  spacing in the setting of hypoalbuminemia. Doppler negative for DVT  8. Neck pain. Most likely muscle spasm. Resolved.  DVT Prophylaxis: subcutaneous Heparin Nutrition: TPN  Advance goals of care discussion: Full code  Antibiotics: Anti-infectives    Start     Dose/Rate Route Frequency Ordered Stop   06/20/15 0200  vancomycin (VANCOCIN) 1,250 mg in sodium chloride 0.9 % 250 mL IVPB     1,250 mg 166.7 mL/hr over 90 Minutes Intravenous Every 12 hours 06/19/15 1314     06/19/15 1400  vancomycin (VANCOCIN) 2,000 mg in sodium chloride 0.9 % 500 mL IVPB     2,000 mg 250 mL/hr over 120 Minutes Intravenous  Once 06/19/15 1313 06/19/15 1737   06/19/15 1315  piperacillin-tazobactam (ZOSYN) IVPB 3.375 g     3.375 g 12.5 mL/hr over 240 Minutes Intravenous Every 8 hours 06/19/15 1305     06/19/15 1100  cefTRIAXone (ROCEPHIN) 2 g in dextrose 5 % 50 mL IVPB  Status:  Discontinued     2 g 100 mL/hr over 30 Minutes Intravenous Every 24 hours 06/19/15 1020 06/19/15 1246   06/12/15 0930  cefoTEtan (CEFOTAN) 2 g in dextrose 5 % 50 mL IVPB     2 g 100 mL/hr over 30 Minutes Intravenous On call to O.R. 06/12/15 0852 06/12/15 1359      Family Communication: family was present at bedside, at the time of interview.  Opportunity was given to ask question and all questions were answered satisfactorily.   Disposition:  We will continue to follow the patient.   Intake/Output Summary (Last 24 hours) at 06/21/15 1024 Last data filed at 06/21/15 1000  Gross per 24 hour  Intake   2005  ml  Output   1825 ml  Net    180 ml   Filed Weights   06/11/15 1830 06/20/15 0943 06/21/15 0500  Weight: 95.3 kg (210 lb 1.6 oz) 99.8 kg (220 lb 0.3 oz) 100.9 kg (222 lb 7.1 oz)    Objective: Physical Exam: Filed Vitals:   06/21/15 0600 06/21/15 0700 06/21/15 0800 06/21/15 1000  BP: 154/57 148/72 151/64 154/70  Pulse: 100 102 103 108  Temp:   97.3 F (36.3 C)   TempSrc:      Resp: 22 24 13 19   Height:        Weight:      SpO2: 100% 99% 100% 100%   General: Appear in mild distress, no Rash; Oral Mucosa moist. Cardiovascular: S1 and S2 Present, no Murmur Respiratory: Bilateral Air entry present and left basal Crackles, no wheezes Abdomen: Bowel Sound absent, Soft and no tenderness Extremities: Bilateral Pedal edema, no calf tenderness  Data Reviewed: CBC:  Recent Labs Lab 06/17/15 0425 06/19/15 0517 06/20/15 0600 06/20/15 2020 06/21/15 0450  WBC 5.4 9.3 10.3 9.8 10.0  NEUTROABS 3.8 7.0  --   --   --   HGB 8.6* 8.6* 7.0* 7.7* 7.6*  HCT 26.3* 28.3* 21.5* 26.6* 25.1*  MCV 89.5 93.7 89.2 105.1* 99.6  PLT 319 266 219 196 [id number]   Basic Metabolic Panel:  Recent Labs Lab 06/16/15 0421 06/17/15 0425 06/18/15 0330 06/19/15 0517 06/20/15 0600 06/21/15 0900  NA 143 146* 143 140 138 129*  K 3.5 3.7 3.7 4.0 3.6 4.1  CL 116* 117* 116* 113* 111 106  CO2 20* 22 23 17* 21* 14*  GLUCOSE 142* 163* 182* 187* 263* 67  BUN 12 10 10 14 17  22*  CREATININE 0.80 0.76 0.72 0.92 0.90 1.77*  CALCIUM 6.6* 6.9* 6.6* 6.5* 6.1* 7.4*  MG 2.0 1.9 1.8 1.8 1.8  --   PHOS  --  1.0* 1.9* 2.5 2.6  --    Liver Function Tests:  Recent Labs Lab 06/16/15 0421 06/17/15 0425 06/18/15 0330  AST 19 17 31   ALT 13* 11* 11*  ALKPHOS 290* 267* 346*  BILITOT 0.6 0.5 0.8  PROT 5.2* 5.2* 5.0*  ALBUMIN 1.8* 1.7* 1.7*   No results for input(s): LIPASE, AMYLASE in the last 168 hours. No results for input(s): AMMONIA in the last 168 hours.  Cardiac Enzymes:  Recent Labs Lab 06/19/15 1120  TROPONINI 0.04*   BNP (last 3 results)  Recent Labs  06/19/15 1120  BNP 258.8*   CBG:  Recent Labs Lab 06/20/15 1651 06/20/15 2023 06/21/15 0006 06/21/15 0427 06/21/15 0751  GLUCAP 257* 257* 222* 221* 218*    Recent Results (from the past 240 hour(s))  Surgical pcr screen     Status: None   Collection Time: 06/12/15 12:40 PM  Result Value Ref Range Status   MRSA, PCR NEGATIVE NEGATIVE Final    Staphylococcus aureus NEGATIVE NEGATIVE Final    Comment:        The Xpert SA Assay (FDA approved for NASAL specimens in patients over 56 years of age), is one component of a comprehensive surveillance program.  Test performance has been validated by [facility] for patients greater than or equal to 39 year old. It is not intended to diagnose infection nor to guide or monitor treatment.   Culture, blood (routine x 2)     Status: None (Preliminary result)   Collection Time: 06/18/15  9:11 PM  Result Value Ref Range Status  Specimen Description BLOOD LEFT ASSIST CONTROL  Final   Special Requests BOTTLES DRAWN AEROBIC ONLY 5ML  Final   Culture   Final    NO GROWTH 2 DAYS Performed at [facility]    Report Status PENDING  Incomplete  Culture, blood (routine x 2)     Status: None (Preliminary result)   Collection Time: 06/18/15  9:15 PM  Result Value Ref Range Status   Specimen Description BLOOD LEFT HAND  Final   Special Requests BOTTLES DRAWN AEROBIC ONLY 5ML  Final   Culture   Final    NO GROWTH 2 DAYS Performed at [facility]    Report Status PENDING  Incomplete  Culture, Urine     Status: None   Collection Time: 06/19/15 10:59 AM  Result Value Ref Range Status   Specimen Description URINE, CLEAN CATCH  Final   Special Requests NONE  Final   Culture   Final    >=100,000 COLONIES/mL ENTEROCOCCUS SPECIES Performed at [facility]    Report Status 06/21/2015 FINAL  Final   Organism ID, Bacteria ENTEROCOCCUS SPECIES  Final      Susceptibility   Enterococcus species - MIC*    AMPICILLIN <=2 SENSITIVE Sensitive     LEVOFLOXACIN 1 SENSITIVE Sensitive     NITROFURANTOIN <=16 SENSITIVE Sensitive     VANCOMYCIN 1 SENSITIVE Sensitive     * >=100,000 COLONIES/mL ENTEROCOCCUS SPECIES     Studies: No results found.   Scheduled Meds: . heparin subcutaneous  5,000 Units Subcutaneous 3 times per day  . insulin aspart  0-15 Units Subcutaneous 6 times  per day  . insulin glargine  10 Units Subcutaneous BID  . levalbuterol  0.63 mg Nebulization Q8H  . metoprolol  10 mg Intravenous 4 times per day  . pantoprazole (PROTONIX) IV  40 mg Intravenous QHS  . piperacillin-tazobactam (ZOSYN)  IV  3.375 g Intravenous Q8H  . prednisoLONE acetate  1 drop Both Eyes BID  . vancomycin  1,250 mg Intravenous Q12H   Continuous Infusions: . Marland KitchenTPN (CLINIMIX-E) Adult 100 mL/hr at 06/20/15 1751   And  . fat emulsion 240 mL (06/20/15 1751)   PRN Meds: acetaminophen **OR** acetaminophen, bisacodyl, diphenhydrAMINE **OR** diphenhydrAMINE, menthol-cetylpyridinium, methocarbamol (ROBAXIN)  IV, morphine injection, ondansetron **OR** ondansetron (ZOFRAN) IV, phenol, sodium chloride  Time spent: 30 minutes  Author: Berle Mull, MD [facility] Pager: [phone] 06/21/2015 10:24 AM  If 7PM-7AM, please contact night-coverage at [email or web address], password [email or web address]

## 2015-06-22 DIAGNOSIS — R000 Tachycardia, unspecified: Secondary | ICD-10-CM

## 2015-06-22 LAB — COMPREHENSIVE METABOLIC PANEL
ALBUMIN: 1.5 g/dL — AB (ref 3.5–5.0)
ALK PHOS: 327 U/L — AB (ref 38–126)
ALT: 21 U/L (ref 17–63)
ANION GAP: 7 (ref 5–15)
AST: 46 U/L — ABNORMAL HIGH (ref 15–41)
BUN: 20 mg/dL (ref 6–20)
CALCIUM: 6.3 mg/dL — AB (ref 8.9–10.3)
CHLORIDE: 108 mmol/L (ref 101–111)
CO2: 20 mmol/L — AB (ref 22–32)
CREATININE: 0.86 mg/dL (ref 0.61–1.24)
GFR calc non Af Amer: >60 mL/min (ref >60)
GLUCOSE: 150 mg/dL — AB (ref 65–99)
Potassium: 3.1 mmol/L — ABNORMAL LOW (ref 3.5–5.1)
SODIUM: 135 mmol/L (ref 135–145)
Total Bilirubin: 0.5 mg/dL (ref 0.3–1.2)
Total Protein: 4.8 g/dL — ABNORMAL LOW (ref 6.5–8.1)

## 2015-06-22 LAB — CBC
HEMATOCRIT: 24.7 % — AB (ref 39.0–52.0)
HEMOGLOBIN: 8.1 g/dL — AB (ref 13.0–17.0)
MCH: 29.5 pg (ref 26.0–34.0)
MCHC: 32.8 g/dL (ref 30.0–36.0)
MCV: 89.8 fL (ref 78.0–100.0)
Platelets: 217 10*3/uL (ref 150–400)
RBC: 2.75 MIL/uL — ABNORMAL LOW (ref 4.22–5.81)
RDW: 16.8 % — AB (ref 11.5–15.5)
WBC: 10.2 10*3/uL (ref 4.0–10.5)

## 2015-06-22 LAB — GLUCOSE, CAPILLARY
GLUCOSE-CAPILLARY: 147 mg/dL — AB (ref 65–99)
GLUCOSE-CAPILLARY: 114 mg/dL — AB (ref 65–99)
GLUCOSE-CAPILLARY: 153 mg/dL — AB (ref 65–99)
GLUCOSE-CAPILLARY: 190 mg/dL — AB (ref 65–99)

## 2015-06-22 LAB — BASIC METABOLIC PANEL

## 2015-06-22 LAB — DIFFERENTIAL
BASOS ABS: 0 10*3/uL (ref 0.0–0.1)
BASOS PCT: 0 %
EOS ABS: 0.3 10*3/uL (ref 0.0–0.7)
EOS PCT: 3 %
LYMPHS ABS: 0.8 10*3/uL (ref 0.7–4.0)
Lymphocytes Relative: 8 %
MONO ABS: 0.7 10*3/uL (ref 0.1–1.0)
MONOS PCT: 7 %
Neutro Abs: 8.4 10*3/uL — ABNORMAL HIGH (ref 1.7–7.7)
Neutrophils Relative %: 82 %

## 2015-06-22 LAB — PREALBUMIN: PREALBUMIN: 2.4 mg/dL — AB (ref 18–38)

## 2015-06-22 LAB — MAGNESIUM: Magnesium: 1.9 mg/dL (ref 1.7–2.4)

## 2015-06-22 LAB — TRIGLYCERIDES: Triglycerides: 149 mg/dL (ref <150)

## 2015-06-22 LAB — PHOSPHORUS: PHOSPHORUS: 2 mg/dL — AB (ref 2.5–4.6)

## 2015-06-22 LAB — VANCOMYCIN, TROUGH: Vancomycin Tr: 23 ug/mL — ABNORMAL HIGH (ref 10.0–20.0)

## 2015-06-22 LAB — PROTIME-INR
INR: 1.24 (ref 0.00–1.49)
PROTHROMBIN TIME: 15.8 s — AB (ref 11.6–15.2)

## 2015-06-22 MED ORDER — SODIUM CHLORIDE 0.9 % IV SOLN
15.0000 mmol | Freq: Once | INTRAVENOUS | Status: AC
  Administered 2015-06-22: 15 mmol via INTRAVENOUS
  Filled 2015-06-22: qty 5

## 2015-06-22 MED ORDER — CARVEDILOL 12.5 MG PO TABS
12.5000 mg | ORAL_TABLET | Freq: Two times a day (BID) | ORAL | Status: DC
  Administered 2015-06-22 – 2015-06-23 (×3): 12.5 mg via ORAL
  Filled 2015-06-22 (×4): qty 1

## 2015-06-22 MED ORDER — FAT EMULSION 20 % IV EMUL
240.0000 mL | INTRAVENOUS | Status: AC
  Administered 2015-06-22: 240 mL via INTRAVENOUS
  Filled 2015-06-22: qty 250

## 2015-06-22 MED ORDER — LEVALBUTEROL HCL 1.25 MG/0.5ML IN NEBU
1.2500 mg | INHALATION_SOLUTION | Freq: Once | RESPIRATORY_TRACT | Status: AC
  Administered 2015-06-22: 1.25 mg via RESPIRATORY_TRACT
  Filled 2015-06-22: qty 0.5

## 2015-06-22 MED ORDER — FUROSEMIDE 10 MG/ML IJ SOLN
40.0000 mg | Freq: Once | INTRAMUSCULAR | Status: DC
  Filled 2015-06-22: qty 4

## 2015-06-22 MED ORDER — POTASSIUM CHLORIDE 10 MEQ/50ML IV SOLN
10.0000 meq | INTRAVENOUS | Status: AC
  Administered 2015-06-22 (×4): 10 meq via INTRAVENOUS
  Filled 2015-06-22 (×5): qty 50

## 2015-06-22 MED ORDER — FUROSEMIDE 10 MG/ML IJ SOLN
40.0000 mg | Freq: Once | INTRAMUSCULAR | Status: AC
  Administered 2015-06-22: 40 mg via INTRAVENOUS
  Filled 2015-06-22: qty 4

## 2015-06-22 MED ORDER — INSULIN GLARGINE 100 UNIT/ML ~~LOC~~ SOLN
10.0000 [IU] | Freq: Every day | SUBCUTANEOUS | Status: DC
  Administered 2015-06-23: 10 [IU] via SUBCUTANEOUS
  Filled 2015-06-22 (×2): qty 0.1

## 2015-06-22 MED ORDER — VANCOMYCIN HCL IN DEXTROSE 1-5 GM/200ML-% IV SOLN
1000.0000 mg | Freq: Two times a day (BID) | INTRAVENOUS | Status: DC
  Administered 2015-06-22 – 2015-06-23 (×2): 1000 mg via INTRAVENOUS
  Filled 2015-06-22 (×2): qty 200

## 2015-06-22 MED ORDER — MAGNESIUM SULFATE IN D5W 10-5 MG/ML-% IV SOLN
1.0000 g | Freq: Once | INTRAVENOUS | Status: AC
  Administered 2015-06-22: 1 g via INTRAVENOUS
  Filled 2015-06-22 (×2): qty 100

## 2015-06-22 MED ORDER — TRACE MINERALS CR-CU-MN-SE-ZN 10-1000-500-60 MCG/ML IV SOLN
INTRAVENOUS | Status: AC
  Administered 2015-06-22: 18:00:00 via INTRAVENOUS
  Filled 2015-06-22: qty 1000

## 2015-06-22 MED ORDER — DILTIAZEM HCL ER COATED BEADS 180 MG PO CP24
360.0000 mg | ORAL_CAPSULE | Freq: Every day | ORAL | Status: DC
  Administered 2015-06-22 – 2015-06-23 (×2): 360 mg via ORAL
  Filled 2015-06-22 (×2): qty 2

## 2015-06-22 NOTE — Progress Notes (Signed)
Date: June 22, 2015 Chart reviewed for concurrent status and case management needs. Will continue to follow patient for changes and needs: Shekira Drummer, RN, BSN, CCM   [phone] 

## 2015-06-22 NOTE — Progress Notes (Signed)
Patient ID: Jeremy Jennings, male   DOB: Nov 08, 1935, 79 y.o.   MRN: [id number]    Primary cardiologist: Dr. Darlin Coco  Seen for followup: Tachycardia  Subjective:    No complaints taking PO  Objective:   Temp:  [97.3 F (36.3 C)-100.4 F (38 C)] 97.6 F (36.4 C) (12/12 0800) Pulse Rate:  [91-118] 105 (12/12 0400) Resp:  [19-35] 20 (12/12 0400) BP: (111-171)/(49-73) 171/71 mmHg (12/12 0400) SpO2:  [95 %-100 %] 100 % (12/12 0400) FiO2 (%):  [21 %] 21 % (12/11 1422) Weight:  [104.8 kg (231 lb 0.7 oz)] 104.8 kg (231 lb 0.7 oz) (12/12 0500) Last BM Date: 06/05/15  Filed Weights   06/20/15 0943 06/21/15 0500 06/22/15 0500  Weight: 99.8 kg (220 lb 0.3 oz) 100.9 kg (222 lb 7.1 oz) 104.8 kg (231 lb 0.7 oz)    Intake/Output Summary (Last 24 hours) at 06/22/15 0835 Last data filed at 06/22/15 0520  Gross per 24 hour  Intake 3796.66 ml  Output   1825 ml  Net 1971.66 ml    Telemetry: Sinus rhythm and sinus tachycardia. Rates 100  Exam:  General: No distress.  Lungs: Diminished breath sounds at bases. Prolonged expiratory phase.  Cardiac: RRR, soft systolic murmur, no gallop.  Abdomen: protuberant, NT, decreased BS  Extremities: No pitting.  Lab Results:  Basic Metabolic Panel:  Recent Labs Lab 06/19/15 0517 06/20/15 0600 06/21/15 0900 06/22/15 0515  NA 140 138 PATIENT IDENTIFICATION ERROR. PLEASE DISREGARD RESULTS. ACCOUNT WILL BE CREDITED. 135  K 4.0 3.6 PATIENT IDENTIFICATION ERROR. PLEASE DISREGARD RESULTS. ACCOUNT WILL BE CREDITED. 3.1*  CL 113* 111 PATIENT IDENTIFICATION ERROR. PLEASE DISREGARD RESULTS. ACCOUNT WILL BE CREDITED. 108  CO2 17* 21* PATIENT IDENTIFICATION ERROR. PLEASE DISREGARD RESULTS. ACCOUNT WILL BE CREDITED. 20*  GLUCOSE 187* 263* PATIENT IDENTIFICATION ERROR. PLEASE DISREGARD RESULTS. ACCOUNT WILL BE CREDITED. 150*  BUN 14 17 PATIENT IDENTIFICATION ERROR. PLEASE DISREGARD RESULTS. ACCOUNT WILL BE CREDITED. 20  CREATININE 0.92 0.90  PATIENT IDENTIFICATION ERROR. PLEASE DISREGARD RESULTS. ACCOUNT WILL BE CREDITED. 0.86  CALCIUM 6.5* 6.1* PATIENT IDENTIFICATION ERROR. PLEASE DISREGARD RESULTS. ACCOUNT WILL BE CREDITED. 6.3*  MG 1.8 1.8  --  1.9    Liver Function Tests:  Recent Labs Lab 06/17/15 0425 06/18/15 0330 06/22/15 0515  AST 17 31 46*  ALT 11* 11* 21  ALKPHOS 267* 346* 327*  BILITOT 0.5 0.8 0.5  PROT 5.2* 5.0* 4.8*  ALBUMIN 1.7* 1.7* 1.5*    CBC:  Recent Labs Lab 06/20/15 2020 06/21/15 0450 06/22/15 0515  WBC 9.8 10.0 10.2  HGB 7.7* 7.6* 8.1*  HCT 26.6* 25.1* 24.7*  MCV 105.1* 99.6 89.8  PLT 196 184 217    Cardiac Enzymes:  Recent Labs Lab 06/19/15 1120  TROPONINI 0.04*    Medications:   Scheduled Medications: . acetaminophen  1,000 mg Oral TID  . bisacodyl  10 mg Rectal Daily  . furosemide  40 mg Intravenous Once  . heparin subcutaneous  5,000 Units Subcutaneous 3 times per day  . insulin aspart  0-15 Units Subcutaneous 6 times per day  . insulin glargine  10 Units Subcutaneous BID  . levalbuterol  0.63 mg Nebulization BID  . lip balm  1 application Topical BID  . metoprolol  10 mg Intravenous 4 times per day  . pantoprazole (PROTONIX) IV  40 mg Intravenous QHS  . piperacillin-tazobactam (ZOSYN)  IV  3.375 g Intravenous Q8H  . prednisoLONE acetate  1 drop Both Eyes BID  . saccharomyces boulardii  250 mg Oral BID  . vancomycin  1,250 mg Intravenous Q12H    Infusions: . Marland KitchenTPN (CLINIMIX-E) Adult 100 mL/hr at 06/21/15 1747   And  . fat emulsion 240 mL (06/21/15 1747)    PRN Medications: alum & mag hydroxide-simeth, bisacodyl, diphenhydrAMINE **OR** diphenhydrAMINE, magic mouthwash, menthol-cetylpyridinium, methocarbamol (ROBAXIN)  IV, morphine injection, ondansetron **OR** ondansetron (ZOFRAN) IV, phenol, sodium chloride   Assessment:   1. Sinus tachycardia. Most likely multifactorial in the setting of recent small bowel obstruction with discontinuation of typical oral  medications including Coreg and Cardizem CD, recent fevers, progressive anemia, suspected infection currently being managed with broad spectrum antibiotics. Currently on IV Lopressor Start home PO meds since taking PO  2. Hypertensive heart disease with moderate LVH and vigorous LVEF of 75% with mid cavity obliteration by recent echocardiogram.  3. Small bowel obstruction status post exploratory laparotomy with lysis of adhesions, postoperative day #9 Postoperative ileus as well.  4. History of GI bleed.  5. Essential hypertension, blood pressure mildly elevated at present.   Plan/Discussion:    Resume cardizem CD 360 mg and coreg 12.5 mg bid home meds since taking PO

## 2015-06-22 NOTE — Progress Notes (Signed)
Pharmacy Antibiotic Follow-up Note  Jeremy Jennings is a 79 y.o. year-old male admitted on 06/11/2015.  The patient is currently on day 4 of vancomycin and zosyn for pneumonia  Assessment/Plan: -decrease vancomycin to 1gm IV q12h -continue Zosyn 3.375g IV Q8H infused over 4hrs.   Temp (24hrs), Avg:98.1 F (36.7 C), Min:97.3 F (36.3 C), Max:100.4 F (38 C)   Recent Labs Lab 06/19/15 0517 06/20/15 0600 06/20/15 2020 06/21/15 0450 06/22/15 0515  WBC 9.3 10.3 9.8 10.0 10.2    Recent Labs Lab 06/18/15 0330 06/19/15 0517 06/20/15 0600 06/21/15 0900 06/22/15 0515  CREATININE 0.72 0.92 0.90 PATIENT IDENTIFICATION ERROR. PLEASE DISREGARD RESULTS. ACCOUNT WILL BE CREDITED. 0.86   Estimated Creatinine Clearance: 90 mL/min (by C-G formula based on Cr of 0.86).    No Known Allergies  Antimicrobials this admission: 12/9 >> vanc >>  12/9 >> ZEI >>  Levels/dose changes this admission: 12/12 1300 VT 23  Microbiology results: 12/8 blood x2: NGTD  12/9 urine: Enterococcus, pan sens  MRSA PCR neg  Thank you for allowing pharmacy to be a part of this patient's care.  Dolly Rias RPh 06/22/2015, 1:48 PM Pager [phone]

## 2015-06-22 NOTE — Progress Notes (Addendum)
PARENTERAL NUTRITION CONSULT NOTE - Follow Up  Pharmacy Consult for TPN Indication: Prolonged ileus  No Known Allergies  Patient Measurements: Height: 6' (182.9 cm) Weight: 231 lb 0.7 oz (104.8 kg) IBW/kg (Calculated) : 77.6 Adjusted Body Weight: 82.4 kg Usual Weight:  95.3 kg  Vital Signs: Temp: 97.6 F (36.4 C) (12/12 0800) Temp Source: Oral (12/12 0800) BP: 182/67 mmHg (12/12 0800) Pulse Rate: 104 (12/12 0800) Intake/Output from previous day: 12/11 0701 - 12/12 0700 In: 3916.7 [P.O.:680; IV Piggyback:650; TPN:2456.7] Out: 1825 [Urine:1825] Intake/Output from this shift:    Labs:  Recent Labs  06/20/15 2020 06/21/15 0450 06/22/15 0515  WBC 9.8 10.0 10.2  HGB 7.7* 7.6* 8.1*  HCT 26.6* 25.1* 24.7*  PLT 196 184 217  INR  --   --  1.24    Recent Labs  06/20/15 0600 06/21/15 0900 06/22/15 0515  NA 138 PATIENT IDENTIFICATION ERROR. PLEASE DISREGARD RESULTS. ACCOUNT WILL BE CREDITED. 135  K 3.6 PATIENT IDENTIFICATION ERROR. PLEASE DISREGARD RESULTS. ACCOUNT WILL BE CREDITED. 3.1*  CL 111 PATIENT IDENTIFICATION ERROR. PLEASE DISREGARD RESULTS. ACCOUNT WILL BE CREDITED. 108  CO2 21* PATIENT IDENTIFICATION ERROR. PLEASE DISREGARD RESULTS. ACCOUNT WILL BE CREDITED. 20*  GLUCOSE 263* PATIENT IDENTIFICATION ERROR. PLEASE DISREGARD RESULTS. ACCOUNT WILL BE CREDITED. 150*  BUN 17 PATIENT IDENTIFICATION ERROR. PLEASE DISREGARD RESULTS. ACCOUNT WILL BE CREDITED. 20  CREATININE 0.90 PATIENT IDENTIFICATION ERROR. PLEASE DISREGARD RESULTS. ACCOUNT WILL BE CREDITED. 0.86  CALCIUM 6.1* PATIENT IDENTIFICATION ERROR. PLEASE DISREGARD RESULTS. ACCOUNT WILL BE CREDITED. 6.3*  MG 1.8  --  1.9  PHOS 2.6  --  2.0*  PROT  --   --  4.8*  ALBUMIN  --   --  1.5*  AST  --   --  46*  ALT  --   --  21  ALKPHOS  --   --  327*  BILITOT  --   --  0.5  PREALBUMIN  --   --  2.4*  TRIG  --   --  149   Estimated Creatinine Clearance: 90 mL/min (by C-G formula based on Cr of 0.86).     Recent Labs  06/21/15 2308 06/22/15 0343 06/22/15 0758  GLUCAP 185* 147* 114*   Medications:  Scheduled:  . acetaminophen  1,000 mg Oral TID  . bisacodyl  10 mg Rectal Daily  . carvedilol  12.5 mg Oral BID WC  . diltiazem  360 mg Oral Daily  . furosemide  40 mg Intravenous Once  . heparin subcutaneous  5,000 Units Subcutaneous 3 times per day  . insulin aspart  0-15 Units Subcutaneous 6 times per day  . insulin glargine  10 Units Subcutaneous BID  . levalbuterol  0.63 mg Nebulization BID  . lip balm  1 application Topical BID  . pantoprazole (PROTONIX) IV  40 mg Intravenous QHS  . piperacillin-tazobactam (ZOSYN)  IV  3.375 g Intravenous Q8H  . potassium phosphate IVPB (mmol)  15 mmol Intravenous Once  . prednisoLONE acetate  1 drop Both Eyes BID  . saccharomyces boulardii  250 mg Oral BID  . vancomycin  1,250 mg Intravenous Q12H   Infusions:  . Marland KitchenTPN (CLINIMIX-E) Adult 100 mL/hr at 06/21/15 1747   And  . fat emulsion 240 mL (06/21/15 1747)   Insulin Requirements in past 24 hours:  26 unit Novolog SSI yesterday Lantus 10 units q12 30 units in TPN  Current Nutrition: full liquids started yesterday afternoon  IVF: none  Central access: PICC placed 12/6 TPN start  date: 12/6  ASSESSMENT                                                                                                          HPI: 79 year old male with widely metastatic prostate cancer who presented 12/1 with abdominal pain nausea and vomiting. CT scan has shown evidence of small bowel obstruction with possibly a closed loop obstruction. Follow-up x-ray showed no improvement in small bowel obstruction and underwent emergency laparotomy with small bowel resection on 12/2.  Has now developed SBO with internal hernia and has been managed conservatively. Today, 12/6, CCS has consulted pharmacy to begin TPN for prolonged ileus.  Significant events:  12/10: transferred to ICU 12/11: significant increase in SCr,  CBG's remain elevated  Today: 06/22/2015  Glucose:improved to 150 this am   Novolog/Lantus  Current regimen - Lantus 10 units q12, Novolog SSI moderate scale q4hr, 30 Units in TPN  Electrolytes:   Ca low  = 6.3, corrects to 8.3 with albumin 1.5 - had required supplementation.   Na low, K low  Mg and Phos low  Renal: SCr improved tp 0.86  LFTs: AlkPhos elevated, others wnl   TGs: 149  Prealbumin: low at 3.5 (12/6),4.4 (12/7), 2.4 (12/12)   NUTRITIONAL GOALS                                                                                             RD recs (12/2): Kcal: 2300-2500, Protein: 115-130 grams, Fluid: 2.2 L/day Clinimix E 5/15 at a goal rate of 174ml/hr + 20% fat emulsion at 23ml/hr to provide: 120g/day protein, 2184Kcal/day.  PLAN   K Phos 20mmol x 1 KCl IV 34meq  x4 Mag Sulfate 1gm IV x 1                                                                                                                       At 1800 today:  Continue Clinimix E 5/15 at goal rate 140ml/hr  20% fat emulsion at 43ml/hr.  TPN to contain standard multivitamins and trace elements.  change Lantus to 10 units once daily ,continue insulin 30 units/bag to TPN,   Continue moderate SSI q4h  BMet,  mag and phos in am  TPN lab panels on Mondays & Thursdays.  F/u daily.  Dolly Rias RPh 06/22/2015, 10:37 AM Pager [phone]

## 2015-06-22 NOTE — Progress Notes (Addendum)
[facility] Progress Note    Patient: Jeremy Jennings    [id number]  DOB: [birth year]     DOA: 06/11/2015 Date of Service: the patient was seen and examined on 06/22/2015  Subjective: No acute complaint, tolerating oral diet. Nutrition: Tolerating full liquids Activity: Getting out of bed to chair Last BM: Before surgery, passing gas  Assessment and Plan: 1. healthcare associated pneumonia, enterococcus UTI Blood culture remain negative For 3 days, urine culture positive for enterococcus-pansensitive,  Will discussed with surgery to Discontinue or change Foley catheter if possible. Continue on vancomycin and Zosyn, ONCE the patient is taking orally can be transitioned to [place] and Levaquin.  CT of the abdomen does not show any intra-abdominal abscess and shows possible ileus. CT chest PE is negative for PE. Shows left-sided infiltrate.  2.sinus tachycardia, hyperdynamic LV with mid cavity obliteration without LVOT   Heart rate Continues to remain elevated, transitioning to oral Cardizem as well as beta blocker since the patient is taking orally now.  2. Acute on chronic diastolic dysfunction Volume overload Patient is third spacing due to hypoalbuminemia Continue TED stocking  Discussed with cardiology and will give the patient 40 mg of IV Lasix 1  3.Chronic anemia. Recent GI bleed. Hemoglobin stable, transfuse when necessary for hemoglobin less than 7  CT abdomen negative for any intra-abdominal bleeding.  4. Small bowel obstruction, status post laparotomy and colectomy. Management as per surgery. Patient will be covered with IV Zosyn.  5. Type 2 diabetes mellitus. Patient is on insulin and sliding scale continue close monitoring. Blood sugar well controlled with increase in the Lantus dose. Continue monitoring  5. History of prostate cancer, castration resistant. Patient on Zytiga at home. Currently on hold due to ongoing surgical and medical issues.  6.  Malnutrition. Albumin level less than 2, prealbumin less than 10. Patient is on TPN per pharmacy while the patient is being nothing by mouth. Management per surgery.  7. Pedal edema. This is most likely third spacing in the setting of hypoalbuminemia. Doppler negative for DVT  8. Neck pain. Most likely muscle spasm. Resolved.  DVT Prophylaxis: subcutaneous Heparin Nutrition: TPN  Advance goals of care discussion: Full code  Antibiotics: Anti-infectives    Start     Dose/Rate Route Frequency Ordered Stop   06/20/15 0200  vancomycin (VANCOCIN) 1,250 mg in sodium chloride 0.9 % 250 mL IVPB     1,250 mg 166.7 mL/hr over 90 Minutes Intravenous Every 12 hours 06/19/15 1314     06/19/15 1400  vancomycin (VANCOCIN) 2,000 mg in sodium chloride 0.9 % 500 mL IVPB     2,000 mg 250 mL/hr over 120 Minutes Intravenous  Once 06/19/15 1313 06/19/15 1737   06/19/15 1315  piperacillin-tazobactam (ZOSYN) IVPB 3.375 g     3.375 g 12.5 mL/hr over 240 Minutes Intravenous Every 8 hours 06/19/15 1305     06/19/15 1100  cefTRIAXone (ROCEPHIN) 2 g in dextrose 5 % 50 mL IVPB  Status:  Discontinued     2 g 100 mL/hr over 30 Minutes Intravenous Every 24 hours 06/19/15 1020 06/19/15 1246   06/12/15 0930  cefoTEtan (CEFOTAN) 2 g in dextrose 5 % 50 mL IVPB     2 g 100 mL/hr over 30 Minutes Intravenous On call to O.R. 06/12/15 0852 06/12/15 1359      Family Communication: family was present at bedside, at the time of interview.  Opportunity was given to ask question and all questions were answered satisfactorily.   Disposition:  We will continue to follow the patient. Patient appears STABLE to be transferred out of the stepdown unit.    Intake/Output Summary (Last 24 hours) at 06/22/15 1331 Last data filed at 06/22/15 1200  Gross per 24 hour  Intake   4235 ml  Output   1575 ml  Net   2660 ml   Filed Weights   06/20/15 0943 06/21/15 0500 06/22/15 0500  Weight: 99.8 kg (220 lb 0.3 oz) 100.9 kg  (222 lb 7.1 oz) 104.8 kg (231 lb 0.7 oz)    Objective: Physical Exam: Filed Vitals:   06/22/15 0500 06/22/15 0800 06/22/15 0859 06/22/15 1200  BP:  182/67  145/64  Pulse:  104  105  Temp:  97.6 F (36.4 C)  97.5 F (36.4 C)  TempSrc:  Oral  Oral  Resp:  27  25  Height:      Weight: 104.8 kg (231 lb 0.7 oz)     SpO2:  99% 99% 98%   General: Appear in mild distress, no Rash; Oral Mucosa moist. Cardiovascular: S1 and S2 Present, no Murmur Respiratory: Bilateral Air entry present and left basal Crackles, no wheezes Abdomen: Bowel Sound absent, Soft and no tenderness Extremities: Bilateral Pedal edema, no calf tenderness  Data Reviewed: CBC:  Recent Labs Lab 06/17/15 0425 06/19/15 0517 06/20/15 0600 06/20/15 2020 06/21/15 0450 06/22/15 0515  WBC 5.4 9.3 10.3 9.8 10.0 10.2  NEUTROABS 3.8 7.0  --   --   --  8.4*  HGB 8.6* 8.6* 7.0* 7.7* 7.6* 8.1*  HCT 26.3* 28.3* 21.5* 26.6* 25.1* 24.7*  MCV 89.5 93.7 89.2 105.1* 99.6 89.8  PLT 319 266 219 196 184 [id number]   Basic Metabolic Panel:  Recent Labs Lab 06/17/15 0425 06/18/15 0330 06/19/15 0517 06/20/15 0600 06/21/15 0900 06/22/15 0515  NA 146* 143 140 138 PATIENT IDENTIFICATION ERROR. PLEASE DISREGARD RESULTS. ACCOUNT WILL BE CREDITED. 135  K 3.7 3.7 4.0 3.6 PATIENT IDENTIFICATION ERROR. PLEASE DISREGARD RESULTS. ACCOUNT WILL BE CREDITED. 3.1*  CL 117* 116* 113* 111 PATIENT IDENTIFICATION ERROR. PLEASE DISREGARD RESULTS. ACCOUNT WILL BE CREDITED. 108  CO2 22 23 17* 21* PATIENT IDENTIFICATION ERROR. PLEASE DISREGARD RESULTS. ACCOUNT WILL BE CREDITED. 20*  GLUCOSE 163* 182* 187* 263* PATIENT IDENTIFICATION ERROR. PLEASE DISREGARD RESULTS. ACCOUNT WILL BE CREDITED. 150*  BUN 10 10 14 17  PATIENT IDENTIFICATION ERROR. PLEASE DISREGARD RESULTS. ACCOUNT WILL BE CREDITED. 20  CREATININE 0.76 0.72 0.92 0.90 PATIENT IDENTIFICATION ERROR. PLEASE DISREGARD RESULTS. ACCOUNT WILL BE CREDITED. 0.86  CALCIUM 6.9* 6.6* 6.5* 6.1* PATIENT  IDENTIFICATION ERROR. PLEASE DISREGARD RESULTS. ACCOUNT WILL BE CREDITED. 6.3*  MG 1.9 1.8 1.8 1.8  --  1.9  PHOS 1.0* 1.9* 2.5 2.6  --  2.0*   Liver Function Tests:  Recent Labs Lab 06/16/15 0421 06/17/15 0425 06/18/15 0330 06/22/15 0515  AST 19 17 31  46*  ALT 13* 11* 11* 21  ALKPHOS 290* 267* 346* 327*  BILITOT 0.6 0.5 0.8 0.5  PROT 5.2* 5.2* 5.0* 4.8*  ALBUMIN 1.8* 1.7* 1.7* 1.5*   No results for input(s): LIPASE, AMYLASE in the last 168 hours. No results for input(s): AMMONIA in the last 168 hours.  Cardiac Enzymes:  Recent Labs Lab 06/19/15 1120  TROPONINI 0.04*   BNP (last 3 results)  Recent Labs  06/19/15 1120  BNP 258.8*   CBG:  Recent Labs Lab 06/21/15 2034 06/21/15 2308 06/22/15 0343 06/22/15 0758 06/22/15 1200  GLUCAP 232* 185* 147* 114* 153*    Recent Results (from the past  240 hour(s))  Culture, blood (routine x 2)     Status: None (Preliminary result)   Collection Time: 06/18/15  9:11 PM  Result Value Ref Range Status   Specimen Description BLOOD LEFT ASSIST CONTROL  Final   Special Requests BOTTLES DRAWN AEROBIC ONLY 5ML  Final   Culture   Final    NO GROWTH 3 DAYS Performed at [facility]    Report Status PENDING  Incomplete  Culture, blood (routine x 2)     Status: None (Preliminary result)   Collection Time: 06/18/15  9:15 PM  Result Value Ref Range Status   Specimen Description BLOOD LEFT HAND  Final   Special Requests BOTTLES DRAWN AEROBIC ONLY 5ML  Final   Culture   Final    NO GROWTH 3 DAYS Performed at [facility]    Report Status PENDING  Incomplete  Culture, Urine     Status: None   Collection Time: 06/19/15 10:59 AM  Result Value Ref Range Status   Specimen Description URINE, CLEAN CATCH  Final   Special Requests NONE  Final   Culture   Final    >=100,000 COLONIES/mL ENTEROCOCCUS SPECIES Performed at [facility]    Report Status 06/21/2015 FINAL  Final   Organism ID, Bacteria ENTEROCOCCUS  SPECIES  Final      Susceptibility   Enterococcus species - MIC*    AMPICILLIN <=2 SENSITIVE Sensitive     LEVOFLOXACIN 1 SENSITIVE Sensitive     NITROFURANTOIN <=16 SENSITIVE Sensitive     VANCOMYCIN 1 SENSITIVE Sensitive     * >=100,000 COLONIES/mL ENTEROCOCCUS SPECIES     Studies: No results found.   Scheduled Meds: . acetaminophen  1,000 mg Oral TID  . bisacodyl  10 mg Rectal Daily  . carvedilol  12.5 mg Oral BID WC  . diltiazem  360 mg Oral Daily  . heparin subcutaneous  5,000 Units Subcutaneous 3 times per day  . insulin aspart  0-15 Units Subcutaneous 6 times per day  . [START ON 06/23/2015] insulin glargine  10 Units Subcutaneous Daily  . levalbuterol  0.63 mg Nebulization BID  . lip balm  1 application Topical BID  . pantoprazole (PROTONIX) IV  40 mg Intravenous QHS  . piperacillin-tazobactam (ZOSYN)  IV  3.375 g Intravenous Q8H  . potassium chloride  10 mEq Intravenous Q1 Hr x 4  . potassium phosphate IVPB (mmol)  15 mmol Intravenous Once  . prednisoLONE acetate  1 drop Both Eyes BID  . saccharomyces boulardii  250 mg Oral BID  . vancomycin  1,250 mg Intravenous Q12H   Continuous Infusions: . Marland KitchenTPN (CLINIMIX-E) Adult 100 mL/hr at 06/22/15 1200   And  . fat emulsion 240 mL (06/22/15 1200)  . Marland KitchenTPN (CLINIMIX-E) Adult     And  . fat emulsion     PRN Meds: alum & mag hydroxide-simeth, bisacodyl, diphenhydrAMINE **OR** diphenhydrAMINE, magic mouthwash, menthol-cetylpyridinium, methocarbamol (ROBAXIN)  IV, morphine injection, ondansetron **OR** ondansetron (ZOFRAN) IV, phenol, sodium chloride  Time spent: 30 minutes  Author: Berle Mull, MD [facility] Pager: [phone] 06/22/2015 1:31 PM  If 7PM-7AM, please contact night-coverage at [email or web address], password [facility]

## 2015-06-22 NOTE — Progress Notes (Addendum)
Physical Therapy Treatment Patient Details Name: Jeremy Jennings MRN: [id number] DOB: 12-27-1935 Today's Date: 06/22/2015    History of Present Illness 79 yo black male who has a history of stage 4 prostate cancer, HTN, DM, tachycardia, recent GI bleed.  Pt admitted 12/1 with centralized crampy abdominal pain.  Due to persistent pain, the patient presented to [facility] for further evaluation.Dx of SBO, s/p resection 06/12/15.  High Temp 06/21/15, to SDU.    PT Comments    Patient is much weaker and unsteady than last visit where patient ambulated x 350'. Patient may require SNF at DC for rehab. Previously, family reported that 24/7 caregivers would be hired. No family present to discuss DC plans and patient's current  Functional Status. And AMS. Patient had thought that he had just gotten into bed. BP prior 182/67 HR 120, sats 99 on RA  Follow Up Recommendations  SNF;Supervision/Assistance - 24 hour     Equipment Recommendations  None recommended by PT    Recommendations for Other Services       Precautions / Restrictions Precautions Precautions: Fall Precaution Comments: much weaker than last week.    Mobility  Bed Mobility Overal bed mobility: Needs Assistance Bed Mobility: Rolling;Sidelying to Sit Rolling: Max assist;+2 for physical assistance;+2 for safety/equipment Sidelying to sit: Max assist;+2 for physical assistance;+2 for safety/equipment;HOB elevated       General bed mobility comments: cues for technique, multiimodal cues to stay on task.  Transfers Overall transfer level: Needs assistance Equipment used: Rolling walker (2 wheeled) Transfers: Sit to/from [facility] Sit to Stand: Mod assist;+2 physical assistance;+2 safety/equipment Stand pivot transfers: Mod assist;+2 physical assistance;+2 safety/equipment       General transfer comment: multimodal cues for safety, assist to power up to stand at the RW. small pivot steps to recliner with  multimodal ues for safety. Noted  knees wobbly.   Ambulation/Gait                 Stairs            Wheelchair Mobility    Modified Rankin (Stroke Patients Only)       Balance                                    Cognition Arousal/Alertness: Awake/alert Behavior During Therapy: Anxious;Restless Overall Cognitive Status: Impaired/Different from baseline Area of Impairment: Orientation Orientation Level: Situation;Time             General Comments: patient is not as coherent as last treatment on 12/7.    Exercises      General Comments        Pertinent Vitals/Pain Pain Assessment: Faces Faces Pain Scale: Hurts even more Pain Location: abdomen Pain Descriptors / Indicators: Aching;Discomfort;Grimacing;Guarding Pain Intervention(s): Limited activity within patient's tolerance;Monitored during session;Repositioned    Home Living                      Prior Function            PT Goals (current goals can now be found in the care plan section) Progress towards PT goals: Not progressing toward goals - comment (has  digressed  since  last visit, worsening medical status)    Frequency  Min 3X/week    PT Plan Discharge plan needs to be updated    Co-evaluation  End of Session Equipment Utilized During Treatment: Gait belt Activity Tolerance: Patient limited by fatigue;Patient limited by pain Patient left: in chair;with call bell/phone within reach;with chair alarm set     Time: [phone] PT Time Calculation (min) (ACUTE ONLY): 19 min  Charges:  $Therapeutic Activity: 8-22 mins                    G Codes:      Claretha Cooper 06/22/2015, 1:30 PM Tresa Endo PT [phone]

## 2015-06-22 NOTE — Progress Notes (Signed)
Patient ID: Jeremy Jennings, male   DOB: [birth year], 79 y.o.   MRN: [id number]  [facility]  POD#: 10  Subjective: Patient in bed in stepdown unit, comfortable, no complaints.  Passing flatus.  Tolerating full liquids.  Objective: Vital signs in last 24 hours: Temp:  [97.3 F (36.3 C)-100.4 F (38 C)] 97.6 F (36.4 C) (12/12 0800) Pulse Rate:  [91-118] 104 (12/12 0800) Resp:  [19-35] 27 (12/12 0800) BP: (111-182)/(49-73) 182/67 mmHg (12/12 0800) SpO2:  [95 %-100 %] 99 % (12/12 0859) FiO2 (%):  [21 %] 21 % (12/11 1422) Weight:  [104.8 kg (231 lb 0.7 oz)] 104.8 kg (231 lb 0.7 oz) (12/12 0500) Last BM Date: 06/05/15  Intake/Output from previous day: 12/11 0701 - 12/12 0700 In: 3916.7 [P.O.:680; IV Piggyback:650; TPN:2456.7] Out: 1825 [Urine:1825] Intake/Output this shift:    Physical Exam: HEENT - sclerae clear, mucous membranes moist Neck - soft Chest - clear bilaterally but shallow Cor - RRR Abdomen - soft, obese, mild distension; active BS present; midline incision intact with staples in place  Lab Results:   Recent Labs  06/21/15 0450 06/22/15 0515  WBC 10.0 10.2  HGB 7.6* 8.1*  HCT 25.1* 24.7*  PLT 184 217   BMET  Recent Labs  06/21/15 0900 06/22/15 0515  NA PATIENT IDENTIFICATION ERROR. PLEASE DISREGARD RESULTS. ACCOUNT WILL BE CREDITED. 135  K PATIENT IDENTIFICATION ERROR. PLEASE DISREGARD RESULTS. ACCOUNT WILL BE CREDITED. 3.1*  CL PATIENT IDENTIFICATION ERROR. PLEASE DISREGARD RESULTS. ACCOUNT WILL BE CREDITED. 108  CO2 PATIENT IDENTIFICATION ERROR. PLEASE DISREGARD RESULTS. ACCOUNT WILL BE CREDITED. 20*  GLUCOSE PATIENT IDENTIFICATION ERROR. PLEASE DISREGARD RESULTS. ACCOUNT WILL BE CREDITED. 150*  BUN PATIENT IDENTIFICATION ERROR. PLEASE DISREGARD RESULTS. ACCOUNT WILL BE CREDITED. 20  CREATININE PATIENT IDENTIFICATION ERROR. PLEASE DISREGARD RESULTS. ACCOUNT WILL BE CREDITED. 0.86  CALCIUM PATIENT IDENTIFICATION  ERROR. PLEASE DISREGARD RESULTS. ACCOUNT WILL BE CREDITED. 6.3*   PT/INR  Recent Labs  06/22/15 0515  LABPROT 15.8*  INR 1.24   Comprehensive Metabolic Panel:    Component Value Date/Time   NA 135 06/22/2015 0515   NA  06/21/2015 0900    PATIENT IDENTIFICATION ERROR. PLEASE DISREGARD RESULTS. ACCOUNT WILL BE CREDITED.   NA 142 05/18/2015 0838   NA 138 04/22/2015 1509   K 3.1* 06/22/2015 0515   K  06/21/2015 0900    PATIENT IDENTIFICATION ERROR. PLEASE DISREGARD RESULTS. ACCOUNT WILL BE CREDITED.   K 3.5 05/18/2015 0838   K 3.5 04/22/2015 1509   CL 108 06/22/2015 0515   CL  06/21/2015 0900    PATIENT IDENTIFICATION ERROR. PLEASE DISREGARD RESULTS. ACCOUNT WILL BE CREDITED.   CO2 20* 06/22/2015 0515   CO2  06/21/2015 0900    PATIENT IDENTIFICATION ERROR. PLEASE DISREGARD RESULTS. ACCOUNT WILL BE CREDITED.   CO2 18* 05/18/2015 0838   CO2 22 04/22/2015 1509   BUN 20 06/22/2015 0515   BUN  06/21/2015 0900    PATIENT IDENTIFICATION ERROR. PLEASE DISREGARD RESULTS. ACCOUNT WILL BE CREDITED.   BUN 18.3 05/18/2015 0838   BUN 16.2 04/22/2015 1509   CREATININE 0.86 06/22/2015 0515   CREATININE  06/21/2015 0900    PATIENT IDENTIFICATION ERROR. PLEASE DISREGARD RESULTS. ACCOUNT WILL BE CREDITED.   CREATININE 0.8 05/18/2015 0838   CREATININE 0.9 04/22/2015 1509   GLUCOSE 150* 06/22/2015 0515   GLUCOSE  06/21/2015 0900    PATIENT IDENTIFICATION ERROR. PLEASE DISREGARD RESULTS. ACCOUNT WILL BE CREDITED.   GLUCOSE 84 05/18/2015 0838  GLUCOSE 288* 04/22/2015 1509   CALCIUM 6.3* 06/22/2015 0515   CALCIUM  06/21/2015 0900    PATIENT IDENTIFICATION ERROR. PLEASE DISREGARD RESULTS. ACCOUNT WILL BE CREDITED.   CALCIUM 8.4 05/18/2015 0838   CALCIUM 7.9* 04/22/2015 1509   AST 46* 06/22/2015 0515   AST 31 06/18/2015 0330   AST 18 05/18/2015 0838   AST 15 04/22/2015 1509   ALT 21 06/22/2015 0515   ALT 11* 06/18/2015 0330   ALT <9 05/18/2015 0838   ALT <9 04/22/2015 1509   ALKPHOS  327* 06/22/2015 0515   ALKPHOS 346* 06/18/2015 0330   ALKPHOS 488* 05/18/2015 0838   ALKPHOS 499* 04/22/2015 1509   BILITOT 0.5 06/22/2015 0515   BILITOT 0.8 06/18/2015 0330   BILITOT 0.33 05/18/2015 0838   BILITOT 0.37 04/22/2015 1509   PROT 4.8* 06/22/2015 0515   PROT 5.0* 06/18/2015 0330   PROT 6.5 05/18/2015 0838   PROT 5.9* 04/22/2015 1509   ALBUMIN 1.5* 06/22/2015 0515   ALBUMIN 1.7* 06/18/2015 0330   ALBUMIN 2.4* 05/18/2015 0838   ALBUMIN 2.2* 04/22/2015 1509    Studies/Results: No results found.  Anti-infectives: Anti-infectives    Start     Dose/Rate Route Frequency Ordered Stop   06/20/15 0200  vancomycin (VANCOCIN) 1,250 mg in sodium chloride 0.9 % 250 mL IVPB     1,250 mg 166.7 mL/hr over 90 Minutes Intravenous Every 12 hours 06/19/15 1314     06/19/15 1400  vancomycin (VANCOCIN) 2,000 mg in sodium chloride 0.9 % 500 mL IVPB     2,000 mg 250 mL/hr over 120 Minutes Intravenous  Once 06/19/15 1313 06/19/15 1737   06/19/15 1315  piperacillin-tazobactam (ZOSYN) IVPB 3.375 g     3.375 g 12.5 mL/hr over 240 Minutes Intravenous Every 8 hours 06/19/15 1305     06/19/15 1100  cefTRIAXone (ROCEPHIN) 2 g in dextrose 5 % 50 mL IVPB  Status:  Discontinued     2 g 100 mL/hr over 30 Minutes Intravenous Every 24 hours 06/19/15 1020 06/19/15 1246   06/12/15 0930  cefoTEtan (CEFOTAN) 2 g in dextrose 5 % 50 mL IVPB     2 g 100 mL/hr over 30 Minutes Intravenous On call to O.R. 06/12/15 0852 06/12/15 1359      Assessment & Plans: Small bowel obstruction with internal hernia S/p exploratory laparotomy with SB resection from strangulated SBO, 06/12/15, Dr. [name]  Sepsis, Due to UTI, possible pneumonia - improving on Zosyn/Vanco  Post op ileus- on TPN, full liquid diet  Tachycardia - On Coreg and Diltiazem - will resume per cardiology  AODM - Insulin dependant  Prostate cancer ongoing treatment  Hypertension  VTE proph: SCD/heparin  Needs mobilization, OOB, ambulation -  physical therapy involved.  Earnstine Regal, MD, [facility] Office: [place] 06/22/2015

## 2015-06-23 ENCOUNTER — Inpatient Hospital Stay (HOSPITAL_COMMUNITY): Payer: Medicare Other

## 2015-06-23 ENCOUNTER — Inpatient Hospital Stay (HOSPITAL_COMMUNITY): Payer: Medicare Other | Admitting: Anesthesiology

## 2015-06-23 DIAGNOSIS — I110 Hypertensive heart disease with heart failure: Secondary | ICD-10-CM

## 2015-06-23 DIAGNOSIS — J9600 Acute respiratory failure, unspecified whether with hypoxia or hypercapnia: Secondary | ICD-10-CM

## 2015-06-23 DIAGNOSIS — R0600 Dyspnea, unspecified: Secondary | ICD-10-CM

## 2015-06-23 DIAGNOSIS — K5669 Other intestinal obstruction: Secondary | ICD-10-CM

## 2015-06-23 LAB — BASIC METABOLIC PANEL
Anion gap: 7 (ref 5–15)
Anion gap: 7 (ref 5–15)
Anion gap: 7 (ref 5–15)
BUN: 24 mg/dL — AB (ref 6–20)
BUN: 25 mg/dL — AB (ref 6–20)
BUN: 31 mg/dL — ABNORMAL HIGH (ref 6–20)
CALCIUM: 6.5 mg/dL — AB (ref 8.9–10.3)
CHLORIDE: 107 mmol/L (ref 101–111)
CHLORIDE: 109 mmol/L (ref 101–111)
CHLORIDE: 108 mmol/L (ref 101–111)
CO2: 20 mmol/L — AB (ref 22–32)
CO2: 21 mmol/L — AB (ref 22–32)
CO2: 19 mmol/L — AB (ref 22–32)
CREATININE: 1.08 mg/dL (ref 0.61–1.24)
CREATININE: 1.04 mg/dL (ref 0.61–1.24)
Calcium: 6.5 mg/dL — ABNORMAL LOW (ref 8.9–10.3)
Calcium: 6.7 mg/dL — ABNORMAL LOW (ref 8.9–10.3)
Creatinine, Ser: 0.93 mg/dL (ref 0.61–1.24)
GFR calc Af Amer: >60 mL/min (ref >60)
GFR calc Af Amer: >60 mL/min (ref >60)
GFR calc Af Amer: >60 mL/min (ref >60)
GFR calc non Af Amer: >60 mL/min (ref >60)
GFR calc non Af Amer: >60 mL/min (ref >60)
GFR calc non Af Amer: >60 mL/min (ref >60)
GLUCOSE: 203 mg/dL — AB (ref 65–99)
GLUCOSE: 309 mg/dL — AB (ref 65–99)
Glucose, Bld: 226 mg/dL — ABNORMAL HIGH (ref 65–99)
POTASSIUM: 3.3 mmol/L — AB (ref 3.5–5.1)
POTASSIUM: 4.6 mmol/L (ref 3.5–5.1)
Potassium: 4.2 mmol/L (ref 3.5–5.1)
SODIUM: 134 mmol/L — AB (ref 135–145)
SODIUM: 137 mmol/L (ref 135–145)
Sodium: 134 mmol/L — ABNORMAL LOW (ref 135–145)

## 2015-06-23 LAB — CBC
HEMATOCRIT: 25.5 % — AB (ref 39.0–52.0)
Hemoglobin: 8 g/dL — ABNORMAL LOW (ref 13.0–17.0)
MCH: 28.8 pg (ref 26.0–34.0)
MCHC: 31.4 g/dL (ref 30.0–36.0)
MCV: 91.7 fL (ref 78.0–100.0)
PLATELETS: 250 10*3/uL (ref 150–400)
RBC: 2.78 MIL/uL — AB (ref 4.22–5.81)
RDW: 17 % — AB (ref 11.5–15.5)
WBC: 10.9 10*3/uL — AB (ref 4.0–10.5)

## 2015-06-23 LAB — BLOOD GAS, ARTERIAL
ACID-BASE DEFICIT: 4.9 mmol/L — AB (ref 0.0–2.0)
Acid-base deficit: 6.5 mmol/L — ABNORMAL HIGH (ref 0.0–2.0)
BICARBONATE: 19.1 meq/L — AB (ref 20.0–24.0)
BICARBONATE: 18.2 meq/L — AB (ref 20.0–24.0)
Drawn by: 276051
Drawn by: 441261
FIO2: 1
LHR: 20 {breaths}/min
O2 CONTENT: 2 L/min
O2 SAT: 99.6 %
O2 Saturation: 96.5 %
PATIENT TEMPERATURE: 98.6
PATIENT TEMPERATURE: 97
PCO2 ART: 33 mmHg — AB (ref 35.0–45.0)
PCO2 ART: 33.6 mmHg — AB (ref 35.0–45.0)
PEEP: 5 cmH2O
PH ART: 7.379 (ref 7.350–7.450)
PH ART: 7.347 — AB (ref 7.350–7.450)
PO2 ART: 91.6 mmHg (ref 80.0–100.0)
PO2 ART: 321 mmHg — AB (ref 80.0–100.0)
TCO2: 18.1 mmol/L (ref 0–100)
TCO2: 17.6 mmol/L (ref 0–100)
VT: 620 mL

## 2015-06-23 LAB — CULTURE, BLOOD (ROUTINE X 2)
Culture: NO GROWTH
Culture: NO GROWTH

## 2015-06-23 LAB — GLUCOSE, CAPILLARY
GLUCOSE-CAPILLARY: 146 mg/dL — AB (ref 65–99)
GLUCOSE-CAPILLARY: 199 mg/dL — AB (ref 65–99)
GLUCOSE-CAPILLARY: 187 mg/dL — AB (ref 65–99)
GLUCOSE-CAPILLARY: 308 mg/dL — AB (ref 65–99)
Glucose-Capillary: 195 mg/dL — ABNORMAL HIGH (ref 65–99)
Glucose-Capillary: 193 mg/dL — ABNORMAL HIGH (ref 65–99)
Glucose-Capillary: 176 mg/dL — ABNORMAL HIGH (ref 65–99)
Glucose-Capillary: 240 mg/dL — ABNORMAL HIGH (ref 65–99)

## 2015-06-23 LAB — PHOSPHORUS: PHOSPHORUS: 2.5 mg/dL (ref 2.5–4.6)

## 2015-06-23 LAB — LACTIC ACID, PLASMA: Lactic Acid, Venous: 1.6 mmol/L (ref 0.5–2.0)

## 2015-06-23 LAB — BRAIN NATRIURETIC PEPTIDE: B Natriuretic Peptide: 147.8 pg/mL — ABNORMAL HIGH (ref 0.0–100.0)

## 2015-06-23 LAB — FIBRINOGEN: Fibrinogen: 692 mg/dL — ABNORMAL HIGH (ref 204–475)

## 2015-06-23 LAB — PROTIME-INR
INR: 1.3 (ref 0.00–1.49)
PROTHROMBIN TIME: 16.3 s — AB (ref 11.6–15.2)

## 2015-06-23 LAB — TROPONIN I: Troponin I: <0.03 ng/mL (ref <0.031)

## 2015-06-23 LAB — MAGNESIUM: Magnesium: 1.9 mg/dL (ref 1.7–2.4)

## 2015-06-23 MED ORDER — SUCCINYLCHOLINE CHLORIDE 20 MG/ML IJ SOLN
INTRAMUSCULAR | Status: DC | PRN
  Administered 2015-06-23: 100 mg via INTRAVENOUS

## 2015-06-23 MED ORDER — VANCOMYCIN HCL IN DEXTROSE 1-5 GM/200ML-% IV SOLN
1000.0000 mg | Freq: Two times a day (BID) | INTRAVENOUS | Status: DC
  Administered 2015-06-23 – 2015-06-26 (×6): 1000 mg via INTRAVENOUS
  Filled 2015-06-23 (×6): qty 200

## 2015-06-23 MED ORDER — MIDAZOLAM HCL 2 MG/2ML IJ SOLN
INTRAMUSCULAR | Status: AC
  Administered 2015-06-23: 2 mg via INTRAVENOUS
  Filled 2015-06-23: qty 2

## 2015-06-23 MED ORDER — BUMETANIDE 0.25 MG/ML IJ SOLN
1.0000 mg | Freq: Once | INTRAMUSCULAR | Status: AC
  Administered 2015-06-23: 1 mg via INTRAVENOUS
  Filled 2015-06-23: qty 4

## 2015-06-23 MED ORDER — POTASSIUM CHLORIDE 10 MEQ/50ML IV SOLN: 10.0000 meq | INTRAVENOUS | Status: DC

## 2015-06-23 MED ORDER — METOPROLOL TARTRATE 12.5 MG HALF TABLET
12.5000 mg | ORAL_TABLET | Freq: Two times a day (BID) | ORAL | Status: DC
  Administered 2015-06-24 – 2015-06-25 (×2): 12.5 mg via ORAL
  Filled 2015-06-23 (×2): qty 1

## 2015-06-23 MED ORDER — ALBUMIN HUMAN 25 % IV SOLN
12.5000 g | Freq: Once | INTRAVENOUS | Status: AC
  Administered 2015-06-23: 12.5 g via INTRAVENOUS
  Filled 2015-06-23: qty 50

## 2015-06-23 MED ORDER — PIPERACILLIN-TAZOBACTAM 3.375 G IVPB
3.3750 g | Freq: Three times a day (TID) | INTRAVENOUS | Status: AC
  Administered 2015-06-23 – 2015-06-29 (×20): 3.375 g via INTRAVENOUS
  Filled 2015-06-23 (×19): qty 50

## 2015-06-23 MED ORDER — IPRATROPIUM BROMIDE 0.02 % IN SOLN: 0.5000 mg | Freq: Four times a day (QID) | RESPIRATORY_TRACT | Status: DC

## 2015-06-23 MED ORDER — FENTANYL CITRATE (PF) 100 MCG/2ML IJ SOLN: 100.0000 ug | Freq: Once | INTRAMUSCULAR | Status: DC

## 2015-06-23 MED ORDER — SENNOSIDES 8.8 MG/5ML PO SYRP: 5.0000 mL | ORAL_SOLUTION | Freq: Two times a day (BID) | ORAL | Status: DC | PRN

## 2015-06-23 MED ORDER — MIDAZOLAM HCL 2 MG/2ML IJ SOLN: 2.0000 mg | Freq: Once | INTRAMUSCULAR | Status: DC

## 2015-06-23 MED ORDER — NOREPINEPHRINE BITARTRATE 1 MG/ML IV SOLN
0.0000 ug/min | INTRAVENOUS | Status: DC
  Filled 2015-06-23: qty 4

## 2015-06-23 MED ORDER — METHYLPREDNISOLONE SODIUM SUCC 125 MG IJ SOLR
60.0000 mg | Freq: Two times a day (BID) | INTRAMUSCULAR | Status: DC
  Administered 2015-06-23 – 2015-06-26 (×6): 60 mg via INTRAVENOUS
  Filled 2015-06-23 (×6): qty 2

## 2015-06-23 MED ORDER — LEVALBUTEROL HCL 1.25 MG/0.5ML IN NEBU
1.2500 mg | INHALATION_SOLUTION | Freq: Once | RESPIRATORY_TRACT | Status: AC
  Administered 2015-06-23: 1.25 mg via RESPIRATORY_TRACT
  Filled 2015-06-23: qty 0.5

## 2015-06-23 MED ORDER — BISACODYL 10 MG RE SUPP: 10.0000 mg | Freq: Every day | RECTAL | Status: DC | PRN

## 2015-06-23 MED ORDER — INSULIN ASPART 100 UNIT/ML ~~LOC~~ SOLN
0.0000 [IU] | SUBCUTANEOUS | Status: DC
  Administered 2015-06-23: 11 [IU] via SUBCUTANEOUS
  Administered 2015-06-23: 3 [IU] via SUBCUTANEOUS
  Administered 2015-06-24: 15 [IU] via SUBCUTANEOUS
  Administered 2015-06-24 (×2): 8 [IU] via SUBCUTANEOUS

## 2015-06-23 MED ORDER — FENTANYL CITRATE (PF) 100 MCG/2ML IJ SOLN
INTRAMUSCULAR | Status: AC
  Administered 2015-06-23: 100 ug
  Filled 2015-06-23: qty 2

## 2015-06-23 MED ORDER — NITROFURANTOIN MONOHYD MACRO 100 MG PO CAPS
100.0000 mg | ORAL_CAPSULE | Freq: Two times a day (BID) | ORAL | Status: DC
  Administered 2015-06-23: 100 mg via ORAL
  Filled 2015-06-23 (×2): qty 1

## 2015-06-23 MED ORDER — SODIUM CHLORIDE 0.9 % IV SOLN: 75.0000 mg/kg | Freq: Three times a day (TID) | INTRAVENOUS | Status: DC

## 2015-06-23 MED ORDER — IPRATROPIUM-ALBUTEROL 0.5-2.5 (3) MG/3ML IN SOLN
3.0000 mL | RESPIRATORY_TRACT | Status: DC
  Administered 2015-06-23 – 2015-06-27 (×22): 3 mL via RESPIRATORY_TRACT
  Filled 2015-06-23 (×23): qty 3

## 2015-06-23 MED ORDER — POTASSIUM CHLORIDE CRYS ER 20 MEQ PO TBCR
40.0000 meq | EXTENDED_RELEASE_TABLET | Freq: Two times a day (BID) | ORAL | Status: AC
  Administered 2015-06-23: 40 meq via ORAL
  Filled 2015-06-23: qty 2

## 2015-06-23 MED ORDER — IPRATROPIUM BROMIDE 0.02 % IN SOLN: 0.5000 mg | RESPIRATORY_TRACT | Status: DC

## 2015-06-23 MED ORDER — MAGNESIUM SULFATE IN D5W 10-5 MG/ML-% IV SOLN
1.0000 g | Freq: Once | INTRAVENOUS | Status: AC
  Administered 2015-06-23: 1 g via INTRAVENOUS
  Filled 2015-06-23: qty 100

## 2015-06-23 MED ORDER — FENTANYL BOLUS VIA INFUSION
25.0000 ug | INTRAVENOUS | Status: DC | PRN
  Administered 2015-06-24 (×4): 25 ug via INTRAVENOUS
  Filled 2015-06-23: qty 25

## 2015-06-23 MED ORDER — GUAIFENESIN ER 600 MG PO TB12
600.0000 mg | ORAL_TABLET | Freq: Two times a day (BID) | ORAL | Status: DC
  Administered 2015-06-23: 600 mg via ORAL
  Filled 2015-06-23: qty 1

## 2015-06-23 MED ORDER — DILTIAZEM HCL 60 MG PO TABS
60.0000 mg | ORAL_TABLET | Freq: Four times a day (QID) | ORAL | Status: DC
  Administered 2015-06-24 – 2015-06-25 (×4): 60 mg via ORAL
  Filled 2015-06-23 (×4): qty 1

## 2015-06-23 MED ORDER — LEVALBUTEROL HCL 0.63 MG/3ML IN NEBU: 0.6300 mg | INHALATION_SOLUTION | Freq: Four times a day (QID) | RESPIRATORY_TRACT | Status: DC

## 2015-06-23 MED ORDER — ALBUTEROL SULFATE (2.5 MG/3ML) 0.083% IN NEBU
2.5000 mg | INHALATION_SOLUTION | RESPIRATORY_TRACT | Status: DC
Start: 2015-06-23 — End: 2015-06-23

## 2015-06-23 MED ORDER — TRACE MINERALS CR-CU-MN-SE-ZN 10-1000-500-60 MCG/ML IV SOLN
INTRAVENOUS | Status: AC
  Administered 2015-06-23: 17:00:00 via INTRAVENOUS
  Filled 2015-06-23: qty 2400

## 2015-06-23 MED ORDER — SODIUM CHLORIDE 0.9 % IV SOLN
10.0000 mmol | Freq: Once | INTRAVENOUS | Status: AC
  Administered 2015-06-23: 10 mmol via INTRAVENOUS
  Filled 2015-06-23: qty 3.33

## 2015-06-23 MED ORDER — FAMOTIDINE IN NACL 20-0.9 MG/50ML-% IV SOLN
20.0000 mg | INTRAVENOUS | Status: DC
  Administered 2015-06-23 – 2015-06-27 (×5): 20 mg via INTRAVENOUS
  Filled 2015-06-23 (×6): qty 50

## 2015-06-23 MED ORDER — FAT EMULSION 20 % IV EMUL
240.0000 mL | INTRAVENOUS | Status: AC
  Administered 2015-06-23: 240 mL via INTRAVENOUS
  Filled 2015-06-23: qty 240

## 2015-06-23 MED ORDER — SODIUM CHLORIDE 0.9 % IV SOLN
25.0000 ug/h | INTRAVENOUS | Status: DC
  Administered 2015-06-23 (×2): 50 ug/h via INTRAVENOUS
  Filled 2015-06-23 (×3): qty 50

## 2015-06-23 MED ORDER — INSULIN ASPART 100 UNIT/ML ~~LOC~~ SOLN: 0.0000 [IU] | Freq: Three times a day (TID) | SUBCUTANEOUS | Status: DC

## 2015-06-23 MED ORDER — PANTOPRAZOLE SODIUM 40 MG IV SOLR: 40.0000 mg | INTRAVENOUS | Status: DC

## 2015-06-23 MED ORDER — LEVOFLOXACIN 750 MG PO TABS
750.0000 mg | ORAL_TABLET | Freq: Every day | ORAL | Status: DC
  Administered 2015-06-23: 750 mg via ORAL
  Filled 2015-06-23: qty 1

## 2015-06-23 MED ORDER — MIDAZOLAM HCL 2 MG/2ML IJ SOLN
1.0000 mg | INTRAMUSCULAR | Status: DC | PRN
Start: 2015-06-23 — End: 2015-06-25
  Administered 2015-06-23: 2 mg via INTRAVENOUS
  Administered 2015-06-24: 1 mg via INTRAVENOUS
  Filled 2015-06-23 (×2): qty 2

## 2015-06-23 MED ORDER — FENTANYL CITRATE (PF) 100 MCG/2ML IJ SOLN: 50.0000 ug | Freq: Once | INTRAMUSCULAR | Status: DC

## 2015-06-23 MED ORDER — CHLORHEXIDINE GLUCONATE 0.12% ORAL RINSE (MEDLINE KIT)
15.0000 mL | Freq: Two times a day (BID) | OROMUCOSAL | Status: DC
  Administered 2015-06-23 – 2015-06-24 (×3): 15 mL via OROMUCOSAL

## 2015-06-23 MED ORDER — LEVALBUTEROL HCL 0.63 MG/3ML IN NEBU
0.6300 mg | INHALATION_SOLUTION | Freq: Four times a day (QID) | RESPIRATORY_TRACT | Status: DC
  Administered 2015-06-23: 0.63 mg via RESPIRATORY_TRACT
  Filled 2015-06-23: qty 3

## 2015-06-23 MED ORDER — ANTISEPTIC ORAL RINSE SOLUTION (CORINZ)
7.0000 mL | Freq: Four times a day (QID) | OROMUCOSAL | Status: DC
  Administered 2015-06-24 (×4): 7 mL via OROMUCOSAL

## 2015-06-23 MED ORDER — MIDAZOLAM HCL 2 MG/2ML IJ SOLN
1.0000 mg | INTRAMUSCULAR | Status: AC | PRN
  Administered 2015-06-23 (×3): 1 mg via INTRAVENOUS
  Filled 2015-06-23 (×2): qty 2

## 2015-06-23 MED FILL — Medication: Qty: 1 | Status: AC

## 2015-06-23 NOTE — Progress Notes (Signed)
Nutrition Follow-up  DOCUMENTATION CODES:   Not applicable  INTERVENTION:  - Continue TPN/TPN advancement per pharmacy - RD will continue to monitor for needs   NUTRITION DIAGNOSIS:   Inadequate oral intake related to inability to eat as evidenced by NPO status.  ongoing  GOAL:   Patient will meet greater than or equal to 90% of their needs  Met w/ current TPN rate.  MONITOR:   Weight trends, Labs, I & O's, Other (Comment) (TPN regimen)  REASON FOR ASSESSMENT:   Consult New TPN/TNA  ASSESSMENT:   79 yo black male who has a history of stage 4 prostate cancer, HTN, DM, tachycardia, recent GI bleed. He had an endoscopy at that time of admission but apparently deferred an inpatient colonoscopy. He just had his colonoscopy done by Dr. Watt Climes on Tuesday. By report, this was normal with no source of bleeding identified. He denies any further bloody BMs sin79 yo black male who has a history of stage 4 prostate cancer, HTN, DM, tachycardia, recent GI bleed. He had an endoscopy at that time of admission but apparently deferred an inpatient colonoscopy. He just had his colonoscopy done by Dr. Watt Climes on Tuesday. By report, this was normal with no source of bleeding identified. He denies any further bloody BMs since this admission. This morning around 6 or 7 am, the patient began having centralized crampy abdominal pain. He denies nausea or vomiting, but admits to no flatus. He deals with constipation on a regular basis. Due to persistent pain, the patient presented to [facility] for further evaluation.ce this admission. This morning around 6 or 7 am, the patient began having centralized crampy abdominal pain. He denies nausea or vomiting, but admits to no flatus. He deals with constipation on a regular basis. Due to persistent pain, the patient presented to [facility] for further evaluation.  Pt's TPN is at goal rate. Still suffering from SOB despite inhalers and IV Lasix.Pt suffering from  PNA, and fluid overload.  Pt also suffering from UTI induced sepsis.  11 days s/p SB resection. Pt currently tolerating soft diet. No intake information in chart, pt says he is eating a little better. Per MD, plan to wean off TPN.  Labs: K 3.3, Ca 6.5, Albumin 1.5, CBGs 193-195 Medications reviewed  Diet Order:  TPN (CLINIMIX-E) Adult DIET SOFT Room service appropriate?: Yes; Fluid consistency:: Thin  Skin:  Wound (see comment) (Abdominal incision)  Last BM:  PTA  Height:   Ht Readings from Last 1 Encounters:  06/11/15 6' (1.829 m)    Weight:   Wt Readings from Last 1 Encounters:  06/23/15 232 lb 5.8 oz (105.4 kg)    Ideal Body Weight:  80.91 kg (kg)  BMI:  Body mass index is 31.51 kg/(m^2).  Estimated Nutritional Needs:   Kcal:  1410-3013  Protein:  115-130 grams  Fluid:  2.2 L/day  EDUCATION NEEDS:   No education needs identified at this time  Satira Anis. Mirren Gest, MS, RD LDN After Hours/Weekend Pager [phone]

## 2015-06-23 NOTE — Progress Notes (Signed)
  Dr. Harlow Asa and I were present during arrest.  Appreciate everyone's great care.  Pt now awake on vent. VSS. Labs okay. Family updated.  A/P: Acute respiratory failure-on vent, appreciate CCM management. Solumedrol started.  POD#11 exlap with SBR for strangulated bowel and internal hernia-OGT to [facility], TPN.  Doubt episode was driver from an intra-abdominal process.  Will reassess in AM.   ID-on Vanc an zosyn for sepsis, PNA DM II-CBGs, SSI Stage 4 prostate cancer Hypokalemia-repleted, repeat k 4.6 VTE prophylaxis-SCD/heparin.  Dopplers 12/9 negative for DVT   Keajah Killough, ANP-BC

## 2015-06-23 NOTE — Progress Notes (Signed)
Patient ID: Jeremy Jennings, male   DOB: [birth year], 79 y.o.   MRN: [id number] 11 Days Post-Op  Subjective: Pt c/o issues with his breathing, having increase WOB this morning and overnight.  He has gotten 3 breathing treatments so far and continues to wheeze.  New CXR reveals PNA, but he is also fluid positive.    Tolerates full liquids yesterday.  No nausea.   Objective: Vital signs in last 24 hours: Temp:  [97.5 F (36.4 C)-98.6 F (37 C)] 97.8 F (36.6 C) (12/13 0400) Pulse Rate:  [81-105] 82 (12/13 0400) Resp:  [22-25] 24 (12/13 0400) BP: (121-154)/(47-64) 154/47 mmHg (12/13 0400) SpO2:  [98 %-100 %] 99 % (12/13 0400) Weight:  [105.4 kg (232 lb 5.8 oz)] 105.4 kg (232 lb 5.8 oz) (12/13 0600) Last BM Date: 06/05/15  Intake/Output from previous day: 12/12 0701 - 12/13 0700 In: 1508.3 [IV Piggyback:555; TPN:953.3] Out: 1350 [Urine:1350] Intake/Output this shift:    PE: Abd: soft, mild bloating, but has great BS, incision is c/d/i with staples present Heart: regular Lungs: Diffuse wheezing and accessory muscle usage  Lab Results:   Recent Labs  06/21/15 0450 06/22/15 0515  WBC 10.0 10.2  HGB 7.6* 8.1*  HCT 25.1* 24.7*  PLT 184 217   BMET  Recent Labs  06/22/15 0515 06/23/15 0450  NA 135 134*  K 3.1* 3.3*  CL 108 107  CO2 20* 20*  GLUCOSE 150* 203*  BUN 20 24*  CREATININE 0.86 0.93  CALCIUM 6.3* 6.5*   PT/INR  Recent Labs  06/22/15 0515  LABPROT 15.8*  INR 1.24   CMP     Component Value Date/Time   NA 134* 06/23/2015 0450   NA 142 05/18/2015 0838   K 3.3* 06/23/2015 0450   K 3.5 05/18/2015 0838   CL 107 06/23/2015 0450   CO2 20* 06/23/2015 0450   CO2 18* 05/18/2015 0838   GLUCOSE 203* 06/23/2015 0450   GLUCOSE 84 05/18/2015 0838   BUN 24* 06/23/2015 0450   BUN 18.3 05/18/2015 0838   CREATININE 0.93 06/23/2015 0450   CREATININE 0.8 05/18/2015 0838   CALCIUM 6.5* 06/23/2015 0450   CALCIUM 8.4 05/18/2015 0838   PROT 4.8* 06/22/2015 0515   PROT 6.5 05/18/2015 0838   ALBUMIN 1.5* 06/22/2015 0515   ALBUMIN 2.4* 05/18/2015 0838   AST 46* 06/22/2015 0515   AST 18 05/18/2015 0838   ALT 21 06/22/2015 0515   ALT <9 05/18/2015 0838   ALKPHOS 327* 06/22/2015 0515   ALKPHOS 488* 05/18/2015 0838   BILITOT 0.5 06/22/2015 0515   BILITOT 0.33 05/18/2015 0838   GFRNONAA >60 06/23/2015 0450   GFRAA >60 06/23/2015 0450   Lipase     Component Value Date/Time   LIPASE 46 11/09/2014 1137       Studies/Results: Dg Chest Port 1 View  06/23/2015  CLINICAL DATA:  Acute onset of shortness of breath and decreased O2 saturation. Initial encounter. EXAM: PORTABLE CHEST 1 VIEW COMPARISON:  Chest radiograph and CT of the chest performed 06/19/2015 FINDINGS: The lungs are mildly hypoexpanded. Left basilar airspace opacity raises concern for pneumonia. There is no evidence of pleural effusion or pneumothorax. The cardiomediastinal silhouette is borderline normal in size. No acute osseous abnormalities are seen. IMPRESSION: Lungs mildly hypoexpanded. Left basilar airspace opacity is concerning for pneumonia. Electronically Signed   By: Garald Balding M.D.   On: 06/23/2015 05:17    Anti-infectives: Anti-infectives    Start     Dose/Rate Route Frequency Ordered  Stop   06/23/15 1000  levofloxacin (LEVAQUIN) tablet 750 mg     750 mg Oral Daily 06/23/15 0750     06/22/15 1600  vancomycin (VANCOCIN) IVPB 1000 mg/200 mL premix  Status:  Discontinued     1,000 mg 200 mL/hr over 60 Minutes Intravenous Every 12 hours 06/22/15 1346 06/23/15 0740   06/20/15 0200  vancomycin (VANCOCIN) 1,250 mg in sodium chloride 0.9 % 250 mL IVPB  Status:  Discontinued     1,250 mg 166.7 mL/hr over 90 Minutes Intravenous Every 12 hours 06/19/15 1314 06/22/15 1346   06/19/15 1400  vancomycin (VANCOCIN) 2,000 mg in sodium chloride 0.9 % 500 mL IVPB     2,000 mg 250 mL/hr over 120 Minutes Intravenous  Once 06/19/15 1313 06/19/15 1737   06/19/15 1315   piperacillin-tazobactam (ZOSYN) IVPB 3.375 g  Status:  Discontinued     3.375 g 12.5 mL/hr over 240 Minutes Intravenous Every 8 hours 06/19/15 1305 06/23/15 0740   06/19/15 1100  cefTRIAXone (ROCEPHIN) 2 g in dextrose 5 % 50 mL IVPB  Status:  Discontinued     2 g 100 mL/hr over 30 Minutes Intravenous Every 24 hours 06/19/15 1020 06/19/15 1246   06/12/15 0930  cefoTEtan (CEFOTAN) 2 g in dextrose 5 % 50 mL IVPB     2 g 100 mL/hr over 30 Minutes Intravenous On call to O.R. 06/12/15 0852 06/12/15 1359       Assessment/Plan  Small bowel obstruction with internal hernia S/p exploratory laparotomy with SB resection from strangulated SBO, 06/12/15, Dr. [name], POD 11             -needs to mobilize and pulm toilet  Sepsis, Due to UTI, pneumonia - improving on Zosyn/Vanco             SOB, likely due to PNA and fluid overload.  Will d/w medicine about more                lasix today as this will likely help Post op ileus- resolving.  Soft diet, wean TNA to off Tachycardia - On Coreg and Diltiazem - will resume per cardiology AODM - Insulin dependant Prostate cancer ongoing treatment Hypertension             Hypokalemia, replace K, check BMET in am VTE proph: SCD/heparin    LOS: 12 days    Macrina Lehnert E 06/23/2015, 8:38 AM Pager: [phone]

## 2015-06-23 NOTE — Progress Notes (Signed)
Date: June 23, 2015 Chart reviewed for concurrent status and case management needs. Will continue to follow patient for changes and needs:  Cardiac arrested and intubated Velva Harman, RN, BSN, [place]   [phone]

## 2015-06-23 NOTE — Progress Notes (Addendum)
PARENTERAL NUTRITION CONSULT NOTE - Follow Up  Pharmacy Consult for TPN Indication: Prolonged ileus  No Known Allergies  Patient Measurements: Height: 6' (182.9 cm) Weight: 232 lb 5.8 oz (105.4 kg) IBW/kg (Calculated) : 77.6 Adjusted Body Weight: 82.4 kg Usual Weight:  95.3 kg  Vital Signs: Temp: 97.8 F (36.6 C) (12/13 0400) Temp Source: Oral (12/12 2029) BP: 154/47 mmHg (12/13 0400) Pulse Rate: 82 (12/13 0400) Intake/Output from previous day: 12/12 0701 - 12/13 0700 In: 1508.3 [IV Piggyback:555; TPN:953.3] Out: 1350 [Urine:1350] Intake/Output from this shift:    Labs:  Recent Labs  06/20/15 2020 06/21/15 0450 06/22/15 0515  WBC 9.8 10.0 10.2  HGB 7.7* 7.6* 8.1*  HCT 26.6* 25.1* 24.7*  PLT 196 184 217  INR  --   --  1.24    Recent Labs  06/21/15 0900 06/22/15 0515 06/23/15 0450  NA PATIENT IDENTIFICATION ERROR. PLEASE DISREGARD RESULTS. ACCOUNT WILL BE CREDITED. 135 134*  K PATIENT IDENTIFICATION ERROR. PLEASE DISREGARD RESULTS. ACCOUNT WILL BE CREDITED. 3.1* 3.3*  CL PATIENT IDENTIFICATION ERROR. PLEASE DISREGARD RESULTS. ACCOUNT WILL BE CREDITED. 108 107  CO2 PATIENT IDENTIFICATION ERROR. PLEASE DISREGARD RESULTS. ACCOUNT WILL BE CREDITED. 20* 20*  GLUCOSE PATIENT IDENTIFICATION ERROR. PLEASE DISREGARD RESULTS. ACCOUNT WILL BE CREDITED. 150* 203*  BUN PATIENT IDENTIFICATION ERROR. PLEASE DISREGARD RESULTS. ACCOUNT WILL BE CREDITED. 20 24*  CREATININE PATIENT IDENTIFICATION ERROR. PLEASE DISREGARD RESULTS. ACCOUNT WILL BE CREDITED. 0.86 0.93  CALCIUM PATIENT IDENTIFICATION ERROR. PLEASE DISREGARD RESULTS. ACCOUNT WILL BE CREDITED. 6.3* 6.5*  MG  --  1.9 1.9  PHOS  --  2.0* 2.5  PROT  --  4.8*  --   ALBUMIN  --  1.5*  --   AST  --  46*  --   ALT  --  21  --   ALKPHOS  --  327*  --   BILITOT  --  0.5  --   PREALBUMIN  --  2.4*  --   TRIG  --  149  --    Estimated Creatinine Clearance: 83.5 mL/min (by C-G formula based on Cr of 0.93).    Recent  Labs  06/22/15 2028 06/22/15 2323 06/23/15 0407  GLUCAP 146* 199* 187*   Medications:  Scheduled:  . acetaminophen  1,000 mg Oral TID  . bisacodyl  10 mg Rectal Daily  . carvedilol  12.5 mg Oral BID WC  . diltiazem  360 mg Oral Daily  . heparin subcutaneous  5,000 Units Subcutaneous 3 times per day  . insulin aspart  0-15 Units Subcutaneous 6 times per day  . insulin glargine  10 Units Subcutaneous Daily  . levalbuterol  0.63 mg Nebulization Q6H  . levofloxacin  750 mg Oral Daily  . lip balm  1 application Topical BID  . magnesium sulfate 1 - 4 g bolus IVPB  1 g Intravenous Once  . nitrofurantoin (macrocrystal-monohydrate)  100 mg Oral Q12H  . pantoprazole (PROTONIX) IV  40 mg Intravenous QHS  . potassium chloride  10 mEq Intravenous Q1 Hr x 4  . potassium phosphate IVPB (mmol)  10 mmol Intravenous Once  . prednisoLONE acetate  1 drop Both Eyes BID  . saccharomyces boulardii  250 mg Oral BID   Infusions:  . Marland KitchenTPN (CLINIMIX-E) Adult 100 mL/hr at 06/22/15 1811   And  . fat emulsion 240 mL (06/22/15 1811)   Insulin Requirements in past 24 hours:  16 unit Novolog SSI yesterday Lantus 10 units q24 30 units in TPN  Current Nutrition:  full liquids started 12/11 afternoon  IVF: none  Central access: PICC placed 12/6 TPN start date: 12/6  ASSESSMENT                                                                                                          HPI: 79 year old male with widely metastatic prostate cancer who presented 12/1 with abdominal pain nausea and vomiting. CT scan has shown evidence of small bowel obstruction with possibly a closed loop obstruction. Follow-up x-ray showed no improvement in small bowel obstruction and underwent emergency laparotomy with small bowel resection on 12/2.  Has now developed SBO with internal hernia and has been managed conservatively. Today, 12/6, CCS has consulted pharmacy to begin TPN for prolonged ileus.  Significant events:  12/10:  transferred to ICU 12/11: significant increase in SCr, CBG's remain elevated 12/13: start soft diet, orders to wean TPN to off **afternoon update**pt intubated, consulted to continue TPN  Today: 06/23/2015  Glucose: range 114-203   Novolog/Lantus  Current regimen - Lantus 10 units q24, Novolog SSI moderate scale q4hr, 30 Units in TPN  Electrolytes:   Ca low  = 6.5, corrects to 8.5 with albumin 1.5 - had required supplementation.   Na low, K low  Mg and Phos low  Renal: SCr 0.93  LFTs: AlkPhos elevated, others wnl (12/12)  TGs: 149  Prealbumin: low at 3.5 (12/6),4.4 (12/7), 2.4 (12/12)   NUTRITIONAL GOALS                                                                                             RD recs (12/2): Kcal: 2300-2500, Protein: 115-130 grams, Fluid: 2.2 L/day Clinimix E 5/15 at a goal rate of 151ml/hr + 20% fat emulsion at 46ml/hr to provide: 120g/day protein, 2184Kcal/day.  PLAN   K Phos 85mmol x 1 KCl IV 59meq  x4 Mag Sulfate 1gm IV x 1                                                                                                                       At 1800 today:  Continue Clinimix E 5/15 at goal rate 174ml/hr  20% fat emulsion at 35ml/hr.  TPN  to contain standard multivitamins and trace elements.  continue Lantus 10 units once daily  Add 30 Units insulin to TPN,  Continue moderate SSI q4h  BMet, mag and phos in am  TPN lab panels on Mondays & Thursdays.  F/u daily.  Dolly Rias RPh 06/23/2015, 8:09 AM Pager [phone]

## 2015-06-23 NOTE — Progress Notes (Addendum)
   06/23/15 1300  Clinical Encounter Type  Visited With Family;Patient  Visit Type Code;Critical Care;Psychological support;Spiritual support  Referral From Other (Comment) (code blue)  Consult/Referral To Chaplain  Spiritual Encounters  Spiritual Needs Emotional  Stress Factors  Family Stress Factors Health changes   Chaplains [place] and Jerene Pitch responded to code blue.    Provided support with pt's daughter, [name], at bedside during code event.  Continued emotional support following stabilization.  [name] providing comfort with pt stating, "They are taking care of you"  "I need you to fight and push through"  Will continue to follow and assess for emotional and spiritual needs.     [place], [place]

## 2015-06-23 NOTE — Progress Notes (Signed)
Pharmacy Antibiotic Follow-up Note  Jeremy Jennings is a 79 y.o. year-old male admitted on 06/11/2015.  The patient is currently on day 5 of vancomycin and zosyn for pneumonia.  These were de-escalated earlier today to [place] for enterococcus UTI, however patient is now s/p respiratory arrest and intubation. New CXR concerning for LL PNA.  Pharmacy asked to re-dose [place] for HCAP.   Assessment/Plan:  -Resume vancomycin to 1gm IV q12h  -Resume Zosyn 3.375g IV Q8H infused over 4hrs.  F/U patient clinical course, renal function, micro data  Duration of therapy per MD   Temp (24hrs), Avg:97.8 F (36.6 C), Min:96.6 F (35.9 C), Max:98.6 F (37 C)   Recent Labs Lab 06/20/15 0600 06/20/15 2020 06/21/15 0450 06/22/15 0515 06/23/15 1311  WBC 10.3 9.8 10.0 10.2 10.9*     Recent Labs Lab 06/20/15 0600 06/21/15 0900 06/22/15 0515 06/23/15 0450 06/23/15 1311  CREATININE 0.90 PATIENT IDENTIFICATION ERROR. PLEASE DISREGARD RESULTS. ACCOUNT WILL BE CREDITED. 0.86 0.93 1.08   Estimated Creatinine Clearance: 71.9 mL/min (by C-G formula based on Cr of 1.08).    No Known Allergies  Antimicrobials this admission: 12/9 >> vanc >>  12/9 >> ZEI >>  Levels/dose changes this admission: 12/12 1300 VT 23  Microbiology results: 12/8 blood x2: NGTD  12/9 urine: Enterococcus, pan sens  MRSA PCR neg  Thank you for allowing pharmacy to be a part of this patient's care.  Netta Cedars, PharmD, BCPS Pager: [phone] 06/23/2015, 2:16 PM

## 2015-06-23 NOTE — Progress Notes (Addendum)
Shift event note:  Notified by RN at approx 2230 regarding pt w/ increased WOB and bil expiratory wheezes. RR in the mid 30's and 02 sats 98% on r/a. At that time HR still > 100. Record reviewed.  Pt given Xopenex neb and Lasix 40 mg IV. Pt noted to have diuresed approx 400 cc and rested well but w/ persistent mild exp wheezes. Notified again at approx 0450 regarding increased WOB w/ use of accessory muscles and audible wheezes. 02 sats remained 98-100% on r/a. 02 applied at 2L via [place].  At bedside pt noted lying quietly in bed in no acute respiratory distress. He is oriented to self and seems appropriate. He denies pain, h/o asthma or COPD. He smoked < a pack a day in his youth but quit 35 years ago. However, RR 26-30 w/ increased use of accessory muscles and inspiratory/expiratory wheezes. Pt no longer tachycardic and remaining VSS. Pt given repeat Xopenex neb and stat PCXR obtained. Pt subjectively states he feels better after neb though expiratory wheezes and mild tachypnea persist. 02 sats have remained normal. PCXR is w/o acute changes from previous CXR on 06/19/15.  Assessment/Plan: 1. Persistent tachypnea/wheezes: In setting of HCAP. Worse w/ exertion. Continue IV Vanc/Zosyn. A continuous neb considered but pt appeared to be resting well so will hold for now. Will increase frequency of nebs as they are currently only BID. Pt did improve some w/ one-time dose of Lasix.  It is felt pt is third spacing d/t hypoalbuminemia so volume likely a component. No additional lasix for now. Will add BNP to am labs. Will continue to monitor closely in SDU.   Jeryl [place], NP-C [facility] Pager [phone]

## 2015-06-23 NOTE — Progress Notes (Signed)
Pharmacy Antibiotic Follow-up Note  Jeremy Jennings is a 79 y.o. year-old male admitted on 06/11/2015.  The patient is currently on day 5 of vancomycin and zosyn for pneumonia.  Assessment/Plan: -d/c vanc and zosyn -start levaquin 750mg  po q12h -macrobid 100mg  po bid (MD)  Temp (24hrs), Avg:98.1 F (36.7 C), Min:97.5 F (36.4 C), Max:98.6 F (37 C)   Recent Labs Lab 06/19/15 0517 06/20/15 0600 06/20/15 2020 06/21/15 0450 06/22/15 0515  WBC 9.3 10.3 9.8 10.0 10.2     Recent Labs Lab 06/19/15 0517 06/20/15 0600 06/21/15 0900 06/22/15 0515 06/23/15 0450  CREATININE 0.92 0.90 PATIENT IDENTIFICATION ERROR. PLEASE DISREGARD RESULTS. ACCOUNT WILL BE CREDITED. 0.86 0.93   Estimated Creatinine Clearance: 83.5 mL/min (by C-G formula based on Cr of 0.93).    No Known Allergies  Antimicrobials this admission: 12/9 >> vanc >> 12/13 12/9 >> ZEI >> 12/13   Levels/dose changes this admission: 12/12 1300 VT 23  Microbiology results: 12/8 blood x2: NGTD  12/9 urine: Enterococcus, pan sens  MRSA PCR neg  Thank you for allowing pharmacy to be a part of this patient's care.  Dolly Rias RPh 06/23/2015, 8:05 AM Pager [phone]

## 2015-06-23 NOTE — Anesthesia Procedure Notes (Addendum)
Procedure Name: Intubation Date/Time: 06/23/2015 12:55 PM Performed by: Lollie Sails Oxygen Delivery Method: Ambu bag Preoxygenation: Pre-oxygenation with 100% oxygen Laryngoscope size: Glidescope #4. Grade View: Grade I Tube type: Subglottic suction tube Tube size: 8.0 mm Number of attempts: 1 Airway Equipment and Method: Video-laryngoscopy Placement Confirmation: ETT inserted through vocal cords under direct vision,  CO2 detector and breath sounds checked- equal and bilateral Secured at: 22 cm Tube secured with: Tape Dental Injury: Teeth and Oropharynx as per pre-operative assessment  Comments: DL attempted by CRNA x 2 - patient breathing and biting on blade.    Succinycholine given by MDA and glidescope by ER MD for intubation.

## 2015-06-23 NOTE — Progress Notes (Signed)
[facility] Progress Note    Patient: Jeremy Jennings    [id number]  DOB: Jul 08, 1936     DOA: 06/11/2015 Date of Service: the patient was seen and examined on 06/23/2015  Subjective: No acute complaint, tolerating oral diet. Nutrition: Tolerating full liquids Activity: Getting out of bed to chair Last BM: Before surgery, passing gas  Assessment and Plan: 1. healthcare associated pneumonia, enterococcus UTI 2. 2.sinus tachycardia, hyperdynamic LV with mid cavity obliteration without LVOT  3. . Acute on chronic diastolic dysfunction Volume overload  Blood culture remain negative For 3 days, urine culture positive for enterococcus-pansensitive,  Overnight patient's respiratory distress worsened further with increased wheezing and tachypnea. ABG was not showing any acute CO2 retention, chest x-ray was showing persistent left-sided infiltrate, BNP was 147. Nebulizers were changed to albuterol as well as ipratropium every 4 hours. Broad-spectrum antibiotics were continued overnight. Since patient did not respond to 80 mg of IV Lasix yesterday on was to give him albumin followed by 1 mg of Bumex. This was discussed with cardiology as well. With persistent increased work of breathing despite normal ABG critical care was consulted.  Before critical care follow-up with the patient the patient had a respiratory arrest with PEA. Patient required CPR as well as intubation. Following the code the patient was alert with improvement vitals. Patient's medical management will be as per critical care.  DVT Prophylaxis: subcutaneous Heparin Nutrition: TPN  Advance goals of care discussion: Full code  Antibiotics: Anti-infectives    Start     Dose/Rate Route Frequency Ordered Stop   06/23/15 1700  vancomycin (VANCOCIN) IVPB 1000 mg/200 mL premix     1,000 mg 200 mL/hr over 60 Minutes Intravenous Every 12 hours 06/23/15 1414     06/23/15 1430  piperacillin-tazobactam (ZOSYN) IVPB 3.375 g      3.375 g 12.5 mL/hr over 240 Minutes Intravenous Every 8 hours 06/23/15 1416     06/23/15 1415  piperacillin-tazo (ZOSYN) NICU IV syringe 200 mg/mL  Status:  Discontinued     75 mg/kg  105.4 kg 79 mL/hr over 30 Minutes Intravenous Every 8 hours 06/23/15 1401 06/23/15 1416   06/23/15 1000  levofloxacin (LEVAQUIN) tablet 750 mg  Status:  Discontinued     750 mg Oral Daily 06/23/15 0750 06/23/15 1401   06/22/15 1600  vancomycin (VANCOCIN) IVPB 1000 mg/200 mL premix  Status:  Discontinued     1,000 mg 200 mL/hr over 60 Minutes Intravenous Every 12 hours 06/22/15 1346 06/23/15 0740   06/20/15 0200  vancomycin (VANCOCIN) 1,250 mg in sodium chloride 0.9 % 250 mL IVPB  Status:  Discontinued     1,250 mg 166.7 mL/hr over 90 Minutes Intravenous Every 12 hours 06/19/15 1314 06/22/15 1346   06/19/15 1400  vancomycin (VANCOCIN) 2,000 mg in sodium chloride 0.9 % 500 mL IVPB     2,000 mg 250 mL/hr over 120 Minutes Intravenous  Once 06/19/15 1313 06/19/15 1737   06/19/15 1315  piperacillin-tazobactam (ZOSYN) IVPB 3.375 g  Status:  Discontinued     3.375 g 12.5 mL/hr over 240 Minutes Intravenous Every 8 hours 06/19/15 1305 06/23/15 0740   06/19/15 1100  cefTRIAXone (ROCEPHIN) 2 g in dextrose 5 % 50 mL IVPB  Status:  Discontinued     2 g 100 mL/hr over 30 Minutes Intravenous Every 24 hours 06/19/15 1020 06/19/15 1246   06/12/15 0930  cefoTEtan (CEFOTAN) 2 g in dextrose 5 % 50 mL IVPB     2 g 100 mL/hr over  30 Minutes Intravenous On call to O.R. 06/12/15 0852 06/12/15 1359      Family Communication: family was present at bedside, at the time of interview.  Opportunity was given to ask question and all questions were answered satisfactorily.   Disposition:  We will continue to follow the patient peripherally.    Intake/Output Summary (Last 24 hours) at 06/23/15 1707 Last data filed at 06/23/15 1204  Gross per 24 hour  Intake 2661.51 ml  Output   1050 ml  Net 1611.51 ml   Filed Weights    06/21/15 0500 06/22/15 0500 06/23/15 0600  Weight: 100.9 kg (222 lb 7.1 oz) 104.8 kg (231 lb 0.7 oz) 105.4 kg (232 lb 5.8 oz)    Objective: Physical Exam: Filed Vitals:   06/23/15 1515 06/23/15 1611 06/23/15 1624 06/23/15 1700  BP: 109/42 110/46    Pulse: 63 64 80   Temp:    96 F (35.6 C)  TempSrc:      Resp: 22 20 20    Height:  6' (1.829 m)    Weight:      SpO2: 100% 99% 100%    General: Appear in moderate distress, no Rash; Oral Mucosa moist. Cardiovascular: S1 and S2 Present, no Murmur Respiratory: Bilateral Air entry present and left basal Crackles, upper airway and expiratory wheezes Abdomen: Bowel Sound sluggish, Soft and no tenderness Extremities: Bilateral Pedal edema improving, no calf tenderness  Data Reviewed: CBC:  Recent Labs Lab 06/17/15 0425 06/19/15 0517 06/20/15 0600 06/20/15 2020 06/21/15 0450 06/22/15 0515 06/23/15 1311  WBC 5.4 9.3 10.3 9.8 10.0 10.2 10.9*  NEUTROABS 3.8 7.0  --   --   --  8.4*  --   HGB 8.6* 8.6* 7.0* 7.7* 7.6* 8.1* 8.0*  HCT 26.3* 28.3* 21.5* 26.6* 25.1* 24.7* 25.5*  MCV 89.5 93.7 89.2 105.1* 99.6 89.8 91.7  PLT 319 266 219 196 184 217 [id number]   Basic Metabolic Panel:  Recent Labs Lab 06/18/15 0330 06/19/15 0517 06/20/15 0600 06/21/15 0900 06/22/15 0515 06/23/15 0450 06/23/15 1311  NA 143 140 138 PATIENT IDENTIFICATION ERROR. PLEASE DISREGARD RESULTS. ACCOUNT WILL BE CREDITED. 135 134* 137  K 3.7 4.0 3.6 PATIENT IDENTIFICATION ERROR. PLEASE DISREGARD RESULTS. ACCOUNT WILL BE CREDITED. 3.1* 3.3* 4.6  CL 116* 113* 111 PATIENT IDENTIFICATION ERROR. PLEASE DISREGARD RESULTS. ACCOUNT WILL BE CREDITED. 108 107 109  CO2 23 17* 21* PATIENT IDENTIFICATION ERROR. PLEASE DISREGARD RESULTS. ACCOUNT WILL BE CREDITED. 20* 20* 21*  GLUCOSE 182* 187* 263* PATIENT IDENTIFICATION ERROR. PLEASE DISREGARD RESULTS. ACCOUNT WILL BE CREDITED. 150* 203* 226*  BUN 10 14 17  PATIENT IDENTIFICATION ERROR. PLEASE DISREGARD RESULTS. ACCOUNT WILL BE  CREDITED. 20 24* 25*  CREATININE 0.72 0.92 0.90 PATIENT IDENTIFICATION ERROR. PLEASE DISREGARD RESULTS. ACCOUNT WILL BE CREDITED. 0.86 0.93 1.08  CALCIUM 6.6* 6.5* 6.1* PATIENT IDENTIFICATION ERROR. PLEASE DISREGARD RESULTS. ACCOUNT WILL BE CREDITED. 6.3* 6.5* 6.7*  MG 1.8 1.8 1.8  --  1.9 1.9  --   PHOS 1.9* 2.5 2.6  --  2.0* 2.5  --    Liver Function Tests:  Recent Labs Lab 06/17/15 0425 06/18/15 0330 06/22/15 0515  AST 17 31 46*  ALT 11* 11* 21  ALKPHOS 267* 346* 327*  BILITOT 0.5 0.8 0.5  PROT 5.2* 5.0* 4.8*  ALBUMIN 1.7* 1.7* 1.5*   No results for input(s): LIPASE, AMYLASE in the last 168 hours. No results for input(s): AMMONIA in the last 168 hours.  Cardiac Enzymes:  Recent Labs Lab 06/19/15 1120 06/23/15 1311  TROPONINI 0.04* <0.03   BNP (last 3 results)  Recent Labs  06/19/15 1120 06/23/15 0530  BNP 258.8* 147.8*   CBG:  Recent Labs Lab 06/23/15 0407 06/23/15 0759 06/23/15 1155 06/23/15 1306 06/23/15 1701  GLUCAP 187* 195* 193* 176* 240*    Recent Results (from the past 240 hour(s))  Culture, blood (routine x 2)     Status: None   Collection Time: 06/18/15  9:11 PM  Result Value Ref Range Status   Specimen Description BLOOD LEFT ASSIST CONTROL  Final   Special Requests BOTTLES DRAWN AEROBIC ONLY 5ML  Final   Culture   Final    NO GROWTH 5 DAYS Performed at [facility]    Report Status 06/23/2015 FINAL  Final  Culture, blood (routine x 2)     Status: None   Collection Time: 06/18/15  9:15 PM  Result Value Ref Range Status   Specimen Description BLOOD LEFT HAND  Final   Special Requests BOTTLES DRAWN AEROBIC ONLY 5ML  Final   Culture   Final    NO GROWTH 5 DAYS Performed at [facility]    Report Status 06/23/2015 FINAL  Final  Culture, Urine     Status: None   Collection Time: 06/19/15 10:59 AM  Result Value Ref Range Status   Specimen Description URINE, CLEAN CATCH  Final   Special Requests NONE  Final   Culture    Final    >=100,000 COLONIES/mL ENTEROCOCCUS SPECIES Performed at [facility]    Report Status 06/21/2015 FINAL  Final   Organism ID, Bacteria ENTEROCOCCUS SPECIES  Final      Susceptibility   Enterococcus species - MIC*    AMPICILLIN <=2 SENSITIVE Sensitive     LEVOFLOXACIN 1 SENSITIVE Sensitive     NITROFURANTOIN <=16 SENSITIVE Sensitive     VANCOMYCIN 1 SENSITIVE Sensitive     * >=100,000 COLONIES/mL ENTEROCOCCUS SPECIES     Studies: Dg Chest Port 1 View  06/23/2015  CLINICAL DATA:  ET tube repositioning EXAM: PORTABLE CHEST 1 VIEW COMPARISON:  06/23/2015 FINDINGS: Endotracheal tube has been retracted and is approximately 2 cm above the carina. Right PICC line is unchanged as is the NG tube. There is mild cardiomegaly. Left lower lobe atelectasis or infiltrate noted. Patchy right upper lobe opacity also noted. These are unchanged since prior study. IMPRESSION: Interval retraction of the endotracheal tube, now approximately 2 cm above the carina. Stable patchy right upper lobe and left lower lobe airspace opacities. Electronically Signed   By: Rolm Baptise M.D.   On: 06/23/2015 16:29   Dg Chest Port 1 View  06/23/2015  CLINICAL DATA:  Status post endotracheal intubation EXAM: PORTABLE CHEST - 1 VIEW COMPARISON:  06/23/2015 FINDINGS: Cardiac shadow is stable. Some mild atelectatic changes remain in the left lung base and right mid lung. Nasogastric catheter is been placed but is coiled at the level of the diaphragm in may lie within a hiatal hernia. A right-sided PICC line is noted at the cavoatrial junction. An endotracheal tube is seen at the level of the carina directing slightly into the right mainstem bronchus. This should be withdrawn approximately 4 cm. No other focal abnormality is noted. IMPRESSION: Tubes and lines as described above. The endotracheal tube should be withdrawn 4 cm. Bilateral patchy atelectatic changes. These results were called by telephone at the time of  interpretation on 06/23/2015 at 2:14 pm to [facility], the patients nurse Who verbally acknowledged these results. Electronically Signed  By: Inez Catalina M.D.   On: 06/23/2015 14:19   Dg Chest Port 1 View  06/23/2015  CLINICAL DATA:  Acute onset of shortness of breath and decreased O2 saturation. Initial encounter. EXAM: PORTABLE CHEST 1 VIEW COMPARISON:  Chest radiograph and CT of the chest performed 06/19/2015 FINDINGS: The lungs are mildly hypoexpanded. Left basilar airspace opacity raises concern for pneumonia. There is no evidence of pleural effusion or pneumothorax. The cardiomediastinal silhouette is borderline normal in size. No acute osseous abnormalities are seen. IMPRESSION: Lungs mildly hypoexpanded. Left basilar airspace opacity is concerning for pneumonia. Electronically Signed   By: Garald Balding M.D.   On: 06/23/2015 05:17     Scheduled Meds: . acetaminophen  1,000 mg Oral TID  . [START ON 06/24/2015] antiseptic oral rinse  7 mL Mouth Rinse QID  . chlorhexidine gluconate  15 mL Mouth Rinse BID  . [START ON 06/24/2015] diltiazem  60 mg Oral 4 times per day  . famotidine (PEPCID) IV  20 mg Intravenous Q24H  . fentaNYL (SUBLIMAZE) injection  100 mcg Intravenous Once  . fentaNYL (SUBLIMAZE) injection  50 mcg Intravenous Once  . heparin subcutaneous  5,000 Units Subcutaneous 3 times per day  . insulin aspart  0-15 Units Subcutaneous 6 times per day  . insulin glargine  10 Units Subcutaneous Daily  . ipratropium-albuterol  3 mL Nebulization Q4H  . lip balm  1 application Topical BID  . methylPREDNISolone (SOLU-MEDROL) injection  60 mg Intravenous Q12H  . metoprolol tartrate  12.5 mg Oral BID  . midazolam  2 mg Intravenous Once  . pantoprazole (PROTONIX) IV  40 mg Intravenous QHS  . piperacillin-tazobactam (ZOSYN)  IV  3.375 g Intravenous Q8H  . potassium chloride  40 mEq Oral BID  . potassium phosphate IVPB (mmol)  10 mmol Intravenous Once  . prednisoLONE acetate  1 drop Both Eyes  BID  . saccharomyces boulardii  250 mg Oral BID  . vancomycin  1,000 mg Intravenous Q12H   Continuous Infusions: . Marland KitchenTPN (CLINIMIX-E) Adult 100 mL/hr at 06/22/15 1811   And  . fat emulsion 240 mL (06/22/15 1811)  . Marland KitchenTPN (CLINIMIX-E) Adult     And  . fat emulsion    . fentaNYL infusion INTRAVENOUS 200 mcg/hr (06/23/15 1600)  . norepinephrine (LEVOPHED) Adult infusion 8 mcg/min (06/23/15 1650)   PRN Meds: bisacodyl, diphenhydrAMINE **OR** diphenhydrAMINE, fentaNYL, magic mouthwash, methocarbamol (ROBAXIN)  IV, midazolam, morphine injection, ondansetron **OR** ondansetron (ZOFRAN) IV, sodium chloride  Time spent: 30 minutes  Author: Berle Mull, MD [facility] Pager: [phone] 06/23/2015 5:07 PM  If 7PM-7AM, please contact night-coverage at [email or web address], password [facility]

## 2015-06-23 NOTE — Progress Notes (Signed)
Subjective:  SOB this am- no improvement with inhalers or IV Lasix  Objective:  Vital Signs in the last 24 hours: Temp:  [97.5 F (36.4 C)-98.6 F (37 C)] 97.9 F (36.6 C) (12/13 0821) Pulse Rate:  [81-105] 90 (12/13 0800) Resp:  [22-26] 26 (12/13 0800) BP: (121-158)/(41-64) 158/41 mmHg (12/13 0800) SpO2:  [98 %-100 %] 100 % (12/13 0800) Weight:  [232 lb 5.8 oz (105.4 kg)] 232 lb 5.8 oz (105.4 kg) (12/13 0600)  Intake/Output from previous day:  Intake/Output Summary (Last 24 hours) at 06/23/15 1139 Last data filed at 06/23/15 1111  Gross per 24 hour  Intake 3754.85 ml  Output   1850 ml  Net 1904.85 ml    Physical Exam: General appearance: cooperative, moderate distress, moderately obese and dyspneic Lungs: decreased breath sounds, upper airway wheezing Heart: regular rate and rhythm Skin: cool, moist   Rate: 78  Rhythm: normal sinus rhythm and premature atrial contractions (PAC)  Lab Results:  Recent Labs  06/21/15 0450 06/22/15 0515  WBC 10.0 10.2  HGB 7.6* 8.1*  PLT 184 217    Recent Labs  06/22/15 0515 06/23/15 0450  NA 135 134*  K 3.1* 3.3*  CL 108 107  CO2 20* 20*  GLUCOSE 150* 203*  BUN 20 24*  CREATININE 0.86 0.93   No results for input(s): TROPONINI in the last 72 hours.  Invalid input(s): CK, MB  Recent Labs  06/22/15 0515  INR 1.24    Scheduled Meds: . acetaminophen  1,000 mg Oral TID  . albumin human  12.5 g Intravenous Once  . bisacodyl  10 mg Rectal Daily  . bumetanide  1 mg Intravenous Once  . carvedilol  12.5 mg Oral BID WC  . diltiazem  360 mg Oral Daily  . famotidine (PEPCID) IV  20 mg Intravenous Q24H  . guaiFENesin  600 mg Oral BID  . heparin subcutaneous  5,000 Units Subcutaneous 3 times per day  . insulin aspart  0-15 Units Subcutaneous TID AC & HS  . insulin glargine  10 Units Subcutaneous Daily  . ipratropium-albuterol  3 mL Nebulization Q4H  . levofloxacin  750 mg Oral Daily  . lip balm  1 application  Topical BID  . nitrofurantoin (macrocrystal-monohydrate)  100 mg Oral Q12H  . pantoprazole (PROTONIX) IV  40 mg Intravenous QHS  . potassium chloride  40 mEq Oral BID  . potassium phosphate IVPB (mmol)  10 mmol Intravenous Once  . prednisoLONE acetate  1 drop Both Eyes BID  . saccharomyces boulardii  250 mg Oral BID   Continuous Infusions: . Marland KitchenTPN (CLINIMIX-E) Adult 100 mL/hr at 06/22/15 1811   And  . fat emulsion 240 mL (06/22/15 1811)   PRN Meds:.alum & mag hydroxide-simeth, bisacodyl, diphenhydrAMINE **OR** diphenhydrAMINE, magic mouthwash, menthol-cetylpyridinium, methocarbamol (ROBAXIN)  IV, morphine injection, ondansetron **OR** ondansetron (ZOFRAN) IV, phenol, sodium chloride   Imaging: Dg Chest Port 1 View  06/23/2015  CLINICAL DATA:  Acute onset of shortness of breath and decreased O2 saturation. Initial encounter. EXAM: PORTABLE CHEST 1 VIEW COMPARISON:  Chest radiograph and CT of the chest performed 06/19/2015 FINDINGS: The lungs are mildly hypoexpanded. Left basilar airspace opacity raises concern for pneumonia. There is no evidence of pleural effusion or pneumothorax. The cardiomediastinal silhouette is borderline normal in size. No acute osseous abnormalities are seen. IMPRESSION: Lungs mildly hypoexpanded. Left basilar airspace opacity is concerning for pneumonia. Electronically Signed   By: Garald Balding M.D.   On: 06/23/2015 05:17  Cardiac Studies: echo 06/19/15 Study Conclusions  - Left ventricle: The cavity size was normal. Wall thickness was increased in a pattern of moderate LVH. There is mid-LV cavity systolic obliteration with increased mid-cavitary gradient. The estimated ejection fraction was 75%. The study is not technically sufficient to allow evaluation of LV diastolic function due to E/A fusion. - Left atrium: The atrium was at the upper limits of normal in size. - Tricuspid valve: There was mild regurgitation. - Pulmonary arteries: PA peak  pressure: 34 mm Hg (S) + RAP. - Systemic veins: Not visualized.  Impressions:  - LVEF [id number], mid-systolic LV cavitary obliteration, moderate LVH, upper normal LA size, mild TR, RVSP 34 mmHg + RAP (IVC not visualized).  Assessment/Plan:  79 y.o.AA male, followed by Dr Mare Ferrari,  with past medical history of HTN, HCVD, and DM. Echo showed an EF of 75% with cavity obliteration. He was admitted with SBO, s/p surgery 06/12/15. Post op he had an ileus. We were consulted for sinus tachycardia which has improved . He has had increasing SOB the last 24 hrs. There is concern he has diastolic CHF.   Principal Problem:   SBO (small bowel obstruction) ischemic s/p LOA & SB resection 06/12/2015 Active Problems:   HCAP (healthcare-associated pneumonia)   Dyspnea-diastolic CHF   Hypertension   Sinus tachycardia (HCC)   Metabolic acidosis   Type 2 diabetes mellitus (HCC)   Hypertensive cardiovascular disease-EF 75%, LVH   Hyperlipidemia   Prostate cancer (HCC)   Anemia   Enterococcus UTI   PLAN: Lasix being changed to Bumex. Will follow closely change Coreg to Lopressor and decrease dose.   Kerin Ransom PA-C 06/23/2015, 11:39 AM [phone]  Patient examined chart reviewed Discussed respiratory arrest issues with Dr Harlow Asa.  Patient had bradycardia secondary to hypoxic arrest. No primary arrhythmic event.  Currently intubated with bloody secretions. CXR with  RLL pneumonia BNP was only 147  Echo previously with hyperdynamic LV function. Post intubation CXR pending Rhythm now stable with oxygenation NSR rates [id number] and systolic BP 123456 mmHg.  Abdomen has staples intact With mild distension.  NG tube passed during code with 500 cc of bilious material.  Plan per CCM/Pulmonary and General Surgery.    Jenkins Rouge

## 2015-06-23 NOTE — Progress Notes (Signed)
Central Telemetry alerted me of the patient brady down into the 30s at 1237. Upon arrival to the room,  the CODE button had been activated by a team member. The patient was resuscitated and is in critical condition.

## 2015-06-23 NOTE — ED Provider Notes (Signed)
Jeremy Jennings  Department of Emergency Medicine   Code Blue CONSULT NOTE  Chief Complaint: Cardiac arrest/unresponsive   Level V Caveat: Unresponsive  History of present illness: I was contacted by the hospital for a CODE BLUE cardiac arrest upstairs and presented to the patient's bedside.  Patient is a surgical patient was admitted for small bowel obstruction and is status post abdominal surgery.  Nursing staff reports that he had increasing work of breathing to the morning despite IV diuretics he continued to worsen.  Pulmonary critical care was contacted and just prior to their official consultation the patient developed a bradycardic arrest.  On my arrival to the patient's bedside he is receiving CPR and bag mask ventilation.  He is pulseless and apneic  ROS: Unable to obtain, Level V caveat  Scheduled Meds: . acetaminophen  1,000 mg Oral TID  . bisacodyl  10 mg Rectal Daily  . diltiazem  360 mg Oral Daily  . famotidine (PEPCID) IV  20 mg Intravenous Q24H  . fentaNYL (SUBLIMAZE) injection  50 mcg Intravenous Once  . guaiFENesin  600 mg Oral BID  . heparin subcutaneous  5,000 Units Subcutaneous 3 times per day  . insulin aspart  0-15 Units Subcutaneous TID AC & HS  . insulin glargine  10 Units Subcutaneous Daily  . ipratropium-albuterol  3 mL Nebulization Q4H  . levofloxacin  750 mg Oral Daily  . lip balm  1 application Topical BID  . metoprolol tartrate  12.5 mg Oral BID  . nitrofurantoin (macrocrystal-monohydrate)  100 mg Oral Q12H  . pantoprazole (PROTONIX) IV  40 mg Intravenous QHS  . potassium chloride  40 mEq Oral BID  . potassium phosphate IVPB (mmol)  10 mmol Intravenous Once  . prednisoLONE acetate  1 drop Both Eyes BID  . saccharomyces boulardii  250 mg Oral BID   Continuous Infusions: . Marland KitchenTPN (CLINIMIX-E) Adult 100 mL/hr at 06/22/15 1811   And  . fat emulsion 240 mL (06/22/15 1811)  . fentaNYL infusion INTRAVENOUS    . norepinephrine (LEVOPHED) Adult  infusion 3 mcg/min (06/23/15 1324)   PRN Meds:.alum & mag hydroxide-simeth, bisacodyl, diphenhydrAMINE **OR** diphenhydrAMINE, fentaNYL, magic mouthwash, menthol-cetylpyridinium, methocarbamol (ROBAXIN)  IV, midazolam, midazolam, morphine injection, ondansetron **OR** ondansetron (ZOFRAN) IV, phenol, sodium chloride Past Medical History  Diagnosis Date  . Essential hypertension   . Type 2 diabetes mellitus ([name])   . Hyperlipidemia   . Prostate cancer ([name])   . History of GI bleed   . Tachycardia   . PAC (premature atrial contraction)     Frequent   Past Surgical History  Procedure Laterality Date  . Tonsillectomy    . Hemorroidectomy  1969  . Esophagogastroduodenoscopy N/A 05/28/2015    Procedure: ESOPHAGOGASTRODUODENOSCOPY (EGD);  Surgeon: [name], MD;  Location: [name];  Service: Endoscopy;  Laterality: N/A;  . Inguinal hernia repair Bilateral AFE 16-17  . Hernia repair  1981    DR [name]  . Laparotomy N/A 06/12/2015    Procedure: EXPLORATORY LAPAROTOMY;  Surgeon: [name], MD;  Location: [facility];  Service: General;  Laterality: N/A;  . Bowel resection N/A 06/12/2015    Procedure: SMALL BOWEL RESECTION;  Surgeon: [name], MD;  Location: [facility];  Service: General;  Laterality: N/A;   Social History   Social History  . Marital Status: Widowed    Spouse Name: N/A  . Number of Children: N/A  . Years of Education: N/A   Occupational History  . Not on file.  Social History Main Topics  . Smoking status: Former Smoker -- 0.25 packs/day for 20 years    Types: Cigarettes    Quit date: 07/12/1979  . Smokeless tobacco: Never Used  . Alcohol Use: No  . Drug Use: No  . Sexual Activity: Not Currently   Other Topics Concern  . Not on file   Social History Narrative   No Known Allergies  Last set of Vital Signs (not current) Filed Vitals:   06/23/15 1200 06/23/15 1314  BP: 113/44 76/37  Pulse: 64 66  Temp: 96.6 F (35.9 C)   Resp: 16 22       Physical Exam  Gen: unresponsive Cardiovascular: pulseless  Resp: apneic. Breath sounds equal bilaterally with bagging  Abd: distended with healing surgical midline incision with staples in place.  Neuro: GCS 3, unresponsive to pain  HEENT: No blood in posterior pharynx, gag reflex absent  Neck: No crepitus  Musculoskeletal: No deformity  Skin: warm  Procedures  INTUBATION Performed by: [name] Required items: required blood products, implants, devices, and special equipment available Patient identity confirmed: provided demographic data and hospital-assigned identification number Time out: Immediately prior to procedure a "time out" was called to verify the correct patient, procedure, equipment, support staff and site/side marked as required. Indications: respiratory failure Intubation method: glidescope Preoxygenation: BVM Sedatives: none Paralytic: Succinylcholine  Tube Size: 8-0 cuffed Post-procedure assessment: chest rise and ETCO2 monitor Breath sounds: equal and absent over the epigastrium Tube secured by Respiratory Therapy Patient tolerated the procedure well with no immediate complications.   Cardiopulmonary Resuscitation (CPR) Procedure Note Directed/Performed by: [name] I personally directed ancillary staff and/or performed CPR in an effort to regain return of spontaneous circulation and to maintain cardiac, neuro and systemic perfusion.     Assessment and Plan  Intubated at the bedside.  Likely primary hypoxic arrest.  Ongoing care management per pulmonary critical care medicine.  General surgery team obtaining    [name], MD 06/23/15 1335

## 2015-06-23 NOTE — Procedures (Signed)
Central Venous Catheter Insertion Procedure Note Jeremy Jennings [id number] 05-Aug-1935  Procedure: Insertion of Central Venous Catheter Indications: Assessment of intravascular volume, Drug and/or fluid administration and Frequent blood sampling  Procedure Details Consent: Risks of procedure as well as the alternatives and risks of each were explained to the (patient/caregiver).  Consent for procedure obtained. Time Out: Verified patient identification, verified procedure, site/side was marked, verified correct patient position, special equipment/implants available, medications/allergies/relevent history reviewed, required imaging and test results available.  Performed  Maximum sterile technique was used including antiseptics, cap, gloves, gown, hand hygiene, mask and sheet. Skin prep: Chlorhexidine; local anesthetic administered A antimicrobial bonded/coated triple lumen catheter was placed in the left internal jugular vein using the Seldinger technique.  Evaluation Blood flow good Complications: No apparent complications Patient did tolerate procedure well. Chest X-ray ordered to verify placement.  CXR: pending.  Jeremy Jennings 06/23/2015, 4:56 PM  [place]  [name] Mould, MD, [place] Pgr: [facility]

## 2015-06-23 NOTE — Progress Notes (Signed)
eLink Physician-Brief Progress Note Patient Name: CLIFORD DABISH DOB: [birth year] MRN: [id number]   Date of Service  06/23/2015  HPI/Events of Note  Review of CXR for R subclavian CVL placement reveals that the CVL tip is in the distal SVC. No pneumothorax.   eICU Interventions  OK to use R subclavian CVL for fluids and medications.      Intervention Category Intermediate Interventions: Diagnostic test evaluation  Simuel Stebner Eugene 06/23/2015, 5:27 PM

## 2015-06-23 NOTE — Consult Note (Signed)
PULMONARY / CRITICAL CARE MEDICINE   Name: Jeremy Jennings MRN: [id number] DOB: 06-29-1936    ADMISSION DATE:  06/11/2015 CONSULTATION DATE:  06/23/15  REFERRING MD :  CCS  CHIEF COMPLAINT:  Acute respiratory failure, cardiac arrest  INITIAL PRESENTATION: Admitted on 06/11/15 with abdominal pain. CT scan concerning for small bowel obstruction. Underwent ex-lap and small bowel resection on 06/12/15. Postoperative course notable for sinus tachycardia. Increasing respiratory distress on 12/13. Intubated and had a brief code.  STUDIES:  LE Dopplers 12/9 >> negative for DVT CT scan chest 12/9>> negative for PE, bibasilar pleural effusions and atelectasis Echo 12/9 >>LVEF 25%, mid-systolic LV cavitary obliteration, moderate LVH, upper normal LA size, mild TR, RVSP 34 mmHg + RAP (IVC not visualized). Chest x-ray 12/13 >> left basilar opacity, concerning for pneumonia.  SIGNIFICANT EVENTS: 12/2 >> exploratory laparoscopy, small bowel resection 12/13 >> PEA arrest, intubated  HISTORY OF PRESENT ILLNESS:   Jeremy Jennings is a 79 year old with history of diabetes mellitus, prostate cancer, diastolic heart failure. He is admitted on 12/1 with abdominal pain. He was found to have a small bowel obstruction. He was taken to the OR and underwent exploratory laparoscopy and small bowel resection on 06/12/15. Postop course complicated by ileus, enterococcus UTI. He was persistently in sinus tachycardia. He was evaluated by cardiology and diuresed without any change. He also was ruled out for PE with a CTA and lower extremity Dopplers were negative for DVT on 12/9. He continued to improve slowly.  On 12/13 it was noted that he was more dyspneic, wheezing, increased work of breathing. ABG was done which showed 7.4/33/92/96%. He subsequently underwent a PEA arrest. Time to ROSC was 6 minutes. He received 2 amps of Epi. Intubated for respiratory protection.  PAST MEDICAL HISTORY :   has a past medical history of  Essential hypertension; Type 2 diabetes mellitus ([place]); Hyperlipidemia; Prostate cancer ([place]); History of GI bleed; Tachycardia; and PAC (premature atrial contraction).  has past surgical history that includes Tonsillectomy; Hemorroidectomy (1969); Esophagogastroduodenoscopy (N/A, 05/28/2015); Inguinal hernia repair (Bilateral, AFE 16-17); Hernia repair (1981); laparotomy (N/A, 06/12/2015); and Bowel resection (N/A, 06/12/2015). Prior to Admission medications   Medication Sig Start Date End Date Taking? Authorizing Provider  abiraterone Acetate (ZYTIGA) 250 MG tablet Take 4 tablets (1,000 mg total) by mouth daily. Take on an empty stomach 1 hour before or 2 hours after a meal 05/22/15  Yes Wyatt Portela, MD  ACCU-CHEK SOFTCLIX LANCETS lancets Use as instructed to check blood sugar 2 times per day dx code E11.65 10/23/14  Yes Elayne Snare, MD  acetaminophen (TYLENOL) 650 MG CR tablet Take 1 tablet (650 mg total) by mouth every 8 (eight) hours as needed for pain. 05/30/15  Yes Florencia Reasons, MD  atorvastatin (LIPITOR) 10 MG tablet take 1 tablet by mouth once daily 07/09/14  Yes Darlin Coco, MD  Blood Glucose Monitoring Suppl (ACCU-CHEK AVIVA PLUS) W/DEVICE KIT Use to check blood sugar 2 times per day dx code E11.65 10/23/14  Yes Elayne Snare, MD  carvedilol (COREG) 12.5 MG tablet Take 1 tablet (12.5 mg total) by mouth 2 (two) times daily with a meal. 08/19/14  Yes Darlin Coco, MD  Cholecalciferol (VITAMIN D PO) Take 5,000 Units by mouth daily. Taking 5000 daily   Yes Historical Provider, MD  COMBIGAN 0.2-0.5 % ophthalmic solution Place 1 drop into the right eye daily.  04/09/13  Yes Historical Provider, MD  Cyanocobalamin (VITAMIN B-12 PO) Take 1 tablet by mouth daily.   Yes  Historical Provider, MD  diltiazem (TIAZAC) 360 MG 24 hr capsule Take 360 mg by mouth daily.     Yes Historical Provider, MD  docusate sodium (COLACE) 100 MG capsule Take 100 mg by mouth daily.    Yes Historical Provider, MD  glucose  blood (ACCU-CHEK AVIVA PLUS) test strip Use as instructed to check blood sugar 2 times per day dx code E11.65 10/23/14  Yes Elayne Snare, MD  guaiFENesin (ROBITUSSIN) 100 MG/5ML liquid Take 200 mg by mouth 3 (three) times daily as needed for cough.   Yes Historical Provider, MD  insulin aspart (NOVOLOG FLEXPEN) 100 UNIT/ML FlexPen Inject 12 units 3 times per day with meals 12/05/14  Yes Elayne Snare, MD  Insulin Detemir (LEVEMIR) 100 UNIT/ML Pen Inject 12 Units into the skin daily at 10 pm.   Yes Historical Provider, MD  Insulin Pen Needle (B-D UF III MINI PEN NEEDLES) 31G X 5 MM MISC 1 each by Does not apply route 2 (two) times daily. 04/16/14  Yes Elayne Snare, MD  Liraglutide 18 MG/3ML SOPN Inject 1.8 mg into the skin daily. 06/09/14  Yes Elayne Snare, MD  megestrol (MEGACE) 400 MG/10ML suspension Take 10 mLs (400 mg total) by mouth 2 (two) times daily. 05/18/15  Yes Wyatt Portela, MD  oxyCODONE-acetaminophen (PERCOCET/ROXICET) 5-325 MG tablet Take 1 tablet by mouth every 4 (four) hours as needed for moderate pain or severe pain. 05/30/15  Yes Florencia Reasons, MD  pioglitazone (ACTOS) 30 MG tablet Take 1 tablet (30 mg total) by mouth daily. 01/19/15  Yes Elayne Snare, MD  prednisoLONE acetate (PRED FORTE) 1 % ophthalmic suspension Place 1 drop into both eyes 2 (two) times daily.  04/25/13  Yes Historical Provider, MD  tamsulosin (FLOMAX) 0.4 MG CAPS capsule Take 0.4 mg by mouth daily.  08/02/13  Yes Historical Provider, MD   No Known Allergies  FAMILY HISTORY:  indicated that his mother is deceased. He indicated that his father is deceased.  SOCIAL HISTORY:  reports that he quit smoking about 35 years ago. His smoking use included Cigarettes. He has a 5 pack-year smoking history. He has never used smokeless tobacco. He reports that he does not drink alcohol or use illicit drugs.  REVIEW OF SYSTEMS:  Unable to obtain as patient is intubated  SUBJECTIVE:   VITAL SIGNS: Temp:  [96.6 F (35.9 C)-98.6 F (37 C)]  96.6 F (35.9 C) (12/13 1200) Pulse Rate:  [64-90] 66 (12/13 1314) Resp:  [16-26] 22 (12/13 1314) BP: (76-158)/(37-57) 76/37 mmHg (12/13 1314) SpO2:  [97 %-100 %] 97 % (12/13 1200) FiO2 (%):  [100 %] 100 % (12/13 1314) Weight:  [232 lb 5.8 oz (105.4 kg)] 232 lb 5.8 oz (105.4 kg) (12/13 0600) HEMODYNAMICS:   VENTILATOR SETTINGS: Vent Mode:  [-] PRVC FiO2 (%):  [100 %] 100 % Set Rate:  [20 bmp] 20 bmp Vt Set:  [885 mL] 620 mL PEEP:  [5 cmH20] 5 cmH20 Plateau Pressure:  [24 cmH20] 24 cmH20 INTAKE / OUTPUT:  Intake/Output Summary (Last 24 hours) at 06/23/15 1338 Last data filed at 06/23/15 1204  Gross per 24 hour  Intake 2871.51 ml  Output   2000 ml  Net 871.51 ml    PHYSICAL EXAMINATION: General:  Sedated, unresponsive Neuro:  PERRLA HEENT:  Moist mucous membranes Cardiovascular:  Regular rate and rhythm, no MRG Lungs:  No wheeze, crackles.. Reduced breath sounds in the left base Abdomen:  Distended, soft, nontender. Diminished bowel sounds, abdominal incision clean and dry. Skin:  Intact  LABS:  CBC  Recent Labs Lab 06/21/15 0450 06/22/15 0515 06/23/15 1311  WBC 10.0 10.2 10.9*  HGB 7.6* 8.1* 8.0*  HCT 25.1* 24.7* 25.5*  PLT 184 217 250   Coag's  Recent Labs Lab 06/22/15 0515  INR 1.24   BMET  Recent Labs Lab 06/21/15 0900 06/22/15 0515 06/23/15 0450  NA PATIENT IDENTIFICATION ERROR. PLEASE DISREGARD RESULTS. ACCOUNT WILL BE CREDITED. 135 134*  K PATIENT IDENTIFICATION ERROR. PLEASE DISREGARD RESULTS. ACCOUNT WILL BE CREDITED. 3.1* 3.3*  CL PATIENT IDENTIFICATION ERROR. PLEASE DISREGARD RESULTS. ACCOUNT WILL BE CREDITED. 108 107  CO2 PATIENT IDENTIFICATION ERROR. PLEASE DISREGARD RESULTS. ACCOUNT WILL BE CREDITED. 20* 20*  BUN PATIENT IDENTIFICATION ERROR. PLEASE DISREGARD RESULTS. ACCOUNT WILL BE CREDITED. 20 24*  CREATININE PATIENT IDENTIFICATION ERROR. PLEASE DISREGARD RESULTS. ACCOUNT WILL BE CREDITED. 0.86 0.93  GLUCOSE PATIENT IDENTIFICATION  ERROR. PLEASE DISREGARD RESULTS. ACCOUNT WILL BE CREDITED. 150* 203*   Electrolytes  Recent Labs Lab 06/20/15 0600 06/21/15 0900 06/22/15 0515 06/23/15 0450  CALCIUM 6.1* PATIENT IDENTIFICATION ERROR. PLEASE DISREGARD RESULTS. ACCOUNT WILL BE CREDITED. 6.3* 6.5*  MG 1.8  --  1.9 1.9  PHOS 2.6  --  2.0* 2.5   Sepsis Markers  Recent Labs Lab 06/19/15 1120 06/19/15 1430  LATICACIDVEN 1.1 1.1   ABG  Recent Labs Lab 06/23/15 1033  PHART 7.379  PCO2ART 33.0*  PO2ART 91.6   Liver Enzymes  Recent Labs Lab 06/17/15 0425 06/18/15 0330 06/22/15 0515  AST 17 31 46*  ALT 11* 11* 21  ALKPHOS 267* 346* 327*  BILITOT 0.5 0.8 0.5  ALBUMIN 1.7* 1.7* 1.5*   Cardiac Enzymes  Recent Labs Lab 06/19/15 1120  TROPONINI 0.04*   Glucose  Recent Labs Lab 06/22/15 1658 06/22/15 2028 06/22/15 2323 06/23/15 0407 06/23/15 0759 06/23/15 1155  GLUCAP 190* 146* 199* 187* 195* 193*    Imaging Dg Chest Port 1 View  06/23/2015  CLINICAL DATA:  Acute onset of shortness of breath and decreased O2 saturation. Initial encounter. EXAM: PORTABLE CHEST 1 VIEW COMPARISON:  Chest radiograph and CT of the chest performed 06/19/2015 FINDINGS: The lungs are mildly hypoexpanded. Left basilar airspace opacity raises concern for pneumonia. There is no evidence of pleural effusion or pneumothorax. The cardiomediastinal silhouette is borderline normal in size. No acute osseous abnormalities are seen. IMPRESSION: Lungs mildly hypoexpanded. Left basilar airspace opacity is concerning for pneumonia. Electronically Signed   By: Garald Balding M.D.   On: 06/23/2015 05:17     ASSESSMENT / PLAN:  79 year old metastatic prostate cancer. Status post exploratory laparoscopy with bowel resection for small bowel obstruction. Intubated for acute respiratory failure with wheezing and increased work of breathing. Brief PEA arrest  Reason for his decompensation is not clear. He was wheezing before his  intubation and may have an element of COPD although his smoking history is minimal. He does have infiltrates on chest x-ray suggestive of HCAP . He may have mucous plug leading up to his respiratory arrest. PE, DVT was ruled out on 12/19. We may have to reevaluate once he is more stable.  PULMONARY OETT 12/13 >> A: Acute respiratory failure HCAP ? COPD exacerbation ? PE P:   8 mL/kg tidal volume ventilation. Check ABG, chest x-ray postintubation Broaden antibiotics to Vanco and Zosyn Consider repeat CT of the chest. Duo nebs every 4 Start Solu-Medrol 60 every 12 hrs   CARDIOVASCULAR CVL A:  Sinus tachycardia Diastolic heart failure P:  Off Lasix now Continue Cardizem and Lopressor as  per cardiology  RENAL A:   Stable P:   Monitor BUN/creatinine, urine output Potassium repleted  GASTROINTESTINAL A:   Small bowel obstruction with hernia Status post exploratory laparoscopy, small bowel resection Postop ileus P:   Weaning off TPN. Keep nothing by mouth for now Will start tube feeds tomorrow if he continues to be stable Protonix for his SUP  HEMATOLOGIC A: Stable P:   INFECTIOUS A:  HCAP Enterococcus UTI. P:   BCx2 >> UC 12/9 >> Enterococcus Sputum >> Abx: Chrystine Oiler, start date 12/13,  Zosyn, start date 12/13,   Antibiotics broadened to Vanco, Zosyn today. Follow sputum cultures, ICU procalcitonin protocol  ENDOCRINE A:  Diabetes mellitus P:   Continue Lantus Change aspart to SSI as he is NPO for now  NEUROLOGIC A:    P:   RASS goal: 0 Fentanyl drip and Versed when necessary  FAMILY  - Updates:  - Inter-disciplinary family meet or Palliative Care meeting due by: 12/20.  Critical care time-35 minutes.  TODAY'S SUMMARY:   Marshell Garfinkel MD [facility] Pager [phone] If no answer or after 3pm call: [phone] 06/23/2015, 1:38 PM

## 2015-06-24 ENCOUNTER — Inpatient Hospital Stay (HOSPITAL_COMMUNITY): Payer: Medicare Other

## 2015-06-24 LAB — GLUCOSE, CAPILLARY
GLUCOSE-CAPILLARY: 341 mg/dL — AB (ref 65–99)
GLUCOSE-CAPILLARY: 271 mg/dL — AB (ref 65–99)
Glucose-Capillary: 286 mg/dL — ABNORMAL HIGH (ref 65–99)
Glucose-Capillary: 288 mg/dL — ABNORMAL HIGH (ref 65–99)
Glucose-Capillary: 355 mg/dL — ABNORMAL HIGH (ref 65–99)

## 2015-06-24 LAB — DIFFERENTIAL
BASOS ABS: 0 10*3/uL (ref 0.0–0.1)
BASOS PCT: 0 %
EOS ABS: 0.1 10*3/uL (ref 0.0–0.7)
EOS PCT: 1 %
LYMPHS ABS: 1 10*3/uL (ref 0.7–4.0)
Lymphocytes Relative: 8 %
MONO ABS: 0.8 10*3/uL (ref 0.1–1.0)
Monocytes Relative: 6 %
Neutro Abs: 10.6 10*3/uL — ABNORMAL HIGH (ref 1.7–7.7)
Neutrophils Relative %: 85 %

## 2015-06-24 LAB — PHOSPHORUS: Phosphorus: 2.7 mg/dL (ref 2.5–4.6)

## 2015-06-24 LAB — CBC
HCT: 23 % — ABNORMAL LOW (ref 39.0–52.0)
Hemoglobin: 7.5 g/dL — ABNORMAL LOW (ref 13.0–17.0)
MCH: 29 pg (ref 26.0–34.0)
MCHC: 32.6 g/dL (ref 30.0–36.0)
MCV: 88.8 fL (ref 78.0–100.0)
PLATELETS: 224 10*3/uL (ref 150–400)
RBC: 2.59 MIL/uL — ABNORMAL LOW (ref 4.22–5.81)
RDW: 16.4 % — AB (ref 11.5–15.5)
WBC: 12.5 10*3/uL — AB (ref 4.0–10.5)

## 2015-06-24 LAB — COMPREHENSIVE METABOLIC PANEL
ALT: 20 U/L (ref 17–63)
ANION GAP: 8 (ref 5–15)
AST: 61 U/L — AB (ref 15–41)
Albumin: 1.6 g/dL — ABNORMAL LOW (ref 3.5–5.0)
Alkaline Phosphatase: 655 U/L — ABNORMAL HIGH (ref 38–126)
BILIRUBIN TOTAL: 0.5 mg/dL (ref 0.3–1.2)
BUN: 32 mg/dL — AB (ref 6–20)
CHLORIDE: 108 mmol/L (ref 101–111)
CO2: 20 mmol/L — ABNORMAL LOW (ref 22–32)
Calcium: 6.7 mg/dL — ABNORMAL LOW (ref 8.9–10.3)
Creatinine, Ser: 1.03 mg/dL (ref 0.61–1.24)
Glucose, Bld: 321 mg/dL — ABNORMAL HIGH (ref 65–99)
POTASSIUM: 4.3 mmol/L (ref 3.5–5.1)
Sodium: 136 mmol/L (ref 135–145)
TOTAL PROTEIN: 5.4 g/dL — AB (ref 6.5–8.1)

## 2015-06-24 LAB — TYPE AND SCREEN
ABO/RH(D): B POS
ANTIBODY SCREEN: NEGATIVE
UNIT DIVISION: 0
Unit division: 0

## 2015-06-24 LAB — TRIGLYCERIDES: TRIGLYCERIDES: 178 mg/dL — AB (ref <150)

## 2015-06-24 LAB — PROCALCITONIN: PROCALCITONIN: 1.75 ng/mL

## 2015-06-24 LAB — MAGNESIUM: MAGNESIUM: 2.3 mg/dL (ref 1.7–2.4)

## 2015-06-24 LAB — PREALBUMIN: PREALBUMIN: 4.8 mg/dL — AB (ref 18–38)

## 2015-06-24 MED ORDER — INSULIN ASPART 100 UNIT/ML ~~LOC~~ SOLN
0.0000 [IU] | SUBCUTANEOUS | Status: DC
  Administered 2015-06-24 (×2): 11 [IU] via SUBCUTANEOUS
  Administered 2015-06-24: 15 [IU] via SUBCUTANEOUS
  Administered 2015-06-25 (×2): 4 [IU] via SUBCUTANEOUS
  Administered 2015-06-25 (×2): 7 [IU] via SUBCUTANEOUS
  Administered 2015-06-25: 11 [IU] via SUBCUTANEOUS
  Administered 2015-06-25: 7 [IU] via SUBCUTANEOUS
  Administered 2015-06-26 (×3): 4 [IU] via SUBCUTANEOUS

## 2015-06-24 MED ORDER — INSULIN GLARGINE 100 UNIT/ML ~~LOC~~ SOLN
16.0000 [IU] | Freq: Every day | SUBCUTANEOUS | Status: DC
  Administered 2015-06-24 – 2015-06-27 (×4): 16 [IU] via SUBCUTANEOUS
  Filled 2015-06-24 (×4): qty 0.16

## 2015-06-24 MED ORDER — FUROSEMIDE 10 MG/ML IJ SOLN
40.0000 mg | Freq: Two times a day (BID) | INTRAMUSCULAR | Status: DC
  Administered 2015-06-24 – 2015-06-26 (×6): 40 mg via INTRAVENOUS
  Filled 2015-06-24 (×6): qty 4

## 2015-06-24 NOTE — Clinical Documentation Improvement (Signed)
Cardiology Internal Medicine Critical Care  Can the diagnosis of "diastolic CHF" be further specified?    Acuity - Acute, Chronic, Acute on Chronic  Other  Clinically Undetermined   Please exercise your independent, professional judgment when responding. A specific answer is not anticipated or expected.   Thank you, Mateo Flow, RN [phone] Clinical Documentation Specialist

## 2015-06-24 NOTE — Progress Notes (Signed)
Pt self-extubated at 2115. Breathing on his own and O2 sat was 100%. E-link called, RN and RRT at bedside. [facility] to ask if about re-inserting NG tube. Surgeon on call advised replacing NG tube if pt has nausea or vomiting. Pt has not reported nausea or vomiting.

## 2015-06-24 NOTE — Progress Notes (Signed)
Patient ID: Jeremy Jennings, male   DOB: [birth year], 79 y.o.   MRN: [id number]  [facility]  POD#: 12  Subjective: Patient in ICU, on vent, alert, responsive, family at bedside.  Denies abd pain.  No BM's overnight.  Objective: Vital signs in last 24 hours: Temp:  [96 F (35.6 C)-99 F (37.2 C)] 97.9 F (36.6 C) (12/14 0748) Pulse Rate:  [43-118] 76 (12/14 0700) Resp:  [10-29] 20 (12/14 0700) BP: (74-200)/(36-107) 141/107 mmHg (12/14 0700) SpO2:  [32 %-100 %] 97 % (12/14 0700) FiO2 (%):  [40 %-100 %] 40 % (12/14 0409) Weight:  [104 kg (229 lb 4.5 oz)] 104 kg (229 lb 4.5 oz) (12/14 0506) Last BM Date: 06/23/15  Intake/Output from previous day: 12/13 0701 - 12/14 0700 In: 5967.3 [I.V.:443.6; IV Piggyback:953.3; TPN:4550.3] Out: 2500 [Urine:1650; Emesis/NG output:850] Intake/Output this shift:    Physical Exam: HEENT - sclerae clear, mucous membranes moist Neck - soft Abdomen - obese, mild distension; BS present; wound dry and intact  Lab Results:   Recent Labs  06/23/15 1311 06/24/15 0400  WBC 10.9* 12.5*  HGB 8.0* 7.5*  HCT 25.5* 23.0*  PLT 250 224   BMET  Recent Labs  06/23/15 2300 06/24/15 0400  NA 134* 136  K 4.2 4.3  CL 108 108  CO2 19* 20*  GLUCOSE 309* 321*  BUN 31* 32*  CREATININE 1.04 1.03  CALCIUM 6.5* 6.7*   PT/INR  Recent Labs  06/22/15 0515 06/23/15 1311  LABPROT 15.8* 16.3*  INR 1.24 1.30   Comprehensive Metabolic Panel:    Component Value Date/Time   NA 136 06/24/2015 0400   NA 134* 06/23/2015 2300   NA 142 05/18/2015 0838   NA 138 04/22/2015 1509   K 4.3 06/24/2015 0400   K 4.2 06/23/2015 2300   K 3.5 05/18/2015 0838   K 3.5 04/22/2015 1509   CL 108 06/24/2015 0400   CL 108 06/23/2015 2300   CO2 20* 06/24/2015 0400   CO2 19* 06/23/2015 2300   CO2 18* 05/18/2015 0838   CO2 22 04/22/2015 1509   BUN 32* 06/24/2015 0400   BUN 31* 06/23/2015 2300   BUN 18.3 05/18/2015 0838   BUN 16.2  04/22/2015 1509   CREATININE 1.03 06/24/2015 0400   CREATININE 1.04 06/23/2015 2300   CREATININE 0.8 05/18/2015 0838   CREATININE 0.9 04/22/2015 1509   GLUCOSE 321* 06/24/2015 0400   GLUCOSE 309* 06/23/2015 2300   GLUCOSE 84 05/18/2015 0838   GLUCOSE 288* 04/22/2015 1509   CALCIUM 6.7* 06/24/2015 0400   CALCIUM 6.5* 06/23/2015 2300   CALCIUM 8.4 05/18/2015 0838   CALCIUM 7.9* 04/22/2015 1509   AST 61* 06/24/2015 0400   AST 46* 06/22/2015 0515   AST 18 05/18/2015 0838   AST 15 04/22/2015 1509   ALT 20 06/24/2015 0400   ALT 21 06/22/2015 0515   ALT <9 05/18/2015 0838   ALT <9 04/22/2015 1509   ALKPHOS 655* 06/24/2015 0400   ALKPHOS 327* 06/22/2015 0515   ALKPHOS 488* 05/18/2015 0838   ALKPHOS 499* 04/22/2015 1509   BILITOT 0.5 06/24/2015 0400   BILITOT 0.5 06/22/2015 0515   BILITOT 0.33 05/18/2015 0838   BILITOT 0.37 04/22/2015 1509   PROT 5.4* 06/24/2015 0400   PROT 4.8* 06/22/2015 0515   PROT 6.5 05/18/2015 0838   PROT 5.9* 04/22/2015 1509   ALBUMIN 1.6* 06/24/2015 0400   ALBUMIN 1.5* 06/22/2015 0515   ALBUMIN 2.4* 05/18/2015 [id number]  ALBUMIN 2.2* 04/22/2015 1509    Studies/Results: Dg Chest Port 1 View  06/24/2015  CLINICAL DATA:  Respiratory failure. EXAM: PORTABLE CHEST 1 VIEW COMPARISON:  06/23/2015. FINDINGS: Endotracheal tube, NG tube, left IJ line, right PICC line in stable position. Mediastinum hilar structures are stable. Cardiomegaly with diffuse bilateral pulmonary interstitial prominence consistent with congestive heart failure. Pneumonitis cannot be excluded. Persistent right mid lung and left base subsegmental atelectasis. No prominent pleural effusion. No pneumothorax. IMPRESSION: 1. Lines and tubes in stable position. 2. Persistent cardiomegaly. Interim appearance of bilateral from interstitial prominence. Findings suggest congestive heart failure. 3. Persistent low lung volumes with persistent right mid lung and left base subsegmental atelectasis.  Electronically Signed   By: [name]  Register   On: 06/24/2015 07:18   Dg Chest Port 1 View  06/23/2015  CLINICAL DATA:  Central line placement EXAM: PORTABLE CHEST 1 VIEW COMPARISON:  06/23/2015 FINDINGS: Left internal jugular central line has been placed with the tip in the upper SVC. No pneumothorax. Endotracheal tube is 3 cm above the carina. NG tube is in the stomach. Right PICC line is unchanged. Patchy right upper lobe and left lower lobe airspace opacities are again noted, unchanged. Low lung volumes. IMPRESSION: Left central line placement with the tip in the upper SVC. No pneumothorax. Otherwise no significant change. Electronically Signed   By: Rolm Baptise M.D.   On: 06/23/2015 17:15   Dg Chest Port 1 View  06/23/2015  CLINICAL DATA:  ET tube repositioning EXAM: PORTABLE CHEST 1 VIEW COMPARISON:  06/23/2015 FINDINGS: Endotracheal tube has been retracted and is approximately 2 cm above the carina. Right PICC line is unchanged as is the NG tube. There is mild cardiomegaly. Left lower lobe atelectasis or infiltrate noted. Patchy right upper lobe opacity also noted. These are unchanged since prior study. IMPRESSION: Interval retraction of the endotracheal tube, now approximately 2 cm above the carina. Stable patchy right upper lobe and left lower lobe airspace opacities. Electronically Signed   By: Rolm Baptise M.D.   On: 06/23/2015 16:29   Dg Chest Port 1 View  06/23/2015  CLINICAL DATA:  Status post endotracheal intubation EXAM: PORTABLE CHEST - 1 VIEW COMPARISON:  06/23/2015 FINDINGS: Cardiac shadow is stable. Some mild atelectatic changes remain in the left lung base and right mid lung. Nasogastric catheter is been placed but is coiled at the level of the diaphragm in may lie within a hiatal hernia. A right-sided PICC line is noted at the cavoatrial junction. An endotracheal tube is seen at the level of the carina directing slightly into the right mainstem bronchus. This should be withdrawn  approximately 4 cm. No other focal abnormality is noted. IMPRESSION: Tubes and lines as described above. The endotracheal tube should be withdrawn 4 cm. Bilateral patchy atelectatic changes. These results were called by telephone at the time of interpretation on 06/23/2015 at 2:14 pm to [facility], the patients nurse Who verbally acknowledged these results. Electronically Signed   By: Inez Catalina M.D.   On: 06/23/2015 14:19   Dg Chest Port 1 View  06/23/2015  CLINICAL DATA:  Acute onset of shortness of breath and decreased O2 saturation. Initial encounter. EXAM: PORTABLE CHEST 1 VIEW COMPARISON:  Chest radiograph and CT of the chest performed 06/19/2015 FINDINGS: The lungs are mildly hypoexpanded. Left basilar airspace opacity raises concern for pneumonia. There is no evidence of pleural effusion or pneumothorax. The cardiomediastinal silhouette is borderline normal in size. No acute osseous abnormalities are seen. IMPRESSION: Lungs mildly  hypoexpanded. Left basilar airspace opacity is concerning for pneumonia. Electronically Signed   By: Garald Balding M.D.   On: 06/23/2015 05:17    Anti-infectives: Anti-infectives    Start     Dose/Rate Route Frequency Ordered Stop   06/23/15 1700  vancomycin (VANCOCIN) IVPB 1000 mg/200 mL premix     1,000 mg 200 mL/hr over 60 Minutes Intravenous Every 12 hours 06/23/15 1414     06/23/15 1430  piperacillin-tazobactam (ZOSYN) IVPB 3.375 g     3.375 g 12.5 mL/hr over 240 Minutes Intravenous Every 8 hours 06/23/15 1416     06/23/15 1415  piperacillin-tazo (ZOSYN) NICU IV syringe 200 mg/mL  Status:  Discontinued     75 mg/kg  105.4 kg 79 mL/hr over 30 Minutes Intravenous Every 8 hours 06/23/15 1401 06/23/15 1416   06/23/15 1000  levofloxacin (LEVAQUIN) tablet 750 mg  Status:  Discontinued     750 mg Oral Daily 06/23/15 0750 06/23/15 1401   06/22/15 1600  vancomycin (VANCOCIN) IVPB 1000 mg/200 mL premix  Status:  Discontinued     1,000 mg 200 mL/hr over 60 Minutes  Intravenous Every 12 hours 06/22/15 1346 06/23/15 0740   06/20/15 0200  vancomycin (VANCOCIN) 1,250 mg in sodium chloride 0.9 % 250 mL IVPB  Status:  Discontinued     1,250 mg 166.7 mL/hr over 90 Minutes Intravenous Every 12 hours 06/19/15 1314 06/22/15 1346   06/19/15 1400  vancomycin (VANCOCIN) 2,000 mg in sodium chloride 0.9 % 500 mL IVPB     2,000 mg 250 mL/hr over 120 Minutes Intravenous  Once 06/19/15 1313 06/19/15 1737   06/19/15 1315  piperacillin-tazobactam (ZOSYN) IVPB 3.375 g  Status:  Discontinued     3.375 g 12.5 mL/hr over 240 Minutes Intravenous Every 8 hours 06/19/15 1305 06/23/15 0740   06/19/15 1100  cefTRIAXone (ROCEPHIN) 2 g in dextrose 5 % 50 mL IVPB  Status:  Discontinued     2 g 100 mL/hr over 30 Minutes Intravenous Every 24 hours 06/19/15 1020 06/19/15 1246   06/12/15 0930  cefoTEtan (CEFOTAN) 2 g in dextrose 5 % 50 mL IVPB     2 g 100 mL/hr over 30 Minutes Intravenous On call to O.R. 06/12/15 0852 06/12/15 1359      Assessment & Plans:  POD #11 - ex lap with SBR for strangulated bowel and internal hernia  OGT to LWIS  TPN - holding TF's - may start 20cc/hr if OK with CCM and weaning plans Acute respiratory failure  on vent, appreciate CCM management  Likely acute event due to mucous plugging ID  LLL pneumonia - on Vanco & Zosyn  Appreciate CCM and medical service assistance in management of this patient.  Earnstine Regal, MD, [facility] Office: [place] 06/24/2015

## 2015-06-24 NOTE — Progress Notes (Signed)
Subjective:  Intubated, awake  Objective:  Vital Signs in the last 24 hours: Temp:  [96 F (35.6 C)-99 F (37.2 C)] 97.9 F (36.6 C) (12/14 0748) Pulse Rate:  [43-118] 76 (12/14 0700) Resp:  [10-29] 20 (12/14 0700) BP: (74-200)/(36-107) 141/107 mmHg (12/14 0700) SpO2:  [32 %-100 %] 97 % (12/14 0700) FiO2 (%):  [40 %-100 %] 40 % (12/14 0848) Weight:  [229 lb 4.5 oz (104 kg)] 229 lb 4.5 oz (104 kg) (12/14 0506)  Intake/Output from previous day:  Intake/Output Summary (Last 24 hours) at 06/24/15 0853 Last data filed at 06/24/15 0700  Gross per 24 hour  Intake 3889.07 ml  Output   2500 ml  Net 1389.07 ml    Physical Exam: General appearance: awake, alert, intubated Lungs: decreased breath sounds, scattered wheezing and rhonchi Heart: regular rate and rhythm, decreased heart sounds Skin: cool, moist   Rate: 78  Rhythm: normal sinus rhythm, PVCs  Lab Results:  Recent Labs  06/23/15 1311 06/24/15 0400  WBC 10.9* 12.5*  HGB 8.0* 7.5*  PLT 250 224    Recent Labs  06/23/15 2300 06/24/15 0400  NA 134* 136  K 4.2 4.3  CL 108 108  CO2 19* 20*  GLUCOSE 309* 321*  BUN 31* 32*  CREATININE 1.04 1.03    Recent Labs  06/23/15 1311  TROPONINI <0.03    Recent Labs  06/23/15 1311  INR 1.30    Scheduled Meds: . acetaminophen  1,000 mg Oral TID  . antiseptic oral rinse  7 mL Mouth Rinse QID  . chlorhexidine gluconate  15 mL Mouth Rinse BID  . diltiazem  60 mg Oral 4 times per day  . famotidine (PEPCID) IV  20 mg Intravenous Q24H  . fentaNYL (SUBLIMAZE) injection  100 mcg Intravenous Once  . fentaNYL (SUBLIMAZE) injection  50 mcg Intravenous Once  . heparin subcutaneous  5,000 Units Subcutaneous 3 times per day  . insulin aspart  0-15 Units Subcutaneous 6 times per day  . insulin glargine  10 Units Subcutaneous Daily  . ipratropium-albuterol  3 mL Nebulization Q4H  . lip balm  1 application Topical BID  . methylPREDNISolone (SOLU-MEDROL) injection  60  mg Intravenous Q12H  . metoprolol tartrate  12.5 mg Oral BID  . midazolam  2 mg Intravenous Once  . pantoprazole (PROTONIX) IV  40 mg Intravenous QHS  . piperacillin-tazobactam (ZOSYN)  IV  3.375 g Intravenous Q8H  . potassium chloride  40 mEq Oral BID  . prednisoLONE acetate  1 drop Both Eyes BID  . saccharomyces boulardii  250 mg Oral BID  . vancomycin  1,000 mg Intravenous Q12H   Continuous Infusions: . Marland KitchenTPN (CLINIMIX-E) Adult 100 mL/hr at 06/23/15 1716   And  . fat emulsion 240 mL (06/23/15 1716)  . fentaNYL infusion INTRAVENOUS 50 mcg/hr (06/24/15 0814)  . norepinephrine (LEVOPHED) Adult infusion Stopped (06/23/15 1837)   PRN Meds:.bisacodyl, diphenhydrAMINE **OR** diphenhydrAMINE, fentaNYL, magic mouthwash, methocarbamol (ROBAXIN)  IV, midazolam, morphine injection, ondansetron **OR** ondansetron (ZOFRAN) IV, sodium chloride   Imaging: Dg Chest Port 1 View  06/24/2015  CLINICAL DATA:  Respiratory failure. EXAM: PORTABLE CHEST 1 VIEW COMPARISON:  06/23/2015. FINDINGS: Endotracheal tube, NG tube, left IJ line, right PICC line in stable position. Mediastinum hilar structures are stable. Cardiomegaly with diffuse bilateral pulmonary interstitial prominence consistent with congestive heart failure. Pneumonitis cannot be excluded. Persistent right mid lung and left base subsegmental atelectasis. No prominent pleural effusion. No pneumothorax. IMPRESSION: 1. Lines and tubes  in stable position. 2. Persistent cardiomegaly. Interim appearance of bilateral from interstitial prominence. Findings suggest congestive heart failure. 3. Persistent low lung volumes with persistent right mid lung and left base subsegmental atelectasis. Electronically Signed   By: [name]  Register   On: 06/24/2015 07:18    Dg Chest Port 1 View  06/23/2015  CLINICAL DATA:  Acute onset of shortness of breath and decreased O2 saturation. Initial encounter. EXAM: PORTABLE CHEST 1 VIEW COMPARISON:  Chest radiograph and CT  of the chest performed 06/19/2015 FINDINGS: The lungs are mildly hypoexpanded. Left basilar airspace opacity raises concern for pneumonia. There is no evidence of pleural effusion or pneumothorax. The cardiomediastinal silhouette is borderline normal in size. No acute osseous abnormalities are seen. IMPRESSION: Lungs mildly hypoexpanded. Left basilar airspace opacity is concerning for pneumonia. Electronically Signed   By: Garald Balding M.D.   On: 06/23/2015 05:17    Cardiac Studies: echo 06/19/15 Study Conclusions  - Left ventricle: The cavity size was normal. Wall thickness was increased in a pattern of moderate LVH. There is mid-LV cavity systolic obliteration with increased mid-cavitary gradient. The estimated ejection fraction was 75%. The study is not technically sufficient to allow evaluation of LV diastolic function due to E/A fusion. - Left atrium: The atrium was at the upper limits of normal in size. - Tricuspid valve: There was mild regurgitation. - Pulmonary arteries: PA peak pressure: 34 mm Hg (S) + RAP. - Systemic veins: Not visualized.  Impressions:  - LVEF [id number], mid-systolic LV cavitary obliteration, moderate LVH, upper normal LA size, mild TR, RVSP 34 mmHg + RAP (IVC not visualized).  Assessment/Plan:  79 y.o.AA male, followed by Dr Mare Ferrari,  with past medical history of HTN, HCVD, and DM. Echo showed an EF of 75% with cavity obliteration. He was admitted with SBO, s/p surgery 06/12/15. Post op he had an ileus. We were consulted for sinus tachycardia which has improved . On 06/23/15 he had increasing SOB and subsequent cardio-pulmonary arrest with bradycardia leading to asystole requiring CPR. This was felt to have been secondary to mucous plugging.   Principal Problem:   SBO (small bowel obstruction) ischemic s/p LOA & SB resection 06/12/2015 Active Problems:   HCAP (healthcare-associated pneumonia)   Dyspnea-diastolic CHF   Cardiac arrest due to  respiratory disorder (HCC)   Hypertension   Sinus tachycardia (HCC)   Metabolic acidosis   Type 2 diabetes mellitus (HCC)   Hypertensive cardiovascular disease-EF 75%, LVH   Hyperlipidemia   Prostate cancer ([place])   Anemia   Enterococcus UTI   Acute respiratory failure (HCC)   PLAN:  Will review with MD. He was not felt to have significant CHF yesterday prior to his arrest but CXR now suggests CHF, he received Bumex 1mg  yesterday prior to arrest. Check EKG this am post arrest.   Kerin Ransom PA-C 06/24/2015, 8:53 AM [phone]  Patient examined chart reviewed.  CXR with likely ARDS in setting respitory arrest and traumatic intubation. EF normal and BNP low yesterday. Ok to diurese to help lungs clear.  Troponin negative and no evidence of  Acute ischemic event.  Abdomen with staples and non acute post surgery  Jenkins Rouge

## 2015-06-24 NOTE — Progress Notes (Signed)
Initial Nutrition Assessment  DOCUMENTATION CODES:   Not applicable  INTERVENTION:  -TPN per pharmacy -Diet advancement per MD -RD will continue to monitor for needs mgmt   NUTRITION DIAGNOSIS:   Inadequate oral intake related to inability to eat as evidenced by NPO status.  ongoing  GOAL:   Patient will meet greater than or equal to 90% of their needs  Will meet with TPN @ goal rate of 100 mL/hr.  MONITOR:   Weight trends, Labs, I & O's, Other (Comment) (TPN regimen)  REASON FOR ASSESSMENT:   Consult New TPN/TNA  ASSESSMENT:   79 yo black male who has a history of stage 4 prostate cancer, HTN, DM, tachycardia, recent GI bleed. He had an endoscopy at that time of admission but apparently deferred an inpatient colonoscopy. He just had his colonoscopy done by Dr. Magod on Tuesday. By report, this was normal with no source of bleeding identified. He denies any further bloody BMs sin79 yo black male who has a history of stage 4 prostate cancer, HTN, DM, tachycardia, recent GI bleed. He had an endoscopy at that time of admission but apparently deferred an inpatient colonoscopy. He just had his colonoscopy done by Dr. Magod on Tuesday. By report, this was normal with no source of bleeding identified. He denies any further bloody BMs since this admission. This morning around 6 or 7 am, the patient began having centralized crampy abdominal pain. He denies nausea or vomiting, but admits to no flatus. He deals with constipation on a regular basis. Due to persistent pain, the patient presented to [facility] for further evaluation.ce this admission. This morning around 6 or 7 am, the patient began having centralized crampy abdominal pain. He denies nausea or vomiting, but admits to no flatus. He deals with constipation on a regular basis. Due to persistent pain, the patient presented to [facility] for further evaluation.  Yesterday patient coded from a mucus plug. Pt was subsequently  resucsitated and intubated.   Patient is currently intubated on ventilator support MV: 13.3 L/min Temp (24hrs), Avg:97.8 F (36.6 C), Min:96 F (35.6 C), Max:99 F (37.2 C)  Propofol: None  Currently NPO.  Receiving TPN @ 100 mL/hr - meeting needs while pt is on ventilator at current settings.  Pt possibly has Diastolic CHF now according to MD workup, awaiting EKG.  Labs; Ca 6.7, Alk Phos 55, Albumin 1.6, BUN 32, CBGs 308-355  Diet Order:  Diet NPO time specified TPN (CLINIMIX-E) Adult  Skin:  Wound (see comment) (Abdominal incision)  Last BM:  PTA  Height:   Ht Readings from Last 1 Encounters:  06/23/15 6' (1.829 m)    Weight:   Wt Readings from Last 1 Encounters:  06/24/15 229 lb 4.5 oz (104 kg)    Ideal Body Weight:  80.91 kg  BMI:  Body mass index is 31.09 kg/(m^2).  Estimated Nutritional Needs:   Kcal:  2150 calories  Protein:  125-150 grams  Fluid:  Per MD  EDUCATION NEEDS:   No education needs identified at this time   M. , MS, RD LDN After Hours/Weekend Pager [phone]  

## 2015-06-24 NOTE — Progress Notes (Signed)
PARENTERAL NUTRITION CONSULT NOTE - Follow Up  Pharmacy Consult for TPN Indication: Prolonged ileus  No Known Allergies  Patient Measurements: Height: 6' (182.9 cm) Weight: 229 lb 4.5 oz (104 kg) IBW/kg (Calculated) : 77.6 Adjusted Body Weight: 82.4 kg Usual Weight:  95.3 kg  Vital Signs: Temp: 97.9 F (36.6 C) (12/14 0748) Temp Source: Oral (12/14 0336) BP: 136/55 mmHg (12/14 0800) Pulse Rate: 77 (12/14 0800) Intake/Output from previous day: 12/13 0701 - 12/14 0700 In: 5967.3 [I.V.:443.6; IV Piggyback:953.3; TPN:4550.3] Out: 2500 [Urine:1650; Emesis/NG output:850] Intake/Output from this shift: Total I/O In: 248.5 [I.V.:28.5; TPN:220] Out: 225 [Urine:225]  Labs:  Recent Labs  06/22/15 0515 06/23/15 1311 06/24/15 0400  WBC 10.2 10.9* 12.5*  HGB 8.1* 8.0* 7.5*  HCT 24.7* 25.5* 23.0*  PLT 217 250 224  INR 1.24 1.30  --     Recent Labs  06/22/15 0515 06/23/15 0450 06/23/15 1311 06/23/15 2300 06/24/15 0400  NA 135 134* 137 134* 136  K 3.1* 3.3* 4.6 4.2 4.3  CL 108 107 109 108 108  CO2 20* 20* 21* 19* 20*  GLUCOSE 150* 203* 226* 309* 321*  BUN 20 24* 25* 31* 32*  CREATININE 0.86 0.93 1.08 1.04 1.03  CALCIUM 6.3* 6.5* 6.7* 6.5* 6.7*  MG 1.9 1.9  --   --  2.3  PHOS 2.0* 2.5  --   --  2.7  PROT 4.8*  --   --   --  5.4*  ALBUMIN 1.5*  --   --   --  1.6*  AST 46*  --   --   --  61*  ALT 21  --   --   --  20  ALKPHOS 327*  --   --   --  655*  BILITOT 0.5  --   --   --  0.5  PREALBUMIN 2.4*  --   --   --  4.8*  TRIG 149  --   --   --  178*   Estimated Creatinine Clearance: 74.9 mL/min (by C-G formula based on Cr of 1.03).    Recent Labs  06/24/15 0009 06/24/15 0335 06/24/15 0733  GLUCAP 286* 288* 355*   Medications:  Scheduled:  . acetaminophen  1,000 mg Oral TID  . antiseptic oral rinse  7 mL Mouth Rinse QID  . chlorhexidine gluconate  15 mL Mouth Rinse BID  . diltiazem  60 mg Oral 4 times per day  . famotidine (PEPCID) IV  20 mg Intravenous  Q24H  . fentaNYL (SUBLIMAZE) injection  100 mcg Intravenous Once  . fentaNYL (SUBLIMAZE) injection  50 mcg Intravenous Once  . furosemide  40 mg Intravenous Q12H  . heparin subcutaneous  5,000 Units Subcutaneous 3 times per day  . insulin aspart  0-20 Units Subcutaneous 6 times per day  . insulin glargine  16 Units Subcutaneous QHS  . ipratropium-albuterol  3 mL Nebulization Q4H  . lip balm  1 application Topical BID  . methylPREDNISolone (SOLU-MEDROL) injection  60 mg Intravenous Q12H  . metoprolol tartrate  12.5 mg Oral BID  . midazolam  2 mg Intravenous Once  . pantoprazole (PROTONIX) IV  40 mg Intravenous QHS  . piperacillin-tazobactam (ZOSYN)  IV  3.375 g Intravenous Q8H  . prednisoLONE acetate  1 drop Both Eyes BID  . saccharomyces boulardii  250 mg Oral BID  . vancomycin  1,000 mg Intravenous Q12H   Infusions:  . Marland KitchenTPN (CLINIMIX-E) Adult 100 mL/hr at 06/23/15 1716   And  .  fat emulsion 240 mL (06/23/15 1716)  . fentaNYL infusion INTRAVENOUS 50 mcg/hr (06/24/15 0814)  . norepinephrine (LEVOPHED) Adult infusion Stopped (06/23/15 1837)   Insulin Requirements in past 24 hours:  33 unit Novolog SSI yesterday Lantus 10 units q24 30 units in TPN  Current Nutrition:NPO, starting tube feeds  IVF: none  Central access: PICC placed 12/6 TPN start date: 12/6  ASSESSMENT                                                                                                          HPI: 79 year old male with widely metastatic prostate cancer who presented 12/1 with abdominal pain nausea and vomiting. CT scan has shown evidence of small bowel obstruction with possibly a closed loop obstruction. Follow-up x-ray showed no improvement in small bowel obstruction and underwent emergency laparotomy with small bowel resection on 12/2.  Has now developed SBO with internal hernia and has been managed conservatively. Today, 12/6, CCS has consulted pharmacy to begin TPN for prolonged ileus.  Significant  events:  12/10: transferred to ICU 12/11: significant increase in SCr, CBG's remain elevated 12/13: start soft diet, orders to wean TPN to off **afternoon update**pt intubated, consulted to continue TPN  Today: 06/24/2015  Glucose: high-> started steroids   Novolog/Lantus  Current regimen -increased per MD Lantus 16 units q24, Novolog SSI moderate scale q4hr, 30 Units in TPN  Electrolytes:   Ca low  = 6.7, corrects to 8.6 with albumin 1.6 - had required supplementation.   Na WNL  K, Mg and Phos all WNL after supplementation yesterday  Renal: SCr 1.03  LFTs: AlkPhos elevated, others wnl   TGs: 149, 178  Prealbumin: low at 3.5 (12/6),4.4 (12/7), 2.4 (12/12), 4.8 (12/14)   NUTRITIONAL GOALS                                                                                             RD recs (12/2): Kcal: 2300-2500, Protein: 115-130 grams, Fluid: 2.2 L/day Clinimix E 5/15 at a goal rate of 123ml/hr + 20% fat emulsion at 13ml/hr to provide: 120g/day protein, 2184Kcal/day.  PLAN  At 1800 today:  stop Clinimix E 5/15, decrease to 65mls/hr for the last 2 hours then stop  Stop 20% fat emulsion  continue Lantus 16 units once daily (per MD)  Continue resistant SSI q4h per MD  Discontinue TPN lab panels  Dolly Rias RPh 06/24/2015, 10:59 AM Pager [phone]

## 2015-06-24 NOTE — Progress Notes (Signed)
eLink Physician-Brief Progress Note Patient Name: Jeremy Jennings DOB: 11-Jan-1936 MRN: [id number]   Date of Service  06/24/2015  HPI/Events of Note  Patient self extubated. Large clot noted to be obstructing the ETT. Sat = 100% on Room Air. Looks comfortable.   eICU Interventions  Will order: 1. Will leave extubated. [place] O2 for comfort.  2. Incentive spirometry Q 1 hour while awake.      Intervention Category Major Interventions: Respiratory failure - evaluation and management  Arjan Strohm [place] 06/24/2015, 9:21 PM

## 2015-06-24 NOTE — Progress Notes (Signed)
OT Cancellation Note  Patient Details Name: Jeremy Jennings MRN: [id number] DOB: Sep 14, 1935   Cancelled Treatment:    Reason Eval/Treat Not Completed: Medical issues which prohibited therapy  Medical issues which prohibited therapy (pt now on ventilator) [name] 06/24/2015, 8:46 AM

## 2015-06-24 NOTE — Progress Notes (Signed)
PT Cancellation Note  Patient Details Name: Jeremy Jennings MRN: [id number] DOB: April 17, 1936   Cancelled Treatment:    Reason Eval/Treat Not Completed: Medical issues which prohibited therapy (pt now on ventilator)   Shaneice Barsanti,KATHrine E 06/24/2015, 8:44 AM [name], PT, DPT 06/24/2015 Pager: [phone]

## 2015-06-24 NOTE — Progress Notes (Addendum)
Contacted by Pt's RN that he had self extubated at 21:15.  Vent put into standby mode.  Upon examination of the ETT, a large bloody clot was noted to be obstructing the distal end of the ETT.  Pt assessed to note of HR = 106, SpO2 = 100%, B/P = 159/73 on room air.  Pt placed on 4 Lpm Nasal Cannula for comfort. Bilateral breath sounds were clear to ascultation and no stridor was heard in the upper airways.  Pt able to talk in complete sentences and good strong cough.  RT will continue to monitor and assess as needed.

## 2015-06-24 NOTE — Progress Notes (Signed)
PULMONARY / CRITICAL CARE MEDICINE   Name: Jeremy Jennings MRN: [id number] DOB: 06-29-1936    ADMISSION DATE:  06/11/2015 CONSULTATION DATE:  06/23/15  REFERRING MD :  CCS  CHIEF COMPLAINT:  Acute respiratory failure, cardiac arrest  INITIAL PRESENTATION: Admitted on 06/11/15 with abdominal pain. CT scan concerning for small bowel obstruction. Underwent ex-lap and small bowel resection on 06/12/15. Postoperative course notable for sinus tachycardia. Increasing respiratory distress on 12/13. Intubated and had a brief code.  STUDIES:  LE Dopplers 12/9 >> negative for DVT CT scan chest 12/9>> negative for PE, bibasilar pleural effusions and atelectasis Echo 12/9 >>LVEF 25%, mid-systolic LV cavitary obliteration, moderate LVH, upper normal LA size, mild TR, RVSP 34 mmHg + RAP (IVC not visualized). Chest x-ray 12/13 >> left basilar opacity, concerning for pneumonia.  SIGNIFICANT EVENTS: 12/2 >> exploratory laparoscopy, small bowel resection 12/13 >> PEA arrest, intubated  HISTORY OF PRESENT ILLNESS:   Jeremy Jennings is a 79 year old with history of diabetes mellitus, prostate cancer, diastolic heart failure. He is admitted on 12/1 with abdominal pain. He was found to have a small bowel obstruction. He was taken to the OR and underwent exploratory laparoscopy and small bowel resection on 06/12/15. Postop course complicated by ileus, enterococcus UTI. He was persistently in sinus tachycardia. He was evaluated by cardiology and diuresed without any change. He also was ruled out for PE with a CTA and lower extremity Dopplers were negative for DVT on 12/9. He continued to improve slowly.  On 12/13 it was noted that he was more dyspneic, wheezing, increased work of breathing. ABG was done which showed 7.4/33/92/96%. He subsequently underwent a PEA arrest. Time to ROSC was 6 minutes. He received 2 amps of Epi. Intubated for respiratory protection.  PAST MEDICAL HISTORY :   has a past medical history of  Essential hypertension; Type 2 diabetes mellitus ([place]); Hyperlipidemia; Prostate cancer ([place]); History of GI bleed; Tachycardia; and PAC (premature atrial contraction).  has past surgical history that includes Tonsillectomy; Hemorroidectomy (1969); Esophagogastroduodenoscopy (N/A, 05/28/2015); Inguinal hernia repair (Bilateral, AFE 16-17); Hernia repair (1981); laparotomy (N/A, 06/12/2015); and Bowel resection (N/A, 06/12/2015). Prior to Admission medications   Medication Sig Start Date End Date Taking? Authorizing Provider  abiraterone Acetate (ZYTIGA) 250 MG tablet Take 4 tablets (1,000 mg total) by mouth daily. Take on an empty stomach 1 hour before or 2 hours after a meal 05/22/15  Yes Wyatt Portela, MD  ACCU-CHEK SOFTCLIX LANCETS lancets Use as instructed to check blood sugar 2 times per day dx code E11.65 10/23/14  Yes Elayne Snare, MD  acetaminophen (TYLENOL) 650 MG CR tablet Take 1 tablet (650 mg total) by mouth every 8 (eight) hours as needed for pain. 05/30/15  Yes Florencia Reasons, MD  atorvastatin (LIPITOR) 10 MG tablet take 1 tablet by mouth once daily 07/09/14  Yes Darlin Coco, MD  Blood Glucose Monitoring Suppl (ACCU-CHEK AVIVA PLUS) W/DEVICE KIT Use to check blood sugar 2 times per day dx code E11.65 10/23/14  Yes Elayne Snare, MD  carvedilol (COREG) 12.5 MG tablet Take 1 tablet (12.5 mg total) by mouth 2 (two) times daily with a meal. 08/19/14  Yes Darlin Coco, MD  Cholecalciferol (VITAMIN D PO) Take 5,000 Units by mouth daily. Taking 5000 daily   Yes Historical Provider, MD  COMBIGAN 0.2-0.5 % ophthalmic solution Place 1 drop into the right eye daily.  04/09/13  Yes Historical Provider, MD  Cyanocobalamin (VITAMIN B-12 PO) Take 1 tablet by mouth daily.   Yes  Historical Provider, MD  diltiazem (TIAZAC) 360 MG 24 hr capsule Take 360 mg by mouth daily.     Yes Historical Provider, MD  docusate sodium (COLACE) 100 MG capsule Take 100 mg by mouth daily.    Yes Historical Provider, MD  glucose  blood (ACCU-CHEK AVIVA PLUS) test strip Use as instructed to check blood sugar 2 times per day dx code E11.65 10/23/14  Yes Elayne Snare, MD  guaiFENesin (ROBITUSSIN) 100 MG/5ML liquid Take 200 mg by mouth 3 (three) times daily as needed for cough.   Yes Historical Provider, MD  insulin aspart (NOVOLOG FLEXPEN) 100 UNIT/ML FlexPen Inject 12 units 3 times per day with meals 12/05/14  Yes Elayne Snare, MD  Insulin Detemir (LEVEMIR) 100 UNIT/ML Pen Inject 12 Units into the skin daily at 10 pm.   Yes Historical Provider, MD  Insulin Pen Needle (B-D UF III MINI PEN NEEDLES) 31G X 5 MM MISC 1 each by Does not apply route 2 (two) times daily. 04/16/14  Yes Elayne Snare, MD  Liraglutide 18 MG/3ML SOPN Inject 1.8 mg into the skin daily. 06/09/14  Yes Elayne Snare, MD  megestrol (MEGACE) 400 MG/10ML suspension Take 10 mLs (400 mg total) by mouth 2 (two) times daily. 05/18/15  Yes Wyatt Portela, MD  oxyCODONE-acetaminophen (PERCOCET/ROXICET) 5-325 MG tablet Take 1 tablet by mouth every 4 (four) hours as needed for moderate pain or severe pain. 05/30/15  Yes Florencia Reasons, MD  pioglitazone (ACTOS) 30 MG tablet Take 1 tablet (30 mg total) by mouth daily. 01/19/15  Yes Elayne Snare, MD  prednisoLONE acetate (PRED FORTE) 1 % ophthalmic suspension Place 1 drop into both eyes 2 (two) times daily.  04/25/13  Yes Historical Provider, MD  tamsulosin (FLOMAX) 0.4 MG CAPS capsule Take 0.4 mg by mouth daily.  08/02/13  Yes Historical Provider, MD   No Known Allergies  FAMILY HISTORY:  indicated that his mother is deceased. He indicated that his father is deceased.  SOCIAL HISTORY:  reports that he quit smoking about 35 years ago. His smoking use included Cigarettes. He has a 5 pack-year smoking history. He has never used smokeless tobacco. He reports that he does not drink alcohol or use illicit drugs.   SUBJECTIVE: Awake today, no distress.  VITAL SIGNS: Temp:  [96 F (35.6 C)-99 F (37.2 C)] 97.9 F (36.6 C) (12/14 0748) Pulse  Rate:  [43-118] 77 (12/14 0800) Resp:  [10-29] 20 (12/14 0800) BP: (74-200)/(36-107) 136/55 mmHg (12/14 0800) SpO2:  [32 %-100 %] 97 % (12/14 0800) FiO2 (%):  [40 %-100 %] 40 % (12/14 0900) Weight:  [229 lb 4.5 oz (104 kg)] 229 lb 4.5 oz (104 kg) (12/14 0506) HEMODYNAMICS:   VENTILATOR SETTINGS: Vent Mode:  [-] PRVC FiO2 (%):  [40 %-100 %] 40 % Set Rate:  [20 bmp] 20 bmp Vt Set:  [620 mL] 620 mL PEEP:  [5 cmH20] 5 cmH20 Plateau Pressure:  [18 HYW73-71 cmH20] 18 cmH20 INTAKE / OUTPUT:  Intake/Output Summary (Last 24 hours) at 06/24/15 0917 Last data filed at 06/24/15 0900  Gross per 24 hour  Intake 4137.57 ml  Output   2725 ml  Net 1412.57 ml    PHYSICAL EXAMINATION: General:  Awake, responsive Neuro:  PERRLA, no gross focal deficits HEENT:  Moist mucous membranes Cardiovascular:  Regular rate and rhythm, no MRG Lungs:  No wheeze, crackles. Abdomen:  Distended, soft, nontender. Diminished bowel sounds, abdominal incision clean and dry. Skin:  Intact  LABS:  CBC  Recent  Labs Lab 06/22/15 0515 06/23/15 1311 06/24/15 0400  WBC 10.2 10.9* 12.5*  HGB 8.1* 8.0* 7.5*  HCT 24.7* 25.5* 23.0*  PLT 217 250 224   Coag's  Recent Labs Lab 06/22/15 0515 06/23/15 1311  INR 1.24 1.30   BMET  Recent Labs Lab 06/23/15 1311 06/23/15 2300 06/24/15 0400  NA 137 134* 136  K 4.6 4.2 4.3  CL 109 108 108  CO2 21* 19* 20*  BUN 25* 31* 32*  CREATININE 1.08 1.04 1.03  GLUCOSE 226* 309* 321*   Electrolytes  Recent Labs Lab 06/22/15 0515 06/23/15 0450 06/23/15 1311 06/23/15 2300 06/24/15 0400  CALCIUM 6.3* 6.5* 6.7* 6.5* 6.7*  MG 1.9 1.9  --   --  2.3  PHOS 2.0* 2.5  --   --  2.7   Sepsis Markers  Recent Labs Lab 06/19/15 1120 06/19/15 1430 06/23/15 1311  LATICACIDVEN 1.1 1.1 1.6   ABG  Recent Labs Lab 06/23/15 1033 06/23/15 1600  PHART 7.379 7.347*  PCO2ART 33.0* 33.6*  PO2ART 91.6 321*   Liver Enzymes  Recent Labs Lab 06/18/15 0330  06/22/15 0515 06/24/15 0400  AST 31 46* 61*  ALT 11* 21 20  ALKPHOS 346* 327* 655*  BILITOT 0.8 0.5 0.5  ALBUMIN 1.7* 1.5* 1.6*   Cardiac Enzymes  Recent Labs Lab 06/19/15 1120 06/23/15 1311  TROPONINI 0.04* <0.03   Glucose  Recent Labs Lab 06/23/15 1306 06/23/15 1701 06/23/15 2020 06/24/15 0009 06/24/15 0335 06/24/15 0733  GLUCAP 176* 240* 308* 286* 288* 355*    Imaging Dg Chest Port 1 View  06/24/2015  CLINICAL DATA:  Respiratory failure. EXAM: PORTABLE CHEST 1 VIEW COMPARISON:  06/23/2015. FINDINGS: Endotracheal tube, NG tube, left IJ line, right PICC line in stable position. Mediastinum hilar structures are stable. Cardiomegaly with diffuse bilateral pulmonary interstitial prominence consistent with congestive heart failure. Pneumonitis cannot be excluded. Persistent right mid lung and left base subsegmental atelectasis. No prominent pleural effusion. No pneumothorax. IMPRESSION: 1. Lines and tubes in stable position. 2. Persistent cardiomegaly. Interim appearance of bilateral from interstitial prominence. Findings suggest congestive heart failure. 3. Persistent low lung volumes with persistent right mid lung and left base subsegmental atelectasis. Electronically Signed   By: [name]  Register   On: 06/24/2015 07:18   Dg Chest Port 1 View  06/23/2015  CLINICAL DATA:  Central line placement EXAM: PORTABLE CHEST 1 VIEW COMPARISON:  06/23/2015 FINDINGS: Left internal jugular central line has been placed with the tip in the upper SVC. No pneumothorax. Endotracheal tube is 3 cm above the carina. NG tube is in the stomach. Right PICC line is unchanged. Patchy right upper lobe and left lower lobe airspace opacities are again noted, unchanged. Low lung volumes. IMPRESSION: Left central line placement with the tip in the upper SVC. No pneumothorax. Otherwise no significant change. Electronically Signed   By: Rolm Baptise M.D.   On: 06/23/2015 17:15   Dg Chest Port 1  View  06/23/2015  CLINICAL DATA:  ET tube repositioning EXAM: PORTABLE CHEST 1 VIEW COMPARISON:  06/23/2015 FINDINGS: Endotracheal tube has been retracted and is approximately 2 cm above the carina. Right PICC line is unchanged as is the NG tube. There is mild cardiomegaly. Left lower lobe atelectasis or infiltrate noted. Patchy right upper lobe opacity also noted. These are unchanged since prior study. IMPRESSION: Interval retraction of the endotracheal tube, now approximately 2 cm above the carina. Stable patchy right upper lobe and left lower lobe airspace opacities. Electronically Signed  By: Rolm Baptise M.D.   On: 06/23/2015 16:29   Dg Chest Port 1 View  06/23/2015  CLINICAL DATA:  Status post endotracheal intubation EXAM: PORTABLE CHEST - 1 VIEW COMPARISON:  06/23/2015 FINDINGS: Cardiac shadow is stable. Some mild atelectatic changes remain in the left lung base and right mid lung. Nasogastric catheter is been placed but is coiled at the level of the diaphragm in may lie within a hiatal hernia. A right-sided PICC line is noted at the cavoatrial junction. An endotracheal tube is seen at the level of the carina directing slightly into the right mainstem bronchus. This should be withdrawn approximately 4 cm. No other focal abnormality is noted. IMPRESSION: Tubes and lines as described above. The endotracheal tube should be withdrawn 4 cm. Bilateral patchy atelectatic changes. These results were called by telephone at the time of interpretation on 06/23/2015 at 2:14 pm to [facility], the patients nurse Who verbally acknowledged these results. Electronically Signed   By: Inez Catalina M.D.   On: 06/23/2015 14:19   ASSESSMENT / PLAN:  79 year old metastatic prostate cancer. Status post exploratory laparoscopy with bowel resection for small bowel obstruction. Intubated for acute respiratory failure with wheezing and increased work of breathing. Brief PEA arrest  Reason for his decompensation is not clear. He  was wheezing before his intubation and may have an element of COPD although his smoking history is minimal. He does have infiltrates on chest x-ray suggestive of HCAP . He may have mucous plug leading up to his respiratory arrest. PE, DVT was ruled out on 12/9. I do not think he has developed a PE in the interim.  PULMONARY OETT 12/13 >> A: Acute respiratory failure HCAP ? COPD exacerbation ? PE P:   8 mL/kg tidal volume ventilation. Continue antibiotics to Vanco and Zosyn Duo nebs every 4 Continue Solu-Medrol 60 every 12 hrs  CARDIOVASCULAR CVL A:  Sinus tachycardia Diastolic heart failure P:  Restart Lasix 40 IV bid. Continue Cardizem and Lopressor as per cardiology.  RENAL A:   Stable P:   Monitor BUN/creatinine, urine output Repeat lytes as indicated  GASTROINTESTINAL A:   Small bowel obstruction with hernia Status post exploratory laparoscopy, small bowel resection Postop ileus P:   Weaning off TPN. Keep nothing by mouth for now Start tube feeds at 20 mL/hr Protonix for SUP  HEMATOLOGIC A: Stable P:   INFECTIOUS A:  HCAP Enterococcus UTI. P:   BCx2 >> UC 12/9 >> Enterococcus Sputum >> Abx: Vano, start date 12/13,  Zosyn, start date 12/13,   Antibiotics broadened to Vanco, Zosyn. Follow sputum cultures, ICU procalcitonin protocol  ENDOCRINE A:  Diabetes mellitus P:   Continue Lantus. Increase dose to 16 Change SSI to high-dose as he is on steroids.  NEUROLOGIC A:    P:   RASS goal: 0 Fentanyl drip and Versed when necessary  FAMILY  - Updates: Daughter updated at bedside. - Inter-disciplinary family meet or Palliative Care meeting due by: 12/20.  Critical care time-35 minutes.  TODAY'S SUMMARY:   Marshell Garfinkel MD [place] [facility] Pager [phone] If no answer or after 3pm call: [phone] 06/24/2015, 9:17 AM

## 2015-06-25 ENCOUNTER — Inpatient Hospital Stay (HOSPITAL_COMMUNITY): Payer: Medicare Other

## 2015-06-25 LAB — BASIC METABOLIC PANEL
Anion gap: 13 (ref 5–15)
BUN: 30 mg/dL — ABNORMAL HIGH (ref 6–20)
CALCIUM: 6.9 mg/dL — AB (ref 8.9–10.3)
CHLORIDE: 107 mmol/L (ref 101–111)
CO2: 21 mmol/L — AB (ref 22–32)
CREATININE: 1.07 mg/dL (ref 0.61–1.24)
GFR calc non Af Amer: >60 mL/min (ref >60)
GLUCOSE: 185 mg/dL — AB (ref 65–99)
Potassium: 3.7 mmol/L (ref 3.5–5.1)
Sodium: 141 mmol/L (ref 135–145)

## 2015-06-25 LAB — GLUCOSE, CAPILLARY
GLUCOSE-CAPILLARY: 161 mg/dL — AB (ref 65–99)
GLUCOSE-CAPILLARY: 206 mg/dL — AB (ref 65–99)
GLUCOSE-CAPILLARY: 267 mg/dL — AB (ref 65–99)
GLUCOSE-CAPILLARY: 275 mg/dL — AB (ref 65–99)
Glucose-Capillary: 194 mg/dL — ABNORMAL HIGH (ref 65–99)
Glucose-Capillary: 200 mg/dL — ABNORMAL HIGH (ref 65–99)
Glucose-Capillary: 209 mg/dL — ABNORMAL HIGH (ref 65–99)
Glucose-Capillary: 206 mg/dL — ABNORMAL HIGH (ref 65–99)

## 2015-06-25 LAB — PROCALCITONIN: Procalcitonin: 1.31 ng/mL

## 2015-06-25 MED ORDER — HALOPERIDOL LACTATE 5 MG/ML IJ SOLN
2.0000 mg | INTRAMUSCULAR | Status: AC
  Administered 2015-06-25: 2 mg via INTRAVENOUS
  Filled 2015-06-25: qty 1

## 2015-06-25 MED ORDER — MORPHINE SULFATE (PF) 2 MG/ML IV SOLN: 2.0000 mg | INTRAVENOUS | Status: DC | PRN

## 2015-06-25 MED ORDER — BOOST / RESOURCE BREEZE PO LIQD: 1.0000 | Freq: Three times a day (TID) | ORAL | Status: DC

## 2015-06-25 MED ORDER — OXYCODONE HCL 5 MG PO TABS
5.0000 mg | ORAL_TABLET | ORAL | Status: DC | PRN
  Administered 2015-06-25: 10 mg via ORAL
  Filled 2015-06-25: qty 2

## 2015-06-25 MED ORDER — METOPROLOL TARTRATE 1 MG/ML IV SOLN
10.0000 mg | Freq: Three times a day (TID) | INTRAVENOUS | Status: DC
  Administered 2015-06-25 – 2015-06-28 (×11): 10 mg via INTRAVENOUS
  Filled 2015-06-25 (×12): qty 10

## 2015-06-25 MED ORDER — DILTIAZEM HCL ER BEADS 120 MG PO CP24
360.0000 mg | ORAL_CAPSULE | Freq: Every day | ORAL | Status: DC
  Administered 2015-06-25 – 2015-06-26 (×2): 360 mg via ORAL
  Filled 2015-06-25 (×3): qty 1

## 2015-06-25 MED ORDER — JUVEN PO PACK
1.0000 | PACK | Freq: Two times a day (BID) | ORAL | Status: DC
  Administered 2015-06-25 – 2015-06-26 (×3): 1 via ORAL
  Filled 2015-06-25 (×9): qty 1

## 2015-06-25 NOTE — Progress Notes (Signed)
PULMONARY / CRITICAL CARE MEDICINE   Name: Jeremy Jennings MRN: [id number] DOB: 08/31/1935    ADMISSION DATE:  06/11/2015 CONSULTATION DATE:  06/23/15  REFERRING MD :  CCS  CHIEF COMPLAINT:  Acute respiratory failure, cardiac arrest  INITIAL PRESENTATION: Admitted on 06/11/15 with abdominal pain. CT scan concerning for small bowel obstruction. Underwent ex-lap and small bowel resection on 06/12/15. Postoperative course notable for sinus tachycardia. Increasing respiratory distress on 12/13. Intubated and had a brief code.  STUDIES:  LE Dopplers 12/9 >> negative for DVT CT scan chest 12/9>> negative for PE, bibasilar pleural effusions and atelectasis Echo 12/9 >>LVEF 75%, mid-systolic LV cavitary obliteration, moderate LVH, upper normal LA size, mild TR, RVSP 34 mmHg + RAP (IVC not visualized). Chest x-ray 12/13 >> left basilar opacity, concerning for pneumonia.  SIGNIFICANT EVENTS: 12/2 >> exploratory laparoscopy, small bowel resection 12/13 >> PEA arrest, intubated 12/14 >> self extubated  HISTORY OF PRESENT ILLNESS:   Jeremy Jennings is a 79-year-old with history of diabetes mellitus, prostate cancer, diastolic heart failure. He is admitted on 12/1 with abdominal pain. He was found to have a small bowel obstruction. He was taken to the OR and underwent exploratory laparoscopy and small bowel resection on 06/12/15. Postop course complicated by ileus, enterococcus UTI. He was persistently in sinus tachycardia. He was evaluated by cardiology and diuresed without any change. He also was ruled out for PE with a CTA and lower extremity Dopplers were negative for DVT on 12/9.   On 12/13 it was noted that he was more dyspneic, wheezing, increased work of breathing. ABG was done which showed 7.4/33/92/96%. He subsequently underwent a PEA arrest. Time to ROSC was 6 minutes. He received 2 amps of Epi. Intubated for respiratory protection. Self extubated the next day.   PAST MEDICAL HISTORY :   has a  past medical history of Essential hypertension; Type 2 diabetes mellitus (HCC); Hyperlipidemia; Prostate cancer (HCC); History of GI bleed; Tachycardia; and PAC (premature atrial contraction).  has past surgical history that includes Tonsillectomy; Hemorroidectomy (1969); Esophagogastroduodenoscopy (N/A, 05/28/2015); Inguinal hernia repair (Bilateral, AFE 16-17); Hernia repair (1981); laparotomy (N/A, 06/12/2015); and Bowel resection (N/A, 06/12/2015). Prior to Admission medications   Medication Sig Start Date End Date Taking? Authorizing Provider  abiraterone Acetate (ZYTIGA) 250 MG tablet Take 4 tablets (1,000 mg total) by mouth daily. Take on an empty stomach 1 hour before or 2 hours after a meal 05/22/15  Yes Firas N Shadad, MD  ACCU-CHEK SOFTCLIX LANCETS lancets Use as instructed to check blood sugar 2 times per day dx code E11.65 10/23/14  Yes Ajay Kumar, MD  acetaminophen (TYLENOL) 650 MG CR tablet Take 1 tablet (650 mg total) by mouth every 8 (eight) hours as needed for pain. 05/30/15  Yes Fang Xu, MD  atorvastatin (LIPITOR) 10 MG tablet take 1 tablet by mouth once daily 07/09/14  Yes Thomas Brackbill, MD  Blood Glucose Monitoring Suppl (ACCU-CHEK AVIVA PLUS) W/DEVICE KIT Use to check blood sugar 2 times per day dx code E11.65 10/23/14  Yes Ajay Kumar, MD  carvedilol (COREG) 12.5 MG tablet Take 1 tablet (12.5 mg total) by mouth 2 (two) times daily with a meal. 08/19/14  Yes Thomas Brackbill, MD  Cholecalciferol (VITAMIN D PO) Take 5,000 Units by mouth daily. Taking 5000 daily   Yes Historical Provider, MD  COMBIGAN 0.2-0.5 % ophthalmic solution Place 1 drop into the right eye daily.  04/09/13  Yes Historical Provider, MD  Cyanocobalamin (VITAMIN B-12 PO) Take 1 tablet   by mouth daily.   Yes Historical Provider, MD  diltiazem (TIAZAC) 360 MG 24 hr capsule Take 360 mg by mouth daily.     Yes Historical Provider, MD  docusate sodium (COLACE) 100 MG capsule Take 100 mg by mouth daily.    Yes Historical  Provider, MD  glucose blood (ACCU-CHEK AVIVA PLUS) test strip Use as instructed to check blood sugar 2 times per day dx code E11.65 10/23/14  Yes Elayne Snare, MD  guaiFENesin (ROBITUSSIN) 100 MG/5ML liquid Take 200 mg by mouth 3 (three) times daily as needed for cough.   Yes Historical Provider, MD  insulin aspart (NOVOLOG FLEXPEN) 100 UNIT/ML FlexPen Inject 12 units 3 times per day with meals 12/05/14  Yes Elayne Snare, MD  Insulin Detemir (LEVEMIR) 100 UNIT/ML Pen Inject 12 Units into the skin daily at 10 pm.   Yes Historical Provider, MD  Insulin Pen Needle (B-D UF III MINI PEN NEEDLES) 31G X 5 MM MISC 1 each by Does not apply route 2 (two) times daily. 04/16/14  Yes Elayne Snare, MD  Liraglutide 18 MG/3ML SOPN Inject 1.8 mg into the skin daily. 06/09/14  Yes Elayne Snare, MD  megestrol (MEGACE) 400 MG/10ML suspension Take 10 mLs (400 mg total) by mouth 2 (two) times daily. 05/18/15  Yes Wyatt Portela, MD  oxyCODONE-acetaminophen (PERCOCET/ROXICET) 5-325 MG tablet Take 1 tablet by mouth every 4 (four) hours as needed for moderate pain or severe pain. 05/30/15  Yes Florencia Reasons, MD  pioglitazone (ACTOS) 30 MG tablet Take 1 tablet (30 mg total) by mouth daily. 01/19/15  Yes Elayne Snare, MD  prednisoLONE acetate (PRED FORTE) 1 % ophthalmic suspension Place 1 drop into both eyes 2 (two) times daily.  04/25/13  Yes Historical Provider, MD  tamsulosin (FLOMAX) 0.4 MG CAPS capsule Take 0.4 mg by mouth daily.  08/02/13  Yes Historical Provider, MD   No Known Allergies  FAMILY HISTORY:  indicated that his mother is deceased. He indicated that his father is deceased.  SOCIAL HISTORY:  reports that he quit smoking about 35 years ago. His smoking use included Cigarettes. He has a 5 pack-year smoking history. He has never used smokeless tobacco. He reports that he does not drink alcohol or use illicit drugs.   SUBJECTIVE: Mildly confused, no distress.  VITAL SIGNS: Temp:  [97.5 F (36.4 C)-98.4 F (36.9 C)] 97.6 F  (36.4 C) (12/15 0800) Pulse Rate:  [76-120] 109 (12/15 0700) Resp:  [11-23] 16 (12/15 0700) BP: (126-195)/(51-159) 166/75 mmHg (12/15 0700) SpO2:  [95 %-100 %] 96 % (12/15 0859) FiO2 (%):  [40 %] 40 % (12/14 1800) Weight:  [229 lb 15 oz (104.3 kg)] 229 lb 15 oz (104.3 kg) (12/15 0500) HEMODYNAMICS: CVP:  [5 mmHg-10 mmHg] 10 mmHg VENTILATOR SETTINGS: Vent Mode:  [-] CPAP;PSV FiO2 (%):  [40 %] 40 % PEEP:  [5 cmH20] 5 cmH20 Pressure Support:  [5 cmH20-10 cmH20] 10 cmH20 INTAKE / OUTPUT:  Intake/Output Summary (Last 24 hours) at 06/25/15 0932 Last data filed at 06/25/15 0700  Gross per 24 hour  Intake 1741.04 ml  Output   2685 ml  Net -943.96 ml    PHYSICAL EXAMINATION: General:  Awake, confused Neuro:  PERRLA, no gross focal deficits HEENT:  Moist mucous membranes Cardiovascular:  Regular rate and rhythm, no MRG Lungs:  Bilateral expiratory wheeze, no crackles Abdomen:  Distended, soft, nontender. Diminished bowel sounds, abdominal incision clean and dry. Skin:  Intact  LABS:  CBC  Recent Labs Lab 06/22/15  0515 06/23/15 1311 06/24/15 0400  WBC 10.2 10.9* 12.5*  HGB 8.1* 8.0* 7.5*  HCT 24.7* 25.5* 23.0*  PLT 217 250 224   Coag's  Recent Labs Lab 06/22/15 0515 06/23/15 1311  INR 1.24 1.30   BMET  Recent Labs Lab 06/23/15 2300 06/24/15 0400 06/25/15 0530  NA 134* 136 141  K 4.2 4.3 3.7  CL 108 108 107  CO2 19* 20* 21*  BUN 31* 32* 30*  CREATININE 1.04 1.03 1.07  GLUCOSE 309* 321* 185*   Electrolytes  Recent Labs Lab 06/22/15 0515 06/23/15 0450  06/23/15 2300 06/24/15 0400 06/25/15 0530  CALCIUM 6.3* 6.5*  < > 6.5* 6.7* 6.9*  MG 1.9 1.9  --   --  2.3  --   PHOS 2.0* 2.5  --   --  2.7  --   < > = values in this interval not displayed. Sepsis Markers  Recent Labs Lab 06/19/15 1120 06/19/15 1430 06/23/15 1311 06/24/15 0400 06/25/15 0530  LATICACIDVEN 1.1 1.1 1.6  --   --   PROCALCITON  --   --   --  1.75 1.31   ABG  Recent  Labs Lab 06/23/15 1033 06/23/15 1600  PHART 7.379 7.347*  PCO2ART 33.0* 33.6*  PO2ART 91.6 321*   Liver Enzymes  Recent Labs Lab 06/22/15 0515 06/24/15 0400  AST 46* 61*  ALT 21 20  ALKPHOS 327* 655*  BILITOT 0.5 0.5  ALBUMIN 1.5* 1.6*   Cardiac Enzymes  Recent Labs Lab 06/19/15 1120 06/23/15 1311  TROPONINI 0.04* <0.03   Glucose  Recent Labs Lab 06/24/15 1205 06/24/15 1609 06/24/15 2002 06/24/15 2339 06/25/15 0342 06/25/15 0805  GLUCAP 341* 271* 267* 200* 194* 209*    Imaging Dg Chest Port 1 View  06/25/2015  CLINICAL DATA:  Respiratory failure. EXAM: PORTABLE CHEST 1 VIEW COMPARISON:  06/24/2015. FINDINGS: Interim extubation and removal of NG tube . Right PICC line, left IJ line stable position. Cardiomegaly. Persistent interstitial prominence, interstitial edema cannot be excluded. Persistent low lung volumes with right mid lung field and left base atelectasis and/or infiltrates. No pleural effusion or pneumothorax. IMPRESSION: 1. Interim extubation removal of NG tube. Left IJ line and right PICC line stable position. 2. Persistent cardiomegaly pulmonary interstitial prominence suggesting interstitial edema. No interim change. 3. Persistent low lung volumes with right mid lung and left base subsegmental atelectasis. Electronically Signed   By: Thomas  Register   On: 06/25/2015 07:18   ASSESSMENT / PLAN:  79-year-old metastatic prostate cancer. Status post exploratory laparoscopy with bowel resection for small bowel obstruction. Intubated for acute respiratory failure with wheezing and increased work of breathing. Brief PEA arrest  Reason for his decompensation is not clear. He was wheezing before his intubation and may have an element of COPD although his smoking history is minimal. He does have infiltrates on chest x-ray suggestive of HCAP . He may have mucous plug leading up to his respiratory arrest. PE, DVT was ruled out on 12/9. I do not think he has  developed a PE in the interim.  PULMONARY OETT 12/13 >> 12/14 A: Acute respiratory failure HCAP ? COPD exacerbation ? PE P:   Wean down FiO2 as tolerated Duo nebs every 4 Continue Solu-Medrol 60 every 12 hrs as he still wheezing.  CARDIOVASCULAR CVL A:  Sinus tachycardia Diastolic heart failure P:  Continue Lasix 40 IV bid. Continue Cardizem and Lopressor as per cardiology.  RENAL A:   Stable P:   Monitor BUN/creatinine, urine output   Repeat lytes as indicated  GASTROINTESTINAL A:   Small bowel obstruction with hernia Status post exploratory laparoscopy, small bowel resection Postop ileus P:   Starting clears Protonix for SUP  HEMATOLOGIC A: Stable P:   INFECTIOUS A:  HCAP Enterococcus UTI. P:   BCx2 >> UC 12/9 >> Enterococcus Sputum >> Abx: Vano, start date 12/13,  Zosyn, start date 12/13,   Antibiotics broadened to Vanco, Zosyn. Follow sputum cultures, ICU procalcitonin protocol  ENDOCRINE A:  Diabetes mellitus P:   Continue Lantus 16 U SSI high-dose scale as he is on steroids.  NEUROLOGIC A:    P:   RASS goal: 0 Fentanyl drip and Versed when necessary  FAMILY  - Updates: Daughter updated at bedside 12/14 - Inter-disciplinary family meet or Palliative Care meeting due by: 12/20.  Critical care time-35 minutes.  TODAY'S SUMMARY:     MD [facility] Pager [phone] If no answer or after 3pm call: [phone] 06/25/2015, 9:32 AM  

## 2015-06-25 NOTE — Progress Notes (Addendum)
Occupational Therapy Treatment Patient Details Name: Jeremy Jennings MRN: [id number] DOB: [birth year] Today's Date: 06/25/2015    History of present illness 79 yo black male who has a history of stage 4 prostate cancer, HTN, DM, tachycardia, recent GI bleed.  Pt admitted 12/1 with centralized crampy abdominal pain.  Due to persistent pain, the patient presented to [facility] for further evaluation.Dx of SBO, s/p resection 06/12/15.  High Temp 06/21/15, to SDU.   OT comments  Pt currently requiring +2 mod assist to pivot from [facility] to recliner and fatigues quickly. HR 120 with activity. Down to 108 once in chair resting. Nursing in room during session and assisted with functional transfers. Pt self extubated on 06/24/15.  Feel pt will need SNF at d/c as he is requiring extensive assist. Daughter present at the end of the session, discussed OT recommendation with her and she is agreeable to SNF at d/c. Will follow on acute.    Follow Up Recommendations  SNF;Supervision/Assistance - 24 hour    Equipment Recommendations  3 in 1 bedside comode    Recommendations for Other Services      Precautions / Restrictions Precautions Precautions: Fall Precaution Comments: much weaker than last week. Restrictions Weight Bearing Restrictions: No       Mobility Bed Mobility               General bed mobility comments: on BSC when OT arrived.   Transfers Overall transfer level: Needs assistance Equipment used: None Transfers: Sit to/from [facility] Sit to Stand: +2 physical assistance;Mod assist;+2 safety/equipment Stand pivot transfers: Mod assist;+2 physical assistance;+2 safety/equipment       General transfer comment: pt unsteady in standing requring +2 for balance support. mod verbal cues for technique and safety. Pt tended to "plop" back into recliner with fatigue.     Balance Overall balance assessment: Needs assistance           Standing balance-Leahy Scale:  Poor                     ADL       Grooming: Wash/dry face;Sitting;Set up;Supervision/safety       Lower Body Bathing: +2 for physical assistance;+2 for safety/equipment;Sit to/from stand;Moderate assistance           Toilet Transfer: +2 for physical assistance;+2 for safety/equipment;Moderate assistance;Stand-pivot   Toileting- Clothing Manipulation and Hygiene: +2 for physical assistance;+2 for safety/equipment;Total assistance;Sit to/from stand         General ADL Comments: Pt on BSC when OT arrived. Nursing present and assisted with +2 transfer from [facility] to recliner and management of lines. Pt hallucinating during session, stating he thought he saw leaves blowing at this feet. Nursing aware and MD in room during session and nursing reported this to MD also. Pt fatigues rapidly and was tired after bathing on BSC and pivot to chair.       Vision                     Perception     Praxis      Cognition   Behavior During Therapy: Restless Overall Cognitive Status: Impaired/Different from baseline Area of Impairment: Orientation Orientation Level: Situation;Time              General Comments: pt able to state in the hospital. He is hallucinating and seeing leaves blowing around rooom. Nursing made aware.     Extremity/Trunk Assessment  Exercises     Shoulder Instructions       General Comments      Pertinent Vitals/ Pain       Pain Assessment: Faces Faces Pain Scale: Hurts even more Pain Location: scrotum with sitting in chair.  Pain Descriptors / Indicators: Discomfort Pain Intervention(s): Repositioned  Home Living                                          Prior Functioning/Environment              Frequency Min 2X/week     Progress Toward Goals  OT Goals(current goals can now be found in the care plan section)  Progress towards OT goals: Not progressing toward goals - comment  (requiring more assist. )     Plan Discharge plan needs to be updated    Co-evaluation                 End of Session Equipment Utilized During Treatment: Gait belt   Activity Tolerance Patient limited by fatigue   Patient Left in chair;with call bell/phone within reach;with chair alarm set;with family/visitor present   Nurse Communication          Time: [id number] OT Time Calculation (min): 40 min  Charges: OT General Charges $OT Visit: 1 Procedure OT Treatments $Self Care/Home Management : 8-22 mins $Therapeutic Activity: 23-37 mins  Jules Schick  [id number] 06/25/2015, 10:01 AM

## 2015-06-25 NOTE — Progress Notes (Signed)
Patient ID: REG BIRCHER, male   DOB: [birth year], 79 y.o.   MRN: [id number]     [facility]      [address] [place], [place] [address]    Phone: [phone] FAX: [phone]     Subjective: Family at bedside, also spoke with daughter [place] via telephone. Self extubated overnight, NAD, awake and talking.  Having bowel function. VSS.  Afebrile. Good UOP.    Objective:  Vital signs:  Filed Vitals:   06/25/15 0405 06/25/15 0500 06/25/15 0600 06/25/15 0700  BP:  172/74 195/159 166/75  Pulse:  105 120 109  Temp:      TempSrc:      Resp:  17 18 16   Height:      Weight:  104.3 kg (229 lb 15 oz)    SpO2: 100% 100% 100% 100%    Last BM Date: 06/23/15  Intake/Output   Yesterday:  12/14 0701 - 12/15 0700 In: 2009.5 [I.V.:115.4; NG/GT:60; IV Piggyback:600; TPN:1124.2] Out: 3135 [Urine:3135] This shift:    I/O last 3 completed shifts: In: 3882 [I.V.:357.9; Other:120; NG/GT:60; IV GXQJJHERD:408] Out: 1448 [Urine:4135; Emesis/NG output:350]    Physical Exam: General: Pt awake/alert/oriented x4 in no acute distress Chest: cta.  No chest wall pain w good excursion CV:  Pulses intact.  Regular rhythm MS: Normal AROM mjr joints.  No obvious deformity Abdomen: Soft.  Nondistended.  Mildly tender at incisions only.  Incision without erythema, staples in place.  No evidence of peritonitis.  No incarcerated hernias. Ext:  +2 ble edema.    Problem List:   Principal Problem:   SBO (small bowel obstruction) ischemic s/p LOA & SB resection 06/12/2015 Active Problems:   Hyperlipidemia   Hypertension   Prostate cancer ([place])   Anemia   HCAP (healthcare-associated pneumonia)   Sinus tachycardia (HCC)   Metabolic acidosis   Type 2 diabetes mellitus (HCC)   Enterococcus UTI   Hypertensive cardiovascular disease-EF 75%, LVH   Dyspnea-diastolic CHF   Acute respiratory failure ([place])   Cardiac arrest due to respiratory disorder  [facility])    Results:   Labs: Results for orders placed or performed during the hospital encounter of 06/11/15 (from the past 48 hour(s))  Glucose, capillary     Status: Abnormal   Collection Time: 06/23/15  7:59 AM  Result Value Ref Range   Glucose-Capillary 195 (H) 65 - 99 mg/dL   Comment 1 Notify RN    Comment 2 Document in Chart   Blood gas, arterial     Status: Abnormal   Collection Time: 06/23/15 10:33 AM  Result Value Ref Range   O2 Content 2.0 L/min   Delivery systems NASAL CANNULA    pH, Arterial 7.379 7.350 - 7.450   pCO2 arterial 33.0 (L) 35.0 - 45.0 mmHg   pO2, Arterial 91.6 80.0 - 100.0 mmHg   Bicarbonate 19.1 (L) 20.0 - 24.0 mEq/L   TCO2 18.1 0 - 100 mmol/L   Acid-base deficit 4.9 (H) 0.0 - 2.0 mmol/L   O2 Saturation 96.5 %   Patient temperature 98.6    Collection site LEFT RADIAL    Drawn by [id number]    Sample type ARTERIAL DRAW    Allens test (pass/fail) PASS PASS  Glucose, capillary     Status: Abnormal   Collection Time: 06/23/15 11:55 AM  Result Value Ref Range   Glucose-Capillary 193 (H) 65 - 99 mg/dL  Glucose, capillary     Status: Abnormal  Collection Time: 06/23/15  1:06 PM  Result Value Ref Range   Glucose-Capillary 176 (H) 65 - 99 mg/dL  Basic metabolic panel     Status: Abnormal   Collection Time: 06/23/15  1:11 PM  Result Value Ref Range   Sodium 137 135 - 145 mmol/L   Potassium 4.6 3.5 - 5.1 mmol/L    Comment: DELTA CHECK NOTED REPEATED TO VERIFY NO VISIBLE HEMOLYSIS    Chloride 109 101 - 111 mmol/L   CO2 21 (L) 22 - 32 mmol/L   Glucose, Bld 226 (H) 65 - 99 mg/dL   BUN 25 (H) 6 - 20 mg/dL   Creatinine, Ser 1.08 0.61 - 1.24 mg/dL   Calcium 6.7 (L) 8.9 - 10.3 mg/dL   GFR calc non Af Amer >60 >60 mL/min   GFR calc Af Amer >60 >60 mL/min    Comment: (NOTE) The eGFR has been calculated using the CKD EPI equation. This calculation has not been validated in all clinical situations. eGFR's persistently <60 mL/min signify possible Chronic  Kidney Disease.    Anion gap 7 5 - 15  CBC     Status: Abnormal   Collection Time: 06/23/15  1:11 PM  Result Value Ref Range   WBC 10.9 (H) 4.0 - 10.5 K/uL   RBC 2.78 (L) 4.22 - 5.81 MIL/uL   Hemoglobin 8.0 (L) 13.0 - 17.0 g/dL   HCT 25.5 (L) 39.0 - 52.0 %   MCV 91.7 78.0 - 100.0 fL   MCH 28.8 26.0 - 34.0 pg   MCHC 31.4 30.0 - 36.0 g/dL   RDW 17.0 (H) 11.5 - 15.5 %   Platelets 250 150 - 400 K/uL  Lactic acid, plasma     Status: None   Collection Time: 06/23/15  1:11 PM  Result Value Ref Range   Lactic Acid, Venous 1.6 0.5 - 2.0 mmol/L  Troponin I     Status: None   Collection Time: 06/23/15  1:11 PM  Result Value Ref Range   Troponin I <0.03 <0.031 ng/mL    Comment:        NO INDICATION OF MYOCARDIAL INJURY.   Protime-INR     Status: Abnormal   Collection Time: 06/23/15  1:11 PM  Result Value Ref Range   Prothrombin Time 16.3 (H) 11.6 - 15.2 seconds   INR 1.30 0.00 - 1.49  Fibrinogen     Status: Abnormal   Collection Time: 06/23/15  1:11 PM  Result Value Ref Range   Fibrinogen 692 (H) 204 - 475 mg/dL  Blood gas, arterial     Status: Abnormal   Collection Time: 06/23/15  4:00 PM  Result Value Ref Range   FIO2 1.00    Delivery systems VENTILATOR    Mode PRESSURE REGULATED VOLUME CONTROL    VT 620 mL   LHR 20 resp/min   Peep/cpap 5.0 cm H20   pH, Arterial 7.347 (L) 7.350 - 7.450   pCO2 arterial 33.6 (L) 35.0 - 45.0 mmHg   pO2, Arterial 321 (H) 80.0 - 100.0 mmHg   Bicarbonate 18.2 (L) 20.0 - 24.0 mEq/L   TCO2 17.6 0 - 100 mmol/L   Acid-base deficit 6.5 (H) 0.0 - 2.0 mmol/L   O2 Saturation 99.6 %   Patient temperature 97.0    Collection site LEFT RADIAL    Drawn by [id number]    Sample type ARTERIAL DRAW    Allens test (pass/fail) PASS PASS  Culture, respiratory (NON-Expectorated)     Status: None (Preliminary result)  Collection Time: 06/23/15  4:36 PM  Result Value Ref Range   Specimen Description TRACHEAL ASPIRATE    Special Requests NONE    Gram Stain       ABUNDANT WBC PRESENT,BOTH PMN AND MONONUCLEAR FEW SQUAMOUS EPITHELIAL CELLS PRESENT NO ORGANISMS SEEN Performed at [facility]    Culture PENDING    Report Status PENDING   Glucose, capillary     Status: Abnormal   Collection Time: 06/23/15  5:01 PM  Result Value Ref Range   Glucose-Capillary 240 (H) 65 - 99 mg/dL  Glucose, capillary     Status: Abnormal   Collection Time: 06/23/15  8:20 PM  Result Value Ref Range   Glucose-Capillary 308 (H) 65 - 99 mg/dL   Comment 1 Notify RN   Basic metabolic panel     Status: Abnormal   Collection Time: 06/23/15 11:00 PM  Result Value Ref Range   Sodium 134 (L) 135 - 145 mmol/L   Potassium 4.2 3.5 - 5.1 mmol/L   Chloride 108 101 - 111 mmol/L   CO2 19 (L) 22 - 32 mmol/L   Glucose, Bld 309 (H) 65 - 99 mg/dL   BUN 31 (H) 6 - 20 mg/dL   Creatinine, Ser 1.04 0.61 - 1.24 mg/dL   Calcium 6.5 (L) 8.9 - 10.3 mg/dL   GFR calc non Af Amer >60 >60 mL/min   GFR calc Af Amer >60 >60 mL/min    Comment: (NOTE) The eGFR has been calculated using the CKD EPI equation. This calculation has not been validated in all clinical situations. eGFR's persistently <60 mL/min signify possible Chronic Kidney Disease.    Anion gap 7 5 - 15  Glucose, capillary     Status: Abnormal   Collection Time: 06/24/15 12:09 AM  Result Value Ref Range   Glucose-Capillary 286 (H) 65 - 99 mg/dL   Comment 1 Notify RN   Glucose, capillary     Status: Abnormal   Collection Time: 06/24/15  3:35 AM  Result Value Ref Range   Glucose-Capillary 288 (H) 65 - 99 mg/dL  Comprehensive metabolic panel     Status: Abnormal   Collection Time: 06/24/15  4:00 AM  Result Value Ref Range   Sodium 136 135 - 145 mmol/L   Potassium 4.3 3.5 - 5.1 mmol/L   Chloride 108 101 - 111 mmol/L   CO2 20 (L) 22 - 32 mmol/L   Glucose, Bld 321 (H) 65 - 99 mg/dL   BUN 32 (H) 6 - 20 mg/dL   Creatinine, Ser 1.03 0.61 - 1.24 mg/dL   Calcium 6.7 (L) 8.9 - 10.3 mg/dL   Total Protein 5.4 (L) 6.5 - 8.1  g/dL   Albumin 1.6 (L) 3.5 - 5.0 g/dL   AST 61 (H) 15 - 41 U/L   ALT 20 17 - 63 U/L   Alkaline Phosphatase 655 (H) 38 - 126 U/L   Total Bilirubin 0.5 0.3 - 1.2 mg/dL   GFR calc non Af Amer >60 >60 mL/min   GFR calc Af Amer >60 >60 mL/min    Comment: (NOTE) The eGFR has been calculated using the CKD EPI equation. This calculation has not been validated in all clinical situations. eGFR's persistently <60 mL/min signify possible Chronic Kidney Disease.    Anion gap 8 5 - 15  Prealbumin     Status: Abnormal   Collection Time: 06/24/15  4:00 AM  Result Value Ref Range   Prealbumin 4.8 (L) 18 - 38 mg/dL  Comment: Performed at [facility]  Magnesium     Status: None   Collection Time: 06/24/15  4:00 AM  Result Value Ref Range   Magnesium 2.3 1.7 - 2.4 mg/dL  Phosphorus     Status: None   Collection Time: 06/24/15  4:00 AM  Result Value Ref Range   Phosphorus 2.7 2.5 - 4.6 mg/dL  Triglycerides     Status: Abnormal   Collection Time: 06/24/15  4:00 AM  Result Value Ref Range   Triglycerides 178 (H) <150 mg/dL    Comment: Performed at [facility]  CBC     Status: Abnormal   Collection Time: 06/24/15  4:00 AM  Result Value Ref Range   WBC 12.5 (H) 4.0 - 10.5 K/uL   RBC 2.59 (L) 4.22 - 5.81 MIL/uL   Hemoglobin 7.5 (L) 13.0 - 17.0 g/dL   HCT 23.0 (L) 39.0 - 52.0 %   MCV 88.8 78.0 - 100.0 fL   MCH 29.0 26.0 - 34.0 pg   MCHC 32.6 30.0 - 36.0 g/dL   RDW 16.4 (H) 11.5 - 15.5 %   Platelets 224 150 - 400 K/uL  Differential     Status: Abnormal   Collection Time: 06/24/15  4:00 AM  Result Value Ref Range   Neutrophils Relative % 85 %   Lymphocytes Relative 8 %   Monocytes Relative 6 %   Eosinophils Relative 1 %   Basophils Relative 0 %   Neutro Abs 10.6 (H) 1.7 - 7.7 K/uL   Lymphs Abs 1.0 0.7 - 4.0 K/uL   Monocytes Absolute 0.8 0.1 - 1.0 K/uL   Eosinophils Absolute 0.1 0.0 - 0.7 K/uL   Basophils Absolute 0.0 0.0 - 0.1 K/uL   WBC Morphology MILD LEFT SHIFT (1-5%  METAS, OCC MYELO, OCC BANDS)   Procalcitonin - Baseline     Status: None   Collection Time: 06/24/15  4:00 AM  Result Value Ref Range   Procalcitonin 1.75 ng/mL    Comment:        Interpretation: PCT > 0.5 ng/mL and <= 2 ng/mL: Systemic infection (sepsis) is possible, but other conditions are known to elevate PCT as well. (NOTE)         ICU PCT Algorithm               Non ICU PCT Algorithm    ----------------------------     ------------------------------         PCT < 0.25 ng/mL                 PCT < 0.1 ng/mL     Stopping of antibiotics            Stopping of antibiotics       strongly encouraged.               strongly encouraged.    ----------------------------     ------------------------------       PCT level decrease by               PCT < 0.25 ng/mL       >= 80% from peak PCT       OR PCT 0.25 - 0.5 ng/mL          Stopping of antibiotics  encouraged.     Stopping of antibiotics           encouraged.    ----------------------------     ------------------------------       PCT level decrease by              PCT >= 0.25 ng/mL       < 80% from peak PCT        AND PCT >= 0.5 ng/mL             Continuing antibiotics                                              encouraged.       Continuing antibiotics            encouraged.    ----------------------------     ------------------------------     PCT level increase compared          PCT > 0.5 ng/mL         with peak PCT AND          PCT >= 0.5 ng/mL             Escalation of antibiotics                                          strongly encouraged.      Escalation of antibiotics        strongly encouraged.   Glucose, capillary     Status: Abnormal   Collection Time: 06/24/15  7:33 AM  Result Value Ref Range   Glucose-Capillary 355 (H) 65 - 99 mg/dL   Comment 1 Notify RN    Comment 2 Document in Chart   Glucose, capillary     Status: Abnormal   Collection Time: 06/24/15 12:05 PM   Result Value Ref Range   Glucose-Capillary 341 (H) 65 - 99 mg/dL  Glucose, capillary     Status: Abnormal   Collection Time: 06/24/15  4:09 PM  Result Value Ref Range   Glucose-Capillary 271 (H) 65 - 99 mg/dL   Comment 1 Notify RN    Comment 2 Document in Chart   Glucose, capillary     Status: Abnormal   Collection Time: 06/24/15  8:02 PM  Result Value Ref Range   Glucose-Capillary 267 (H) 65 - 99 mg/dL  Glucose, capillary     Status: Abnormal   Collection Time: 06/24/15 11:39 PM  Result Value Ref Range   Glucose-Capillary 200 (H) 65 - 99 mg/dL   Comment 1 Notify RN   Glucose, capillary     Status: Abnormal   Collection Time: 06/25/15  3:42 AM  Result Value Ref Range   Glucose-Capillary 194 (H) 65 - 99 mg/dL  Procalcitonin     Status: None   Collection Time: 06/25/15  5:30 AM  Result Value Ref Range   Procalcitonin 1.31 ng/mL    Comment:        Interpretation: PCT > 0.5 ng/mL and <= 2 ng/mL: Systemic infection (sepsis) is possible, but other conditions are known to elevate PCT as well. (NOTE)         ICU PCT Algorithm               Non ICU  PCT Algorithm    ----------------------------     ------------------------------         PCT < 0.25 ng/mL                 PCT < 0.1 ng/mL     Stopping of antibiotics            Stopping of antibiotics       strongly encouraged.               strongly encouraged.    ----------------------------     ------------------------------       PCT level decrease by               PCT < 0.25 ng/mL       >= 80% from peak PCT       OR PCT 0.25 - 0.5 ng/mL          Stopping of antibiotics                                             encouraged.     Stopping of antibiotics           encouraged.    ----------------------------     ------------------------------       PCT level decrease by              PCT >= 0.25 ng/mL       < 80% from peak PCT        AND PCT >= 0.5 ng/mL             Continuing antibiotics                                               encouraged.       Continuing antibiotics            encouraged.    ----------------------------     ------------------------------     PCT level increase compared          PCT > 0.5 ng/mL         with peak PCT AND          PCT >= 0.5 ng/mL             Escalation of antibiotics                                          strongly encouraged.      Escalation of antibiotics        strongly encouraged.   Basic metabolic panel     Status: Abnormal   Collection Time: 06/25/15  5:30 AM  Result Value Ref Range   Sodium 141 135 - 145 mmol/L   Potassium 3.7 3.5 - 5.1 mmol/L   Chloride 107 101 - 111 mmol/L   CO2 21 (L) 22 - 32 mmol/L   Glucose, Bld 185 (H) 65 - 99 mg/dL   BUN 30 (H) 6 - 20 mg/dL   Creatinine, Ser 1.07 0.61 - 1.24 mg/dL   Calcium 6.9 (L) 8.9 - 10.3 mg/dL   GFR calc non Af Amer >60 >60 mL/min   GFR  calc Af Amer >60 >60 mL/min    Comment: (NOTE) The eGFR has been calculated using the CKD EPI equation. This calculation has not been validated in all clinical situations. eGFR's persistently <60 mL/min signify possible Chronic Kidney Disease.    Anion gap 13 5 - 15    Imaging / Studies: Dg Chest Port 1 View  06/25/2015  CLINICAL DATA:  Respiratory failure. EXAM: PORTABLE CHEST 1 VIEW COMPARISON:  06/24/2015. FINDINGS: Interim extubation and removal of NG tube . Right PICC line, left IJ line stable position. Cardiomegaly. Persistent interstitial prominence, interstitial edema cannot be excluded. Persistent low lung volumes with right mid lung field and left base atelectasis and/or infiltrates. No pleural effusion or pneumothorax. IMPRESSION: 1. Interim extubation removal of NG tube. Left IJ line and right PICC line stable position. 2. Persistent cardiomegaly pulmonary interstitial prominence suggesting interstitial edema. No interim change. 3. Persistent low lung volumes with right mid lung and left base subsegmental atelectasis. Electronically Signed   By: McCarr   On:  06/25/2015 07:18   Dg Chest Port 1 View  06/24/2015  CLINICAL DATA:  Respiratory failure. EXAM: PORTABLE CHEST 1 VIEW COMPARISON:  06/23/2015. FINDINGS: Endotracheal tube, NG tube, left IJ line, right PICC line in stable position. Mediastinum hilar structures are stable. Cardiomegaly with diffuse bilateral pulmonary interstitial prominence consistent with congestive heart failure. Pneumonitis cannot be excluded. Persistent right mid lung and left base subsegmental atelectasis. No prominent pleural effusion. No pneumothorax. IMPRESSION: 1. Lines and tubes in stable position. 2. Persistent cardiomegaly. Interim appearance of bilateral from interstitial prominence. Findings suggest congestive heart failure. 3. Persistent low lung volumes with persistent right mid lung and left base subsegmental atelectasis. Electronically Signed   By: [name]  Register   On: 06/24/2015 07:18   Dg Chest Port 1 View  06/23/2015  CLINICAL DATA:  Central line placement EXAM: PORTABLE CHEST 1 VIEW COMPARISON:  06/23/2015 FINDINGS: Left internal jugular central line has been placed with the tip in the upper SVC. No pneumothorax. Endotracheal tube is 3 cm above the carina. NG tube is in the stomach. Right PICC line is unchanged. Patchy right upper lobe and left lower lobe airspace opacities are again noted, unchanged. Low lung volumes. IMPRESSION: Left central line placement with the tip in the upper SVC. No pneumothorax. Otherwise no significant change. Electronically Signed   By: Rolm Baptise M.D.   On: 06/23/2015 17:15   Dg Chest Port 1 View  06/23/2015  CLINICAL DATA:  ET tube repositioning EXAM: PORTABLE CHEST 1 VIEW COMPARISON:  06/23/2015 FINDINGS: Endotracheal tube has been retracted and is approximately 2 cm above the carina. Right PICC line is unchanged as is the NG tube. There is mild cardiomegaly. Left lower lobe atelectasis or infiltrate noted. Patchy right upper lobe opacity also noted. These are unchanged since prior  study. IMPRESSION: Interval retraction of the endotracheal tube, now approximately 2 cm above the carina. Stable patchy right upper lobe and left lower lobe airspace opacities. Electronically Signed   By: Rolm Baptise M.D.   On: 06/23/2015 16:29   Dg Chest Port 1 View  06/23/2015  CLINICAL DATA:  Status post endotracheal intubation EXAM: PORTABLE CHEST - 1 VIEW COMPARISON:  06/23/2015 FINDINGS: Cardiac shadow is stable. Some mild atelectatic changes remain in the left lung base and right mid lung. Nasogastric catheter is been placed but is coiled at the level of the diaphragm in may lie within a hiatal hernia. A right-sided PICC line is noted at the cavoatrial  junction. An endotracheal tube is seen at the level of the carina directing slightly into the right mainstem bronchus. This should be withdrawn approximately 4 cm. No other focal abnormality is noted. IMPRESSION: Tubes and lines as described above. The endotracheal tube should be withdrawn 4 cm. Bilateral patchy atelectatic changes. These results were called by telephone at the time of interpretation on 06/23/2015 at 2:14 pm to [facility], the patients nurse Who verbally acknowledged these results. Electronically Signed   By: Inez Catalina M.D.   On: 06/23/2015 14:19    Medications / Allergies:  Scheduled Meds: . acetaminophen  1,000 mg Oral TID  . diltiazem  60 mg Oral 4 times per day  . diltiazem  360 mg Oral Daily  . famotidine (PEPCID) IV  20 mg Intravenous Q24H  . furosemide  40 mg Intravenous Q12H  . heparin subcutaneous  5,000 Units Subcutaneous 3 times per day  . insulin aspart  0-20 Units Subcutaneous 6 times per day  . insulin glargine  16 Units Subcutaneous QHS  . ipratropium-albuterol  3 mL Nebulization Q4H  . lip balm  1 application Topical BID  . methylPREDNISolone (SOLU-MEDROL) injection  60 mg Intravenous Q12H  . metoprolol tartrate  12.5 mg Oral BID  . pantoprazole (PROTONIX) IV  40 mg Intravenous QHS  . piperacillin-tazobactam  (ZOSYN)  IV  3.375 g Intravenous Q8H  . prednisoLONE acetate  1 drop Both Eyes BID  . saccharomyces boulardii  250 mg Oral BID  . vancomycin  1,000 mg Intravenous Q12H   Continuous Infusions:  PRN Meds:.bisacodyl, diphenhydrAMINE **OR** diphenhydrAMINE, magic mouthwash, methocarbamol (ROBAXIN)  IV, midazolam, morphine injection, ondansetron **OR** ondansetron (ZOFRAN) IV, oxyCODONE, sodium chloride  Antibiotics: Anti-infectives    Start     Dose/Rate Route Frequency Ordered Stop   06/23/15 1700  vancomycin (VANCOCIN) IVPB 1000 mg/200 mL premix     1,000 mg 200 mL/hr over 60 Minutes Intravenous Every 12 hours 06/23/15 1414     06/23/15 1430  piperacillin-tazobactam (ZOSYN) IVPB 3.375 g     3.375 g 12.5 mL/hr over 240 Minutes Intravenous Every 8 hours 06/23/15 1416     06/23/15 1415  piperacillin-tazo (ZOSYN) NICU IV syringe 200 mg/mL  Status:  Discontinued     75 mg/kg  105.4 kg 79 mL/hr over 30 Minutes Intravenous Every 8 hours 06/23/15 1401 06/23/15 1416   06/23/15 1000  levofloxacin (LEVAQUIN) tablet 750 mg  Status:  Discontinued     750 mg Oral Daily 06/23/15 0750 06/23/15 1401   06/22/15 1600  vancomycin (VANCOCIN) IVPB 1000 mg/200 mL premix  Status:  Discontinued     1,000 mg 200 mL/hr over 60 Minutes Intravenous Every 12 hours 06/22/15 1346 06/23/15 0740   06/20/15 0200  vancomycin (VANCOCIN) 1,250 mg in sodium chloride 0.9 % 250 mL IVPB  Status:  Discontinued     1,250 mg 166.7 mL/hr over 90 Minutes Intravenous Every 12 hours 06/19/15 1314 06/22/15 1346   06/19/15 1400  vancomycin (VANCOCIN) 2,000 mg in sodium chloride 0.9 % 500 mL IVPB     2,000 mg 250 mL/hr over 120 Minutes Intravenous  Once 06/19/15 1313 06/19/15 1737   06/19/15 1315  piperacillin-tazobactam (ZOSYN) IVPB 3.375 g  Status:  Discontinued     3.375 g 12.5 mL/hr over 240 Minutes Intravenous Every 8 hours 06/19/15 1305 06/23/15 0740   06/19/15 1100  cefTRIAXone (ROCEPHIN) 2 g in dextrose 5 % 50 mL IVPB  Status:   Discontinued     2 g 100  mL/hr over 30 Minutes Intravenous Every 24 hours 06/19/15 1020 06/19/15 1246   06/12/15 0930  cefoTEtan (CEFOTAN) 2 g in dextrose 5 % 50 mL IVPB     2 g 100 mL/hr over 30 Minutes Intravenous On call to O.R. 06/12/15 0852 06/12/15 1359        Assessment/Plan POD#13 exlap with SBR for strangulated bowel and internal hernia---Dr. [name] 06/12/15 -start clears -PT eval, IS(pulling 108m) -will remove staples tomorrow and apply steri strips Acute respiratory failure-self extubated, doing well.  Continue ICU today.  Solu-medrol per CCM, appreciate assistance.  ID-on Vanc and zosyn 12/9---->  For PNA/sepsis DM II-CBGs, SSI CV-PEA arrest 167/01  ST, diastolic heart failure.  Diuresing, Appreciate cards assistance.  Please clarify 2 cardizem doses Stage 4 prostate cancer FEN-advance diet, add PO pain meds and wean IV VTE prophylaxis-SCD/heparin. Dopplers 12/9 negative for DVT Dispo-ICU   EErby Pian ANP-BC [facility]   06/25/2015 7:43 AM

## 2015-06-25 NOTE — Progress Notes (Signed)
Patient Name: Jeremy Jennings Date of Encounter: 06/25/2015  Principal Problem:   SBO (small bowel obstruction) ischemic s/p LOA & SB resection 06/12/2015 Active Problems:   Hyperlipidemia   Hypertension   Prostate cancer ([place])   Anemia   HCAP (healthcare-associated pneumonia)   Sinus tachycardia (HCC)   Metabolic acidosis   Type 2 diabetes mellitus ([place])   Enterococcus UTI   Hypertensive cardiovascular disease-EF 75%, LVH   Dyspnea-diastolic CHF   Acute respiratory failure ([place])   Cardiac arrest due to respiratory disorder [facility])   Primary Cardiologist: Dr Mare Ferrari  Patient Profile: 79 yo male w/ hx LVH, PACs, HTN, DM, HL and prostate CA. Admitted 12/01 w/ SBO, then ?PNA, cards saw 12/10 for tachycardia, felt ST w/ intermittent ectopy, 12/13 brady arrest 2nd SOB/mucus plugging  SUBJECTIVE: No chest pain or palpitations overnight  OBJECTIVE Filed Vitals:   06/25/15 0700 06/25/15 0800 06/25/15 0859 06/25/15 0934  BP: 166/75   155/73  Pulse: 109     Temp:  97.6 F (36.4 C)    TempSrc:  Oral    Resp: 16     Height:      Weight:      SpO2: 100%  96%     Intake/Output Summary (Last 24 hours) at 06/25/15 0935 Last data filed at 06/25/15 0700  Gross per 24 hour  Intake 1741.04 ml  Output   2685 ml  Net -943.96 ml   Filed Weights - WEIGHT 220 lbs on admit   06/24/15 0500 06/24/15 0506 06/25/15 0500  Weight: 229 lb 4.5 oz (104 kg) 229 lb 4.5 oz (104 kg) 229 lb 15 oz (104.3 kg)    PHYSICAL EXAM General: Well developed, well nourished, male in no acute distress. Head: Normocephalic, atraumatic.  Neck: Supple without bruits, JVD difficult to assess 2nd habitus and equipment. Lungs:  Resp regular and unlabored, decreased BS bases w/ few rales. Heart: RRR, S1, S2, no S3, S4, soft murmur; no rub. Abdomen: Soft, tender, distended, BS decreased Extremities: No clubbing, cyanosis, 2+ edema.  Neuro: Alert and oriented X 3. Moves all extremities spontaneously. Psych:  Normal affect.  LABS: CBC: Recent Labs  06/23/15 1311 06/24/15 0400  WBC 10.9* 12.5*  NEUTROABS  --  10.6*  HGB 8.0* 7.5*  HCT 25.5* 23.0*  MCV 91.7 88.8  PLT 250 224   INR: Recent Labs  06/23/15 1311  INR [id number]   Basic Metabolic Panel: Recent Labs  06/23/15 0450  06/24/15 0400 06/25/15 0530  NA 134*  < > 136 141  K 3.3*  < > 4.3 3.7  CL 107  < > 108 107  CO2 20*  < > 20* 21*  GLUCOSE 203*  < > 321* 185*  BUN 24*  < > 32* 30*  CREATININE 0.93  < > 1.03 1.07  CALCIUM 6.5*  < > 6.7* 6.9*  MG 1.9  --  2.3  --   PHOS 2.5  --  2.7  --   < > = values in this interval not displayed. Liver Function Tests: Recent Labs  06/24/15 0400  AST 61*  ALT 20  ALKPHOS 655*  BILITOT 0.5  PROT 5.4*  ALBUMIN 1.6*   Cardiac Enzymes: Recent Labs  06/23/15 1311  TROPONINI <0.03   BNP:  B NATRIURETIC PEPTIDE  Date/Time Value Ref Range Status  06/23/2015 05:30 AM 147.8* 0.0 - 100.0 pg/mL Final  06/19/2015 11:20 AM 258.8* 0.0 - 100.0 pg/mL Final   Fasting Lipid Panel: Recent  Labs  06/24/15 0400  TRIG 178*    TELE:  SR, ST, rare 3 bt runs VT       Radiology/Studies: Dg Chest Port 1 View 06/25/2015  CLINICAL DATA:  Respiratory failure. EXAM: PORTABLE CHEST 1 VIEW COMPARISON:  06/24/2015. FINDINGS: Interim extubation and removal of NG tube . Right PICC line, left IJ line stable position. Cardiomegaly. Persistent interstitial prominence, interstitial edema cannot be excluded. Persistent low lung volumes with right mid lung field and left base atelectasis and/or infiltrates. No pleural effusion or pneumothorax. IMPRESSION: 1. Interim extubation removal of NG tube. Left IJ line and right PICC line stable position. 2. Persistent cardiomegaly pulmonary interstitial prominence suggesting interstitial edema. No interim change. 3. Persistent low lung volumes with right mid lung and left base subsegmental atelectasis. Electronically Signed   By: [place]   On: 06/25/2015 07:18    Dg Chest Port 1 View 06/24/2015  CLINICAL DATA:  Respiratory failure. EXAM: PORTABLE CHEST 1 VIEW COMPARISON:  06/23/2015. FINDINGS: Endotracheal tube, NG tube, left IJ line, right PICC line in stable position. Mediastinum hilar structures are stable. Cardiomegaly with diffuse bilateral pulmonary interstitial prominence consistent with congestive heart failure. Pneumonitis cannot be excluded. Persistent right mid lung and left base subsegmental atelectasis. No prominent pleural effusion. No pneumothorax. IMPRESSION: 1. Lines and tubes in stable position. 2. Persistent cardiomegaly. Interim appearance of bilateral from interstitial prominence. Findings suggest congestive heart failure. 3. Persistent low lung volumes with persistent right mid lung and left base subsegmental atelectasis. Electronically Signed   By: [name]  Register   On: 06/24/2015 07:18   Dg Chest Port 1 View 06/23/2015  CLINICAL DATA:  Central line placement EXAM: PORTABLE CHEST 1 VIEW COMPARISON:  06/23/2015 FINDINGS: Left internal jugular central line has been placed with the tip in the upper SVC. No pneumothorax. Endotracheal tube is 3 cm above the carina. NG tube is in the stomach. Right PICC line is unchanged. Patchy right upper lobe and left lower lobe airspace opacities are again noted, unchanged. Low lung volumes. IMPRESSION: Left central line placement with the tip in the upper SVC. No pneumothorax. Otherwise no significant change. Electronically Signed   By: Rolm Baptise M.D.   On: 06/23/2015 17:15   Dg Chest Port 1 View 06/23/2015  CLINICAL DATA:  ET tube repositioning EXAM: PORTABLE CHEST 1 VIEW COMPARISON:  06/23/2015 FINDINGS: Endotracheal tube has been retracted and is approximately 2 cm above the carina. Right PICC line is unchanged as is the NG tube. There is mild cardiomegaly. Left lower lobe atelectasis or infiltrate noted. Patchy right upper lobe opacity also noted. These are unchanged since prior study. IMPRESSION:  Interval retraction of the endotracheal tube, now approximately 2 cm above the carina. Stable patchy right upper lobe and left lower lobe airspace opacities. Electronically Signed   By: Rolm Baptise M.D.   On: 06/23/2015 16:29   Dg Chest Port 1 View 06/23/2015  CLINICAL DATA:  Status post endotracheal intubation EXAM: PORTABLE CHEST - 1 VIEW COMPARISON:  06/23/2015 FINDINGS: Cardiac shadow is stable. Some mild atelectatic changes remain in the left lung base and right mid lung. Nasogastric catheter is been placed but is coiled at the level of the diaphragm in may lie within a hiatal hernia. A right-sided PICC line is noted at the cavoatrial junction. An endotracheal tube is seen at the level of the carina directing slightly into the right mainstem bronchus. This should be withdrawn approximately 4 cm. No other focal abnormality is noted. IMPRESSION: Tubes  and lines as described above. The endotracheal tube should be withdrawn 4 cm. Bilateral patchy atelectatic changes. These results were called by telephone at the time of interpretation on 06/23/2015 at 2:14 pm to [facility], the patients nurse Who verbally acknowledged these results. Electronically Signed   By: Inez Catalina M.D.   On: 06/23/2015 14:19     Current Medications:  . acetaminophen  1,000 mg Oral TID  . diltiazem  360 mg Oral Daily  . famotidine (PEPCID) IV  20 mg Intravenous Q24H  . furosemide  40 mg Intravenous Q12H  . heparin subcutaneous  5,000 Units Subcutaneous 3 times per day  . insulin aspart  0-20 Units Subcutaneous 6 times per day  . insulin glargine  16 Units Subcutaneous QHS  . ipratropium-albuterol  3 mL Nebulization Q4H  . lip balm  1 application Topical BID  . methylPREDNISolone (SOLU-MEDROL) injection  60 mg Intravenous Q12H  . metoprolol tartrate  12.5 mg Oral BID  . pantoprazole (PROTONIX) IV  40 mg Intravenous QHS  . piperacillin-tazobactam (ZOSYN)  IV  3.375 g Intravenous Q8H  . prednisoLONE acetate  1 drop Both Eyes  BID  . saccharomyces boulardii  250 mg Oral BID  . vancomycin  1,000 mg Intravenous Q12H      ASSESSMENT AND PLAN: Principal Problem:   SBO (small bowel obstruction) ischemic s/p LOA & SB resection 06/12/2015 - per [facility]    Abnormal CXR - likely ARDS in setting respitory arrest and traumatic intubation - however, wt up 9 lbs since admit and 2+ LE edema - continue Lasix 40 mg IV BID - I/O neg by 1.1 L 12/14 - I/O net + 9.8 L since admit - may need to increase dose, hold for now    Sinus tachycardia ([place]) - back on oral BB - dose is much lower than what he was getting IV - 2 doses held in 48 hours, unclear reasons, will change back to IV for now - increase PO rx, he was on IV Lopressor 10 mg tid.  - will order PRN IV Lopressor as well.  Otherwise, per [facility] Active Problems:   Hyperlipidemia   Hypertension   Prostate cancer ([place])   Anemia   HCAP (healthcare-associated pneumonia)   Metabolic acidosis   Type 2 diabetes mellitus ([place])   Enterococcus UTI   Hypertensive cardiovascular disease-EF 75%, LVH   Dyspnea-diastolic CHF   Acute respiratory failure ([place])   Cardiac arrest due to respiratory disorder ([place])   Signed, Barrett, Rhonda , PA-C 9:35 AM 06/25/2015  Patient examined chart reviewed  Discussed care with patient and daughter.  Lungs much less wheezing Don't think oral beta blocker had anything to do with decompensation but will resume Iv beta blocker to  Control HR  Continue bid iv lasix weight no change but I/O's negative.  Troponin negative no signs of  Acute ischemic event.  Taking PO staples dry and no signs of acute abdomen  Jenkins Rouge

## 2015-06-25 NOTE — Progress Notes (Signed)
eLink Physician-Brief Progress Note Patient Name: Jeremy Jennings DOB: June 18, 1936 MRN: [id number]   Date of Service  06/25/2015  HPI/Events of Note  Rn concerneda bout HR 110 and MAP 97 with sbp 160. Patient has mild hypoactive delirium. Also, s/p plap but NPO stautus being lifted.   On camera - looks stable  eICU Interventions  Restart home cardizem Haldol 2mg  IV x 1 (QTc 06/24/15 < 524msec)     Intervention Category Intermediate Interventions: Arrhythmia - evaluation and management  Rodricus Candelaria 06/25/2015, 12:59 AM

## 2015-06-25 NOTE — Progress Notes (Signed)
Wasted 10ml of fentanyl (10 mcg/ml) with Yehuda Budd, RN  Chana Bode, RN

## 2015-06-25 NOTE — Progress Notes (Signed)
Nutrition Follow-up  INTERVENTION:   Provide Juven BID, each packet provides 80 kcal and 14 g protein. Encourage PO intake RD to continue to monitor for further diet advancement and PO intake  NUTRITION DIAGNOSIS:   Inadequate oral intake related to inability to eat as evidenced by other (see comment) (clear liquid diet).  Ongoing.  GOAL:   Patient will meet greater than or equal to 90% of their needs  Progressing with diet advancement  MONITOR:   PO intake, Supplement acceptance, Diet advancement, Labs, Weight trends, Skin, I & O's  ASSESSMENT:   79 yo black male who has a history of stage 4 prostate cancer, HTN, DM, tachycardia, recent GI bleed. He had an endoscopy at that time of admission but apparently deferred an inpatient colonoscopy. He just had his colonoscopy done by Dr. Watt Climes on Tuesday. By report, this was normal with no source of bleeding identified. He denies any further bloody BMs sin79 yo black male who has a history of stage 4 prostate cancer, HTN, DM, tachycardia, recent GI bleed. He had an endoscopy at that time of admission but apparently deferred an inpatient colonoscopy. He just had his colonoscopy done by Dr. Watt Climes on Tuesday. By report, this was normal with no source of bleeding identified. He denies any further bloody BMs since this admission. This morning around 6 or 7 am, the patient began having centralized crampy abdominal pain. He denies nausea or vomiting, but admits to no flatus. He deals with constipation on a regular basis. Due to persistent pain, the patient presented to [facility] for further evaluation.ce this admission. This morning around 6 or 7 am, the patient began having centralized crampy abdominal pain. He denies nausea or vomiting, but admits to no flatus. He deals with constipation on a regular basis. Due to persistent pain, the patient presented to [facility] for further evaluation.  RD was consulted for initiation of tube feeding but pt  later self extubated. TPN was stopped yesterday after extubation. Pt remains off vent and was started on clear liquids. Pt reports doing well on clears. He states he does feel hungry. Pt is willing to try a clear liquid supplement. Initially wanted to order Boost Breeze for patient but d/t elevated CBGs will order Juven BID instead.   Labs reviewed: CBGs: 194-275 Elevated BUN  Diet Order:  Diet clear liquid Room service appropriate?: Yes; Fluid consistency:: Thin  Skin:  Wound (see comment) (Abdominal incision)  Last BM:  12/13  Height:   Ht Readings from Last 1 Encounters:  06/23/15 6' (1.829 m)    Weight:   Wt Readings from Last 1 Encounters:  06/25/15 229 lb 15 oz (104.3 kg)    Ideal Body Weight:  80.91 kg  BMI:  Body mass index is 31.18 kg/(m^2).  Estimated Nutritional Needs:   Kcal:  2000-2200  Protein:  100-110g  Fluid:  2L/day  EDUCATION NEEDS:   No education needs identified at this time  Clayton Bibles, MS, RD, LDN Pager: [phone] After Hours Pager: [phone]

## 2015-06-25 NOTE — Progress Notes (Addendum)
Patient reported seeing leaves rustling on the ground in room and seeing a person talking and trying to remove his TV off of the wall. Patient reoriented, stimulation decreased. Sallye Ober NP notified.

## 2015-06-26 LAB — BASIC METABOLIC PANEL
ANION GAP: 12 (ref 5–15)
BUN: 38 mg/dL — ABNORMAL HIGH (ref 6–20)
CO2: 21 mmol/L — ABNORMAL LOW (ref 22–32)
Calcium: 7 mg/dL — ABNORMAL LOW (ref 8.9–10.3)
Chloride: 107 mmol/L (ref 101–111)
Creatinine, Ser: 1.25 mg/dL — ABNORMAL HIGH (ref 0.61–1.24)
GFR, EST NON AFRICAN AMERICAN: 54 mL/min — AB (ref >60)
Glucose, Bld: 175 mg/dL — ABNORMAL HIGH (ref 65–99)
POTASSIUM: 4 mmol/L (ref 3.5–5.1)
SODIUM: 140 mmol/L (ref 135–145)

## 2015-06-26 LAB — GLUCOSE, CAPILLARY
GLUCOSE-CAPILLARY: 261 mg/dL — AB (ref 65–99)
GLUCOSE-CAPILLARY: 194 mg/dL — AB (ref 65–99)
GLUCOSE-CAPILLARY: 211 mg/dL — AB (ref 65–99)
Glucose-Capillary: 170 mg/dL — ABNORMAL HIGH (ref 65–99)
Glucose-Capillary: 166 mg/dL — ABNORMAL HIGH (ref 65–99)

## 2015-06-26 LAB — CBC
HCT: 25.1 % — ABNORMAL LOW (ref 39.0–52.0)
Hemoglobin: 8.3 g/dL — ABNORMAL LOW (ref 13.0–17.0)
MCH: 29.1 pg (ref 26.0–34.0)
MCHC: 33.1 g/dL (ref 30.0–36.0)
MCV: 88.1 fL (ref 78.0–100.0)
Platelets: 383 10*3/uL (ref 150–400)
RBC: 2.85 MIL/uL — ABNORMAL LOW (ref 4.22–5.81)
RDW: 16.4 % — ABNORMAL HIGH (ref 11.5–15.5)
WBC: 17.4 10*3/uL — ABNORMAL HIGH (ref 4.0–10.5)

## 2015-06-26 LAB — CULTURE, RESPIRATORY W GRAM STAIN

## 2015-06-26 LAB — PROCALCITONIN: Procalcitonin: 0.83 ng/mL

## 2015-06-26 LAB — CULTURE, RESPIRATORY: CULTURE: NO GROWTH

## 2015-06-26 MED ORDER — DEXTROSE-NACL 5-0.9 % IV SOLN
INTRAVENOUS | Status: AC
  Administered 2015-06-26 – 2015-06-27 (×2): via INTRAVENOUS

## 2015-06-26 MED ORDER — PREDNISONE 20 MG PO TABS: 40.0000 mg | ORAL_TABLET | Freq: Every day | ORAL | Status: DC

## 2015-06-26 MED ORDER — INSULIN ASPART 100 UNIT/ML ~~LOC~~ SOLN
0.0000 [IU] | Freq: Three times a day (TID) | SUBCUTANEOUS | Status: DC
Start: 2015-06-26 — End: 2015-06-27
  Administered 2015-06-26: 4 [IU] via SUBCUTANEOUS
  Administered 2015-06-26: 11 [IU] via SUBCUTANEOUS
  Administered 2015-06-27: 7 [IU] via SUBCUTANEOUS

## 2015-06-26 MED ORDER — PANTOPRAZOLE SODIUM 40 MG PO TBEC: 40.0000 mg | DELAYED_RELEASE_TABLET | Freq: Every day | ORAL | Status: DC

## 2015-06-26 MED ORDER — CETYLPYRIDINIUM CHLORIDE 0.05 % MT LIQD
7.0000 mL | Freq: Two times a day (BID) | OROMUCOSAL | Status: DC
  Administered 2015-06-27 – 2015-06-28 (×5): 7 mL via OROMUCOSAL

## 2015-06-26 MED ORDER — PREDNISONE 20 MG PO TABS: 30.0000 mg | ORAL_TABLET | Freq: Every day | ORAL | Status: DC

## 2015-06-26 MED ORDER — PREDNISONE 20 MG PO TABS: 60.0000 mg | ORAL_TABLET | Freq: Every day | ORAL | Status: DC

## 2015-06-26 MED ORDER — PREDNISONE 20 MG PO TABS: 50.0000 mg | ORAL_TABLET | Freq: Every day | ORAL | Status: DC

## 2015-06-26 MED ORDER — PREDNISONE 10 MG PO TABS: 10.0000 mg | ORAL_TABLET | Freq: Every day | ORAL | Status: DC

## 2015-06-26 MED ORDER — PREDNISONE 20 MG PO TABS: 20.0000 mg | ORAL_TABLET | Freq: Every day | ORAL | Status: DC

## 2015-06-26 NOTE — Progress Notes (Signed)
Pharmacy Antibiotic Follow-up Note  Jeremy Jennings is a 79 y.o. year-old male admitted on 06/11/2015.  The patient is currently on day 5 of vancomycin and zosyn for pneumonia.  These were de-escalated earlier today to [facility] for enterococcus UTI, however patient is now s/p respiratory arrest and intubation. New CXR concerning for LL PNA.  Pharmacy asked to re-dose [place] for HCAP.   Assessment/Plan:  -stop vancomycin per MD  -continue Zosyn 3.375g IV Q8H infused over 4hrs  End date for zosyn 12/19 per MD   Temp (24hrs), Avg:97.9 F (36.6 C), Min:97.5 F (36.4 C), Max:98.1 F (36.7 C)   Recent Labs Lab 06/21/15 0450 06/22/15 0515 06/23/15 1311 06/24/15 0400 06/26/15 0500  WBC 10.0 10.2 10.9* 12.5* 17.4*     Recent Labs Lab 06/23/15 1311 06/23/15 2300 06/24/15 0400 06/25/15 0530 06/26/15 0500  CREATININE 1.08 1.04 1.03 1.07 1.25*   Estimated Creatinine Clearance: 61 mL/min (by C-G formula based on Cr of 1.25).    No Known Allergies  Antimicrobials this admission: 12/9 >> vanc >>  12/16 12/9 >> ZEI >>  Levels/dose changes this admission: 12/12 1300 VT 23  Microbiology results: 12/8 blood x2: NGTD  12/9 urine: Enterococcus, pan sens  MRSA PCR neg  Thank you for allowing pharmacy to be a part of this patient's care.  Dolly Rias RPh 06/26/2015, 11:35 AM Pager [phone]

## 2015-06-26 NOTE — Progress Notes (Signed)
PT Cancellation Note  Patient Details Name: Jeremy Jennings MRN: [id number] DOB: 1936-02-09   Cancelled Treatment:    Reason Eval/Treat Not Completed: Fatigue/lethargy limiting ability to participate (nauseated, declines mobility at this time)   [name],KATHrine E 06/26/2015, 2:17 PM [name], PT, DPT 06/26/2015 Pager: [phone]

## 2015-06-26 NOTE — Progress Notes (Signed)
Patient Name: Jeremy Jennings Date of Encounter: 06/26/2015  Principal Problem:   SBO (small bowel obstruction) ischemic s/p LOA & SB resection 06/12/2015 Active Problems:   Hyperlipidemia   Hypertension   Prostate cancer ([place])   Anemia   HCAP (healthcare-associated pneumonia)   Sinus tachycardia (HCC)   Metabolic acidosis   Type 2 diabetes mellitus (HCC)   Enterococcus UTI   Hypertensive cardiovascular disease-EF 75%, LVH   Acute diastolic CHF (congestive heart failure), NYHA class 4 (HCC)   Acute respiratory failure ([place])   Cardiac arrest due to respiratory disorder [facility])   Primary Cardiologist: Dr Mare Ferrari  Patient Profile: 79 yo male w/ hx LVH, PACs, HTN, DM, HL and prostate CA. Admitted 12/01 w/ SBO, then ?PNA, cards saw 12/10 for tachycardia, felt ST w/ intermittent ectopy, 12/13 brady arrest 2nd SOB/mucus plugging  SUBJECTIVE: Breathing better than yesterday, passing gas but no BM yet.  OBJECTIVE Filed Vitals:   06/26/15 0000 06/26/15 0002 06/26/15 0344 06/26/15 0450  BP: 145/72     Pulse: 95     Temp:  97.6 F (36.4 C)  98 F (36.7 C)  TempSrc:  Oral  Oral  Resp: 19     Height:      Weight:    224 lb 3.3 oz (101.7 kg)  SpO2: 96%  98%     Intake/Output Summary (Last 24 hours) at 06/26/15 0742 Last data filed at 06/26/15 0700  Gross per 24 hour  Intake   1400 ml  Output   1910 ml  Net   -510 ml   Filed Weights   06/24/15 0506 06/25/15 0500 06/26/15 0450  Weight: 229 lb 4.5 oz (104 kg) 229 lb 15 oz (104.3 kg) 224 lb 3.3 oz (101.7 kg)    PHYSICAL EXAM General: Well developed, well nourished, male in no acute distress. Head: Normocephalic, atraumatic.  Neck: Supple without bruits, JVD difficult to assess 2nd habitus and equipment Lungs:  Resp regular and unlabored, rales bases. Heart: RRR, S1, S2, no S3, S4, soft murmur; no rub. Abdomen: firm, tender, distended, BS + x 4.  Extremities: No clubbing, cyanosis, 1+ edema.  Neuro: Alert and  oriented X 3. Moves all extremities spontaneously. Psych: Normal affect.  LABS: CBC: Recent Labs  06/24/15 0400 06/26/15 0500  WBC 12.5* 17.4*  NEUTROABS 10.6*  --   HGB 7.5* 8.3*  HCT 23.0* 25.1*  MCV 88.8 88.1  PLT 224 383   INR: Recent Labs  06/23/15 1311  INR [id number]   Basic Metabolic Panel: Recent Labs  06/24/15 0400 06/25/15 0530 06/26/15 0500  NA 136 141 140  K 4.3 3.7 4.0  CL 108 107 107  CO2 20* 21* 21*  GLUCOSE 321* 185* 175*  BUN 32* 30* 38*  CREATININE 1.03 1.07 1.25*  CALCIUM 6.7* 6.9* 7.0*  MG 2.3  --   --   PHOS 2.7  --   --    Liver Function Tests: Recent Labs  06/24/15 0400  AST 61*  ALT 20  ALKPHOS 655*  BILITOT 0.5  PROT 5.4*  ALBUMIN 1.6*   Cardiac Enzymes: Recent Labs  06/23/15 1311  TROPONINI <0.03   BNP:  B NATRIURETIC PEPTIDE  Date/Time Value Ref Range Status  06/23/2015 05:30 AM 147.8* 0.0 - 100.0 pg/mL Final  06/19/2015 11:20 AM 258.8* 0.0 - 100.0 pg/mL Final   Fasting Lipid Panel: Recent Labs  06/24/15 0400  TRIG 178*    TELE:  Radiology/Studies: Dg Chest Port 1 View 06/25/2015  CLINICAL DATA:  Respiratory failure. EXAM: PORTABLE CHEST 1 VIEW COMPARISON:  06/24/2015. FINDINGS: Interim extubation and removal of NG tube . Right PICC line, left IJ line stable position. Cardiomegaly. Persistent interstitial prominence, interstitial edema cannot be excluded. Persistent low lung volumes with right mid lung field and left base atelectasis and/or infiltrates. No pleural effusion or pneumothorax. IMPRESSION: 1. Interim extubation removal of NG tube. Left IJ line and right PICC line stable position. 2. Persistent cardiomegaly pulmonary interstitial prominence suggesting interstitial edema. No interim change. 3. Persistent low lung volumes with right mid lung and left base subsegmental atelectasis. Electronically Signed   By: [name]  Register   On: 06/25/2015 07:18     Current Medications:  . acetaminophen  1,000 mg Oral  TID  . diltiazem  360 mg Oral Daily  . famotidine (PEPCID) IV  20 mg Intravenous Q24H  . furosemide  40 mg Intravenous Q12H  . heparin subcutaneous  5,000 Units Subcutaneous 3 times per day  . insulin aspart  0-20 Units Subcutaneous 6 times per day  . insulin glargine  16 Units Subcutaneous QHS  . ipratropium-albuterol  3 mL Nebulization Q4H  . lip balm  1 application Topical BID  . methylPREDNISolone (SOLU-MEDROL) injection  60 mg Intravenous Q12H  . metoprolol  10 mg Intravenous 3 times per day  . nutrition supplement (JUVEN)  1 packet Oral BID BM  . pantoprazole (PROTONIX) IV  40 mg Intravenous QHS  . piperacillin-tazobactam (ZOSYN)  IV  3.375 g Intravenous Q8H  . prednisoLONE acetate  1 drop Both Eyes BID  . saccharomyces boulardii  250 mg Oral BID  . vancomycin  1,000 mg Intravenous Q12H      ASSESSMENT AND PLAN: Principal Problem:  SBO (small bowel obstruction) ischemic s/p LOA & SB resection 06/12/2015 - per [facility]   Abnormal CXR - likely ARDS in setting respitory arrest and traumatic intubation - however, wt was up 9 lbs since admit and had 2+ LE edema, improving - continue Lasix 40 mg IV BID 24-48 hr - I/O - 500 ml 12/15 but weight is going down - I/O net + 9.3 L since admit - BUN/Cr up slightly, follow but still needs diuresis   Sinus tachycardia (HCC) - on IV BB - equivalent PO dose would be Lopressor 50 mg tid - on PRN IV Lopressor as well. - continue IV rx for 24-48 hours, PO intake still not good  Otherwise, per [facility] Active Problems:  Hyperlipidemia  Hypertension  Prostate cancer ([place])  Anemia  HCAP (healthcare-associated pneumonia)  Metabolic acidosis  Type 2 diabetes mellitus ([place])  Enterococcus UTI  Hypertensive cardiovascular disease-EF 75%, LVH  Dyspnea-diastolic CHF  Acute respiratory failure ([place])  Cardiac arrest due to respiratory disorder ([place])  Signed, Barrett, Rhonda , PA-C 7:42 AM 06/26/2015  Slowly improving  Taking PO   Abdomen ok  Less wheezing on exam  Continue Iv diuretic.  Consider decreasing iv steroid dose as bronchospasm gone and may impede wound healing.  Rhythm stable  Jenkins Rouge

## 2015-06-26 NOTE — Progress Notes (Addendum)
PULMONARY / CRITICAL CARE MEDICINE   Name: Jeremy Jennings MRN: [id number] DOB: 01/17/1936    ADMISSION DATE:  06/11/2015 CONSULTATION DATE:  06/23/15  REFERRING MD :  CCS  CHIEF COMPLAINT:  Acute respiratory failure, cardiac arrest  INITIAL PRESENTATION: Admitted on 06/11/15 with abdominal pain. CT scan concerning for small bowel obstruction. Underwent ex-lap and small bowel resection on 06/12/15. Postoperative course notable for sinus tachycardia. Increasing respiratory distress on 12/13. Intubated and had a brief code.  STUDIES:  LE Dopplers 12/9 >> negative for DVT CT scan chest 12/9>> negative for PE, bibasilar pleural effusions and atelectasis Echo 12/9 >>LVEF 75%, mid-systolic LV cavitary obliteration, moderate LVH, upper normal LA size, mild TR, RVSP 34 mmHg + RAP (IVC not visualized). Chest x-ray 12/13 >> left basilar opacity, concerning for pneumonia.  SIGNIFICANT EVENTS: 12/2 >> exploratory laparoscopy, small bowel resection 12/13 >> PEA arrest, intubated 12/14 >> self extubated  HISTORY OF PRESENT ILLNESS:   Jeremy Jennings is a 79-year-old with history of diabetes mellitus, prostate cancer, diastolic heart failure. He is admitted on 12/1 with abdominal pain. He was found to have a small bowel obstruction. He was taken to the OR and underwent exploratory laparoscopy and small bowel resection on 06/12/15. Postop course complicated by ileus, enterococcus UTI. He was persistently in sinus tachycardia. He was evaluated by cardiology and diuresed without any change. He also was ruled out for PE with a CTA and lower extremity Dopplers were negative for DVT on 12/9.   On 12/13 it was noted that he was more dyspneic, wheezing, increased work of breathing. ABG was done which showed 7.4/33/92/96%. He subsequently underwent a PEA arrest. Time to ROSC was 6 minutes. He received 2 amps of Epi. Intubated for respiratory protection. Self extubated the next day.   PAST MEDICAL HISTORY :   has a  past medical history of Essential hypertension; Type 2 diabetes mellitus (HCC); Hyperlipidemia; Prostate cancer (HCC); History of GI bleed; Tachycardia; and PAC (premature atrial contraction).  has past surgical history that includes Tonsillectomy; Hemorroidectomy (1969); Esophagogastroduodenoscopy (N/A, 05/28/2015); Inguinal hernia repair (Bilateral, AFE 16-17); Hernia repair (1981); laparotomy (N/A, 06/12/2015); and Bowel resection (N/A, 06/12/2015). Prior to Admission medications   Medication Sig Start Date End Date Taking? Authorizing Gumaro Brightbill  abiraterone Acetate (ZYTIGA) 250 MG tablet Take 4 tablets (1,000 mg total) by mouth daily. Take on an empty stomach 1 hour before or 2 hours after a meal 05/22/15  Yes Firas N Shadad, MD  ACCU-CHEK SOFTCLIX LANCETS lancets Use as instructed to check blood sugar 2 times per day dx code E11.65 10/23/14  Yes Ajay Kumar, MD  acetaminophen (TYLENOL) 650 MG CR tablet Take 1 tablet (650 mg total) by mouth every 8 (eight) hours as needed for pain. 05/30/15  Yes Fang Xu, MD  atorvastatin (LIPITOR) 10 MG tablet take 1 tablet by mouth once daily 07/09/14  Yes Thomas Brackbill, MD  Blood Glucose Monitoring Suppl (ACCU-CHEK AVIVA PLUS) W/DEVICE KIT Use to check blood sugar 2 times per day dx code E11.65 10/23/14  Yes Ajay Kumar, MD  carvedilol (COREG) 12.5 MG tablet Take 1 tablet (12.5 mg total) by mouth 2 (two) times daily with a meal. 08/19/14  Yes Thomas Brackbill, MD  Cholecalciferol (VITAMIN D PO) Take 5,000 Units by mouth daily. Taking 5000 daily   Yes Historical Rodric Punch, MD  COMBIGAN 0.2-0.5 % ophthalmic solution Place 1 drop into the right eye daily.  04/09/13  Yes Historical Shacora Zynda, MD  Cyanocobalamin (VITAMIN B-12 PO) Take 1 tablet   by mouth daily.   Yes Historical Keili Hasten, MD  diltiazem (TIAZAC) 360 MG 24 hr capsule Take 360 mg by mouth daily.     Yes Historical Tel Hevia, MD  docusate sodium (COLACE) 100 MG capsule Take 100 mg by mouth daily.    Yes Historical  Muscab Brenneman, MD  glucose blood (ACCU-CHEK AVIVA PLUS) test strip Use as instructed to check blood sugar 2 times per day dx code E11.65 10/23/14  Yes Elayne Snare, MD  guaiFENesin (ROBITUSSIN) 100 MG/5ML liquid Take 200 mg by mouth 3 (three) times daily as needed for cough.   Yes Historical Rad Gramling, MD  insulin aspart (NOVOLOG FLEXPEN) 100 UNIT/ML FlexPen Inject 12 units 3 times per day with meals 12/05/14  Yes Elayne Snare, MD  Insulin Detemir (LEVEMIR) 100 UNIT/ML Pen Inject 12 Units into the skin daily at 10 pm.   Yes Historical Liberty Seto, MD  Insulin Pen Needle (B-D UF III MINI PEN NEEDLES) 31G X 5 MM MISC 1 each by Does not apply route 2 (two) times daily. 04/16/14  Yes Elayne Snare, MD  Liraglutide 18 MG/3ML SOPN Inject 1.8 mg into the skin daily. 06/09/14  Yes Elayne Snare, MD  megestrol (MEGACE) 400 MG/10ML suspension Take 10 mLs (400 mg total) by mouth 2 (two) times daily. 05/18/15  Yes Wyatt Portela, MD  oxyCODONE-acetaminophen (PERCOCET/ROXICET) 5-325 MG tablet Take 1 tablet by mouth every 4 (four) hours as needed for moderate pain or severe pain. 05/30/15  Yes Florencia Reasons, MD  pioglitazone (ACTOS) 30 MG tablet Take 1 tablet (30 mg total) by mouth daily. 01/19/15  Yes Elayne Snare, MD  prednisoLONE acetate (PRED FORTE) 1 % ophthalmic suspension Place 1 drop into both eyes 2 (two) times daily.  04/25/13  Yes Historical Dabney Dever, MD  tamsulosin (FLOMAX) 0.4 MG CAPS capsule Take 0.4 mg by mouth daily.  08/02/13  Yes Historical Vontrell Pullman, MD   No Known Allergies  FAMILY HISTORY:  indicated that his mother is deceased. He indicated that his father is deceased.  SOCIAL HISTORY:  reports that he quit smoking about 35 years ago. His smoking use included Cigarettes. He has a 5 pack-year smoking history. He has never used smokeless tobacco. He reports that he does not drink alcohol or use illicit drugs.   SUBJECTIVE: More awake today, no respiratory distress.  VITAL SIGNS: Temp:  [97.5 F (36.4 C)-98.1 F (36.7  C)] 97.5 F (36.4 C) (12/16 0800) Pulse Rate:  [87-106] 95 (12/16 0800) Resp:  [14-21] 19 (12/16 0800) BP: (125-177)/(59-139) 154/101 mmHg (12/16 0800) SpO2:  [96 %-100 %] 98 % (12/16 0833) Weight:  [224 lb 3.3 oz (101.7 kg)] 224 lb 3.3 oz (101.7 kg) (12/16 0450) HEMODYNAMICS: CVP:  [6 mmHg-10 mmHg] 6 mmHg VENTILATOR SETTINGS:   INTAKE / OUTPUT:  Intake/Output Summary (Last 24 hours) at 06/26/15 0953 Last data filed at 06/26/15 0900  Gross per 24 hour  Intake   1080 ml  Output   2030 ml  Net   -950 ml    PHYSICAL EXAMINATION: General:  Awake, oriented Neuro:  PERRLA, no gross focal deficits HEENT:  Moist mucous membranes Cardiovascular:  Regular rate and rhythm, no MRG Lungs:  No wheeze, no crackles Abdomen:  Distended, soft, nontender. Diminished bowel sounds, abdominal incision clean and dry. Skin:  Intact  LABS:  CBC  Recent Labs Lab 06/23/15 1311 06/24/15 0400 06/26/15 0500  WBC 10.9* 12.5* 17.4*  HGB 8.0* 7.5* 8.3*  HCT 25.5* 23.0* 25.1*  PLT 250 224 383   Coag's  Recent Labs Lab 06/22/15 0515 06/23/15 1311  INR 1.24 1.30   BMET  Recent Labs Lab 06/24/15 0400 06/25/15 0530 06/26/15 0500  NA 136 141 140  K 4.3 3.7 4.0  CL 108 107 107  CO2 20* 21* 21*  BUN 32* 30* 38*  CREATININE 1.03 1.07 1.25*  GLUCOSE 321* 185* 175*   Electrolytes  Recent Labs Lab 06/22/15 0515 06/23/15 0450  06/24/15 0400 06/25/15 0530 06/26/15 0500  CALCIUM 6.3* 6.5*  < > 6.7* 6.9* 7.0*  MG 1.9 1.9  --  2.3  --   --   PHOS 2.0* 2.5  --  2.7  --   --   < > = values in this interval not displayed. Sepsis Markers  Recent Labs Lab 06/19/15 1120 06/19/15 1430 06/23/15 1311 06/24/15 0400 06/25/15 0530 06/26/15 0500  LATICACIDVEN 1.1 1.1 1.6  --   --   --   PROCALCITON  --   --   --  1.75 1.31 0.83   ABG  Recent Labs Lab 06/23/15 1033 06/23/15 1600  PHART 7.379 7.347*  PCO2ART 33.0* 33.6*  PO2ART 91.6 321*   Liver Enzymes  Recent Labs Lab  06/22/15 0515 06/24/15 0400  AST 46* 61*  ALT 21 20  ALKPHOS 327* 655*  BILITOT 0.5 0.5  ALBUMIN 1.5* 1.6*   Cardiac Enzymes  Recent Labs Lab 06/19/15 1120 06/23/15 1311  TROPONINI 0.04* <0.03   Glucose  Recent Labs Lab 06/25/15 1146 06/25/15 1559 06/25/15 2009 06/25/15 2350 06/26/15 0433 06/26/15 0738  GLUCAP 275* 206* 206* 161* 170* 166*    Imaging No results found. ASSESSMENT / PLAN:  79 year old metastatic prostate cancer. Status post exploratory laparoscopy with bowel resection for small bowel obstruction. Intubated for acute respiratory failure with wheezing and increased work of breathing. Brief PEA arrest  Reason for his decompensation is not clear. He was wheezing before his intubation and may have an element of COPD although his smoking history is minimal. He does have infiltrates on chest x-ray suggestive of HCAP . He may have mucous plug leading up to his respiratory arrest. PE, DVT was ruled out on 12/9. I do not think he has developed a PE in the interim.  PULMONARY OETT 12/13 >> 12/14 A: Acute respiratory failure HCAP ? COPD exacerbation ? PE P:   Wean down FiO2 as tolerated Duo nebs every 4 Continue steroids. Can change to prednisone 60 mg today and wean by 10 mg every day.  CARDIOVASCULAR CVL A:  Sinus tachycardia Diastolic heart failure P:  Continue Lasix 40 IV bid. Continue Cardizem and Lopressor as per cardiology.  RENAL A:   Stable P:   Monitor BUN/creatinine, urine output Repeat lytes as indicated  GASTROINTESTINAL A:   Small bowel obstruction with hernia Status post exploratory laparoscopy, small bowel resection Postop ileus P:   Advancing diet Protonix for SUP  HEMATOLOGIC A: Stable P:   INFECTIOUS A:  HCAP Enterococcus UTI. P:   BCx2 >> UC 12/9 >> Enterococcus Sputum >> Abx: Vano, start date 12/13 >> 12/16 Zosyn, start date 12/13 >>   All cultures are negative, Procalcitonin is improving. DC  vancomycin. Continue Zosyn for 7 days. End date 12/19 Follow sputum cultures, ICU procalcitonin protocol  ENDOCRINE A:  Diabetes mellitus P:   Continue Lantus 16 U Changes SSI to before AC/HS as he is taking PO diet.  NEUROLOGIC A:    P:   RASS goal: 0 Fentanyl drip and Versed when necessary  FAMILY  - Updates:  Daughter updated at bedside 12/16 - Inter-disciplinary family meet or Palliative Care meeting due by: 12/20.  TODAY'S SUMMARY: Stable respiratory status. Delirium is improving.  Marshell Garfinkel MD [facility] Pager [phone] If no answer or after 3pm call: [phone] 06/26/2015, 9:53 AM

## 2015-06-26 NOTE — Progress Notes (Signed)
Pt was awake and lying in bed when I arrived. His daughter was bedside. He said he was feeling ok - just congested. His daughter confirmed staff is aware and taking care of him. Pt said otherwise he just wanted to get some sleep tonight. Please page if additional support is needed. Chaplain Marlise Eves Holder   06/26/15 2000  Clinical Encounter Type  Visited With Patient and family together

## 2015-06-26 NOTE — Progress Notes (Signed)
Patient ID: Jeremy Jennings, male   DOB: 12/01/1935, 79 y.o.   MRN: [id number]  [facility]  POD#: 14  Subjective: Patient in bed in ICU, family at bedside.  No complaints.  Denies abd pain, nausea.  Tolerated clear liquid diet.  States passing flatus.  Objective: Vital signs in last 24 hours: Temp:  [97.6 F (36.4 C)-98.1 F (36.7 C)] 98 F (36.7 C) (12/16 0450) Pulse Rate:  [87-111] 95 (12/16 0000) Resp:  [14-23] 19 (12/16 0000) BP: (125-177)/(59-139) 145/72 mmHg (12/16 0000) SpO2:  [96 %-100 %] 98 % (12/16 0344) Weight:  [101.7 kg (224 lb 3.3 oz)] 101.7 kg (224 lb 3.3 oz) (12/16 0450) Last BM Date: 06/23/15  Intake/Output from previous day: 12/15 0701 - 12/16 0700 In: 1400 [P.O.:800; IV Piggyback:600] Out: 1910 [Urine:1910] Intake/Output this shift:    Physical Exam: HEENT - sclerae clear, mucous membranes moist Neck - soft Chest - clear bilaterally Cor - RRR Abdomen - softer, less distended; active BS present; wound dry and intact with staples in place  Lab Results:   Recent Labs  06/24/15 0400 06/26/15 0500  WBC 12.5* 17.4*  HGB 7.5* 8.3*  HCT 23.0* 25.1*  PLT 224 383   BMET  Recent Labs  06/25/15 0530 06/26/15 0500  NA 141 140  K 3.7 4.0  CL 107 107  CO2 21* 21*  GLUCOSE 185* 175*  BUN 30* 38*  CREATININE 1.07 1.25*  CALCIUM 6.9* 7.0*   PT/INR  Recent Labs  06/23/15 1311  LABPROT 16.3*  INR 1.30   Comprehensive Metabolic Panel:    Component Value Date/Time   NA 140 06/26/2015 0500   NA 141 06/25/2015 0530   NA 142 05/18/2015 0838   NA 138 04/22/2015 1509   K 4.0 06/26/2015 0500   K 3.7 06/25/2015 0530   K 3.5 05/18/2015 0838   K 3.5 04/22/2015 1509   CL 107 06/26/2015 0500   CL 107 06/25/2015 0530   CO2 21* 06/26/2015 0500   CO2 21* 06/25/2015 0530   CO2 18* 05/18/2015 0838   CO2 22 04/22/2015 1509   BUN 38* 06/26/2015 0500   BUN 30* 06/25/2015 0530   BUN 18.3 05/18/2015 0838   BUN 16.2  04/22/2015 1509   CREATININE 1.25* 06/26/2015 0500   CREATININE 1.07 06/25/2015 0530   CREATININE 0.8 05/18/2015 0838   CREATININE 0.9 04/22/2015 1509   GLUCOSE 175* 06/26/2015 0500   GLUCOSE 185* 06/25/2015 0530   GLUCOSE 84 05/18/2015 0838   GLUCOSE 288* 04/22/2015 1509   CALCIUM 7.0* 06/26/2015 0500   CALCIUM 6.9* 06/25/2015 0530   CALCIUM 8.4 05/18/2015 0838   CALCIUM 7.9* 04/22/2015 1509   AST 61* 06/24/2015 0400   AST 46* 06/22/2015 0515   AST 18 05/18/2015 0838   AST 15 04/22/2015 1509   ALT 20 06/24/2015 0400   ALT 21 06/22/2015 0515   ALT <9 05/18/2015 0838   ALT <9 04/22/2015 1509   ALKPHOS 655* 06/24/2015 0400   ALKPHOS 327* 06/22/2015 0515   ALKPHOS 488* 05/18/2015 0838   ALKPHOS 499* 04/22/2015 1509   BILITOT 0.5 06/24/2015 0400   BILITOT 0.5 06/22/2015 0515   BILITOT 0.33 05/18/2015 0838   BILITOT 0.37 04/22/2015 1509   PROT 5.4* 06/24/2015 0400   PROT 4.8* 06/22/2015 0515   PROT 6.5 05/18/2015 0838   PROT 5.9* 04/22/2015 1509   ALBUMIN 1.6* 06/24/2015 0400   ALBUMIN 1.5* 06/22/2015 0515   ALBUMIN 2.4* 05/18/2015 [id number]  ALBUMIN 2.2* 04/22/2015 1509    Studies/Results: Dg Chest Port 1 View  06/25/2015  CLINICAL DATA:  Respiratory failure. EXAM: PORTABLE CHEST 1 VIEW COMPARISON:  06/24/2015. FINDINGS: Interim extubation and removal of NG tube . Right PICC line, left IJ line stable position. Cardiomegaly. Persistent interstitial prominence, interstitial edema cannot be excluded. Persistent low lung volumes with right mid lung field and left base atelectasis and/or infiltrates. No pleural effusion or pneumothorax. IMPRESSION: 1. Interim extubation removal of NG tube. Left IJ line and right PICC line stable position. 2. Persistent cardiomegaly pulmonary interstitial prominence suggesting interstitial edema. No interim change. 3. Persistent low lung volumes with right mid lung and left base subsegmental atelectasis. Electronically Signed   By: [name]  Register   On:  06/25/2015 07:18    Anti-infectives: Anti-infectives    Start     Dose/Rate Route Frequency Ordered Stop   06/23/15 1700  vancomycin (VANCOCIN) IVPB 1000 mg/200 mL premix     1,000 mg 200 mL/hr over 60 Minutes Intravenous Every 12 hours 06/23/15 1414     06/23/15 1430  piperacillin-tazobactam (ZOSYN) IVPB 3.375 g     3.375 g 12.5 mL/hr over 240 Minutes Intravenous Every 8 hours 06/23/15 1416     06/23/15 1415  piperacillin-tazo (ZOSYN) NICU IV syringe 200 mg/mL  Status:  Discontinued     75 mg/kg  105.4 kg 79 mL/hr over 30 Minutes Intravenous Every 8 hours 06/23/15 1401 06/23/15 1416   06/23/15 1000  levofloxacin (LEVAQUIN) tablet 750 mg  Status:  Discontinued     750 mg Oral Daily 06/23/15 0750 06/23/15 1401   06/22/15 1600  vancomycin (VANCOCIN) IVPB 1000 mg/200 mL premix  Status:  Discontinued     1,000 mg 200 mL/hr over 60 Minutes Intravenous Every 12 hours 06/22/15 1346 06/23/15 0740   06/20/15 0200  vancomycin (VANCOCIN) 1,250 mg in sodium chloride 0.9 % 250 mL IVPB  Status:  Discontinued     1,250 mg 166.7 mL/hr over 90 Minutes Intravenous Every 12 hours 06/19/15 1314 06/22/15 1346   06/19/15 1400  vancomycin (VANCOCIN) 2,000 mg in sodium chloride 0.9 % 500 mL IVPB     2,000 mg 250 mL/hr over 120 Minutes Intravenous  Once 06/19/15 1313 06/19/15 1737   06/19/15 1315  piperacillin-tazobactam (ZOSYN) IVPB 3.375 g  Status:  Discontinued     3.375 g 12.5 mL/hr over 240 Minutes Intravenous Every 8 hours 06/19/15 1305 06/23/15 0740   06/19/15 1100  cefTRIAXone (ROCEPHIN) 2 g in dextrose 5 % 50 mL IVPB  Status:  Discontinued     2 g 100 mL/hr over 30 Minutes Intravenous Every 24 hours 06/19/15 1020 06/19/15 1246   06/12/15 0930  cefoTEtan (CEFOTAN) 2 g in dextrose 5 % 50 mL IVPB     2 g 100 mL/hr over 30 Minutes Intravenous On call to O.R. 06/12/15 0852 06/12/15 1359      Assessment & Plans: POD#14 - exlap with SBR for strangulated bowel and internal hernia  - advance to full  liquids today  - leave staples another day or two Acute respiratory failure  - self extubated, doing well  - per CCM Pneumonia  - Vanco, Zosyn  - ?duration of therapy  Earnstine Regal, MD, [facility] Office: [place] 06/26/2015

## 2015-06-26 NOTE — Progress Notes (Signed)
Patient nauseated, no improvement, vomiting began, surgery called, and patient changed to NPO.

## 2015-06-27 ENCOUNTER — Inpatient Hospital Stay (HOSPITAL_COMMUNITY): Payer: Medicare Other

## 2015-06-27 DIAGNOSIS — I10 Essential (primary) hypertension: Secondary | ICD-10-CM

## 2015-06-27 DIAGNOSIS — J989 Respiratory disorder, unspecified: Secondary | ICD-10-CM

## 2015-06-27 DIAGNOSIS — N390 Urinary tract infection, site not specified: Secondary | ICD-10-CM

## 2015-06-27 DIAGNOSIS — I468 Cardiac arrest due to other underlying condition: Secondary | ICD-10-CM

## 2015-06-27 DIAGNOSIS — J189 Pneumonia, unspecified organism: Secondary | ICD-10-CM

## 2015-06-27 DIAGNOSIS — K913 Postprocedural intestinal obstruction: Secondary | ICD-10-CM

## 2015-06-27 DIAGNOSIS — J9601 Acute respiratory failure with hypoxia: Secondary | ICD-10-CM

## 2015-06-27 DIAGNOSIS — B952 Enterococcus as the cause of diseases classified elsewhere: Secondary | ICD-10-CM

## 2015-06-27 DIAGNOSIS — I5031 Acute diastolic (congestive) heart failure: Secondary | ICD-10-CM

## 2015-06-27 LAB — BASIC METABOLIC PANEL
Anion gap: 10 (ref 5–15)
BUN: 33 mg/dL — AB (ref 6–20)
CHLORIDE: 110 mmol/L (ref 101–111)
CO2: 23 mmol/L (ref 22–32)
CREATININE: 1.23 mg/dL (ref 0.61–1.24)
Calcium: 6.7 mg/dL — ABNORMAL LOW (ref 8.9–10.3)
GFR calc Af Amer: >60 mL/min (ref >60)
GFR calc non Af Amer: 55 mL/min — ABNORMAL LOW (ref >60)
Glucose, Bld: 240 mg/dL — ABNORMAL HIGH (ref 65–99)
Potassium: 3.5 mmol/L (ref 3.5–5.1)
SODIUM: 143 mmol/L (ref 135–145)

## 2015-06-27 LAB — ACETAMINOPHEN LEVEL: Acetaminophen (Tylenol), Serum: <10 ug/mL — ABNORMAL LOW (ref 10–30)

## 2015-06-27 LAB — MAGNESIUM: MAGNESIUM: 1.9 mg/dL (ref 1.7–2.4)

## 2015-06-27 LAB — PHOSPHORUS: PHOSPHORUS: 2.3 mg/dL — AB (ref 2.5–4.6)

## 2015-06-27 LAB — GLUCOSE, CAPILLARY
GLUCOSE-CAPILLARY: 176 mg/dL — AB (ref 65–99)
GLUCOSE-CAPILLARY: 120 mg/dL — AB (ref 65–99)
Glucose-Capillary: 226 mg/dL — ABNORMAL HIGH (ref 65–99)
Glucose-Capillary: 115 mg/dL — ABNORMAL HIGH (ref 65–99)

## 2015-06-27 MED ORDER — POTASSIUM CHLORIDE 10 MEQ/50ML IV SOLN
10.0000 meq | INTRAVENOUS | Status: AC
  Administered 2015-06-27 (×2): 10 meq via INTRAVENOUS
  Filled 2015-06-27 (×2): qty 50

## 2015-06-27 MED ORDER — ARFORMOTEROL TARTRATE 15 MCG/2ML IN NEBU
15.0000 ug | INHALATION_SOLUTION | Freq: Two times a day (BID) | RESPIRATORY_TRACT | Status: DC
  Filled 2015-06-27: qty 2

## 2015-06-27 MED ORDER — ONDANSETRON HCL 4 MG/2ML IJ SOLN
4.0000 mg | INTRAMUSCULAR | Status: DC | PRN
  Administered 2015-06-27 – 2015-06-28 (×2): 4 mg via INTRAVENOUS
  Filled 2015-06-27 (×2): qty 2

## 2015-06-27 MED ORDER — FUROSEMIDE 10 MG/ML IJ SOLN
20.0000 mg | Freq: Two times a day (BID) | INTRAMUSCULAR | Status: DC
  Administered 2015-06-27 (×2): 20 mg via INTRAVENOUS
  Filled 2015-06-27 (×3): qty 2

## 2015-06-27 MED ORDER — FENTANYL CITRATE (PF) 100 MCG/2ML IJ SOLN
50.0000 ug | INTRAMUSCULAR | Status: DC | PRN
  Administered 2015-06-28 (×3): 50 ug via INTRAVENOUS
  Filled 2015-06-27 (×3): qty 2

## 2015-06-27 MED ORDER — POTASSIUM PHOSPHATES 15 MMOLE/5ML IV SOLN
10.0000 mmol | Freq: Once | INTRAVENOUS | Status: AC
  Administered 2015-06-27: 10 mmol via INTRAVENOUS
  Filled 2015-06-27: qty 3.33

## 2015-06-27 MED ORDER — TRACE MINERALS CR-CU-MN-SE-ZN 10-1000-500-60 MCG/ML IV SOLN
INTRAVENOUS | Status: AC
  Administered 2015-06-27: 17:00:00 via INTRAVENOUS
  Filled 2015-06-27: qty 1000

## 2015-06-27 MED ORDER — INSULIN ASPART 100 UNIT/ML ~~LOC~~ SOLN
0.0000 [IU] | SUBCUTANEOUS | Status: DC
  Administered 2015-06-27: 4 [IU] via SUBCUTANEOUS
  Administered 2015-06-28: 3 [IU] via SUBCUTANEOUS
  Administered 2015-06-29: 7 [IU] via SUBCUTANEOUS
  Administered 2015-06-29 – 2015-06-30 (×8): 4 [IU] via SUBCUTANEOUS
  Administered 2015-06-30: 7 [IU] via SUBCUTANEOUS
  Administered 2015-06-30: 4 [IU] via SUBCUTANEOUS
  Administered 2015-07-01 (×3): 3 [IU] via SUBCUTANEOUS

## 2015-06-27 MED ORDER — FENTANYL CITRATE (PF) 100 MCG/2ML IJ SOLN
50.0000 ug | INTRAMUSCULAR | Status: DC | PRN
  Administered 2015-06-27: 50 ug via INTRAVENOUS
  Filled 2015-06-27: qty 2

## 2015-06-27 MED ORDER — FENTANYL CITRATE (PF) 100 MCG/2ML IJ SOLN
25.0000 ug | Freq: Once | INTRAMUSCULAR | Status: AC
  Administered 2015-06-27: 25 ug via INTRAVENOUS
  Filled 2015-06-27: qty 2

## 2015-06-27 MED ORDER — LIDOCAINE HCL 2 % EX GEL
1.0000 "application " | Freq: Once | CUTANEOUS | Status: AC
  Administered 2015-06-27: 1 via TOPICAL
  Filled 2015-06-27: qty 5

## 2015-06-27 NOTE — Progress Notes (Addendum)
PARENTERAL NUTRITION CONSULT NOTE - INITIAL  Pharmacy Consult for TPN Indication: Prolonged Ileus  No Known Allergies  Patient Measurements: Height: 6' (182.9 cm) Weight: 214 lb 4.6 oz (97.2 kg) IBW/kg (Calculated) : 77.6 Adjusted Body Weight: 82.5 kg  Vital Signs: Temp: 98.3 F (36.8 C) (12/17 0900) Temp Source: Oral (12/17 0900) BP: 148/72 mmHg (12/17 0600) Pulse Rate: 88 (12/17 0850) Intake/Output from previous day: 12/16 0701 - 12/17 0700 In: 1164.2 [P.O.:240; I.V.:669.2; IV Piggyback:255] Out: 3670 [Urine:3170; Emesis/NG output:500] Intake/Output from this shift: Total I/O In: -  Out: 600 [Urine:400; Emesis/NG output:200]  Labs:  Recent Labs  06/26/15 0500  WBC 17.4*  HGB 8.3*  HCT 25.1*  PLT 383     Recent Labs  06/25/15 0530 06/26/15 0500 06/27/15 0545 06/27/15 0941  NA 141 140 143  --   K 3.7 4.0 3.5  --   CL 107 107 110  --   CO2 21* 21* 23  --   GLUCOSE 185* 175* 240*  --   BUN 30* 38* 33*  --   CREATININE 1.07 1.25* 1.23  --   CALCIUM 6.9* 7.0* 6.7*  --   MG  --   --   --  1.9  PHOS  --   --   --  2.3*   Estimated Creatinine Clearance: 60.8 mL/min (by C-G formula based on Cr of 1.23).    Recent Labs  06/26/15 1629 06/26/15 2138 06/27/15 0801  GLUCAP 194* 211* 226*    Medical History: Past Medical History  Diagnosis Date  . Essential hypertension   . Type 2 diabetes mellitus ([place])   . Hyperlipidemia   . Prostate cancer ([place])   . History of GI bleed   . Tachycardia   . PAC (premature atrial contraction)     Frequent   Insulin Requirements:  19 units SSI Lantus 16 units daily  Current Nutrition: NPO (unable to take Juven powder nutritional supplements currently)  IVF: D5NS at 50 ml/hr  Central access: PICC placed 12/6 TPN start date: 12/6-12/14, resumed 12/17  ASSESSMENT                                                                                                          HPI: 51 y/oM with widely metastatic prostate  cancer who presented 12/1 with abdominal pain nausea and vomiting. CT scan concerning for SBO. Patient underwent emergency laparotomy with small bowel resection on 12/2. Patient developed post-op ileus. CCS  consulted pharmacy to begin TPN for prolonged ileus on 12/6, and patient remained on TPN until 12/14. Asked to resume TPN today, 12/17 for nutrition support as patient with increased n/v, unable to take POs.  Significant events:  12/10: transferred to ICU 12/11: significant increase in SCr, CBG's remain elevated 12/13: start soft diet, orders to wean TPN to off **afternoon update**PEA arrest, pt intubated, consulted to continue TPN 12/14: self extubated, TPN weaned off 12/17: more nausea and emesis, not able to take POs. NGT placed for persistent ileus. Resume TPN  Today:    Glucose -  CBGs uncontrolled, 166-261 (goal < 150)   Electrolytes - K on low end of normal at 3.5, Phos low at 2.3, Corrected Calcium slightly low at 8.62, all others WNL  Renal - SCr 1.23 with CrCl ~ 61 ml/min   LFTs - AST elevated at 61, ALT WNL, Tbili WNL, Alk Phos elevated at 655 (labs from 12/14)  TGs - 157 (12/7), 149 (12/12), 178 (12/14)  Prealbumin - 3.5 (12/6), 4.4 (12/7), 2.4 (12/12), 4.8 (12/14)  NUTRITIONAL GOALS                                                                                             RD recs (revised 06/25/15): Kcal: 2000-2200, Protein: 100-110g  Clinimix 5/15 at a goal rate of 90 ml/hr + 20% fat emulsion at 10 ml/hr to provide: 108g/day protein, 2013Kcal/day.  PLAN         KCl 10 mEq IV x 2 runs ordered per MD.  KPhos 10 mmol IV x 1                                                                                                                   At 1800 today:  Start Clinimix E 5/15 at 42 ml/hr. Could use Clinimix 5/20 to obtain higher Kcal while withholding lipids, but due to uncontrolled CBGs, will opt to use 5/15 at this time. Will also resume at ~ 1/2 goal rate  instead of full goal rate due to same.   Withhold IV fat emulsion for 1st seven days of ICU stay per SCCM/ASPEN guidelines (today is day 5).  Add 15 units regular insulin to TPN bag.  Plan to advance as tolerated to the goal rate.  TPN to contain standard multivitamins and trace elements.  D/C IVF per MD order.  Continue resistant SSI q4h and Lantus 16 units qHS per MD.   CMET, CBC with diff, Mag, Phos, TG, Prealbumin in AM.   TPN lab panels on Mondays & Thursdays.  F/u daily.   Lindell Spar, PharmD, BCPS Pager: [phone] 06/27/2015 12:01 PM

## 2015-06-27 NOTE — Progress Notes (Signed)
SUBJECTIVE: Says breathing is "about the same as yesterday". Daughter notices much less wheezing. NG tube out but due to abdominal discomfort, plans to reintroduce. No chest pain.     Intake/Output Summary (Last 24 hours) at 06/27/15 0842 Last data filed at 06/27/15 0836  Gross per 24 hour  Intake 1164.17 ml  Output   3870 ml  Net -2705.83 ml    Current Facility-Administered Medications  Medication Dose Route Frequency Provider Last Rate Last Dose  . acetaminophen (TYLENOL) tablet 1,000 mg  1,000 mg Oral TID Michael Boston, MD   1,000 mg at 06/26/15 1633  . antiseptic oral rinse (CPC / CETYLPYRIDINIUM CHLORIDE 0.05%) solution 7 mL  7 mL Mouth Rinse BID Michael Boston, MD   7 mL at 06/27/15 0104  . arformoterol (BROVANA) nebulizer solution 15 mcg  15 mcg Nebulization BID Elsie Stain, MD      . bisacodyl (DULCOLAX) suppository 10 mg  10 mg Rectal Daily PRN Nat Christen, PA-C      . dextrose 5 %-0.9 % sodium chloride infusion   Intravenous Continuous Elsie Stain, MD 50 mL/hr at 06/26/15 1637    . diphenhydrAMINE (BENADRYL) 12.5 MG/5ML elixir 12.5 mg  12.5 mg Oral Q6H PRN Saverio Danker, PA-C       Or  . diphenhydrAMINE (BENADRYL) injection 12.5 mg  12.5 mg Intravenous Q6H PRN Saverio Danker, PA-C      . famotidine (PEPCID) IVPB 20 mg premix  20 mg Intravenous Q24H Lavina Hamman, MD   20 mg at 06/26/15 1200  . fentaNYL (SUBLIMAZE) injection 25 mcg  25 mcg Intravenous Once Elsie Stain, MD      . fentaNYL (SUBLIMAZE) injection 50 mcg  50 mcg Intravenous Q2H PRN Elsie Stain, MD      . furosemide (LASIX) injection 40 mg  40 mg Intravenous Q12H Praveen Mannam, MD   40 mg at 06/26/15 2200  . heparin injection 5,000 Units  5,000 Units Subcutaneous 3 times per day Earnstine Regal, PA-C   5,000 Units at 06/27/15 0506  . insulin aspart (novoLOG) injection 0-20 Units  0-20 Units Subcutaneous TID WC Praveen Mannam, MD   4 Units at 06/26/15 1631  . insulin glargine (LANTUS)  injection 16 Units  16 Units Subcutaneous QHS Marshell Garfinkel, MD   16 Units at 06/26/15 2201  . lidocaine (XYLOCAINE) 2 % jelly 1 application  1 application Topical Once Elsie Stain, MD      . lip balm (CARMEX) ointment 1 application  1 application Topical BID Michael Boston, MD   1 application at [id number]  . magic mouthwash  15 mL Oral QID PRN Michael Boston, MD      . methocarbamol (ROBAXIN) 500 mg in dextrose 5 % 50 mL IVPB  500 mg Intravenous Q8H PRN Lavina Hamman, MD   500 mg at 06/27/15 0104  . metoprolol (LOPRESSOR) injection 10 mg  10 mg Intravenous 3 times per day Lonn Georgia, PA-C   10 mg at 06/27/15 [id number]  . nutrition supplement (JUVEN) (JUVEN) powder packet 1 packet  1 packet Oral BID BM Clayton Bibles, RD   1 packet at 06/26/15 1400  . ondansetron (ZOFRAN-ODT) disintegrating tablet 4 mg  4 mg Oral Q6H PRN Saverio Danker, PA-C       Or  . ondansetron [facility]) injection 4 mg  4 mg Intravenous Q6H PRN Saverio Danker, PA-C   4 mg at 06/27/15 0248  .  oxyCODONE (Oxy IR/ROXICODONE) immediate release tablet 5-10 mg  5-10 mg Oral Q4H PRN Emina Riebock, NP   10 mg at 06/25/15 0914  . piperacillin-tazobactam (ZOSYN) IVPB 3.375 g  3.375 g Intravenous Q8H Praveen Mannam, MD   3.375 g at 06/27/15 0553  . potassium chloride 10 mEq in 50 mL *CENTRAL LINE* IVPB  10 mEq Intravenous Q1 Hr x 2 Elsie Stain, MD      . prednisoLONE acetate (PRED FORTE) 1 % ophthalmic suspension 1 drop  1 drop Both Eyes BID Saverio Danker, PA-C   1 drop at 06/26/15 2202  . sodium chloride 0.9 % injection 10-40 mL  10-40 mL Intracatheter PRN Earnstine Regal, PA-C   10 mL at 06/18/15 1649    Filed Vitals:   06/27/15 0336 06/27/15 0400 06/27/15 0500 06/27/15 0600  BP:  176/61  148/72  Pulse:  94  81  Temp:  98.4 F (36.9 C)    TempSrc:      Resp:  17  20  Height:      Weight:   214 lb 4.6 oz (97.2 kg)   SpO2: 98% 95%  99%    PHYSICAL EXAM General: NAD HEENT: Normal. Neck: No JVD, no thyromegaly.    Lungs: Scattered wheezes anteriorly CV: Nondisplaced PMI.  Regular rate and rhythm, normal S1/S2, no S3/S4, no murmur.  Trivial pretibial edema.    Abdomen: Firm, + distention.  Neurologic: Alert and oriented x 3.  Psych: Normal affect. Extremities: No clubbing or cyanosis.   TELEMETRY: Reviewed telemetry pt in sinus rhythm.  LABS: Basic Metabolic Panel:  Recent Labs  06/26/15 0500 06/27/15 0545  NA 140 143  K 4.0 3.5  CL 107 110  CO2 21* 23  GLUCOSE 175* 240*  BUN 38* 33*  CREATININE 1.25* 1.23  CALCIUM 7.0* 6.7*   Liver Function Tests: No results for input(s): AST, ALT, ALKPHOS, BILITOT, PROT, ALBUMIN in the last 72 hours. No results for input(s): LIPASE, AMYLASE in the last 72 hours. CBC:  Recent Labs  06/26/15 0500  WBC 17.4*  HGB 8.3*  HCT 25.1*  MCV 88.1  PLT 383   Cardiac Enzymes: No results for input(s): CKTOTAL, CKMB, CKMBINDEX, TROPONINI in the last 72 hours. BNP: Invalid input(s): POCBNP D-Dimer: No results for input(s): DDIMER in the last 72 hours. Hemoglobin A1C: No results for input(s): HGBA1C in the last 72 hours. Fasting Lipid Panel: No results for input(s): CHOL, HDL, LDLCALC, TRIG, CHOLHDL, LDLDIRECT in the last 72 hours. Thyroid Function Tests: No results for input(s): TSH, T4TOTAL, T3FREE, THYROIDAB in the last 72 hours.  Invalid input(s): FREET3 Anemia Panel: No results for input(s): VITAMINB12, FOLATE, FERRITIN, TIBC, IRON, RETICCTPCT in the last 72 hours.  RADIOLOGY: Dg Chest 2 View  06/19/2015  CLINICAL DATA:  Fever and weakness EXAM: CHEST  2 VIEW COMPARISON:  Four days ago FINDINGS: Persistent opacity at the left base with elevated diaphragm. Right upper extremity PICC has been placed, tip at the SVC. Normal heart size for technique. Stable upper mediastinal contours. No pulmonary edema or pleural effusion. IMPRESSION: 1. Persistent left basilar atelectasis. Given fever history, note that there could be superimposed pneumonia. 2.  New right upper extremity PICC is in good position. Electronically Signed   By: Monte Fantasia M.D.   On: 06/19/2015 10:50   Dg Chest 2 View  06/15/2015  CLINICAL DATA:  Wheezing on expiration EXAM: CHEST  2 VIEW COMPARISON:  Three days ago FINDINGS: Improved lung volumes in general, but still  with bandlike opacity over the left base consistent with atelectasis. The left diaphragm is elevated. Trace pleural effusions. There is also diffuse interstitial coarsening. Chronic cardiomegaly. Stable aortic and hilar contours. Nasogastric tube tip at the proximal stomach. IMPRESSION: 1. Multi segment left basilar atelectasis. 2. Pulmonary venous congestion and trace effusions. 3. Shorter nasogastric tube with tip at the proximal stomach. Electronically Signed   By: Monte Fantasia M.D.   On: 06/15/2015 07:29   Ct Angio Chest Pe W/cm &/or Wo Cm  06/19/2015  CLINICAL DATA:  Inpatient hospitalized with central abdominal cramping and constipation. Post laparotomy and bowel resection 1 week prior. Today with fever and tachycardia. Patient with history of prostate cancer with bony metastasis. EXAM: CT ANGIOGRAPHY CHEST CT ABDOMEN AND PELVIS WITH CONTRAST TECHNIQUE: Multidetector CT imaging of the chest was performed using the standard protocol during bolus administration of intravenous contrast. Multiplanar CT image reconstructions and MIPs were obtained to evaluate the vascular anatomy. Multidetector CT imaging of the abdomen and pelvis was performed using the standard protocol during bolus administration of intravenous contrast. CONTRAST:  134mL OMNIPAQUE IOHEXOL 350 MG/ML SOLN COMPARISON:  Chest radiographs earlier this day. CT abdomen/pelvis 06/11/2015 FINDINGS: CTA CHEST FINDINGS Vascular: Suboptimal contrast bolus for evaluation of pulmonary embolus, there are no filling defects in the main, right, left, or lobar pulmonary arteries. Segmental and subsegmental branches cannot be assessed, particularly in the lower  lobes where there is motion artifact. Normal caliber thoracic aorta. 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 Minimal multi chamber cardiomegaly with coronary artery calcifications. Lungs/pleura: Small bilateral pleural effusions, unchanged on the right and increased on the left from prior abdominal CT. There is adjacent compressive atelectasis at both lung bases, left greater than right. Air bronchograms on the left, with question of superimposed pneumonia. Breathing motion artifact partially obscures detailed lung evaluation. Mediastinum/lymph nodes: Prominent AP window lymph node measures 1.9 cm short axis dimension. There is otherwise no mediastinal or hilar adenopathy. No pericardial effusion. There is a right upper extremity PICC, with tip in the distal SVC. Enteric contrast within the esophagus which is mildly tortuous. Osseous structures: Multiple sclerotic foci throughout bilateral ribs, thoracic spine, and sternum consistent with known osseous metastatic disease. CT ABDOMEN and PELVIS FINDINGS Liver: No focal hepatic lesion. Hepatobiliary: Gallbladder physiologically distended. No calcified gallstone. Pancreas: No ductal dilatation or inflammation. Spleen: Normal. Adrenal glands: No nodule. Kidneys: Symmetric renal enhancement and excretion. No hydronephrosis. Bilateral renal cysts again seen. Stomach/Bowel: Stomach physiologically distended with enteric contrast, there is wall thickening involving the fundus. Fat density lesion involving the distal gastric body measures 1.6 x 1.4 cm, unchanged, query intramural lipoma. Additional fat density lesion in the region of pylorus is also unchanged. Contrast filled prominent proximal small bowel loops without transition point. Fluid-filled and distal small bowel loops, with enteric chain sutures in the right lower quadrant. Colon is tortuous with colonic diverticulosis, no diverticulitis. Appendix remains in  situ. Vascular/Lymphatic: No retroperitoneal adenopathy. Abdominal aorta is normal in caliber. Atherosclerosis of the abdominal aorta and its branches. Reproductive: Prostate gland is enlarged causing mass effect on the bladder base. Bladder: Minimally distended with mild wall thickening, likely related to degree of distention. Other: Post laparotomy with midline skin staples. No subcutaneous fluid collection. No pneumoperitoneum or free air in the abdomen. Small amount of dependent free fluid the pelvis in mesentery. Whole body wall edema most consistent with third-spacing. Scattered foci of air in the anterior abdominal wall likely related  subcutaneous injections. Musculoskeletal: Diffuse multifocal scleroses throughout the included skeleton consistent with osseous metastatic disease. Review of the MIP images confirms the above findings. IMPRESSION: 1. Suboptimal contrast bolus for evaluation of pulmonary embolus, allowing for this, no central pulmonary embolus is seen involving the central and lobar pulmonary arteries. 2. Bilateral pleural effusions, increased on the left from prior. There is associated compressive atelectasis bilaterally, the presence of air bronchograms on the left may reflect superimposed pneumonia. 3. Prominent contrast and fluid-filled small bowel without transition point, suspect postoperative ileus. Recurrent small bowel obstruction is felt less likely. 4. Additional chronic findings as described. Electronically Signed   By: Jeb Levering M.D.   On: 06/19/2015 19:58   Ct Abdomen Pelvis W Contrast  06/19/2015  CLINICAL DATA:  Inpatient hospitalized with central abdominal cramping and constipation. Post laparotomy and bowel resection 1 week prior. Today with fever and tachycardia. Patient with history of prostate cancer with bony metastasis. EXAM: CT ANGIOGRAPHY CHEST CT ABDOMEN AND PELVIS WITH CONTRAST TECHNIQUE: Multidetector CT imaging of the chest was performed using the standard  protocol during bolus administration of intravenous contrast. Multiplanar CT image reconstructions and MIPs were obtained to evaluate the vascular anatomy. Multidetector CT imaging of the abdomen and pelvis was performed using the standard protocol during bolus administration of intravenous contrast. CONTRAST:  116mL OMNIPAQUE IOHEXOL 350 MG/ML SOLN COMPARISON:  Chest radiographs earlier this day. CT abdomen/pelvis 06/11/2015 FINDINGS: CTA CHEST FINDINGS Vascular: Suboptimal contrast bolus for evaluation of pulmonary embolus, there are no filling defects in the main, right, left, or lobar pulmonary arteries. Segmental and subsegmental branches cannot be assessed, particularly in the lower lobes where there is motion artifact. Normal caliber thoracic aorta. 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 Minimal multi chamber cardiomegaly with coronary artery calcifications. Lungs/pleura: Small bilateral pleural effusions, unchanged on the right and increased on the left from prior abdominal CT. There is adjacent compressive atelectasis at both lung bases, left greater than right. Air bronchograms on the left, with question of superimposed pneumonia. Breathing motion artifact partially obscures detailed lung evaluation. Mediastinum/lymph nodes: Prominent AP window lymph node measures 1.9 cm short axis dimension. There is otherwise no mediastinal or hilar adenopathy. No pericardial effusion. There is a right upper extremity PICC, with tip in the distal SVC. Enteric contrast within the esophagus which is mildly tortuous. Osseous structures: Multiple sclerotic foci throughout bilateral ribs, thoracic spine, and sternum consistent with known osseous metastatic disease. CT ABDOMEN and PELVIS FINDINGS Liver: No focal hepatic lesion. Hepatobiliary: Gallbladder physiologically distended. No calcified gallstone. Pancreas: No ductal dilatation or inflammation. Spleen: Normal.  Adrenal glands: No nodule. Kidneys: Symmetric renal enhancement and excretion. No hydronephrosis. Bilateral renal cysts again seen. Stomach/Bowel: Stomach physiologically distended with enteric contrast, there is wall thickening involving the fundus. Fat density lesion involving the distal gastric body measures 1.6 x 1.4 cm, unchanged, query intramural lipoma. Additional fat density lesion in the region of pylorus is also unchanged. Contrast filled prominent proximal small bowel loops without transition point. Fluid-filled and distal small bowel loops, with enteric chain sutures in the right lower quadrant. Colon is tortuous with colonic diverticulosis, no diverticulitis. Appendix remains in situ. Vascular/Lymphatic: No retroperitoneal adenopathy. Abdominal aorta is normal in caliber. Atherosclerosis of the abdominal aorta and its branches. Reproductive: Prostate gland is enlarged causing mass effect on the bladder base. Bladder: Minimally distended with mild wall thickening, likely related to degree of distention. Other: Post laparotomy with midline skin staples. No  subcutaneous fluid collection. No pneumoperitoneum or free air in the abdomen. Small amount of dependent free fluid the pelvis in mesentery. Whole body wall edema most consistent with third-spacing. Scattered foci of air in the anterior abdominal wall likely related subcutaneous injections. Musculoskeletal: Diffuse multifocal scleroses throughout the included skeleton consistent with osseous metastatic disease. Review of the MIP images confirms the above findings. IMPRESSION: 1. Suboptimal contrast bolus for evaluation of pulmonary embolus, allowing for this, no central pulmonary embolus is seen involving the central and lobar pulmonary arteries. 2. Bilateral pleural effusions, increased on the left from prior. There is associated compressive atelectasis bilaterally, the presence of air bronchograms on the left may reflect superimposed pneumonia. 3.  Prominent contrast and fluid-filled small bowel without transition point, suspect postoperative ileus. Recurrent small bowel obstruction is felt less likely. 4. Additional chronic findings as described. Electronically Signed   By: Jeb Levering M.D.   On: 06/19/2015 19:58   Ct Abdomen Pelvis W Contrast  06/11/2015  ADDENDUM REPORT: 06/11/2015 12:31 ADDENDUM: The salient findings were discussed with Zenovia Jarred on 06/11/2015 at 12:31 pm. Electronically Signed   By: Nolon Nations M.D.   On: 06/11/2015 12:31  06/11/2015  CLINICAL DATA:  abdominal pain awoke him this am. Denies n/v/d or fevers. No BM since this past Sunday. History of prostate cancer. ^156mL OMNIPAQUE IOHEXOL 300 MG/ML SOLN EXAM: CT ABDOMEN AND PELVIS WITH CONTRAST TECHNIQUE: Multidetector CT imaging of the abdomen and pelvis was performed using the standard protocol following bolus administration of intravenous contrast. CONTRAST:  185mL OMNIPAQUE IOHEXOL 300 MG/ML  SOLN COMPARISON:  Lower chest: There bilateral pleural effusions. Bibasilar atelectasis. Coronary artery calcifications are present. Heart size appears normal. Upper abdomen: No focal abnormality identified within the liver, spleen, pancreas, or adrenal glands. Gallbladder is present. Bilateral renal cysts are present. No intrarenal or ureteral stones. Gastrointestinal tract: There are numerous dilated proximal small bowel loops contain contrast. In the left central lower abdomen there is a non-opacified loop of small bowel with surrounding mesenteric fluid. Asleep is felt to be the point of obstruction likely represents a closed loop obstruction. The small bowel loops distal to this level are relatively collapsed. There are numerous colonic diverticula but no evidence for acute diverticulitis. The colon is redundant. The appendix is well seen and has a normal appearance. Pelvis: The prostate is enlarged. Urinary bladder otherwise has a normal appearance. No free pelvic  fluid. No pelvic adenopathy. Retroperitoneum: Atherosclerotic calcification identified within the aorta. Abdominal wall: Fat containing umbilical hernia. Osseous structures: Interval progression of significant osseous metastatic disease.2 diffuse sclerotic metastases involve the pelvis, proximal femurs, and all of the visualized vertebrae. FINDINGS: 1. Findings consistent with closed loop obstruction of the mid to distal small bowel. Given the presence of surrounding mesenteric fluid, perforation should be considered. There is no free intraperitoneal air however. 2. Coronary artery disease. 3. Normal appendix. 4. Colonic diverticulosis. 5. Prostatic enlargement. 6. Umbilical hernia. 7. Progression of diffuse sclerotic metastases. Electronically Signed: By: Nolon Nations M.D. On: 06/11/2015 12:21   Dg Chest Port 1 View  06/25/2015  CLINICAL DATA:  Respiratory failure. EXAM: PORTABLE CHEST 1 VIEW COMPARISON:  06/24/2015. FINDINGS: Interim extubation and removal of NG tube . Right PICC line, left IJ line stable position. Cardiomegaly. Persistent interstitial prominence, interstitial edema cannot be excluded. Persistent low lung volumes with right mid lung field and left base atelectasis and/or infiltrates. No pleural effusion or pneumothorax. IMPRESSION: 1. Interim extubation removal of NG tube. Left IJ line and  right PICC line stable position. 2. Persistent cardiomegaly pulmonary interstitial prominence suggesting interstitial edema. No interim change. 3. Persistent low lung volumes with right mid lung and left base subsegmental atelectasis. Electronically Signed   By: Varina   On: 06/25/2015 07:18   Dg Chest Port 1 View  06/24/2015  CLINICAL DATA:  Respiratory failure. EXAM: PORTABLE CHEST 1 VIEW COMPARISON:  06/23/2015. FINDINGS: Endotracheal tube, NG tube, left IJ line, right PICC line in stable position. Mediastinum hilar structures are stable. Cardiomegaly with diffuse bilateral pulmonary  interstitial prominence consistent with congestive heart failure. Pneumonitis cannot be excluded. Persistent right mid lung and left base subsegmental atelectasis. No prominent pleural effusion. No pneumothorax. IMPRESSION: 1. Lines and tubes in stable position. 2. Persistent cardiomegaly. Interim appearance of bilateral from interstitial prominence. Findings suggest congestive heart failure. 3. Persistent low lung volumes with persistent right mid lung and left base subsegmental atelectasis. Electronically Signed   By: [name]  Register   On: 06/24/2015 07:18   Dg Chest Port 1 View  06/23/2015  CLINICAL DATA:  Central line placement EXAM: PORTABLE CHEST 1 VIEW COMPARISON:  06/23/2015 FINDINGS: Left internal jugular central line has been placed with the tip in the upper SVC. No pneumothorax. Endotracheal tube is 3 cm above the carina. NG tube is in the stomach. Right PICC line is unchanged. Patchy right upper lobe and left lower lobe airspace opacities are again noted, unchanged. Low lung volumes. IMPRESSION: Left central line placement with the tip in the upper SVC. No pneumothorax. Otherwise no significant change. Electronically Signed   By: Rolm Baptise M.D.   On: 06/23/2015 17:15   Dg Chest Port 1 View  06/23/2015  CLINICAL DATA:  ET tube repositioning EXAM: PORTABLE CHEST 1 VIEW COMPARISON:  06/23/2015 FINDINGS: Endotracheal tube has been retracted and is approximately 2 cm above the carina. Right PICC line is unchanged as is the NG tube. There is mild cardiomegaly. Left lower lobe atelectasis or infiltrate noted. Patchy right upper lobe opacity also noted. These are unchanged since prior study. IMPRESSION: Interval retraction of the endotracheal tube, now approximately 2 cm above the carina. Stable patchy right upper lobe and left lower lobe airspace opacities. Electronically Signed   By: Rolm Baptise M.D.   On: 06/23/2015 16:29   Dg Chest Port 1 View  06/23/2015  CLINICAL DATA:  Status post  endotracheal intubation EXAM: PORTABLE CHEST - 1 VIEW COMPARISON:  06/23/2015 FINDINGS: Cardiac shadow is stable. Some mild atelectatic changes remain in the left lung base and right mid lung. Nasogastric catheter is been placed but is coiled at the level of the diaphragm in may lie within a hiatal hernia. A right-sided PICC line is noted at the cavoatrial junction. An endotracheal tube is seen at the level of the carina directing slightly into the right mainstem bronchus. This should be withdrawn approximately 4 cm. No other focal abnormality is noted. IMPRESSION: Tubes and lines as described above. The endotracheal tube should be withdrawn 4 cm. Bilateral patchy atelectatic changes. These results were called by telephone at the time of interpretation on 06/23/2015 at 2:14 pm to [facility], the patients nurse Who verbally acknowledged these results. Electronically Signed   By: Inez Catalina M.D.   On: 06/23/2015 14:19   Dg Chest Port 1 View  06/23/2015  CLINICAL DATA:  Acute onset of shortness of breath and decreased O2 saturation. Initial encounter. EXAM: PORTABLE CHEST 1 VIEW COMPARISON:  Chest radiograph and CT of the chest performed 06/19/2015 FINDINGS: The  lungs are mildly hypoexpanded. Left basilar airspace opacity raises concern for pneumonia. There is no evidence of pleural effusion or pneumothorax. The cardiomediastinal silhouette is borderline normal in size. No acute osseous abnormalities are seen. IMPRESSION: Lungs mildly hypoexpanded. Left basilar airspace opacity is concerning for pneumonia. Electronically Signed   By: Garald Balding M.D.   On: 06/23/2015 05:17   Portable Chest Xray - Atelectasis  06/12/2015  CLINICAL DATA:  Aspiration of vomitus, initial encounter. EXAM: PORTABLE CHEST 1 VIEW COMPARISON:  Nov 09, 2014. FINDINGS: Stable cardiomediastinal silhouette. Hypoinflation of the lungs is noted. Nasogastric tube tip is seen in proximal stomach. No pneumothorax is noted. Mild central pulmonary  vascular congestion is noted. Small amount of fluid is noted in the right minor fissure. Left basilar opacity is noted concerning for pneumonia or atelectasis with associated pleural effusion. Bony thorax is unremarkable. IMPRESSION: Hypoinflation of the lungs is noted. Mild central pulmonary vascular congestion is noted. Left basilar opacity is noted concerning for pneumonia or atelectasis with associated pleural effusion. Electronically Signed   By: Marijo Conception, M.D.   On: 06/12/2015 16:43   Dg Abd Portable 1v  06/12/2015  CLINICAL DATA:  Nasogastric tube placement EXAM: PORTABLE ABDOMEN - 1 VIEW COMPARISON:  Yesterday FINDINGS: Advancement of nasogastric tube with tip in the peripatellar region. Unchanged dilation of small bowel. There is a new lucency over the epigastrium and right abdomen which may outline the falciform ligament and the outer wall of a small bowel loop. Critical Value/emergent results were called by telephone at the time of interpretation on 06/12/2015 at 8:07 am to Dr. Saverio Danker , who verbally acknowledged these results. IMPRESSION: 1. Possible new pneumoperitoneum. Depending on exam, confirmation could be obtained by CT or upright/decubitus imaging. 2. Advanced nasogastric tube with tip at the pylorus. 3. Unchanged small bowel distention. Electronically Signed   By: Monte Fantasia M.D.   On: 06/12/2015 08:08   Dg Abd Portable 1v-small Bowel Obstruction Protocol-initial, 8 Hr Delay  06/11/2015  CLINICAL DATA:  NG tube placement EXAM: PORTABLE ABDOMEN - 1 VIEW COMPARISON:  CT of the abdomen and pelvis from the same day. FINDINGS: Side port of an NG tube is just beyond the GE junction. Lung bases are clear. Multiple dilated loops of small bowel are again seen. IMPRESSION: 1. The side port of the NG tube is just beyond the GE junction and could be advanced several cm for more optimal positioning. 2. Persistent dilation of multiple loops of small bowel. Electronically Signed   By:  San Morelle M.D.   On: 06/11/2015 17:17      ASSESSMENT AND PLAN: 1. Acute on chronic diastolic heart failure: Diuresed approx 3 L in past 24 hrs on Lasix IV 40 mg bid. BUN/SCr stable (BUN 38-->33, SCr 1.23). Will reduce to 20 mg bid.  2. Sinus tachycardia: Continue IV metoprolol 10 mg q 8 hrs until fully able to tolerate pills. He prefers IV today as well due to abdominal discomfort.   Kate Sable, M.D., F.A.C.C.

## 2015-06-27 NOTE — Progress Notes (Signed)
15 Days Post-Op  Subjective: Vomited this am. Pt and family are unhappy with communication  Objective: Vital signs in last 24 hours: Temp:  [97.4 F (36.3 C)-98.4 F (36.9 C)] 98.4 F (36.9 C) (12/17 0400) Pulse Rate:  [81-103] 81 (12/17 0600) Resp:  [13-20] 20 (12/17 0600) BP: (138-176)/(61-81) 148/72 mmHg (12/17 0600) SpO2:  [95 %-100 %] 99 % (12/17 0600) Weight:  [97.2 kg (214 lb 4.6 oz)] 97.2 kg (214 lb 4.6 oz) (12/17 0500) Last BM Date: 06/23/15  Intake/Output from previous day: 12/16 0701 - 12/17 0700 In: 1164.2 [P.O.:240; I.V.:669.2; IV Piggyback:255] Out: 3670 [Urine:3170; Emesis/NG output:500] Intake/Output this shift: Total I/O In: -  Out: 200 [Emesis/NG output:200]  Resp: clear to auscultation bilaterally Cardio: regular rate and rhythm GI: soft, distended. good bs but nauseated and vomiting. incision looks good  Lab Results:   Recent Labs  06/26/15 0500  WBC 17.4*  HGB 8.3*  HCT 25.1*  PLT 383   BMET  Recent Labs  06/26/15 0500 06/27/15 0545  NA 140 143  K 4.0 3.5  CL 107 110  CO2 21* 23  GLUCOSE 175* 240*  BUN 38* 33*  CREATININE 1.25* 1.23  CALCIUM 7.0* 6.7*   PT/INR No results for input(s): LABPROT, INR in the last 72 hours. ABG No results for input(s): PHART, HCO3 in the last 72 hours.  Invalid input(s): PCO2, PO2  Studies/Results: No results found.  Anti-infectives: Anti-infectives    Start     Dose/Rate Route Frequency Ordered Stop   06/23/15 1700  vancomycin (VANCOCIN) IVPB 1000 mg/200 mL premix  Status:  Discontinued     1,000 mg 200 mL/hr over 60 Minutes Intravenous Every 12 hours 06/23/15 1414 06/26/15 1000   06/23/15 1430  piperacillin-tazobactam (ZOSYN) IVPB 3.375 g     3.375 g 12.5 mL/hr over 240 Minutes Intravenous Every 8 hours 06/23/15 1416 06/29/15 2359   06/23/15 1415  piperacillin-tazo (ZOSYN) NICU IV syringe 200 mg/mL  Status:  Discontinued     75 mg/kg  105.4 kg 79 mL/hr over 30 Minutes Intravenous Every 8  hours 06/23/15 1401 06/23/15 1416   06/23/15 1000  levofloxacin (LEVAQUIN) tablet 750 mg  Status:  Discontinued     750 mg Oral Daily 06/23/15 0750 06/23/15 1401   06/22/15 1600  vancomycin (VANCOCIN) IVPB 1000 mg/200 mL premix  Status:  Discontinued     1,000 mg 200 mL/hr over 60 Minutes Intravenous Every 12 hours 06/22/15 1346 06/23/15 0740   06/20/15 0200  vancomycin (VANCOCIN) 1,250 mg in sodium chloride 0.9 % 250 mL IVPB  Status:  Discontinued     1,250 mg 166.7 mL/hr over 90 Minutes Intravenous Every 12 hours 06/19/15 1314 06/22/15 1346   06/19/15 1400  vancomycin (VANCOCIN) 2,000 mg in sodium chloride 0.9 % 500 mL IVPB     2,000 mg 250 mL/hr over 120 Minutes Intravenous  Once 06/19/15 1313 06/19/15 1737   06/19/15 1315  piperacillin-tazobactam (ZOSYN) IVPB 3.375 g  Status:  Discontinued     3.375 g 12.5 mL/hr over 240 Minutes Intravenous Every 8 hours 06/19/15 1305 06/23/15 0740   06/19/15 1100  cefTRIAXone (ROCEPHIN) 2 g in dextrose 5 % 50 mL IVPB  Status:  Discontinued     2 g 100 mL/hr over 30 Minutes Intravenous Every 24 hours 06/19/15 1020 06/19/15 1246   06/12/15 0930  cefoTEtan (CEFOTAN) 2 g in dextrose 5 % 50 mL IVPB     2 g 100 mL/hr over 30 Minutes Intravenous On call  to O.R. 06/12/15 [id number] 06/12/15 1359      Assessment/Plan: s/p Procedure(s): EXPLORATORY LAPAROTOMY (N/A) SMALL BOWEL RESECTION (N/A) will place ng for persistent ileus  Correct lytes and start tpn for nutrition support Continue PT and OT OOB Appreciate CCM assistance  LOS: 16 days    TOTH III,Rhina Kramme S 06/27/2015

## 2015-06-27 NOTE — Progress Notes (Signed)
Spoke with Dr. Lucia Gaskins re: pt vomiting large amount of bile.  Orders received for NG placement and obtain a flat and upright xray 4 hours after insertion.  Pt refused insertion, called daughter to discuss with her.  Daughter on way to talk it over with pt. Fara Olden P

## 2015-06-27 NOTE — Progress Notes (Signed)
[facility] Progress Note Patient Name: Jeremy Jennings DOB: April 24, 1936 MRN: [id number]   Date of Service  06/27/2015  HPI/Events of Note  HTN - BP = 162/77. Patient c/o pain.    eICU Interventions  Will increase Fentanyl to 50 mcg IV Q 1 hour PRN pain.      Intervention Category Intermediate Interventions: Hypertension - evaluation and management;Pain - evaluation and management  Sommer,Steven Eugene 06/27/2015, 7:57 PM

## 2015-06-27 NOTE — Progress Notes (Signed)
Pt's daughter here to discuss NG placement.  Pt would like to move to [facility], daughter would like pt to move to [facility] because she lives in [place].  Pt and daughter refusing NG at present and would like to wait for MD morning rounds to discuss. Fara Olden P

## 2015-06-27 NOTE — Progress Notes (Signed)
Pt's daughter asking to speak with MD now instead of waiting for rounds. Explained that  MD on call now will be going off call in 10 minutes but I will page dayshift MD as soon as pager is operating in 10 minutes at 0700.  Fara Olden P

## 2015-06-27 NOTE — Progress Notes (Addendum)
PULMONARY / CRITICAL CARE MEDICINE   Name: Jeremy Jennings MRN: [id number] DOB: 04-Nov-1935    ADMISSION DATE:  06/11/2015 CONSULTATION DATE:  06/23/15  REFERRING MD :  CCS  CHIEF COMPLAINT:  Acute respiratory failure, cardiac arrest  INITIAL PRESENTATION: Admitted on 06/11/15 with abdominal pain. CT scan concerning for small bowel obstruction. Underwent ex-lap and small bowel resection on 06/12/15. Postoperative course notable for sinus tachycardia. Increasing respiratory distress on 12/13. Intubated and had a brief code.  STUDIES:  LE Dopplers 12/9 >> negative for DVT CT scan chest 12/9>> negative for PE, bibasilar pleural effusions and atelectasis Echo 12/9 >>LVEF [id number], mid-systolic LV cavitary obliteration, moderate LVH, upper normal LA size, mild TR, RVSP 34 mmHg + RAP (IVC not visualized). Chest x-ray 12/13 >> left basilar opacity, concerning for pneumonia.  SIGNIFICANT EVENTS: 12/2 >> exploratory laparoscopy, small bowel resection 12/13 >> PEA arrest, intubated 12/14 >> self extubated    SUBJECTIVE:  Notes more nausea and emesis, daughter here and concerned. Interested in transfer to [facility].  Pt denies chest pain.  Notes no resp issues.  Not able to take PO.  Not eating Albumen lower.   VITAL SIGNS: RA   Temp:  [97.4 F (36.3 C)-98.4 F (36.9 C)] 98.4 F (36.9 C) (12/17 0400) Pulse Rate:  [81-103] 81 (12/17 0600) Resp:  [13-20] 20 (12/17 0600) BP: (138-176)/(61-81) 148/72 mmHg (12/17 0600) SpO2:  [95 %-100 %] 99 % (12/17 0600) Weight:  [97.2 kg (214 lb 4.6 oz)] 97.2 kg (214 lb 4.6 oz) (12/17 0500) HEMODYNAMICS:      INTAKE / OUTPUT:  Intake/Output Summary (Last 24 hours) at 06/27/15 [id number] Last data filed at 06/27/15 0600  Gross per 24 hour  Intake 1164.17 ml  Output   3670 ml  Net -2505.83 ml    PHYSICAL EXAMINATION: General:  Awake, oriented Neuro:  PERRLA, no gross focal deficits HEENT:  Moist mucous membranes Cardiovascular:  Regular rate and rhythm, no  MRG  Edema LE and scrotum but better  Lungs:  No wheeze, no crackles Abdomen:  Distended, active BS, passing gas.  Tender epigastric area  Skin:  Intact  LABS:  CBC  Recent Labs Lab 06/23/15 1311 06/24/15 0400 06/26/15 0500  WBC 10.9* 12.5* 17.4*  HGB 8.0* 7.5* 8.3*  HCT 25.5* 23.0* 25.1*  PLT 250 224 383   Coag's  Recent Labs Lab 06/22/15 0515 06/23/15 1311  INR 1.24 1.30   BMET  Recent Labs Lab 06/25/15 0530 06/26/15 0500 06/27/15 0545  NA 141 140 143  K 3.7 4.0 3.5  CL 107 107 110  CO2 21* 21* 23  BUN 30* 38* 33*  CREATININE 1.07 1.25* 1.23  GLUCOSE 185* 175* 240*   Electrolytes  Recent Labs Lab 06/22/15 0515 06/23/15 0450  06/24/15 0400 06/25/15 0530 06/26/15 0500 06/27/15 0545  CALCIUM 6.3* 6.5*  < > 6.7* 6.9* 7.0* 6.7*  MG 1.9 1.9  --  2.3  --   --   --   PHOS 2.0* 2.5  --  2.7  --   --   --   < > = values in this interval not displayed. Sepsis Markers  Recent Labs Lab 06/23/15 1311 06/24/15 0400 06/25/15 0530 06/26/15 0500  LATICACIDVEN 1.6  --   --   --   PROCALCITON  --  1.75 1.31 0.83   ABG  Recent Labs Lab 06/23/15 1033 06/23/15 1600  PHART 7.379 7.347*  PCO2ART 33.0* 33.6*  PO2ART 91.6 321*   Liver Enzymes  Recent  Labs Lab 06/22/15 0515 06/24/15 0400  AST 46* 61*  ALT 21 20  ALKPHOS 327* 655*  BILITOT 0.5 0.5  ALBUMIN 1.5* 1.6*   Cardiac Enzymes  Recent Labs Lab 06/23/15 1311  TROPONINI <0.03   Glucose  Recent Labs Lab 06/25/15 2350 06/26/15 0433 06/26/15 0738 06/26/15 1145 06/26/15 1629 06/26/15 2138  GLUCAP 161* 170* 166* 261* 194* 211*    Imaging No results found. ASSESSMENT / PLAN:  79 year old metastatic prostate cancer. Status post exploratory laparoscopy with bowel resection for small bowel obstruction. Intubated for acute respiratory failure with wheezing and increased work of breathing. Brief PEA arrest  Being Rx for copd exac. On zosyn.  Pt with persistent emesis,    PULMONARY OETT 12/13 >> 12/14 A: Atelectasis HCAP ? NOS   PCT normal. No fever , WBC higher No hx of copd.   No PE P:   Wean down FiO2 as tolerated D/c nebs and steroids IS  CARDIOVASCULAR CVL A:  Sinus tachycardia Diastolic heart failure P:  Continue Lasix 40 IV bid. Cont IV metoprolol, cannot take po cardiazem , may be affecting GI function   RENAL A:   Stable P:   Monitor BUN/creatinine, urine output Repeat lytes as indicated  GASTROINTESTINAL A:   Small bowel obstruction with hernia Status post exploratory laparoscopy, small bowel resection Postop ileus Protein calorie malnutrition. Severe Was getting 1gm tylenol tid. P:   Replace NG Start TPN  Recheck LFTs,  Stop tylenol chck tylenol levels pepcid for SUP  HEMATOLOGIC A: Stable, anemia crit illness P:  Monitor DVT proph with hep sq  INFECTIOUS A:  HCAP Enterococcus UTI. P:   BCx2 >> UC 12/9 >> Enterococcus Sputum  12/13 >> NGTD Abx: [place], start date 12/13 >> 12/16 Zosyn, start date 12/13 >>   All cultures are negative, Procalcitonin is improving. . Continue Zosyn for 7 days. End date 12/19, vanco is off  Follow sputum cultures,   ENDOCRINE A:  Diabetes mellitus P:   Continue Lantus 16 U Changes SSI to npo status  NEUROLOGIC A:   Pain control adequate  P:   Prn fentanyl  FAMILY  - Updates: Daughter updated at bedside 12/17, she wanted him tfr to [facility] , mainly for convenience but was interested in second opinion.  She is willing to wait to see how he does over the weekend.  - Inter-disciplinary family meet or Palliative Care meeting due by: 12/20.  TODAY'S SUMMARY: Recurrent ileus, NG needs to be replaced.  Pt with ongoing issues with ? HCAP,  volume excess, severe Prot cal malnutrition, not eating.  With NPO status and Alb 1.6 and low prealbumin will resume TPN.  Get off cardiazem as may contribute to ileus.No hx or evidence for copd so get off nebs and steroids Also getting  high  Dose tylenol>>chk LFTs and stop tylenol and chk levels  CC time 51min  Glyn Ade  [phone]  Cell  [phone]  If no response or cell goes to voicemail, call beeper [phone] 8:47 AM 06/27/2015

## 2015-06-28 ENCOUNTER — Inpatient Hospital Stay (HOSPITAL_COMMUNITY): Payer: Medicare Other

## 2015-06-28 DIAGNOSIS — T17910A Gastric contents in respiratory tract, part unspecified causing asphyxiation, initial encounter: Secondary | ICD-10-CM

## 2015-06-28 LAB — COMPREHENSIVE METABOLIC PANEL
ALK PHOS: 538 U/L — AB (ref 38–126)
ALT: 19 U/L (ref 17–63)
ANION GAP: 9 (ref 5–15)
AST: 27 U/L (ref 15–41)
Albumin: 1.9 g/dL — ABNORMAL LOW (ref 3.5–5.0)
BILIRUBIN TOTAL: 0.7 mg/dL (ref 0.3–1.2)
BUN: 24 mg/dL — AB (ref 6–20)
CALCIUM: 6.8 mg/dL — AB (ref 8.9–10.3)
CO2: 26 mmol/L (ref 22–32)
CREATININE: 1.01 mg/dL (ref 0.61–1.24)
Chloride: 112 mmol/L — ABNORMAL HIGH (ref 101–111)
GFR calc non Af Amer: >60 mL/min (ref >60)
GLUCOSE: 75 mg/dL (ref 65–99)
POTASSIUM: 3 mmol/L — AB (ref 3.5–5.1)
SODIUM: 147 mmol/L — AB (ref 135–145)
TOTAL PROTEIN: 5.4 g/dL — AB (ref 6.5–8.1)

## 2015-06-28 LAB — CBC WITH DIFFERENTIAL/PLATELET
BASOS ABS: 0 10*3/uL (ref 0.0–0.1)
Basophils Relative: 0 %
Eosinophils Absolute: 0.1 10*3/uL (ref 0.0–0.7)
Eosinophils Relative: 1 %
HCT: 27.5 % — ABNORMAL LOW (ref 39.0–52.0)
HEMOGLOBIN: 8.9 g/dL — AB (ref 13.0–17.0)
LYMPHS ABS: 1.1 10*3/uL (ref 0.7–4.0)
Lymphocytes Relative: 8 %
MCH: 29 pg (ref 26.0–34.0)
MCHC: 32.4 g/dL (ref 30.0–36.0)
MCV: 89.6 fL (ref 78.0–100.0)
MONO ABS: 0.9 10*3/uL (ref 0.1–1.0)
MONOS PCT: 7 %
NEUTROS ABS: 11.1 10*3/uL — AB (ref 1.7–7.7)
Neutrophils Relative %: 84 %
Platelets: 360 10*3/uL (ref 150–400)
RBC: 3.07 MIL/uL — ABNORMAL LOW (ref 4.22–5.81)
RDW: 16.3 % — AB (ref 11.5–15.5)
WBC Morphology: INCREASED
WBC: 13.2 10*3/uL — AB (ref 4.0–10.5)

## 2015-06-28 LAB — GLUCOSE, CAPILLARY
GLUCOSE-CAPILLARY: 82 mg/dL (ref 65–99)
GLUCOSE-CAPILLARY: 90 mg/dL (ref 65–99)
Glucose-Capillary: 94 mg/dL (ref 65–99)
Glucose-Capillary: 82 mg/dL (ref 65–99)
Glucose-Capillary: 133 mg/dL — ABNORMAL HIGH (ref 65–99)
Glucose-Capillary: 91 mg/dL (ref 65–99)

## 2015-06-28 LAB — PREALBUMIN: Prealbumin: 17.9 mg/dL — ABNORMAL LOW (ref 18–38)

## 2015-06-28 LAB — TRIGLYCERIDES: TRIGLYCERIDES: 150 mg/dL — AB (ref <150)

## 2015-06-28 LAB — PHOSPHORUS: Phosphorus: 2.3 mg/dL — ABNORMAL LOW (ref 2.5–4.6)

## 2015-06-28 LAB — MAGNESIUM: Magnesium: 1.9 mg/dL (ref 1.7–2.4)

## 2015-06-28 MED ORDER — POTASSIUM PHOSPHATES 15 MMOLE/5ML IV SOLN
10.0000 mmol | Freq: Once | INTRAVENOUS | Status: AC
  Administered 2015-06-28: 10 mmol via INTRAVENOUS
  Filled 2015-06-28: qty 3.33

## 2015-06-28 MED ORDER — TRACE MINERALS CR-CU-MN-SE-ZN 10-1000-500-60 MCG/ML IV SOLN
INTRAVENOUS | Status: AC
  Administered 2015-06-28: 17:00:00 via INTRAVENOUS
  Filled 2015-06-28: qty 1680

## 2015-06-28 MED ORDER — DEXTROSE 5 % IV SOLN
INTRAVENOUS | Status: AC
  Administered 2015-06-28: 13:00:00 via INTRAVENOUS

## 2015-06-28 MED ORDER — SODIUM CHLORIDE 0.9 % IV SOLN
1.0000 g | Freq: Once | INTRAVENOUS | Status: AC
  Administered 2015-06-28: 1 g via INTRAVENOUS
  Filled 2015-06-28: qty 10

## 2015-06-28 MED ORDER — FUROSEMIDE 10 MG/ML IJ SOLN
20.0000 mg | Freq: Every day | INTRAMUSCULAR | Status: DC
Start: 2015-06-28 — End: 2015-07-02
  Administered 2015-06-28 – 2015-07-02 (×5): 20 mg via INTRAVENOUS
  Filled 2015-06-28 (×4): qty 2

## 2015-06-28 MED ORDER — HYDRALAZINE HCL 20 MG/ML IJ SOLN
10.0000 mg | INTRAMUSCULAR | Status: DC | PRN
Start: 2015-06-28 — End: 2015-06-28
  Administered 2015-06-28: 10 mg via INTRAVENOUS
  Filled 2015-06-28: qty 1

## 2015-06-28 MED ORDER — TRACE MINERALS CR-CU-MN-SE-ZN 10-1000-500-60 MCG/ML IV SOLN
INTRAVENOUS | Status: DC
  Filled 2015-06-28: qty 1680

## 2015-06-28 MED ORDER — INSULIN GLARGINE 100 UNIT/ML ~~LOC~~ SOLN
10.0000 [IU] | Freq: Every day | SUBCUTANEOUS | Status: DC
  Administered 2015-06-28 – 2015-07-02 (×5): 10 [IU] via SUBCUTANEOUS
  Filled 2015-06-28 (×6): qty 0.1

## 2015-06-28 MED ORDER — HYDRALAZINE HCL 20 MG/ML IJ SOLN
10.0000 mg | Freq: Three times a day (TID) | INTRAMUSCULAR | Status: DC
  Administered 2015-06-28: 10 mg via INTRAVENOUS
  Filled 2015-06-28: qty 1

## 2015-06-28 MED ORDER — POTASSIUM CHLORIDE 10 MEQ/50ML IV SOLN
10.0000 meq | INTRAVENOUS | Status: AC
  Administered 2015-06-28 (×4): 10 meq via INTRAVENOUS
  Filled 2015-06-28 (×4): qty 50

## 2015-06-28 MED ORDER — LABETALOL HCL 5 MG/ML IV SOLN
20.0000 mg | Freq: Four times a day (QID) | INTRAVENOUS | Status: DC
  Administered 2015-06-28 – 2015-07-02 (×15): 20 mg via INTRAVENOUS
  Filled 2015-06-28 (×15): qty 4

## 2015-06-28 MED ORDER — HYDRALAZINE HCL 20 MG/ML IJ SOLN
10.0000 mg | Freq: Three times a day (TID) | INTRAMUSCULAR | Status: DC
  Administered 2015-06-29: 10 mg via INTRAVENOUS
  Filled 2015-06-28: qty 1

## 2015-06-28 MED ORDER — HYDRALAZINE HCL 20 MG/ML IJ SOLN
20.0000 mg | Freq: Three times a day (TID) | INTRAMUSCULAR | Status: DC
  Administered 2015-06-28: 20 mg via INTRAVENOUS
  Filled 2015-06-28: qty 1

## 2015-06-28 NOTE — Progress Notes (Signed)
16 Days Post-Op  Subjective: Feels better today. Has had a loose bm  Objective: Vital signs in last 24 hours: Temp:  [97.4 F (36.3 C)-99.3 F (37.4 C)] 98.2 F (36.8 C) (12/18 0800) Pulse Rate:  [83-109] 89 (12/18 0600) Resp:  [13-23] 23 (12/18 0600) BP: (150-195)/(68-89) 165/71 mmHg (12/18 0600) SpO2:  [88 %-100 %] 99 % (12/18 0600) Weight:  [93.2 kg (205 lb 7.5 oz)] 93.2 kg (205 lb 7.5 oz) (12/18 0500) Last BM Date: 06/27/15  Intake/Output from previous day: 12/17 0701 - 12/18 0700 In: 684.1 [IV Piggyback:150; TPN:534.1] Out: 6050 [Urine:5050; Emesis/NG output:1000] Intake/Output this shift:    Resp: clear to auscultation bilaterally Cardio: regular rate and rhythm GI: soft, nontender. less distended. good bs. incision ok  Lab Results:   Recent Labs  06/26/15 0500 06/28/15 0527  WBC 17.4* 13.2*  HGB 8.3* 8.9*  HCT 25.1* 27.5*  PLT 383 360   BMET  Recent Labs  06/27/15 0545 06/28/15 0527  NA 143 147*  K 3.5 3.0*  CL 110 112*  CO2 23 26  GLUCOSE 240* 75  BUN 33* 24*  CREATININE 1.23 1.01  CALCIUM 6.7* 6.8*   PT/INR No results for input(s): LABPROT, INR in the last 72 hours. ABG No results for input(s): PHART, HCO3 in the last 72 hours.  Invalid input(s): PCO2, PO2  Studies/Results: Dg Chest Port 1 View  06/27/2015  CLINICAL DATA:  Shortness of breath, abdominal distension, nasogastric tube placement, hypertension, type II diabetes mellitus, prostate cancer, former smoker EXAM: PORTABLE CHEST 1 VIEW COMPARISON:  Portable exam 1023 hours compared to 06/25/2015 FINDINGS: Nasogastric tube extends into stomach. RIGHT arm PICC line tip projects over SVC. LEFT jugular central venous catheter tip projects over confluence of LEFT brachiocephalic vein and SVC. Rotated to the RIGHT. Enlargement of cardiac silhouette. Atherosclerotic calcification aorta. Bibasilar atelectasis. Improved perihilar infiltrates. No pleural effusion or pneumothorax. Gaseous distention  of bowel in LEFT upper quadrant. IMPRESSION: Bibasilar atelectasis. Enlargement of cardiac silhouette. Improved pulmonary infiltrates. Electronically Signed   By: Lavonia Dana M.D.   On: 06/27/2015 11:16   Dg Abd Portable 1v  06/27/2015  CLINICAL DATA:  Abdominal distention today.  NG tube placement. EXAM: PORTABLE ABDOMEN - 1 VIEW COMPARISON:  06/19/2015 and prior CTs. 06/12/2015 and prior radiographs. FINDINGS: An NG tube is identified with tip overlying the mid stomach. Mild gaseous distention of colon and small bowel again noted. Surgical staples overlying the abdomen and pelvis are again noted. No other significant changes noted. IMPRESSION: NG tube with tip overlying the mid stomach. Continued mild gaseous distension of small bowel and colon-question ileus. Electronically Signed   By: Margarette Canada M.D.   On: 06/27/2015 10:45    Anti-infectives: Anti-infectives    Start     Dose/Rate Route Frequency Ordered Stop   06/23/15 1700  vancomycin (VANCOCIN) IVPB 1000 mg/200 mL premix  Status:  Discontinued     1,000 mg 200 mL/hr over 60 Minutes Intravenous Every 12 hours 06/23/15 1414 06/26/15 1000   06/23/15 1430  piperacillin-tazobactam (ZOSYN) IVPB 3.375 g     3.375 g 12.5 mL/hr over 240 Minutes Intravenous Every 8 hours 06/23/15 1416 06/29/15 2359   06/23/15 1415  piperacillin-tazo (ZOSYN) NICU IV syringe 200 mg/mL  Status:  Discontinued     75 mg/kg  105.4 kg 79 mL/hr over 30 Minutes Intravenous Every 8 hours 06/23/15 1401 06/23/15 1416   06/23/15 1000  levofloxacin (LEVAQUIN) tablet 750 mg  Status:  Discontinued  750 mg Oral Daily 06/23/15 0750 06/23/15 1401   06/22/15 1600  vancomycin (VANCOCIN) IVPB 1000 mg/200 mL premix  Status:  Discontinued     1,000 mg 200 mL/hr over 60 Minutes Intravenous Every 12 hours 06/22/15 1346 06/23/15 0740   06/20/15 0200  vancomycin (VANCOCIN) 1,250 mg in sodium chloride 0.9 % 250 mL IVPB  Status:  Discontinued     1,250 mg 166.7 mL/hr over 90 Minutes  Intravenous Every 12 hours 06/19/15 1314 06/22/15 1346   06/19/15 1400  vancomycin (VANCOCIN) 2,000 mg in sodium chloride 0.9 % 500 mL IVPB     2,000 mg 250 mL/hr over 120 Minutes Intravenous  Once 06/19/15 1313 06/19/15 1737   06/19/15 1315  piperacillin-tazobactam (ZOSYN) IVPB 3.375 g  Status:  Discontinued     3.375 g 12.5 mL/hr over 240 Minutes Intravenous Every 8 hours 06/19/15 1305 06/23/15 0740   06/19/15 1100  cefTRIAXone (ROCEPHIN) 2 g in dextrose 5 % 50 mL IVPB  Status:  Discontinued     2 g 100 mL/hr over 30 Minutes Intravenous Every 24 hours 06/19/15 1020 06/19/15 1246   06/12/15 0930  cefoTEtan (CEFOTAN) 2 g in dextrose 5 % 50 mL IVPB     2 g 100 mL/hr over 30 Minutes Intravenous On call to O.R. 06/12/15 0852 06/12/15 1359      Assessment/Plan: s/p Procedure(s): EXPLORATORY LAPAROTOMY (N/A) SMALL BOWEL RESECTION (N/A) continue ng and bowel rest  Recheck abd xrays today OOB  LOS: 17 days    TOTH III,PAUL S 06/28/2015

## 2015-06-28 NOTE — Progress Notes (Signed)
eLink Physician-Brief Progress Note Patient Name: Jeremy Jennings DOB: Nov 28, 1935 MRN: [id number]   Date of Service  06/28/2015  HPI/Events of Note  Nurse reports persistent hypertension.  Patient restless but denies pain.  Presently on scheduled lopressor.  eICU Interventions  Prn hydralazine ordered     Intervention Category Major Interventions: Hypertension - evaluation and management  Mauri Brooklyn, P 06/28/2015, 2:04 AM

## 2015-06-28 NOTE — Progress Notes (Signed)
SUBJECTIVE: No change in breathing, denies shortness of breath and chest pain. NG tube in. Less abdominal discomfort today. Hypertensive.     Intake/Output Summary (Last 24 hours) at 06/28/15 0829 Last data filed at 06/28/15 0630  Gross per 24 hour  Intake  684.1 ml  Output   6050 ml  Net -5365.9 ml    Current Facility-Administered Medications  Medication Dose Route Frequency Provider Last Rate Last Dose  . Marland KitchenTPN (CLINIMIX-E) Adult   Intravenous Continuous TPN Luiz Ochoa, [facility] 42 mL/hr at 06/27/15 1717    . antiseptic oral rinse (CPC / CETYLPYRIDINIUM CHLORIDE 0.05%) solution 7 mL  7 mL Mouth Rinse BID Michael Boston, MD   7 mL at 06/27/15 2200  . bisacodyl (DULCOLAX) suppository 10 mg  10 mg Rectal Daily PRN Nat Christen, PA-C      . diphenhydrAMINE (BENADRYL) 12.5 MG/5ML elixir 12.5 mg  12.5 mg Oral Q6H PRN Saverio Danker, PA-C       Or  . diphenhydrAMINE (BENADRYL) injection 12.5 mg  12.5 mg Intravenous Q6H PRN Saverio Danker, PA-C      . famotidine (PEPCID) IVPB 20 mg premix  20 mg Intravenous Q24H Lavina Hamman, MD   20 mg at 06/27/15 1207  . fentaNYL (SUBLIMAZE) injection 50 mcg  50 mcg Intravenous Q1H PRN Anders Simmonds, MD   50 mcg at 06/28/15 0222  . furosemide (LASIX) injection 20 mg  20 mg Intravenous Q12H Herminio Commons, MD   20 mg at 06/27/15 2217  . heparin injection 5,000 Units  5,000 Units Subcutaneous 3 times per day Earnstine Regal, PA-C   5,000 Units at 06/28/15 0546  . hydrALAZINE (APRESOLINE) injection 10 mg  10 mg Intravenous Q4H PRN Mauri Brooklyn, MD   10 mg at 06/28/15 0215  . insulin aspart (novoLOG) injection 0-20 Units  0-20 Units Subcutaneous Q4H Elsie Stain, MD   4 Units at 06/27/15 1205  . insulin glargine (LANTUS) injection 16 Units  16 Units Subcutaneous QHS Marshell Garfinkel, MD   16 Units at 06/27/15 2218  . lip balm (CARMEX) ointment 1 application  1 application Topical BID Michael Boston, MD   1 application at [id number] [phone]  . magic  mouthwash  15 mL Oral QID PRN Michael Boston, MD      . methocarbamol (ROBAXIN) 500 mg in dextrose 5 % 50 mL IVPB  500 mg Intravenous Q8H PRN Lavina Hamman, MD   500 mg at 06/27/15 0104  . metoprolol (LOPRESSOR) injection 10 mg  10 mg Intravenous 3 times per day Lonn Georgia, PA-C   10 mg at 06/28/15 0546  . nutrition supplement (JUVEN) (JUVEN) powder packet 1 packet  1 packet Oral BID BM Clayton Bibles, RD   1 packet at 06/26/15 1400  . ondansetron (ZOFRAN) injection 4 mg  4 mg Intravenous Q4H PRN Elsie Stain, MD   4 mg at 06/27/15 0848  . piperacillin-tazobactam (ZOSYN) IVPB 3.375 g  3.375 g Intravenous Q8H Praveen Mannam, MD   3.375 g at 06/28/15 0547  . potassium chloride 10 mEq in 50 mL *CENTRAL LINE* IVPB  10 mEq Intravenous Q1 Hr x 4 Autumn Messing III, MD      . potassium phosphate 10 mmol in dextrose 5 % 250 mL infusion  10 mmol Intravenous Once Autumn Messing III, MD      . prednisoLONE acetate (PRED FORTE) 1 % ophthalmic suspension 1 drop  1 drop Both Eyes  BID Saverio Danker, PA-C   1 drop at 06/27/15 2225  . sodium chloride 0.9 % injection 10-40 mL  10-40 mL Intracatheter PRN Earnstine Regal, PA-C   10 mL at 06/18/15 1649    Filed Vitals:   06/28/15 0500 06/28/15 0530 06/28/15 0600 06/28/15 0800  BP: 189/72 157/71 165/71   Pulse: 106 106 89   Temp:    98.2 F (36.8 C)  TempSrc:    Axillary  Resp: 18 19 23    Height:      Weight: 205 lb 7.5 oz (93.2 kg)     SpO2: 97% 94% 99%     PHYSICAL EXAM General: NAD HEENT: NG tube in Neck: No JVD, no thyromegaly.  Lungs: Scattered wheezes anteriorly CV: Nondisplaced PMI. Tachycardic, regular rhythm, normal S1/S2, no S3/S4, no murmur. 1+ pitting pretibial edema.  Abdomen: Less firm and distended.  Neurologic: Alert and oriented x 3.  Psych: Normal affect. Extremities: No clubbing or cyanosis.   TELEMETRY: Reviewed telemetry pt in sinus tachycardia. LABS: Basic Metabolic Panel:  Recent Labs  06/27/15 0545 06/27/15 0941  06/28/15 0527  NA 143  --  147*  K 3.5  --  3.0*  CL 110  --  112*  CO2 23  --  26  GLUCOSE 240*  --  75  BUN 33*  --  24*  CREATININE 1.23  --  1.01  CALCIUM 6.7*  --  6.8*  MG  --  1.9 1.9  PHOS  --  2.3* 2.3*   Liver Function Tests:  Recent Labs  06/28/15 0527  AST 27  ALT 19  ALKPHOS 538*  BILITOT 0.7  PROT 5.4*  ALBUMIN 1.9*   No results for input(s): LIPASE, AMYLASE in the last 72 hours. CBC:  Recent Labs  06/26/15 0500 06/28/15 0527  WBC 17.4* 13.2*  NEUTROABS  --  11.1*  HGB 8.3* 8.9*  HCT 25.1* 27.5*  MCV 88.1 89.6  PLT 383 360   Cardiac Enzymes: No results for input(s): CKTOTAL, CKMB, CKMBINDEX, TROPONINI in the last 72 hours. BNP: Invalid input(s): POCBNP D-Dimer: No results for input(s): DDIMER in the last 72 hours. Hemoglobin A1C: No results for input(s): HGBA1C in the last 72 hours. Fasting Lipid Panel: No results for input(s): CHOL, HDL, LDLCALC, TRIG, CHOLHDL, LDLDIRECT in the last 72 hours. Thyroid Function Tests: No results for input(s): TSH, T4TOTAL, T3FREE, THYROIDAB in the last 72 hours.  Invalid input(s): FREET3 Anemia Panel: No results for input(s): VITAMINB12, FOLATE, FERRITIN, TIBC, IRON, RETICCTPCT in the last 72 hours.  RADIOLOGY: Dg Chest 2 View  06/19/2015  CLINICAL DATA:  Fever and weakness EXAM: CHEST  2 VIEW COMPARISON:  Four days ago FINDINGS: Persistent opacity at the left base with elevated diaphragm. Right upper extremity PICC has been placed, tip at the SVC. Normal heart size for technique. Stable upper mediastinal contours. No pulmonary edema or pleural effusion. IMPRESSION: 1. Persistent left basilar atelectasis. Given fever history, note that there could be superimposed pneumonia. 2. New right upper extremity PICC is in good position. Electronically Signed   By: Monte Fantasia M.D.   On: 06/19/2015 10:50   Dg Chest 2 View  06/15/2015  CLINICAL DATA:  Wheezing on expiration EXAM: CHEST  2 VIEW COMPARISON:  Three days  ago FINDINGS: Improved lung volumes in general, but still with bandlike opacity over the left base consistent with atelectasis. The left diaphragm is elevated. Trace pleural effusions. There is also diffuse interstitial coarsening. Chronic cardiomegaly. Stable aortic and hilar  contours. Nasogastric tube tip at the proximal stomach. IMPRESSION: 1. Multi segment left basilar atelectasis. 2. Pulmonary venous congestion and trace effusions. 3. Shorter nasogastric tube with tip at the proximal stomach. Electronically Signed   By: Monte Fantasia M.D.   On: 06/15/2015 07:29   Ct Angio Chest Pe W/cm &/or Wo Cm  06/19/2015  CLINICAL DATA:  Inpatient hospitalized with central abdominal cramping and constipation. Post laparotomy and bowel resection 1 week prior. Today with fever and tachycardia. Patient with history of prostate cancer with bony metastasis. EXAM: CT ANGIOGRAPHY CHEST CT ABDOMEN AND PELVIS WITH CONTRAST TECHNIQUE: Multidetector CT imaging of the chest was performed using the standard protocol during bolus administration of intravenous contrast. Multiplanar CT image reconstructions and MIPs were obtained to evaluate the vascular anatomy. Multidetector CT imaging of the abdomen and pelvis was performed using the standard protocol during bolus administration of intravenous contrast. CONTRAST:  123mL OMNIPAQUE IOHEXOL 350 MG/ML SOLN COMPARISON:  Chest radiographs earlier this day. CT abdomen/pelvis 06/11/2015 FINDINGS: CTA CHEST FINDINGS Vascular: Suboptimal contrast bolus for evaluation of pulmonary embolus, there are no filling defects in the main, right, left, or lobar pulmonary arteries. Segmental and subsegmental branches cannot be assessed, particularly in the lower lobes where there is motion artifact. Normal caliber thoracic aorta. 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 Minimal multi chamber cardiomegaly with coronary artery calcifications.  Lungs/pleura: Small bilateral pleural effusions, unchanged on the right and increased on the left from prior abdominal CT. There is adjacent compressive atelectasis at both lung bases, left greater than right. Air bronchograms on the left, with question of superimposed pneumonia. Breathing motion artifact partially obscures detailed lung evaluation. Mediastinum/lymph nodes: Prominent AP window lymph node measures 1.9 cm short axis dimension. There is otherwise no mediastinal or hilar adenopathy. No pericardial effusion. There is a right upper extremity PICC, with tip in the distal SVC. Enteric contrast within the esophagus which is mildly tortuous. Osseous structures: Multiple sclerotic foci throughout bilateral ribs, thoracic spine, and sternum consistent with known osseous metastatic disease. CT ABDOMEN and PELVIS FINDINGS Liver: No focal hepatic lesion. Hepatobiliary: Gallbladder physiologically distended. No calcified gallstone. Pancreas: No ductal dilatation or inflammation. Spleen: Normal. Adrenal glands: No nodule. Kidneys: Symmetric renal enhancement and excretion. No hydronephrosis. Bilateral renal cysts again seen. Stomach/Bowel: Stomach physiologically distended with enteric contrast, there is wall thickening involving the fundus. Fat density lesion involving the distal gastric body measures 1.6 x 1.4 cm, unchanged, query intramural lipoma. Additional fat density lesion in the region of pylorus is also unchanged. Contrast filled prominent proximal small bowel loops without transition point. Fluid-filled and distal small bowel loops, with enteric chain sutures in the right lower quadrant. Colon is tortuous with colonic diverticulosis, no diverticulitis. Appendix remains in situ. Vascular/Lymphatic: No retroperitoneal adenopathy. Abdominal aorta is normal in caliber. Atherosclerosis of the abdominal aorta and its branches. Reproductive: Prostate gland is enlarged causing mass effect on the bladder base.  Bladder: Minimally distended with mild wall thickening, likely related to degree of distention. Other: Post laparotomy with midline skin staples. No subcutaneous fluid collection. No pneumoperitoneum or free air in the abdomen. Small amount of dependent free fluid the pelvis in mesentery. Whole body wall edema most consistent with third-spacing. Scattered foci of air in the anterior abdominal wall likely related subcutaneous injections. Musculoskeletal: Diffuse multifocal scleroses throughout the included skeleton consistent with osseous metastatic disease. Review of the MIP images confirms the above findings. IMPRESSION: 1. Suboptimal contrast bolus for  evaluation of pulmonary embolus, allowing for this, no central pulmonary embolus is seen involving the central and lobar pulmonary arteries. 2. Bilateral pleural effusions, increased on the left from prior. There is associated compressive atelectasis bilaterally, the presence of air bronchograms on the left may reflect superimposed pneumonia. 3. Prominent contrast and fluid-filled small bowel without transition point, suspect postoperative ileus. Recurrent small bowel obstruction is felt less likely. 4. Additional chronic findings as described. Electronically Signed   By: Jeb Levering M.D.   On: 06/19/2015 19:58   Ct Abdomen Pelvis W Contrast  06/19/2015  CLINICAL DATA:  Inpatient hospitalized with central abdominal cramping and constipation. Post laparotomy and bowel resection 1 week prior. Today with fever and tachycardia. Patient with history of prostate cancer with bony metastasis. EXAM: CT ANGIOGRAPHY CHEST CT ABDOMEN AND PELVIS WITH CONTRAST TECHNIQUE: Multidetector CT imaging of the chest was performed using the standard protocol during bolus administration of intravenous contrast. Multiplanar CT image reconstructions and MIPs were obtained to evaluate the vascular anatomy. Multidetector CT imaging of the abdomen and pelvis was performed using the  standard protocol during bolus administration of intravenous contrast. CONTRAST:  183mL OMNIPAQUE IOHEXOL 350 MG/ML SOLN COMPARISON:  Chest radiographs earlier this day. CT abdomen/pelvis 06/11/2015 FINDINGS: CTA CHEST FINDINGS Vascular: Suboptimal contrast bolus for evaluation of pulmonary embolus, there are no filling defects in the main, right, left, or lobar pulmonary arteries. Segmental and subsegmental branches cannot be assessed, particularly in the lower lobes where there is motion artifact. Normal caliber thoracic aorta. 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 Minimal multi chamber cardiomegaly with coronary artery calcifications. Lungs/pleura: Small bilateral pleural effusions, unchanged on the right and increased on the left from prior abdominal CT. There is adjacent compressive atelectasis at both lung bases, left greater than right. Air bronchograms on the left, with question of superimposed pneumonia. Breathing motion artifact partially obscures detailed lung evaluation. Mediastinum/lymph nodes: Prominent AP window lymph node measures 1.9 cm short axis dimension. There is otherwise no mediastinal or hilar adenopathy. No pericardial effusion. There is a right upper extremity PICC, with tip in the distal SVC. Enteric contrast within the esophagus which is mildly tortuous. Osseous structures: Multiple sclerotic foci throughout bilateral ribs, thoracic spine, and sternum consistent with known osseous metastatic disease. CT ABDOMEN and PELVIS FINDINGS Liver: No focal hepatic lesion. Hepatobiliary: Gallbladder physiologically distended. No calcified gallstone. Pancreas: No ductal dilatation or inflammation. Spleen: Normal. Adrenal glands: No nodule. Kidneys: Symmetric renal enhancement and excretion. No hydronephrosis. Bilateral renal cysts again seen. Stomach/Bowel: Stomach physiologically distended with enteric contrast, there is wall thickening  involving the fundus. Fat density lesion involving the distal gastric body measures 1.6 x 1.4 cm, unchanged, query intramural lipoma. Additional fat density lesion in the region of pylorus is also unchanged. Contrast filled prominent proximal small bowel loops without transition point. Fluid-filled and distal small bowel loops, with enteric chain sutures in the right lower quadrant. Colon is tortuous with colonic diverticulosis, no diverticulitis. Appendix remains in situ. Vascular/Lymphatic: No retroperitoneal adenopathy. Abdominal aorta is normal in caliber. Atherosclerosis of the abdominal aorta and its branches. Reproductive: Prostate gland is enlarged causing mass effect on the bladder base. Bladder: Minimally distended with mild wall thickening, likely related to degree of distention. Other: Post laparotomy with midline skin staples. No subcutaneous fluid collection. No pneumoperitoneum or free air in the abdomen. Small amount of dependent free fluid the pelvis in mesentery. Whole body wall edema most consistent with third-spacing. Scattered  foci of air in the anterior abdominal wall likely related subcutaneous injections. Musculoskeletal: Diffuse multifocal scleroses throughout the included skeleton consistent with osseous metastatic disease. Review of the MIP images confirms the above findings. IMPRESSION: 1. Suboptimal contrast bolus for evaluation of pulmonary embolus, allowing for this, no central pulmonary embolus is seen involving the central and lobar pulmonary arteries. 2. Bilateral pleural effusions, increased on the left from prior. There is associated compressive atelectasis bilaterally, the presence of air bronchograms on the left may reflect superimposed pneumonia. 3. Prominent contrast and fluid-filled small bowel without transition point, suspect postoperative ileus. Recurrent small bowel obstruction is felt less likely. 4. Additional chronic findings as described. Electronically Signed   By:  Jeb Levering M.D.   On: 06/19/2015 19:58   Ct Abdomen Pelvis W Contrast  06/11/2015  ADDENDUM REPORT: 06/11/2015 12:31 ADDENDUM: The salient findings were discussed with Zenovia Jarred on 06/11/2015 at 12:31 pm. Electronically Signed   By: Nolon Nations M.D.   On: 06/11/2015 12:31  06/11/2015  CLINICAL DATA:  abdominal pain awoke him this am. Denies n/v/d or fevers. No BM since this past Sunday. History of prostate cancer. ^118mL OMNIPAQUE IOHEXOL 300 MG/ML SOLN EXAM: CT ABDOMEN AND PELVIS WITH CONTRAST TECHNIQUE: Multidetector CT imaging of the abdomen and pelvis was performed using the standard protocol following bolus administration of intravenous contrast. CONTRAST:  119mL OMNIPAQUE IOHEXOL 300 MG/ML  SOLN COMPARISON:  Lower chest: There bilateral pleural effusions. Bibasilar atelectasis. Coronary artery calcifications are present. Heart size appears normal. Upper abdomen: No focal abnormality identified within the liver, spleen, pancreas, or adrenal glands. Gallbladder is present. Bilateral renal cysts are present. No intrarenal or ureteral stones. Gastrointestinal tract: There are numerous dilated proximal small bowel loops contain contrast. In the left central lower abdomen there is a non-opacified loop of small bowel with surrounding mesenteric fluid. Asleep is felt to be the point of obstruction likely represents a closed loop obstruction. The small bowel loops distal to this level are relatively collapsed. There are numerous colonic diverticula but no evidence for acute diverticulitis. The colon is redundant. The appendix is well seen and has a normal appearance. Pelvis: The prostate is enlarged. Urinary bladder otherwise has a normal appearance. No free pelvic fluid. No pelvic adenopathy. Retroperitoneum: Atherosclerotic calcification identified within the aorta. Abdominal wall: Fat containing umbilical hernia. Osseous structures: Interval progression of significant osseous metastatic  disease.2 diffuse sclerotic metastases involve the pelvis, proximal femurs, and all of the visualized vertebrae. FINDINGS: 1. Findings consistent with closed loop obstruction of the mid to distal small bowel. Given the presence of surrounding mesenteric fluid, perforation should be considered. There is no free intraperitoneal air however. 2. Coronary artery disease. 3. Normal appendix. 4. Colonic diverticulosis. 5. Prostatic enlargement. 6. Umbilical hernia. 7. Progression of diffuse sclerotic metastases. Electronically Signed: By: Nolon Nations M.D. On: 06/11/2015 12:21   Dg Chest Port 1 View  06/27/2015  CLINICAL DATA:  Shortness of breath, abdominal distension, nasogastric tube placement, hypertension, type II diabetes mellitus, prostate cancer, former smoker EXAM: PORTABLE CHEST 1 VIEW COMPARISON:  Portable exam 1023 hours compared to 06/25/2015 FINDINGS: Nasogastric tube extends into stomach. RIGHT arm PICC line tip projects over SVC. LEFT jugular central venous catheter tip projects over confluence of LEFT brachiocephalic vein and SVC. Rotated to the RIGHT. Enlargement of cardiac silhouette. Atherosclerotic calcification aorta. Bibasilar atelectasis. Improved perihilar infiltrates. No pleural effusion or pneumothorax. Gaseous distention of bowel in LEFT upper quadrant. IMPRESSION: Bibasilar atelectasis. Enlargement of cardiac silhouette. Improved pulmonary  infiltrates. Electronically Signed   By: Lavonia Dana M.D.   On: 06/27/2015 11:16   Dg Chest Port 1 View  06/25/2015  CLINICAL DATA:  Respiratory failure. EXAM: PORTABLE CHEST 1 VIEW COMPARISON:  06/24/2015. FINDINGS: Interim extubation and removal of NG tube . Right PICC line, left IJ line stable position. Cardiomegaly. Persistent interstitial prominence, interstitial edema cannot be excluded. Persistent low lung volumes with right mid lung field and left base atelectasis and/or infiltrates. No pleural effusion or pneumothorax. IMPRESSION: 1.  Interim extubation removal of NG tube. Left IJ line and right PICC line stable position. 2. Persistent cardiomegaly pulmonary interstitial prominence suggesting interstitial edema. No interim change. 3. Persistent low lung volumes with right mid lung and left base subsegmental atelectasis. Electronically Signed   By: [place]   On: 06/25/2015 07:18   Dg Chest Port 1 View  06/24/2015  CLINICAL DATA:  Respiratory failure. EXAM: PORTABLE CHEST 1 VIEW COMPARISON:  06/23/2015. FINDINGS: Endotracheal tube, NG tube, left IJ line, right PICC line in stable position. Mediastinum hilar structures are stable. Cardiomegaly with diffuse bilateral pulmonary interstitial prominence consistent with congestive heart failure. Pneumonitis cannot be excluded. Persistent right mid lung and left base subsegmental atelectasis. No prominent pleural effusion. No pneumothorax. IMPRESSION: 1. Lines and tubes in stable position. 2. Persistent cardiomegaly. Interim appearance of bilateral from interstitial prominence. Findings suggest congestive heart failure. 3. Persistent low lung volumes with persistent right mid lung and left base subsegmental atelectasis. Electronically Signed   By: [name]  Register   On: 06/24/2015 07:18   Dg Chest Port 1 View  06/23/2015  CLINICAL DATA:  Central line placement EXAM: PORTABLE CHEST 1 VIEW COMPARISON:  06/23/2015 FINDINGS: Left internal jugular central line has been placed with the tip in the upper SVC. No pneumothorax. Endotracheal tube is 3 cm above the carina. NG tube is in the stomach. Right PICC line is unchanged. Patchy right upper lobe and left lower lobe airspace opacities are again noted, unchanged. Low lung volumes. IMPRESSION: Left central line placement with the tip in the upper SVC. No pneumothorax. Otherwise no significant change. Electronically Signed   By: Rolm Baptise M.D.   On: 06/23/2015 17:15   Dg Chest Port 1 View  06/23/2015  CLINICAL DATA:  ET tube repositioning  EXAM: PORTABLE CHEST 1 VIEW COMPARISON:  06/23/2015 FINDINGS: Endotracheal tube has been retracted and is approximately 2 cm above the carina. Right PICC line is unchanged as is the NG tube. There is mild cardiomegaly. Left lower lobe atelectasis or infiltrate noted. Patchy right upper lobe opacity also noted. These are unchanged since prior study. IMPRESSION: Interval retraction of the endotracheal tube, now approximately 2 cm above the carina. Stable patchy right upper lobe and left lower lobe airspace opacities. Electronically Signed   By: Rolm Baptise M.D.   On: 06/23/2015 16:29   Dg Chest Port 1 View  06/23/2015  CLINICAL DATA:  Status post endotracheal intubation EXAM: PORTABLE CHEST - 1 VIEW COMPARISON:  06/23/2015 FINDINGS: Cardiac shadow is stable. Some mild atelectatic changes remain in the left lung base and right mid lung. Nasogastric catheter is been placed but is coiled at the level of the diaphragm in may lie within a hiatal hernia. A right-sided PICC line is noted at the cavoatrial junction. An endotracheal tube is seen at the level of the carina directing slightly into the right mainstem bronchus. This should be withdrawn approximately 4 cm. No other focal abnormality is noted. IMPRESSION: Tubes and lines as described  above. The endotracheal tube should be withdrawn 4 cm. Bilateral patchy atelectatic changes. These results were called by telephone at the time of interpretation on 06/23/2015 at 2:14 pm to [facility], the patients nurse Who verbally acknowledged these results. Electronically Signed   By: Inez Catalina M.D.   On: 06/23/2015 14:19   Dg Chest Port 1 View  06/23/2015  CLINICAL DATA:  Acute onset of shortness of breath and decreased O2 saturation. Initial encounter. EXAM: PORTABLE CHEST 1 VIEW COMPARISON:  Chest radiograph and CT of the chest performed 06/19/2015 FINDINGS: The lungs are mildly hypoexpanded. Left basilar airspace opacity raises concern for pneumonia. There is no evidence  of pleural effusion or pneumothorax. The cardiomediastinal silhouette is borderline normal in size. No acute osseous abnormalities are seen. IMPRESSION: Lungs mildly hypoexpanded. Left basilar airspace opacity is concerning for pneumonia. Electronically Signed   By: Garald Balding M.D.   On: 06/23/2015 05:17   Portable Chest Xray - Atelectasis  06/12/2015  CLINICAL DATA:  Aspiration of vomitus, initial encounter. EXAM: PORTABLE CHEST 1 VIEW COMPARISON:  Nov 09, 2014. FINDINGS: Stable cardiomediastinal silhouette. Hypoinflation of the lungs is noted. Nasogastric tube tip is seen in proximal stomach. No pneumothorax is noted. Mild central pulmonary vascular congestion is noted. Small amount of fluid is noted in the right minor fissure. Left basilar opacity is noted concerning for pneumonia or atelectasis with associated pleural effusion. Bony thorax is unremarkable. IMPRESSION: Hypoinflation of the lungs is noted. Mild central pulmonary vascular congestion is noted. Left basilar opacity is noted concerning for pneumonia or atelectasis with associated pleural effusion. Electronically Signed   By: Marijo Conception, M.D.   On: 06/12/2015 16:43   Dg Abd Portable 1v  06/27/2015  CLINICAL DATA:  Abdominal distention today.  NG tube placement. EXAM: PORTABLE ABDOMEN - 1 VIEW COMPARISON:  06/19/2015 and prior CTs. 06/12/2015 and prior radiographs. FINDINGS: An NG tube is identified with tip overlying the mid stomach. Mild gaseous distention of colon and small bowel again noted. Surgical staples overlying the abdomen and pelvis are again noted. No other significant changes noted. IMPRESSION: NG tube with tip overlying the mid stomach. Continued mild gaseous distension of small bowel and colon-question ileus. Electronically Signed   By: Margarette Canada M.D.   On: 06/27/2015 10:45   Dg Abd Portable 1v  06/12/2015  CLINICAL DATA:  Nasogastric tube placement EXAM: PORTABLE ABDOMEN - 1 VIEW COMPARISON:  Yesterday FINDINGS:  Advancement of nasogastric tube with tip in the peripatellar region. Unchanged dilation of small bowel. There is a new lucency over the epigastrium and right abdomen which may outline the falciform ligament and the outer wall of a small bowel loop. Critical Value/emergent results were called by telephone at the time of interpretation on 06/12/2015 at 8:07 am to Dr. Saverio Danker , who verbally acknowledged these results. IMPRESSION: 1. Possible new pneumoperitoneum. Depending on exam, confirmation could be obtained by CT or upright/decubitus imaging. 2. Advanced nasogastric tube with tip at the pylorus. 3. Unchanged small bowel distention. Electronically Signed   By: Monte Fantasia M.D.   On: 06/12/2015 08:08   Dg Abd Portable 1v-small Bowel Obstruction Protocol-initial, 8 Hr Delay  06/11/2015  CLINICAL DATA:  NG tube placement EXAM: PORTABLE ABDOMEN - 1 VIEW COMPARISON:  CT of the abdomen and pelvis from the same day. FINDINGS: Side port of an NG tube is just beyond the GE junction. Lung bases are clear. Multiple dilated loops of small bowel are again seen. IMPRESSION: 1. The side  port of the NG tube is just beyond the GE junction and could be advanced several cm for more optimal positioning. 2. Persistent dilation of multiple loops of small bowel. Electronically Signed   By: San Morelle M.D.   On: 06/11/2015 17:17      ASSESSMENT AND PLAN: 1. Acute on chronic diastolic heart failure: Diuresed approx 5.4 L in past 24 hrs on Lasix IV 20 mg bid but legs still edematous. BUN/SCr stable (BUN 38-->33--->24, SCr 1.01). Continue. Will aim to control HTN.  2. Sinus tachycardia: Continue IV metoprolol 10 mg q 8 hrs until fully able to tolerate pills.  3. Essential HTN: On prn hydralazine. Will schedule 10 mg IV q 8 hrs.  Kate Sable, M.D., F.A.C.C.

## 2015-06-28 NOTE — Progress Notes (Signed)
[facility] Progress Note Patient Name: DRAYSEN LOWERY DOB: July 26, 1935 MRN: [id number]   Date of Service  06/28/2015  HPI/Events of Note  Called by nurse for fever and hypertension.  Nurse also notes mild confusion.  eICU Interventions  Blood cultures Urinalysis Stop lopressor and start labetalol Minimize sedating medications Supportive care     Intervention Category Major Interventions: Infection - evaluation and management  Mauri Brooklyn, P 06/28/2015, 9:41 PM

## 2015-06-28 NOTE — Progress Notes (Addendum)
PARENTERAL NUTRITION CONSULT NOTE - FOLLOW-UP  Pharmacy Consult for TPN Indication: Prolonged Ileus  No Known Allergies  Patient Measurements: Height: 6' (182.9 cm) Weight: 205 lb 7.5 oz (93.2 kg) IBW/kg (Calculated) : 77.6 Adjusted Body Weight: 82.5 kg  Vital Signs: Temp: 98.2 F (36.8 C) (12/18 0800) Temp Source: Axillary (12/18 0800) BP: 150/85 mmHg (12/18 0934) Pulse Rate: 89 (12/18 0600) Intake/Output from previous day: 12/17 0701 - 12/18 0700 In: 684.1 [IV Piggyback:150; TPN:534.1] Out: 6050 [Urine:5050; Emesis/NG output:1000] Intake/Output from this shift: Total I/O In: -  Out: 500 [Urine:500]  Labs:  Recent Labs  06/26/15 0500 06/28/15 0527  WBC 17.4* 13.2*  HGB 8.3* 8.9*  HCT 25.1* 27.5*  PLT 383 360     Recent Labs  06/26/15 0500 06/27/15 0545 06/27/15 0941 06/28/15 0527  NA 140 143  --  147*  K 4.0 3.5  --  3.0*  CL 107 110  --  112*  CO2 21* 23  --  26  GLUCOSE 175* 240*  --  75  BUN 38* 33*  --  24*  CREATININE 1.25* 1.23  --  1.01  CALCIUM 7.0* 6.7*  --  6.8*  MG  --   --  1.9 1.9  PHOS  --   --  2.3* 2.3*  PROT  --   --   --  5.4*  ALBUMIN  --   --   --  1.9*  AST  --   --   --  27  ALT  --   --   --  19  ALKPHOS  --   --   --  538*  BILITOT  --   --   --  0.7  PREALBUMIN  --   --   --  17.9*  TRIG  --   --   --  150*   Estimated Creatinine Clearance: 72.6 mL/min (by C-G formula based on Cr of 1.01).    Recent Labs  06/28/15 0036 06/28/15 0334 06/28/15 0743  GLUCAP 94 82 82    Medical History: Past Medical History  Diagnosis Date  . Essential hypertension   . Type 2 diabetes mellitus ([place])   . Hyperlipidemia   . Prostate cancer ([place])   . History of GI bleed   . Tachycardia   . PAC (premature atrial contraction)     Frequent   Insulin Requirements:  11 units SSI (none after resumption of TPN 12/17 PM) Lantus 16 units  Regular Insulin 15 units in TPN  Current Nutrition: NPO (unable to take Juven powder  nutritional supplements currently)  IVF: D5W at 35 ml/hr  Central access: PICC placed 12/6 TPN start date: 12/6-12/14, resumed 12/17  ASSESSMENT                                                                                                          HPI: 11 y/oM with widely metastatic prostate cancer who presented 12/1 with abdominal pain nausea and vomiting. CT scan concerning for SBO. Patient underwent emergency laparotomy with small bowel resection on  12/2. Patient developed post-op ileus. CCS  consulted pharmacy to begin TPN for prolonged ileus on 12/6, and patient remained on TPN until 12/14. Asked to resume TPN 12/17 for nutrition support as patient with increased n/v, unable to take POs.  Significant events:  12/10: transferred to ICU 12/11: significant increase in SCr, CBG's remain elevated 12/13: start soft diet, orders to wean TPN to off **afternoon update**PEA arrest, pt intubated, consulted to continue TPN 12/14: self extubated, TPN weaned off 12/17: more nausea and emesis, not able to take POs. NGT placed for persistent ileus. Resume TPN  Today:    Glucose -  CBGs 226 >> 82, (goal < 150), last dose of steroids 12/16  Electrolytes - K+ low at 3, Phos low at 2.3, Corrected Calcium low at 8.48, Na+ and Cl- slightly high  Renal - SCr improved to 1.01 with CrCl ~ 73 ml/min   LFTs - AST improved to WNL, ALT WNL, Tbili WNL, Alk Phos elevated at 538, but trending down  TGs - 157 (12/7), 149 (12/12), 178 (12/14), 150 (12/18)  Prealbumin - 3.5 (12/6), 4.4 (12/7), 2.4 (12/12), 4.8 (12/14), 17.9 (12/18)  NUTRITIONAL GOALS                                                                                             RD recs (revised 06/25/15): Kcal: 2000-2200, Protein: 100-110g  Clinimix 5/15 at a goal rate of 90 ml/hr + 20% fat emulsion at 10 ml/hr to provide: 108g/day protein, 2013Kcal/day.  PLAN         KPhos 10 mmol IV x 1  KCl 10 mEq x 4 runs  Calcium gluconate 1g IV  x 1                                                                                                                   At 1800 today:  Increase Clinimix E 5/15 to 70 ml/hr. Could use Clinimix 5/20 to obtain higher Kcal while withholding lipids, but due to recent uncontrolled CBGs and elevated liver enzymes (Alk Phos), will opt to use 5/15 at this time.    Withhold IV fat emulsion for 1st seven days of ICU stay per SCCM/ASPEN guidelines (today is day 6).  Continue 15 units regular insulin/24 hours in TPN.  Add Famotidine 20 mg to TPN bag. D/C IVPB order.  Plan to advance as tolerated to the goal rate.  TPN to contain standard multivitamins and trace elements.  Continue IVF per MD order.  Monitor electrolytes closely-if hypernatremia persists despite adding D5W IVF, will plan to remove electrolytes in TPN tomorrow.   Continue resistant SSI q4h. Lantus reduced  to 10 units qHS per MD.   TPN lab panels on Mondays & Thursdays.  F/u daily.   Lindell Spar, PharmD, BCPS Pager: [phone] 06/28/2015 11:37 AM

## 2015-06-28 NOTE — Progress Notes (Signed)
PULMONARY / CRITICAL CARE MEDICINE   Name: Jeremy Jennings MRN: [id number] DOB: 08/06/1935    ADMISSION DATE:  06/11/2015 CONSULTATION DATE:  06/23/15  REFERRING MD :  CCS  CHIEF COMPLAINT:  Acute respiratory failure, cardiac arrest  INITIAL PRESENTATION: Admitted on 06/11/15 with abdominal pain. CT scan concerning for small bowel obstruction. Underwent ex-lap and small bowel resection on 06/12/15. Postoperative course notable for sinus tachycardia. Increasing respiratory distress on 12/13. Intubated and had a brief code.  STUDIES:  LE Dopplers 12/9 >> negative for DVT CT scan chest 12/9>> negative for PE, bibasilar pleural effusions and atelectasis Echo 12/9 >>LVEF 02%, mid-systolic LV cavitary obliteration, moderate LVH, upper normal LA size, mild TR, RVSP 34 mmHg + RAP (IVC not visualized). Chest x-ray 12/13 >> left basilar opacity, concerning for pneumonia.  SIGNIFICANT EVENTS: 12/2 >> exploratory laparoscopy, small bowel resection 12/13 >> PEA arrest, intubated 12/14 >> self extubated  SUBJECTIVE:  No pressors, no vent, HTN noted Neg 5.3 liters  VITAL SIGNS: RA   Temp:  [97.4 F (36.3 C)-99.3 F (37.4 C)] 98.2 F (36.8 C) (12/18 0800) Pulse Rate:  [83-109] 89 (12/18 0600) Resp:  [13-23] 23 (12/18 0600) BP: (150-195)/(68-89) 165/71 mmHg (12/18 0600) SpO2:  [88 %-100 %] 99 % (12/18 0600) Weight:  [93.2 kg (205 lb 7.5 oz)] 93.2 kg (205 lb 7.5 oz) (12/18 0500) HEMODYNAMICS:      INTAKE / OUTPUT:  Intake/Output Summary (Last 24 hours) at 06/28/15 0918 Last data filed at 06/28/15 0630  Gross per 24 hour  Intake  684.1 ml  Output   5450 ml  Net -4765.9 ml    PHYSICAL EXAMINATION: General:  Awake, oriented Neuro:  PERRLA, no gross focal deficits HEENT:  jvd wnl Cardiovascular:  s1 s2 rrt no m Lungs:  CTA Abdomen:  Distended, hypo BS, passing gas.  nt this am  Skin:  Intact  LABS:  CBC  Recent Labs Lab 06/24/15 0400 06/26/15 0500 06/28/15 0527  WBC  12.5* 17.4* 13.2*  HGB 7.5* 8.3* 8.9*  HCT 23.0* 25.1* 27.5*  PLT 224 383 360   Coag's  Recent Labs Lab 06/22/15 0515 06/23/15 1311  INR 1.24 1.30   BMET  Recent Labs Lab 06/26/15 0500 06/27/15 0545 06/28/15 0527  NA 140 143 147*  K 4.0 3.5 3.0*  CL 107 110 112*  CO2 21* 23 26  BUN 38* 33* 24*  CREATININE 1.25* 1.23 1.01  GLUCOSE 175* 240* 75   Electrolytes  Recent Labs Lab 06/24/15 0400  06/26/15 0500 06/27/15 0545 06/27/15 0941 06/28/15 0527  CALCIUM 6.7*  < > 7.0* 6.7*  --  6.8*  MG 2.3  --   --   --  1.9 1.9  PHOS 2.7  --   --   --  2.3* 2.3*  < > = values in this interval not displayed. Sepsis Markers  Recent Labs Lab 06/23/15 1311 06/24/15 0400 06/25/15 0530 06/26/15 0500  LATICACIDVEN 1.6  --   --   --   PROCALCITON  --  1.75 1.31 0.83   ABG  Recent Labs Lab 06/23/15 1033 06/23/15 1600  PHART 7.379 7.347*  PCO2ART 33.0* 33.6*  PO2ART 91.6 321*   Liver Enzymes  Recent Labs Lab 06/22/15 0515 06/24/15 0400 06/28/15 0527  AST 46* 61* 27  ALT 21 20 19   ALKPHOS 327* 655* 538*  BILITOT 0.5 0.5 0.7  ALBUMIN 1.5* 1.6* 1.9*   Cardiac Enzymes  Recent Labs Lab 06/23/15 1311  TROPONINI <0.03  Glucose  Recent Labs Lab 06/27/15 1158 06/27/15 1657 06/27/15 2125 06/28/15 0036 06/28/15 0334 06/28/15 0743  GLUCAP 176* 115* 120* 94 82 82    Imaging Dg Chest Port 1 View  06/27/2015  CLINICAL DATA:  Shortness of breath, abdominal distension, nasogastric tube placement, hypertension, type II diabetes mellitus, prostate cancer, former smoker EXAM: PORTABLE CHEST 1 VIEW COMPARISON:  Portable exam 1023 hours compared to 06/25/2015 FINDINGS: Nasogastric tube extends into stomach. RIGHT arm PICC line tip projects over SVC. LEFT jugular central venous catheter tip projects over confluence of LEFT brachiocephalic vein and SVC. Rotated to the RIGHT. Enlargement of cardiac silhouette. Atherosclerotic calcification aorta. Bibasilar atelectasis.  Improved perihilar infiltrates. No pleural effusion or pneumothorax. Gaseous distention of bowel in LEFT upper quadrant. IMPRESSION: Bibasilar atelectasis. Enlargement of cardiac silhouette. Improved pulmonary infiltrates. Electronically Signed   By: Lavonia Dana M.D.   On: 06/27/2015 11:16   Dg Abd Portable 1v  06/27/2015  CLINICAL DATA:  Abdominal distention today.  NG tube placement. EXAM: PORTABLE ABDOMEN - 1 VIEW COMPARISON:  06/19/2015 and prior CTs. 06/12/2015 and prior radiographs. FINDINGS: An NG tube is identified with tip overlying the mid stomach. Mild gaseous distention of colon and small bowel again noted. Surgical staples overlying the abdomen and pelvis are again noted. No other significant changes noted. IMPRESSION: NG tube with tip overlying the mid stomach. Continued mild gaseous distension of small bowel and colon-question ileus. Electronically Signed   By: Margarette Canada M.D.   On: 06/27/2015 10:45   ASSESSMENT / PLAN:  79 year old metastatic prostate cancer. Status post exploratory laparoscopy with bowel resection for small bowel obstruction. Intubated for acute respiratory failure with wheezing and increased work of breathing. Brief PEA arrest  Being Rx for copd exac. On zosyn.  Pt with persistent emesis,   PULMONARY OETT 12/13 >> 12/14 A: Atelectasis HCAP ? NOS   PCT normal. No fever , WBC higher No hx of copd.   No PE pulm edema component P:   Wean down FiO2 as tolerated IS On RA now Neg balance goals remain, but lower Ambulate No feeding, asp risk ileus still remains decompressing stomach  CARDIOVASCULAR CVL A:  Sinus tachycardia Diastolic heart failure P:  Continue Lasix, but reduce, on RA Cont IV metoprolol No cardizem - ileus  RENAL A:   Hypernatremia (tpn) Hypokalemia, hypophos P:   Monitor BUN/creatinine, urine output Add d5w k supp Phos, mag in am  Chem in am   GASTROINTESTINAL A:   Small bowel obstruction with hernia Status post  exploratory laparoscopy, small bowel resection Postop ileus Protein calorie malnutrition. Severe Reducing alk phos P:   Replace NGT TPN  Recheck LFTs in 48 hr  pepcid for SUP Ice chips  HEMATOLOGIC A: Stable, anemia crit illness, resolving leukocytosis P:  Monitor DVT proph with hep sq Lasix will continued to have hct rise  INFECTIOUS A:  HCAP Enterococcus UTI. P:   BCx2 >> UC 12/9 >> Enterococcus Sputum  12/13 >> NGTD Abx: [place], start date 12/13 >> 12/16 Zosyn, start date 12/13 >>   . Continue Zosyn for 7 days. End date 12/19, allow to dc   ENDOCRINE A:  Diabetes mellitus, glu controlled, low at times P:   Continue Lantus but reduce Changes SSI to npo  NEUROLOGIC A:   Pain control adequate  P:   Prn fentanyl PT  FAMILY  - Updates: Daughter updated at bedside 12/17, she wanted him tfr to [facility] , mainly for convenience but was interested in  second opinion.  She is willing to wait to see how he does over the weekend.12/18, i met with daughter all concerned ansered  - Inter-disciplinary family meet or Palliative Care meeting due by: 12/20.   Lavon Paganini. Titus Mould, MD, [place] Pgr: [facility]

## 2015-06-29 ENCOUNTER — Inpatient Hospital Stay (HOSPITAL_COMMUNITY): Payer: Medicare Other

## 2015-06-29 DIAGNOSIS — J9690 Respiratory failure, unspecified, unspecified whether with hypoxia or hypercapnia: Secondary | ICD-10-CM

## 2015-06-29 DIAGNOSIS — I509 Heart failure, unspecified: Secondary | ICD-10-CM

## 2015-06-29 DIAGNOSIS — K460 Unspecified abdominal hernia with obstruction, without gangrene: Secondary | ICD-10-CM

## 2015-06-29 DIAGNOSIS — C61 Malignant neoplasm of prostate: Secondary | ICD-10-CM

## 2015-06-29 DIAGNOSIS — C7951 Secondary malignant neoplasm of bone: Secondary | ICD-10-CM

## 2015-06-29 LAB — GLUCOSE, CAPILLARY
GLUCOSE-CAPILLARY: 176 mg/dL — AB (ref 65–99)
Glucose-Capillary: 109 mg/dL — ABNORMAL HIGH (ref 65–99)
Glucose-Capillary: 165 mg/dL — ABNORMAL HIGH (ref 65–99)
Glucose-Capillary: 210 mg/dL — ABNORMAL HIGH (ref 65–99)
Glucose-Capillary: 186 mg/dL — ABNORMAL HIGH (ref 65–99)
Glucose-Capillary: 159 mg/dL — ABNORMAL HIGH (ref 65–99)

## 2015-06-29 LAB — CBC
HEMATOCRIT: 22.8 % — AB (ref 39.0–52.0)
HEMOGLOBIN: 7.6 g/dL — AB (ref 13.0–17.0)
MCH: 29.6 pg (ref 26.0–34.0)
MCHC: 33.3 g/dL (ref 30.0–36.0)
MCV: 88.7 fL (ref 78.0–100.0)
Platelets: 302 10*3/uL (ref 150–400)
RBC: 2.57 MIL/uL — AB (ref 4.22–5.81)
RDW: 16.1 % — ABNORMAL HIGH (ref 11.5–15.5)
WBC: 12.9 10*3/uL — ABNORMAL HIGH (ref 4.0–10.5)

## 2015-06-29 LAB — DIFFERENTIAL
BASOS ABS: 0 10*3/uL (ref 0.0–0.1)
Basophils Relative: 0 %
EOS ABS: 0.3 10*3/uL (ref 0.0–0.7)
Eosinophils Relative: 2 %
LYMPHS PCT: 8 %
Lymphs Abs: 1 10*3/uL (ref 0.7–4.0)
MONOS PCT: 4 %
Monocytes Absolute: 0.5 10*3/uL (ref 0.1–1.0)
NEUTROS ABS: 11.1 10*3/uL — AB (ref 1.7–7.7)
NEUTROS PCT: 86 %

## 2015-06-29 LAB — BASIC METABOLIC PANEL
ANION GAP: 9 (ref 5–15)
BUN: 21 mg/dL — ABNORMAL HIGH (ref 6–20)
CALCIUM: 6.5 mg/dL — AB (ref 8.9–10.3)
CHLORIDE: 106 mmol/L (ref 101–111)
CO2: 26 mmol/L (ref 22–32)
CREATININE: 0.93 mg/dL (ref 0.61–1.24)
GFR calc non Af Amer: >60 mL/min (ref >60)
Glucose, Bld: 161 mg/dL — ABNORMAL HIGH (ref 65–99)
Potassium: 3.1 mmol/L — ABNORMAL LOW (ref 3.5–5.1)
SODIUM: 141 mmol/L (ref 135–145)

## 2015-06-29 LAB — COMPREHENSIVE METABOLIC PANEL
ALT: 16 U/L — AB (ref 17–63)
AST: 26 U/L (ref 15–41)
Albumin: 1.8 g/dL — ABNORMAL LOW (ref 3.5–5.0)
Alkaline Phosphatase: 417 U/L — ABNORMAL HIGH (ref 38–126)
Anion gap: 9 (ref 5–15)
BUN: 20 mg/dL (ref 6–20)
CHLORIDE: 108 mmol/L (ref 101–111)
CO2: 27 mmol/L (ref 22–32)
CREATININE: 0.93 mg/dL (ref 0.61–1.24)
Calcium: 6.4 mg/dL — CL (ref 8.9–10.3)
GFR calc non Af Amer: >60 mL/min (ref >60)
Glucose, Bld: 187 mg/dL — ABNORMAL HIGH (ref 65–99)
POTASSIUM: 2.9 mmol/L — AB (ref 3.5–5.1)
SODIUM: 144 mmol/L (ref 135–145)
Total Bilirubin: 0.6 mg/dL (ref 0.3–1.2)
Total Protein: 5.2 g/dL — ABNORMAL LOW (ref 6.5–8.1)

## 2015-06-29 LAB — PHOSPHORUS: PHOSPHORUS: 2.5 mg/dL (ref 2.5–4.6)

## 2015-06-29 LAB — URINALYSIS, ROUTINE W REFLEX MICROSCOPIC
BILIRUBIN URINE: NEGATIVE
Glucose, UA: NEGATIVE mg/dL
KETONES UR: NEGATIVE mg/dL
Leukocytes, UA: NEGATIVE
NITRITE: NEGATIVE
PROTEIN: 30 mg/dL — AB
Specific Gravity, Urine: 1.012 (ref 1.005–1.030)
pH: 6 (ref 5.0–8.0)

## 2015-06-29 LAB — MAGNESIUM: MAGNESIUM: 1.7 mg/dL (ref 1.7–2.4)

## 2015-06-29 LAB — URINE MICROSCOPIC-ADD ON: SQUAMOUS EPITHELIAL / LPF: NONE SEEN

## 2015-06-29 LAB — TRIGLYCERIDES: TRIGLYCERIDES: 161 mg/dL — AB (ref <150)

## 2015-06-29 LAB — PREALBUMIN: PREALBUMIN: 12.4 mg/dL — AB (ref 18–38)

## 2015-06-29 MED ORDER — CHLORHEXIDINE GLUCONATE 0.12 % MT SOLN
15.0000 mL | Freq: Two times a day (BID) | OROMUCOSAL | Status: DC
  Administered 2015-06-29 – 2015-07-02 (×8): 15 mL via OROMUCOSAL
  Filled 2015-06-29 (×5): qty 15

## 2015-06-29 MED ORDER — MAGNESIUM SULFATE 2 GM/50ML IV SOLN
2.0000 g | Freq: Once | INTRAVENOUS | Status: AC
  Administered 2015-06-29: 2 g via INTRAVENOUS
  Filled 2015-06-29: qty 50

## 2015-06-29 MED ORDER — SODIUM CHLORIDE 0.9 % IV SOLN
2.0000 g | Freq: Once | INTRAVENOUS | Status: AC
  Administered 2015-06-29: 2 g via INTRAVENOUS
  Filled 2015-06-29: qty 20

## 2015-06-29 MED ORDER — FAT EMULSION 20 % IV EMUL
240.0000 mL | INTRAVENOUS | Status: AC
  Administered 2015-06-29: 240 mL via INTRAVENOUS
  Filled 2015-06-29: qty 250

## 2015-06-29 MED ORDER — FAT EMULSION 20 % IV EMUL: 240.0000 mL | INTRAVENOUS | Status: DC

## 2015-06-29 MED ORDER — POTASSIUM CHLORIDE 10 MEQ/50ML IV SOLN
10.0000 meq | INTRAVENOUS | Status: AC
  Administered 2015-06-29 (×6): 10 meq via INTRAVENOUS
  Filled 2015-06-29 (×6): qty 50

## 2015-06-29 MED ORDER — TRACE MINERALS CR-CU-MN-SE-ZN 10-1000-500-60 MCG/ML IV SOLN: INTRAVENOUS | Status: DC

## 2015-06-29 MED ORDER — NICARDIPINE HCL IN NACL 20-0.86 MG/200ML-% IV SOLN
3.0000 mg/h | INTRAVENOUS | Status: DC
  Administered 2015-06-29: 5 mg/h via INTRAVENOUS
  Administered 2015-06-29: 10 mg/h via INTRAVENOUS
  Filled 2015-06-29 (×3): qty 200

## 2015-06-29 MED ORDER — TRACE MINERALS CR-CU-MN-SE-ZN 10-1000-500-60 MCG/ML IV SOLN
INTRAVENOUS | Status: AC
  Administered 2015-06-29: 17:00:00 via INTRAVENOUS
  Filled 2015-06-29: qty 1992

## 2015-06-29 MED ORDER — HYDRALAZINE HCL 20 MG/ML IJ SOLN
10.0000 mg | INTRAMUSCULAR | Status: DC
  Administered 2015-06-29 – 2015-07-02 (×14): 10 mg via INTRAVENOUS
  Filled 2015-06-29 (×15): qty 1

## 2015-06-29 MED ORDER — DEXTROSE 5 % IV SOLN
INTRAVENOUS | Status: DC
  Administered 2015-06-29 (×2): via INTRAVENOUS

## 2015-06-29 MED ORDER — ONDANSETRON HCL 4 MG/2ML IJ SOLN
4.0000 mg | INTRAMUSCULAR | Status: DC
  Administered 2015-06-29 – 2015-07-03 (×24): 4 mg via INTRAVENOUS
  Filled 2015-06-29 (×24): qty 2

## 2015-06-29 MED ORDER — CETYLPYRIDINIUM CHLORIDE 0.05 % MT LIQD
7.0000 mL | Freq: Two times a day (BID) | OROMUCOSAL | Status: DC
  Administered 2015-06-29 – 2015-07-02 (×6): 7 mL via OROMUCOSAL

## 2015-06-29 NOTE — Progress Notes (Signed)
Nutrition Follow-up  DOCUMENTATION CODES:   Not applicable  INTERVENTION:  - TPN per pharmacy - RD will continue to monitor for needs  NUTRITION DIAGNOSIS:   Inadequate oral intake related to inability to eat as evidenced by NPO status. -revised, ongoing  GOAL:   Patient will meet greater than or equal to 90% of their needs -unmet currently  MONITOR:   Weight trends, Labs, Skin, I & O's, Other (Comment) (TPN regimen)  REASON FOR ASSESSMENT:   Consult  (Re-start TPN)  ASSESSMENT:   79 yo black male who has a history of stage 4 prostate cancer, HTN, DM, tachycardia, recent GI bleed. He had an endoscopy at that time of admission but apparently deferred an inpatient colonoscopy. He just had his colonoscopy done by Dr. Watt Climes on Tuesday. By report, this was normal with no source of bleeding identified. He denies any further bloody BMs sin79 yo black male who has a history of stage 4 prostate cancer, HTN, DM, tachycardia, recent GI bleed. He had an endoscopy at that time of admission but apparently deferred an inpatient colonoscopy. He just had his colonoscopy done by Dr. Watt Climes on Tuesday. By report, this was normal with no source of bleeding identified. He denies any further bloody BMs since this admission. This morning around 6 or 7 am, the patient began having centralized crampy abdominal pain. He denies nausea or vomiting, but admits to no flatus. He deals with constipation on a regular basis. Due to persistent pain, the patient presented to [facility] for further evaluation.ce this admission. This morning around 6 or 7 am, the patient began having centralized crampy abdominal pain. He denies nausea or vomiting, but admits to no flatus. He deals with constipation on a regular basis. Due to persistent pain, the patient presented to [facility] for further evaluation.  12/19 New consult for re-start of TPN received. Pt has been NPO since 06/26/15. Pt currently receiving Clinimix E 5/15  @ 70 mL/hr which is providing 84 grams protein (84% minimum estimated needs) and 1193 kcal (60% minimum estimated needs). Pharmacy indicates goal TPN: Clinimix E 5/15 @ 90 mL/hr with 20% lipids @ 10 mL/hr which will provide 108 grams protein and 2014 kcal which will meet 100% estimated protein and kcal needs.  Per pharmacy note yesterday (12/18) at 1103: At 1800 today:  Increase Clinimix E 5/15 to 70 ml/hr. Could use Clinimix 5/20 to obtain higher Kcal while withholding lipids, but due to recent uncontrolled CBGs and elevated liver enzymes (Alk Phos), will opt to use 5/15 at this time.   Withhold IV fat emulsion for 1st seven days of ICU stay per SCCM/ASPEN guidelines (today is day 6).  Continue 15 units regular insulin/24 hours in TPN.  Add Famotidine 20 mg to TPN bag. D/C IVPB order.  Plan to advance as tolerated to the goal rate.  TPN to contain standard multivitamins and trace elements.  Continue IVF per MD order.  Monitor electrolytes closely-if hypernatremia persists despite adding D5W IVF, will plan to remove electrolytes in TPN tomorrow.  Per Cardiology note 12/17 @ [phone] pt was diuresed with Lasix with 3L removed.   Pt not fully meeting needs at this time with current TPN regimen. Will monitor for TPN advancement and medical course/plan. D/c'ed Juven as pt is NPO. Medications reviewed; K Phos 10 mmoL @ 42 mL/hr, 2 grams calcium gluconate. Labs reviewed; CBGs: 82-176 mg/dL, K: 2.9 mmol/L, Ca: 6.4 mg/dL, Alk Phos elevated but trending down.    12/15 - RD was consulted  for initiation of tube feeding but pt later self extubated.  - TPN was stopped yesterday after extubation.  - Pt remains off vent and was started on clear liquids. - Pt reports doing well on clears.  - He states he does feel hungry.  - Pt is willing to try a clear liquid supplement. - Initially wanted to order Boost Breeze for patient but d/t elevated CBGs will order Juven BID instead.    Diet Order:  .TPN  (CLINIMIX-E) Adult Diet NPO time specified Except for: Ice Chips  Skin:  Wound (see comment) (Abdominal incision from 06/12/15)  Last BM:  12/17  Height:   Ht Readings from Last 1 Encounters:  06/23/15 6' (1.829 m)    Weight:   Wt Readings from Last 1 Encounters:  06/29/15 197 lb 1.5 oz (89.4 kg)    Ideal Body Weight:  80.91 kg  BMI:  Body mass index is 26.72 kg/(m^2).  Estimated Nutritional Needs:   Kcal:  2000-2200  Protein:  100-110g  Fluid:  2L/day  EDUCATION NEEDS:   No education needs identified at this time     Jarome Matin, RD, LDN Inpatient Clinical Dietitian Pager # [phone] After hours/weekend pager # [phone]

## 2015-06-29 NOTE — Progress Notes (Signed)
[facility] Progress Note Patient Name: Jeremy Jennings DOB: 04-14-1936 MRN: [id number]   Date of Service  06/29/2015  HPI/Events of Note  Multiple issues - 1. Mg++ = 1.7, K+ = 2.9 and Creatinine = 2.9. 2. Hypertension - BP = 180/71 and 3. Ca++ = 6.4 and albumin = 1.8 (Ca++ corrects to 8.16.  eICU Interventions  Will order: 1. Replete Ca++. K+ and Mg++. 2. Nicardipine IV infusion. Titrate to keep SBP < 160.     Intervention Category Major Interventions: Electrolyte abnormality - evaluation and management;Hypertension - evaluation and management  [name] Cornelia Copa 06/29/2015, 5:13 AM

## 2015-06-29 NOTE — Progress Notes (Addendum)
Patient: Jeremy Jennings / Admit Date: 06/11/2015 / Date of Encounter: 06/29/2015, 9:16 AM   Subjective: Patient sleeping. Family reports he's less restless today, appears more comfortable. No acute complaints reported when he awakens.   Objective: Telemetry: sinus tach 105-115 Physical Exam: Blood pressure 157/68, pulse 116, temperature 97.8 F (36.6 C), temperature source Oral, resp. rate 21, height 6' (1.829 m), weight 197 lb 1.5 oz (89.4 kg), SpO2 92 %. General: Well developed AAM in no acute distress. Head: Normocephalic, atraumatic, sclera non-icteric, no xanthomas, nares are without discharge. NGT in place Neck: JVP not elevated. Lungs: Coarse BS throughout. Breathing is unlabored. Heart: Reg rhythm, tachycardic, S1 S2 without murmurs, rubs, or gallops.  Abdomen: Soft, non-tender, non-distended with normoactive bowel sounds. No rebound/guarding. Extremities: No clubbing or cyanosis. Trace pedal edema bilaterally. Distal pedal pulses are 2+ and equal bilaterally. Neuro: Sleepy but arousable.   Intake/Output Summary (Last 24 hours) at 06/29/15 0916 Last data filed at 06/29/15 0800  Gross per 24 hour  Intake 2721.42 ml  Output   5440 ml  Net -2718.58 ml    Inpatient Medications:  . antiseptic oral rinse  7 mL Mouth Rinse q12n4p  . chlorhexidine  15 mL Mouth Rinse BID  . furosemide  20 mg Intravenous Daily  . heparin subcutaneous  5,000 Units Subcutaneous 3 times per day  . hydrALAZINE  10 mg Intravenous 6 times per day  . insulin aspart  0-20 Units Subcutaneous Q4H  . insulin glargine  10 Units Subcutaneous QHS  . labetalol  20 mg Intravenous Q6H  . lip balm  1 application Topical BID  . nutrition supplement (JUVEN)  1 packet Oral BID BM  . piperacillin-tazobactam (ZOSYN)  IV  3.375 g Intravenous Q8H  . potassium chloride  10 mEq Intravenous Q1 Hr x 6  . prednisoLONE acetate  1 drop Both Eyes BID   Infusions:  . Marland KitchenTPN (CLINIMIX-E) Adult 70 mL/hr at 06/28/15 2000  .  dextrose 35 mL/hr at 06/28/15 2000  . niCARDipine 7.5 mg/hr (06/29/15 0859)    Labs:  Recent Labs  06/28/15 0527 06/29/15 0400  NA 147* 144  K 3.0* 2.9*  CL 112* 108  CO2 26 27  GLUCOSE 75 187*  BUN 24* 20  CREATININE 1.01 0.93  CALCIUM 6.8* 6.4*  MG 1.9 1.7  PHOS 2.3* 2.5    Recent Labs  06/28/15 0527 06/29/15 0400  AST 27 26  ALT 19 16*  ALKPHOS 538* 417*  BILITOT 0.7 0.6  PROT 5.4* 5.2*  ALBUMIN 1.9* 1.8*    Recent Labs  06/28/15 0527 06/29/15 0400  WBC 13.2* 12.9*  NEUTROABS 11.1* 11.1*  HGB 8.9* 7.6*  HCT 27.5* 22.8*  MCV 89.6 88.7  PLT 360 302   No results for input(s): CKTOTAL, CKMB, TROPONINI in the last 72 hours. Invalid input(s): POCBNP No results for input(s): HGBA1C in the last 72 hours.   Radiology/Studies:  Dg Chest 2 View  06/19/2015  CLINICAL DATA:  Fever and weakness EXAM: CHEST  2 VIEW COMPARISON:  Four days ago FINDINGS: Persistent opacity at the left base with elevated diaphragm. Right upper extremity PICC has been placed, tip at the SVC. Normal heart size for technique. Stable upper mediastinal contours. No pulmonary edema or pleural effusion. IMPRESSION: 1. Persistent left basilar atelectasis. Given fever history, note that there could be superimposed pneumonia. 2. New right upper extremity PICC is in good position. Electronically Signed   By: Monte Fantasia M.D.   On: 06/19/2015 10:50  Dg Chest 2 View  06/15/2015  CLINICAL DATA:  Wheezing on expiration EXAM: CHEST  2 VIEW COMPARISON:  Three days ago FINDINGS: Improved lung volumes in general, but still with bandlike opacity over the left base consistent with atelectasis. The left diaphragm is elevated. Trace pleural effusions. There is also diffuse interstitial coarsening. Chronic cardiomegaly. Stable aortic and hilar contours. Nasogastric tube tip at the proximal stomach. IMPRESSION: 1. Multi segment left basilar atelectasis. 2. Pulmonary venous congestion and trace effusions. 3. Shorter  nasogastric tube with tip at the proximal stomach. Electronically Signed   By: Monte Fantasia M.D.   On: 06/15/2015 07:29   Ct Angio Chest Pe W/cm &/or Wo Cm  06/19/2015  CLINICAL DATA:  Inpatient hospitalized with central abdominal cramping and constipation. Post laparotomy and bowel resection 1 week prior. Today with fever and tachycardia. Patient with history of prostate cancer with bony metastasis. EXAM: CT ANGIOGRAPHY CHEST CT ABDOMEN AND PELVIS WITH CONTRAST TECHNIQUE: Multidetector CT imaging of the chest was performed using the standard protocol during bolus administration of intravenous contrast. Multiplanar CT image reconstructions and MIPs were obtained to evaluate the vascular anatomy. Multidetector CT imaging of the abdomen and pelvis was performed using the standard protocol during bolus administration of intravenous contrast. CONTRAST:  140mL OMNIPAQUE IOHEXOL 350 MG/ML SOLN COMPARISON:  Chest radiographs earlier this day. CT abdomen/pelvis 06/11/2015 FINDINGS: CTA CHEST FINDINGS Vascular: Suboptimal contrast bolus for evaluation of pulmonary embolus, there are no filling defects in the main, right, left, or lobar pulmonary arteries. Segmental and subsegmental branches cannot be assessed, particularly in the lower lobes where there is motion artifact. Normal caliber thoracic aorta. 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 Minimal multi chamber cardiomegaly with coronary artery calcifications. Lungs/pleura: Small bilateral pleural effusions, unchanged on the right and increased on the left from prior abdominal CT. There is adjacent compressive atelectasis at both lung bases, left greater than right. Air bronchograms on the left, with question of superimposed pneumonia. Breathing motion artifact partially obscures detailed lung evaluation. Mediastinum/lymph nodes: Prominent AP window lymph node measures 1.9 cm short axis dimension. There is  otherwise no mediastinal or hilar adenopathy. No pericardial effusion. There is a right upper extremity PICC, with tip in the distal SVC. Enteric contrast within the esophagus which is mildly tortuous. Osseous structures: Multiple sclerotic foci throughout bilateral ribs, thoracic spine, and sternum consistent with known osseous metastatic disease. CT ABDOMEN and PELVIS FINDINGS Liver: No focal hepatic lesion. Hepatobiliary: Gallbladder physiologically distended. No calcified gallstone. Pancreas: No ductal dilatation or inflammation. Spleen: Normal. Adrenal glands: No nodule. Kidneys: Symmetric renal enhancement and excretion. No hydronephrosis. Bilateral renal cysts again seen. Stomach/Bowel: Stomach physiologically distended with enteric contrast, there is wall thickening involving the fundus. Fat density lesion involving the distal gastric body measures 1.6 x 1.4 cm, unchanged, query intramural lipoma. Additional fat density lesion in the region of pylorus is also unchanged. Contrast filled prominent proximal small bowel loops without transition point. Fluid-filled and distal small bowel loops, with enteric chain sutures in the right lower quadrant. Colon is tortuous with colonic diverticulosis, no diverticulitis. Appendix remains in situ. Vascular/Lymphatic: No retroperitoneal adenopathy. Abdominal aorta is normal in caliber. Atherosclerosis of the abdominal aorta and its branches. Reproductive: Prostate gland is enlarged causing mass effect on the bladder base. Bladder: Minimally distended with mild wall thickening, likely related to degree of distention. Other: Post laparotomy with midline skin staples. No subcutaneous fluid collection. No pneumoperitoneum or free air in  the abdomen. Small amount of dependent free fluid the pelvis in mesentery. Whole body wall edema most consistent with third-spacing. Scattered foci of air in the anterior abdominal wall likely related subcutaneous injections. Musculoskeletal:  Diffuse multifocal scleroses throughout the included skeleton consistent with osseous metastatic disease. Review of the MIP images confirms the above findings. IMPRESSION: 1. Suboptimal contrast bolus for evaluation of pulmonary embolus, allowing for this, no central pulmonary embolus is seen involving the central and lobar pulmonary arteries. 2. Bilateral pleural effusions, increased on the left from prior. There is associated compressive atelectasis bilaterally, the presence of air bronchograms on the left may reflect superimposed pneumonia. 3. Prominent contrast and fluid-filled small bowel without transition point, suspect postoperative ileus. Recurrent small bowel obstruction is felt less likely. 4. Additional chronic findings as described. Electronically Signed   By: Jeb Levering M.D.   On: 06/19/2015 19:58   Ct Abdomen Pelvis W Contrast  06/19/2015  CLINICAL DATA:  Inpatient hospitalized with central abdominal cramping and constipation. Post laparotomy and bowel resection 1 week prior. Today with fever and tachycardia. Patient with history of prostate cancer with bony metastasis. EXAM: CT ANGIOGRAPHY CHEST CT ABDOMEN AND PELVIS WITH CONTRAST TECHNIQUE: Multidetector CT imaging of the chest was performed using the standard protocol during bolus administration of intravenous contrast. Multiplanar CT image reconstructions and MIPs were obtained to evaluate the vascular anatomy. Multidetector CT imaging of the abdomen and pelvis was performed using the standard protocol during bolus administration of intravenous contrast. CONTRAST:  153mL OMNIPAQUE IOHEXOL 350 MG/ML SOLN COMPARISON:  Chest radiographs earlier this day. CT abdomen/pelvis 06/11/2015 FINDINGS: CTA CHEST FINDINGS Vascular: Suboptimal contrast bolus for evaluation of pulmonary embolus, there are no filling defects in the main, right, left, or lobar pulmonary arteries. Segmental and subsegmental branches cannot be assessed, particularly in the  lower lobes where there is motion artifact. Normal caliber thoracic aorta. 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 Minimal multi chamber cardiomegaly with coronary artery calcifications. Lungs/pleura: Small bilateral pleural effusions, unchanged on the right and increased on the left from prior abdominal CT. There is adjacent compressive atelectasis at both lung bases, left greater than right. Air bronchograms on the left, with question of superimposed pneumonia. Breathing motion artifact partially obscures detailed lung evaluation. Mediastinum/lymph nodes: Prominent AP window lymph node measures 1.9 cm short axis dimension. There is otherwise no mediastinal or hilar adenopathy. No pericardial effusion. There is a right upper extremity PICC, with tip in the distal SVC. Enteric contrast within the esophagus which is mildly tortuous. Osseous structures: Multiple sclerotic foci throughout bilateral ribs, thoracic spine, and sternum consistent with known osseous metastatic disease. CT ABDOMEN and PELVIS FINDINGS Liver: No focal hepatic lesion. Hepatobiliary: Gallbladder physiologically distended. No calcified gallstone. Pancreas: No ductal dilatation or inflammation. Spleen: Normal. Adrenal glands: No nodule. Kidneys: Symmetric renal enhancement and excretion. No hydronephrosis. Bilateral renal cysts again seen. Stomach/Bowel: Stomach physiologically distended with enteric contrast, there is wall thickening involving the fundus. Fat density lesion involving the distal gastric body measures 1.6 x 1.4 cm, unchanged, query intramural lipoma. Additional fat density lesion in the region of pylorus is also unchanged. Contrast filled prominent proximal small bowel loops without transition point. Fluid-filled and distal small bowel loops, with enteric chain sutures in the right lower quadrant. Colon is tortuous with colonic diverticulosis, no diverticulitis. Appendix remains  in situ. Vascular/Lymphatic: No retroperitoneal adenopathy. Abdominal aorta is normal in caliber. Atherosclerosis of the abdominal aorta and its branches. Reproductive:  Prostate gland is enlarged causing mass effect on the bladder base. Bladder: Minimally distended with mild wall thickening, likely related to degree of distention. Other: Post laparotomy with midline skin staples. No subcutaneous fluid collection. No pneumoperitoneum or free air in the abdomen. Small amount of dependent free fluid the pelvis in mesentery. Whole body wall edema most consistent with third-spacing. Scattered foci of air in the anterior abdominal wall likely related subcutaneous injections. Musculoskeletal: Diffuse multifocal scleroses throughout the included skeleton consistent with osseous metastatic disease. Review of the MIP images confirms the above findings. IMPRESSION: 1. Suboptimal contrast bolus for evaluation of pulmonary embolus, allowing for this, no central pulmonary embolus is seen involving the central and lobar pulmonary arteries. 2. Bilateral pleural effusions, increased on the left from prior. There is associated compressive atelectasis bilaterally, the presence of air bronchograms on the left may reflect superimposed pneumonia. 3. Prominent contrast and fluid-filled small bowel without transition point, suspect postoperative ileus. Recurrent small bowel obstruction is felt less likely. 4. Additional chronic findings as described. Electronically Signed   By: Jeb Levering M.D.   On: 06/19/2015 19:58   Ct Abdomen Pelvis W Contrast  06/11/2015  ADDENDUM REPORT: 06/11/2015 12:31 ADDENDUM: The salient findings were discussed with Zenovia Jarred on 06/11/2015 at 12:31 pm. Electronically Signed   By: Nolon Nations M.D.   On: 06/11/2015 12:31  06/11/2015  CLINICAL DATA:  abdominal pain awoke him this am. Denies n/v/d or fevers. No BM since this past Sunday. History of prostate cancer. ^135mL OMNIPAQUE IOHEXOL 300  MG/ML SOLN EXAM: CT ABDOMEN AND PELVIS WITH CONTRAST TECHNIQUE: Multidetector CT imaging of the abdomen and pelvis was performed using the standard protocol following bolus administration of intravenous contrast. CONTRAST:  163mL OMNIPAQUE IOHEXOL 300 MG/ML  SOLN COMPARISON:  Lower chest: There bilateral pleural effusions. Bibasilar atelectasis. Coronary artery calcifications are present. Heart size appears normal. Upper abdomen: No focal abnormality identified within the liver, spleen, pancreas, or adrenal glands. Gallbladder is present. Bilateral renal cysts are present. No intrarenal or ureteral stones. Gastrointestinal tract: There are numerous dilated proximal small bowel loops contain contrast. In the left central lower abdomen there is a non-opacified loop of small bowel with surrounding mesenteric fluid. Asleep is felt to be the point of obstruction likely represents a closed loop obstruction. The small bowel loops distal to this level are relatively collapsed. There are numerous colonic diverticula but no evidence for acute diverticulitis. The colon is redundant. The appendix is well seen and has a normal appearance. Pelvis: The prostate is enlarged. Urinary bladder otherwise has a normal appearance. No free pelvic fluid. No pelvic adenopathy. Retroperitoneum: Atherosclerotic calcification identified within the aorta. Abdominal wall: Fat containing umbilical hernia. Osseous structures: Interval progression of significant osseous metastatic disease.2 diffuse sclerotic metastases involve the pelvis, proximal femurs, and all of the visualized vertebrae. FINDINGS: 1. Findings consistent with closed loop obstruction of the mid to distal small bowel. Given the presence of surrounding mesenteric fluid, perforation should be considered. There is no free intraperitoneal air however. 2. Coronary artery disease. 3. Normal appendix. 4. Colonic diverticulosis. 5. Prostatic enlargement. 6. Umbilical hernia. 7.  Progression of diffuse sclerotic metastases. Electronically Signed: By: Nolon Nations M.D. On: 06/11/2015 12:21   Dg Chest Port 1 View  06/29/2015  CLINICAL DATA:  Ileus.  Hypertension. EXAM: PORTABLE CHEST 1 VIEW COMPARISON:  06/27/2015. FINDINGS: Left IJ line, right PICC line, NG tube in stable position. Mediastinum hilar structures are normal. Cardiomegaly with bilateral pulmonary interstitial prominence noted. Findings  consistent with congestive heart failure. Pneumonitis cannot be excluded. Persistent low lung volumes with bibasilar atelectasis. No pleural effusion or pneumothorax. IMPRESSION: 1. Lines and tubes in stable position. 2. Cardiomegaly with bilateral mild pulmonary interstitial prominence suggesting mild congestive heart failure. 3. Persistent low lung volumes with bibasilar atelectasis. Electronically Signed   By: [name]  Register   On: 06/29/2015 07:12   Dg Chest Port 1 View  06/27/2015  CLINICAL DATA:  Shortness of breath, abdominal distension, nasogastric tube placement, hypertension, type II diabetes mellitus, prostate cancer, former smoker EXAM: PORTABLE CHEST 1 VIEW COMPARISON:  Portable exam 1023 hours compared to 06/25/2015 FINDINGS: Nasogastric tube extends into stomach. RIGHT arm PICC line tip projects over SVC. LEFT jugular central venous catheter tip projects over confluence of LEFT brachiocephalic vein and SVC. Rotated to the RIGHT. Enlargement of cardiac silhouette. Atherosclerotic calcification aorta. Bibasilar atelectasis. Improved perihilar infiltrates. No pleural effusion or pneumothorax. Gaseous distention of bowel in LEFT upper quadrant. IMPRESSION: Bibasilar atelectasis. Enlargement of cardiac silhouette. Improved pulmonary infiltrates. Electronically Signed   By: Lavonia Dana M.D.   On: 06/27/2015 11:16   Dg Chest Port 1 View  06/25/2015  CLINICAL DATA:  Respiratory failure. EXAM: PORTABLE CHEST 1 VIEW COMPARISON:  06/24/2015. FINDINGS: Interim extubation and  removal of NG tube . Right PICC line, left IJ line stable position. Cardiomegaly. Persistent interstitial prominence, interstitial edema cannot be excluded. Persistent low lung volumes with right mid lung field and left base atelectasis and/or infiltrates. No pleural effusion or pneumothorax. IMPRESSION: 1. Interim extubation removal of NG tube. Left IJ line and right PICC line stable position. 2. Persistent cardiomegaly pulmonary interstitial prominence suggesting interstitial edema. No interim change. 3. Persistent low lung volumes with right mid lung and left base subsegmental atelectasis. Electronically Signed   By: [place]   On: 06/25/2015 07:18   Dg Chest Port 1 View  06/24/2015  CLINICAL DATA:  Respiratory failure. EXAM: PORTABLE CHEST 1 VIEW COMPARISON:  06/23/2015. FINDINGS: Endotracheal tube, NG tube, left IJ line, right PICC line in stable position. Mediastinum hilar structures are stable. Cardiomegaly with diffuse bilateral pulmonary interstitial prominence consistent with congestive heart failure. Pneumonitis cannot be excluded. Persistent right mid lung and left base subsegmental atelectasis. No prominent pleural effusion. No pneumothorax. IMPRESSION: 1. Lines and tubes in stable position. 2. Persistent cardiomegaly. Interim appearance of bilateral from interstitial prominence. Findings suggest congestive heart failure. 3. Persistent low lung volumes with persistent right mid lung and left base subsegmental atelectasis. Electronically Signed   By: [name]  Register   On: 06/24/2015 07:18   Dg Chest Port 1 View  06/23/2015  CLINICAL DATA:  Central line placement EXAM: PORTABLE CHEST 1 VIEW COMPARISON:  06/23/2015 FINDINGS: Left internal jugular central line has been placed with the tip in the upper SVC. No pneumothorax. Endotracheal tube is 3 cm above the carina. NG tube is in the stomach. Right PICC line is unchanged. Patchy right upper lobe and left lower lobe airspace opacities are  again noted, unchanged. Low lung volumes. IMPRESSION: Left central line placement with the tip in the upper SVC. No pneumothorax. Otherwise no significant change. Electronically Signed   By: Rolm Baptise M.D.   On: 06/23/2015 17:15   Dg Chest Port 1 View  06/23/2015  CLINICAL DATA:  ET tube repositioning EXAM: PORTABLE CHEST 1 VIEW COMPARISON:  06/23/2015 FINDINGS: Endotracheal tube has been retracted and is approximately 2 cm above the carina. Right PICC line is unchanged as is the NG tube. There is  mild cardiomegaly. Left lower lobe atelectasis or infiltrate noted. Patchy right upper lobe opacity also noted. These are unchanged since prior study. IMPRESSION: Interval retraction of the endotracheal tube, now approximately 2 cm above the carina. Stable patchy right upper lobe and left lower lobe airspace opacities. Electronically Signed   By: Rolm Baptise M.D.   On: 06/23/2015 16:29   Dg Chest Port 1 View  06/23/2015  CLINICAL DATA:  Status post endotracheal intubation EXAM: PORTABLE CHEST - 1 VIEW COMPARISON:  06/23/2015 FINDINGS: Cardiac shadow is stable. Some mild atelectatic changes remain in the left lung base and right mid lung. Nasogastric catheter is been placed but is coiled at the level of the diaphragm in may lie within a hiatal hernia. A right-sided PICC line is noted at the cavoatrial junction. An endotracheal tube is seen at the level of the carina directing slightly into the right mainstem bronchus. This should be withdrawn approximately 4 cm. No other focal abnormality is noted. IMPRESSION: Tubes and lines as described above. The endotracheal tube should be withdrawn 4 cm. Bilateral patchy atelectatic changes. These results were called by telephone at the time of interpretation on 06/23/2015 at 2:14 pm to [facility], the patients nurse Who verbally acknowledged these results. Electronically Signed   By: Inez Catalina M.D.   On: 06/23/2015 14:19   Dg Chest Port 1 View  06/23/2015  CLINICAL DATA:   Acute onset of shortness of breath and decreased O2 saturation. Initial encounter. EXAM: PORTABLE CHEST 1 VIEW COMPARISON:  Chest radiograph and CT of the chest performed 06/19/2015 FINDINGS: The lungs are mildly hypoexpanded. Left basilar airspace opacity raises concern for pneumonia. There is no evidence of pleural effusion or pneumothorax. The cardiomediastinal silhouette is borderline normal in size. No acute osseous abnormalities are seen. IMPRESSION: Lungs mildly hypoexpanded. Left basilar airspace opacity is concerning for pneumonia. Electronically Signed   By: Garald Balding M.D.   On: 06/23/2015 05:17   Portable Chest Xray - Atelectasis  06/12/2015  CLINICAL DATA:  Aspiration of vomitus, initial encounter. EXAM: PORTABLE CHEST 1 VIEW COMPARISON:  Nov 09, 2014. FINDINGS: Stable cardiomediastinal silhouette. Hypoinflation of the lungs is noted. Nasogastric tube tip is seen in proximal stomach. No pneumothorax is noted. Mild central pulmonary vascular congestion is noted. Small amount of fluid is noted in the right minor fissure. Left basilar opacity is noted concerning for pneumonia or atelectasis with associated pleural effusion. Bony thorax is unremarkable. IMPRESSION: Hypoinflation of the lungs is noted. Mild central pulmonary vascular congestion is noted. Left basilar opacity is noted concerning for pneumonia or atelectasis with associated pleural effusion. Electronically Signed   By: Marijo Conception, M.D.   On: 06/12/2015 16:43   Dg Abd 2 Views  06/28/2015  CLINICAL DATA:  Ileus following gastrointestinal surgery, hypertension, diabetes mellitus, history prostate cancer EXAM: ABDOMEN - 2 VIEW COMPARISON:  06/27/2015 FINDINGS: Tip of nasogastric tube projects over stomach. Skin clips project over lower abdomen. Rotated to the RIGHT. Air-filled loops of large and small bowel without significant distention. Small amount of retained contrast in distal colon. No bowel wall thickening or evidence of  obstruction. Paper clip projects over the RIGHT hip region, question external to patient. IMPRESSION: Nonspecific bowel gas pattern. Electronically Signed   By: Lavonia Dana M.D.   On: 06/28/2015 12:55   Dg Abd Portable 1v  06/27/2015  CLINICAL DATA:  Abdominal distention today.  NG tube placement. EXAM: PORTABLE ABDOMEN - 1 VIEW COMPARISON:  06/19/2015 and prior CTs. 06/12/2015 and  prior radiographs. FINDINGS: An NG tube is identified with tip overlying the mid stomach. Mild gaseous distention of colon and small bowel again noted. Surgical staples overlying the abdomen and pelvis are again noted. No other significant changes noted. IMPRESSION: NG tube with tip overlying the mid stomach. Continued mild gaseous distension of small bowel and colon-question ileus. Electronically Signed   By: Margarette Canada M.D.   On: 06/27/2015 10:45   Dg Abd Portable 1v  06/12/2015  CLINICAL DATA:  Nasogastric tube placement EXAM: PORTABLE ABDOMEN - 1 VIEW COMPARISON:  Yesterday FINDINGS: Advancement of nasogastric tube with tip in the peripatellar region. Unchanged dilation of small bowel. There is a new lucency over the epigastrium and right abdomen which may outline the falciform ligament and the outer wall of a small bowel loop. Critical Value/emergent results were called by telephone at the time of interpretation on 06/12/2015 at 8:07 am to Dr. Saverio Danker , who verbally acknowledged these results. IMPRESSION: 1. Possible new pneumoperitoneum. Depending on exam, confirmation could be obtained by CT or upright/decubitus imaging. 2. Advanced nasogastric tube with tip at the pylorus. 3. Unchanged small bowel distention. Electronically Signed   By: Monte Fantasia M.D.   On: 06/12/2015 08:08   Dg Abd Portable 1v-small Bowel Obstruction Protocol-initial, 8 Hr Delay  06/11/2015  CLINICAL DATA:  NG tube placement EXAM: PORTABLE ABDOMEN - 1 VIEW COMPARISON:  CT of the abdomen and pelvis from the same day. FINDINGS: Side port of an  NG tube is just beyond the GE junction. Lung bases are clear. Multiple dilated loops of small bowel are again seen. IMPRESSION: 1. The side port of the NG tube is just beyond the GE junction and could be advanced several cm for more optimal positioning. 2. Persistent dilation of multiple loops of small bowel. Electronically Signed   By: San Morelle M.D.   On: 06/11/2015 17:17     Assessment and Plan  11M with stage 4 prostate CA, HTN, DM, recent GIB, chronic-appearing sinus tach (with PACs) admitted with SBO. S/p ex-lap/LOA [id number], initially complicated by post-op ileus, fever, enterococcal UTI, sinus tachycardia. LE duplex/CTA 12/9 neg for DVT/PE. 2D Echo 06/19/15: mod LVH, mid LV cavity systolic obliteration with increased mid-cavitary gradient, EF 65%, PASP 34. Developed ventilator dependent respiratory failure on 06/23/15 a/w bradycardic to PEA arrest - time to ROSC 6 minutes, received 2 amps of epinephrine - felt to be primary respiratory event. Has also had acute encephalopathy with periodic confusion this admission. Cardiology following for acute on chronic diastolic CHF and sinus tach.  1. Acute on chronic diastolic CHF with hypertensive heart disease - inpatient weights are extremely variable, now down to 197 which is the lowest weight I can find in Epic for quite some time. CXR still with mild CHF.  Suspect component of his edema is due to severe hypoalbuminemia. Will review plan for diuretic with MD (currently on Lasix 20mg  IV daily with stable renal function) - continues to diurese briskly on this dose.  2. Chronic sinus tachycardia with PACs - on higher-dose diltiazem at home (360mg  daily) as well as carvedilol 12.5mg  BID.  Now on IV labetalol/nicardipine per primary team as of overnight/this AM.  3. Essential HTN - on meds as above (nicardipine started today) along with IV hydralazine which was titrated by primary team today to QID.  4. PEA arrest 06/23/15 - felt to be primary resp  event. Troponin after event was negative.  5. Chronic anemia - baseline Hgb appears 7-9.  6. Hypokalemia/hypocalcemia - lytes being managed by primary team.  Signed, Melina Copa PA-C Pager: [phone]  The patient is sitting up in bed.  He is lethargic.  His eyes open only briefly and then closed again.  Unable to have any conversation with him.  His rhythm remains normal sinus rhythm.  Examination reveals mild peripheral edema.  His serum albumin is 1.8 which is certainly contributing to his peripheral edema.  His chest x-ray was personally reviewed and does show some evidence of pulmonary vascular congestion.  His blood pressure is mildly elevated.  His renal function is normal.  However he is very hypokalemic.  Would consider increasing his diuretic dose once his electrolytes are stabilized.

## 2015-06-29 NOTE — Progress Notes (Signed)
PARENTERAL NUTRITION CONSULT NOTE - FOLLOW-UP  Pharmacy Consult for TPN Indication: Prolonged Ileus  No Known Allergies  Patient Measurements: Height: 6' (182.9 cm) Weight: 197 lb 1.5 oz (89.4 kg) IBW/kg (Calculated) : 77.6 Adjusted Body Weight: 82.5 kg  Vital Signs: Temp: 97.8 F (36.6 C) (12/19 0800) Temp Source: Oral (12/19 0800) BP: 132/48 mmHg (12/19 0750) Pulse Rate: 117 (12/19 0700) Intake/Output from previous day: 12/18 0701 - 12/19 0700 In: 2621.4 [I.V.:568.8; NG/GT:60; IV Piggyback:500; TPN:1292.7] Out: 5240 [Urine:4940; Emesis/NG output:300] Intake/Output from this shift: Total I/O In: 100 [IV Piggyback:100] Out: 200 [Urine:200]  Labs:  Recent Labs  06/28/15 0527 06/29/15 0400  WBC 13.2* 12.9*  HGB 8.9* 7.6*  HCT 27.5* 22.8*  PLT 360 302     Recent Labs  06/27/15 0545 06/27/15 0941 06/28/15 0527 06/29/15 0400  NA 143  --  147* 144  K 3.5  --  3.0* 2.9*  CL 110  --  112* 108  CO2 23  --  26 27  GLUCOSE 240*  --  75 187*  BUN 33*  --  24* 20  CREATININE 1.23  --  1.01 0.93  CALCIUM 6.7*  --  6.8* 6.4*  MG  --  1.9 1.9 1.7  PHOS  --  2.3* 2.3* 2.5  PROT  --   --  5.4* 5.2*  ALBUMIN  --   --  1.9* 1.8*  AST  --   --  27 26  ALT  --   --  19 16*  ALKPHOS  --   --  538* 417*  BILITOT  --   --  0.7 0.6  PREALBUMIN  --   --  17.9*  --   TRIG  --   --  150* 161*   Estimated Creatinine Clearance: 73 mL/min (by C-G formula based on Cr of 0.93).    Recent Labs  06/28/15 2351 06/29/15 0404 06/29/15 0742  GLUCAP 109* 165* 176*    Medical History: Past Medical History  Diagnosis Date  . Essential hypertension   . Type 2 diabetes mellitus ([place])   . Hyperlipidemia   . Prostate cancer ([place])   . History of GI bleed   . Tachycardia   . PAC (premature atrial contraction)     Frequent   Insulin Requirements: 7 units SSI/24h Lantus 16 units  Regular Insulin 10 units in TPN  Current Nutrition: NPO (unable to take Juven powder nutritional  supplements currently)  IVF: D5W at 35 ml/hr  Central access: PICC placed 12/6 TPN start date: 12/6-12/14, resumed 12/17  ASSESSMENT                                                                                                          HPI: 73 y/oM with widely metastatic prostate cancer who presented 12/1 with abdominal pain nausea and vomiting. CT scan concerning for SBO. Patient underwent emergency laparotomy with small bowel resection on 12/2. Patient developed post-op ileus. CCS  consulted pharmacy to begin TPN for prolonged ileus on 12/6, and patient remained on TPN until  12/14. Asked to resume TPN 12/17 for nutrition support as patient with increased n/v, unable to take POs.  Significant events:  12/10: transferred to ICU 12/11: significant increase in SCr, CBG's remain elevated 12/13: start soft diet, orders to wean TPN to off **afternoon update**PEA arrest, pt intubated, consulted to continue TPN 12/14: self extubated, TPN weaned off 12/17: more nausea and emesis, not able to take POs. NGT placed for persistent ileus. Resume TPN  Today:   Glucose -  CBGs 109-176, (goal < 150), last dose of steroids 12/16  Electrolytes - K+ low at 2.9, Corrected Calcium low at 8.16, Mg = 1.7, Phos low end normal,  Renal - SCr improved WNL  24h I/O =  -2.6L (NG = 343m).  Cardiology note 12/18 states LE edema present, on lasix 210mIV daily (previously BID)  LFTs - AST improved to WNL, ALT WNL, Tbili WNL, Alk Phos elevated at 538, but trending down  TGs - 157 (12/7), 149 (12/12), 178 (12/14), 150 (12/18), 161 (12/19)  Prealbumin - 3.5 (12/6), 4.4 (12/7), 2.4 (12/12), 4.8 (12/14), 17.9 (12/18)  NUTRITIONAL GOALS                                                                                             RD recs (revised 06/25/15): Kcal: 2000-2200, Protein: 100-110g  Clinimix 5/15 at a goal rate of 83 ml/hr + 20% fat emulsion at 10 ml/hr to provide: 100g/day protein, 1894Kcal/day (meeting  100% protein needs and 95% kcal needs).  PLAN                                                                                                                            Calcium, magnesium and potassium replacements as order by PCCM  At 1800 today:  Increase Clinimix E 5/15 to goal of 83 ml/hr.   Fat emulsions 20% at 10 ml/hr  Do not see reason to withhold lipids for first week for ICU patient since he was recently on TPN in ICU (until 12/14) then it was stopped x 2 days then resumed.   Increase to 20 units regular insulin/24 hours in TPN. Increase for increase TPN rate/dextrose  continue Famotidine 20 mg to TPN bag. D/C IVPB order.  TPN to contain standard multivitamins and trace elements.  Red[facility]  [facility]  Continue resistant SSI q4h. Lantus reduced to 10 units qHS per MD.   TPN lab panels on Mondays & Thursdays.  F/u daily.   DuDoreene ElandPharmD, BCPS.   Pager: [phone]/19/2016 8:21 AM

## 2015-06-29 NOTE — Progress Notes (Signed)
CRITICAL VALUE ALERT  Critical value received:  Calcium 6.4  Date of notification:  06/29/15  Time of notification:  0500  Critical value read back:Yes.    Nurse who received alert:  M. Milicent Acheampong  MD notified (1st page):  Dr. Oletta Darter  Time of first page:  0500  MD notified (2nd page):  Time of second page:  Responding MD:  Dr. Oletta Darter  Time MD responded:  0500

## 2015-06-29 NOTE — Progress Notes (Signed)
Date: June 29, 2015 Chart reviewed for concurrent status and case management needs. Will continue to follow patient for changes and needs: Velva Harman, RN, BSN, [place]   [phone]

## 2015-06-29 NOTE — Progress Notes (Signed)
[place] continues to follow Jeremy Jennings as reqeusted to support home care services and Pharmacy services for home TNA if ordered for home.  [facility] hospital team will continue to follow until DC and be available to support as needed for transition of care.    If patient discharges after hours, please call [phone].   [name] 06/29/2015, 11:40 PM

## 2015-06-29 NOTE — Progress Notes (Signed)
*  PRELIMINARY RESULTS* Echocardiogram 2D Echocardiogram has been performed.  Leavy Cella 06/29/2015, 1:02 PM

## 2015-06-29 NOTE — Progress Notes (Signed)
CSW contacted pt's daughter, as requested, to provide info on Advanced Directives. Ms. Jeremy Jennings will speak with pt regarding advanced directives then contact CSW for further assistance.  Werner Lean LCSW [phone]

## 2015-06-29 NOTE — Progress Notes (Signed)
Patient ID: Jeremy Jennings, male   DOB: Dec 03, 1935, 79 y.o.   MRN: [id number] 17 Days Post-Op  Subjective: Pt sleeping but awakens and says he feels ok.  Daughter and her husband at the bedside.  Objective: Vital signs in last 24 hours: Temp:  [97.8 F (36.6 C)-102.1 F (38.9 C)] 97.8 F (36.6 C) (12/19 0800) Pulse Rate:  [101-131] 116 (12/19 0800) Resp:  [17-36] 21 (12/19 0800) BP: (132-194)/(48-105) 157/68 mmHg (12/19 0800) SpO2:  [89 %-100 %] 92 % (12/19 0800) Weight:  [89.4 kg (197 lb 1.5 oz)] 89.4 kg (197 lb 1.5 oz) (12/19 0500) Last BM Date: 06/27/15  Intake/Output from previous day: 12/18 0701 - 12/19 0700 In: 2621.4 [I.V.:568.8; NG/GT:60; IV Piggyback:500; TPN:1292.7] Out: 5240 [Urine:4940; Emesis/NG output:300] Intake/Output this shift: Total I/O In: 100 [IV Piggyback:100] Out: 200 [Urine:200]  PE: Abd: soft, +BS, minimal distention, NGT with no output currently, midline incision is c/d/i with staples Heart: tachy Lungs: Course sounds, but likely upper airway noises, otherwise CTA  Lab Results:   Recent Labs  06/28/15 0527 06/29/15 0400  WBC 13.2* 12.9*  HGB 8.9* 7.6*  HCT 27.5* 22.8*  PLT 360 302   BMET  Recent Labs  06/28/15 0527 06/29/15 0400  NA 147* 144  K 3.0* 2.9*  CL 112* 108  CO2 26 27  GLUCOSE 75 187*  BUN 24* 20  CREATININE 1.01 0.93  CALCIUM 6.8* 6.4*   PT/INR No results for input(s): LABPROT, INR in the last 72 hours. CMP     Component Value Date/Time   NA 144 06/29/2015 0400   NA 142 05/18/2015 0838   K 2.9* 06/29/2015 0400   K 3.5 05/18/2015 0838   CL 108 06/29/2015 0400   CO2 27 06/29/2015 0400   CO2 18* 05/18/2015 0838   GLUCOSE 187* 06/29/2015 0400   GLUCOSE 84 05/18/2015 0838   BUN 20 06/29/2015 0400   BUN 18.3 05/18/2015 0838   CREATININE 0.93 06/29/2015 0400   CREATININE 0.8 05/18/2015 0838   CALCIUM 6.4* 06/29/2015 0400   CALCIUM 8.4 05/18/2015 0838   PROT 5.2* 06/29/2015 0400   PROT 6.5 05/18/2015 0838   ALBUMIN 1.8* 06/29/2015 0400   ALBUMIN 2.4* 05/18/2015 0838   AST 26 06/29/2015 0400   AST 18 05/18/2015 0838   ALT 16* 06/29/2015 0400   ALT <9 05/18/2015 0838   ALKPHOS 417* 06/29/2015 0400   ALKPHOS 488* 05/18/2015 0838   BILITOT 0.6 06/29/2015 0400   BILITOT 0.33 05/18/2015 0838   GFRNONAA >60 06/29/2015 0400   GFRAA >60 06/29/2015 0400   Lipase     Component Value Date/Time   LIPASE 46 11/09/2014 1137       Studies/Results: Dg Chest Port 1 View  06/29/2015  CLINICAL DATA:  Ileus.  Hypertension. EXAM: PORTABLE CHEST 1 VIEW COMPARISON:  06/27/2015. FINDINGS: Left IJ line, right PICC line, NG tube in stable position. Mediastinum hilar structures are normal. Cardiomegaly with bilateral pulmonary interstitial prominence noted. Findings consistent with congestive heart failure. Pneumonitis cannot be excluded. Persistent low lung volumes with bibasilar atelectasis. No pleural effusion or pneumothorax. IMPRESSION: 1. Lines and tubes in stable position. 2. Cardiomegaly with bilateral mild pulmonary interstitial prominence suggesting mild congestive heart failure. 3. Persistent low lung volumes with bibasilar atelectasis. Electronically Signed   By: [name]  Register   On: 06/29/2015 07:12   Dg Chest Port 1 View  06/27/2015  CLINICAL DATA:  Shortness of breath, abdominal distension, nasogastric tube placement, hypertension, type II diabetes mellitus, prostate  cancer, former smoker EXAM: PORTABLE CHEST 1 VIEW COMPARISON:  Portable exam 1023 hours compared to 06/25/2015 FINDINGS: Nasogastric tube extends into stomach. RIGHT arm PICC line tip projects over SVC. LEFT jugular central venous catheter tip projects over confluence of LEFT brachiocephalic vein and SVC. Rotated to the RIGHT. Enlargement of cardiac silhouette. Atherosclerotic calcification aorta. Bibasilar atelectasis. Improved perihilar infiltrates. No pleural effusion or pneumothorax. Gaseous distention of bowel in LEFT upper quadrant.  IMPRESSION: Bibasilar atelectasis. Enlargement of cardiac silhouette. Improved pulmonary infiltrates. Electronically Signed   By: Lavonia Dana M.D.   On: 06/27/2015 11:16   Dg Abd 2 Views  06/28/2015  CLINICAL DATA:  Ileus following gastrointestinal surgery, hypertension, diabetes mellitus, history prostate cancer EXAM: ABDOMEN - 2 VIEW COMPARISON:  06/27/2015 FINDINGS: Tip of nasogastric tube projects over stomach. Skin clips project over lower abdomen. Rotated to the RIGHT. Air-filled loops of large and small bowel without significant distention. Small amount of retained contrast in distal colon. No bowel wall thickening or evidence of obstruction. Paper clip projects over the RIGHT hip region, question external to patient. IMPRESSION: Nonspecific bowel gas pattern. Electronically Signed   By: Lavonia Dana M.D.   On: 06/28/2015 12:55   Dg Abd Portable 1v  06/27/2015  CLINICAL DATA:  Abdominal distention today.  NG tube placement. EXAM: PORTABLE ABDOMEN - 1 VIEW COMPARISON:  06/19/2015 and prior CTs. 06/12/2015 and prior radiographs. FINDINGS: An NG tube is identified with tip overlying the mid stomach. Mild gaseous distention of colon and small bowel again noted. Surgical staples overlying the abdomen and pelvis are again noted. No other significant changes noted. IMPRESSION: NG tube with tip overlying the mid stomach. Continued mild gaseous distension of small bowel and colon-question ileus. Electronically Signed   By: Margarette Canada M.D.   On: 06/27/2015 10:45    Anti-infectives: Anti-infectives    Start     Dose/Rate Route Frequency Ordered Stop   06/23/15 1700  vancomycin (VANCOCIN) IVPB 1000 mg/200 mL premix  Status:  Discontinued     1,000 mg 200 mL/hr over 60 Minutes Intravenous Every 12 hours 06/23/15 1414 06/26/15 1000   06/23/15 1430  piperacillin-tazobactam (ZOSYN) IVPB 3.375 g     3.375 g 12.5 mL/hr over 240 Minutes Intravenous Every 8 hours 06/23/15 1416 06/29/15 2359   06/23/15 1415   piperacillin-tazo (ZOSYN) NICU IV syringe 200 mg/mL  Status:  Discontinued     75 mg/kg  105.4 kg 79 mL/hr over 30 Minutes Intravenous Every 8 hours 06/23/15 1401 06/23/15 1416   06/23/15 1000  levofloxacin (LEVAQUIN) tablet 750 mg  Status:  Discontinued     750 mg Oral Daily 06/23/15 0750 06/23/15 1401   06/22/15 1600  vancomycin (VANCOCIN) IVPB 1000 mg/200 mL premix  Status:  Discontinued     1,000 mg 200 mL/hr over 60 Minutes Intravenous Every 12 hours 06/22/15 1346 06/23/15 0740   06/20/15 0200  vancomycin (VANCOCIN) 1,250 mg in sodium chloride 0.9 % 250 mL IVPB  Status:  Discontinued     1,250 mg 166.7 mL/hr over 90 Minutes Intravenous Every 12 hours 06/19/15 1314 06/22/15 1346   06/19/15 1400  vancomycin (VANCOCIN) 2,000 mg in sodium chloride 0.9 % 500 mL IVPB     2,000 mg 250 mL/hr over 120 Minutes Intravenous  Once 06/19/15 1313 06/19/15 1737   06/19/15 1315  piperacillin-tazobactam (ZOSYN) IVPB 3.375 g  Status:  Discontinued     3.375 g 12.5 mL/hr over 240 Minutes Intravenous Every 8 hours 06/19/15 1305 06/23/15 0740  06/19/15 1100  cefTRIAXone (ROCEPHIN) 2 g in dextrose 5 % 50 mL IVPB  Status:  Discontinued     2 g 100 mL/hr over 30 Minutes Intravenous Every 24 hours 06/19/15 1020 06/19/15 1246   06/12/15 0930  cefoTEtan (CEFOTAN) 2 g in dextrose 5 % 50 mL IVPB     2 g 100 mL/hr over 30 Minutes Intravenous On call to O.R. 06/12/15 0852 06/12/15 1359       Assessment/Plan POD#17 exlap with SBR for strangulated bowel and internal hernia---Dr. [name] 06/12/15 -NGT to suction with minimal output.  Not much out, can likely look at DC this maybe tomorrow -DC staples Acute respiratory failure- -s/p respiratory arrest  -per CCM, appreciate their assistance ID-on zosyn 12/9----> For PNA/sepsis DM II-CBGs, SSI CV-PEA arrest [id number], ST, diastolic heart failure. Diuresing, Appreciate cards assistance.  Stage 4 prostate cancer -will send a message to Dr. Alen Blew today per  family.  They would like to know about treatment for his Ca.  I informed them it's likely they will not pursue any other treatments while in the hospital, but will defer that to onc. FEN-cont IV fluids, NGT VTE prophylaxis-SCD/heparin.    LOS: 18 days    Trinten Boudoin E 06/29/2015, 8:58 AM Pager: [phone]

## 2015-06-29 NOTE — Progress Notes (Signed)
[facility] Progress Note Patient Name: JADENN BROOKBANK DOB: 1936-02-23 MRN: [id number]   Date of Service  06/29/2015  HPI/Events of Note  HTN - BP = 175/77.  eICU Interventions  Will increase Hydralazine 10 mg IV from Q 8 hours to Q 4 hours.      Intervention Category Intermediate Interventions: Hypertension - evaluation and management  Clydene Burack Eugene 06/29/2015, 4:29 AM

## 2015-06-29 NOTE — Progress Notes (Signed)
PT Cancellation Note  Patient Details Name: Jeremy Jennings MRN: [id number] DOB: 06/29/1936   Cancelled Treatment:     spoke to RN.  No PT today on Cardene drip.   Nathanial Rancher 06/29/2015, 8:42 AM

## 2015-06-29 NOTE — Progress Notes (Signed)
PULMONARY / CRITICAL CARE MEDICINE   Name: Jeremy Jennings MRN: [id number] DOB: [birth year]    ADMISSION DATE:  06/11/2015 CONSULTATION DATE:  06/23/15  REFERRING MD :  CCS  CHIEF COMPLAINT:  Acute respiratory failure, cardiac arrest   SUBJECTIVE: Pt reports feeling very tired, minimal sleep overnight.  Increased belching and nausea.  NGT to LIS.  Net 2.6L neg.  Cardene gtt weaned off am 12/19   VITAL SIGNS: RA   Temp:  [97.8 F (36.6 C)-102.1 F (38.9 C)] 97.8 F (36.6 C) (12/19 0800) Pulse Rate:  [101-131] 116 (12/19 0800) Resp:  [17-36] 21 (12/19 0800) BP: (132-194)/(48-105) 157/68 mmHg (12/19 0800) SpO2:  [89 %-100 %] 92 % (12/19 0800) Weight:  [197 lb 1.5 oz (89.4 kg)] 197 lb 1.5 oz (89.4 kg) (12/19 0500)   HEMODYNAMICS:      INTAKE / OUTPUT:  Intake/Output Summary (Last 24 hours) at 06/29/15 0911 Last data filed at 06/29/15 0800  Gross per 24 hour  Intake 2721.42 ml  Output   5440 ml  Net -2718.58 ml    PHYSICAL EXAMINATION: General:  Chronically ill appearing elderly male in NAD Neuro:  Awake, oriented. PERRLA, no gross focal deficits HEENT: NGT, MM pink/dry, no jvd Cardiovascular:  s1s2 rrr, no m/r/g Lungs:  Even/non-labored, CTA Abdomen:  Mild distention, soft, hypo BS, passing gas.   Skin:  Intact, abd incision c/d/i  LABS:  CBC  Recent Labs Lab 06/26/15 0500 06/28/15 0527 06/29/15 0400  WBC 17.4* 13.2* 12.9*  HGB 8.3* 8.9* 7.6*  HCT 25.1* 27.5* 22.8*  PLT 383 360 302   Coag's  Recent Labs Lab 06/23/15 1311  INR 1.30   BMET  Recent Labs Lab 06/27/15 0545 06/28/15 0527 06/29/15 0400  NA 143 147* 144  K 3.5 3.0* 2.9*  CL 110 112* 108  CO2 23 26 27   BUN 33* 24* 20  CREATININE 1.23 1.01 0.93  GLUCOSE 240* 75 187*   Electrolytes  Recent Labs Lab 06/27/15 0545 06/27/15 0941 06/28/15 0527 06/29/15 0400  CALCIUM 6.7*  --  6.8* 6.4*  MG  --  1.9 1.9 1.7  PHOS  --  2.3* 2.3* 2.5   Sepsis Markers  Recent Labs Lab  06/23/15 1311 06/24/15 0400 06/25/15 0530 06/26/15 0500  LATICACIDVEN 1.6  --   --   --   PROCALCITON  --  1.75 1.31 0.83   ABG  Recent Labs Lab 06/23/15 1033 06/23/15 1600  PHART 7.379 7.347*  PCO2ART 33.0* 33.6*  PO2ART 91.6 321*   Liver Enzymes  Recent Labs Lab 06/24/15 0400 06/28/15 0527 06/29/15 0400  AST 61* 27 26  ALT 20 19 16*  ALKPHOS 655* 538* 417*  BILITOT 0.5 0.7 0.6  ALBUMIN 1.6* 1.9* 1.8*   Cardiac Enzymes  Recent Labs Lab 06/23/15 1311  TROPONINI <0.03   Glucose  Recent Labs Lab 06/28/15 1240 06/28/15 1616 06/28/15 2014 06/28/15 2351 06/29/15 0404 06/29/15 0742  GLUCAP 90 91 133* 109* 165* 176*    Imaging Dg Chest Port 1 View  06/29/2015  CLINICAL DATA:  Ileus.  Hypertension. EXAM: PORTABLE CHEST 1 VIEW COMPARISON:  06/27/2015. FINDINGS: Left IJ line, right PICC line, NG tube in stable position. Mediastinum hilar structures are normal. Cardiomegaly with bilateral pulmonary interstitial prominence noted. Findings consistent with congestive heart failure. Pneumonitis cannot be excluded. Persistent low lung volumes with bibasilar atelectasis. No pleural effusion or pneumothorax. IMPRESSION: 1. Lines and tubes in stable position. 2. Cardiomegaly with bilateral mild pulmonary interstitial prominence suggesting  mild congestive heart failure. 3. Persistent low lung volumes with bibasilar atelectasis. Electronically Signed   By: [name]  Register   On: 06/29/2015 07:12   Dg Abd 2 Views  06/28/2015  CLINICAL DATA:  Ileus following gastrointestinal surgery, hypertension, diabetes mellitus, history prostate cancer EXAM: ABDOMEN - 2 VIEW COMPARISON:  06/27/2015 FINDINGS: Tip of nasogastric tube projects over stomach. Skin clips project over lower abdomen. Rotated to the RIGHT. Air-filled loops of large and small bowel without significant distention. Small amount of retained contrast in distal colon. No bowel wall thickening or evidence of obstruction. Paper  clip projects over the RIGHT hip region, question external to patient. IMPRESSION: Nonspecific bowel gas pattern. Electronically Signed   By: Lavonia Dana M.D.   On: 06/28/2015 12:55   ASSESSMENT / PLAN:  Discussion:  79 year old M with PMH of metastatic prostate cancer admitted on 12/1 with abdominal pain.  CT concerning for small bowel obstruction. To OR, status post exploratory laparoscopy with bowel resection 12/2 for small bowel obstruction. Post-op course complicated by tachycardia, volume overload, respiratory distress.  Required intubation 12/13 for acute respiratory failure with wheezing and increased work of breathing. Brief PEA arrest.  Self-extubated 12/14.  Ongoing issues - ? HCAP, volume excess, malnutrition / not eating and critical illness deconditioning.     STUDIES:  LE Dopplers 12/9 >> negative for DVT CT scan chest 12/9>> negative for PE, bibasilar pleural effusions and atelectasis Echo 12/9 >>LVEF [id number], mid-systolic LV cavitary obliteration, moderate LVH, upper normal LA size, mild TR, RVSP 34 mmHg + RAP (IVC not visualized). Chest x-ray 12/13 >> left basilar opacity, concerning for pneumonia.  SIGNIFICANT EVENTS: 12/02  Exploratory laparoscopy, small bowel resection 12/13  PEA arrest, intubated 12/14  Self extubated 12/17  Family requesting transfer to [facility] for second opinion 12/18  No pressors or vent, mild HTN.  Net neg 5.3 liters  CULTURES BCx2 12/18 >> UC 12/9 >> Enterococcus Sputum  12/13 >> neg  ANTIBIOTICS Vano, start date 12/13 >> 12/16 Zosyn, start date 12/13 >> 12/19    PULMONARY OETT 12/13 >> 12/14 A: Atelectasis / At Risk Re-intubation  HCAP - questionable, NOS, PCT normal. No fever. Former Smoker - 50+ years ago, no hx of COPD   No PE Pulmonary Edema  P:   Pulmonary hygiene: IS, flutter, mobilize Continue net negative balance as tolerated  Ambulate High aspiration risk / re-intubation Intermittent CXR  CARDIOVASCULAR CVL A:  Sinus  Tachycardia Diastolic CHF Hypertension P:  Lasix 20 mg IV QD No cardizem - ileus Labetalol 20 mg IV Q6 Cardene gtt as needed to maintain SBP <160 ICU monitoring  Hydralazine 10 mg Q4 Assess ECHO with recent arrest   RENAL A:   Hypernatremia (TPN) Hypokalemia Hypophosphatemia  P:   Monitor BUN/creatinine, urine output D5W @ 35 ml/hr Replace electrolytes as indicated, KCL 12/19. Goal K+ > 4 with ileus Repeat BMP 1800  Trend BMP / lytes  GASTROINTESTINAL A:   Small bowel obstruction with hernia Status post exploratory laparoscopy, small bowel resection Postop ileus Sever Protein Calorie Malnutrition.  P:   Continue NGT LIS, mgmt per CCS TPN per pharmacy  Assess LFT's 12/21 am (ordered) Pepcid for SUP Ice chips PRN  HEMATOLOGIC A:  Anemia - stable, secondary to critical illness Resolving leukocytosis Hx Prostate Cancer - ONC eval, Rx on hold currently P:  Monitor CBC DVT proph with hep sq  INFECTIOUS A:  HCAP Enterococcus UTI. P:   ABX / Cultures as above Monitor fever curve /  WBC  ENDOCRINE A:  Diabetes mellitus - glu controlled, occ hypoglycemia P:   Continue Lantus but reduce SSI, resistant scale  NEUROLOGIC A:   Pain - post op  P:   PRN fentanyl  PT as tolerated   FAMILY  - Updates:  Daughter & son-in-law updated at bedside 12/19.    - Inter-disciplinary family meet or Palliative Care meeting due by: 12/20.    Noe Gens, NP-C [facility] Pgr: [phone] or if no answer [phone] 06/29/2015, 9:11 AM

## 2015-06-29 NOTE — Progress Notes (Signed)
IP PROGRESS NOTE  Subjective:   Events noted since patient admission up to this point. He appears lethargic but responsive and unable to answer and follow simple commands. Appear slightly restless but not in any distress.  Objective:  Vital signs in last 24 hours: Temp:  [97.8 F (36.6 C)-102.1 F (38.9 C)] 97.8 F (36.6 C) (12/19 0800) Pulse Rate:  [101-131] 116 (12/19 0800) Resp:  [17-36] 21 (12/19 0800) BP: (132-194)/(48-105) 157/68 mmHg (12/19 0800) SpO2:  [89 %-100 %] 92 % (12/19 0800) Weight:  [197 lb 1.5 oz (89.4 kg)] 197 lb 1.5 oz (89.4 kg) (12/19 0500) Weight change: -8 lb 6 oz (-3.8 kg) Last BM Date: 06/27/15  Intake/Output from previous day: 12/18 0701 - 12/19 0700 In: 2621.4 [I.V.:568.8; NG/GT:60; IV Piggyback:500; TPN:1292.7] Out: 5240 [Urine:4940; Emesis/NG output:300]  Mouth: mucous membranes moist, pharynx normal without lesions  NG tube in place. Resp: clear to auscultation bilaterally Cardio: regular rate and rhythm, S1, S2 normal, no murmur, click, rub or gallop GI: soft, non-tender; bowel sounds normal; no masses,  no organomegaly Extremities: extremities normal, atraumatic, no cyanosis or edema   Lab Results:  Recent Labs  06/28/15 0527 06/29/15 0400  WBC 13.2* 12.9*  HGB 8.9* 7.6*  HCT 27.5* 22.8*  PLT 360 302    BMET  Recent Labs  06/28/15 0527 06/29/15 0400  NA 147* 144  K 3.0* 2.9*  CL 112* 108  CO2 26 27  GLUCOSE 75 187*  BUN 24* 20  CREATININE 1.01 0.93  CALCIUM 6.8* 6.4*    Studies/Results: Dg Chest Port 1 View  06/29/2015  CLINICAL DATA:  Ileus.  Hypertension. EXAM: PORTABLE CHEST 1 VIEW COMPARISON:  06/27/2015. FINDINGS: Left IJ line, right PICC line, NG tube in stable position. Mediastinum hilar structures are normal. Cardiomegaly with bilateral pulmonary interstitial prominence noted. Findings consistent with congestive heart failure. Pneumonitis cannot be excluded. Persistent low lung volumes with bibasilar atelectasis.  No pleural effusion or pneumothorax. IMPRESSION: 1. Lines and tubes in stable position. 2. Cardiomegaly with bilateral mild pulmonary interstitial prominence suggesting mild congestive heart failure. 3. Persistent low lung volumes with bibasilar atelectasis. Electronically Signed   By: [name]  Register   On: 06/29/2015 07:12   Dg Chest Port 1 View  06/27/2015  CLINICAL DATA:  Shortness of breath, abdominal distension, nasogastric tube placement, hypertension, type II diabetes mellitus, prostate cancer, former smoker EXAM: PORTABLE CHEST 1 VIEW COMPARISON:  Portable exam 1023 hours compared to 06/25/2015 FINDINGS: Nasogastric tube extends into stomach. RIGHT arm PICC line tip projects over SVC. LEFT jugular central venous catheter tip projects over confluence of LEFT brachiocephalic vein and SVC. Rotated to the RIGHT. Enlargement of cardiac silhouette. Atherosclerotic calcification aorta. Bibasilar atelectasis. Improved perihilar infiltrates. No pleural effusion or pneumothorax. Gaseous distention of bowel in LEFT upper quadrant. IMPRESSION: Bibasilar atelectasis. Enlargement of cardiac silhouette. Improved pulmonary infiltrates. Electronically Signed   By: Lavonia Dana M.D.   On: 06/27/2015 11:16   Dg Abd 2 Views  06/28/2015  CLINICAL DATA:  Ileus following gastrointestinal surgery, hypertension, diabetes mellitus, history prostate cancer EXAM: ABDOMEN - 2 VIEW COMPARISON:  06/27/2015 FINDINGS: Tip of nasogastric tube projects over stomach. Skin clips project over lower abdomen. Rotated to the RIGHT. Air-filled loops of large and small bowel without significant distention. Small amount of retained contrast in distal colon. No bowel wall thickening or evidence of obstruction. Paper clip projects over the RIGHT hip region, question external to patient. IMPRESSION: Nonspecific bowel gas pattern. Electronically Signed   By:  Lavonia Dana M.D.   On: 06/28/2015 12:55   Dg Abd Portable 1v  06/27/2015  CLINICAL  DATA:  Abdominal distention today.  NG tube placement. EXAM: PORTABLE ABDOMEN - 1 VIEW COMPARISON:  06/19/2015 and prior CTs. 06/12/2015 and prior radiographs. FINDINGS: An NG tube is identified with tip overlying the mid stomach. Mild gaseous distention of colon and small bowel again noted. Surgical staples overlying the abdomen and pelvis are again noted. No other significant changes noted. IMPRESSION: NG tube with tip overlying the mid stomach. Continued mild gaseous distension of small bowel and colon-question ileus. Electronically Signed   By: Margarette Canada M.D.   On: 06/27/2015 10:45    Medications: I have reviewed the patient's current medications.  Assessment/Plan:  79 year old gentleman with the following issues:  1. Strangulated hernia and status post exploratory laparotomy done on 06/12/2015. He had a prolonged postoperative course and appears to be recovering slowly. NG suction in place and likely will be removed the near future and staples to be removed as well.  2. Respiratory failure: Likely related to pneumonia and sepsis. Currently on intravenous antibiotics.  3. Advanced prostate cancer with metastatic disease to the bone: He had been on Zytiga but therapy has been on hold because his acute illness. I have updated the patient and his daughter about the current and future plan of his cancer treatment.  At this time, we have no choice but to hold all cancer treatment until he is recovered. Certainly, he is not a candidate for systemic chemotherapy at this time and my not be in the future either. I would also continue to hold Zytiga in attempt to limit drug interactions as well as delaying his potential recovery. He had been failing Zytiga in any case and I would not be worth restarting it at this time.  I also made it very clear that these treatments are not curative in nature and they are palliative at best. These can be considered in the future if he makes for recovery from his  recent illness and hospitalization.  We will continue to monitor his progress and I'll be happy to address this issue with him in the future as needed.  All questions by his daughter and son-in-law were answered today.   LOS: 18 days   [id number] 06/29/2015, 9:54 AM

## 2015-06-29 NOTE — Progress Notes (Signed)
OT Cancellation Note  Patient Details Name: Jeremy Jennings MRN: [id number] DOB: [birth year]   Cancelled Treatment:    Reason Eval/Treat Not Completed: Medical issues which prohibited therapy  [name] 06/29/2015, 10:05 AM

## 2015-06-30 ENCOUNTER — Inpatient Hospital Stay (HOSPITAL_COMMUNITY): Payer: Medicare Other

## 2015-06-30 LAB — CBC
HEMATOCRIT: 23.4 % — AB (ref 39.0–52.0)
HEMOGLOBIN: 7.6 g/dL — AB (ref 13.0–17.0)
MCH: 28.9 pg (ref 26.0–34.0)
MCHC: 32.5 g/dL (ref 30.0–36.0)
MCV: 89 fL (ref 78.0–100.0)
Platelets: 222 10*3/uL (ref 150–400)
RBC: 2.63 MIL/uL — ABNORMAL LOW (ref 4.22–5.81)
RDW: 16.9 % — ABNORMAL HIGH (ref 11.5–15.5)
WBC: 8.2 10*3/uL (ref 4.0–10.5)

## 2015-06-30 LAB — BASIC METABOLIC PANEL
ANION GAP: 7 (ref 5–15)
BUN: 21 mg/dL — ABNORMAL HIGH (ref 6–20)
CO2: 26 mmol/L (ref 22–32)
Calcium: 6.5 mg/dL — ABNORMAL LOW (ref 8.9–10.3)
Chloride: 109 mmol/L (ref 101–111)
Creatinine, Ser: 0.9 mg/dL (ref 0.61–1.24)
GFR calc Af Amer: >60 mL/min (ref >60)
GLUCOSE: 183 mg/dL — AB (ref 65–99)
POTASSIUM: 3 mmol/L — AB (ref 3.5–5.1)
Sodium: 142 mmol/L (ref 135–145)

## 2015-06-30 LAB — GLUCOSE, CAPILLARY
GLUCOSE-CAPILLARY: 165 mg/dL — AB (ref 65–99)
GLUCOSE-CAPILLARY: 168 mg/dL — AB (ref 65–99)
GLUCOSE-CAPILLARY: 178 mg/dL — AB (ref 65–99)
GLUCOSE-CAPILLARY: 181 mg/dL — AB (ref 65–99)
Glucose-Capillary: 203 mg/dL — ABNORMAL HIGH (ref 65–99)
Glucose-Capillary: 163 mg/dL — ABNORMAL HIGH (ref 65–99)
Glucose-Capillary: 125 mg/dL — ABNORMAL HIGH (ref 65–99)

## 2015-06-30 LAB — MAGNESIUM: Magnesium: 2 mg/dL (ref 1.7–2.4)

## 2015-06-30 LAB — PHOSPHORUS: PHOSPHORUS: 2.2 mg/dL — AB (ref 2.5–4.6)

## 2015-06-30 MED ORDER — TRACE MINERALS CR-CU-MN-SE-ZN 10-1000-500-60 MCG/ML IV SOLN
INTRAVENOUS | Status: DC
  Administered 2015-06-30: 18:00:00 via INTRAVENOUS
  Filled 2015-06-30: qty 1992

## 2015-06-30 MED ORDER — DEXTROSE 5 % IV SOLN
30.0000 mmol | Freq: Once | INTRAVENOUS | Status: AC
  Administered 2015-06-30: 30 mmol via INTRAVENOUS
  Filled 2015-06-30: qty 10

## 2015-06-30 MED ORDER — FAT EMULSION 20 % IV EMUL
240.0000 mL | INTRAVENOUS | Status: DC
  Administered 2015-06-30: 240 mL via INTRAVENOUS
  Filled 2015-06-30: qty 250

## 2015-06-30 MED ORDER — POTASSIUM CHLORIDE 10 MEQ/50ML IV SOLN
10.0000 meq | INTRAVENOUS | Status: AC
  Administered 2015-06-30 (×4): 10 meq via INTRAVENOUS
  Filled 2015-06-30 (×4): qty 50

## 2015-06-30 MED ORDER — POTASSIUM PHOSPHATES 15 MMOLE/5ML IV SOLN: 40.0000 meq | Freq: Once | INTRAVENOUS | Status: DC

## 2015-06-30 NOTE — Progress Notes (Signed)
[place] Progress Note Patient Name: Jeremy Jennings DOB: 1936/01/31 MRN: [id number]   Date of Service  06/30/2015  HPI/Events of Note  K+ = 3.0, PO4--- = 2.2 and Creatinine = 0.90.  eICU Interventions  Will replete K+ and PO4---.     Intervention Category Intermediate Interventions: Electrolyte abnormality - evaluation and management  Pheonix Wisby Eugene 06/30/2015, 6:22 AM

## 2015-06-30 NOTE — Progress Notes (Signed)
18 Days Post-Op  Subjective: No complaints. Having bm's  Objective: Vital signs in last 24 hours: Temp:  [97.5 F (36.4 C)-100.1 F (37.8 C)] 97.5 F (36.4 C) (12/20 0747) Pulse Rate:  [94-114] 102 (12/20 0500) Resp:  [18-25] 19 (12/20 0500) BP: (115-180)/(23-68) 151/60 mmHg (12/20 0500) SpO2:  [95 %-100 %] 95 % (12/20 0500) Weight:  [90.2 kg (198 lb 13.7 oz)] 90.2 kg (198 lb 13.7 oz) (12/20 0300) Last BM Date: 06/29/15  Intake/Output from previous day: 12/19 0701 - 12/20 0700 In: 3227.4 [I.V.:767.5; NG/GT:30; IV Piggyback:400; TPN:1809.9] Out: 2085 [Urine:2085] Intake/Output this shift: Total I/O In: 289 [I.V.:10; Other:10; IV Piggyback:166; TPN:103] Out: 150 [Urine:150]  Resp: clear to auscultation bilaterally Cardio: regular rate and rhythm GI: soft, nontender. good bs  Lab Results:   Recent Labs  06/29/15 0400 06/30/15 0445  WBC 12.9* 8.2  HGB 7.6* 7.6*  HCT 22.8* 23.4*  PLT 302 222   BMET  Recent Labs  06/29/15 1800 06/30/15 0445  NA 141 142  K 3.1* 3.0*  CL 106 109  CO2 26 26  GLUCOSE 161* 183*  BUN 21* 21*  CREATININE 0.93 0.90  CALCIUM 6.5* 6.5*   PT/INR No results for input(s): LABPROT, INR in the last 72 hours. ABG No results for input(s): PHART, HCO3 in the last 72 hours.  Invalid input(s): PCO2, PO2  Studies/Results: Dg Chest Port 1 View  06/30/2015  CLINICAL DATA:  Pneumonia. EXAM: PORTABLE CHEST 1 VIEW COMPARISON:  06/29/2015 . FINDINGS: NG tube, left IJ line, right PICC line in stable position . Cardiomegaly with bilateral pulmonary interstitial prominence again noted. Findings suggest congestive heart failure. Low lung volumes with basilar atelectasis. No prominent pleural effusion or pneumothorax IMPRESSION: 1. Lines and tubes in stable position. 2. Cardiomegaly with pulmonary interstitial prominence consistent with congestive heart failure. Similar findings noted on prior exam. 3. Lung volumes with bibasilar atelectasis .  Electronically Signed   By: [name]  Register   On: 06/30/2015 07:16   Dg Chest Port 1 View  06/29/2015  CLINICAL DATA:  Ileus.  Hypertension. EXAM: PORTABLE CHEST 1 VIEW COMPARISON:  06/27/2015. FINDINGS: Left IJ line, right PICC line, NG tube in stable position. Mediastinum hilar structures are normal. Cardiomegaly with bilateral pulmonary interstitial prominence noted. Findings consistent with congestive heart failure. Pneumonitis cannot be excluded. Persistent low lung volumes with bibasilar atelectasis. No pleural effusion or pneumothorax. IMPRESSION: 1. Lines and tubes in stable position. 2. Cardiomegaly with bilateral mild pulmonary interstitial prominence suggesting mild congestive heart failure. 3. Persistent low lung volumes with bibasilar atelectasis. Electronically Signed   By: [name]  Register   On: 06/29/2015 07:12   Dg Abd 2 Views  06/28/2015  CLINICAL DATA:  Ileus following gastrointestinal surgery, hypertension, diabetes mellitus, history prostate cancer EXAM: ABDOMEN - 2 VIEW COMPARISON:  06/27/2015 FINDINGS: Tip of nasogastric tube projects over stomach. Skin clips project over lower abdomen. Rotated to the RIGHT. Air-filled loops of large and small bowel without significant distention. Small amount of retained contrast in distal colon. No bowel wall thickening or evidence of obstruction. Paper clip projects over the RIGHT hip region, question external to patient. IMPRESSION: Nonspecific bowel gas pattern. Electronically Signed   By: Lavonia Dana M.D.   On: 06/28/2015 12:55    Anti-infectives: Anti-infectives    Start     Dose/Rate Route Frequency Ordered Stop   06/23/15 1700  vancomycin (VANCOCIN) IVPB 1000 mg/200 mL premix  Status:  Discontinued     1,000 mg 200 mL/hr over  60 Minutes Intravenous Every 12 hours 06/23/15 1414 06/26/15 1000   06/23/15 1430  piperacillin-tazobactam (ZOSYN) IVPB 3.375 g     3.375 g 12.5 mL/hr over 240 Minutes Intravenous Every 8 hours 06/23/15 1416  06/30/15 0227   06/23/15 1415  piperacillin-tazo (ZOSYN) NICU IV syringe 200 mg/mL  Status:  Discontinued     75 mg/kg  105.4 kg 79 mL/hr over 30 Minutes Intravenous Every 8 hours 06/23/15 1401 06/23/15 1416   06/23/15 1000  levofloxacin (LEVAQUIN) tablet 750 mg  Status:  Discontinued     750 mg Oral Daily 06/23/15 0750 06/23/15 1401   06/22/15 1600  vancomycin (VANCOCIN) IVPB 1000 mg/200 mL premix  Status:  Discontinued     1,000 mg 200 mL/hr over 60 Minutes Intravenous Every 12 hours 06/22/15 1346 06/23/15 0740   06/20/15 0200  vancomycin (VANCOCIN) 1,250 mg in sodium chloride 0.9 % 250 mL IVPB  Status:  Discontinued     1,250 mg 166.7 mL/hr over 90 Minutes Intravenous Every 12 hours 06/19/15 1314 06/22/15 1346   06/19/15 1400  vancomycin (VANCOCIN) 2,000 mg in sodium chloride 0.9 % 500 mL IVPB     2,000 mg 250 mL/hr over 120 Minutes Intravenous  Once 06/19/15 1313 06/19/15 1737   06/19/15 1315  piperacillin-tazobactam (ZOSYN) IVPB 3.375 g  Status:  Discontinued     3.375 g 12.5 mL/hr over 240 Minutes Intravenous Every 8 hours 06/19/15 1305 06/23/15 0740   06/19/15 1100  cefTRIAXone (ROCEPHIN) 2 g in dextrose 5 % 50 mL IVPB  Status:  Discontinued     2 g 100 mL/hr over 30 Minutes Intravenous Every 24 hours 06/19/15 1020 06/19/15 1246   06/12/15 0930  cefoTEtan (CEFOTAN) 2 g in dextrose 5 % 50 mL IVPB     2 g 100 mL/hr over 30 Minutes Intravenous On call to O.R. 06/12/15 [id number] 06/12/15 1359      Assessment/Plan: s/p Procedure(s): EXPLORATORY LAPAROTOMY (N/A) SMALL BOWEL RESECTION (N/A) Advance diet. Start clears D/c ng D/c foley  LOS: 19 days    TOTH III,Keath Matera S 06/30/2015

## 2015-06-30 NOTE — Evaluation (Signed)
Clinical/Bedside Swallow Evaluation Patient Details  Name: Jeremy Jennings MRN: [id number] Date of Birth: 12-11-1935  Today's Date: 06/30/2015 Time: SLP Start Time (ACUTE ONLY): 1224 SLP Stop Time (ACUTE ONLY): 1236 SLP Time Calculation (min) (ACUTE ONLY): 12 min  Past Medical History:  Past Medical History  Diagnosis Date  . Essential hypertension   . Type 2 diabetes mellitus ([place])   . Hyperlipidemia   . Prostate cancer ([place])   . History of GI bleed   . Tachycardia   . PAC (premature atrial contraction)     Frequent   Past Surgical History:  Past Surgical History  Procedure Laterality Date  . Tonsillectomy    . Hemorroidectomy  1969  . Esophagogastroduodenoscopy N/A 05/28/2015    Procedure: ESOPHAGOGASTRODUODENOSCOPY (EGD);  Surgeon: [name], MD;  Location: [name];  Service: Endoscopy;  Laterality: N/A;  . Inguinal hernia repair Bilateral AFE 16-17  . Hernia repair  1981    DR Williamson  . Laparotomy N/A 06/12/2015    Procedure: EXPLORATORY LAPAROTOMY;  Surgeon: [name], MD;  Location: [facility];  Service: General;  Laterality: N/A;  . Bowel resection N/A 06/12/2015    Procedure: SMALL BOWEL RESECTION;  Surgeon: [name], MD;  Location: [facility];  Service: General;  Laterality: N/A;   HPI:  78 yo male adm to [facility] 06/11/15 with abdominal pain - s/p laproscopic colon resection .  PMH + for metastatic prostate cancer - hospital coarse complicated by respiratory failure/cardiac arrest - pt is s/p intubation 12/13-12/14.  He has had an NG for suctioning and has been receiving TPN.  Swallow evaluation ordered.  Pt denies h/o dysphagia.     Assessment / Plan / Recommendation Clinical Impression  Pt presents with functional oropharyngeal ability - with timely swallow and no indications of airway compromise or residuals.  CN exam unremarkable and pt denies baseline dysphagia.  Cough is strong and productive and voice is intact.  Recommend advance diet as CCS  indicates.       Aspiration Risk  No limitations    Diet Recommendation  (defer to CCS - when ready to advance - recommend regular/thin)   Liquid Administration via: Cup;Straw Medication Administration: Whole meds with liquid Supervision: Patient able to self feed Compensations: Slow rate;Small sips/bites Postural Changes: Remain upright for at least 30 minutes after po intake;Seated upright at 90 degrees    Other  Recommendations Oral Care Recommendations: Oral care BID   Follow up Recommendations  None    Frequency and Duration     n/a       Prognosis   WFL     Swallow Study   General Date of Onset: 06/30/15 HPI: 79 yo male adm to [facility] 06/11/15 with abdominal pain - s/p laproscopic colon resection .  PMH + for metastatic prostate cancer - hospital coarse complicated by respiratory failure/cardiac arrest - pt is s/p intubation 12/13-12/14.  He has had an NG for suctioning and has been receiving TPN.  Swallow evaluation ordered.  Pt denies h/o dysphagia.   Type of Study: Bedside Swallow Evaluation Diet Prior to this Study: Thin liquids Temperature Spikes Noted: No Respiratory Status: Room air History of Recent Intubation: No Behavior/Cognition: Alert;Cooperative;Pleasant mood Oral Care Completed by SLP: No Oral Cavity - Dentition: Adequate natural dentition Vision: Functional for self-feeding Self-Feeding Abilities: Able to feed self Patient Positioning: Upright in chair Baseline Vocal Quality: Normal Volitional Cough: Strong Volitional Swallow: Able to elicit    Oral/Motor/Sensory Function Overall Oral Motor/Sensory Function: Within functional  limits   Ice Chips Ice chips: Not tested   Thin Liquid Thin Liquid: Within functional limits Presentation: Cup;Straw    Nectar Thick Nectar Thick Liquid: Not tested   Honey Thick Honey Thick Liquid: Not tested   Puree Puree: Within functional limits Presentation: Self Fed;Spoon Other Comments: jello WFL   Solid Solid: Not  tested       Luanna Salk, [place] [facility] SLP [phone]

## 2015-06-30 NOTE — Progress Notes (Addendum)
PARENTERAL NUTRITION CONSULT NOTE - FOLLOW-UP  Pharmacy Consult for TPN Indication: Prolonged Ileus  No Known Allergies  Patient Measurements: Height: 6' (182.9 cm) Weight: 198 lb 13.7 oz (90.2 kg) IBW/kg (Calculated) : 77.6 Adjusted Body Weight: 82.5 kg  Vital Signs: Temp: 99.4 F (37.4 C) (12/20 0314) Temp Source: Oral (12/20 0314) BP: 151/60 mmHg (12/20 0500) Pulse Rate: 102 (12/20 0500) Intake/Output from previous day: 12/19 0701 - 12/20 0700 In: 3001.4 [I.V.:747.5; NG/GT:30; IV Piggyback:400; TPN:1623.9] Out: 2085 [Urine:2085] Intake/Output from this shift:    Labs:  Recent Labs  06/28/15 0527 06/29/15 0400 06/30/15 0445  WBC 13.2* 12.9* 8.2  HGB 8.9* 7.6* 7.6*  HCT 27.5* 22.8* 23.4*  PLT 360 302 222     Recent Labs  06/28/15 0527 06/29/15 0400 06/29/15 1800 06/30/15 0445  NA 147* 144 141 142  K 3.0* 2.9* 3.1* 3.0*  CL 112* 108 106 109  CO2 26 27 26 26  GLUCOSE 75 187* 161* 183*  BUN 24* 20 21* 21*  CREATININE 1.01 0.93 0.93 0.90  CALCIUM 6.8* 6.4* 6.5* 6.5*  MG 1.9 1.7  --  2.0  PHOS 2.3* 2.5  --  2.2*  PROT 5.4* 5.2*  --   --   ALBUMIN 1.9* 1.8*  --   --   AST 27 26  --   --   ALT 19 16*  --   --   ALKPHOS 538* 417*  --   --   BILITOT 0.7 0.6  --   --   PREALBUMIN 17.9* 12.4*  --   --   TRIG 150* 161*  --   --    Estimated Creatinine Clearance: 75.4 mL/min (by C-G formula based on Cr of 0.9).    Recent Labs  06/29/15 1942 06/30/15 0028 06/30/15 0426  GLUCAP 159* 168* 165*    Medical History: Past Medical History  Diagnosis Date  . Essential hypertension   . Type 2 diabetes mellitus (HCC)   . Hyperlipidemia   . Prostate cancer (HCC)   . History of GI bleed   . Tachycardia   . PAC (premature atrial contraction)     Frequent   Insulin Requirements: 27 units SSI/24h Lantus 16 units  Regular Insulin 20 units in TPN  Current Nutrition: NPO (unable to take Juven powder nutritional supplements currently)  IVF: D5W at 10  ml/hr  Central access: PICC placed 12/6 TPN start date: 12/6-12/14, resumed 12/17  ASSESSMENT                                                                                                          HPI: 79 y/oM with widely metastatic prostate cancer who presented 12/1 with abdominal pain nausea and vomiting. CT scan concerning for SBO. Patient underwent emergency laparotomy with small bowel resection on 12/2. Patient developed post-op ileus. CCS  consulted pharmacy to begin TPN for prolonged ileus on 12/6, and patient remained on TPN until 12/14. Asked to resume TPN 12/17 for nutrition support as patient with increased n/v, unable to take POs.  Significant events:    12/10: transferred to ICU 12/11: significant increase in SCr, CBG's remain elevated 12/13: start soft diet, orders to wean TPN to off **afternoon update**PEA arrest, pt intubated, consulted to continue TPN 12/14: self extubated, TPN weaned off 12/17: more nausea and emesis, not able to take POs. NGT placed for persistent ileus. Resume TPN 12/20: start clear liquids  Today:   Glucose -  CBGs 159-168 since start of new TPN bag last PM, (goal < 150)  Electrolytes - K+ low at 3, Phos low at 2.2, Corrected Calcium low at 8.16  Renal - SCr improved WNL  24h I/O =  +941m multiple BMs. UOP down with changing of furosemide from 239mIB BID to daily.    LFTs - AST improved to WNL, ALT WNL, Tbili WNL, Alk Phos elevated at 538, but trending down  TGs - 157 (12/7), 149 (12/12), 178 (12/14), 150 (12/18), 161 (12/19)  Prealbumin - 3.5 (12/6), 4.4 (12/7), 2.4 (12/12), 4.8 (12/14), 17.9 (12/18), 12.4 (12/19)  NUTRITIONAL GOALS                                                                                             RD recs (revised 06/25/15): Kcal: 2000-2200, Protein: 100-110g  Clinimix 5/15 at a goal rate of 83 ml/hr + 20% fat emulsion at 10 ml/hr to provide: 100g/day protein, 1894Kcal/day (meeting 100% protein needs and 95%  kcal needs).  PLAN                                                                                                                            KPhos 3048m IVPB x 1 ordered by PCCM, will also provide 70m55m+  At 1800 today:  Continue Clinimix E 5/15 to goal of 83 ml/hr.   Fat emulsions 20% at 10 ml/hr  Do not see reason to withhold lipids for first week for ICU patient since he was recently on TPN in ICU (until 12/14) then it was stopped x 2 days then resumed.   Increase to 25 units regular insulin/24 hours in TPN.   continue Famotidine 20 mg/TPN bag. D/C IVPB order.  TPN to contain standard multivitamins and trace elements.  Continue  [facility] SSI q4h. Lantus 10 units qHS per MD.   TPN lab panels on Mondays & Thursdays.  F/u daily.   DustDoreene ElandarmD, BCPS.   Pager: [phone]/2016 7:28 AM

## 2015-06-30 NOTE — Progress Notes (Signed)
PULMONARY / CRITICAL CARE MEDICINE   Name: Jeremy Jennings MRN: [id number] DOB: [birth year]    ADMISSION DATE:  06/11/2015 CONSULTATION DATE:  06/23/15  REFERRING MD :  CCS  CHIEF COMPLAINT:  Acute respiratory failure, cardiac arrest   SUBJECTIVE: Feels better today. Sitting up in chair in no distress.  VITAL SIGNS: RA   Temp:  [97.5 F (36.4 C)-100.1 F (37.8 C)] 97.5 F (36.4 C) (12/20 0747) Pulse Rate:  [94-114] 103 (12/20 0900) Resp:  [18-25] 21 (12/20 0900) BP: (115-180)/(23-70) 167/63 mmHg (12/20 0900) SpO2:  [95 %-100 %] 99 % (12/20 0900) Weight:  [198 lb 13.7 oz (90.2 kg)] 198 lb 13.7 oz (90.2 kg) (12/20 0300)   HEMODYNAMICS:      INTAKE / OUTPUT:  Intake/Output Summary (Last 24 hours) at 06/30/15 [id number] Last data filed at 06/30/15 0900  Gross per 24 hour  Intake 3092.81 ml  Output   1910 ml  Net 1182.81 ml    PHYSICAL EXAMINATION: General:  Chronically ill appearing elderly male in NAD Neuro:  Awake, oriented. PERRLA, no gross focal deficits HEENT: NGT,  moist mucous membranes, Cardiovascular:  S1-S2, no murmurs rubs gallops Lungs:  Nonlabored, clear. No wheeze or crackles Abdomen:  Mild distention, soft, positive bowel sounds  Skin:  Intact, abd incision clean.  LABS:  CBC  Recent Labs Lab 06/28/15 0527 06/29/15 0400 06/30/15 0445  WBC 13.2* 12.9* 8.2  HGB 8.9* 7.6* 7.6*  HCT 27.5* 22.8* 23.4*  PLT 360 302 222   Coag's  Recent Labs Lab 06/23/15 1311  INR 1.30   BMET  Recent Labs Lab 06/29/15 0400 06/29/15 1800 06/30/15 0445  NA 144 141 142  K 2.9* 3.1* 3.0*  CL 108 106 109  CO2 27 26 26   BUN 20 21* 21*  CREATININE 0.93 0.93 0.90  GLUCOSE 187* 161* 183*   Electrolytes  Recent Labs Lab 06/28/15 0527 06/29/15 0400 06/29/15 1800 06/30/15 0445  CALCIUM 6.8* 6.4* 6.5* 6.5*  MG 1.9 1.7  --  2.0  PHOS 2.3* 2.5  --  2.2*   Sepsis Markers  Recent Labs Lab 06/23/15 1311 06/24/15 0400 06/25/15 0530 06/26/15 0500   LATICACIDVEN 1.6  --   --   --   PROCALCITON  --  1.75 1.31 0.83   ABG  Recent Labs Lab 06/23/15 1033 06/23/15 1600  PHART 7.379 7.347*  PCO2ART 33.0* 33.6*  PO2ART 91.6 321*   Liver Enzymes  Recent Labs Lab 06/24/15 0400 06/28/15 0527 06/29/15 0400  AST 61* 27 26  ALT 20 19 16*  ALKPHOS 655* 538* 417*  BILITOT 0.5 0.7 0.6  ALBUMIN 1.6* 1.9* 1.8*   Cardiac Enzymes  Recent Labs Lab 06/23/15 1311  TROPONINI <0.03   Glucose  Recent Labs Lab 06/29/15 1126 06/29/15 1557 06/29/15 1942 06/30/15 0028 06/30/15 0426 06/30/15 0747  GLUCAP 210* 186* 159* 168* 165* 178*    Imaging Dg Chest Port 1 View  06/30/2015  CLINICAL DATA:  Pneumonia. EXAM: PORTABLE CHEST 1 VIEW COMPARISON:  06/29/2015 . FINDINGS: NG tube, left IJ line, right PICC line in stable position . Cardiomegaly with bilateral pulmonary interstitial prominence again noted. Findings suggest congestive heart failure. Low lung volumes with basilar atelectasis. No prominent pleural effusion or pneumothorax IMPRESSION: 1. Lines and tubes in stable position. 2. Cardiomegaly with pulmonary interstitial prominence consistent with congestive heart failure. Similar findings noted on prior exam. 3. Lung volumes with bibasilar atelectasis . Electronically Signed   By: [name]  Register   On: 06/30/2015  07:16   ASSESSMENT / PLAN:  Discussion:  79 year old M with PMH of metastatic prostate cancer admitted on 12/1 with abdominal pain.  CT concerning for small bowel obstruction. To OR, status post exploratory laparoscopy with bowel resection 12/2 for small bowel obstruction. Post-op course complicated by tachycardia, volume overload, respiratory distress.  Required intubation 12/13 for acute respiratory failure with wheezing and increased work of breathing. Brief PEA arrest.  Self-extubated 12/14.  Ongoing issues - ? HCAP, volume excess, malnutrition / not eating and critical illness deconditioning.     STUDIES:  LE Dopplers  12/9 >> negative for DVT CT scan chest 12/9>> negative for PE, bibasilar pleural effusions and atelectasis Echo 12/9 >>LVEF [id number], mid-systolic LV cavitary obliteration, moderate LVH, upper normal LA size, mild TR, RVSP 34 mmHg + RAP (IVC not visualized). Chest x-ray 12/13 >> left basilar opacity, concerning for pneumonia.  SIGNIFICANT EVENTS: 12/02  Exploratory laparoscopy, small bowel resection 12/13  PEA arrest, intubated 12/14  Self extubated 12/17  Family requesting transfer to [facility] for second opinion 12/18  No pressors or vent, mild HTN.  Net neg 5.3 liters  CULTURES BCx2 12/18 >> UC 12/9 >> Enterococcus Sputum  12/13 >> neg  ANTIBIOTICS Vano, start date 12/13 >> 12/16 Zosyn, start date 12/13 >> 12/19    PULMONARY OETT 12/13 >> 12/14 A: Atelectasis / At Risk Re-intubation  HCAP - questionable, NOS, PCT normal. No fever. Former Smoker - 50+ years ago, no hx of COPD   No PE Pulmonary Edema  P:   Pulmonary hygiene: IS, flutter, mobilize Continue net negative balance as tolerated  Ambulate High aspiration risk / re-intubation Intermittent CXR  CARDIOVASCULAR CVL A:  Sinus Tachycardia Diastolic CHF Hypertension P:  Lasix 20 mg IV QD No cardizem - ileus Labetalol 20 mg IV Q6 Cardene gtt as needed to maintain SBP <160 ICU monitoring  Hydralazine 10 mg Q4 Assess ECHO with recent arrest   RENAL A:   Hypernatremia (TPN) Hypokalemia Hypophosphatemia  P:   Monitor BUN/creatinine, urine output Goal K+ > 4 with ileus Repeat BMP 1800  Trend BMP / lytes  GASTROINTESTINAL A:   Small bowel obstruction with hernia Status post exploratory laparoscopy, small bowel resection Postop ileus Sever Protein Calorie Malnutrition.  P:   NG tube to be discontinued today. Starting clears. TPN per pharmacy  Assess LFT's 12/21 am (ordered) Pepcid for SUP Ice chips PRN  HEMATOLOGIC A:  Anemia - stable, secondary to critical illness Resolving leukocytosis Hx Prostate  Cancer - ONC eval, Rx on hold currently P:  Monitor CBC DVT proph with hep sq  INFECTIOUS A:  HCAP Enterococcus UTI. P:   ABX / Cultures as above Monitor fever curve / WBC  ENDOCRINE A:  Diabetes mellitus - glu controlled, occ hypoglycemia P:   Continue Lantus but reduce SSI, resistant scale  NEUROLOGIC A:   Pain - post op  P:   PRN fentanyl  PT as tolerated   FAMILY  - Updates:  Daughter & son-in-law updated at bedside 12/19.   - Inter-disciplinary family meet or Palliative Care meeting due by: 12/20.  Critical care time-35 minutes.  Marshell Garfinkel MD [facility] Pager [phone] If no answer or after 3pm call: [phone] 06/30/2015, 9:28 AM

## 2015-06-30 NOTE — Progress Notes (Signed)
Subjective: No complaints, up in chair.  Objective: Vital signs in last 24 hours: Temp:  [97.5 F (36.4 C)-100.1 F (37.8 C)] 97.5 F (36.4 C) (12/20 0747) Pulse Rate:  [94-114] 102 (12/20 0500) Resp:  [18-25] 19 (12/20 0500) BP: (115-180)/(23-68) 151/60 mmHg (12/20 0500) SpO2:  [95 %-100 %] 95 % (12/20 0500) Weight:  [198 lb 13.7 oz (90.2 kg)] 198 lb 13.7 oz (90.2 kg) (12/20 0300) Weight change: 1 lb 12.2 oz (0.8 kg) Last BM Date: 06/29/15 Intake/Output from previous day: +1098 12/19 0701 - 12/20 0700 In: 3207.4 [I.V.:767.5; NG/GT:30; IV Piggyback:400; TPN:1809.9] Out: 2085 [Urine:2085] Intake/Output this shift: Total I/O In: 83 [IV Piggyback:83] Out: -   PE: General:Pleasant affect, NAD Skin:Warm and dry, brisk capillary refill HEENT:normocephalic, sclera clear, mucus membranes moist Heart:S1S2 RRR without murmur, gallup, rub or click Lungs: without rales, + rhonchi, no wheezes [id number], non tender, + BS, do not palpate liver spleen or masses Ext:1-2+ lower ext edema, 2+ pedal pulses, 2+ radial pulses Neuro:alert and oriented X 3, MAE, follows commands, + facial symmetry Tele: SR with rates to 105 at times.    Lab Results:  Recent Labs  06/29/15 0400 06/30/15 0445  WBC 12.9* 8.2  HGB 7.6* 7.6*  HCT 22.8* 23.4*  PLT 302 222   BMET  Recent Labs  06/29/15 1800 06/30/15 0445  NA 141 142  K 3.1* 3.0*  CL 106 109  CO2 26 26  GLUCOSE 161* 183*  BUN 21* 21*  CREATININE 0.93 0.90  CALCIUM 6.5* 6.5*   No results for input(s): TROPONINI in the last 72 hours.  Invalid input(s): CK, MB  Lab Results  Component Value Date   CHOL 167 12/10/2014   HDL 37.50* 12/10/2014   LDLCALC 108* 12/10/2014   TRIG 161* 06/29/2015   CHOLHDL 4 12/10/2014   Lab Results  Component Value Date   HGBA1C 6.3* 05/28/2015     Lab Results  Component Value Date   TSH 2.837 05/28/2015    Hepatic Function Panel  Recent Labs  06/29/15 0400  PROT 5.2*    ALBUMIN 1.8*  AST 26  ALT 16*  ALKPHOS 417*  BILITOT 0.6   No results for input(s): CHOL in the last 72 hours. No results for input(s): PROTIME in the last 72 hours.     Studies/Results: Dg Chest Port 1 View  06/30/2015  CLINICAL DATA:  Pneumonia. EXAM: PORTABLE CHEST 1 VIEW COMPARISON:  06/29/2015 . FINDINGS: NG tube, left IJ line, right PICC line in stable position . Cardiomegaly with bilateral pulmonary interstitial prominence again noted. Findings suggest congestive heart failure. Low lung volumes with basilar atelectasis. No prominent pleural effusion or pneumothorax IMPRESSION: 1. Lines and tubes in stable position. 2. Cardiomegaly with pulmonary interstitial prominence consistent with congestive heart failure. Similar findings noted on prior exam. 3. Lung volumes with bibasilar atelectasis . Electronically Signed   By: [name]  Register   On: 06/30/2015 07:16   Dg Chest Port 1 View  06/29/2015  CLINICAL DATA:  Ileus.  Hypertension. EXAM: PORTABLE CHEST 1 VIEW COMPARISON:  06/27/2015. FINDINGS: Left IJ line, right PICC line, NG tube in stable position. Mediastinum hilar structures are normal. Cardiomegaly with bilateral pulmonary interstitial prominence noted. Findings consistent with congestive heart failure. Pneumonitis cannot be excluded. Persistent low lung volumes with bibasilar atelectasis. No pleural effusion or pneumothorax. IMPRESSION: 1. Lines and tubes in stable position. 2. Cardiomegaly with bilateral mild pulmonary interstitial prominence suggesting mild congestive heart failure.  3. Persistent low lung volumes with bibasilar atelectasis. Electronically Signed   By: [name]  Register   On: 06/29/2015 07:12   Dg Abd 2 Views  06/28/2015  CLINICAL DATA:  Ileus following gastrointestinal surgery, hypertension, diabetes mellitus, history prostate cancer EXAM: ABDOMEN - 2 VIEW COMPARISON:  06/27/2015 FINDINGS: Tip of nasogastric tube projects over stomach. Skin clips project over  lower abdomen. Rotated to the RIGHT. Air-filled loops of large and small bowel without significant distention. Small amount of retained contrast in distal colon. No bowel wall thickening or evidence of obstruction. Paper clip projects over the RIGHT hip region, question external to patient. IMPRESSION: Nonspecific bowel gas pattern. Electronically Signed   By: Lavonia Dana M.D.   On: 06/28/2015 12:55   ECHO: Study Conclusions  - Left ventricle: Hyperdynamic LV function but less cavity obliteration than previous and no LVOT gradient The cavity size was normal. Wall thickness was increased in a pattern of moderate LVH. Systolic function was vigorous. The estimated ejection fraction was in the range of 65% to 70%. Wall motion was normal; there were no regional wall motion abnormalities. Doppler parameters are consistent with elevated ventricular end-diastolic filling pressure. - Left atrium: The atrium was mildly dilated. - Atrial septum: No defect or patent foramen ovale was identified.  Medications: I have reviewed the patient's current medications. Scheduled Meds: . antiseptic oral rinse  7 mL Mouth Rinse q12n4p  . chlorhexidine  15 mL Mouth Rinse BID  . furosemide  20 mg Intravenous Daily  . heparin subcutaneous  5,000 Units Subcutaneous 3 times per day  . hydrALAZINE  10 mg Intravenous 6 times per day  . insulin aspart  0-20 Units Subcutaneous Q4H  . insulin glargine  10 Units Subcutaneous QHS  . labetalol  20 mg Intravenous Q6H  . lip balm  1 application Topical BID  . ondansetron (ZOFRAN) IV  4 mg Intravenous 6 times per day  . potassium phosphate IVPB (mmol)  30 mmol Intravenous Once  . prednisoLONE acetate  1 drop Both Eyes BID   Continuous Infusions: . Marland KitchenTPN (CLINIMIX-E) Adult 83 mL/hr at 06/29/15 2000   And  . fat emulsion 240 mL (06/29/15 2000)  . dextrose 10 mL/hr at 06/29/15 2105  . niCARDipine Stopped (06/29/15 1000)   PRN Meds:.bisacodyl, fentaNYL  (SUBLIMAZE) injection, magic mouthwash, methocarbamol (ROBAXIN)  IV, sodium chloride  Assessment/Plan: 11M with stage 4 prostate CA, HTN, DM, recent GIB, chronic-appearing sinus tach (with PACs) admitted with SBO. S/p ex-lap/LOA [id number], initially complicated by post-op ileus, fever, enterococcal UTI, sinus tachycardia. LE duplex/CTA 12/9 neg for DVT/PE. 2D Echo 06/19/15: mod LVH, mid LV cavity systolic obliteration with increased mid-cavitary gradient, EF 65%, PASP 34. Developed ventilator dependent respiratory failure on 06/23/15 a/w bradycardic to PEA arrest - time to ROSC 6 minutes, received 2 amps of epinephrine - felt to be primary respiratory event. Has also had acute encephalopathy with periodic confusion this admission. Cardiology following for acute on chronic diastolic CHF and sinus tach.  1. Acute on chronic diastolic CHF with hypertensive heart disease - inpatient weights are extremely variable, now at 198 which is the lowest weight for quite some time. CXR still with mild CHF-no acute changes today. Suspect component of his edema is due to severe hypoalbuminemia. Will review plan for diuretic with MD (currently on Lasix 20mg  IV daily with stable renal function) - no + 1098 last 24 hours. -echo with hyperdynamic LV function but less cavity obliteration- EF 65-70%  + LVH   2.  Chronic sinus tachycardia with PACs - on higher-dose diltiazem at home (360mg  daily) as well as carvedilol 12.5mg  BID. Now on IV labetalol (nicardipine stopped). HR controlled.  3. Essential HTN - on meds as above along with IV hydralazine which was titrated by primary team today to every 4 hours and IV labetalol.  4. PEA arrest 06/23/15 - felt to be primary resp event. Troponin after event was negative. Maintaining SR  5. Chronic anemia - baseline Hgb appears 7-9.  6. Hypokalemia/hypocalcemia - lytes being managed by primary team. Continues with hypokalemia  7. Nutrition per primary team-TPN   LOS: 19 days    Time spent with pt. :15 minutes. [facility] R  Nurse Practitioner Certified Pager [phone] or after 5pm and on weekends call [phone] 06/30/2015, 8:14 AM  Agree with above assessment.  The patient looks much better today.  He is not lethargic.  He took a walk with his family in the hall earlier today and plans to take another one this afternoon.  His rhythm is sinus tachycardia at 10 2 bpm.  He denies any chest discomfort or shortness of breath.  He has significant pretibial and pedal edema due in large part to his low serum albumin. Examination reveals decreased breath sounds at the bases and he does have bibasilar atelectasis on chest x-ray today.

## 2015-07-01 ENCOUNTER — Ambulatory Visit (HOSPITAL_COMMUNITY): Payer: Medicare Other

## 2015-07-01 DIAGNOSIS — M7989 Other specified soft tissue disorders: Secondary | ICD-10-CM

## 2015-07-01 LAB — CBC
HEMATOCRIT: 22.3 % — AB (ref 39.0–52.0)
HEMOGLOBIN: 7.1 g/dL — AB (ref 13.0–17.0)
MCH: 28.9 pg (ref 26.0–34.0)
MCHC: 31.8 g/dL (ref 30.0–36.0)
MCV: 90.7 fL (ref 78.0–100.0)
Platelets: 226 10*3/uL (ref 150–400)
RBC: 2.46 MIL/uL — AB (ref 4.22–5.81)
RDW: 17.4 % — ABNORMAL HIGH (ref 11.5–15.5)
WBC: 6.7 10*3/uL (ref 4.0–10.5)

## 2015-07-01 LAB — HEPATIC FUNCTION PANEL
ALBUMIN: 1.7 g/dL — AB (ref 3.5–5.0)
ALK PHOS: 319 U/L — AB (ref 38–126)
ALT: 28 U/L (ref 17–63)
AST: 38 U/L (ref 15–41)
BILIRUBIN TOTAL: 0.5 mg/dL (ref 0.3–1.2)
Bilirubin, Direct: <0.1 mg/dL — ABNORMAL LOW (ref 0.1–0.5)
TOTAL PROTEIN: 5 g/dL — AB (ref 6.5–8.1)

## 2015-07-01 LAB — BASIC METABOLIC PANEL
ANION GAP: 8 (ref 5–15)
BUN: 20 mg/dL (ref 6–20)
CHLORIDE: 108 mmol/L (ref 101–111)
CO2: 25 mmol/L (ref 22–32)
CREATININE: 0.94 mg/dL (ref 0.61–1.24)
Calcium: 6.4 mg/dL — CL (ref 8.9–10.3)
GFR calc non Af Amer: >60 mL/min (ref >60)
Glucose, Bld: 99 mg/dL (ref 65–99)
POTASSIUM: 3.3 mmol/L — AB (ref 3.5–5.1)
Sodium: 141 mmol/L (ref 135–145)

## 2015-07-01 LAB — GLUCOSE, CAPILLARY
GLUCOSE-CAPILLARY: 114 mg/dL — AB (ref 65–99)
GLUCOSE-CAPILLARY: 133 mg/dL — AB (ref 65–99)
GLUCOSE-CAPILLARY: 94 mg/dL (ref 65–99)
GLUCOSE-CAPILLARY: 108 mg/dL — AB (ref 65–99)
Glucose-Capillary: 134 mg/dL — ABNORMAL HIGH (ref 65–99)

## 2015-07-01 LAB — PHOSPHORUS: PHOSPHORUS: 2 mg/dL — AB (ref 2.5–4.6)

## 2015-07-01 LAB — MAGNESIUM: MAGNESIUM: 2 mg/dL (ref 1.7–2.4)

## 2015-07-01 MED ORDER — SODIUM PHOSPHATE 3 MMOLE/ML IV SOLN
20.0000 mmol | Freq: Once | INTRAVENOUS | Status: AC
  Administered 2015-07-01: 20 mmol via INTRAVENOUS
  Filled 2015-07-01: qty 6.67

## 2015-07-01 MED ORDER — FAMOTIDINE IN NACL 20-0.9 MG/50ML-% IV SOLN
20.0000 mg | INTRAVENOUS | Status: DC
  Administered 2015-07-02 – 2015-07-03 (×2): 20 mg via INTRAVENOUS
  Filled 2015-07-01 (×2): qty 50

## 2015-07-01 MED ORDER — CALCIUM GLUCONATE 10 % IV SOLN
2.0000 g | Freq: Once | INTRAVENOUS | Status: AC
  Administered 2015-07-01: 2 g via INTRAVENOUS
  Filled 2015-07-01: qty 20

## 2015-07-01 MED ORDER — POTASSIUM CHLORIDE 10 MEQ/50ML IV SOLN
10.0000 meq | INTRAVENOUS | Status: AC
  Administered 2015-07-01 (×4): 10 meq via INTRAVENOUS
  Filled 2015-07-01 (×4): qty 50

## 2015-07-01 NOTE — Progress Notes (Signed)
PULMONARY / CRITICAL CARE MEDICINE   Name: Jeremy Jennings MRN: [id number] DOB: [birth year]    ADMISSION DATE:  06/11/2015 CONSULTATION DATE:  06/23/15  REFERRING MD :  CCS  CHIEF COMPLAINT:  Acute respiratory failure, cardiac arrest   SUBJECTIVE: Sitting up in chair in no distress. Awake and oriented. Walked in the hallways yesterday without any issue.   VITAL SIGNS: RA   Temp:  [97.6 F (36.4 C)-100.1 F (37.8 C)] 97.9 F (36.6 C) (12/21 0800) Pulse Rate:  [95-140] 102 (12/21 0600) Resp:  [16-28] 22 (12/21 0600) BP: (121-178)/(35-103) 135/60 mmHg (12/21 0900) SpO2:  [96 %-100 %] 100 % (12/21 0600) Weight:  [196 lb 6.9 oz (89.1 kg)] 196 lb 6.9 oz (89.1 kg) (12/21 0327)    INTAKE / OUTPUT:  Intake/Output Summary (Last 24 hours) at 07/01/15 0942 Last data filed at 07/01/15 [id number]  Gross per 24 hour  Intake 3481.67 ml  Output   1600 ml  Net 1881.67 ml    PHYSICAL EXAMINATION: General:  Chronically ill appearing elderly male in NAD Neuro:  Awake, oriented. PERRLA, no gross focal deficits HEENT: NGT,  moist mucous membranes, Cardiovascular:  S1-S2, no murmurs rubs gallops Lungs:  Nonlabored, clear. No wheeze or crackles Abdomen:  Mild distention, soft, positive bowel sounds  Skin:  Intact, abd incision clean.  LABS:  CBC  Recent Labs Lab 06/29/15 0400 06/30/15 0445 07/01/15 0345  WBC 12.9* 8.2 6.7  HGB 7.6* 7.6* 7.1*  HCT 22.8* 23.4* 22.3*  PLT 302 222 226   Coag's No results for input(s): APTT, INR in the last 168 hours. BMET  Recent Labs Lab 06/29/15 1800 06/30/15 0445 07/01/15 0345  NA 141 142 141  K 3.1* 3.0* 3.3*  CL 106 109 108  CO2 26 26 25   BUN 21* 21* 20  CREATININE 0.93 0.90 0.94  GLUCOSE 161* 183* 99   Electrolytes  Recent Labs Lab 06/29/15 0400 06/29/15 1800 06/30/15 0445 07/01/15 0345  CALCIUM 6.4* 6.5* 6.5* 6.4*  MG 1.7  --  2.0 2.0  PHOS 2.5  --  2.2* 2.0*   Sepsis Markers  Recent Labs Lab 06/25/15 0530 06/26/15 0500   PROCALCITON 1.31 0.83   ABG No results for input(s): PHART, PCO2ART, PO2ART in the last 168 hours. Liver Enzymes  Recent Labs Lab 06/28/15 0527 06/29/15 0400 07/01/15 0345  AST 27 26 38  ALT 19 16* 28  ALKPHOS 538* 417* 319*  BILITOT 0.7 0.6 0.5  ALBUMIN 1.9* 1.8* 1.7*   Cardiac Enzymes No results for input(s): TROPONINI, PROBNP in the last 168 hours. Glucose  Recent Labs Lab 06/30/15 0747 06/30/15 1052 06/30/15 1608 06/30/15 2019 06/30/15 2306 07/01/15 0321  GLUCAP 178* 203* 181* 163* 125* 114*    Imaging No results found. ASSESSMENT / PLAN:  Discussion:  79 year old M with PMH of metastatic prostate cancer admitted on 12/1 with abdominal pain.  CT concerning for small bowel obstruction. To OR, status post exploratory laparoscopy with bowel resection 12/2 for small bowel obstruction. Post-op course complicated by tachycardia, volume overload, respiratory distress.  Required intubation 12/13 for acute respiratory failure with wheezing and increased work of breathing. Brief PEA arrest.  Self-extubated 12/14.  Ongoing issues - ? HCAP, volume excess, malnutrition / not eating and critical illness deconditioning.    STUDIES:  LE Dopplers 12/9 >> negative for DVT CT scan chest 12/9>> negative for PE, bibasilar pleural effusions and atelectasis Echo 12/9 >>LVEF [id number], mid-systolic LV cavitary obliteration, moderate LVH, upper normal LA size, mild  TR, RVSP 34 mmHg + RAP (IVC not visualized). Chest x-ray 12/13 >> left basilar opacity, concerning for pneumonia.  SIGNIFICANT EVENTS: 12/02  Exploratory laparoscopy, small bowel resection 12/13  PEA arrest, intubated 12/14  Self extubated 12/17  Family requesting transfer to [facility] for second opinion 12/18  No pressors or vent, mild HTN.  Net neg 5.3 liters  CULTURES BCx2 12/18 >> UC 12/9 >> Enterococcus Sputum  12/13 >> neg  ANTIBIOTICS Vano, start date 12/13 >> 12/16 Zosyn, start date 12/13 >> 12/19   PULMONARY OETT  12/13 >> 12/14 A: HCAP - questionable, NOS, PCT normal. No fever. Former Smoker - 50+ years ago, no hx of COPD   No PE Pulmonary Edema  P:   Pulmonary hygiene: IS, flutter, mobilize Ambulate Intermittent CXR  CARDIOVASCULAR CVL A:  Sinus Tachycardia Diastolic CHF Hypertension P:  Lasix 20 mg IV QD No cardizem - ileus Hydralazine, labetolol for BP  RENAL A:   Hypernatremia (TPN) Hypokalemia Hypophosphatemia  P:   Monitor BUN/creatinine, urine output Goal K+ > 4 with ileus Repeat BMP 1800  Trend BMP / lytes  GASTROINTESTINAL A:   Small bowel obstruction with hernia Status post exploratory laparoscopy, small bowel resection Postop ileus Sever Protein Calorie Malnutrition.  P:   Advancing diet. Will be off TPN today  HEMATOLOGIC A:  Anemia - stable, secondary to critical illness Resolving leukocytosis Hx Prostate Cancer - ONC eval, Rx on hold currently P:  Monitor CBC DVT proph with Hep SQ  INFECTIOUS A:  HCAP Enterococcus UTI. P:   ABX / Cultures as above Monitor fever curve / WBC  ENDOCRINE A:  Diabetes mellitus - glu controlled, occ hypoglycemia P:   Continue Lantus. SSI, resistant scale  NEUROLOGIC A:   Pain - post op  P:   PRN fentanyl  PT as tolerated   FAMILY  - Updates:  Daughter updated at bedside 12/21.   - Inter-disciplinary family meet or Palliative Care meeting due by: 12/20.  Marshell Garfinkel MD [facility] Pager [phone] If no answer or after 3pm call: [phone] 07/01/2015, 9:42 AM

## 2015-07-01 NOTE — Progress Notes (Signed)
PARENTERAL NUTRITION CONSULT NOTE - FOLLOW-UP  Pharmacy Consult for TPN Indication: Prolonged Ileus  No Known Allergies  Patient Measurements: Height: 6' (182.9 cm) Weight: 196 lb 6.9 oz (89.1 kg) IBW/kg (Calculated) : 77.6 Adjusted Body Weight: 82.5 kg  Vital Signs: Temp: 98.8 F (37.1 C) (12/21 0327) Temp Source: Oral (12/21 0327) BP: 157/66 mmHg (12/21 0600) Pulse Rate: 102 (12/21 0600) Intake/Output from previous day: 12/20 0701 - 12/21 0700 In: 3956.7 [P.O.:240; I.V.:220; IV Piggyback:1320.7; TPN:2036] Out: 1500 [Urine:1500] Intake/Output from this shift:    Labs:  Recent Labs  06/29/15 0400 06/30/15 0445 07/01/15 0345  WBC 12.9* 8.2 6.7  HGB 7.6* 7.6* 7.1*  HCT 22.8* 23.4* 22.3*  PLT 302 222 226     Recent Labs  06/29/15 0400 06/29/15 1800 06/30/15 0445 07/01/15 0345  NA 144 141 142 141  K 2.9* 3.1* 3.0* 3.3*  CL 108 106 109 108  CO2 27 26 26 25   GLUCOSE 187* 161* 183* 99  BUN 20 21* 21* 20  CREATININE 0.93 0.93 0.90 0.94  CALCIUM 6.4* 6.5* 6.5* 6.4*  MG 1.7  --  2.0 2.0  PHOS 2.5  --  2.2* 2.0*  PROT 5.2*  --   --  5.0*  ALBUMIN 1.8*  --   --  1.7*  AST 26  --   --  38  ALT 16*  --   --  28  ALKPHOS 417*  --   --  319*  BILITOT 0.6  --   --  0.5  BILIDIR  --   --   --  <0.1*  IBILI  --   --   --  NOT CALCULATED  PREALBUMIN 12.4*  --   --   --   TRIG 161*  --   --   --    Estimated Creatinine Clearance: 72.2 mL/min (by C-G formula based on Cr of 0.94).    Recent Labs  06/30/15 2019 06/30/15 2306 07/01/15 0321  GLUCAP 163* 125* 114*    Medical History: Past Medical History  Diagnosis Date  . Essential hypertension   . Type 2 diabetes mellitus ([place])   . Hyperlipidemia   . Prostate cancer ([place])   . History of GI bleed   . Tachycardia   . PAC (premature atrial contraction)     Frequent   Insulin Requirements: 27 units SSI/24h Lantus 10 units  Regular Insulin 20 units in TPN  Current Nutrition: NPO (unable to take Juven  powder nutritional supplements currently)  IVF: D5W at 10 ml/hr  Central access: PICC placed 12/6 TPN start date: 12/6-12/14, resumed 12/17  ASSESSMENT                                                                                                          HPI: 26 y/oM with widely metastatic prostate cancer who presented 12/1 with abdominal pain nausea and vomiting. CT scan concerning for SBO. Patient underwent emergency laparotomy with small bowel resection on 12/2. Patient developed post-op ileus. CCS  consulted pharmacy to begin TPN for prolonged ileus  on 12/6, and patient remained on TPN until 12/14. Asked to resume TPN 12/17 for nutrition support as patient with increased n/v, unable to take POs.  Significant events:  12/10: transferred to ICU 12/11: significant increase in SCr, CBG's remain elevated 12/13: start soft diet, orders to wean TPN to off **afternoon update**PEA arrest, pt intubated, consulted to continue TPN 12/14: self extubated, TPN weaned off 12/17: more nausea and emesis, not able to take POs. NGT placed for persistent ileus. Resume TPN 12/20: start clear liquids 12/21: diet to full liquids - orders to stop TPN  Today:   Glucose -  CBGs 114-163  since start of new TPN bag last PM, (goal < 150)  Electrolytes - K+ low at 3.3, Phos low at 2, Corrected Calcium low at 8.24  Renal - SCr improved WNL  24h I/O =  +2776m. UOP down with changing of furosemide from 280mIV BID to daily.  Weight stable  LFTs - AST/ALT improved to WNL, Alk Phos elevated but trending down  TGs - 157 (12/7), 149 (12/12), 178 (12/14), 150 (12/18), 161 (12/19)  Prealbumin - 3.5 (12/6), 4.4 (12/7), 2.4 (12/12), 4.8 (12/14), 17.9 (12/18), 12.4 (12/19)  NUTRITIONAL GOALS                                                                                             RD recs (revised 06/25/15): Kcal: 2000-2200, Protein: 100-110g  Clinimix 5/15 at a goal rate of 83 ml/hr + 20% fat emulsion at 10  ml/hr to provide: 100g/day protein, 1894Kcal/day (meeting 100% protein needs and 95% kcal needs).  PLAN                                                                                                                            NaPhos 204m and KCl 59m20mVPB x 4 order per PCCM this am  At 1800 today:  CCS has d/c'd TPN orders, to run at 41ml93muntil 18:00 and d/c  Change Famotidine to 20 mg IVPB daily  Continue resistant SSI q4h. Lantus 10 units qHS per MD.   D/C lab panels on Mondays & Thursdays.  DustiDoreene ElandrmD, BCPS.   Pager: [phone] 7:11 AM

## 2015-07-01 NOTE — Progress Notes (Signed)
Subjective: Up in chair, sipping on BK liquids  Objective: Vital signs in last 24 hours: Temp:  [97.6 F (36.4 C)-100.1 F (37.8 C)] 98.8 F (37.1 C) (12/21 0327) Pulse Rate:  [95-140] 102 (12/21 0600) Resp:  [16-28] 22 (12/21 0600) BP: (121-178)/(35-103) 157/66 mmHg (12/21 0600) SpO2:  [96 %-100 %] 100 % (12/21 0600) Weight:  [196 lb 6.9 oz (89.1 kg)] 196 lb 6.9 oz (89.1 kg) (12/21 0327) Weight change: -2 lb 6.8 oz (-1.1 kg) Last BM Date: 06/30/15 Intake/Output from previous day: +2682 12/20 0701 - 12/21 0700 In: 3956.7 [P.O.:240; I.V.:220; IV Piggyback:1320.7; TPN:2036] Out: 1500 [Urine:1500] Intake/Output this shift: Total I/O In: -  Out: 250 [Urine:250]  PE: General:Pleasant affect, NAD Skin:Warm and dry, brisk capillary refill HEENT:normocephalic, sclera clear, mucus membranes moist Heart:S1S2 RRR without murmur, gallup, rub or click Lungs:diminished in the bases without rales, rhonchi, or wheezes [id number], non tender, + BS, do not palpate liver spleen or masses Ext:1+ lower ext edema, 2+ pedal pulses, 2+ radial pulses Neuro:alert and oriented X 3, MAE, follows commands, + facial symmetry Tele:  SR to slight ST   Lab Results:  Recent Labs  06/30/15 0445 07/01/15 0345  WBC 8.2 6.7  HGB 7.6* 7.1*  HCT 23.4* 22.3*  PLT 222 226   BMET  Recent Labs  06/30/15 0445 07/01/15 0345  NA 142 141  K 3.0* 3.3*  CL 109 108  CO2 26 25  GLUCOSE 183* 99  BUN 21* 20  CREATININE 0.90 0.94  CALCIUM 6.5* 6.4*   No results for input(s): TROPONINI in the last 72 hours.  Invalid input(s): CK, MB  Lab Results  Component Value Date   CHOL 167 12/10/2014   HDL 37.50* 12/10/2014   LDLCALC 108* 12/10/2014   TRIG 161* 06/29/2015   CHOLHDL 4 12/10/2014   Lab Results  Component Value Date   HGBA1C 6.3* 05/28/2015     Lab Results  Component Value Date   TSH 2.837 05/28/2015    Hepatic Function Panel  Recent Labs  07/01/15 0345  PROT 5.0*    ALBUMIN 1.7*  AST 38  ALT 28  ALKPHOS 319*  BILITOT 0.5  BILIDIR <0.1*  IBILI NOT CALCULATED   No results for input(s): CHOL in the last 72 hours. No results for input(s): PROTIME in the last 72 hours.     Studies/Results: Dg Chest Port 1 View  06/30/2015  CLINICAL DATA:  Pneumonia. EXAM: PORTABLE CHEST 1 VIEW COMPARISON:  06/29/2015 . FINDINGS: NG tube, left IJ line, right PICC line in stable position . Cardiomegaly with bilateral pulmonary interstitial prominence again noted. Findings suggest congestive heart failure. Low lung volumes with basilar atelectasis. No prominent pleural effusion or pneumothorax IMPRESSION: 1. Lines and tubes in stable position. 2. Cardiomegaly with pulmonary interstitial prominence consistent with congestive heart failure. Similar findings noted on prior exam. 3. Lung volumes with bibasilar atelectasis . Electronically Signed   By: [name]  Register   On: 06/30/2015 07:16    Medications: I have reviewed the patient's current medications. Scheduled Meds: . antiseptic oral rinse  7 mL Mouth Rinse q12n4p  . chlorhexidine  15 mL Mouth Rinse BID  . furosemide  20 mg Intravenous Daily  . heparin subcutaneous  5,000 Units Subcutaneous 3 times per day  . hydrALAZINE  10 mg Intravenous 6 times per day  . insulin aspart  0-20 Units Subcutaneous Q4H  . insulin glargine  10 Units Subcutaneous QHS  . labetalol  20 mg Intravenous Q6H  . lip balm  1 application Topical BID  . ondansetron (ZOFRAN) IV  4 mg Intravenous 6 times per day  . potassium chloride  10 mEq Intravenous Q1 Hr x 4  . prednisoLONE acetate  1 drop Both Eyes BID  . sodium phosphate  Dextrose 5% IVPB  20 mmol Intravenous Once   Continuous Infusions: . dextrose 10 mL/hr at 06/30/15 2000  . niCARDipine Stopped (06/29/15 1000)   PRN Meds:.bisacodyl, fentaNYL (SUBLIMAZE) injection, magic mouthwash, methocarbamol (ROBAXIN)  IV, sodium chloride  Assessment/Plan: 84M with stage 4 prostate CA, HTN,  DM, recent GIB, chronic-appearing sinus tach (with PACs) admitted with SBO. S/p ex-lap/LOA [id number], initially complicated by post-op ileus, fever, enterococcal UTI, sinus tachycardia. LE duplex/CTA 12/9 neg for DVT/PE. 2D Echo 06/19/15: mod LVH, mid LV cavity systolic obliteration with increased mid-cavitary gradient, EF 65%, PASP 34. Developed ventilator dependent respiratory failure on 06/23/15 a/w bradycardic to PEA arrest - time to ROSC 6 minutes, received 2 amps of epinephrine - felt to be primary respiratory event. Has also had acute encephalopathy with periodic confusion this admission. Cardiology following for acute on chronic diastolic CHF and sinus tach.  1. Acute on chronic diastolic CHF with hypertensive heart disease - inpatient weights are extremely variable, now at 198 which is the lowest weight for quite some time. CXR still with mild CHF-no acute changes today. Suspect component of his edema is due to severe hypoalbuminemia. Will review plan for diuretic with MD (currently on Lasix 20mg  IV daily with stable renal function)  + 2682  last 24 hours. -echo with hyperdynamic LV function but less cavity obliteration- EF 65-70% + LVH  2. Chronic sinus tachycardia with PACs - on higher-dose diltiazem at home (360mg  daily) now stopped as well as carvedilol 12.5mg  BID also stopped. Now on IV labetalol every 6 hours 20 mg (nicardipine stopped). HR controlled.  3. Essential HTN - on meds as above along with IV hydralazine which was titrated by primary team today to every 4 hours and IV labetalol.  4. PEA arrest 06/23/15 - felt to be primary resp event. Troponin after event was negative. Maintaining SR  5. Chronic anemia - baseline Hgb appears 7-9.  Today lower at 7.1 may benefit from transfusion   6. Hypokalemia/hypocalcemia - lytes being managed by primary team. Continues with hypokalemia  7. Nutrition per primary team-TPN      LOS: 20 days   Time spent with pt. :15  minutes. [facility] R  Nurse Practitioner Certified Pager [phone] or after 5pm and on weekends call [phone] 07/01/2015, 8:30 AM  Agree with above assessment.  Hemoglobin trending down as noted above.  The patient is complaining of some discomfort in his left calf.  The nurse had spoken with Dr. Marlou Starks and a venous Doppler is being requested. Rhythm remains normal sinus rhythm.  His lungs are clear.  Peripheral edema about the same.

## 2015-07-01 NOTE — Progress Notes (Signed)
OT Cancellation Note  Patient Details Name: Jeremy Jennings MRN: [id number] DOB: [birth year]   Cancelled Treatment:    Reason Eval/Treat Not Completed: Patient at procedure or test/ unavailable. Will check back later today or tomorrow.  Donnesha Karg 07/01/2015, 12:42 PM  Lesle Chris, OTR/L [phone] 07/01/2015

## 2015-07-01 NOTE — Progress Notes (Signed)
[place] Progress Note Patient Name: Jeremy Jennings DOB: 1936-03-25 MRN: [id number]   Date of Service  07/01/2015  HPI/Events of Note  K+ = 3.3, PO4--- = 2.0, Ca++ = 6.4 (albumin = 1.8 - Ca++ corrects to 8.16) and Creatinine = 0.94.  eICU Interventions  Will replete K+, PO4--- and Ca++.     Intervention Category Major Interventions: Electrolyte abnormality - evaluation and management  Sommer,Steven Eugene 07/01/2015, 4:38 AM

## 2015-07-01 NOTE — Progress Notes (Signed)
CRITICAL VALUE ALERT  Critical value received:  Calcium  Date of notification:  07/01/15  Time of notification:  0430  Critical value read back:Yes.    Nurse who received alert:  Rosetta Posner  MD notified (1st page):  Dr. Oletta Darter  Time of first page:  [phone]  MD notified (2nd page):  Time of second page:  Responding MD:  Dr. Oletta Darter  Time MD responded:  [phone]

## 2015-07-01 NOTE — Progress Notes (Signed)
*  Preliminary Results* Left lower extremity venous duplex completed. Study was technically limited due to edema. Visualized veins of the left lower extremity are negative for deep vein thrombosis. There is no evidence of left Baker's cyst.  07/01/2015 2:36 PM  Maudry Mayhew, RVT, RDCS, RDMS

## 2015-07-01 NOTE — Progress Notes (Signed)
19 Days Post-Op  Subjective: No complaints. Having bm's. Tolerating clears  Objective: Vital signs in last 24 hours: Temp:  [97.9 F (36.6 C)-100.1 F (37.8 C)] 98.6 F (37 C) (12/21 1200) Pulse Rate:  [98-115] 101 (12/21 1230) Resp:  [19-28] 24 (12/21 1230) BP: (121-178)/(35-73) 136/62 mmHg (12/21 1230) SpO2:  [96 %-100 %] 100 % (12/21 1230) Weight:  [89.1 kg (196 lb 6.9 oz)] 89.1 kg (196 lb 6.9 oz) (12/21 0327) Last BM Date: 07/01/15  Intake/Output from previous day: 12/20 0701 - 12/21 0700 In: 3956.7 [P.O.:240; I.V.:220; IV Piggyback:1320.7; TPN:2036] Out: 1500 [Urine:1500] Intake/Output this shift: Total I/O In: 456.9 [IV Piggyback:100; TPN:356.9] Out: 250 [Urine:250]  Resp: clear to auscultation bilaterally Cardio: regular rate and rhythm GI: soft, nontender. incision looks good. good bs  Lab Results:   Recent Labs  06/30/15 0445 07/01/15 0345  WBC 8.2 6.7  HGB 7.6* 7.1*  HCT 23.4* 22.3*  PLT 222 226   BMET  Recent Labs  06/30/15 0445 07/01/15 0345  NA 142 141  K 3.0* 3.3*  CL 109 108  CO2 26 25  GLUCOSE 183* 99  BUN 21* 20  CREATININE 0.90 0.94  CALCIUM 6.5* 6.4*   PT/INR No results for input(s): LABPROT, INR in the last 72 hours. ABG No results for input(s): PHART, HCO3 in the last 72 hours.  Invalid input(s): PCO2, PO2  Studies/Results: Dg Chest Port 1 View  06/30/2015  CLINICAL DATA:  Pneumonia. EXAM: PORTABLE CHEST 1 VIEW COMPARISON:  06/29/2015 . FINDINGS: NG tube, left IJ line, right PICC line in stable position . Cardiomegaly with bilateral pulmonary interstitial prominence again noted. Findings suggest congestive heart failure. Low lung volumes with basilar atelectasis. No prominent pleural effusion or pneumothorax IMPRESSION: 1. Lines and tubes in stable position. 2. Cardiomegaly with pulmonary interstitial prominence consistent with congestive heart failure. Similar findings noted on prior exam. 3. Lung volumes with bibasilar  atelectasis . Electronically Signed   By: [name]  Register   On: 06/30/2015 07:16    Anti-infectives: Anti-infectives    Start     Dose/Rate Route Frequency Ordered Stop   06/23/15 1700  vancomycin (VANCOCIN) IVPB 1000 mg/200 mL premix  Status:  Discontinued     1,000 mg 200 mL/hr over 60 Minutes Intravenous Every 12 hours 06/23/15 1414 06/26/15 1000   06/23/15 1430  piperacillin-tazobactam (ZOSYN) IVPB 3.375 g     3.375 g 12.5 mL/hr over 240 Minutes Intravenous Every 8 hours 06/23/15 1416 06/30/15 0227   06/23/15 1415  piperacillin-tazo (ZOSYN) NICU IV syringe 200 mg/mL  Status:  Discontinued     75 mg/kg  105.4 kg 79 mL/hr over 30 Minutes Intravenous Every 8 hours 06/23/15 1401 06/23/15 1416   06/23/15 1000  levofloxacin (LEVAQUIN) tablet 750 mg  Status:  Discontinued     750 mg Oral Daily 06/23/15 0750 06/23/15 1401   06/22/15 1600  vancomycin (VANCOCIN) IVPB 1000 mg/200 mL premix  Status:  Discontinued     1,000 mg 200 mL/hr over 60 Minutes Intravenous Every 12 hours 06/22/15 1346 06/23/15 0740   06/20/15 0200  vancomycin (VANCOCIN) 1,250 mg in sodium chloride 0.9 % 250 mL IVPB  Status:  Discontinued     1,250 mg 166.7 mL/hr over 90 Minutes Intravenous Every 12 hours 06/19/15 1314 06/22/15 1346   06/19/15 1400  vancomycin (VANCOCIN) 2,000 mg in sodium chloride 0.9 % 500 mL IVPB     2,000 mg 250 mL/hr over 120 Minutes Intravenous  Once 06/19/15 1313 06/19/15 1737  06/19/15 1315  piperacillin-tazobactam (ZOSYN) IVPB 3.375 g  Status:  Discontinued     3.375 g 12.5 mL/hr over 240 Minutes Intravenous Every 8 hours 06/19/15 1305 06/23/15 0740   06/19/15 1100  cefTRIAXone (ROCEPHIN) 2 g in dextrose 5 % 50 mL IVPB  Status:  Discontinued     2 g 100 mL/hr over 30 Minutes Intravenous Every 24 hours 06/19/15 1020 06/19/15 1246   06/12/15 0930  cefoTEtan (CEFOTAN) 2 g in dextrose 5 % 50 mL IVPB     2 g 100 mL/hr over 30 Minutes Intravenous On call to O.R. 06/12/15 0852 06/12/15 1359       Assessment/Plan: s/p Procedure(s): EXPLORATORY LAPAROTOMY (N/A) SMALL BOWEL RESECTION (N/A) Advance diet  Ambulate D/c tpn D/c foley  LOS: 20 days    TOTH III,PAUL S 07/01/2015

## 2015-07-02 ENCOUNTER — Inpatient Hospital Stay (HOSPITAL_COMMUNITY): Payer: Medicare Other

## 2015-07-02 LAB — GLUCOSE, CAPILLARY
GLUCOSE-CAPILLARY: 91 mg/dL (ref 65–99)
GLUCOSE-CAPILLARY: 112 mg/dL — AB (ref 65–99)
GLUCOSE-CAPILLARY: 97 mg/dL (ref 65–99)
Glucose-Capillary: 104 mg/dL — ABNORMAL HIGH (ref 65–99)
Glucose-Capillary: 86 mg/dL (ref 65–99)
Glucose-Capillary: 118 mg/dL — ABNORMAL HIGH (ref 65–99)

## 2015-07-02 LAB — CBC
HEMATOCRIT: 21.5 % — AB (ref 39.0–52.0)
Hemoglobin: 7 g/dL — ABNORMAL LOW (ref 13.0–17.0)
MCH: 29.2 pg (ref 26.0–34.0)
MCHC: 32.6 g/dL (ref 30.0–36.0)
MCV: 89.6 fL (ref 78.0–100.0)
PLATELETS: 212 10*3/uL (ref 150–400)
RBC: 2.4 MIL/uL — ABNORMAL LOW (ref 4.22–5.81)
RDW: 17.1 % — AB (ref 11.5–15.5)
WBC: 8.6 10*3/uL (ref 4.0–10.5)

## 2015-07-02 LAB — BASIC METABOLIC PANEL
Anion gap: 10 (ref 5–15)
BUN: 17 mg/dL (ref 6–20)
CALCIUM: 6.3 mg/dL — AB (ref 8.9–10.3)
CO2: 23 mmol/L (ref 22–32)
Chloride: 108 mmol/L (ref 101–111)
Creatinine, Ser: 0.98 mg/dL (ref 0.61–1.24)
GFR calc Af Amer: >60 mL/min (ref >60)
GLUCOSE: 82 mg/dL (ref 65–99)
Potassium: 3.7 mmol/L (ref 3.5–5.1)
Sodium: 141 mmol/L (ref 135–145)

## 2015-07-02 LAB — MAGNESIUM: Magnesium: 1.9 mg/dL (ref 1.7–2.4)

## 2015-07-02 LAB — PHOSPHORUS: Phosphorus: 2 mg/dL — ABNORMAL LOW (ref 2.5–4.6)

## 2015-07-02 MED ORDER — HYDRALAZINE HCL 25 MG PO TABS
25.0000 mg | ORAL_TABLET | Freq: Three times a day (TID) | ORAL | Status: DC
  Administered 2015-07-02 – 2015-07-03 (×4): 25 mg via ORAL
  Filled 2015-07-02 (×4): qty 1

## 2015-07-02 MED ORDER — FUROSEMIDE 20 MG PO TABS
20.0000 mg | ORAL_TABLET | Freq: Every day | ORAL | Status: DC
  Administered 2015-07-03: 20 mg via ORAL
  Filled 2015-07-02: qty 1

## 2015-07-02 MED ORDER — BOOST / RESOURCE BREEZE PO LIQD: 1.0000 | Freq: Three times a day (TID) | ORAL | Status: DC

## 2015-07-02 MED ORDER — BOOST PLUS PO LIQD
237.0000 mL | Freq: Two times a day (BID) | ORAL | Status: DC
  Administered 2015-07-03: 237 mL via ORAL
  Filled 2015-07-02 (×4): qty 237

## 2015-07-02 MED ORDER — CARVEDILOL 12.5 MG PO TABS
12.5000 mg | ORAL_TABLET | Freq: Two times a day (BID) | ORAL | Status: DC
  Administered 2015-07-02 – 2015-07-03 (×2): 12.5 mg via ORAL
  Filled 2015-07-02 (×2): qty 1

## 2015-07-02 MED ORDER — CALCIUM CITRATE 950 (200 CA) MG PO TABS
200.0000 mg | ORAL_TABLET | Freq: Two times a day (BID) | ORAL | Status: DC
  Administered 2015-07-02 – 2015-07-03 (×3): 200 mg via ORAL
  Filled 2015-07-02 (×5): qty 1

## 2015-07-02 MED ORDER — POTASSIUM & SODIUM PHOSPHATES 280-160-250 MG PO PACK
1.0000 | PACK | Freq: Two times a day (BID) | ORAL | Status: DC
  Administered 2015-07-02 – 2015-07-03 (×2): 1 via ORAL
  Filled 2015-07-02 (×4): qty 1

## 2015-07-02 NOTE — Progress Notes (Signed)
Subjective: Up in chair- no chest pain no SOb  Objective: Vital signs in last 24 hours: Temp:  [98.4 F (36.9 C)-100.7 F (38.2 C)] 100.1 F (37.8 C) (12/22 0400) Pulse Rate:  [101-116] 111 (12/22 0800) Resp:  [18-28] 19 (12/22 0800) BP: (122-157)/(45-102) 122/45 mmHg (12/22 0800) SpO2:  [93 %-100 %] 93 % (12/22 0800) Weight change:  Last BM Date: 07/01/15 Intake/Output from previous day:-157 12/21 0701 - 12/22 0700 In: 1042.9 [P.O.:120; I.V.:110; IV Piggyback:100; TPN:712.9] Out: 1200 [Urine:1200] Intake/Output this shift:    PE: General:Pleasant affect, NAD Skin:Warm and dry, brisk capillary refill HEENT:normocephalic, sclera clear, mucus membranes moist Heart:S1S2 RRR without murmur, gallup, rub or click Lungs:diminished without rales, rhonchi, or wheezes [id number], non tender, + BS, do not palpate liver spleen or masses Ext: +1 lower ext edema,  2+ radial pulses Neuro:alert and oriented X 3, MAE, follows commands, + facial symmetry Tele:  ST 100-115   Lab Results:  Recent Labs  07/01/15 0345 07/02/15 0500  WBC 6.7 8.6  HGB 7.1* 7.0*  HCT 22.3* 21.5*  PLT 226 212   BMET  Recent Labs  07/01/15 0345 07/02/15 0500  NA 141 141  K 3.3* 3.7  CL 108 108  CO2 25 23  GLUCOSE 99 82  BUN 20 17  CREATININE 0.94 0.98  CALCIUM 6.4* 6.3*   No results for input(s): TROPONINI in the last 72 hours.  Invalid input(s): CK, MB  Lab Results  Component Value Date   CHOL 167 12/10/2014   HDL 37.50* 12/10/2014   LDLCALC 108* 12/10/2014   TRIG 161* 06/29/2015   CHOLHDL 4 12/10/2014   Lab Results  Component Value Date   HGBA1C 6.3* 05/28/2015     Lab Results  Component Value Date   TSH 2.837 05/28/2015    Hepatic Function Panel  Recent Labs  07/01/15 0345  PROT 5.0*  ALBUMIN 1.7*  AST 38  ALT 28  ALKPHOS 319*  BILITOT 0.5  BILIDIR <0.1*  IBILI NOT CALCULATED   No results for input(s): CHOL in the last 72 hours. No results for  input(s): PROTIME in the last 72 hours.     Studies/Results: No results found.  Medications: I have reviewed the patient's current medications. Scheduled Meds: . antiseptic oral rinse  7 mL Mouth Rinse q12n4p  . chlorhexidine  15 mL Mouth Rinse BID  . famotidine (PEPCID) IV  20 mg Intravenous Q24H  . feeding supplement  1 Container Oral TID BM  . furosemide  20 mg Intravenous Daily  . heparin subcutaneous  5,000 Units Subcutaneous 3 times per day  . hydrALAZINE  10 mg Intravenous 6 times per day  . insulin aspart  0-20 Units Subcutaneous Q4H  . insulin glargine  10 Units Subcutaneous QHS  . labetalol  20 mg Intravenous Q6H  . lip balm  1 application Topical BID  . ondansetron (ZOFRAN) IV  4 mg Intravenous 6 times per day  . prednisoLONE acetate  1 drop Both Eyes BID   Continuous Infusions: . dextrose 10 mL/hr at 06/30/15 2000  . niCARDipine Stopped (06/29/15 1000)   PRN Meds:.bisacodyl, fentaNYL (SUBLIMAZE) injection, magic mouthwash, methocarbamol (ROBAXIN)  IV, sodium chloride  Assessment/Plan: 56M with stage 4 prostate CA, HTN, DM, recent GIB, chronic-appearing sinus tach (with PACs) admitted with SBO. S/p ex-lap/LOA [id number], initially complicated by post-op ileus, fever, enterococcal UTI, sinus tachycardia. LE duplex/CTA 12/9 neg for DVT/PE. 2D Echo 06/19/15: mod LVH, mid LV cavity systolic  obliteration with increased mid-cavitary gradient, EF 65%, PASP 34. Developed ventilator dependent respiratory failure on 06/23/15 a/w bradycardic to PEA arrest - time to ROSC 6 minutes, received 2 amps of epinephrine - felt to be primary respiratory event. Has also had acute encephalopathy with periodic confusion this admission. Cardiology following for acute on chronic diastolic CHF and sinus tach.  1. Acute on chronic diastolic CHF with hypertensive heart disease - inpatient weights are extremely variable, now at 198/196 which is the lowest weight for quite some time. CXR still with mild  CHF-no acute changes 12/20. Suspect component of his edema is due to severe hypoalbuminemia. Will review plan for diuretic with MD (currently on Lasix 20mg  IV daily with stable renal function) -157 last 24 hours.  But since admit +2490 -echo with hyperdynamic LV function but less cavity obliteration- EF 65-70% + LVH --lower ext edema neg venous doppler  2. Chronic sinus tachycardia with PACs - on higher-dose diltiazem at home (360mg  daily) now stopped as well as carvedilol 12.5mg  BID also stopped. Now on IV labetalol every 6 hours 20 mg (nicardipine stopped). HR around 110-115.  3. Essential HTN - on meds as above along with IV hydralazine which was titrated by primary team today to every 4 hours and IV labetalol.  BP 155-122/59-45  4. PEA arrest 06/23/15 - felt to be primary resp event. Troponin after event was negative. Maintaining SR-ST  5. Chronic anemia - baseline Hgb appears 7-9. Today lower at 7.0 may benefit from transfusion   6. Hypokalemia/hypocalcemia - lytes being managed by primary team. Continues with hypokalemia  7. Nutrition per primary team-TPN- stopping now pt eating   8. S/P EXPLORATORY LAPAROTOMY (N/A) SMALL BOWEL RESECTION improving POD 20  9. Low grade fever, check CXR.  LOS: 21 days   Time spent with pt. :15 minutes. [facility] R  Nurse Practitioner Certified Pager [phone] or after 5pm and on weekends call [phone] 07/02/2015, 8:19 AM   Agree with above assessment.  We will continue to avoid calcium blocker because of its potential adverse effect on GI motility.  We will switch him from IV labetalol back to carvedilol.  We will switch him to oral hydralazine.  He is tolerating oral intake adequately.  We will also switch him to oral Lasix.  He is being transferred up to telemetry today.

## 2015-07-02 NOTE — Progress Notes (Addendum)
Brief encounter for continued Emotional and spiritual support with daughter, [name] her spouse.    Conversation around LOS, hopes for discharge plans.    [place], [place]

## 2015-07-02 NOTE — Progress Notes (Signed)
Date: July 02, 2015 Chart reviewed for concurrent status and case management needs. Will continue to follow patient for changes and needs: Velva Harman, RN, BSN, [place]   [phone]

## 2015-07-02 NOTE — Progress Notes (Signed)
PULMONARY / CRITICAL CARE MEDICINE   Name: Jeremy Jennings MRN: [id number] DOB: [birth year]    ADMISSION DATE:  06/11/2015 CONSULTATION DATE:  06/23/15  REFERRING MD :  CCS  CHIEF COMPLAINT:  Acute respiratory failure, cardiac arrest   SUBJECTIVE: Sitting up in chair in no distress. Awake and oriented. Walked in the hallways yesterday without any issue.   VITAL SIGNS: RA   Temp:  [98.4 F (36.9 C)-100.7 F (38.2 C)] 100.3 F (37.9 C) (12/22 0800) Pulse Rate:  [101-116] 111 (12/22 0800) Resp:  [18-28] 19 (12/22 0800) BP: (122-157)/(45-102) 122/45 mmHg (12/22 0800) SpO2:  [93 %-100 %] 93 % (12/22 0800)    INTAKE / OUTPUT:  Intake/Output Summary (Last 24 hours) at 07/02/15 1001 Last data filed at 07/02/15 0800  Gross per 24 hour  Intake  992.9 ml  Output   1050 ml  Net  -57.1 ml    PHYSICAL EXAMINATION: General:  Chronically ill appearing elderly male in NAD Neuro:  Awake, oriented. PERRLA, no gross focal deficits HEENT: NGT,  moist mucous membranes, Cardiovascular:  S1-S2, no murmurs rubs gallops Lungs:  Nonlabored, clear. No wheeze or crackles Abdomen:  Mild distention, soft, positive bowel sounds  Skin:  Intact, abd incision clean.  LABS:  CBC  Recent Labs Lab 06/30/15 0445 07/01/15 0345 07/02/15 0500  WBC 8.2 6.7 8.6  HGB 7.6* 7.1* 7.0*  HCT 23.4* 22.3* 21.5*  PLT 222 226 212   Coag's No results for input(s): APTT, INR in the last 168 hours. BMET  Recent Labs Lab 06/30/15 0445 07/01/15 0345 07/02/15 0500  NA 142 141 141  K 3.0* 3.3* 3.7  CL 109 108 108  CO2 26 25 23   BUN 21* 20 17  CREATININE 0.90 0.94 0.98  GLUCOSE 183* 99 82   Electrolytes  Recent Labs Lab 06/30/15 0445 07/01/15 0345 07/02/15 0500  CALCIUM 6.5* 6.4* 6.3*  MG 2.0 2.0 1.9  PHOS 2.2* 2.0* 2.0*   Sepsis Markers  Recent Labs Lab 06/26/15 0500  PROCALCITON 0.83   ABG No results for input(s): PHART, PCO2ART, PO2ART in the last 168 hours. Liver Enzymes  Recent  Labs Lab 06/28/15 0527 06/29/15 0400 07/01/15 0345  AST 27 26 38  ALT 19 16* 28  ALKPHOS 538* 417* 319*  BILITOT 0.7 0.6 0.5  ALBUMIN 1.9* 1.8* 1.7*   Cardiac Enzymes No results for input(s): TROPONINI, PROBNP in the last 168 hours. Glucose  Recent Labs Lab 07/01/15 1150 07/01/15 1549 07/01/15 2002 07/01/15 2354 07/02/15 0336 07/02/15 0755  GLUCAP 134* 94 108* 104* 86 91    Imaging No results found. ASSESSMENT / PLAN:  Discussion:  79 year old M with PMH of metastatic prostate cancer admitted on 12/1 with abdominal pain.  CT concerning for small bowel obstruction. To OR, status post exploratory laparoscopy with bowel resection 12/2 for small bowel obstruction. Post-op course complicated by tachycardia, volume overload, respiratory distress.  Required intubation 12/13 for acute respiratory failure with wheezing and increased work of breathing. Brief PEA arrest.  Self-extubated 12/14.  Ongoing issues - ? HCAP, volume excess, malnutrition / not eating and critical illness deconditioning.    STUDIES:  LE Dopplers 12/9 >> negative for DVT CT scan chest 12/9>> negative for PE, bibasilar pleural effusions and atelectasis Echo 12/9 >>LVEF [id number], mid-systolic LV cavitary obliteration, moderate LVH, upper normal LA size, mild TR, RVSP 34 mmHg + RAP (IVC not visualized). Chest x-ray 12/13 >> left basilar opacity, concerning for pneumonia.  SIGNIFICANT EVENTS: 12/02  Exploratory laparoscopy,  small bowel resection 12/13  PEA arrest, intubated 12/14  Self extubated 12/17  Family requesting transfer to [facility] for second opinion 12/18  No pressors or vent, mild HTN.  Net neg 5.3 liters  CULTURES BCx2 12/18 >> UC 12/9 >> Enterococcus Sputum  12/13 >> neg  ANTIBIOTICS Vano, start date 12/13 >> 12/16 Zosyn, start date 12/13 >> 12/19   PULMONARY OETT 12/13 >> 12/14 A: HCAP - questionable, NOS, PCT normal. No fever. Former Smoker - 50+ years ago, no hx of COPD   No PE Pulmonary  Edema  P:   Pulmonary hygiene: IS, flutter, mobilize Ambulate Intermittent CXR  CARDIOVASCULAR CVL A:  Sinus Tachycardia Diastolic CHF Hypertension P:  Lasix 20 mg IV QD No cardizem - ileus Hydralazine, labetolol for BP  RENAL A:   Hypernatremia (TPN) Hypokalemia Hypophosphatemia  P:   Monitor BUN/creatinine, urine output Ca is 6.3, corrects to 8.1 for albumin Will replete Ca and Phos  GASTROINTESTINAL A:   Small bowel obstruction with hernia Status post exploratory laparoscopy, small bowel resection Postop ileus Sever Protein Calorie Malnutrition.  P:   Advancing diet.  HEMATOLOGIC A:  Anemia - stable, secondary to critical illness Resolving leukocytosis Hx Prostate Cancer - ONC eval, Rx on hold currently P:  Monitor CBC DVT proph with Hep SQ  INFECTIOUS A:  HCAP Enterococcus UTI. P:   ABX / Cultures as above Monitor fever curve / WBC  ENDOCRINE A:  Diabetes mellitus - glu controlled, occ hypoglycemia P:   Continue Lantus. SSI, resistant scale  NEUROLOGIC A:   Pain - post op  P:   PRN fentanyl  PT as tolerated   FAMILY  - Updates:  Daughter updated at bedside 12/21.   - Inter-disciplinary family meet or Palliative Care meeting due by: 12/20.   Patient is scheduled for transfer to the floor today. PCCM will sign off. [facility] can resume care for medical issues.  Marshell Garfinkel MD [facility] Pager [phone] If no answer or after 3pm call: [phone] 07/02/2015, 10:01 AM

## 2015-07-02 NOTE — Progress Notes (Signed)
Nutrition Follow-up  DOCUMENTATION CODES:   Not applicable  INTERVENTION:  - Will d/c Boost Breeze per pt preference - Will order Boost Plus BID, each supplement provides 360 kcal and 14 grams of protein. - Provide encouragement with meals and supplements - RD will continue to monitor for needs  NUTRITION DIAGNOSIS:   Inadequate oral intake related to inability to eat as evidenced by NPO status. -resolving with diet advancement, continues with poor intakes  GOAL:   Patient will meet greater than or equal to 90% of their needs -unmet with TPN now off  MONITOR:   PO intake, Supplement acceptance, Weight trends, Labs, Skin, I & O's  ASSESSMENT:   79 yo black male who has a history of stage 4 prostate cancer, HTN, DM, tachycardia, recent GI bleed. He had an endoscopy at that time of admission but apparently deferred an inpatient colonoscopy. He just had his colonoscopy done by Dr. Watt Climes on Tuesday. By report, this was normal with no source of bleeding identified. He denies any further bloody BMs sin79 yo black male who has a history of stage 4 prostate cancer, HTN, DM, tachycardia, recent GI bleed. He had an endoscopy at that time of admission but apparently deferred an inpatient colonoscopy. He just had his colonoscopy done by Dr. Watt Climes on Tuesday. By report, this was normal with no source of bleeding identified. He denies any further bloody BMs since this admission. This morning around 6 or 7 am, the patient began having centralized crampy abdominal pain. He denies nausea or vomiting, but admits to no flatus. He deals with constipation on a regular basis. Due to persistent pain, the patient presented to [facility] for further evaluation.ce this admission. This morning around 6 or 7 am, the patient began having centralized crampy abdominal pain. He denies nausea or vomiting, but admits to no flatus. He deals with constipation on a regular basis. Due to persistent pain, the patient  presented to [facility] for further evaluation.  12/22 Order was placed yesterday (12/21) for TPN to be stopped. Diet has been advancing with most recent advancement from [place] to Soft this AM at 0747. Pt with [place] toast and hard boiled egg on breakfast tray. Pt reports poor appetite but that he is trying to eat a little bit of breakfast this AM. He denies abdominal pain this AM with intakes and denies nausea over the past few days. Surgeon note from this AM reviewed. Talked with pt about the addition of nutrition supplements and explained the benefits of them to pt. He would prefer to have Boost Plus rather than Boost Breeze; will make changes as outlined above and will increase frequency of Boost Plus if needed.  Pt not meeting needs at this time. Medications reviewed. Labs reviewed; CBGs: 86-203 mg/dL, Ca: 6.3 mg/dL, Phos: 2 mg/dL.     12/19 - New consult for re-start of TPN received.  - Pt has been NPO since 06/26/15. Pt currently receiving Clinimix E 5/15 @ 70 mL/hr which is providing 84 grams protein (84% minimum estimated needs) and 1193 kcal (60% minimum estimated needs).  - Pharmacy indicates goal TPN: Clinimix E 5/15 @ 90 mL/hr with 20% lipids @ 10 mL/hr which will provide 108 grams protein and 2014 kcal which will meet 100% estimated protein and kcal needs.  Per pharmacy note yesterday (12/18) at 1103: At 1800 today:  Increase Clinimix E 5/15 to 70 ml/hr. Could use Clinimix 5/20 to obtain higher Kcal while withholding lipids, but due to recent uncontrolled CBGs and  elevated liver enzymes (Alk Phos), will opt to use 5/15 at this time.   Withhold IV fat emulsion for 1st seven days of ICU stay per SCCM/ASPEN guidelines (today is day 6).  Continue 15 units regular insulin/24 hours in TPN.  Add Famotidine 20 mg to TPN bag. D/C IVPB order.  Plan to advance as tolerated to the goal rate.  TPN to contain standard multivitamins and trace elements.  Continue IVF per MD order.  Monitor  electrolytes closely-if hypernatremia persists despite adding D5W IVF, will plan to remove electrolytes in TPN tomorrow. - Per Cardiology note 12/17 @ [phone] pt was diuresed with Lasix with 3L removed.   12/15 - RD was consulted for initiation of tube feeding but pt later self extubated.  - TPN was stopped yesterday after extubation.  - Pt remains off vent and was started on clear liquids. - Pt reports doing well on clears.  - He states he does feel hungry.  - Pt is willing to try a clear liquid supplement. - Initially wanted to order Boost Breeze for patient but d/t elevated CBGs will order Juven BID instead.    Diet Order:  DIET SOFT Room service appropriate?: Yes; Fluid consistency:: Thin  Skin:  Wound (see comment) (Abdominal incision from 06/12/15)  Last BM:  12/22  Height:   Ht Readings from Last 1 Encounters:  06/23/15 6' (1.829 m)    Weight:   Wt Readings from Last 1 Encounters:  07/01/15 196 lb 6.9 oz (89.1 kg)    Ideal Body Weight:  80.91 kg  BMI:  Body mass index is 26.63 kg/(m^2).  Estimated Nutritional Needs:   Kcal:  2000-2200  Protein:  100-110g  Fluid:  2L/day  EDUCATION NEEDS:   No education needs identified at this time     Jarome Matin, RD, LDN Inpatient Clinical Dietitian Pager # [phone] After hours/weekend pager # [phone]

## 2015-07-02 NOTE — Progress Notes (Signed)
Patient ID: Jeremy Jennings, male   DOB: 1935/10/15, 79 y.o.   MRN: [id number] 20 Days Post-Op  Subjective: Pt feels well today.  Tolerating fulls, but not much appetite.  Having BMs.  Breathing is good.  Calf doesn't hurt anymore today.  Wants to get up  Objective: Vital signs in last 24 hours: Temp:  [97.9 F (36.6 C)-100.7 F (38.2 C)] 100.1 F (37.8 C) (12/22 0400) Pulse Rate:  [101-116] 108 (12/22 0600) Resp:  [18-28] 28 (12/22 0600) BP: (133-157)/(45-102) 148/45 mmHg (12/22 0600) SpO2:  [93 %-100 %] 95 % (12/22 0600) Last BM Date: 07/01/15  Intake/Output from previous day: 12/21 0701 - 12/22 0700 In: 1042.9 [P.O.:120; I.V.:110; IV Piggyback:100; TPN:712.9] Out: 1200 [Urine:1200] Intake/Output this shift:    PE: Abd: soft, +Bs, minimally tender, midline wound healing well with steri-strips in place Heart: tachy Lungs: CTAB  Lab Results:   Recent Labs  07/01/15 0345 07/02/15 0500  WBC 6.7 8.6  HGB 7.1* 7.0*  HCT 22.3* 21.5*  PLT 226 212   BMET  Recent Labs  07/01/15 0345 07/02/15 0500  NA 141 141  K 3.3* 3.7  CL 108 108  CO2 25 23  GLUCOSE 99 82  BUN 20 17  CREATININE 0.94 0.98  CALCIUM 6.4* 6.3*   PT/INR No results for input(s): LABPROT, INR in the last 72 hours. CMP     Component Value Date/Time   NA 141 07/02/2015 0500   NA 142 05/18/2015 0838   K 3.7 07/02/2015 0500   K 3.5 05/18/2015 0838   CL 108 07/02/2015 0500   CO2 23 07/02/2015 0500   CO2 18* 05/18/2015 0838   GLUCOSE 82 07/02/2015 0500   GLUCOSE 84 05/18/2015 0838   BUN 17 07/02/2015 0500   BUN 18.3 05/18/2015 0838   CREATININE 0.98 07/02/2015 0500   CREATININE 0.8 05/18/2015 0838   CALCIUM 6.3* 07/02/2015 0500   CALCIUM 8.4 05/18/2015 0838   PROT 5.0* 07/01/2015 0345   PROT 6.5 05/18/2015 0838   ALBUMIN 1.7* 07/01/2015 0345   ALBUMIN 2.4* 05/18/2015 0838   AST 38 07/01/2015 0345   AST 18 05/18/2015 0838   ALT 28 07/01/2015 0345   ALT <9 05/18/2015 0838   ALKPHOS 319*  07/01/2015 0345   ALKPHOS 488* 05/18/2015 0838   BILITOT 0.5 07/01/2015 0345   BILITOT 0.33 05/18/2015 0838   GFRNONAA >60 07/02/2015 0500   GFRAA >60 07/02/2015 0500   Lipase     Component Value Date/Time   LIPASE 46 11/09/2014 1137       Studies/Results: No results found.  Anti-infectives: Anti-infectives    Start     Dose/Rate Route Frequency Ordered Stop   06/23/15 1700  vancomycin (VANCOCIN) IVPB 1000 mg/200 mL premix  Status:  Discontinued     1,000 mg 200 mL/hr over 60 Minutes Intravenous Every 12 hours 06/23/15 1414 06/26/15 1000   06/23/15 1430  piperacillin-tazobactam (ZOSYN) IVPB 3.375 g     3.375 g 12.5 mL/hr over 240 Minutes Intravenous Every 8 hours 06/23/15 1416 06/30/15 0227   06/23/15 1415  piperacillin-tazo (ZOSYN) NICU IV syringe 200 mg/mL  Status:  Discontinued     75 mg/kg  105.4 kg 79 mL/hr over 30 Minutes Intravenous Every 8 hours 06/23/15 1401 06/23/15 1416   06/23/15 1000  levofloxacin (LEVAQUIN) tablet 750 mg  Status:  Discontinued     750 mg Oral Daily 06/23/15 0750 06/23/15 1401   06/22/15 1600  vancomycin (VANCOCIN) IVPB 1000 mg/200 mL premix  Status:  Discontinued     1,000 mg 200 mL/hr over 60 Minutes Intravenous Every 12 hours 06/22/15 1346 06/23/15 0740   06/20/15 0200  vancomycin (VANCOCIN) 1,250 mg in sodium chloride 0.9 % 250 mL IVPB  Status:  Discontinued     1,250 mg 166.7 mL/hr over 90 Minutes Intravenous Every 12 hours 06/19/15 1314 06/22/15 1346   06/19/15 1400  vancomycin (VANCOCIN) 2,000 mg in sodium chloride 0.9 % 500 mL IVPB     2,000 mg 250 mL/hr over 120 Minutes Intravenous  Once 06/19/15 1313 06/19/15 1737   06/19/15 1315  piperacillin-tazobactam (ZOSYN) IVPB 3.375 g  Status:  Discontinued     3.375 g 12.5 mL/hr over 240 Minutes Intravenous Every 8 hours 06/19/15 1305 06/23/15 0740   06/19/15 1100  cefTRIAXone (ROCEPHIN) 2 g in dextrose 5 % 50 mL IVPB  Status:  Discontinued     2 g 100 mL/hr over 30 Minutes Intravenous  Every 24 hours 06/19/15 1020 06/19/15 1246   06/12/15 0930  cefoTEtan (CEFOTAN) 2 g in dextrose 5 % 50 mL IVPB     2 g 100 mL/hr over 30 Minutes Intravenous On call to O.R. 06/12/15 0852 06/12/15 1359       Assessment/Plan POD#20 exlap with SBR for strangulated bowel and internal hernia---Dr. [name] 06/12/15 -advance to soft diet and add resource breeze for supplementation -mobilize with PT -surgically stable Acute respiratory failure- -s/p respiratory arrest  -per CCM, appreciate their assistance ID-off abx DM II-CBGs, SSI CV-PEA arrest [id number], ST, diastolic heart failure. Diuresing, Appreciate cards assistance.  Stage 4 prostate cancer -appreciate Dr. Hazeline Junker follow up FEN-diuresing VTE prophylaxis-SCD/heparin.     Transfer to tele floor today  LOS: 21 days    Sylvana Bonk E 07/02/2015, 7:47 AM Pager: [phone]

## 2015-07-02 NOTE — Progress Notes (Signed)
Physical Therapy Treatment Patient Details Name: Jeremy Jennings MRN: [id number] DOB: Mar 24, 1936 Today's Date: 07/02/2015    History of Present Illness 79 yo black male who has a history of stage 4 prostate cancer, HTN, DM, tachycardia, recent GI bleed.  Pt admitted 12/1 with centralized crampy abdominal pain.  Due to persistent pain, the patient presented to [facility] for further evaluation.Dx of SBO, s/p resection 06/12/15.  High Temp 06/21/15, to SDU. Acute respiratoiry failure and cardiac arrest during hospital stay. Intubated 12/13. Extubated 12/14    PT Comments    Pt continues to require +2 assist for bed mobility and standing. Bil LE weakness (especially hips) noted. Discussed d/c plan with daughter-open to ST rehab at [facility]. Do not feel pt would be able to manage at home alone. Continue to recommend SNF for rehab at this time.   Follow Up Recommendations  SNF;Supervision/Assistance - 24 hour     Equipment Recommendations  None recommended by PT    Recommendations for Other Services       Precautions / Restrictions Precautions Precautions: Fall Precaution Comments: abd surgery Restrictions Weight Bearing Restrictions: No    Mobility  Bed Mobility Overal bed mobility: Needs Assistance Bed Mobility: Sit to Sidelying;Supine to Sit     Supine to sit: Mod assist;+2 for physical assistance;+2 for safety/equipment   Sit to sidelying: Mod assist General bed mobility comments: Assist for trunk and bil LEs. Increased time. Pt telling family to help pull him up on supine to sit. Educated pt on sidelying, rolling on sit to supine. Will need continued practice with this.   Transfers Overall transfer level: Needs assistance Equipment used: Rolling walker (2 wheeled) Transfers: Sit to/from Stand Sit to Stand: From elevated surface;Max assist         General transfer comment: Assist to rise, stabilize, control descent. Again, pt insists on family pulling him up despite  encouragement to perform as much of task unassisted as possible  Ambulation/Gait Ambulation/Gait assistance: Min assist Ambulation Distance (Feet): 350 Feet Assistive device: Rolling walker (2 wheeled) Gait Pattern/deviations: Step-through pattern;Decreased stride length     General Gait Details: assist to stabilize intermittently. Cues for safety. Dyspnea 2/4.    Stairs            Wheelchair Mobility    Modified Rankin (Stroke Patients Only)       Balance           Standing balance support: Bilateral upper extremity supported;During functional activity Standing balance-Leahy Scale: Poor                      Cognition Arousal/Alertness: Awake/alert (sleepy) Behavior During Therapy: WFL for tasks assessed/performed Overall Cognitive Status: Within Functional Limits for tasks assessed                      Exercises      General Comments        Pertinent Vitals/Pain Pain Assessment: 0-10 Pain Score: 8  Pain Location: abdomen Pain Descriptors / Indicators: Sore Pain Intervention(s): Monitored during session;Repositioned    Home Living                      Prior Function            PT Goals (current goals can now be found in the care plan section) Progress towards PT goals: Progressing toward goals (slowly)    Frequency  Min 3X/week    PT Plan Current  plan remains appropriate    Co-evaluation             End of Session   Activity Tolerance: Patient limited by fatigue;Patient limited by pain Patient left: in bed;with call bell/phone within reach;with family/visitor present;with bed alarm set     Time: 1422-1447 PT Time Calculation (min) (ACUTE ONLY): 25 min  Charges:  $Gait Training: 8-22 mins $Therapeutic Activity: 8-22 mins                    G Codes:      Weston Anna, MPT Pager: [phone]

## 2015-07-03 ENCOUNTER — Telehealth: Payer: Self-pay | Admitting: Cardiology

## 2015-07-03 LAB — GLUCOSE, CAPILLARY
GLUCOSE-CAPILLARY: 69 mg/dL (ref 65–99)
GLUCOSE-CAPILLARY: 74 mg/dL (ref 65–99)
GLUCOSE-CAPILLARY: 101 mg/dL — AB (ref 65–99)
Glucose-Capillary: 69 mg/dL (ref 65–99)
Glucose-Capillary: 95 mg/dL (ref 65–99)
Glucose-Capillary: 97 mg/dL (ref 65–99)

## 2015-07-03 MED ORDER — ONDANSETRON HCL 4 MG PO TABS: 4.0000 mg | ORAL_TABLET | Freq: Three times a day (TID) | ORAL | Status: DC | PRN

## 2015-07-03 MED ORDER — OXYCODONE-ACETAMINOPHEN 5-325 MG PO TABS: 1.0000 | ORAL_TABLET | ORAL | Status: DC | PRN

## 2015-07-03 MED ORDER — BOOST PLUS PO LIQD: 237.0000 mL | Freq: Two times a day (BID) | ORAL | Status: DC

## 2015-07-03 NOTE — Progress Notes (Signed)
Pt being discharged today, d/w PCCM, please refer to last note on 07/02/2015. Nothing further to add.   [name], MD  [facility] Pager [phone]  If 7PM-7AM, please contact night-coverage [email or web address] Password [email or web address]

## 2015-07-03 NOTE — Clinical Social Work Placement (Signed)
   CLINICAL SOCIAL WORK PLACEMENT  NOTE  Date:  07/03/2015  Patient Details  Name: NASIIR SCALA MRN: [id number] Date of Birth: [birth year]  Clinical Social Work is seeking post-discharge placement for this patient at the [place] level of care (*CSW will initial, date and re-position this form in  chart as items are completed):  Yes   Patient/family provided with [place] Work Department's list of facilities offering this level of care within the geographic area requested by the patient (or if unable, by the patient's family).  Yes   Patient/family informed of their freedom to choose among providers that offer the needed level of care, that participate in Medicare, Medicaid or managed care program needed by the patient, have an available bed and are willing to accept the patient.  Yes   Patient/family informed of [facility]'s ownership interest in [facility] and [facility], as well as of the fact that they are under no obligation to receive care at these facilities.  PASRR submitted to EDS on 07/03/15     PASRR number received on 07/03/15     Existing PASRR number confirmed on       FL2 transmitted to all facilities in geographic area requested by pt/family on 07/03/15     FL2 transmitted to all facilities within larger geographic area on       Patient informed that his/her managed care company has contracts with or will negotiate with certain facilities, including the following:        Yes   Patient/family informed of bed offers received.  Patient chooses bed at [facility]     Physician recommends and patient chooses bed at      Patient to be transferred to [facility] on 07/03/15.  Patient to be transferred to facility by pt daughter via private vehicle     Patient family notified on 07/03/15 of transfer.  Name of family member notified:  pt and pt daughter, [name] notified at bedside      PHYSICIAN Please sign FL2     Additional Comment:    _______________________________________________ Ladell Pier, LCSW 07/03/2015, 2:59 PM

## 2015-07-03 NOTE — Progress Notes (Signed)
Subjective: Did not feel significantly SOB with ambulation no chest pain Plan to d/c to [facility] for rehab   Objective: Vital signs in last 24 hours: Temp:  [97.4 F (36.3 C)-100.7 F (38.2 C)] 97.9 F (36.6 C) (12/23 0540) Pulse Rate:  [101-109] 101 (12/23 0540) Resp:  [20] 20 (12/23 0540) BP: (122-134)/(50-98) 134/61 mmHg (12/23 0540) SpO2:  [99 %-100 %] 100 % (12/23 0540) Weight:  [197 lb 8 oz (89.585 kg)] 197 lb 8 oz (89.585 kg) (12/23 0540) Weight change:  Last BM Date: 07/03/15 Intake/Output from previous day: +605 12/22 0701 - 12/23 0700 In: 1005 [P.O.:600; I.V.:300; IV Piggyback:105] Out: 400 [Urine:400] Intake/Output this shift: Total I/O In: 216 [P.O.:120; I.V.:96] Out: -   PE: General:Pleasant affect, NAD Skin:Warm and dry, brisk capillary refill HEENT:normocephalic, sclera clear, mucus membranes moist Heart:S1S2 RRR without murmur, gallup, rub or click Lungs:clear without rales, rhonchi, or wheezes [id number], non tender, + BS, do not palpate liver spleen or masses Ext:1-2+ lower ext edema, 2+ pedal pulses, 2+ radial pulses Neuro:alert and oriented, MAE, follows commands, + facial symmetry Tele  SR to ST HR pk 100-105 but improved   Lab Results:  Recent Labs  07/01/15 0345 07/02/15 0500  WBC 6.7 8.6  HGB 7.1* 7.0*  HCT 22.3* 21.5*  PLT 226 212   BMET  Recent Labs  07/01/15 0345 07/02/15 0500  NA 141 141  K 3.3* 3.7  CL 108 108  CO2 25 23  GLUCOSE 99 82  BUN 20 17  CREATININE 0.94 0.98  CALCIUM 6.4* 6.3*   No results for input(s): TROPONINI in the last 72 hours.  Invalid input(s): CK, MB  Lab Results  Component Value Date   CHOL 167 12/10/2014   HDL 37.50* 12/10/2014   LDLCALC 108* 12/10/2014   TRIG 161* 06/29/2015   CHOLHDL 4 12/10/2014   Lab Results  Component Value Date   HGBA1C 6.3* 05/28/2015     Lab Results  Component Value Date   TSH 2.837 05/28/2015    Hepatic Function Panel  Recent Labs   07/01/15 0345  PROT 5.0*  ALBUMIN 1.7*  AST 38  ALT 28  ALKPHOS 319*  BILITOT 0.5  BILIDIR <0.1*  IBILI NOT CALCULATED   No results for input(s): CHOL in the last 72 hours. No results for input(s): PROTIME in the last 72 hours.     Studies/Results: Dg Chest 1 View  07/02/2015  CLINICAL DATA:  Three weeks post bowel resection an exploratory laparotomy. Fever. EXAM: CHEST 1 VIEW COMPARISON:  06/30/2015 FINDINGS: Interval removal of enteric tube and left IJ central venous catheter. Right-sided PICC line unchanged. Lungs are somewhat hypoinflated with bibasilar opacification likely atelectasis although cannot exclude infection. Cardiomediastinal silhouette and remainder of the exam is unchanged. IMPRESSION: Hypoinflation with bibasilar opacification likely atelectasis although cannot exclude infection. Electronically Signed   By: Marin Olp M.D.   On: 07/02/2015 12:51    Medications: I have reviewed the patient's current medications. Scheduled Meds: . antiseptic oral rinse  7 mL Mouth Rinse q12n4p  . calcium citrate  200 mg of elemental calcium Oral BID  . carvedilol  12.5 mg Oral BID WC  . chlorhexidine  15 mL Mouth Rinse BID  . famotidine (PEPCID) IV  20 mg Intravenous Q24H  . furosemide  20 mg Oral Daily  . heparin subcutaneous  5,000 Units Subcutaneous 3 times per day  . hydrALAZINE  25 mg Oral 3 times per day  .  insulin aspart  0-20 Units Subcutaneous Q4H  . insulin glargine  10 Units Subcutaneous QHS  . lactose free nutrition  237 mL Oral BID BM  . lip balm  1 application Topical BID  . ondansetron (ZOFRAN) IV  4 mg Intravenous 6 times per day  . potassium & sodium phosphates  1 packet Oral BID WC  . prednisoLONE acetate  1 drop Both Eyes BID   Continuous Infusions: . dextrose 10 mL/hr at 06/30/15 2000   PRN Meds:.bisacodyl, fentaNYL (SUBLIMAZE) injection, magic mouthwash, methocarbamol (ROBAXIN)  IV, sodium chloride  Assessment/Plan: 44M with stage 4 prostate CA,  HTN, DM, recent GIB, chronic-appearing sinus tach (with PACs) admitted with SBO. S/p ex-lap/LOA [id number], initially complicated by post-op ileus, fever, enterococcal UTI, sinus tachycardia. LE duplex/CTA 12/9 neg for DVT/PE. 2D Echo 06/19/15: mod LVH, mid LV cavity systolic obliteration with increased mid-cavitary gradient, EF 65%, PASP 34. Developed ventilator dependent respiratory failure on 06/23/15 a/w bradycardic to PEA arrest - time to ROSC 6 minutes, received 2 amps of epinephrine - felt to be primary respiratory event. Has also had acute encephalopathy with periodic confusion this admission. Cardiology following for acute on chronic diastolic CHF and sinus tach.  1. Acute on chronic diastolic CHF with hypertensive heart disease - inpatient weights are extremely variable, now at 198/196 which is the lowest weight for quite some time. CXR still with mild CHF-no acute changes 12/20. Suspect component of his edema is due to severe hypoalbuminemia. Will review plan for diuretic with MD (currently on Lasix 20mg  po daily with stable renal function) +605 last 24 hours. But since admit +3095 -echo with hyperdynamic LV function but less cavity obliteration- EF 65-70% + LVH --lower ext edema neg venous doppler edema still present more today but up in chair more -? Increase lasix to 40 mg daily.   2. Chronic sinus tachycardia with PACs - on higher-dose diltiazem at home (360mg  daily) now stopped for concern for ileus-- now on coreg 12.5 BID and HR improved  3. Essential HTN - now on coreg 12.5 BID and hydralazine 25 mg TID   BP 134/61  4. PEA arrest 06/23/15 - felt to be primary resp event. Troponin after event was negative. Maintaining SR-ST  5. Chronic anemia - baseline Hgb appears 7-9. Today lower at 7.0 may benefit from transfusion   6. Hypokalemia/hypocalcemia - lytes being managed by primary team. Continues with hypokalemia- no BMP today   7. Nutrition per primary team-TPN- stopping now pt  eating   8. S/P EXPLORATORY LAPAROTOMY (N/A) SMALL BOWEL RESECTION improving POD 20  9. Low grade fever, check CXR.- improved fever, see CXR poss atelecatsis      LOS: 22 days   Time spent with pt. :15 minutes. [facility]  Nurse Practitioner Certified Pager [phone] or after 5pm and on weekends call [phone] 07/03/2015, 10:54 AM  The patient feels well today.  He is hopeful for discharged to [facility].  Blood pressure and heart rate are stable on current medication.  He still has some peripheral edema felt to be largely secondary to low serum proteins.  Lungs are clear to auscultation.  Will continue current furosemide 20 mg by mouth daily at discharge.

## 2015-07-03 NOTE — Telephone Encounter (Signed)
TCM per Mickel Baas   12/30 @ 1130 w/ Lisbeth Renshaw

## 2015-07-03 NOTE — Progress Notes (Deleted)
Report called to Kim, RN at [facility] at 1200. Patient stable, resting comfortably. Family at bedside. Awaiting transport at this time. 

## 2015-07-03 NOTE — Progress Notes (Signed)
Inpatient Diabetes Program Recommendations  AACE/ADA: New Consensus Statement on Inpatient Glycemic Control (2015)  Target Ranges:  Prepandial:   less than 140 mg/dL      Peak postprandial:   less than 180 mg/dL (1-2 hours)      Critically ill patients:  140 - 180 mg/dL    Results for ROMANO, GILPATRICK (MRN [id number]) as of 07/03/2015 09:42  Ref. Range 07/03/2015 00:07 07/03/2015 04:07 07/03/2015 04:36 07/03/2015 07:41 07/03/2015 08:57  Glucose-Capillary Latest Ref Range: 65-99 mg/dL 95 69 74 69 97    Home DM Meds: Levemir 12 units QHS        Novolog 12 units tidwc        Actos 30 mg daily        Victoza 1.8 mg daily  Current Insulin Orders: Lantus 10 units QHS      Novolog Resistant SSI (0-20 units) Q4 hours     -Couple of Mild Hypoglycemic events noted this AM.  -Note TPN was discontinued on the evening of 12/21.     MD- Please consider the following in-hospital insulin adjustments:  1. Reduce Lantus to 8 units QHS  2. Reduce Novolog SSI to Moderate scale (0-15 units) Q4 hours [currently ordered as Resistant scale Q4 hours)     --Will follow patient during hospitalization--  Wyn Quaker RN, MSN, CDE Diabetes Coordinator Inpatient Glycemic Control Team Team Pager: [phone] (8a-5p)

## 2015-07-03 NOTE — Clinical Social Work Placement (Signed)
   CLINICAL SOCIAL WORK PLACEMENT  NOTE  Date:  07/03/2015  Patient Details  Name: Jeremy Jennings MRN: [id number] Date of Birth: 11-03-1935  Clinical Social Work is seeking post-discharge placement for this patient at the [place] level of care (*CSW will initial, date and re-position this form in  chart as items are completed):  Yes   Patient/family provided with [place] Work Department's list of facilities offering this level of care within the geographic area requested by the patient (or if unable, by the patient's family).  Yes   Patient/family informed of their freedom to choose among providers that offer the needed level of care, that participate in Medicare, Medicaid or managed care program needed by the patient, have an available bed and are willing to accept the patient.  Yes   Patient/family informed of [facility]'s ownership interest in [facility] and [facility], as well as of the fact that they are under no obligation to receive care at these facilities.  PASRR submitted to EDS on 07/03/15     PASRR number received on 07/03/15     Existing PASRR number confirmed on       FL2 transmitted to all facilities in geographic area requested by pt/family on 07/03/15     FL2 transmitted to all facilities within larger geographic area on       Patient informed that his/her managed care company has contracts with or will negotiate with certain facilities, including the following:        Yes   Patient/family informed of bed offers received.  Patient chooses bed at [facility]     Physician recommends and patient chooses bed at      Patient to be transferred to [facility] on  .  Patient to be transferred to facility by       Patient family notified on   of transfer.  Name of family member notified:        PHYSICIAN Please sign FL2     Additional Comment:     _______________________________________________ Ladell Pier, LCSW 07/03/2015, 12:34 PM

## 2015-07-03 NOTE — Discharge Instructions (Signed)
[facility]      [facility] [phone]  OPEN ABDOMINAL SURGERY: POST OP INSTRUCTIONS  Always review your discharge instruction sheet given to you by the facility where your surgery was performed.  IF YOU HAVE DISABILITY OR FAMILY LEAVE FORMS, YOU MUST BRING THEM TO THE OFFICE FOR PROCESSING.  PLEASE DO NOT GIVE THEM TO YOUR DOCTOR.  1. A prescription for pain medication may be given to you upon discharge.  Take your pain medication as prescribed, if needed.  If narcotic pain medicine is not needed, then you may take acetaminophen (Tylenol) or ibuprofen (Advil) as needed. 2. Take your usually prescribed medications unless otherwise directed. 3. If you need a refill on your pain medication, please contact your pharmacy. They will contact our office to request authorization.  Prescriptions will not be filled after 5pm or on week-ends. 4. You should follow a light diet the first few days after arrival home, such as soup and crackers, pudding, etc.unless your doctor has advised otherwise. A high-fiber, low fat diet can be resumed as tolerated.   Be sure to include lots of fluids daily. Most patients will experience some swelling and bruising on the chest and neck area.  Ice packs will help.  Swelling and bruising can take several days to resolve 5. Most patients will experience some swelling and bruising in the area of the incision. Ice pack will help. Swelling and bruising can take several days to resolve..  6. It is common to experience some constipation if taking pain medication after surgery.  Increasing fluid intake and taking a stool softener will usually help or prevent this problem from occurring.  A mild laxative (Milk of Magnesia or Miralax) should be taken according to package directions if there are no bowel movements after 48 hours. 7.  You may have steri-strips (small skin tapes) in place directly over the incision.  These strips should be left on the skin for 7-10 days.  If your  surgeon used skin glue on the incision, you may shower in 24 hours.  The glue will flake off over the next 2-3 weeks.  Any sutures or staples will be removed at the office during your follow-up visit. You may find that a light gauze bandage over your incision may keep your staples from being rubbed or pulled. You may shower and replace the bandage daily. 8. ACTIVITIES:  You may resume regular (light) daily activities beginning the next day--such as daily self-care, walking, climbing stairs--gradually increasing activities as tolerated.  You may have sexual intercourse when it is comfortable.  Refrain from any heavy lifting or straining until approved by your doctor. a. You may drive when you no longer are taking prescription pain medication, you can comfortably wear a seatbelt, and you can safely maneuver your car and apply brakes b. Return to Work: ___________________________________ 9. You should see your doctor in the office for a follow-up appointment approximately two weeks after your surgery.  Make sure that you call for this appointment within a day or two after you arrive home to insure a convenient appointment time. OTHER INSTRUCTIONS:  _____________________________________________________________ _____________________________________________________________  WHEN TO CALL YOUR DOCTOR: 1. Fever over 101.0 2. Inability to urinate 3. Nausea and/or vomiting 4. Extreme swelling or bruising 5. Continued bleeding from incision. 6. Increased pain, redness, or drainage from the incision. 7. Difficulty swallowing or breathing 8. Muscle cramping or spasms. 9. Numbness or tingling in hands or feet or around lips.  The clinic staff is available to   answer your questions during regular business hours.  Please don't hesitate to call and ask to speak to one of the nurses if you have concerns.  For further questions, please visit [email or web address]   

## 2015-07-03 NOTE — Discharge Summary (Signed)
Patient ID: JAMINE HIGHFILL MRN: [id number] DOB/AGE: March 20, 1936 79 y.o.  Admit date: 06/11/2015 Discharge date: 07/03/2015  Procedures: Exploratory laparotomy with Small bowel resection, Dr. [name], 06-12-15  Consults: cardiology, pulmonary/intensive care and internal medicine, oncology  Reason for Admission:  This is a very pleasant 79 yo black male who has a history of stage 4 prostate cancer, HTN, DM, tachycardia, recent GI bleed. He had an endoscopy at that time of admission but apparently deferred an inpatient colonoscopy. He just had his colonoscopy done by Dr. Watt Climes on Tuesday. By report, this was normal with no source of bleeding identified. He denies any further bloody BMs since this admission. This morning around 6 or 7 am, the patient began having centralized crampy abdominal pain. He denies nausea or vomiting, but admits to no flatus. He deals with constipation on a regular basis. Due to persistent pain, the patient presented to [facility] for further evaluation. His labs are essentially normal, no elevated WBC. Hgb is 9.0, lactic acid is normal. He does have mild tachycardia, which he takes diltiazem for. He had a CT scan which is suggestive a small bowel obstruction. It is read as a possible closed loop with surrounding mesenteric fluid which could indicate a perforation. We have been asked to evaluate the patient for admission.   Admission Diagnoses:  1. SBO 2. HTN/tachycardia 3. DM 4. Anemia 5. Stage 4 prostate cancer  Hospital Course:  1. SBO The patient was admitted and an NGT was placed.  The following day he had not improved and felt laparotomy was warranted.  He was taken to the OR where he was found to have an internal hernia with some compromised bowel.  A SBR was completed and he tolerated the procedure fairly well.  He had a long post op ileus and was started on TNA.  His NGT was able to be removed around POD 5, but due to sepsis from a UTI and possible PNA, he  remained NPO but was transferred to the SDU on POD 7.  His post op ileus began to improve and his diet was able to be advanced as tolerated.  On POD 11, he was advanced to a soft diet but then he had a PEA arrest and was intubated.  His TNA continued.  Once he was extubated, he developed another course of an ileus.  Once this resolved, his diet was once again able to be advanced as tolerated.  He began having good BMs and great bowel activity.  He did have some intermittent nausea which was controlled with zofran.  His staples were removed in the hospital as well and his wound was healing nice at time of discharge.  2. ARF Post operatively the patient developed a PNA.  He likely had a mucous plug and respiratory arrested requiring a few minutes of CPR on POD 11.  He was able to be extubated just 2 days later.  He remained stable respiratory wise throughout the rest of the hospitalization.  He did have some overall fluid overload and was diuresed with the assistance of the internal medicine team, CCM, and cardiology.  3. UTI The patient had a UTI with sepsis from this post operatively.  He was treated with abx therapy and this resolved.  4. Deconditioning He had deconditioning from a prolonged course in the hospital.  PT recommended SNF and he will go there at discharge.  5. DM Remained stable while inpatient  6. Tachycardia Cardiology was consulted and followed the patient along adjusting  his medications as needed.  Please see chart for more detailed description of changes made during his stay.  7. Stage 4 Prostate cancer No new changes to this while in the hospital.  Dr. Alen Blew graciously came by and saw the patient while here in the hospital and recommended holding all treatment for now while he recovered.  He will need to follow up as an outpatient for further discussions of how to proceed with his treatment.  The patient was surgically stable on POD 21 for DC to SNF,  [facility].   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 Take on an empty stomach 1 hour before or 2 hours after a meal     ACCU-CHEK AVIVA PLUS W/DEVICE Kit  Use to check blood sugar 2 times per day dx code E11.65     ACCU-CHEK SOFTCLIX LANCETS lancets  Use as instructed to check blood sugar 2 times per day dx code E11.65     acetaminophen 650 MG CR tablet  Commonly known as:  TYLENOL  Take 1 tablet (650 mg total) by mouth every 8 (eight) hours as needed for pain.     atorvastatin 10 MG tablet  Commonly known as:  LIPITOR  take 1 tablet by mouth once daily     carvedilol 12.5 MG tablet  Commonly known as:  COREG  Take 1 tablet (12.5 mg total) by mouth 2 (two) times daily with a meal.     COMBIGAN 0.2-0.5 % ophthalmic solution  Generic drug:  brimonidine-timolol  Place 1 drop into the right eye daily.     diltiazem 360 MG 24 hr capsule  Commonly known as:  TIAZAC  Take 360 mg by mouth daily.     docusate sodium 100 MG capsule  Commonly known as:  COLACE  Take 100 mg by mouth daily.     glucose blood test strip  Commonly known as:  ACCU-CHEK AVIVA PLUS  Use as instructed to check blood sugar 2 times per day dx code E11.65     guaiFENesin 100 MG/5ML liquid   Commonly known as:  ROBITUSSIN  Take 200 mg by mouth 3 (three) times daily as needed for cough.     insulin aspart 100 UNIT/ML FlexPen  Commonly known as:  NOVOLOG FLEXPEN  Inject 12 units 3 times per day with meals     Insulin Detemir 100 UNIT/ML Pen  Commonly known as:  LEVEMIR  Inject 12 Units into the skin daily at 10 pm.     Insulin Pen Needle 31G X 5 MM Misc  Commonly known as:  B-D UF III MINI PEN NEEDLES  1 each by Does not apply route 2 (two) times daily.     lactose free nutrition  Liqd  Take 237 mLs by mouth 2 (two) times daily between meals.     Liraglutide 18 MG/3ML Sopn  Inject 1.8 mg into the skin daily.     megestrol 400 MG/10ML suspension  Commonly known as:  MEGACE  Take 10 mLs (400 mg total) by mouth 2 (two) times daily.     ondansetron 4 MG tablet  Commonly known as:  ZOFRAN  Take 1 tablet (4 mg total) by mouth every 8 (eight) hours as needed for nausea or vomiting.     oxyCODONE-acetaminophen 5-325 MG tablet  Commonly known as:  PERCOCET/ROXICET  Take 1-2 tablets by mouth every 4 (four) hours as needed for moderate pain or severe pain.     pioglitazone 30 MG tablet  Commonly known as:  ACTOS  Take 1 tablet (30 mg total) by mouth daily.     prednisoLONE acetate 1 % ophthalmic suspension  Commonly known as:  PRED FORTE  Place 1 drop into both eyes 2 (two) times daily.     tamsulosin 0.4 MG Caps capsule  Commonly known as:  FLOMAX  Take 0.4 mg by mouth daily.     VITAMIN B-12 PO  Take 1 tablet by mouth daily.     VITAMIN D PO  Take 5,000 Units by mouth daily. Taking 5000 daily        Discharge Instructions:     Follow-up Information    Follow up with [name], MD In 2 weeks.   Specialty:  Cardiology   Contact information:   [address] [place] [address] [phone]       Follow up with Edward Jolly, MD On 07/30/2015.   Specialty:  General Surgery   Why:  9:45am, arrive by 9:15am for paperwork    Contact information:   [place]  [id number] [phone]       Follow up with [facility], MD In 2 weeks.   Specialty:  Oncology   Contact information:   [address] [place] [address] [phone]       Signed: Henreitta Cea 07/03/2015, 11:06 AM

## 2015-07-03 NOTE — Progress Notes (Signed)
Pt for discharge to [facility].  CSW facilitated pt discharge needs including contacting facility, faxing pt discharge information via [place], discussing with pt and pt daughter at bedside, providing RN phone number to call report, and providing discharge packet to pt daughter to provide to [facility] upon arrival. Pt daughter plans to transport pt via private vehicle to [facility].  No further social work needs identified at this time.  CSW signing off.   Alison Murray, MSW, [place] [facility] [phone]

## 2015-07-03 NOTE — Progress Notes (Signed)
0400 CBG 69, gave orange juice to drink. Rechecked after 15 minutes, CBG 74. Will continue to monitor.

## 2015-07-03 NOTE — Progress Notes (Signed)
Patient ID: Jeremy Jennings, male   DOB: June 23, 1936, 79 y.o.   MRN: [id number] 21 Days Post-Op  Subjective: Pt doing ok, some nausea.  Getting scheduled zofran.  Still moving his bowels.  Some low CBGs this am.  Objective: Vital signs in last 24 hours: Temp:  [97.4 F (36.3 C)-100.7 F (38.2 C)] 97.9 F (36.6 C) (12/23 0540) Pulse Rate:  [101-109] 101 (12/23 0540) Resp:  [20] 20 (12/23 0540) BP: (122-134)/(50-98) 134/61 mmHg (12/23 0540) SpO2:  [99 %-100 %] 100 % (12/23 0540) Weight:  [89.585 kg (197 lb 8 oz)] 89.585 kg (197 lb 8 oz) (12/23 0540) Last BM Date: 07/03/15  Intake/Output from previous day: 12/22 0701 - 12/23 0700 In: 1005 [P.O.:600; I.V.:300; IV Piggyback:105] Out: 400 [Urine:400] Intake/Output this shift: Total I/O In: 206 [P.O.:120; I.V.:86] Out: -   PE: Abd: soft, minimally tender, midline incision healing well, +BS Heart: regular Lungs: CTAB  Lab Results:   Recent Labs  07/01/15 0345 07/02/15 0500  WBC 6.7 8.6  HGB 7.1* 7.0*  HCT 22.3* 21.5*  PLT 226 212   BMET  Recent Labs  07/01/15 0345 07/02/15 0500  NA 141 141  K 3.3* 3.7  CL 108 108  CO2 25 23  GLUCOSE 99 82  BUN 20 17  CREATININE 0.94 0.98  CALCIUM 6.4* 6.3*   PT/INR No results for input(s): LABPROT, INR in the last 72 hours. CMP     Component Value Date/Time   NA 141 07/02/2015 0500   NA 142 05/18/2015 0838   K 3.7 07/02/2015 0500   K 3.5 05/18/2015 0838   CL 108 07/02/2015 0500   CO2 23 07/02/2015 0500   CO2 18* 05/18/2015 0838   GLUCOSE 82 07/02/2015 0500   GLUCOSE 84 05/18/2015 0838   BUN 17 07/02/2015 0500   BUN 18.3 05/18/2015 0838   CREATININE 0.98 07/02/2015 0500   CREATININE 0.8 05/18/2015 0838   CALCIUM 6.3* 07/02/2015 0500   CALCIUM 8.4 05/18/2015 0838   PROT 5.0* 07/01/2015 0345   PROT 6.5 05/18/2015 0838   ALBUMIN 1.7* 07/01/2015 0345   ALBUMIN 2.4* 05/18/2015 0838   AST 38 07/01/2015 0345   AST 18 05/18/2015 0838   ALT 28 07/01/2015 0345   ALT <9  05/18/2015 0838   ALKPHOS 319* 07/01/2015 0345   ALKPHOS 488* 05/18/2015 0838   BILITOT 0.5 07/01/2015 0345   BILITOT 0.33 05/18/2015 0838   GFRNONAA >60 07/02/2015 0500   GFRAA >60 07/02/2015 0500   Lipase     Component Value Date/Time   LIPASE 46 11/09/2014 1137       Studies/Results: Dg Chest 1 View  07/02/2015  CLINICAL DATA:  Three weeks post bowel resection an exploratory laparotomy. Fever. EXAM: CHEST 1 VIEW COMPARISON:  06/30/2015 FINDINGS: Interval removal of enteric tube and left IJ central venous catheter. Right-sided PICC line unchanged. Lungs are somewhat hypoinflated with bibasilar opacification likely atelectasis although cannot exclude infection. Cardiomediastinal silhouette and remainder of the exam is unchanged. IMPRESSION: Hypoinflation with bibasilar opacification likely atelectasis although cannot exclude infection. Electronically Signed   By: Marin Olp M.D.   On: 07/02/2015 12:51    Anti-infectives: Anti-infectives    Start     Dose/Rate Route Frequency Ordered Stop   06/23/15 1700  vancomycin (VANCOCIN) IVPB 1000 mg/200 mL premix  Status:  Discontinued     1,000 mg 200 mL/hr over 60 Minutes Intravenous Every 12 hours 06/23/15 1414 06/26/15 1000   06/23/15 1430  piperacillin-tazobactam (ZOSYN) IVPB 3.375  g     3.375 g 12.5 mL/hr over 240 Minutes Intravenous Every 8 hours 06/23/15 1416 06/30/15 0227   06/23/15 1415  piperacillin-tazo (ZOSYN) NICU IV syringe 200 mg/mL  Status:  Discontinued     75 mg/kg  105.4 kg 79 mL/hr over 30 Minutes Intravenous Every 8 hours 06/23/15 1401 06/23/15 1416   06/23/15 1000  levofloxacin (LEVAQUIN) tablet 750 mg  Status:  Discontinued     750 mg Oral Daily 06/23/15 0750 06/23/15 1401   06/22/15 1600  vancomycin (VANCOCIN) IVPB 1000 mg/200 mL premix  Status:  Discontinued     1,000 mg 200 mL/hr over 60 Minutes Intravenous Every 12 hours 06/22/15 1346 06/23/15 0740   06/20/15 0200  vancomycin (VANCOCIN) 1,250 mg in sodium  chloride 0.9 % 250 mL IVPB  Status:  Discontinued     1,250 mg 166.7 mL/hr over 90 Minutes Intravenous Every 12 hours 06/19/15 1314 06/22/15 1346   06/19/15 1400  vancomycin (VANCOCIN) 2,000 mg in sodium chloride 0.9 % 500 mL IVPB     2,000 mg 250 mL/hr over 120 Minutes Intravenous  Once 06/19/15 1313 06/19/15 1737   06/19/15 1315  piperacillin-tazobactam (ZOSYN) IVPB 3.375 g  Status:  Discontinued     3.375 g 12.5 mL/hr over 240 Minutes Intravenous Every 8 hours 06/19/15 1305 06/23/15 0740   06/19/15 1100  cefTRIAXone (ROCEPHIN) 2 g in dextrose 5 % 50 mL IVPB  Status:  Discontinued     2 g 100 mL/hr over 30 Minutes Intravenous Every 24 hours 06/19/15 1020 06/19/15 1246   06/12/15 0930  cefoTEtan (CEFOTAN) 2 g in dextrose 5 % 50 mL IVPB     2 g 100 mL/hr over 30 Minutes Intravenous On call to O.R. 06/12/15 0852 06/12/15 1359       Assessment/Plan POD#21 exlap with SBR for strangulated bowel and internal hernia---Dr. [name] 06/12/15 -soft diet and breeze -cont scheduled zofran to help with nausea.  He is moving his bowels well and has good bowel function -mobilize with PT -recommending SNF, SW consult today for placement -surgically stable Acute respiratory failure- -s/p respiratory arrest  -per CCM, appreciate their assistance, have consulted medicine for assistance ID-off abx DM II-SSI and lantus, CBGs have been low.  Have asked medicine to see to assist with this CV-PEA arrest [id number], ST, diastolic heart failure. Diuresing, Appreciate cards assistance.  Stage 4 prostate cancer -appreciate Dr. Hazeline Junker follow up [place] VTE prophylaxis-SCD/heparin   dispo - SW consult for placement  LOS: 22 days    Jeremy Jennings 07/03/2015, 9:02 AM Pager: [phone]

## 2015-07-03 NOTE — Clinical Social Work Note (Signed)
Clinical Social Work Assessment  Patient Details  Name: Jeremy Jennings MRN: [id number] Date of Birth: 1935/07/26  Date of referral:  07/03/15               Reason for consult:  Discharge Planning                Permission sought to share information with:  Family Supports Permission granted to share information::  Yes, Verbal Permission Granted  Name::     Jeremy Jennings  Agency::     Relationship::  daughter  Contact Information:  [phone]  Housing/Transportation Living arrangements for the past 2 months:  [place] of Information:  Patient, Adult Children Patient Interpreter Needed:  None Criminal Activity/Legal Involvement Pertinent to Current Situation/Hospitalization:  No - Comment as needed Significant Relationships:  Adult Children Lives with:  Self Do you feel safe going back to the place where you live?  No Need for family participation in patient care:  Yes (Comment)  Care giving concerns:  Pt referred for short term rehab at [facility]. Pt and pt daughter agreeable to plan.   Social Worker assessment / plan:  CSW received referral for New SNF.  CSW met with pt and pt daughter, [name] at bedside to discuss disposition planning. Pt admitted from home and has had extensive inpatient admission. CSW discussed recommendation for short term SNF. Pt and pt daughter agreeable. Pt and pt daughter discussed that [facility] would be first choice and then [facility] would be second choice. CSW discussed process of SNF search and pt and pt daughter agreeable.   CSW completed FL2 and initiated SNF search to [facility]. CSW contacted [facility] and facility stated that facility is currently full. CSW contacted [facility] and facility reviewed information and offered pt placement and bed available today.  CSW spoke with [facility] PA and medical MD and both feel pt stable for discharge today as long as pt and pt daughter feel comfortable with  plan.  CSW met with pt and pt daughter to notify of [facility] having private room available and discussed the other options. Pt and pt daughter are agreeable to [facility] and agreeable to discharge today.  CSW notified [facility] in order for discharge information to be completed.  CSW to continue to follow and facilitate pt discharge needs this afternoon.  Employment status:  Retired Nurse, adult PT Recommendations:  [place] / Referral to community resources:  [place]  Patient/Family's Response to care:  Pt alert and oriented x 4. Pt daughter actively involved and supportive. Pt and pt daughter agreeable to plan and pt eager to discharge from the hospital.  Patient/Family's Understanding of and Emotional Response to Diagnosis, Current Treatment, and Prognosis:  Pt and pt daughter displayed knowledge surrounding pt diagnosis and treatment plan. Pt and pt daughter agreeable to [place] today.  Emotional Assessment Appearance:  Appears stated age Attitude/Demeanor/Rapport:  Other (pt appropriate) Affect (typically observed):  Appropriate Orientation:  Oriented to Self, Oriented to Place, Oriented to  Time, Oriented to Situation Alcohol / Substance use:  Not Applicable Psych involvement (Current and /or in the community):  No (Comment)  Discharge Needs  Concerns to be addressed:  Discharge Planning Concerns Readmission within the last 30 days:  No Current discharge risk:  Physical Impairment Barriers to Discharge:  No Barriers Identified   [place], [place], LCSW 07/03/2015, 3:34 PM  [phone]

## 2015-07-03 NOTE — NC FL2 (Signed)
[place] LEVEL OF CARE SCREENING TOOL     IDENTIFICATION  Patient Name: Jeremy Jennings Birthdate: Dec 13, 1935 Sex: male Admission Date (Current Location): 06/11/2015  [facility] and [place] Number:  Herbalist and Address:  [facility],  [place] [address] [place]      Provider Number: [id number]  Attending Physician Name and Address:  Md Edison Pace, MD  Relative Name and Phone Number:       Current Level of Care: Hospital Recommended Level of Care: [place] Prior Approval Number:    Date Approved/Denied:   PASRR Number: [id number]  Discharge Plan: SNF    Current Diagnoses: Patient Active Problem List   Diagnosis Date Noted  . Aspiration of vomitus   . Cardiac arrest due to respiratory disorder ([place]) 06/24/2015  . 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 Acute diastolic CHF (congestive heart failure), NYHA class 4 ([place]) 06/23/2015  . Acute respiratory failure ([place])   . Enterococcus UTI   . HCAP (healthcare-associated pneumonia) 06/19/2015  . Sinus tachycardia ([place]) 06/19/2015  . Pedal edema 06/19/2015  . Generalized weakness 06/19/2015  . Metabolic acidosis 0000000  . Type 2 diabetes mellitus ([place]) 06/19/2015  . SBO (small bowel obstruction) ischemic s/p LOA & SB resection 06/12/2015 06/11/2015  . Acute GI bleeding 05/27/2015  . GI bleed 05/27/2015  . Low hemoglobin 12/10/2014  . Trigger finger of both hands 12/10/2014  . Anemia 11/09/2014  . Diabetes ([place]) 04/21/2014  . Hypotestosteronism 04/21/2014  . Cancer ([place]) 04/21/2014  . Prostate cancer ([place]) 11/20/2013  . Elevated PSA 08/29/2013  . Chronic kidney disease, stage II (mild) 08/08/2013  . BPH associated with nocturia 07/24/2013  . Benign hypertensive heart disease without heart failure 07/24/2013  . PAC (premature atrial contraction) 07/24/2012  . Bursitis of left shoulder 07/15/2011  . Hyperlipidemia   . Hypertension      Orientation RESPIRATION BLADDER Height & Weight    Self, Time, Situation, Place  Normal Incontinent, External catheter 6' (182.9 cm) 197 lbs.  BEHAVIORAL SYMPTOMS/MOOD NEUROLOGICAL BOWEL NUTRITION STATUS   (no behaviors)  (NONE) Incontinent Diet (Diet Soft)  AMBULATORY STATUS COMMUNICATION OF NEEDS Skin   Limited Assist (+2 for transfers) Verbally Surgical wounds (Incision (closed) 06/12/2015 abdomen )                       Personal Care Assistance Level of Assistance  Bathing, Feeding, Dressing Bathing Assistance: Limited assistance Feeding assistance: Independent Dressing Assistance: Limited assistance     Functional Limitations Info  Sight, Hearing, Speech Sight Info: Adequate Hearing Info: Adequate Speech Info: Adequate    SPECIAL CARE FACTORS FREQUENCY  PT (By licensed PT), OT (By licensed OT)     PT Frequency: 5 x a week OT Frequency: 5 x a week            Contractures Contractures Info: Not present    Additional Factors Info  Code Status, Allergies, Insulin Sliding Scale Code Status Info: FULL code status Allergies Info: No Known Allergies   Insulin Sliding Scale Info: 4 x a day       Current Medications (07/03/2015):  This is the current hospital active medication list Current Facility-Administered Medications  Medication Dose Route Frequency Provider Last Rate Last Dose  . antiseptic oral rinse (CPC / CETYLPYRIDINIUM CHLORIDE 0.05%) solution 7 mL  7 mL Mouth Rinse q12n4p Mauri [place], MD   7 mL at 07/02/15 1614  . bisacodyl (DULCOLAX) suppository  10 mg  10 mg Rectal Daily PRN Nat Christen, PA-C      . calcium citrate (CALCITRATE - dosed in mg elemental calcium) tablet 200 mg of elemental calcium  200 mg of elemental calcium Oral BID Praveen Mannam, MD   200 mg of elemental calcium at 07/02/15 2245  . carvedilol (COREG) tablet 12.5 mg  12.5 mg Oral BID WC Darlin Coco, MD   12.5 mg at 07/03/15 0858  . chlorhexidine (PERIDEX) 0.12 % solution  15 mL  15 mL Mouth Rinse BID Mauri [place], MD   15 mL at 07/02/15 2246  . dextrose 5 % solution   Intravenous Continuous Berton Mount, RPH 10 mL/hr at 06/30/15 2000    . famotidine (PEPCID) IVPB 20 mg premix  20 mg Intravenous Q24H Berton Mount, RPH   20 mg at 07/03/15 0858  . fentaNYL (SUBLIMAZE) injection 50 mcg  50 mcg Intravenous Q1H PRN Anders Simmonds, MD   50 mcg at 06/28/15 1525  . furosemide (LASIX) tablet 20 mg  20 mg Oral Daily Darlin Coco, MD   20 mg at 07/03/15 0857  . heparin injection 5,000 Units  5,000 Units Subcutaneous 3 times per day Earnstine Regal, PA-C   5,000 Units at 07/03/15 0541  . hydrALAZINE (APRESOLINE) tablet 25 mg  25 mg Oral 3 times per day Darlin Coco, MD   25 mg at 07/03/15 0542  . insulin aspart (novoLOG) injection 0-20 Units  0-20 Units Subcutaneous Q4H Elsie Stain, MD   3 Units at 07/01/15 1244  . insulin glargine (LANTUS) injection 10 Units  10 Units Subcutaneous QHS [name], MD   10 Units at 07/02/15 2246  . lactose free nutrition (BOOST PLUS) liquid 237 mL  237 mL Oral BID BM Maricela Bo [place], RD   237 mL at 07/03/15 0857  . lip balm (CARMEX) ointment 1 application  1 application Topical BID Michael Boston, MD   1 application at [id number] [phone]  . magic mouthwash  15 mL Oral QID PRN Michael [place], MD      . methocarbamol (ROBAXIN) 500 mg in dextrose 5 % 50 mL IVPB  500 mg Intravenous Q8H PRN Lavina Hamman, MD   500 mg at 07/02/15 1730  . ondansetron (ZOFRAN) injection 4 mg  4 mg Intravenous 6 times per day Saverio Danker, PA-C   4 mg at 07/03/15 0846  . potassium & sodium phosphates (PHOS-NAK) 280-160-250 MG packet 1 packet  1 packet Oral BID WC Marshell Garfinkel, MD   1 packet at 07/03/15 0859  . prednisoLONE acetate (PRED FORTE) 1 % ophthalmic suspension 1 drop  1 drop Both Eyes BID Saverio Danker, PA-C   1 drop at 07/03/15 [phone]  . sodium chloride 0.9 % injection 10-40 mL  10-40 mL Intracatheter PRN Earnstine Regal, PA-C   10 mL at  07/03/15 [id number]     Discharge Medications: Please see discharge summary for a list of discharge medications.  Relevant Imaging Results:  Relevant Lab Results:   Additional Information SSN: [id number]  Karlene Southard A, LCSW

## 2015-07-04 ENCOUNTER — Emergency Department (HOSPITAL_COMMUNITY): Payer: Medicare Other

## 2015-07-04 ENCOUNTER — Encounter (HOSPITAL_COMMUNITY): Payer: Self-pay | Admitting: Emergency Medicine

## 2015-07-04 ENCOUNTER — Inpatient Hospital Stay (HOSPITAL_COMMUNITY)
Admission: EM | Admit: 2015-07-04 | Discharge: 2015-07-17 | DRG: 871 | Disposition: A | Discharge status: Skilled Nursing Facility | Payer: Medicare Other | Attending: Internal Medicine | Admitting: Internal Medicine

## 2015-07-04 ENCOUNTER — Other Ambulatory Visit: Payer: Self-pay

## 2015-07-04 DIAGNOSIS — Z87891 Personal history of nicotine dependence: Secondary | ICD-10-CM

## 2015-07-04 DIAGNOSIS — E872 Acidosis: Secondary | ICD-10-CM | POA: Diagnosis present

## 2015-07-04 DIAGNOSIS — E861 Hypovolemia: Secondary | ICD-10-CM | POA: Diagnosis present

## 2015-07-04 DIAGNOSIS — Z452 Encounter for adjustment and management of vascular access device: Secondary | ICD-10-CM

## 2015-07-04 DIAGNOSIS — D649 Anemia, unspecified: Secondary | ICD-10-CM

## 2015-07-04 DIAGNOSIS — E11649 Type 2 diabetes mellitus with hypoglycemia without coma: Secondary | ICD-10-CM | POA: Diagnosis present

## 2015-07-04 DIAGNOSIS — R6521 Severe sepsis with septic shock: Secondary | ICD-10-CM | POA: Diagnosis present

## 2015-07-04 DIAGNOSIS — I959 Hypotension, unspecified: Secondary | ICD-10-CM | POA: Diagnosis present

## 2015-07-04 DIAGNOSIS — I119 Hypertensive heart disease without heart failure: Secondary | ICD-10-CM

## 2015-07-04 DIAGNOSIS — I5033 Acute on chronic diastolic (congestive) heart failure: Secondary | ICD-10-CM | POA: Diagnosis present

## 2015-07-04 DIAGNOSIS — E669 Obesity, unspecified: Secondary | ICD-10-CM | POA: Diagnosis present

## 2015-07-04 DIAGNOSIS — A0472 Enterocolitis due to Clostridium difficile, not specified as recurrent: Secondary | ICD-10-CM

## 2015-07-04 DIAGNOSIS — D500 Iron deficiency anemia secondary to blood loss (chronic): Secondary | ICD-10-CM | POA: Diagnosis present

## 2015-07-04 DIAGNOSIS — I10 Essential (primary) hypertension: Secondary | ICD-10-CM

## 2015-07-04 DIAGNOSIS — Z7952 Long term (current) use of systemic steroids: Secondary | ICD-10-CM

## 2015-07-04 DIAGNOSIS — E870 Hyperosmolality and hypernatremia: Secondary | ICD-10-CM | POA: Diagnosis not present

## 2015-07-04 DIAGNOSIS — R740 Nonspecific elevation of levels of transaminase and lactic acid dehydrogenase [LDH]: Secondary | ICD-10-CM | POA: Diagnosis present

## 2015-07-04 DIAGNOSIS — Z794 Long term (current) use of insulin: Secondary | ICD-10-CM

## 2015-07-04 DIAGNOSIS — E878 Other disorders of electrolyte and fluid balance, not elsewhere classified: Secondary | ICD-10-CM | POA: Diagnosis present

## 2015-07-04 DIAGNOSIS — I248 Other forms of acute ischemic heart disease: Secondary | ICD-10-CM | POA: Diagnosis present

## 2015-07-04 DIAGNOSIS — C61 Malignant neoplasm of prostate: Secondary | ICD-10-CM | POA: Diagnosis present

## 2015-07-04 DIAGNOSIS — J9811 Atelectasis: Secondary | ICD-10-CM

## 2015-07-04 DIAGNOSIS — A419 Sepsis, unspecified organism: Secondary | ICD-10-CM

## 2015-07-04 DIAGNOSIS — Z87440 Personal history of urinary (tract) infections: Secondary | ICD-10-CM

## 2015-07-04 DIAGNOSIS — E46 Unspecified protein-calorie malnutrition: Secondary | ICD-10-CM | POA: Diagnosis present

## 2015-07-04 DIAGNOSIS — IMO0002 Reserved for concepts with insufficient information to code with codable children: Secondary | ICD-10-CM

## 2015-07-04 DIAGNOSIS — I493 Ventricular premature depolarization: Secondary | ICD-10-CM | POA: Diagnosis present

## 2015-07-04 DIAGNOSIS — E876 Hypokalemia: Secondary | ICD-10-CM | POA: Diagnosis present

## 2015-07-04 DIAGNOSIS — E1165 Type 2 diabetes mellitus with hyperglycemia: Secondary | ICD-10-CM

## 2015-07-04 DIAGNOSIS — R509 Fever, unspecified: Secondary | ICD-10-CM | POA: Diagnosis not present

## 2015-07-04 DIAGNOSIS — Z6826 Body mass index (BMI) 26.0-26.9, adult: Secondary | ICD-10-CM

## 2015-07-04 DIAGNOSIS — E785 Hyperlipidemia, unspecified: Secondary | ICD-10-CM

## 2015-07-04 DIAGNOSIS — Z8674 Personal history of sudden cardiac arrest: Secondary | ICD-10-CM

## 2015-07-04 DIAGNOSIS — D638 Anemia in other chronic diseases classified elsewhere: Secondary | ICD-10-CM | POA: Diagnosis present

## 2015-07-04 DIAGNOSIS — J9600 Acute respiratory failure, unspecified whether with hypoxia or hypercapnia: Secondary | ICD-10-CM

## 2015-07-04 DIAGNOSIS — I491 Atrial premature depolarization: Secondary | ICD-10-CM

## 2015-07-04 DIAGNOSIS — J9601 Acute respiratory failure with hypoxia: Secondary | ICD-10-CM | POA: Diagnosis present

## 2015-07-04 DIAGNOSIS — Z79899 Other long term (current) drug therapy: Secondary | ICD-10-CM

## 2015-07-04 DIAGNOSIS — N179 Acute kidney failure, unspecified: Secondary | ICD-10-CM | POA: Diagnosis present

## 2015-07-04 DIAGNOSIS — Z841 Family history of disorders of kidney and ureter: Secondary | ICD-10-CM

## 2015-07-04 DIAGNOSIS — A414 Sepsis due to anaerobes: Principal | ICD-10-CM | POA: Diagnosis present

## 2015-07-04 DIAGNOSIS — C7951 Secondary malignant neoplasm of bone: Secondary | ICD-10-CM | POA: Diagnosis present

## 2015-07-04 DIAGNOSIS — G92 Toxic encephalopathy: Secondary | ICD-10-CM | POA: Diagnosis present

## 2015-07-04 DIAGNOSIS — A047 Enterocolitis due to Clostridium difficile: Secondary | ICD-10-CM | POA: Diagnosis present

## 2015-07-04 LAB — CULTURE, BLOOD (ROUTINE X 2)
CULTURE: NO GROWTH
Culture: NO GROWTH

## 2015-07-04 LAB — I-STAT CG4 LACTIC ACID, ED: Lactic Acid, Venous: 1.72 mmol/L (ref 0.5–2.0)

## 2015-07-04 MED ORDER — ACETAMINOPHEN 500 MG PO TABS
1000.0000 mg | ORAL_TABLET | Freq: Once | ORAL | Status: AC
  Administered 2015-07-04: 1000 mg via ORAL
  Filled 2015-07-04: qty 2

## 2015-07-04 MED ORDER — VANCOMYCIN HCL 10 G IV SOLR
1500.0000 mg | Freq: Once | INTRAVENOUS | Status: AC
  Administered 2015-07-04: 1500 mg via INTRAVENOUS
  Filled 2015-07-04: qty 1500

## 2015-07-04 MED ORDER — SODIUM CHLORIDE 0.9 % IV BOLUS (SEPSIS)
1000.0000 mL | INTRAVENOUS | Status: AC
  Administered 2015-07-04 – 2015-07-05 (×3): 1000 mL via INTRAVENOUS

## 2015-07-04 MED ORDER — PIPERACILLIN-TAZOBACTAM 3.375 G IVPB 30 MIN
3.3750 g | Freq: Once | INTRAVENOUS | Status: AC
  Administered 2015-07-04: 3.375 g via INTRAVENOUS
  Filled 2015-07-04: qty 50

## 2015-07-04 MED ORDER — IOHEXOL 300 MG/ML  SOLN
50.0000 mL | Freq: Once | INTRAMUSCULAR | Status: AC | PRN
  Administered 2015-07-04: 50 mL via ORAL

## 2015-07-04 MED ORDER — SODIUM CHLORIDE 0.9 % IV BOLUS (SEPSIS)
1000.0000 mL | Freq: Once | INTRAVENOUS | Status: AC
  Administered 2015-07-04: 1000 mL via INTRAVENOUS

## 2015-07-04 NOTE — ED Provider Notes (Signed)
CSN: [id number]     Arrival date & time 07/04/15  2255 History  By signing my name below, I, Irene Pap, attest that this documentation has been prepared under the direction and in the presence of Everlene Balls, MD. Electronically Signed: Irene Pap, ED Scribe. 07/04/2015. 2:22 AM.  Chief Complaint  Patient presents with  . Fever   The history is provided by the patient and a relative. No language interpreter was used.  HPI Comments: Jeremy Jennings is a 79 y.o. male with a hx of HTN, Type II DM, prostate cancer, GI bleed, and PAC brought in by EMS who presents to the Emergency Department complaining of fever tmax 105.2 F onset earlier today. Pt reports associated diarrhea, wheezing, and daughter states that he has been having chills "for a while." Pt is also complaining of abdominal discomfort that started when he got to the rehab center. Pt was given shots of Dextrose today for a CBG of 21. Pt was recently discharged for hernia operation with secondary pneumonia. Daughter believes that he was discharged to rehab center with anti-biotics.   Past Medical History  Diagnosis Date  . Essential hypertension   . Type 2 diabetes mellitus ([place])   . Hyperlipidemia   . Prostate cancer ([place])   . History of GI bleed   . Tachycardia   . PAC (premature atrial contraction)     Frequent   Past Surgical History  Procedure Laterality Date  . Tonsillectomy    . Hemorroidectomy  1969  . Esophagogastroduodenoscopy N/A 05/28/2015    Procedure: ESOPHAGOGASTRODUODENOSCOPY (EGD);  Surgeon: [name], MD;  Location: [name];  Service: Endoscopy;  Laterality: N/A;  . Inguinal hernia repair Bilateral AFE 16-17  . Hernia repair  1981    DR Wanda  . Laparotomy N/A 06/12/2015    Procedure: EXPLORATORY LAPAROTOMY;  Surgeon: [name], MD;  Location: [facility];  Service: General;  Laterality: N/A;  . Bowel resection N/A 06/12/2015    Procedure: SMALL BOWEL RESECTION;  Surgeon: [name], MD;  Location: [facility];  Service: General;  Laterality: N/A;   Family History  Problem Relation Age of Onset  . Cancer Neg Hx   . Kidney failure Brother   . Kidney failure Sister    Social History  Substance Use Topics  . Smoking status: Former Smoker -- 0.25 packs/day for 20 years    Types: Cigarettes    Quit date: 07/12/1979  . Smokeless tobacco: Never Used  . Alcohol Use: No    Review of Systems 10 Systems reviewed and all are negative for acute change except as noted in the HPI.  Allergies  Review of patient's allergies indicates no known allergies.  Home Medications   Prior to Admission medications   Medication Sig Start Date End Date Taking? Authorizing Provider  abiraterone Acetate (ZYTIGA) 250 MG tablet Take 4 tablets (1,000 mg total) by mouth daily. Take on an empty stomach 1 hour before or 2 hours after a meal 05/22/15   Wyatt Portela, MD  ACCU-CHEK SOFTCLIX LANCETS lancets Use as instructed to check blood sugar 2 times per day dx code E11.65 10/23/14   Elayne Snare, MD  acetaminophen (TYLENOL) 650 MG CR tablet Take 1 tablet (650 mg total) by mouth every 8 (eight) hours as needed for pain. 05/30/15   Florencia Reasons, MD  atorvastatin (LIPITOR) 10 MG tablet take 1 tablet by mouth once daily 07/09/14   Darlin Coco, MD  Blood Glucose Monitoring Suppl (ACCU-CHEK AVIVA PLUS)  W/DEVICE KIT Use to check blood sugar 2 times per day dx code E11.65 10/23/14   Elayne Snare, MD  carvedilol (COREG) 12.5 MG tablet Take 1 tablet (12.5 mg total) by mouth 2 (two) times daily with a meal. 08/19/14   Darlin Coco, MD  Cholecalciferol (VITAMIN D PO) Take 5,000 Units by mouth daily. Taking 5000 daily    Historical Provider, MD  COMBIGAN 0.2-0.5 % ophthalmic solution Place 1 drop into the right eye daily.  04/09/13   Historical Provider, MD  Cyanocobalamin (VITAMIN B-12 PO) Take 1 tablet by mouth daily.    Historical Provider, MD  diltiazem (TIAZAC) 360 MG 24 hr capsule Take 360 mg by mouth  daily.      Historical Provider, MD  docusate sodium (COLACE) 100 MG capsule Take 100 mg by mouth daily.     Historical Provider, MD  glucose blood (ACCU-CHEK AVIVA PLUS) test strip Use as instructed to check blood sugar 2 times per day dx code E11.65 10/23/14   Elayne Snare, MD  guaiFENesin (ROBITUSSIN) 100 MG/5ML liquid Take 200 mg by mouth 3 (three) times daily as needed for cough.    Historical Provider, MD  insulin aspart (NOVOLOG FLEXPEN) 100 UNIT/ML FlexPen Inject 12 units 3 times per day with meals 12/05/14   Elayne Snare, MD  Insulin Detemir (LEVEMIR) 100 UNIT/ML Pen Inject 12 Units into the skin daily at 10 pm.    Historical Provider, MD  Insulin Pen Needle (B-D UF III MINI PEN NEEDLES) 31G X 5 MM MISC 1 each by Does not apply route 2 (two) times daily. 04/16/14   Elayne Snare, MD  lactose free nutrition (BOOST PLUS) LIQD Take 237 mLs by mouth 2 (two) times daily between meals. 07/03/15   Saverio Danker, PA-C  Liraglutide 18 MG/3ML SOPN Inject 1.8 mg into the skin daily. 06/09/14   Elayne Snare, MD  megestrol (MEGACE) 400 MG/10ML suspension Take 10 mLs (400 mg total) by mouth 2 (two) times daily. 05/18/15   Wyatt Portela, MD  ondansetron (ZOFRAN) 4 MG tablet Take 1 tablet (4 mg total) by mouth every 8 (eight) hours as needed for nausea or vomiting. 07/03/15   Saverio Danker, PA-C  oxyCODONE-acetaminophen (PERCOCET/ROXICET) 5-325 MG tablet Take 1-2 tablets by mouth every 4 (four) hours as needed for moderate pain or severe pain. 07/03/15   Saverio Danker, PA-C  pioglitazone (ACTOS) 30 MG tablet Take 1 tablet (30 mg total) by mouth daily. 01/19/15   Elayne Snare, MD  prednisoLONE acetate (PRED FORTE) 1 % ophthalmic suspension Place 1 drop into both eyes 2 (two) times daily.  04/25/13   Historical Provider, MD  tamsulosin (FLOMAX) 0.4 MG CAPS capsule Take 0.4 mg by mouth daily.  08/02/13   Historical Provider, MD   BP 117/45 mmHg  Pulse 113  Temp(Src) 99.8 F (37.7 C) (Oral)  Resp 24  Wt 198 lb (89.812 kg)   SpO2 96% Physical Exam  Constitutional: He is oriented to person, place, and time. Vital signs are normal. He appears well-developed and well-nourished.  Non-toxic appearance. He does not appear ill. He appears distressed.  Tactile fever  HENT:  Head: Normocephalic and atraumatic.  Nose: Nose normal.  Mouth/Throat: Oropharynx is clear and moist. No oropharyngeal exudate.  Eyes: Conjunctivae and EOM are normal. Pupils are equal, round, and reactive to light. No scleral icterus.  Right pupil: irregularly shaped and non-reactive; status post cataract surgery  Neck: Normal range of motion. Neck supple. No tracheal deviation, no edema, no erythema  and normal range of motion present. No thyroid mass and no thyromegaly present.  Cardiovascular: Regular rhythm, S1 normal, S2 normal, normal heart sounds, intact distal pulses and normal pulses.  Tachycardia present.  Exam reveals no gallop and no friction rub.   No murmur heard. Pulmonary/Chest: Effort normal and breath sounds normal. Tachypnea noted. No respiratory distress. He has no wheezes. He has no rhonchi. He has no rales.  Abdominal: Soft. Normal appearance and bowel sounds are normal. He exhibits distension. He exhibits no ascites and no mass. There is no hepatosplenomegaly. There is no tenderness. There is no rebound, no guarding and no CVA tenderness.  Midline wound with steri-strips in place, appears clean, dry and intact; mild distention  Musculoskeletal: Normal range of motion. He exhibits no edema or tenderness.  Lymphadenopathy:    He has no cervical adenopathy.  Neurological: He is alert and oriented to person, place, and time. He has normal strength. No cranial nerve deficit or sensory deficit.  Moves all extremities; able to follow commands; drowsy but arousable to verbal stimuli  Skin: Skin is warm, dry and intact. No petechiae and no rash noted. He is not diaphoretic. No erythema. No pallor.  Psychiatric: He has a normal mood and  affect. His behavior is normal. Judgment normal.  Nursing note and vitals reviewed.   ED Course  Procedures (including critical care time) DIAGNOSTIC STUDIES: Oxygen Saturation is 96% on RA, normal by my interpretation.    COORDINATION OF CARE: 11:18 PM-Discussed treatment plan which includes labs and anti-biotics with daughter at bedside and daughter agreed to plan.   CRITICAL CARE Performed by: Everlene Balls, MD Total critical care time: 40 minutes Critical care time was exclusive of separately billable procedures and treating other patients. Critical care was necessary to treat or prevent imminent or life-threatening deterioration. Critical care was time spent personally by me on the following activities: development of treatment plan with patient and/or surrogate as well as nursing, discussions with consultants, evaluation of patient's response to treatment, examination of patient, obtaining history from patient or surrogate, ordering and performing treatments and interventions, ordering and review of laboratory studies, ordering and review of radiographic studies, pulse oximetry and re-evaluation of patient's condition.   Labs Review Labs Reviewed  C DIFFICILE QUICK SCREEN W PCR REFLEX - Abnormal; Notable for the following:    C Diff antigen POSITIVE (*)    C Diff toxin POSITIVE (*)    All other components within normal limits  CBC WITH DIFFERENTIAL/PLATELET - Abnormal; Notable for the following:    RBC 2.77 (*)    Hemoglobin 8.1 (*)    HCT 25.5 (*)    RDW 17.5 (*)    All other components within normal limits  COMPREHENSIVE METABOLIC PANEL - Abnormal; Notable for the following:    Potassium 3.4 (*)    Chloride 115 (*)    CO2 19 (*)    Glucose, Bld 106 (*)    BUN 21 (*)    Creatinine, Ser 1.38 (*)    Calcium 6.6 (*)    Total Protein 5.9 (*)    Albumin 2.0 (*)    AST 60 (*)    Alkaline Phosphatase 360 (*)    GFR calc non Af Amer 48 (*)    GFR calc Af Amer 55 (*)     All other components within normal limits  LIPASE, BLOOD - Abnormal; Notable for the following:    Lipase 52 (*)    All other components within normal limits  URINE CULTURE  CULTURE, BLOOD (ROUTINE X 2)  CULTURE, BLOOD (ROUTINE X 2)  URINALYSIS, ROUTINE W REFLEX MICROSCOPIC (NOT AT [facility])  I-STAT CG4 LACTIC ACID, ED    Imaging Review Dg Chest 2 View  07/05/2015  CLINICAL DATA:  Fever, nausea and diarrhea today. History of diabetes, hypertension, tachycardia. EXAM: CHEST  2 VIEW COMPARISON:  CT chest June 05, 2015 at 0118 hours FINDINGS: Cardiomediastinal silhouette is normal for this low inspiratory portable examination with crowded vascular markings. Nipple shadows in lung bases. Small pleural effusions better seen on today's CT chest. Bibasilar atelectasis. No pneumothorax. Diffusely sclerotic axial skeleton compatible with patient's history of metastatic prostate cancer. IMPRESSION: Small pleural effusions and bibasilar atelectasis better seen on today's dedicated CT chest. Electronically Signed   By: Elon Alas M.D.   On: 07/05/2015 02:08   Ct Chest W Contrast  07/05/2015  CLINICAL DATA:  Acute onset of generalized abdominal pain. Fever and diarrhea. Initial encounter. EXAM: CT CHEST, ABDOMEN, AND PELVIS WITH CONTRAST TECHNIQUE: Multidetector CT imaging of the chest, abdomen and pelvis was performed following the standard protocol during bolus administration of intravenous contrast. CONTRAST:  160m OMNIPAQUE IOHEXOL 300 MG/ML  SOLN COMPARISON:  CTA of the chest and CT of the abdomen and pelvis performed 06/19/2015 FINDINGS: CT CHEST Trace bilateral pleural effusions are noted, with mild bibasilar atelectasis. No pneumothorax is seen. No significant focal airspace consolidation is appreciated. No masses are identified. Diffuse coronary artery calcifications are seen. The mediastinum is otherwise unremarkable. No mediastinal lymphadenopathy is appreciated. The great vessels are grossly  unremarkable in appearance. 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 No pericardial effusion is identified. The thyroid gland is unremarkable in appearance. No axillary lymphadenopathy is appreciated. Diffuse sclerotic lesions are noted throughout the visualized osseous structures, reflecting known metastatic prostate cancer. CT ABDOMEN AND PELVIS The liver and spleen are unremarkable in appearance. The gallbladder is within normal limits. The pancreas and adrenal glands are unremarkable. Scattered bilateral renal cysts are seen, measuring up to 3.4 cm in size. There is no evidence of hydronephrosis. No renal or ureteral stones are seen. Mild nonspecific perinephric stranding is noted bilaterally. Contrast progresses into the renal calyces only on repeat delayed images at 11 minutes, concerning for renal insufficiency. No free fluid is identified. The small bowel is unremarkable in appearance. A bowel suture line at the mid abdomen is unremarkable. The stomach is within normal limits. No acute vascular abnormalities are seen. Minimal calcification is noted along the abdominal aorta and its branches. The appendix is normal in caliber, without evidence of appendicitis. The colon is unremarkable in appearance, aside from mild wall thickening along the rectum, raising question for mild proctitis. Mild nonspecific presacral stranding is noted. The bladder is decompressed and not well assessed. The prostate remains normal in size. No inguinal lymphadenopathy is seen. Diffuse sclerotic lesions are noted throughout the visualized osseous structures, reflecting known metastatic prostate cancer. IMPRESSION: 1. Trace bilateral pleural effusions, with mild bibasilar atelectasis. No definite evidence of pneumonia. 2. Apparent mild wall thickening along the rectum raises question for mild proctitis. Mild nonspecific presacral stranding noted. 3. Delayed excretion of contrast from the  kidneys, concerning for renal insufficiency. 4. Diffuse coronary artery calcifications seen. 5. 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 6. Scattered bilateral renal cysts seen. 7. Diffuse sclerotic lesions throughout the visualized osseous structures, reflecting known metastatic prostate cancer. Electronically Signed   By: JFrancoise SchaumannD.  On: 07/05/2015 01:49   Ct Abdomen Pelvis W Contrast  07/05/2015  CLINICAL DATA:  Acute onset of generalized abdominal pain. Fever and diarrhea. Initial encounter. EXAM: CT CHEST, ABDOMEN, AND PELVIS WITH CONTRAST TECHNIQUE: Multidetector CT imaging of the chest, abdomen and pelvis was performed following the standard protocol during bolus administration of intravenous contrast. CONTRAST:  172m OMNIPAQUE IOHEXOL 300 MG/ML  SOLN COMPARISON:  CTA of the chest and CT of the abdomen and pelvis performed 06/19/2015 FINDINGS: CT CHEST Trace bilateral pleural effusions are noted, with mild bibasilar atelectasis. No pneumothorax is seen. No significant focal airspace consolidation is appreciated. No masses are identified. Diffuse coronary artery calcifications are seen. The mediastinum is otherwise unremarkable. No mediastinal lymphadenopathy is appreciated. The great vessels are grossly unremarkable in appearance. 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 No pericardial effusion is identified. The thyroid gland is unremarkable in appearance. No axillary lymphadenopathy is appreciated. Diffuse sclerotic lesions are noted throughout the visualized osseous structures, reflecting known metastatic prostate cancer. CT ABDOMEN AND PELVIS The liver and spleen are unremarkable in appearance. The gallbladder is within normal limits. The pancreas and adrenal glands are unremarkable. Scattered bilateral renal cysts are seen, measuring up to 3.4 cm in size. There is no evidence of hydronephrosis. No renal or  ureteral stones are seen. Mild nonspecific perinephric stranding is noted bilaterally. Contrast progresses into the renal calyces only on repeat delayed images at 11 minutes, concerning for renal insufficiency. No free fluid is identified. The small bowel is unremarkable in appearance. A bowel suture line at the mid abdomen is unremarkable. The stomach is within normal limits. No acute vascular abnormalities are seen. Minimal calcification is noted along the abdominal aorta and its branches. The appendix is normal in caliber, without evidence of appendicitis. The colon is unremarkable in appearance, aside from mild wall thickening along the rectum, raising question for mild proctitis. Mild nonspecific presacral stranding is noted. The bladder is decompressed and not well assessed. The prostate remains normal in size. No inguinal lymphadenopathy is seen. Diffuse sclerotic lesions are noted throughout the visualized osseous structures, reflecting known metastatic prostate cancer. IMPRESSION: 1. Trace bilateral pleural effusions, with mild bibasilar atelectasis. No definite evidence of pneumonia. 2. Apparent mild wall thickening along the rectum raises question for mild proctitis. Mild nonspecific presacral stranding noted. 3. Delayed excretion of contrast from the kidneys, concerning for renal insufficiency. 4. Diffuse coronary artery calcifications seen. 5. 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 6. Scattered bilateral renal cysts seen. 7. Diffuse sclerotic lesions throughout the visualized osseous structures, reflecting known metastatic prostate cancer. Electronically Signed   By: JGarald BaldingM.D.   On: 07/05/2015 01:49   I have personally reviewed and evaluated these images and lab results as part of my medical decision-making.   EKG Interpretation None       MDM   Final diagnoses:  None    Patient presents to the emergency department for fever and chills. He  recently had abdominal surgery and was also diagnosed with pneumonia. He was sent home with antibiotics to rehabilitation, but he does not know if it has been given to him. Rectal temperature here is 105.2, pulse is 113, patient is septic. Blood pressure is okay at 117/45. Patient was given vancomycin and Zosyn after blood cultures were drawn. Will obtain CT scan of chest abdomen and pelvis for evaluation. Patient will need to be admitted for sepsis.  CT scan is negative  for any acute infection.  Family is requesting that we check for C diff, this si currently pending..  This was sent. I am unsure what the infectious cause is at this point. He continues to be febrile and tachycardic.  BP is borderline hypotensive.  I spoke with Dr. Loleta Books and patient will be admitted to step down for care.   I personally performed the services described in this documentation, which was scribed in my presence. The recorded information has been reviewed and is accurate.     Everlene Balls, MD 07/05/15 [phone]

## 2015-07-04 NOTE — ED Notes (Signed)
Brought in by EMS from [facility] facility with c/o fever and diarrhea.  Per EMS, pt has had a fever of T100.9 earlier and a couple episodes of loose BMs.

## 2015-07-04 NOTE — ED Notes (Signed)
Bed: [id number] Expected date:  Expected time:  Means of arrival:  Comments: Ems-fever, diarrhea

## 2015-07-05 ENCOUNTER — Inpatient Hospital Stay (HOSPITAL_COMMUNITY): Payer: Medicare Other

## 2015-07-05 ENCOUNTER — Emergency Department (HOSPITAL_COMMUNITY): Payer: Medicare Other

## 2015-07-05 DIAGNOSIS — Z841 Family history of disorders of kidney and ureter: Secondary | ICD-10-CM | POA: Diagnosis not present

## 2015-07-05 DIAGNOSIS — I959 Hypotension, unspecified: Secondary | ICD-10-CM | POA: Diagnosis present

## 2015-07-05 DIAGNOSIS — R7989 Other specified abnormal findings of blood chemistry: Secondary | ICD-10-CM | POA: Diagnosis not present

## 2015-07-05 DIAGNOSIS — Z7952 Long term (current) use of systemic steroids: Secondary | ICD-10-CM | POA: Diagnosis not present

## 2015-07-05 DIAGNOSIS — I5031 Acute diastolic (congestive) heart failure: Secondary | ICD-10-CM | POA: Diagnosis not present

## 2015-07-05 DIAGNOSIS — A414 Sepsis due to anaerobes: Secondary | ICD-10-CM | POA: Diagnosis present

## 2015-07-05 DIAGNOSIS — E119 Type 2 diabetes mellitus without complications: Secondary | ICD-10-CM

## 2015-07-05 DIAGNOSIS — D638 Anemia in other chronic diseases classified elsewhere: Secondary | ICD-10-CM | POA: Diagnosis present

## 2015-07-05 DIAGNOSIS — N178 Other acute kidney failure: Secondary | ICD-10-CM | POA: Diagnosis not present

## 2015-07-05 DIAGNOSIS — I491 Atrial premature depolarization: Secondary | ICD-10-CM | POA: Diagnosis present

## 2015-07-05 DIAGNOSIS — E870 Hyperosmolality and hypernatremia: Secondary | ICD-10-CM | POA: Diagnosis not present

## 2015-07-05 DIAGNOSIS — E46 Unspecified protein-calorie malnutrition: Secondary | ICD-10-CM | POA: Diagnosis present

## 2015-07-05 DIAGNOSIS — R509 Fever, unspecified: Secondary | ICD-10-CM | POA: Diagnosis present

## 2015-07-05 DIAGNOSIS — E669 Obesity, unspecified: Secondary | ICD-10-CM | POA: Diagnosis present

## 2015-07-05 DIAGNOSIS — I10 Essential (primary) hypertension: Secondary | ICD-10-CM | POA: Diagnosis not present

## 2015-07-05 DIAGNOSIS — I248 Other forms of acute ischemic heart disease: Secondary | ICD-10-CM | POA: Diagnosis present

## 2015-07-05 DIAGNOSIS — R740 Nonspecific elevation of levels of transaminase and lactic acid dehydrogenase [LDH]: Secondary | ICD-10-CM

## 2015-07-05 DIAGNOSIS — E872 Acidosis: Secondary | ICD-10-CM | POA: Diagnosis present

## 2015-07-05 DIAGNOSIS — E785 Hyperlipidemia, unspecified: Secondary | ICD-10-CM | POA: Diagnosis present

## 2015-07-05 DIAGNOSIS — Z8674 Personal history of sudden cardiac arrest: Secondary | ICD-10-CM | POA: Diagnosis not present

## 2015-07-05 DIAGNOSIS — C7951 Secondary malignant neoplasm of bone: Secondary | ICD-10-CM | POA: Diagnosis present

## 2015-07-05 DIAGNOSIS — E878 Other disorders of electrolyte and fluid balance, not elsewhere classified: Secondary | ICD-10-CM | POA: Diagnosis present

## 2015-07-05 DIAGNOSIS — E11649 Type 2 diabetes mellitus with hypoglycemia without coma: Secondary | ICD-10-CM | POA: Diagnosis present

## 2015-07-05 DIAGNOSIS — C61 Malignant neoplasm of prostate: Secondary | ICD-10-CM | POA: Diagnosis present

## 2015-07-05 DIAGNOSIS — A419 Sepsis, unspecified organism: Secondary | ICD-10-CM | POA: Diagnosis not present

## 2015-07-05 DIAGNOSIS — G92 Toxic encephalopathy: Secondary | ICD-10-CM | POA: Diagnosis present

## 2015-07-05 DIAGNOSIS — R6521 Severe sepsis with septic shock: Secondary | ICD-10-CM | POA: Diagnosis not present

## 2015-07-05 DIAGNOSIS — J9811 Atelectasis: Secondary | ICD-10-CM | POA: Diagnosis present

## 2015-07-05 DIAGNOSIS — Z79899 Other long term (current) drug therapy: Secondary | ICD-10-CM | POA: Diagnosis not present

## 2015-07-05 DIAGNOSIS — Z794 Long term (current) use of insulin: Secondary | ICD-10-CM

## 2015-07-05 DIAGNOSIS — Z87440 Personal history of urinary (tract) infections: Secondary | ICD-10-CM | POA: Diagnosis not present

## 2015-07-05 DIAGNOSIS — Z6826 Body mass index (BMI) 26.0-26.9, adult: Secondary | ICD-10-CM | POA: Diagnosis not present

## 2015-07-05 DIAGNOSIS — Z87891 Personal history of nicotine dependence: Secondary | ICD-10-CM | POA: Diagnosis not present

## 2015-07-05 DIAGNOSIS — I5033 Acute on chronic diastolic (congestive) heart failure: Secondary | ICD-10-CM | POA: Diagnosis not present

## 2015-07-05 DIAGNOSIS — N179 Acute kidney failure, unspecified: Secondary | ICD-10-CM | POA: Diagnosis present

## 2015-07-05 DIAGNOSIS — J9601 Acute respiratory failure with hypoxia: Secondary | ICD-10-CM | POA: Diagnosis not present

## 2015-07-05 DIAGNOSIS — E861 Hypovolemia: Secondary | ICD-10-CM | POA: Diagnosis present

## 2015-07-05 DIAGNOSIS — E876 Hypokalemia: Secondary | ICD-10-CM | POA: Diagnosis present

## 2015-07-05 DIAGNOSIS — J9600 Acute respiratory failure, unspecified whether with hypoxia or hypercapnia: Secondary | ICD-10-CM | POA: Diagnosis not present

## 2015-07-05 DIAGNOSIS — D500 Iron deficiency anemia secondary to blood loss (chronic): Secondary | ICD-10-CM | POA: Diagnosis present

## 2015-07-05 DIAGNOSIS — I493 Ventricular premature depolarization: Secondary | ICD-10-CM | POA: Diagnosis present

## 2015-07-05 DIAGNOSIS — A047 Enterocolitis due to Clostridium difficile: Secondary | ICD-10-CM | POA: Diagnosis present

## 2015-07-05 LAB — BLOOD GAS, ARTERIAL
Acid-base deficit: 9 mmol/L — ABNORMAL HIGH (ref 0.0–2.0)
Bicarbonate: 14.9 mEq/L — ABNORMAL LOW (ref 20.0–24.0)
Drawn by: 11249
O2 CONTENT: 4 L/min
O2 Saturation: 93.4 %
PCO2 ART: 25.3 mmHg — AB (ref 35.0–45.0)
PH ART: 7.386 (ref 7.350–7.450)
Patient temperature: 98.3
TCO2: 14.5 mmol/L (ref 0–100)
pO2, Arterial: 72.4 mmHg — ABNORMAL LOW (ref 80.0–100.0)

## 2015-07-05 LAB — BASIC METABOLIC PANEL
ANION GAP: 12 (ref 5–15)
Anion gap: 9 (ref 5–15)
BUN: 19 mg/dL (ref 6–20)
BUN: 21 mg/dL — AB (ref 6–20)
CALCIUM: 5.8 mg/dL — AB (ref 8.9–10.3)
CHLORIDE: 117 mmol/L — AB (ref 101–111)
CO2: 16 mmol/L — ABNORMAL LOW (ref 22–32)
CO2: 16 mmol/L — ABNORMAL LOW (ref 22–32)
CREATININE: 1.5 mg/dL — AB (ref 0.61–1.24)
Calcium: 5.6 mg/dL — CL (ref 8.9–10.3)
Chloride: 115 mmol/L — ABNORMAL HIGH (ref 101–111)
Creatinine, Ser: 1.45 mg/dL — ABNORMAL HIGH (ref 0.61–1.24)
GFR calc Af Amer: 50 mL/min — ABNORMAL LOW (ref >60)
GFR calc non Af Amer: 45 mL/min — ABNORMAL LOW (ref >60)
GFR, EST AFRICAN AMERICAN: 52 mL/min — AB (ref >60)
GFR, EST NON AFRICAN AMERICAN: 43 mL/min — AB (ref >60)
GLUCOSE: 148 mg/dL — AB (ref 65–99)
Glucose, Bld: 113 mg/dL — ABNORMAL HIGH (ref 65–99)
POTASSIUM: 2.7 mmol/L — AB (ref 3.5–5.1)
Potassium: 4.3 mmol/L (ref 3.5–5.1)
SODIUM: 142 mmol/L (ref 135–145)
Sodium: 143 mmol/L (ref 135–145)

## 2015-07-05 LAB — CBC
HCT: 26.3 % — ABNORMAL LOW (ref 39.0–52.0)
Hemoglobin: 8.7 g/dL — ABNORMAL LOW (ref 13.0–17.0)
MCH: 29.6 pg (ref 26.0–34.0)
MCHC: 33.1 g/dL (ref 30.0–36.0)
MCV: 89.5 fL (ref 78.0–100.0)
Platelets: 206 10*3/uL (ref 150–400)
RBC: 2.94 MIL/uL — ABNORMAL LOW (ref 4.22–5.81)
RDW: 16.8 % — ABNORMAL HIGH (ref 11.5–15.5)
WBC: 12.1 10*3/uL — ABNORMAL HIGH (ref 4.0–10.5)

## 2015-07-05 LAB — CBC WITH DIFFERENTIAL/PLATELET
BASOS PCT: 0 %
Basophils Absolute: 0 10*3/uL (ref 0.0–0.1)
EOS PCT: 1 %
Eosinophils Absolute: 0.1 10*3/uL (ref 0.0–0.7)
HCT: 25.5 % — ABNORMAL LOW (ref 39.0–52.0)
Hemoglobin: 8.1 g/dL — ABNORMAL LOW (ref 13.0–17.0)
Lymphocytes Relative: 16 %
Lymphs Abs: 0.9 10*3/uL (ref 0.7–4.0)
MCH: 29.2 pg (ref 26.0–34.0)
MCHC: 31.8 g/dL (ref 30.0–36.0)
MCV: 92.1 fL (ref 78.0–100.0)
MONO ABS: 0.5 10*3/uL (ref 0.1–1.0)
Monocytes Relative: 9 %
NEUTROS PCT: 74 %
Neutro Abs: 3.9 10*3/uL (ref 1.7–7.7)
PLATELETS: 252 10*3/uL (ref 150–400)
RBC: 2.77 MIL/uL — ABNORMAL LOW (ref 4.22–5.81)
RDW: 17.5 % — AB (ref 11.5–15.5)
WBC Morphology: INCREASED
WBC: 5.4 10*3/uL (ref 4.0–10.5)

## 2015-07-05 LAB — LIPASE, BLOOD: Lipase: 52 U/L — ABNORMAL HIGH (ref 11–51)

## 2015-07-05 LAB — COMPREHENSIVE METABOLIC PANEL
ALBUMIN: 2 g/dL — AB (ref 3.5–5.0)
ALK PHOS: 360 U/L — AB (ref 38–126)
ALT: 45 U/L (ref 17–63)
ANION GAP: 11 (ref 5–15)
AST: 60 U/L — ABNORMAL HIGH (ref 15–41)
BUN: 21 mg/dL — ABNORMAL HIGH (ref 6–20)
CALCIUM: 6.6 mg/dL — AB (ref 8.9–10.3)
CO2: 19 mmol/L — AB (ref 22–32)
Chloride: 115 mmol/L — ABNORMAL HIGH (ref 101–111)
Creatinine, Ser: 1.38 mg/dL — ABNORMAL HIGH (ref 0.61–1.24)
GFR calc Af Amer: 55 mL/min — ABNORMAL LOW (ref >60)
GFR calc non Af Amer: 48 mL/min — ABNORMAL LOW (ref >60)
GLUCOSE: 106 mg/dL — AB (ref 65–99)
POTASSIUM: 3.4 mmol/L — AB (ref 3.5–5.1)
Sodium: 145 mmol/L (ref 135–145)
Total Bilirubin: 0.8 mg/dL (ref 0.3–1.2)
Total Protein: 5.9 g/dL — ABNORMAL LOW (ref 6.5–8.1)

## 2015-07-05 LAB — MAGNESIUM
MAGNESIUM: 1.5 mg/dL — AB (ref 1.7–2.4)
Magnesium: 1.6 mg/dL — ABNORMAL LOW (ref 1.7–2.4)

## 2015-07-05 LAB — TROPONIN I
TROPONIN I: 0.11 ng/mL — AB (ref <0.031)
Troponin I: 0.12 ng/mL — ABNORMAL HIGH (ref <0.031)
Troponin I: 0.16 ng/mL — ABNORMAL HIGH (ref <0.031)

## 2015-07-05 LAB — CARBOXYHEMOGLOBIN
CARBOXYHEMOGLOBIN: 1.5 % (ref 0.5–1.5)
METHEMOGLOBIN: 1.2 % (ref 0.0–1.5)
O2 Saturation: 77 %
Total hemoglobin: 5.8 g/dL — CL (ref 13.5–18.0)

## 2015-07-05 LAB — GLUCOSE, CAPILLARY
GLUCOSE-CAPILLARY: 98 mg/dL (ref 65–99)
Glucose-Capillary: 148 mg/dL — ABNORMAL HIGH (ref 65–99)
Glucose-Capillary: 166 mg/dL — ABNORMAL HIGH (ref 65–99)
Glucose-Capillary: 131 mg/dL — ABNORMAL HIGH (ref 65–99)

## 2015-07-05 LAB — I-STAT CG4 LACTIC ACID, ED: LACTIC ACID, VENOUS: 1.3 mmol/L (ref 0.5–2.0)

## 2015-07-05 LAB — C DIFFICILE QUICK SCREEN W PCR REFLEX
C DIFFICILE (CDIFF) TOXIN: POSITIVE — AB
C DIFFICLE (CDIFF) ANTIGEN: POSITIVE — AB
C Diff interpretation: POSITIVE

## 2015-07-05 LAB — PHOSPHORUS
PHOSPHORUS: 2.1 mg/dL — AB (ref 2.5–4.6)
Phosphorus: 3.8 mg/dL (ref 2.5–4.6)

## 2015-07-05 LAB — PREPARE RBC (CROSSMATCH)

## 2015-07-05 LAB — LACTIC ACID, PLASMA: LACTIC ACID, VENOUS: 2.1 mmol/L — AB (ref 0.5–2.0)

## 2015-07-05 LAB — CORTISOL: CORTISOL PLASMA: 23.9 ug/dL

## 2015-07-05 LAB — PROCALCITONIN
Procalcitonin: 21.32 ng/mL
Procalcitonin: 22.98 ng/mL

## 2015-07-05 LAB — MRSA PCR SCREENING: MRSA BY PCR: NEGATIVE

## 2015-07-05 LAB — HEMOGLOBIN AND HEMATOCRIT, BLOOD
HEMATOCRIT: 19.1 % — AB (ref 39.0–52.0)
Hemoglobin: 6 g/dL — CL (ref 13.0–17.0)

## 2015-07-05 LAB — BRAIN NATRIURETIC PEPTIDE: B Natriuretic Peptide: 348 pg/mL — ABNORMAL HIGH (ref 0.0–100.0)

## 2015-07-05 MED ORDER — ALBUMIN HUMAN 5 % IV SOLN
25.0000 g | Freq: Once | INTRAVENOUS | Status: AC
  Administered 2015-07-05: 25 g via INTRAVENOUS
  Filled 2015-07-05: qty 500

## 2015-07-05 MED ORDER — SODIUM CHLORIDE 0.9 % IV SOLN: 250.0000 mL | INTRAVENOUS | Status: DC | PRN

## 2015-07-05 MED ORDER — HYDROCORTISONE NA SUCCINATE PF 100 MG IJ SOLR
100.0000 mg | Freq: Three times a day (TID) | INTRAMUSCULAR | Status: DC
  Administered 2015-07-05 – 2015-07-06 (×3): 100 mg via INTRAVENOUS
  Filled 2015-07-05 (×3): qty 2

## 2015-07-05 MED ORDER — CETYLPYRIDINIUM CHLORIDE 0.05 % MT LIQD
7.0000 mL | Freq: Two times a day (BID) | OROMUCOSAL | Status: DC
  Administered 2015-07-05 – 2015-07-17 (×17): 7 mL via OROMUCOSAL

## 2015-07-05 MED ORDER — PHENOL 1.4 % MT LIQD
1.0000 | OROMUCOSAL | Status: DC | PRN
  Filled 2015-07-05: qty 177

## 2015-07-05 MED ORDER — METRONIDAZOLE IN NACL 5-0.79 MG/ML-% IV SOLN
500.0000 mg | Freq: Three times a day (TID) | INTRAVENOUS | Status: DC
  Administered 2015-07-05: 500 mg via INTRAVENOUS
  Filled 2015-07-05: qty 100

## 2015-07-05 MED ORDER — DEXTROSE 5 % IV SOLN
2.0000 ug/min | INTRAVENOUS | Status: DC
  Administered 2015-07-05: 2 ug/min via INTRAVENOUS
  Administered 2015-07-05: 40 ug/min via INTRAVENOUS
  Administered 2015-07-05: 30 ug/min via INTRAVENOUS
  Filled 2015-07-05 (×3): qty 4

## 2015-07-05 MED ORDER — POTASSIUM CHLORIDE 10 MEQ/50ML IV SOLN
10.0000 meq | INTRAVENOUS | Status: AC
Start: 2015-07-05 — End: 2015-07-05
  Administered 2015-07-05 (×6): 10 meq via INTRAVENOUS
  Filled 2015-07-05 (×6): qty 50

## 2015-07-05 MED ORDER — ENOXAPARIN SODIUM 40 MG/0.4ML ~~LOC~~ SOLN
40.0000 mg | SUBCUTANEOUS | Status: DC
Start: 2015-07-05 — End: 2015-07-05

## 2015-07-05 MED ORDER — IOHEXOL 300 MG/ML  SOLN
100.0000 mL | Freq: Once | INTRAMUSCULAR | Status: AC | PRN
  Administered 2015-07-05: 100 mL via INTRAVENOUS

## 2015-07-05 MED ORDER — VASOPRESSIN 20 UNIT/ML IV SOLN
0.0300 [IU]/min | INTRAVENOUS | Status: DC
  Administered 2015-07-05: 0.03 [IU]/min via INTRAVENOUS
  Filled 2015-07-05: qty 2

## 2015-07-05 MED ORDER — SODIUM CHLORIDE 0.9 % IV BOLUS (SEPSIS)
2000.0000 mL | Freq: Once | INTRAVENOUS | Status: AC
  Administered 2015-07-05: 2000 mL via INTRAVENOUS

## 2015-07-05 MED ORDER — POTASSIUM PHOSPHATES 15 MMOLE/5ML IV SOLN
30.0000 mmol | Freq: Once | INTRAVENOUS | Status: AC
  Administered 2015-07-05: 30 mmol via INTRAVENOUS
  Filled 2015-07-05: qty 10

## 2015-07-05 MED ORDER — DEXTROSE 5 % IV SOLN
2.0000 ug/min | INTRAVENOUS | Status: DC
  Administered 2015-07-05: 15 ug/min via INTRAVENOUS
  Filled 2015-07-05 (×2): qty 16

## 2015-07-05 MED ORDER — VANCOMYCIN 50 MG/ML ORAL SOLUTION
125.0000 mg | Freq: Four times a day (QID) | ORAL | Status: DC
  Filled 2015-07-05 (×5): qty 2.5

## 2015-07-05 MED ORDER — CHLORHEXIDINE GLUCONATE 0.12 % MT SOLN
15.0000 mL | Freq: Two times a day (BID) | OROMUCOSAL | Status: DC
  Administered 2015-07-05 – 2015-07-16 (×22): 15 mL via OROMUCOSAL
  Filled 2015-07-05 (×22): qty 15

## 2015-07-05 MED ORDER — SODIUM CHLORIDE 0.9 % IV BOLUS (SEPSIS)
1000.0000 mL | Freq: Once | INTRAVENOUS | Status: AC
  Administered 2015-07-05: 1000 mL via INTRAVENOUS

## 2015-07-05 MED ORDER — METRONIDAZOLE IN NACL 5-0.79 MG/ML-% IV SOLN
500.0000 mg | Freq: Four times a day (QID) | INTRAVENOUS | Status: DC
  Administered 2015-07-05 – 2015-07-07 (×8): 500 mg via INTRAVENOUS
  Filled 2015-07-05 (×8): qty 100

## 2015-07-05 MED ORDER — SACCHAROMYCES BOULARDII 250 MG PO CAPS
250.0000 mg | ORAL_CAPSULE | Freq: Two times a day (BID) | ORAL | Status: DC
  Administered 2015-07-05 – 2015-07-17 (×24): 250 mg via ORAL
  Filled 2015-07-05 (×26): qty 1

## 2015-07-05 MED ORDER — VANCOMYCIN 50 MG/ML ORAL SOLUTION
500.0000 mg | Freq: Four times a day (QID) | ORAL | Status: DC
  Administered 2015-07-05 – 2015-07-17 (×49): 500 mg via ORAL
  Filled 2015-07-05 (×58): qty 10

## 2015-07-05 MED ORDER — ACETAMINOPHEN 325 MG PO TABS: 650.0000 mg | ORAL_TABLET | ORAL | Status: DC | PRN

## 2015-07-05 MED ORDER — STERILE WATER FOR INJECTION IV SOLN
INTRAVENOUS | Status: DC
Start: 2015-07-05 — End: 2015-07-05
  Administered 2015-07-05: 10:00:00 via INTRAVENOUS
  Filled 2015-07-05: qty 850

## 2015-07-05 MED ORDER — INSULIN ASPART 100 UNIT/ML ~~LOC~~ SOLN
2.0000 [IU] | SUBCUTANEOUS | Status: DC
  Administered 2015-07-05: 4 [IU] via SUBCUTANEOUS
  Administered 2015-07-05 – 2015-07-06 (×3): 2 [IU] via SUBCUTANEOUS
  Administered 2015-07-06: 4 [IU] via SUBCUTANEOUS
  Administered 2015-07-06 – 2015-07-07 (×5): 2 [IU] via SUBCUTANEOUS

## 2015-07-05 MED ORDER — ONDANSETRON HCL 4 MG/2ML IJ SOLN: 4.0000 mg | Freq: Four times a day (QID) | INTRAMUSCULAR | Status: DC | PRN

## 2015-07-05 MED ORDER — IBUPROFEN 200 MG PO TABS
600.0000 mg | ORAL_TABLET | Freq: Once | ORAL | Status: AC
  Administered 2015-07-05: 600 mg via ORAL
  Filled 2015-07-05: qty 3

## 2015-07-05 MED ORDER — IPRATROPIUM-ALBUTEROL 0.5-2.5 (3) MG/3ML IN SOLN
3.0000 mL | Freq: Four times a day (QID) | RESPIRATORY_TRACT | Status: DC
  Administered 2015-07-05 – 2015-07-07 (×5): 3 mL via RESPIRATORY_TRACT
  Filled 2015-07-05 (×5): qty 3

## 2015-07-05 MED ORDER — SODIUM CHLORIDE 0.9 % IV SOLN
Freq: Once | INTRAVENOUS | Status: AC
  Administered 2015-07-05: 15:00:00 via INTRAVENOUS

## 2015-07-05 MED ORDER — SODIUM CHLORIDE 0.9 % IV SOLN
2.0000 g | Freq: Once | INTRAVENOUS | Status: AC
  Administered 2015-07-05: 2 g via INTRAVENOUS
  Filled 2015-07-05: qty 20

## 2015-07-05 MED ORDER — VANCOMYCIN HCL 500 MG IV SOLR
500.0000 mg | Freq: Four times a day (QID) | Status: DC
  Administered 2015-07-05 – 2015-07-07 (×8): 500 mg via RECTAL
  Filled 2015-07-05 (×19): qty 500

## 2015-07-05 NOTE — Progress Notes (Signed)
Subjective: Pt readmitted with Cdiff infection and sepsis. Req min pressors at this time  Objective: Vital signs in last 24 hours: Temp:  [99.8 F (37.7 C)-105.2 F (40.7 C)] 102.7 F (39.3 C) (12/25 0153) Pulse Rate:  [91-113] 91 (12/25 0845) Resp:  [20-33] 26 (12/25 0845) BP: (74-117)/(28-92) 80/41 mmHg (12/25 0845) SpO2:  [93 %-100 %] 98 % (12/25 0845) Weight:  [89.812 kg (198 lb)] 89.812 kg (198 lb) (12/24 2303) Last BM Date: 07/05/15  Intake/Output from previous day: 12/24 0701 - 12/25 0700 In: 2168.8 [I.V.:2168.8] Out: -  Intake/Output this shift:    General appearance: alert and cooperative GI: soft, non ttp, no rebound/guarding, no ndistended  Lab Results:   Recent Labs  07/04/15 2325 07/05/15 0810  WBC 5.4 5.7  HGB 8.1* 6.0*  HCT 25.5* 18.6*  PLT 252 202   BMET  Recent Labs  07/04/15 2325 07/05/15 0810  NA 145 142  K 3.4* 2.7*  CL 115* 117*  CO2 19* 16*  GLUCOSE 106* 113*  BUN 21* 19  CREATININE 1.38* 1.45*  CALCIUM 6.6* 5.6*   PT/INR No results for input(s): LABPROT, INR in the last 72 hours. ABG  Recent Labs  07/05/15 0519  PHART 7.386  HCO3 14.9*    Studies/Results: Dg Chest 2 View  07/05/2015  CLINICAL DATA:  Fever, nausea and diarrhea today. History of diabetes, hypertension, tachycardia. EXAM: CHEST  2 VIEW COMPARISON:  CT chest June 05, 2015 at 0118 hours FINDINGS: Cardiomediastinal silhouette is normal for this low inspiratory portable examination with crowded vascular markings. Nipple shadows in lung bases. Small pleural effusions better seen on today's CT chest. Bibasilar atelectasis. No pneumothorax. Diffusely sclerotic axial skeleton compatible with patient's history of metastatic prostate cancer. IMPRESSION: Small pleural effusions and bibasilar atelectasis better seen on today's dedicated CT chest. Electronically Signed   By: Elon Alas M.D.   On: 07/05/2015 02:08   Ct Chest W Contrast  07/05/2015  CLINICAL  DATA:  Acute onset of generalized abdominal pain. Fever and diarrhea. Initial encounter. EXAM: CT CHEST, ABDOMEN, AND PELVIS WITH CONTRAST TECHNIQUE: Multidetector CT imaging of the chest, abdomen and pelvis was performed following the standard protocol during bolus administration of intravenous contrast. CONTRAST:  149mL OMNIPAQUE IOHEXOL 300 MG/ML  SOLN COMPARISON:  CTA of the chest and CT of the abdomen and pelvis performed 06/19/2015 FINDINGS: CT CHEST Trace bilateral pleural effusions are noted, with mild bibasilar atelectasis. No pneumothorax is seen. No significant focal airspace consolidation is appreciated. No masses are identified. Diffuse coronary artery calcifications are seen. The mediastinum is otherwise unremarkable. No mediastinal lymphadenopathy is appreciated. The great vessels are grossly unremarkable in appearance. 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 No pericardial effusion is identified. The thyroid gland is unremarkable in appearance. No axillary lymphadenopathy is appreciated. Diffuse sclerotic lesions are noted throughout the visualized osseous structures, reflecting known metastatic prostate cancer. CT ABDOMEN AND PELVIS The liver and spleen are unremarkable in appearance. The gallbladder is within normal limits. The pancreas and adrenal glands are unremarkable. Scattered bilateral renal cysts are seen, measuring up to 3.4 cm in size. There is no evidence of hydronephrosis. No renal or ureteral stones are seen. Mild nonspecific perinephric stranding is noted bilaterally. Contrast progresses into the renal calyces only on repeat delayed images at 11 minutes, concerning for renal insufficiency. No free fluid is identified. The small bowel is unremarkable in appearance. A bowel suture line at the mid abdomen is unremarkable. The  stomach is within normal limits. No acute vascular abnormalities are seen. Minimal calcification is noted along the  abdominal aorta and its branches. The appendix is normal in caliber, without evidence of appendicitis. The colon is unremarkable in appearance, aside from mild wall thickening along the rectum, raising question for mild proctitis. Mild nonspecific presacral stranding is noted. The bladder is decompressed and not well assessed. The prostate remains normal in size. No inguinal lymphadenopathy is seen. Diffuse sclerotic lesions are noted throughout the visualized osseous structures, reflecting known metastatic prostate cancer. IMPRESSION: 1. Trace bilateral pleural effusions, with mild bibasilar atelectasis. No definite evidence of pneumonia. 2. Apparent mild wall thickening along the rectum raises question for mild proctitis. Mild nonspecific presacral stranding noted. 3. Delayed excretion of contrast from the kidneys, concerning for renal insufficiency. 4. Diffuse coronary artery calcifications seen. 5. 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 6. Scattered bilateral renal cysts seen. 7. Diffuse sclerotic lesions throughout the visualized osseous structures, reflecting known metastatic prostate cancer. Electronically Signed   By: Garald Balding M.D.   On: 07/05/2015 01:49   Ct Abdomen Pelvis W Contrast  07/05/2015  CLINICAL DATA:  Acute onset of generalized abdominal pain. Fever and diarrhea. Initial encounter. EXAM: CT CHEST, ABDOMEN, AND PELVIS WITH CONTRAST TECHNIQUE: Multidetector CT imaging of the chest, abdomen and pelvis was performed following the standard protocol during bolus administration of intravenous contrast. CONTRAST:  147mL OMNIPAQUE IOHEXOL 300 MG/ML  SOLN COMPARISON:  CTA of the chest and CT of the abdomen and pelvis performed 06/19/2015 FINDINGS: CT CHEST Trace bilateral pleural effusions are noted, with mild bibasilar atelectasis. No pneumothorax is seen. No significant focal airspace consolidation is appreciated. No masses are identified. Diffuse coronary  artery calcifications are seen. The mediastinum is otherwise unremarkable. No mediastinal lymphadenopathy is appreciated. The great vessels are grossly unremarkable in appearance. 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 No pericardial effusion is identified. The thyroid gland is unremarkable in appearance. No axillary lymphadenopathy is appreciated. Diffuse sclerotic lesions are noted throughout the visualized osseous structures, reflecting known metastatic prostate cancer. CT ABDOMEN AND PELVIS The liver and spleen are unremarkable in appearance. The gallbladder is within normal limits. The pancreas and adrenal glands are unremarkable. Scattered bilateral renal cysts are seen, measuring up to 3.4 cm in size. There is no evidence of hydronephrosis. No renal or ureteral stones are seen. Mild nonspecific perinephric stranding is noted bilaterally. Contrast progresses into the renal calyces only on repeat delayed images at 11 minutes, concerning for renal insufficiency. No free fluid is identified. The small bowel is unremarkable in appearance. A bowel suture line at the mid abdomen is unremarkable. The stomach is within normal limits. No acute vascular abnormalities are seen. Minimal calcification is noted along the abdominal aorta and its branches. The appendix is normal in caliber, without evidence of appendicitis. The colon is unremarkable in appearance, aside from mild wall thickening along the rectum, raising question for mild proctitis. Mild nonspecific presacral stranding is noted. The bladder is decompressed and not well assessed. The prostate remains normal in size. No inguinal lymphadenopathy is seen. Diffuse sclerotic lesions are noted throughout the visualized osseous structures, reflecting known metastatic prostate cancer. IMPRESSION: 1. Trace bilateral pleural effusions, with mild bibasilar atelectasis. No definite evidence of pneumonia. 2. Apparent mild wall  thickening along the rectum raises question for mild proctitis. Mild nonspecific presacral stranding noted. 3. Delayed excretion of contrast from the kidneys, concerning for renal  insufficiency. 4. Diffuse coronary artery calcifications seen. 5. 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 6. Scattered bilateral renal cysts seen. 7. Diffuse sclerotic lesions throughout the visualized osseous structures, reflecting known metastatic prostate cancer. Electronically Signed   By: Garald Balding M.D.   On: 07/05/2015 01:49   Dg Chest Port 1 View  07/05/2015  CLINICAL DATA:  Central line placement EXAM: PORTABLE CHEST 1 VIEW COMPARISON:  July 05, 2015 1:37 a.m. FINDINGS: The heart size and mediastinal contours are stable. Left jugular central venous line is identified with distal tip in superior vena cava. There is no pneumothorax. There are are mild atelectasis of both lung bases with minimal left pleural effusion. The visualized skeletal structures are stable. IMPRESSION: Left central venous line distal tip in the superior vena cava. There is no pneumothorax. Mild atelectasis of both lung bases with minimal left pleural effusion. Electronically Signed   By: Abelardo Diesel M.D.   On: 07/05/2015 07:26    Anti-infectives: Anti-infectives    Start     Dose/Rate Route Frequency Ordered Stop   07/05/15 1200  metroNIDAZOLE (FLAGYL) IVPB 500 mg     500 mg 100 mL/hr over 60 Minutes Intravenous Every 6 hours 07/05/15 0439     07/05/15 0600  vancomycin (VANCOCIN) 50 mg/mL oral solution 125 mg     125 mg Oral 4 times per day 07/05/15 0350     07/05/15 0430  metroNIDAZOLE (FLAGYL) IVPB 500 mg  Status:  Discontinued     500 mg 100 mL/hr over 60 Minutes Intravenous Every 8 hours 07/05/15 0427 07/05/15 0439   07/04/15 2330  piperacillin-tazobactam (ZOSYN) IVPB 3.375 g     3.375 g 100 mL/hr over 30 Minutes Intravenous  Once 07/04/15 2320 07/05/15 0020   07/04/15 2330  vancomycin  (VANCOCIN) 1,500 mg in sodium chloride 0.9 % 500 mL IVPB     1,500 mg 250 mL/hr over 120 Minutes Intravenous  Once 07/04/15 2320 07/05/15 0047      Assessment/Plan: Cdiff colitis Sepsis 1. Agree with NPO, and PO Vanc/flagyl 2. Will con't to follow abd exams.  No acute surgical plans.   LOS: 0 days    Rosario Jacks., [facility] 07/05/2015

## 2015-07-05 NOTE — H&P (Signed)
PULMONARY / CRITICAL CARE MEDICINE   Name: DENVIL CANNING MRN: [id number] DOB: 1936-01-23    ADMISSION DATE:  07/04/2015 CONSULTATION DATE:  07/05/15  REFERRING MD:  Claudine Mouton  CHIEF COMPLAINT:  fever  HISTORY OF PRESENT ILLNESS:   Jeremy Jennings is a 79 y.o. male with PMHx listed below who presents from NH with complaints of fever and diarrhea. He was recently hospitalized for SBO requiring ex-lap complicated by brief cardiac arrest as well as UTI on vanc/Zosyn. He was discharged yesterday.   At the rehab facility, he was ambulating around without walker for short distances, talking and conversational, and close to his previous baseline (he lives independently and still drives). This morning and afternoon, per pts daughter, there was a clinical change where he was lethargic and febrile with abominal pain and distension. He was subsequently sent from SNF to the hospital where he was noted to have a T of 105F, tachycardic, tachycpneic and with initially soft blood pressure and hypoxic.   The patient had four watery BMs while in the ED, and was found to be c.diff +.He received 4L IVF + antibiotics.   PAST MEDICAL HISTORY :  He  has a past medical history of Essential hypertension; Type 2 diabetes mellitus ([place]); Hyperlipidemia; Prostate cancer ([place]); History of GI bleed; Tachycardia; and PAC (premature atrial contraction).  PAST SURGICAL HISTORY: He  has past surgical history that includes Tonsillectomy; Hemorroidectomy (1969); Esophagogastroduodenoscopy (N/A, 05/28/2015); Inguinal hernia repair (Bilateral, AFE 16-17); Hernia repair (1981); laparotomy (N/A, 06/12/2015); and Bowel resection (N/A, 06/12/2015).  No Known Allergies  No current facility-administered medications on file prior to encounter.   Current Outpatient Prescriptions on File Prior to Encounter  Medication Sig  . abiraterone Acetate (ZYTIGA) 250 MG tablet Take 4 tablets (1,000 mg total) by mouth daily. Take on an empty  stomach 1 hour before or 2 hours after a meal  . ACCU-CHEK SOFTCLIX LANCETS lancets Use as instructed to check blood sugar 2 times per day dx code E11.65  . acetaminophen (TYLENOL) 650 MG CR tablet Take 1 tablet (650 mg total) by mouth every 8 (eight) hours as needed for pain.  Marland Kitchen atorvastatin (LIPITOR) 10 MG tablet take 1 tablet by mouth once daily  . Blood Glucose Monitoring Suppl (ACCU-CHEK AVIVA PLUS) W/DEVICE KIT Use to check blood sugar 2 times per day dx code E11.65  . carvedilol (COREG) 12.5 MG tablet Take 1 tablet (12.5 mg total) by mouth 2 (two) times daily with a meal.  . Cholecalciferol (VITAMIN D PO) Take 5,000 Units by mouth daily. Taking 5000 daily  . COMBIGAN 0.2-0.5 % ophthalmic solution Place 1 drop into the right eye daily.   Marland Kitchen diltiazem (TIAZAC) 360 MG 24 hr capsule Take 360 mg by mouth daily.    Marland Kitchen docusate sodium (COLACE) 100 MG capsule Take 100 mg by mouth daily.   Marland Kitchen glucose blood (ACCU-CHEK AVIVA PLUS) test strip Use as instructed to check blood sugar 2 times per day dx code E11.65  . guaiFENesin (ROBITUSSIN) 100 MG/5ML liquid Take 200 mg by mouth 3 (three) times daily as needed for cough.  . insulin aspart (NOVOLOG FLEXPEN) 100 UNIT/ML FlexPen Inject 12 units 3 times per day with meals  . Insulin Detemir (LEVEMIR) 100 UNIT/ML Pen Inject 12 Units into the skin daily at 10 pm.  . Insulin Pen Needle (B-D UF III MINI PEN NEEDLES) 31G X 5 MM MISC 1 each by Does not apply route 2 (two) times daily.  Marland Kitchen lactose free  nutrition (BOOST PLUS) LIQD Take 237 mLs by mouth 2 (two) times daily between meals.  . Liraglutide 18 MG/3ML SOPN Inject 1.8 mg into the skin daily.  . megestrol (MEGACE) 400 MG/10ML suspension Take 10 mLs (400 mg total) by mouth 2 (two) times daily.  . ondansetron (ZOFRAN) 4 MG tablet Take 1 tablet (4 mg total) by mouth every 8 (eight) hours as needed for nausea or vomiting.  Marland Kitchen oxyCODONE-acetaminophen (PERCOCET/ROXICET) 5-325 MG tablet Take 1-2 tablets by mouth every 4  (four) hours as needed for moderate pain or severe pain.  . pioglitazone (ACTOS) 30 MG tablet Take 1 tablet (30 mg total) by mouth daily.  . prednisoLONE acetate (PRED FORTE) 1 % ophthalmic suspension Place 1 drop into both eyes 2 (two) times daily.   . tamsulosin (FLOMAX) 0.4 MG CAPS capsule Take 0.4 mg by mouth daily.     FAMILY HISTORY:  His indicated that his mother is deceased. He indicated that his father is deceased.   SOCIAL HISTORY: He  reports that he quit smoking about 36 years ago. His smoking use included Cigarettes. He has a 5 pack-year smoking history. He has never used smokeless tobacco. He reports that he does not drink alcohol or use illicit drugs.  REVIEW OF SYSTEMS:   Constitutional: + fever,+ chills, +weight loss, +malaise/fatigue and+ diaphoresis.  HENT: Negative for hearing loss, ear pain, nosebleeds, congestion,  Eyes: Negative for blurred vision, double vision, photophobia, pain, discharge and redness.  Respiratory: Negative for cough, hemoptysis, sputum production, shortness of breath, wheezing.   Cardiovascular: Negative for chest pain, palpitations, orthopnea, claudication, leg swelling and PND.  Gastrointestinal: Negative for heartburn, nausea, vomiting, +abdominal pain,+ diarrhea,- constipation, blood in stool and melena.  Genitourinary: Negative for dysuria, urgency, frequency, hematuria and flank pain.  Musculoskeletal: +myalgias,- back pain, joint pain and falls.  Skin: Negative for itching and rash.  Neurological: Negative for dizziness, tingling, tremors, sensory change, speech change, focal weakness Endo/Heme/Allergies: Negative for environmental allergies and polydipsia. Does not bruise/bleed easily.   SUBJECTIVE:  Patient states he feels much better  VITAL SIGNS: BP 101/51 mmHg  Pulse 98  Temp(Src) 102.7 F (39.3 C) (Rectal)  Resp 33  Wt 89.812 kg (198 lb)  SpO2 95%  HEMODYNAMICS:    VENTILATOR SETTINGS:    INTAKE / OUTPUT:     PHYSICAL EXAMINATION: General: 79yo AAM who appears stated age and is in some distress. He is AAOx4.  Neuro:  Cranial nerves II-XII grossly intact May have some ptosis of his left eye. PERRL. EOMI. no focal deficits. Gait was not assessed due to critical illness.  HEENT: Head, normocephalic, atraumatic. Ears symmetric, permeable. Eyes: no conjunctival icterus, no erythema. Nose: permeable, midline septum,  Clear oropharynx, Cardiovascular: S1S2 RRR no murmurs, rubs, or gallops auscultated. No thrills palpated.  Lungs:  Chest symmetrical with respirations, clear to auscultation bilaterally with no wheezing, crackles, equal expansion. He is tachypneic Abdomen:  Soft but feels firm with exhalation, nontender, no guarding, nondistended. Present bowel sounds. Musculoskeletal:  ROM intact. Gait was not assessed due to critical illness. No swelling nor tenderness of the joints.  Skin:  Abdominal scar noted, rashes, cruises. No bed sores noted.  Lymphatics: no palpable lymphadenopathy of cervical, supraclvicular nor inguinal areas.    LABS:  BMET  Recent Labs Lab 07/01/15 0345 07/02/15 0500 07/04/15 2325  NA 141 141 145  K 3.3* 3.7 3.4*  CL 108 108 115*  CO2 25 23 19*  BUN 20 17 21*  CREATININE 0.94  0.98 1.38*  GLUCOSE 99 82 106*    Electrolytes  Recent Labs Lab 06/30/15 0445 07/01/15 0345 07/02/15 0500 07/04/15 2325  CALCIUM 6.5* 6.4* 6.3* 6.6*  MG 2.0 2.0 1.9  --   PHOS 2.2* 2.0* 2.0*  --     CBC  Recent Labs Lab 07/01/15 0345 07/02/15 0500 07/04/15 2325  WBC 6.7 8.6 5.4  HGB 7.1* 7.0* 8.1*  HCT 22.3* 21.5* 25.5*  PLT 226 212 252    Coag's No results for input(s): APTT, INR in the last 168 hours.  Sepsis Markers  Recent Labs Lab 07/04/15 2331  LATICACIDVEN 1.72    ABG No results for input(s): PHART, PCO2ART, PO2ART in the last 168 hours.  Liver Enzymes  Recent Labs Lab 06/29/15 0400 07/01/15 0345 07/04/15 2325  AST 26 38 60*  ALT 16* 28 45   ALKPHOS 417* 319* 360*  BILITOT 0.6 0.5 0.8  ALBUMIN 1.8* 1.7* 2.0*    Cardiac Enzymes No results for input(s): TROPONINI, PROBNP in the last 168 hours.  Glucose  Recent Labs Lab 07/03/15 0007 07/03/15 0407 07/03/15 0436 07/03/15 0741 07/03/15 0857 07/03/15 1146  GLUCAP 95 69 74 69 97 101*    Imaging Dg Chest 2 View  07/05/2015  CLINICAL DATA:  Fever, nausea and diarrhea today. History of diabetes, hypertension, tachycardia. EXAM: CHEST  2 VIEW COMPARISON:  CT chest June 05, 2015 at 0118 hours FINDINGS: Cardiomediastinal silhouette is normal for this low inspiratory portable examination with crowded vascular markings. Nipple shadows in lung bases. Small pleural effusions better seen on today's CT chest. Bibasilar atelectasis. No pneumothorax. Diffusely sclerotic axial skeleton compatible with patient's history of metastatic prostate cancer. IMPRESSION: Small pleural effusions and bibasilar atelectasis better seen on today's dedicated CT chest. Electronically Signed   By: Elon Alas M.D.   On: 07/05/2015 02:08   Ct Chest W Contrast  07/05/2015  CLINICAL DATA:  Acute onset of generalized abdominal pain. Fever and diarrhea. Initial encounter. EXAM: CT CHEST, ABDOMEN, AND PELVIS WITH CONTRAST TECHNIQUE: Multidetector CT imaging of the chest, abdomen and pelvis was performed following the standard protocol during bolus administration of intravenous contrast. CONTRAST:  155m OMNIPAQUE IOHEXOL 300 MG/ML  SOLN COMPARISON:  CTA of the chest and CT of the abdomen and pelvis performed 06/19/2015 FINDINGS: CT CHEST Trace bilateral pleural effusions are noted, with mild bibasilar atelectasis. No pneumothorax is seen. No significant focal airspace consolidation is appreciated. No masses are identified. Diffuse coronary artery calcifications are seen. The mediastinum is otherwise unremarkable. No mediastinal lymphadenopathy is appreciated. The great vessels are grossly unremarkable in  appearance. 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 No pericardial effusion is identified. The thyroid gland is unremarkable in appearance. No axillary lymphadenopathy is appreciated. Diffuse sclerotic lesions are noted throughout the visualized osseous structures, reflecting known metastatic prostate cancer. CT ABDOMEN AND PELVIS The liver and spleen are unremarkable in appearance. The gallbladder is within normal limits. The pancreas and adrenal glands are unremarkable. Scattered bilateral renal cysts are seen, measuring up to 3.4 cm in size. There is no evidence of hydronephrosis. No renal or ureteral stones are seen. Mild nonspecific perinephric stranding is noted bilaterally. Contrast progresses into the renal calyces only on repeat delayed images at 11 minutes, concerning for renal insufficiency. No free fluid is identified. The small bowel is unremarkable in appearance. A bowel suture line at the mid abdomen is unremarkable. The stomach is within normal limits. No acute vascular abnormalities are seen.  Minimal calcification is noted along the abdominal aorta and its branches. The appendix is normal in caliber, without evidence of appendicitis. The colon is unremarkable in appearance, aside from mild wall thickening along the rectum, raising question for mild proctitis. Mild nonspecific presacral stranding is noted. The bladder is decompressed and not well assessed. The prostate remains normal in size. No inguinal lymphadenopathy is seen. Diffuse sclerotic lesions are noted throughout the visualized osseous structures, reflecting known metastatic prostate cancer. IMPRESSION: 1. Trace bilateral pleural effusions, with mild bibasilar atelectasis. No definite evidence of pneumonia. 2. Apparent mild wall thickening along the rectum raises question for mild proctitis. Mild nonspecific presacral stranding noted. 3. Delayed excretion of contrast from the kidneys, concerning  for renal insufficiency. 4. Diffuse coronary artery calcifications seen. 5. 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 6. Scattered bilateral renal cysts seen. 7. Diffuse sclerotic lesions throughout the visualized osseous structures, reflecting known metastatic prostate cancer. Electronically Signed   By: Garald Balding M.D.   On: 07/05/2015 01:49   Ct Abdomen Pelvis W Contrast  07/05/2015  CLINICAL DATA:  Acute onset of generalized abdominal pain. Fever and diarrhea. Initial encounter. EXAM: CT CHEST, ABDOMEN, AND PELVIS WITH CONTRAST TECHNIQUE: Multidetector CT imaging of the chest, abdomen and pelvis was performed following the standard protocol during bolus administration of intravenous contrast. CONTRAST:  161m OMNIPAQUE IOHEXOL 300 MG/ML  SOLN COMPARISON:  CTA of the chest and CT of the abdomen and pelvis performed 06/19/2015 FINDINGS: CT CHEST Trace bilateral pleural effusions are noted, with mild bibasilar atelectasis. No pneumothorax is seen. No significant focal airspace consolidation is appreciated. No masses are identified. Diffuse coronary artery calcifications are seen. The mediastinum is otherwise unremarkable. No mediastinal lymphadenopathy is appreciated. The great vessels are grossly unremarkable in appearance. 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 No pericardial effusion is identified. The thyroid gland is unremarkable in appearance. No axillary lymphadenopathy is appreciated. Diffuse sclerotic lesions are noted throughout the visualized osseous structures, reflecting known metastatic prostate cancer. CT ABDOMEN AND PELVIS The liver and spleen are unremarkable in appearance. The gallbladder is within normal limits. The pancreas and adrenal glands are unremarkable. Scattered bilateral renal cysts are seen, measuring up to 3.4 cm in size. There is no evidence of hydronephrosis. No renal or ureteral stones are seen.  Mild nonspecific perinephric stranding is noted bilaterally. Contrast progresses into the renal calyces only on repeat delayed images at 11 minutes, concerning for renal insufficiency. No free fluid is identified. The small bowel is unremarkable in appearance. A bowel suture line at the mid abdomen is unremarkable. The stomach is within normal limits. No acute vascular abnormalities are seen. Minimal calcification is noted along the abdominal aorta and its branches. The appendix is normal in caliber, without evidence of appendicitis. The colon is unremarkable in appearance, aside from mild wall thickening along the rectum, raising question for mild proctitis. Mild nonspecific presacral stranding is noted. The bladder is decompressed and not well assessed. The prostate remains normal in size. No inguinal lymphadenopathy is seen. Diffuse sclerotic lesions are noted throughout the visualized osseous structures, reflecting known metastatic prostate cancer. IMPRESSION: 1. Trace bilateral pleural effusions, with mild bibasilar atelectasis. No definite evidence of pneumonia. 2. Apparent mild wall thickening along the rectum raises question for mild proctitis. Mild nonspecific presacral stranding noted. 3. Delayed excretion of contrast from the kidneys, concerning for renal insufficiency. 4. Diffuse coronary artery calcifications seen. 5. Incidental note of a  retroesophageal course of the right subclavian artery. 6. Scattered bilateral renal cysts seen. 7. Diffuse sclerotic lesions throughout the visualized osseous structures, reflecting known metastatic prostate cancer. Electronically Signed   By: Garald Balding M.D.   On: 07/05/2015 01:49     STUDIES:  C diff positive for Ag and toxin  CULTURES: sent  ANTIBIOTICS: PO vanc and IV flagyl  SIGNIFICANT EVENTS: 79yo AAM admitted for septic shock 2/2 c.diff colitis.   LINES/TUBES: PIV x 2, will place a CVC  DISCUSSION: Care discussed with patient and family  at bedside.   ASSESSMENT / PLAN:  PULMONARY A: no PNA on CT chest No oxygenation/ventilation issues on ABG, ABG unremarkable at 5:50am 7.38/25.5/73.1 P:   Monitor, pt may need intubation if continues to decompensate from a shock standpoint.   CARDIOVASCULAR A: Chronic diastolic heart failure Hx of HTN, HLD P:  Hold antihypertensives as the patient is currently on pressors  RENAL A:  nonanion gap metabolic acidosis likeyl 2/2 NaCl  P:   Attempt to use balanced salt solutions  GASTROINTESTINAL A:  Recent SBO s/p ex-lap small bowel resection - patient does NOT have an acute abdomen Protein caloric malnutrition given recent prolonged hospitalization + albumin 2.0 P:   Monitor, clear liquid diet  HEMATOLOGIC A:  Normocytic anemia, likely of chronic disease, does not appear to have any blood loss.  Patient may end up needed blood transfusion after receiving large amount of IVF with a Hb of 8.1 on admission when he was dry.  P:  monitor  INFECTIOUS A:  Septic shock 2/2 severe c.diff colitis.  - UA pending - CXR and CT chest: unremarkable.  P:  CVC placement, aggressive IVF resuscitation, check CVP, antibiotics PO vanc and IV flagyl. Titrate pressors. Patient may need intubation. F/u UA, blood cultures, check procalcitonin   ENDOCRINE A:  DM2   P:   Started SSI  NEUROLOGIC A:   Toxic metabolic encephalopathy 2/2 sepsis P:  monitor RASS goal: 0    FAMILY  - Updates: daughter and son in law at bedside. Patient is a full code. Patient has known stage IV prostate cancer. Patient discharged yesterday.   - Inter-disciplinary family meet or Palliative Care meeting due by:  day 7  - Critical care time 55 minutes spent bolusing IVF, titrating pressors, reviewing labs/imaging/examining patient, discussion with patient and family. This excludes procedures.   Randa Lynn, MD Critical Care Medicine [place] Pager: [phone]  07/05/2015,  5:51 AM

## 2015-07-05 NOTE — Progress Notes (Addendum)
ICU AM rounds  79 year old with recent admission for small bowel resection secondary to SBO. We admitted from rehabilitation facility with severe septic shock, C. difficile colitis. See H&P from today morning for further details.  Blood pressure 84/39, pulse 92, temperature 102.7 F (39.3 C), temperature source Rectal, resp. rate 30, weight 198 lb (89.812 kg), SpO2 98 %. Awake, mild distress. CVS-sinus tachycardia, no MRG. ROS-mild tachypnea, no wheeze or crackles. Abdomen-distended, diminished bowel sounds, nontender. No guarding or rigidity Extremities-1+ edema.   All labs and imaging personally reviewed  Plan: - Check CVP, Cox - Levophed as needed for BP. Add vasopressin if refractory. - Check cortisol. Stress dose steroids if low. - Start bicarbonate drip at 125 mL/R - Follow LFTs, lactic acid, troponins, lytes - K, mag, phosphorus, calcium repletion - Start PO and rectal Vanco in addition to IV Flagyl. If all other cultures are negative we will DC the IV Vanco and Zosyn. - Probiotics - Low crit noted. No evidence of ongoing bleed. Transfuse 2 units.  - Close monitoring in the ICU. Hoping to avoid intubation.  Daughter updated 12/25  Additional critical care time-30 minutes.  Marshell Garfinkel MD [facility] Pager [phone] If no answer or after 3pm call: [phone] 07/05/2015, 9:23 AM

## 2015-07-05 NOTE — Procedures (Signed)
Central Venous Catheter Insertion Procedure Note Jeremy Jennings IL:[id number] 10/19/1935  Procedure: Insertion of Central Venous Catheter Indications: Assessment of intravascular volume, Drug and/or fluid administration and Frequent blood sampling  Procedure Details Consent: Risks of procedure as well as the alternatives and risks of each were explained to the (patient/caregiver).  Consent for procedure obtained. Time Out: Verified patient identification, verified procedure, site/side was marked, verified correct patient position, special equipment/implants available, medications/allergies/relevent history reviewed, required imaging and test results available.  Performed  Maximum sterile technique was used including antiseptics, cap, gloves, gown, hand hygiene, mask and sheet. Skin prep: Chlorhexidine; local anesthetic administered A antimicrobial bonded/coated triple lumen catheter was placed in the left internal jugular vein using the Seldinger technique.  Evaluation Blood flow good Complications: No apparent complications Patient did tolerate procedure well. Chest X-ray ordered to verify placement.  CXR: pending.  Jeremy Jennings 07/05/2015, 6:40 AM

## 2015-07-05 NOTE — Progress Notes (Signed)
Pharmacy: Re- levophed  Levophed changed to quadruple strength.  Patient's nurse Levada Dy) informed of change in concentration.   Dia Sitter, PharmD, BCPS 07/05/2015 9:49 AM

## 2015-07-05 NOTE — Plan of Care (Signed)
RN notified me of extravasation of norepinephrine in L AC. Pt is asymptomatic, nontender in the area. Erythema has subsided. Will continue to monitor. If worsens, may need phentolamine.   Randa Lynn, MD Critical Care Medicine [place]

## 2015-07-05 NOTE — Consult Note (Signed)
History and Physical  Patient Name: Jeremy Jennings     UJW:[id number]    DOB: 1936/01/12    DOA: 07/04/2015 Referring physician: Everlene Balls, MD PCP: [name], MD      Chief Complaint: Fever  HPI: Jeremy Jennings is a 79 y.o. male with a past medical history significant for metastatic prostate CA, IDDM, HTN, and recent SBO 1 month ago requiring ex-lap complicated by brief cardiac arrest as well as UTI on vanc/Zosyn who presents with fever from nursing home.  The patient had a prolonged ICU hospitalization this month for SBO with necrosed bowel requiring exploratory laparotomy on 12/2 and then followed by sepsis from (probably) UTI or PNA while still inpatient on vancomycin and piperacillin-tazobactam and also brief cardiac arrest. He also did have a blood transfusion while inpatient for GI bleeding (without clear source after EGD). He was discharged on 12/23.  On the day of discharge and yesterday at the rehab facility, he was walking around without walker for short distances, talking and conversational, and close to his previous baseline (he lives independently and still drives).  However, between this morning and this afternoon, the patient's daughter reports that he changed drastically, was lethargic and feverish, complained of new abdominal discomfort/bloating and stomach pain, and had an episode of hypoglycemia, before he was sent from his SNF to the hospital.  In the ED, the patient was febrile to 105F, tachycardic, tachycpneic and with initially soft blood pressure and hypoxic.  He had evidence of AKI and transaminitis.  The lactate was normal, and a CT C/A/P with contrast showed proctitis without other notable findings.  The patient had four watery BMs while in the ED, and a quick scan C diff panel was positive for C diff antigen and toxin both.  3L NS were administered for code SEPSIS as well as IV vancomycin and piperacillin-tazobactam and oral vancomycin.  TRH were called  regarding admission.     Review of Systems:  All other systems negative except as just noted or noted in the history of present illness.  No Known Allergies  Prior to Admission medications   Medication Sig Start Date End Date Taking? Authorizing Provider  abiraterone Acetate (ZYTIGA) 250 MG tablet Take 4 tablets (1,000 mg total) by mouth daily. Take on an empty stomach 1 hour before or 2 hours after a meal 05/22/15  Yes Wyatt Portela, MD  ACCU-CHEK SOFTCLIX LANCETS lancets Use as instructed to check blood sugar 2 times per day dx code E11.65 10/23/14  Yes Elayne Snare, MD  acetaminophen (TYLENOL) 650 MG CR tablet Take 1 tablet (650 mg total) by mouth every 8 (eight) hours as needed for pain. 05/30/15  Yes Florencia Reasons, MD  atorvastatin (LIPITOR) 10 MG tablet take 1 tablet by mouth once daily 07/09/14  Yes Darlin Coco, MD  Blood Glucose Monitoring Suppl (ACCU-CHEK AVIVA PLUS) W/DEVICE KIT Use to check blood sugar 2 times per day dx code E11.65 10/23/14  Yes Elayne Snare, MD  carvedilol (COREG) 12.5 MG tablet Take 1 tablet (12.5 mg total) by mouth 2 (two) times daily with a meal. 08/19/14  Yes Darlin Coco, MD  Cholecalciferol (VITAMIN D PO) Take 5,000 Units by mouth daily. Taking 5000 daily   Yes Historical Provider, MD  COMBIGAN 0.2-0.5 % ophthalmic solution Place 1 drop into the right eye daily.  04/09/13  Yes Historical Provider, MD  diltiazem (TIAZAC) 360 MG 24 hr capsule Take 360 mg by mouth daily.     Yes Historical  Provider, MD  docusate sodium (COLACE) 100 MG capsule Take 100 mg by mouth daily.    Yes Historical Provider, MD  glucose blood (ACCU-CHEK AVIVA PLUS) test strip Use as instructed to check blood sugar 2 times per day dx code E11.65 10/23/14  Yes Elayne Snare, MD  guaiFENesin (ROBITUSSIN) 100 MG/5ML liquid Take 200 mg by mouth 3 (three) times daily as needed for cough.   Yes Historical Provider, MD  insulin aspart (NOVOLOG FLEXPEN) 100 UNIT/ML FlexPen Inject 12 units 3 times per day  with meals 12/05/14  Yes Elayne Snare, MD  Insulin Detemir (LEVEMIR) 100 UNIT/ML Pen Inject 12 Units into the skin daily at 10 pm.   Yes Historical Provider, MD  Insulin Pen Needle (B-D UF III MINI PEN NEEDLES) 31G X 5 MM MISC 1 each by Does not apply route 2 (two) times daily. 04/16/14  Yes Elayne Snare, MD  lactose free nutrition (BOOST PLUS) LIQD Take 237 mLs by mouth 2 (two) times daily between meals. 07/03/15  Yes Saverio Danker, PA-C  Liraglutide 18 MG/3ML SOPN Inject 1.8 mg into the skin daily. 06/09/14  Yes Elayne Snare, MD  megestrol (MEGACE) 400 MG/10ML suspension Take 10 mLs (400 mg total) by mouth 2 (two) times daily. 05/18/15  Yes Wyatt Portela, MD  ondansetron (ZOFRAN) 4 MG tablet Take 1 tablet (4 mg total) by mouth every 8 (eight) hours as needed for nausea or vomiting. 07/03/15  Yes Saverio Danker, PA-C  oxyCODONE-acetaminophen (PERCOCET/ROXICET) 5-325 MG tablet Take 1-2 tablets by mouth every 4 (four) hours as needed for moderate pain or severe pain. 07/03/15  Yes Saverio Danker, PA-C  pioglitazone (ACTOS) 30 MG tablet Take 1 tablet (30 mg total) by mouth daily. 01/19/15  Yes Elayne Snare, MD  prednisoLONE acetate (PRED FORTE) 1 % ophthalmic suspension Place 1 drop into both eyes 2 (two) times daily.  04/25/13  Yes Historical Provider, MD  tamsulosin (FLOMAX) 0.4 MG CAPS capsule Take 0.4 mg by mouth daily.  08/02/13  Yes Historical Provider, MD  vitamin B-12 (CYANOCOBALAMIN) 1000 MCG tablet Take 1,000 mcg by mouth daily.   Yes Historical Provider, MD    Past Medical History  Diagnosis Date  . Essential hypertension   . Type 2 diabetes mellitus ([place])   . Hyperlipidemia   . Prostate cancer ([place])   . History of GI bleed   . Tachycardia   . PAC (premature atrial contraction)     Frequent    Past Surgical History  Procedure Laterality Date  . Tonsillectomy    . Hemorroidectomy  1969  . Esophagogastroduodenoscopy N/A 05/28/2015    Procedure: ESOPHAGOGASTRODUODENOSCOPY (EGD);  Surgeon: [name], MD;  Location: [name];  Service: Endoscopy;  Laterality: N/A;  . Inguinal hernia repair Bilateral AFE 16-17  . Hernia repair  1981    [facility] [place]  . Laparotomy N/A 06/12/2015    Procedure: EXPLORATORY LAPAROTOMY;  Surgeon: [name], MD;  Location: [facility];  Service: General;  Laterality: N/A;  . Bowel resection N/A 06/12/2015    Procedure: SMALL BOWEL RESECTION;  Surgeon: [name], MD;  Location: [facility];  Service: General;  Laterality: N/A;    Family history: family history includes Kidney failure in his brother and sister. There is no history of Cancer.  Social History: Patient lives alone before his SBO.  He still drives.  He walks without a walker at baseline and as recently as yesterday.  He has a remote smoking history.  His wife is deceased and  he had one daughter, who lives in [place].       Physical Exam: BP 101/51 mmHg  Pulse 98  Temp(Src) 102.7 F (39.3 C) (Rectal)  Resp 33  Wt 89.812 kg (198 lb)  SpO2 95% General appearance:Elderly adult male, sluggish and in moderate distress from illness.   Eyes: Anicteric, conjunctiva pink, lids and lashes normal.     ENT: No nasal deformity, discharge, or epistaxis.  OP tacky with dark hairy tongue. Skin: Warm and diaphoretic.  No mottling. Cardiac: Tachycardic, irregular.  No murmurs appreciated.  Trace pretibial edema.  Radial pulses boudning. Respiratory: Increased WOB. CTAB without rales or wheezes. Abdomen: Abdomen protuberant with only mild diffuse TTP, no guarding.   Neuro: Oriented to person, place, month, and year.  Sensorium intact and responding to questions, moslty staring at the ceiling and with eyes closed.  Speech is fluent. Psych: Not able to assess.    Labs on Admission:  The metabolic panel shows mild AKI, mild hypokalemia, low bicarbonate. AST slightly elevated.  Alkaline phosphatse elevated from bone mets. Calcium low. Lactic acid normal. Lipase mildly elevated. The complete  blood count shows anemia, improved from previous.  No leukocytosis.   Radiological Exams on Admission: Personally reviewed: Dg Chest 2 View  07/05/2015  CLINICAL DATA:  Fever, nausea and diarrhea today. History of diabetes, hypertension, tachycardia. EXAM: CHEST  2 VIEW COMPARISON:  CT chest June 05, 2015 at 0118 hours FINDINGS: Cardiomediastinal silhouette is normal for this low inspiratory portable examination with crowded vascular markings. Nipple shadows in lung bases. Small pleural effusions better seen on today's CT chest. Bibasilar atelectasis. No pneumothorax. Diffusely sclerotic axial skeleton compatible with patient's history of metastatic prostate cancer. IMPRESSION: Small pleural effusions and bibasilar atelectasis better seen on today's dedicated CT chest. Electronically Signed   By: Elon Alas M.D.   On: 07/05/2015 02:08   Ct Chest W Contrast  07/05/2015  CLINICAL DATA:  Acute onset of generalized abdominal pain. Fever and diarrhea. Initial encounter. EXAM: CT CHEST, ABDOMEN, AND PELVIS WITH CONTRAST TECHNIQUE: Multidetector CT imaging of the chest, abdomen and pelvis was performed following the standard protocol during bolus administration of intravenous contrast. CONTRAST:  18m OMNIPAQUE IOHEXOL 300 MG/ML  SOLN COMPARISON:  CTA of the chest and CT of the abdomen and pelvis performed 06/19/2015 FINDINGS: CT CHEST Trace bilateral pleural effusions are noted, with mild bibasilar atelectasis. No pneumothorax is seen. No significant focal airspace consolidation is appreciated. No masses are identified. Diffuse coronary artery calcifications are seen. The mediastinum is otherwise unremarkable. No mediastinal lymphadenopathy is appreciated. The great vessels are grossly unremarkable in appearance. 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 No pericardial effusion is identified. The thyroid gland is unremarkable in appearance. No axillary  lymphadenopathy is appreciated. Diffuse sclerotic lesions are noted throughout the visualized osseous structures, reflecting known metastatic prostate cancer. CT ABDOMEN AND PELVIS The liver and spleen are unremarkable in appearance. The gallbladder is within normal limits. The pancreas and adrenal glands are unremarkable. Scattered bilateral renal cysts are seen, measuring up to 3.4 cm in size. There is no evidence of hydronephrosis. No renal or ureteral stones are seen. Mild nonspecific perinephric stranding is noted bilaterally. Contrast progresses into the renal calyces only on repeat delayed images at 11 minutes, concerning for renal insufficiency. No free fluid is identified. The small bowel is unremarkable in appearance. A bowel suture line at the mid abdomen is unremarkable. The stomach is within normal limits. No acute  vascular abnormalities are seen. Minimal calcification is noted along the abdominal aorta and its branches. The appendix is normal in caliber, without evidence of appendicitis. The colon is unremarkable in appearance, aside from mild wall thickening along the rectum, raising question for mild proctitis. Mild nonspecific presacral stranding is noted. The bladder is decompressed and not well assessed. The prostate remains normal in size. No inguinal lymphadenopathy is seen. Diffuse sclerotic lesions are noted throughout the visualized osseous structures, reflecting known metastatic prostate cancer. IMPRESSION: 1. Trace bilateral pleural effusions, with mild bibasilar atelectasis. No definite evidence of pneumonia. 2. Apparent mild wall thickening along the rectum raises question for mild proctitis. Mild nonspecific presacral stranding noted. 3. Delayed excretion of contrast from the kidneys, concerning for renal insufficiency. 4. Diffuse coronary artery calcifications seen. 5. 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 6. Scattered bilateral renal cysts seen. 7.  Diffuse sclerotic lesions throughout the visualized osseous structures, reflecting known metastatic prostate cancer. Electronically Signed   By: Garald Balding M.D.   On: 07/05/2015 01:49   Ct Abdomen Pelvis W Contrast  07/05/2015  CLINICAL DATA:  Acute onset of generalized abdominal pain. Fever and diarrhea. Initial encounter. EXAM: CT CHEST, ABDOMEN, AND PELVIS WITH CONTRAST TECHNIQUE: Multidetector CT imaging of the chest, abdomen and pelvis was performed following the standard protocol during bolus administration of intravenous contrast. CONTRAST:  160m OMNIPAQUE IOHEXOL 300 MG/ML  SOLN COMPARISON:  CTA of the chest and CT of the abdomen and pelvis performed 06/19/2015 FINDINGS: CT CHEST Trace bilateral pleural effusions are noted, with mild bibasilar atelectasis. No pneumothorax is seen. No significant focal airspace consolidation is appreciated. No masses are identified. Diffuse coronary artery calcifications are seen. The mediastinum is otherwise unremarkable. No mediastinal lymphadenopathy is appreciated. The great vessels are grossly unremarkable in appearance. 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 No pericardial effusion is identified. The thyroid gland is unremarkable in appearance. No axillary lymphadenopathy is appreciated. Diffuse sclerotic lesions are noted throughout the visualized osseous structures, reflecting known metastatic prostate cancer. CT ABDOMEN AND PELVIS The liver and spleen are unremarkable in appearance. The gallbladder is within normal limits. The pancreas and adrenal glands are unremarkable. Scattered bilateral renal cysts are seen, measuring up to 3.4 cm in size. There is no evidence of hydronephrosis. No renal or ureteral stones are seen. Mild nonspecific perinephric stranding is noted bilaterally. Contrast progresses into the renal calyces only on repeat delayed images at 11 minutes, concerning for renal insufficiency. No free  fluid is identified. The small bowel is unremarkable in appearance. A bowel suture line at the mid abdomen is unremarkable. The stomach is within normal limits. No acute vascular abnormalities are seen. Minimal calcification is noted along the abdominal aorta and its branches. The appendix is normal in caliber, without evidence of appendicitis. The colon is unremarkable in appearance, aside from mild wall thickening along the rectum, raising question for mild proctitis. Mild nonspecific presacral stranding is noted. The bladder is decompressed and not well assessed. The prostate remains normal in size. No inguinal lymphadenopathy is seen. Diffuse sclerotic lesions are noted throughout the visualized osseous structures, reflecting known metastatic prostate cancer. IMPRESSION: 1. Trace bilateral pleural effusions, with mild bibasilar atelectasis. No definite evidence of pneumonia. 2. Apparent mild wall thickening along the rectum raises question for mild proctitis. Mild nonspecific presacral stranding noted. 3. Delayed excretion of contrast from the kidneys, concerning for renal insufficiency. 4. Diffuse coronary artery calcifications seen. 5.  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 6. Scattered bilateral renal cysts seen. 7. Diffuse sclerotic lesions throughout the visualized osseous structures, reflecting known metastatic prostate cancer. Electronically Signed   By: Garald Balding M.D.   On: 07/05/2015 01:49    EKG: Independently reviewed. Narrow irregular tachycardia, P waves not seen, but poor baseline.  No ST TW changes, rate 109.    Assessment/Plan 1. Septic shock from Cdiff:  This is new.  Patient has abdominal pain, diarrhea, fever, and proctitis on CT without evidence of UTI yet or pneumonia.  Recent abx and hospitalization. -Oral vancomycin ordered -Would continue broad spectrum intravenous antibiotics (Vanc/Zosyn) while culture data are processed -Bolus again  now -Please consult CCM regarding vasopressor support and ICU admission -Follow blood culture, obtain urine culture    2. Prostate Cancer:  Followed by Dr. Alen Blew.  Elevated alk phos from known bone mets.  3. HTN:  Hold antihypertensives  4. AKI:  From septic shock. Fluids and trend BMP  5. Transaminitis:  From septic shock.  6. IDDM:  Hypoglycemic today.  Goal BG 140-180.   Recommend sliding scale insulin  7. Anemia from blood loss:  Stable.  Monitor CBC     Code Status: Full Family Communication: Diagnosis and expected treatment plan were discussed with patient and daughter at bedside.  CODE STATUS was reviewed, and patient reiterated that he would wish life support and CPR (as he had done during last hospitalization) if needed.  Daughter separately expresses concern that his quality of life is poor, deteriorating, and that as his sole decision-maker in the absence of a HCPOA which he does not have, she feels he would not want prolonged efforts at resuscitation, but respects his wish for possible central line and vasopressors now.   Medical decision making: What exists of the patient's previous chart was reviewed in depth and the case was discussed with Dr. Claudine Mouton. Patient seen 4:42 AM on 07/05/2015.  Disposition Plan:  Admit to CCM and ICU.      Edwin Dada [facility] Pager [phone]

## 2015-07-05 NOTE — Progress Notes (Addendum)
Dr. Danise Mina notified of Left [facility] PIV extravasation on arrival to unit from ED. IV site pink and slightly tender per patient. Levophed switched to another IV and central line will be placed by Dr. Danise Mina now. Will continue to monitor.

## 2015-07-06 DIAGNOSIS — A419 Sepsis, unspecified organism: Secondary | ICD-10-CM

## 2015-07-06 DIAGNOSIS — R7989 Other specified abnormal findings of blood chemistry: Secondary | ICD-10-CM

## 2015-07-06 DIAGNOSIS — A047 Enterocolitis due to Clostridium difficile: Secondary | ICD-10-CM

## 2015-07-06 DIAGNOSIS — N179 Acute kidney failure, unspecified: Secondary | ICD-10-CM

## 2015-07-06 LAB — CBC WITH DIFFERENTIAL/PLATELET
BASOS PCT: 0 %
Basophils Absolute: 0 10*3/uL (ref 0.0–0.1)
EOS ABS: 0 10*3/uL (ref 0.0–0.7)
Eosinophils Relative: 0 %
HCT: 24.2 % — ABNORMAL LOW (ref 39.0–52.0)
Hemoglobin: 8.2 g/dL — ABNORMAL LOW (ref 13.0–17.0)
LYMPHS ABS: 0.9 10*3/uL (ref 0.7–4.0)
Lymphocytes Relative: 7 %
MCH: 29.4 pg (ref 26.0–34.0)
MCHC: 33.9 g/dL (ref 30.0–36.0)
MCV: 86.7 fL (ref 78.0–100.0)
MONO ABS: 0.7 10*3/uL (ref 0.1–1.0)
Monocytes Relative: 5 %
NEUTROS ABS: 11.5 10*3/uL — AB (ref 1.7–7.7)
NEUTROS PCT: 88 %
PLATELETS: 205 10*3/uL (ref 150–400)
RBC: 2.79 MIL/uL — ABNORMAL LOW (ref 4.22–5.81)
RDW: 16.9 % — AB (ref 11.5–15.5)
WBC: 13.1 10*3/uL — ABNORMAL HIGH (ref 4.0–10.5)

## 2015-07-06 LAB — GLUCOSE, CAPILLARY
GLUCOSE-CAPILLARY: 118 mg/dL — AB (ref 65–99)
GLUCOSE-CAPILLARY: 125 mg/dL — AB (ref 65–99)
Glucose-Capillary: 112 mg/dL — ABNORMAL HIGH (ref 65–99)
Glucose-Capillary: 157 mg/dL — ABNORMAL HIGH (ref 65–99)
Glucose-Capillary: 147 mg/dL — ABNORMAL HIGH (ref 65–99)

## 2015-07-06 LAB — TYPE AND SCREEN
ABO/RH(D): B POS
Antibody Screen: NEGATIVE
UNIT DIVISION: 0
UNIT DIVISION: 0

## 2015-07-06 LAB — CBC
HCT: 18.6 % — ABNORMAL LOW (ref 39.0–52.0)
HEMOGLOBIN: 6 g/dL — AB (ref 13.0–17.0)
MCH: 29.6 pg (ref 26.0–34.0)
MCHC: 32.3 g/dL (ref 30.0–36.0)
MCV: 91.6 fL (ref 78.0–100.0)
Platelets: 202 10*3/uL (ref 150–400)
RBC: 2.03 MIL/uL — AB (ref 4.22–5.81)
RDW: 17.6 % — ABNORMAL HIGH (ref 11.5–15.5)
WBC: 5.7 10*3/uL (ref 4.0–10.5)

## 2015-07-06 LAB — COMPREHENSIVE METABOLIC PANEL
ALT: 34 U/L (ref 17–63)
AST: 60 U/L — ABNORMAL HIGH (ref 15–41)
Albumin: 2 g/dL — ABNORMAL LOW (ref 3.5–5.0)
Alkaline Phosphatase: 486 U/L — ABNORMAL HIGH (ref 38–126)
Anion gap: 14 (ref 5–15)
BILIRUBIN TOTAL: 0.4 mg/dL (ref 0.3–1.2)
BUN: 23 mg/dL — ABNORMAL HIGH (ref 6–20)
CHLORIDE: 115 mmol/L — AB (ref 101–111)
CO2: 15 mmol/L — ABNORMAL LOW (ref 22–32)
Calcium: 5.8 mg/dL — CL (ref 8.9–10.3)
Creatinine, Ser: 1.53 mg/dL — ABNORMAL HIGH (ref 0.61–1.24)
GFR, EST AFRICAN AMERICAN: 49 mL/min — AB (ref >60)
GFR, EST NON AFRICAN AMERICAN: 42 mL/min — AB (ref >60)
Glucose, Bld: 141 mg/dL — ABNORMAL HIGH (ref 65–99)
POTASSIUM: 4.2 mmol/L (ref 3.5–5.1)
Sodium: 144 mmol/L (ref 135–145)
TOTAL PROTEIN: 5.6 g/dL — AB (ref 6.5–8.1)

## 2015-07-06 LAB — LACTIC ACID, PLASMA: LACTIC ACID, VENOUS: 1.4 mmol/L (ref 0.5–2.0)

## 2015-07-06 LAB — PHOSPHORUS: PHOSPHORUS: 3.4 mg/dL (ref 2.5–4.6)

## 2015-07-06 LAB — MAGNESIUM: MAGNESIUM: 1.6 mg/dL — AB (ref 1.7–2.4)

## 2015-07-06 LAB — PROCALCITONIN: Procalcitonin: 28.74 ng/mL

## 2015-07-06 MED ORDER — MAGNESIUM SULFATE 4 GM/100ML IV SOLN
4.0000 g | Freq: Once | INTRAVENOUS | Status: AC
  Administered 2015-07-06: 4 g via INTRAVENOUS
  Filled 2015-07-06: qty 100

## 2015-07-06 MED ORDER — LACTATED RINGERS IV SOLN
INTRAVENOUS | Status: DC
  Administered 2015-07-06: 75 mL/h via INTRAVENOUS
  Administered 2015-07-07: via INTRAVENOUS

## 2015-07-06 NOTE — Progress Notes (Signed)
CRITICAL VALUE ALERT  Critical value received:  Calcium 5.8  Date of notification:  07/06/2015  Time of notification:  0500   Critical value read back:Yes.    Nurse who received alert:  Rosetta Posner  MD notified (1st page):  Dr. Jimmy Footman  Time of first page:  [phone]  MD notified (2nd page):  Time of second page:  Responding MD:  Dr. Jimmy Footman  Time MD responded:  [phone]

## 2015-07-06 NOTE — Progress Notes (Signed)
eLink Physician-Brief Progress Note Patient Name: Jeremy Jennings DOB: 1935-12-21 MRN: [id number]   Date of Service  07/06/2015  HPI/Events of Note  Persistent hypocalcemia and hypomag  eICU Interventions  Check ionized calcium Replace mag     Intervention Category Intermediate Interventions: Electrolyte abnormality - evaluation and management  DETERDING,ELIZABETH 07/06/2015, 5:08 AM

## 2015-07-06 NOTE — Progress Notes (Signed)
Subjective: Pt with no acute changes.  Off pressors  Objective: Vital signs in last 24 hours: Temp:  [97.6 F (36.4 C)-99.2 F (37.3 C)] 99.2 F (37.3 C) (12/26 0400) Pulse Rate:  [91-105] 103 (12/26 0700) Resp:  [20-40] 27 (12/26 0700) BP: (67-163)/(32-90) 122/56 mmHg (12/26 0700) SpO2:  [96 %-100 %] 100 % (12/26 0700) Weight:  [97.2 kg (214 lb 4.6 oz)] 97.2 kg (214 lb 4.6 oz) (12/26 0417) Last BM Date: 07/05/15  Intake/Output from previous day: 12/25 0701 - 12/26 0700 In: 2744 [I.V.:1674; Blood:670; IV Piggyback:300] Out: 450 [Urine:450] Intake/Output this shift:    General appearance: alert and cooperative Cardio: regular rate and rhythm, S1, S2 normal, no murmur, click, rub or gallop GI: soft, nttp, no rebound/guarding  Lab Results:   Recent Labs  07/05/15 1600 07/06/15 0400  WBC 12.1* 13.1*  HGB 8.7* 8.2*  HCT 26.3* 24.2*  PLT 206 205   BMET  Recent Labs  07/05/15 1600 07/06/15 0400  NA 143 144  K 4.3 4.2  CL 115* 115*  CO2 16* 15*  GLUCOSE 148* 141*  BUN 21* 23*  CREATININE 1.50* 1.53*  CALCIUM 5.8* 5.8*   PT/INR No results for input(s): LABPROT, INR in the last 72 hours. ABG  Recent Labs  07/05/15 0519  PHART 7.386  HCO3 14.9*    Studies/Results: Dg Chest 2 View  07/05/2015  CLINICAL DATA:  Fever, nausea and diarrhea today. History of diabetes, hypertension, tachycardia. EXAM: CHEST  2 VIEW COMPARISON:  CT chest June 05, 2015 at 0118 hours FINDINGS: Cardiomediastinal silhouette is normal for this low inspiratory portable examination with crowded vascular markings. Nipple shadows in lung bases. Small pleural effusions better seen on today's CT chest. Bibasilar atelectasis. No pneumothorax. Diffusely sclerotic axial skeleton compatible with patient's history of metastatic prostate cancer. IMPRESSION: Small pleural effusions and bibasilar atelectasis better seen on today's dedicated CT chest. Electronically Signed   By: Elon Alas  M.D.   On: 07/05/2015 02:08   Ct Chest W Contrast  07/05/2015  CLINICAL DATA:  Acute onset of generalized abdominal pain. Fever and diarrhea. Initial encounter. EXAM: CT CHEST, ABDOMEN, AND PELVIS WITH CONTRAST TECHNIQUE: Multidetector CT imaging of the chest, abdomen and pelvis was performed following the standard protocol during bolus administration of intravenous contrast. CONTRAST:  119mL OMNIPAQUE IOHEXOL 300 MG/ML  SOLN COMPARISON:  CTA of the chest and CT of the abdomen and pelvis performed 06/19/2015 FINDINGS: CT CHEST Trace bilateral pleural effusions are noted, with mild bibasilar atelectasis. No pneumothorax is seen. No significant focal airspace consolidation is appreciated. No masses are identified. Diffuse coronary artery calcifications are seen. The mediastinum is otherwise unremarkable. No mediastinal lymphadenopathy is appreciated. The great vessels are grossly unremarkable in appearance. 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 No pericardial effusion is identified. The thyroid gland is unremarkable in appearance. No axillary lymphadenopathy is appreciated. Diffuse sclerotic lesions are noted throughout the visualized osseous structures, reflecting known metastatic prostate cancer. CT ABDOMEN AND PELVIS The liver and spleen are unremarkable in appearance. The gallbladder is within normal limits. The pancreas and adrenal glands are unremarkable. Scattered bilateral renal cysts are seen, measuring up to 3.4 cm in size. There is no evidence of hydronephrosis. No renal or ureteral stones are seen. Mild nonspecific perinephric stranding is noted bilaterally. Contrast progresses into the renal calyces only on repeat delayed images at 11 minutes, concerning for renal insufficiency. No free fluid is identified. The small bowel is unremarkable  in appearance. A bowel suture line at the mid abdomen is unremarkable. The stomach is within normal limits. No acute  vascular abnormalities are seen. Minimal calcification is noted along the abdominal aorta and its branches. The appendix is normal in caliber, without evidence of appendicitis. The colon is unremarkable in appearance, aside from mild wall thickening along the rectum, raising question for mild proctitis. Mild nonspecific presacral stranding is noted. The bladder is decompressed and not well assessed. The prostate remains normal in size. No inguinal lymphadenopathy is seen. Diffuse sclerotic lesions are noted throughout the visualized osseous structures, reflecting known metastatic prostate cancer. IMPRESSION: 1. Trace bilateral pleural effusions, with mild bibasilar atelectasis. No definite evidence of pneumonia. 2. Apparent mild wall thickening along the rectum raises question for mild proctitis. Mild nonspecific presacral stranding noted. 3. Delayed excretion of contrast from the kidneys, concerning for renal insufficiency. 4. Diffuse coronary artery calcifications seen. 5. 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 6. Scattered bilateral renal cysts seen. 7. Diffuse sclerotic lesions throughout the visualized osseous structures, reflecting known metastatic prostate cancer. Electronically Signed   By: Garald Balding M.D.   On: 07/05/2015 01:49   Ct Abdomen Pelvis W Contrast  07/05/2015  CLINICAL DATA:  Acute onset of generalized abdominal pain. Fever and diarrhea. Initial encounter. EXAM: CT CHEST, ABDOMEN, AND PELVIS WITH CONTRAST TECHNIQUE: Multidetector CT imaging of the chest, abdomen and pelvis was performed following the standard protocol during bolus administration of intravenous contrast. CONTRAST:  14mL OMNIPAQUE IOHEXOL 300 MG/ML  SOLN COMPARISON:  CTA of the chest and CT of the abdomen and pelvis performed 06/19/2015 FINDINGS: CT CHEST Trace bilateral pleural effusions are noted, with mild bibasilar atelectasis. No pneumothorax is seen. No significant focal airspace  consolidation is appreciated. No masses are identified. Diffuse coronary artery calcifications are seen. The mediastinum is otherwise unremarkable. No mediastinal lymphadenopathy is appreciated. The great vessels are grossly unremarkable in appearance. 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 No pericardial effusion is identified. The thyroid gland is unremarkable in appearance. No axillary lymphadenopathy is appreciated. Diffuse sclerotic lesions are noted throughout the visualized osseous structures, reflecting known metastatic prostate cancer. CT ABDOMEN AND PELVIS The liver and spleen are unremarkable in appearance. The gallbladder is within normal limits. The pancreas and adrenal glands are unremarkable. Scattered bilateral renal cysts are seen, measuring up to 3.4 cm in size. There is no evidence of hydronephrosis. No renal or ureteral stones are seen. Mild nonspecific perinephric stranding is noted bilaterally. Contrast progresses into the renal calyces only on repeat delayed images at 11 minutes, concerning for renal insufficiency. No free fluid is identified. The small bowel is unremarkable in appearance. A bowel suture line at the mid abdomen is unremarkable. The stomach is within normal limits. No acute vascular abnormalities are seen. Minimal calcification is noted along the abdominal aorta and its branches. The appendix is normal in caliber, without evidence of appendicitis. The colon is unremarkable in appearance, aside from mild wall thickening along the rectum, raising question for mild proctitis. Mild nonspecific presacral stranding is noted. The bladder is decompressed and not well assessed. The prostate remains normal in size. No inguinal lymphadenopathy is seen. Diffuse sclerotic lesions are noted throughout the visualized osseous structures, reflecting known metastatic prostate cancer. IMPRESSION: 1. Trace bilateral pleural effusions, with mild  bibasilar atelectasis. No definite evidence of pneumonia. 2. Apparent mild wall thickening along the rectum raises question for mild proctitis. Mild nonspecific presacral  stranding noted. 3. Delayed excretion of contrast from the kidneys, concerning for renal insufficiency. 4. Diffuse coronary artery calcifications seen. 5. 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 6. Scattered bilateral renal cysts seen. 7. Diffuse sclerotic lesions throughout the visualized osseous structures, reflecting known metastatic prostate cancer. Electronically Signed   By: Garald Balding M.D.   On: 07/05/2015 01:49   Dg Chest Port 1 View  07/05/2015  CLINICAL DATA:  Acute respiratory failure. History of prostate cancer. EXAM: PORTABLE CHEST 1 VIEW COMPARISON:  Single view of the chest and PA, lateral chest and CT chest earlier today. FINDINGS: Left IJ catheter is unchanged. Small bilateral pleural effusions and subsegmental atelectasis left base appearing chain. No pneumothorax. IMPRESSION: No change in left basilar atelectasis and small bilateral pleural effusions. Electronically Signed   By: Inge Rise M.D.   On: 07/05/2015 14:37   Dg Chest Port 1 View  07/05/2015  CLINICAL DATA:  Central line placement EXAM: PORTABLE CHEST 1 VIEW COMPARISON:  July 05, 2015 1:37 a.m. FINDINGS: The heart size and mediastinal contours are stable. Left jugular central venous line is identified with distal tip in superior vena cava. There is no pneumothorax. There are are mild atelectasis of both lung bases with minimal left pleural effusion. The visualized skeletal structures are stable. IMPRESSION: Left central venous line distal tip in the superior vena cava. There is no pneumothorax. Mild atelectasis of both lung bases with minimal left pleural effusion. Electronically Signed   By: Abelardo Diesel M.D.   On: 07/05/2015 07:26    Anti-infectives: Anti-infectives    Start     Dose/Rate Route Frequency  Ordered Stop   07/05/15 1200  metroNIDAZOLE (FLAGYL) IVPB 500 mg     500 mg 100 mL/hr over 60 Minutes Intravenous Every 6 hours 07/05/15 0439     07/05/15 1200  vancomycin (VANCOCIN) 500 mg in sodium chloride irrigation 0.9 % 100 mL ENEMA     500 mg Rectal 4 times per day 07/05/15 0919     07/05/15 1000  vancomycin (VANCOCIN) 50 mg/mL oral solution 500 mg     500 mg Oral 4 times per day 07/05/15 0919     07/05/15 0600  vancomycin (VANCOCIN) 50 mg/mL oral solution 125 mg  Status:  Discontinued     125 mg Oral 4 times per day 07/05/15 0350 07/05/15 0937   07/05/15 0430  metroNIDAZOLE (FLAGYL) IVPB 500 mg  Status:  Discontinued     500 mg 100 mL/hr over 60 Minutes Intravenous Every 8 hours 07/05/15 0427 07/05/15 0439   07/04/15 2330  piperacillin-tazobactam (ZOSYN) IVPB 3.375 g     3.375 g 100 mL/hr over 30 Minutes Intravenous  Once 07/04/15 2320 07/05/15 0020   07/04/15 2330  vancomycin (VANCOCIN) 1,500 mg in sodium chloride 0.9 % 500 mL IVPB     1,500 mg 250 mL/hr over 120 Minutes Intravenous  Once 07/04/15 2320 07/05/15 0047      Assessment/Plan: Cdiff colitis Sepsis 1. Con't supportive care. PO Vanc/flagyl 2. Abd exam con't to benign.  Will con't to follow abd exams.     LOS: 1 day    Rosario Jacks., Anne Hahn 07/06/2015

## 2015-07-06 NOTE — Progress Notes (Signed)
PULMONARY / CRITICAL CARE MEDICINE   Name: Jeremy Jennings MRN: [id number] DOB: 07/05/1936    ADMISSION DATE:  07/04/2015 CONSULTATION DATE:  07/05/15  REFERRING MD:  Claudine Mouton  CHIEF COMPLAINT:  fever  HISTORY OF PRESENT ILLNESS:   Jeremy Jennings is a 79 y.o. male with PMHx listed below who presents from NH with complaints of fever and diarrhea. He was recently hospitalized for SBO requiring ex-lap complicated by brief cardiac arrest as well as UTI on vanc/Zosyn. He was discharged  12/24.   SUBJECTIVE:  Patient states he feels much better  VITAL SIGNS: BP 119/56 mmHg  Pulse 102  Temp(Src) 98.6 F (37 C) (Axillary)  Resp 21  Wt 97.2 kg (214 lb 4.6 oz)  SpO2 100%  HEMODYNAMICS: CVP:  [12 mmHg-25 mmHg] 12 mmHg     INTAKE / OUTPUT: I/O last 3 completed shifts: In: 4912.8 [I.V.:3842.8; Blood:670; Other:100; IV Piggyback:300] Out: 450 [Urine:450]  PHYSICAL EXAMINATION: General: 79yo AAM in no distress.  Neuro:  Cranial nerves II-XII grossly intact. No focal deficits. Gait was not assessed due to critical illness.  HEENT: normocephalic, atraumati Clear oropharynx, Cardiovascular: S1S2 RRR no murmurs, rubs, or gallops auscultated. No thrills palpated.  Lungs:  Chest symmetrical with respirations, clear to auscultation bilaterally  Abdomen:  Soft but feels firm with exhalation, nontender, no guarding, nondistended. Present bowel sounds. Musculoskeletal:  ROM intact. Gait was not assessed due to critical illness. No swelling nor tenderness of the joints.  Skin:  Abdominal scar noted, rashes, cruises. No bed sores noted.  Lymphatics: no palpable lymphadenopathy of cervical, supraclvicular nor inguinal areas.    LABS:  BMET  Recent Labs Lab 07/05/15 0810 07/05/15 1600 07/06/15 0400  NA 142 143 144  K 2.7* 4.3 4.2  CL 117* 115* 115*  CO2 16* 16* 15*  BUN 19 21* 23*  CREATININE 1.45* 1.50* 1.53*  GLUCOSE 113* 148* 141*    Electrolytes  Recent Labs Lab  07/05/15 0810 07/05/15 1600 07/06/15 0400  CALCIUM 5.6* 5.8* 5.8*  MG 1.5* 1.6* 1.6*  PHOS 2.1* 3.8 3.4    CBC  Recent Labs Lab 07/05/15 0810 07/05/15 0955 07/05/15 1600 07/06/15 0400  WBC 5.7  --  12.1* 13.1*  HGB 6.0* 6.0* 8.7* 8.2*  HCT 18.6* 19.1* 26.3* 24.2*  PLT 202  --  206 205    Coag's No results for input(s): APTT, INR in the last 168 hours.  Sepsis Markers  Recent Labs Lab 07/05/15 0532 07/05/15 0810 07/05/15 0955 07/05/15 2200 07/06/15 0400  LATICACIDVEN 1.30  --   --  2.1* 1.4  PROCALCITON  --  21.32 22.98  --  28.74    ABG  Recent Labs Lab 07/05/15 0519  PHART 7.386  PCO2ART 25.3*  PO2ART 72.4*    Liver Enzymes  Recent Labs Lab 07/01/15 0345 07/04/15 2325 07/06/15 0400  AST 38 60* 60*  ALT 28 45 34  ALKPHOS 319* 360* 486*  BILITOT 0.5 0.8 0.4  ALBUMIN 1.7* 2.0* 2.0*    Cardiac Enzymes  Recent Labs Lab 07/05/15 0955 07/05/15 1457 07/05/15 2100  TROPONINI 0.11* 0.12* 0.16*    Glucose  Recent Labs Lab 07/05/15 1219 07/05/15 1614 07/05/15 2020 07/06/15 0001 07/06/15 0326 07/06/15 0817  GLUCAP 148* 166* 131* 118* 125* 112*    Imaging Dg Chest Port 1 View  07/05/2015  CLINICAL DATA:  Acute respiratory failure. History of prostate cancer. EXAM: PORTABLE CHEST 1 VIEW COMPARISON:  Single view of the chest and PA, lateral chest and  CT chest earlier today. FINDINGS: Left IJ catheter is unchanged. Small bilateral pleural effusions and subsegmental atelectasis left base appearing chain. No pneumothorax. IMPRESSION: No change in left basilar atelectasis and small bilateral pleural effusions. Electronically Signed   By: Inge Rise M.D.   On: 07/05/2015 14:37     STUDIES:  C diff positive for Ag and toxin  CULTURES: sent  ANTIBIOTICS: PO vanc and IV flagyl  SIGNIFICANT EVENTS: 79yo AAM admitted for septic shock 2/2 c.diff colitis   LINES/TUBES: Left IJ CVL 12/25>>>  DISCUSSION: 79 year old male recently  d/c to SNF s/p hospitalization for SBO requiring expl lap. Re-admitted w/ septic shock/MODS on 12/24 d/t Cdiff. Now shock resolved, no pain, has several electrolyte imbalances and mild troponin bump but otherwise stable. We will cont current abx (oral vanc and flagyl), change IVF to LR, mobilize and also make cards aware that he is still here. Do not think that the trop bump represents acute cardiac event but it is interesting that after his PEA last admit he had no trop rise and this time he does.   ASSESSMENT / PLAN:  PULMONARY A: Hypoxia in setting of left basilar atelectasis  P: pulm hygiene Wean O2 Mobilize   CARDIOVASCULAR A:  Septic shock-->now off pressors  Chronic diastolic heart failure Mild troponin bump-->this was a little higher than after his recent PEA arrest during last admit  Hx of HTN, HLD P:  Add back coreg in am 12/26 if remains hemodynamically stable & off pressors Ask cards to continue to follow. Don't think trop rise represents ischemia but don't want him to fall thru the cracks.   RENAL A:   nonanion gap metabolic acidosis likeyl 2/2 NaCl AKI Hypomagnesemia  Peripheral edema P:   Change IVFs to LR Replace Mg  Renal dose meds Strict I&O F/u am chem  Assess 12/27 re: adding back lasix   GASTROINTESTINAL A:   Recent SBO s/p ex-lap small bowel resection - patient does NOT have an acute abdomen c diff colitis  Protein caloric malnutrition given recent prolonged hospitalization + albumin 2.0 P:   Clear liq diet only w/ slow progression  See ID section   HEMATOLOGIC A:   Normocytic anemia, likely of chronic disease, does not appear to have any blood loss. He is s/p 2 units after admit  P:  Monitor Trend CBC PAS-->if hgb stable for another 24 hrs can add back [place] heparin   INFECTIOUS A:   Septic shock 2/2 severe c.diff colitis.  P:   Cont vanc and flagyl (minimum 14d course from 12/24)  ENDOCRINE A:   DM2 P:   SSI  NEUROLOGIC A:    Toxic metabolic encephalopathy 2/2 sepsis-->resolved  P:   monitor RASS goal: 0 Supportive care     FAMILY  - Updates: daughter and son in law at bedside. Patient is a full code. Patient has known stage IV prostate cancer. Patient discharged yesterday.   - Inter-disciplinary family meet or Palliative Care meeting due by:  day [place] ACNP-BC [place] Pager # [phone] OR # [phone] if no answer 45 minutes ccm time   07/06/2015, 10:06 AM

## 2015-07-06 NOTE — Progress Notes (Signed)
Initial Nutrition Assessment  DOCUMENTATION CODES:   Not applicable  INTERVENTION:  -Diet Advancement per MD -RD Continue to monitor for needs   NUTRITION DIAGNOSIS:   Inadequate oral intake related to inability to eat as evidenced by NPO status.  GOAL:   Patient will meet greater than or equal to 90% of their needs  MONITOR:   PO intake, Supplement acceptance, Weight trends, I & O's, Labs, Skin  REASON FOR ASSESSMENT:   Malnutrition Screening Tool    ASSESSMENT:   Jeremy Jennings is a 79 y.o. male with PMHx listed below who presents from NH with complaints of fever and diarrhea. He was recently hospitalized for SBO requiring ex-lap complicated by brief cardiac arrest as well as UTI on vanc/Zosyn. He was discharged yesterday.  At the rehab facility, he was ambulating around without walker for short distances, talking and conversational, and close to his previous baseline (he lives independently and still drives). This morning and afternoon, per pts daughter, there was a clinical change where he was lethargic and febrile with abominal pain and distension. He was subsequently sent from SNF to the hospital where he was noted to have a T of 105F, tachycardic, tachycpneic and with initially soft blood pressure and hypoxic. The patient had four watery BMs while in the ED, and was found to be c.diff +.He received 4L IVF + antibiotics.  Pt, daughter, son-in-law at bedside. Pt is well known to RD, from previous stay. Current fever, C-Diff + has hurt pt's appetite. Currently NPO, awaiting MD workup.  Nutrition-Focused physical exam completed. Findings are no fat depletion, no muscle depletion, and mild edema.   Labs: Cl 115, BUN 23, Cr 1.53, Mg 1.6, Alk Phos 486  Pt was on Levophed, Pitressin when first admitted.   Diet Order:  Diet NPO time specified  Skin:  Wound (see comment) (Closed incision to abdomen.)  Last BM:  07/06/2015  Height:   Ht Readings from Last 1  Encounters:  06/23/15 6' (1.829 m)    Weight:   Wt Readings from Last 1 Encounters:  07/06/15 214 lb 4.6 oz (97.2 kg)    Ideal Body Weight:  80.91 kg  BMI:  Body mass index is 29.06 kg/(m^2).  Estimated Nutritional Needs:   Kcal:  2000-2200  Protein:  100-110g  Fluid:  2L/day  EDUCATION NEEDS:   No education needs identified at this time  Satira Anis. Sherae Santino, MS, RD LDN After Hours/Weekend Pager [phone]

## 2015-07-06 NOTE — Progress Notes (Signed)
CRITICAL VALUE ALERT  Critical value received:  Calcium 5.8, Lactic 2.1  Date of notification:  07/05/2015   Time of notification:  2315  Critical value read back:Yes.    Nurse who received alert:  Rosetta Posner  MD notified (1st page):  Dr. Jimmy Footman   Time of first page:  2320  MD notified (2nd page):   Time of second page:  Responding MD:  Passed critical values onto [facility] nurse.  Time MD responded:  2330

## 2015-07-07 ENCOUNTER — Inpatient Hospital Stay (HOSPITAL_COMMUNITY): Payer: Medicare Other

## 2015-07-07 DIAGNOSIS — I10 Essential (primary) hypertension: Secondary | ICD-10-CM

## 2015-07-07 DIAGNOSIS — R6521 Severe sepsis with septic shock: Secondary | ICD-10-CM

## 2015-07-07 DIAGNOSIS — E785 Hyperlipidemia, unspecified: Secondary | ICD-10-CM

## 2015-07-07 LAB — COMPREHENSIVE METABOLIC PANEL
ALK PHOS: 494 U/L — AB (ref 38–126)
ALT: 25 U/L (ref 17–63)
ANION GAP: 14 (ref 5–15)
AST: 25 U/L (ref 15–41)
Albumin: 1.8 g/dL — ABNORMAL LOW (ref 3.5–5.0)
BUN: 27 mg/dL — ABNORMAL HIGH (ref 6–20)
CALCIUM: 5.7 mg/dL — AB (ref 8.9–10.3)
CO2: 15 mmol/L — AB (ref 22–32)
CREATININE: 1.46 mg/dL — AB (ref 0.61–1.24)
Chloride: 117 mmol/L — ABNORMAL HIGH (ref 101–111)
GFR, EST AFRICAN AMERICAN: 52 mL/min — AB (ref >60)
GFR, EST NON AFRICAN AMERICAN: 45 mL/min — AB (ref >60)
Glucose, Bld: 137 mg/dL — ABNORMAL HIGH (ref 65–99)
Potassium: 3.2 mmol/L — ABNORMAL LOW (ref 3.5–5.1)
SODIUM: 146 mmol/L — AB (ref 135–145)
Total Bilirubin: 0.6 mg/dL (ref 0.3–1.2)
Total Protein: 5.6 g/dL — ABNORMAL LOW (ref 6.5–8.1)

## 2015-07-07 LAB — POCT I-STAT, CHEM 8
BUN: 23 mg/dL — AB (ref 6–20)
CALCIUM ION: 0.82 mmol/L — AB (ref 1.13–1.30)
CHLORIDE: 115 mmol/L — AB (ref 101–111)
CREATININE: 1.2 mg/dL (ref 0.61–1.24)
GLUCOSE: 139 mg/dL — AB (ref 65–99)
HCT: 28 % — ABNORMAL LOW (ref 39.0–52.0)
Hemoglobin: 9.5 g/dL — ABNORMAL LOW (ref 13.0–17.0)
Potassium: 2.9 mmol/L — ABNORMAL LOW (ref 3.5–5.1)
Sodium: 147 mmol/L — ABNORMAL HIGH (ref 135–145)
TCO2: 14 mmol/L (ref 0–100)

## 2015-07-07 LAB — URINE MICROSCOPIC-ADD ON

## 2015-07-07 LAB — GLUCOSE, CAPILLARY
GLUCOSE-CAPILLARY: 134 mg/dL — AB (ref 65–99)
GLUCOSE-CAPILLARY: 135 mg/dL — AB (ref 65–99)
GLUCOSE-CAPILLARY: 135 mg/dL — AB (ref 65–99)
Glucose-Capillary: 118 mg/dL — ABNORMAL HIGH (ref 65–99)
Glucose-Capillary: 139 mg/dL — ABNORMAL HIGH (ref 65–99)
Glucose-Capillary: 142 mg/dL — ABNORMAL HIGH (ref 65–99)
Glucose-Capillary: 160 mg/dL — ABNORMAL HIGH (ref 65–99)

## 2015-07-07 LAB — URINALYSIS, ROUTINE W REFLEX MICROSCOPIC
Bilirubin Urine: NEGATIVE
Glucose, UA: NEGATIVE mg/dL
HGB URINE DIPSTICK: NEGATIVE
Ketones, ur: NEGATIVE mg/dL
LEUKOCYTES UA: NEGATIVE
Nitrite: NEGATIVE
Protein, ur: 30 mg/dL — AB
SPECIFIC GRAVITY, URINE: 1.015 (ref 1.005–1.030)
pH: 6 (ref 5.0–8.0)

## 2015-07-07 LAB — CALCIUM, IONIZED
CALCIUM, IONIZED, SERUM: 3.2 mg/dL — AB (ref 4.5–5.6)
CALCIUM, IONIZED, SERUM: 3.4 mg/dL — AB (ref 4.5–5.6)

## 2015-07-07 LAB — CBC
HCT: 25.3 % — ABNORMAL LOW (ref 39.0–52.0)
HEMOGLOBIN: 8.6 g/dL — AB (ref 13.0–17.0)
MCH: 29.2 pg (ref 26.0–34.0)
MCHC: 34 g/dL (ref 30.0–36.0)
MCV: 85.8 fL (ref 78.0–100.0)
PLATELETS: 206 10*3/uL (ref 150–400)
RBC: 2.95 MIL/uL — ABNORMAL LOW (ref 4.22–5.81)
RDW: 17.2 % — ABNORMAL HIGH (ref 11.5–15.5)
WBC: 15.4 10*3/uL — ABNORMAL HIGH (ref 4.0–10.5)

## 2015-07-07 LAB — CG4 I-STAT (LACTIC ACID): LACTIC ACID, VENOUS: 0.98 mmol/L (ref 0.5–2.0)

## 2015-07-07 LAB — PROCALCITONIN: Procalcitonin: 14.1 ng/mL

## 2015-07-07 MED ORDER — ATORVASTATIN CALCIUM 10 MG PO TABS
10.0000 mg | ORAL_TABLET | Freq: Every day | ORAL | Status: DC
  Administered 2015-07-07 – 2015-07-16 (×10): 10 mg via ORAL
  Filled 2015-07-07 (×11): qty 1

## 2015-07-07 MED ORDER — TAMSULOSIN HCL 0.4 MG PO CAPS
0.4000 mg | ORAL_CAPSULE | Freq: Every day | ORAL | Status: DC
  Administered 2015-07-07 – 2015-07-17 (×11): 0.4 mg via ORAL
  Filled 2015-07-07 (×11): qty 1

## 2015-07-07 MED ORDER — HEPARIN SODIUM (PORCINE) 5000 UNIT/ML IJ SOLN
5000.0000 [IU] | Freq: Three times a day (TID) | INTRAMUSCULAR | Status: DC
  Administered 2015-07-07 – 2015-07-17 (×31): 5000 [IU] via SUBCUTANEOUS
  Filled 2015-07-07 (×33): qty 1

## 2015-07-07 MED ORDER — PREDNISOLONE ACETATE 1 % OP SUSP
1.0000 [drp] | Freq: Two times a day (BID) | OPHTHALMIC | Status: DC
  Administered 2015-07-07 – 2015-07-17 (×21): 1 [drp] via OPHTHALMIC
  Filled 2015-07-07: qty 1

## 2015-07-07 MED ORDER — CARVEDILOL 3.125 MG PO TABS
3.1250 mg | ORAL_TABLET | Freq: Two times a day (BID) | ORAL | Status: DC
  Administered 2015-07-07 – 2015-07-08 (×2): 3.125 mg via ORAL
  Filled 2015-07-07 (×2): qty 1

## 2015-07-07 MED ORDER — INSULIN ASPART 100 UNIT/ML ~~LOC~~ SOLN
0.0000 [IU] | Freq: Every day | SUBCUTANEOUS | Status: DC
  Administered 2015-07-11: 2 [IU] via SUBCUTANEOUS

## 2015-07-07 MED ORDER — METRONIDAZOLE 500 MG PO TABS
500.0000 mg | ORAL_TABLET | Freq: Three times a day (TID) | ORAL | Status: DC
  Administered 2015-07-07 – 2015-07-17 (×31): 500 mg via ORAL
  Filled 2015-07-07 (×33): qty 1

## 2015-07-07 MED ORDER — POTASSIUM CHLORIDE 10 MEQ/50ML IV SOLN
10.0000 meq | INTRAVENOUS | Status: AC
  Administered 2015-07-07 (×4): 10 meq via INTRAVENOUS
  Filled 2015-07-07 (×4): qty 50

## 2015-07-07 MED ORDER — TIMOLOL MALEATE 0.5 % OP SOLN
1.0000 [drp] | Freq: Every day | OPHTHALMIC | Status: DC
  Administered 2015-07-07 – 2015-07-17 (×11): 1 [drp] via OPHTHALMIC
  Filled 2015-07-07 (×2): qty 5

## 2015-07-07 MED ORDER — BRIMONIDINE TARTRATE 0.2 % OP SOLN
1.0000 [drp] | Freq: Every day | OPHTHALMIC | Status: DC
  Administered 2015-07-07 – 2015-07-17 (×11): 1 [drp] via OPHTHALMIC
  Filled 2015-07-07: qty 5

## 2015-07-07 MED ORDER — INSULIN ASPART 100 UNIT/ML ~~LOC~~ SOLN
0.0000 [IU] | Freq: Three times a day (TID) | SUBCUTANEOUS | Status: DC
  Administered 2015-07-07 – 2015-07-09 (×5): 2 [IU] via SUBCUTANEOUS
  Administered 2015-07-09: 3 [IU] via SUBCUTANEOUS
  Administered 2015-07-09 – 2015-07-10 (×4): 2 [IU] via SUBCUTANEOUS
  Administered 2015-07-11: 3 [IU] via SUBCUTANEOUS
  Administered 2015-07-11: 2 [IU] via SUBCUTANEOUS
  Administered 2015-07-11 – 2015-07-12 (×4): 1 [IU] via SUBCUTANEOUS
  Administered 2015-07-13: 2 [IU] via SUBCUTANEOUS
  Administered 2015-07-13: 1 [IU] via SUBCUTANEOUS
  Administered 2015-07-14 (×2): 2 [IU] via SUBCUTANEOUS
  Administered 2015-07-14: 1 [IU] via SUBCUTANEOUS
  Administered 2015-07-16 – 2015-07-17 (×2): 2 [IU] via SUBCUTANEOUS

## 2015-07-07 NOTE — Telephone Encounter (Signed)
Pt currently admitted.  Will follow up tomorrow, 12/28.

## 2015-07-07 NOTE — Progress Notes (Signed)
Patient ID: Jeremy Jennings, male   DOB: 01/20/1936, 79 y.o.   MRN: [id number]    Subjective: Patient looks great, better than even on Friday when he was discharged.  Having diarrhea with flexi-seal in place.  Otherwise no abdominal pain.  Objective: Vital signs in last 24 hours: Temp:  [97.5 F (36.4 C)-98.6 F (37 C)] 98.6 F (37 C) (12/27 0800) Pulse Rate:  [96-107] 99 (12/27 0600) Resp:  [18-30] 18 (12/27 0600) BP: (110-155)/(54-87) 120/69 mmHg (12/27 0600) SpO2:  [90 %-100 %] 100 % (12/27 0600) Weight:  [98 kg (216 lb 0.8 oz)] 98 kg (216 lb 0.8 oz) (12/27 0600) Last BM Date: 07/06/15  Intake/Output from previous day: 12/26 0701 - 12/27 0700 In: 1931.3 [I.V.:1431.3; IV Piggyback:500] Out: 650 [Urine:650] Intake/Output this shift:    PE: Abd: softer than last I saw him, nontender, ND, +BS, midline wound is healing well.  flexi-seal in place with pure liquid stool.  Lab Results:   Recent Labs  07/06/15 0400 07/07/15 0500  WBC 13.1* 15.4*  HGB 8.2* 8.6*  HCT 24.2* 25.3*  PLT 205 206   BMET  Recent Labs  07/06/15 0400 07/07/15 0500  NA 144 146*  K 4.2 3.2*  CL 115* 117*  CO2 15* 15*  GLUCOSE 141* 137*  BUN 23* 27*  CREATININE 1.53* 1.46*  CALCIUM 5.8* 5.7*   PT/INR No results for input(s): LABPROT, INR in the last 72 hours. CMP     Component Value Date/Time   NA 146* 07/07/2015 0500   NA 142 05/18/2015 0838   K 3.2* 07/07/2015 0500   K 3.5 05/18/2015 0838   CL 117* 07/07/2015 0500   CO2 15* 07/07/2015 0500   CO2 18* 05/18/2015 0838   GLUCOSE 137* 07/07/2015 0500   GLUCOSE 84 05/18/2015 0838   BUN 27* 07/07/2015 0500   BUN 18.3 05/18/2015 0838   CREATININE 1.46* 07/07/2015 0500   CREATININE 0.8 05/18/2015 0838   CALCIUM 5.7* 07/07/2015 0500   CALCIUM 8.4 05/18/2015 0838   PROT 5.6* 07/07/2015 0500   PROT 6.5 05/18/2015 0838   ALBUMIN 1.8* 07/07/2015 0500   ALBUMIN 2.4* 05/18/2015 0838   AST 25 07/07/2015 0500   AST 18 05/18/2015 0838   ALT  25 07/07/2015 0500   ALT <9 05/18/2015 0838   ALKPHOS 494* 07/07/2015 0500   ALKPHOS 488* 05/18/2015 0838   BILITOT 0.6 07/07/2015 0500   BILITOT 0.33 05/18/2015 0838   GFRNONAA 45* 07/07/2015 0500   GFRAA 52* 07/07/2015 0500   Lipase     Component Value Date/Time   LIPASE 52* 07/04/2015 2325       Studies/Results: Dg Chest Port 1 View  07/07/2015  CLINICAL DATA:  79 year old male with respiratory failure. Initial encounter. Metastatic prostate cancer. EXAM: PORTABLE CHEST 1 VIEW COMPARISON:  07/05/2015 and earlier. FINDINGS: Portable AP semi upright view at at 0440 hours. Stable left IJ central line. Continued confluent platelike opacity at the left lung base. Atelectasis along the right minor fissure has decreased. Small bilateral pleural effusions were more apparent on the 07/05/2015 CT. Pulmonary vascularity is stable. Stable cardiac size and mediastinal contours. IMPRESSION: Overall stable ventilation since 07/05/2015. Left greater than right pulmonary atelectasis and small pleural effusions. Electronically Signed   By: Genevie Ann M.D.   On: 07/07/2015 07:02   Dg Chest Port 1 View  07/05/2015  CLINICAL DATA:  Acute respiratory failure. History of prostate cancer. EXAM: PORTABLE CHEST 1 VIEW COMPARISON:  Single view of the chest  and PA, lateral chest and CT chest earlier today. FINDINGS: Left IJ catheter is unchanged. Small bilateral pleural effusions and subsegmental atelectasis left base appearing chain. No pneumothorax. IMPRESSION: No change in left basilar atelectasis and small bilateral pleural effusions. Electronically Signed   By: Inge Rise M.D.   On: 07/05/2015 14:37    Anti-infectives: Anti-infectives    Start     Dose/Rate Route Frequency Ordered Stop   07/05/15 1200  metroNIDAZOLE (FLAGYL) IVPB 500 mg     500 mg 100 mL/hr over 60 Minutes Intravenous Every 6 hours 07/05/15 0439     07/05/15 1200  vancomycin (VANCOCIN) 500 mg in sodium chloride irrigation 0.9 % 100  mL ENEMA     500 mg Rectal 4 times per day 07/05/15 0919     07/05/15 1000  vancomycin (VANCOCIN) 50 mg/mL oral solution 500 mg     500 mg Oral 4 times per day 07/05/15 0919     07/05/15 0600  vancomycin (VANCOCIN) 50 mg/mL oral solution 125 mg  Status:  Discontinued     125 mg Oral 4 times per day 07/05/15 0350 07/05/15 0937   07/05/15 0430  metroNIDAZOLE (FLAGYL) IVPB 500 mg  Status:  Discontinued     500 mg 100 mL/hr over 60 Minutes Intravenous Every 8 hours 07/05/15 0427 07/05/15 0439   07/04/15 2330  piperacillin-tazobactam (ZOSYN) IVPB 3.375 g     3.375 g 100 mL/hr over 30 Minutes Intravenous  Once 07/04/15 2320 07/05/15 0020   07/04/15 2330  vancomycin (VANCOCIN) 1,500 mg in sodium chloride 0.9 % 500 mL IVPB     1,500 mg 250 mL/hr over 120 Minutes Intravenous  Once 07/04/15 2320 07/05/15 0047       Assessment/Plan  POD 25, s/p ex lap with SBR -healing well and doing well C diff colitis -on vanc and flagyl.  Still with diarrhea, but fevers have resolved and he is off pressors -he may start a diet from our standpoint -nothing else surgical to add. -follow up has already been arranged for him as an outpatient. -we will sign off.  Call us as needed.  LOS: 2 days    OSBORNE,KELLY E 07/07/2015, 8:13 AM Pager: [phone]  Agree with above.  Alphonsa Overall, MD, [facility] Surgery Pager: [phone] Office phone:  [phone]

## 2015-07-07 NOTE — Evaluation (Signed)
Physical Therapy Evaluation Patient Details Name: Jeremy Jennings MRN: [id number] DOB: 1936/05/25 Today's Date: 07/07/2015   History of Present Illness  79 yo black male who has a history of stage 4 prostate cancer, HTN, DM, tachycardia, recent GI bleed.  Dx of SBO, s/p resection 06/12/15.  Marland Kitchen Acute respiratoiry failure and cardiac arrest during hospital stay. Intubated 12/13. Extubated 12/14. DC to SNF 12/22, returned 12/24 with c diff.  Clinical Impression  Patient is very motivated to progress with functional mobility. Ambulated x 20' with 2 persons today.  Pt admitted with above diagnosis. Pt currently with functional limitations due to the deficits listed below (see PT Problem List). Pt will benefit from skilled PT to increase their independence and safety with mobility to allow discharge to the venue listed below.       Follow Up Recommendations SNF;Supervision/Assistance - 24 hour    Equipment Recommendations  None recommended by PT    Recommendations for Other Services       Precautions / Restrictions Precautions Precautions: Fall Precaution Comments: abd surgery, c diff      Mobility  Bed Mobility   Bed Mobility: Sit to Supine       Sit to supine: Max assist;+2 for physical assistance;+2 for safety/equipment   General bed mobility comments: Assist for trunk and bil LEs. Increased time.patient sort of fell back onto bed. assist legs onto bed.  Transfers Overall transfer level: Needs assistance Equipment used: Rolling walker (2 wheeled) Transfers: Sit to/from Stand Sit to Stand: Mod assist;+2 physical assistance;+2 safety/equipment;From elevated surface         General transfer comment: 2 tries from recliner to stand, assist to power up from recliner.  Ambulation/Gait Ambulation/Gait assistance: Min assist;+2 safety/equipment Ambulation Distance (Feet): 20 Feet Assistive device: Rolling walker (2 wheeled) Gait Pattern/deviations: Step-through pattern      General Gait Details: slow speed,  assist with turning, cues for for safety  Stairs            Wheelchair Mobility    Modified Rankin (Stroke Patients Only)       Balance Overall balance assessment: Needs assistance Sitting-balance support: Feet supported;Bilateral upper extremity supported Sitting balance-Leahy Scale: Fair Sitting balance - Comments: difficulty leaning forward in sitting, tends  be backward,    Standing balance support: During functional activity;Bilateral upper extremity supported Standing balance-Leahy Scale: Poor                               Pertinent Vitals/Pain Pain Assessment: No/denies pain    Home Living Family/patient expects to be discharged to:: Skilled nursing facility                      Prior Function                 Hand Dominance        Extremity/Trunk Assessment   Upper Extremity Assessment: Defer to OT evaluation           Lower Extremity Assessment: Generalized weakness      Cervical / Trunk Assessment: Normal  Communication      Cognition Arousal/Alertness: Awake/alert Behavior During Therapy: WFL for tasks assessed/performed Overall Cognitive Status: Within Functional Limits for tasks assessed                      General Comments      Exercises  Assessment/Plan    PT Assessment Patient needs continued PT services  PT Diagnosis Difficulty walking;Generalized weakness   PT Problem List Decreased strength;Decreased activity tolerance;Decreased balance;Decreased mobility;Decreased knowledge of use of DME  PT Treatment Interventions DME instruction;Gait training;Functional mobility training;Therapeutic activities;Patient/family education;Balance training;Therapeutic exercise   PT Goals (Current goals can be found in the Care Plan section) Acute Rehab PT Goals Patient Stated Goal: get back to being independent PT Goal Formulation: With patient Time For Goal  Achievement: 07/21/15 Potential to Achieve Goals: Good    Frequency Min 3X/week   Barriers to discharge        Co-evaluation PT/OT/SLP Co-Evaluation/Treatment: Yes Reason for Co-Treatment: Complexity of the patient's impairments (multi-system involvement);For patient/therapist safety PT goals addressed during session: Mobility/safety with mobility         End of Session   Activity Tolerance: Patient tolerated treatment well Patient left: in bed;with call bell/phone within reach;with family/visitor present Nurse Communication: Mobility status         Time: 1415-1445 PT Time Calculation (min) (ACUTE ONLY): 30 min   Charges:   PT Evaluation $Initial PT Evaluation Tier I: 1 Procedure     PT G CodesClaretha Cooper 07/07/2015, 2:53 PM Tresa Endo PT [phone]

## 2015-07-07 NOTE — Progress Notes (Signed)
PULMONARY / CRITICAL CARE MEDICINE   Name: Jeremy Jennings MRN: [id number] DOB: 1935-12-01    ADMISSION DATE:  07/04/2015 CONSULTATION DATE:  07/05/15  REFERRING MD:  Claudine Mouton  CHIEF COMPLAINT:  fever  HISTORY OF PRESENT ILLNESS:   Jeremy Jennings is a 79 y.o. male with PMHx listed below who presents from NH with complaints of fever and diarrhea. He was recently hospitalized for SBO requiring ex-lap complicated by brief cardiac arrest as well as UTI on vanc/Zosyn. He was discharged  12/24.   MICROBIOLOGY: Urine Ctx 12/27>>> Blood Ctx x2 12/24>>> Stool C diff 12/25:  Ag & Toxin Positive  ANTIBIOTICS: Flagyl IV 12/25>>> Vancomycin PO 12/25>>> Vancomycin PR 12/25>>> Zosyn 12/24 - 12/25 Vancomycin IV 12/24 - 12/25  SIGNIFICANT EVENTS: 12/24 - Admit  LINES/TUBES: Left IJ CVL 12/25>>> Foley Catheter 12/26>>> Rectal Tube 12/26>>>  SUBJECTIVE: No acute events overnight. Patient has remained off vasopressors since PM 12/25. Denies any dyspnea or cough. No chest pain or pressure.  REVIEW OF SYSTEMS:  Denies any nausea, emesis, or abdominal pain. No fever, chills, or sweats.  VITAL SIGNS: BP 120/69 mmHg  Pulse 99  Temp(Src) 98.6 F (37 C) (Oral)  Resp 18  Wt 216 lb 0.8 oz (98 kg)  SpO2 100%  HEMODYNAMICS:       INTAKE / OUTPUT: I/O last 3 completed shifts: In: 2621.4 [I.V.:1621.4; Blood:300; Other:100; IV Piggyback:600] Out: 850 [Urine:850]  PHYSICAL EXAMINATION: General: No distress. Awake. Watching TV. Neuro:  CN 2-12 in tact. Moving all 4 extremities. Grossly nonfocal. HEENT: MMM. No oral ulcers. No scleral icterus. Cardiovascular: Anasarca. Regular rate. Unable to appreciate JVD.  Lungs:  Decreased breath sounds bilateral bases. Normal work of breathing on [place] oxygen.  Abdomen:  Soft. Protuberant. Nontender. Abdominal incision clean, dry, & intact. Integument:  Warm & dry. No rash on exposed skin.  LABS:  BMET  Recent Labs Lab 07/05/15 1600 07/06/15 0400  07/07/15 0500  NA 143 144 146*  K 4.3 4.2 3.2*  CL 115* 115* 117*  CO2 16* 15* 15*  BUN 21* 23* 27*  CREATININE 1.50* 1.53* 1.46*  GLUCOSE 148* 141* 137*    Electrolytes  Recent Labs Lab 07/05/15 0810 07/05/15 1600 07/06/15 0400 07/07/15 0500  CALCIUM 5.6* 5.8* 5.8* 5.7*  MG 1.5* 1.6* 1.6*  --   PHOS 2.1* 3.8 3.4  --     CBC  Recent Labs Lab 07/05/15 1600 07/06/15 0400 07/07/15 0500  WBC 12.1* 13.1* 15.4*  HGB 8.7* 8.2* 8.6*  HCT 26.3* 24.2* 25.3*  PLT 206 205 206    Coag's No results for input(s): APTT, INR in the last 168 hours.  Sepsis Markers  Recent Labs Lab 07/05/15 0532  07/05/15 0955 07/05/15 2200 07/06/15 0400 07/07/15 0500  LATICACIDVEN 1.30  --   --  2.1* 1.4  --   PROCALCITON  --   < > 22.98  --  28.74 14.10  < > = values in this interval not displayed.  ABG  Recent Labs Lab 07/05/15 0519  PHART 7.386  PCO2ART 25.3*  PO2ART 72.4*    Liver Enzymes  Recent Labs Lab 07/04/15 2325 07/06/15 0400 07/07/15 0500  AST 60* 60* 25  ALT 45 34 25  ALKPHOS 360* 486* 494*  BILITOT 0.8 0.4 0.6  ALBUMIN 2.0* 2.0* 1.8*    Cardiac Enzymes  Recent Labs Lab 07/05/15 0955 07/05/15 1457 07/05/15 2100  TROPONINI 0.11* 0.12* 0.16*    Glucose  Recent Labs Lab 07/06/15 1246 07/06/15 1637 07/06/15  2040 07/07/15 0010 07/07/15 0503 07/07/15 0811  GLUCAP 157* 147* 134* 142* 135* 135*    Imaging Dg Chest Port 1 View  07/07/2015  CLINICAL DATA:  79 year old male with respiratory failure. Initial encounter. Metastatic prostate cancer. EXAM: PORTABLE CHEST 1 VIEW COMPARISON:  07/05/2015 and earlier. FINDINGS: Portable AP semi upright view at at 0440 hours. Stable left IJ central line. Continued confluent platelike opacity at the left lung base. Atelectasis along the right minor fissure has decreased. Small bilateral pleural effusions were more apparent on the 07/05/2015 CT. Pulmonary vascularity is stable. Stable cardiac size and  mediastinal contours. IMPRESSION: Overall stable ventilation since 07/05/2015. Left greater than right pulmonary atelectasis and small pleural effusions. Electronically Signed   By: Genevie Ann M.D.   On: 07/07/2015 07:02   ASSESSMENT / PLAN:  PULMONARY A: Acute Hypoxic Respiratory Failure - Secondary to Atelectasis.  P: Incentive Spirometer Wean FiO2 for Sat >94% Out of Bed to Chair  CARDIOVASCULAR A:  Shock - Secondary to Sepsis. Resolved. Chronic Diastolic CHF Elevated Troponin I H/O HTN & Dyslipidemia  P:  Restart Coreg 3.125mg  bid Restart Lipitor 10mg  qhs Monitor on Telemetry Consulting Cardiology for recommendations  RENAL A:   Acute Renal Failure - Slowly Improving. Hypokalemia - Mild. Replacing IV. Hyperchloremia - Causing NAGMA. Hypocalcemia  Lactic Acidosis - Resolved.  P:   KCl 40 mEq IV today Trending UOP with Foley Monitoring renal function with daily BUN/Creatinine Awaiting Ionized Calcium result Trending electrolytes daily  GASTROINTESTINAL A:   C diff Colitis Recent SBO - S/P Ex-Lap & resection Protein Calorie Malnutrition  P:   See ID Section Has F/U With Surgery Florastor bid Start Clear Liquid Diet & advance as tolerated to Diabetic Diet  HEMATOLOGIC A:   Anemia - No signs of active blood loss. Likely due to chronic disease. S/P 2u PRBC after admit. Leukocytosis - Likely secondary to Sepsis.   P:  Trending Hgb & Leukocyte count daily with CBC Heparin [place] q8hr SCDs  INFECTIOUS A:   Septic Shock - Secondary to Severe C diff colitis. C diff Colitis   P:   Trending Leukocytosis Switching IV Flagyl to PO Flagyl q8hr Day #3/14 D/C Vancomycin Enema Vancomycin PO Day #3/14 Monitor for fever Procalcitonin trending downward  ENDOCRINE A:   DM2 - Good BG control.  P:   Changing Accu-Checks qAC & HS Low dose SSI  NEUROLOGIC A:   Acute Encephalopathy - Secondary to Toxic Metabolic. Resolved.  P:   Monitor in SDU RASS goal:  0 Avoid sedating medications  FAMILY  - Updates: Daughter [name] updated by phone today by Dr. Ashok Cordia.  DISCUSSION: 79 year old male recently d/c to SNF s/p hospitalization for SBO requiring expl lap. Re-admitted w/ septic shock/MODS on 12/24 d/t Cdiff. Switching antibiotics over to oral regimen. Patient has been off vasopressors since 12/25 PM. Restarting some of his home medications. Starting diet and advancing as tolerated. Consulting PT/OT/Case Management for rehab needs. Transfer to Stepdown with [facility] to assume care & PCCM will sign off on 12/28.  Sonia Baller Ashok Cordia, M.D. [facility] Pulmonary & Critical Care Pager:  [phone] After 3pm or if no response, call [phone] 07/07/2015, 8:48 AM

## 2015-07-07 NOTE — Evaluation (Signed)
Occupational Therapy Evaluation Patient Details Name: ZADE NORDHOFF MRN: [id number] DOB: 1936/06/18 Today's Date: 07/07/2015    History of Present Illness 79 yo black male who has a history of stage 4 prostate cancer, HTN, DM, tachycardia, recent GI bleed.  Dx of SBO, s/p resection 06/12/15.  Marland Kitchen Acute respiratoiry failure and cardiac arrest during hospital stay. Intubated 12/13. Extubated 12/14. DC to SNF 12/22, returned 12/24 with c diff.,   Clinical Impression   Pt was readmitted from SNF for fever/diarrhea.  He will benefit from continued OT in both acute and SNF setting to increase safety and independence with adls.  Pt currently needs +2 mod A for sit to stand for transfers and LB adls and +2 max A for bed mobility. Goals in acute are for min to mod A for ADLs/toilet transfers    Follow Up Recommendations  SNF    Equipment Recommendations  3 in 1 bedside comode    Recommendations for Other Services       Precautions / Restrictions Precautions Precautions: Fall Precaution Comments: abd surgery, c diff Restrictions Weight Bearing Restrictions: No      Mobility Bed Mobility   Bed Mobility: Sit to Supine       Sit to supine: Max assist;+2 for physical assistance;+2 for safety/equipment   General bed mobility comments: Assist for trunk and bil LEs. Increased time.patient sort of fell back onto bed. assist legs onto bed.  Transfers Overall transfer level: Needs assistance Equipment used: Rolling walker (2 wheeled) Transfers: Sit to/from Stand Sit to Stand: Mod assist;+2 physical assistance;+2 safety/equipment;From elevated surface         General transfer comment: 2 tries from recliner to stand, assist to power up from recliner.    Balance Overall balance assessment: Needs assistance Sitting-balance support: Feet supported;Bilateral upper extremity supported Sitting balance-Leahy Scale: Fair Sitting balance - Comments: difficulty leaning forward in sitting,  tends  be backward,    Standing balance support: During functional activity;Bilateral upper extremity supported Standing balance-Leahy Scale: Poor                              ADL       Grooming: Set up;Sitting   Upper Body Bathing: Set up;Sitting   Lower Body Bathing: Moderate assistance;+2 for physical assistance;Sit to/from stand   Upper Body Dressing : Minimal assistance;Sitting (lines)   Lower Body Dressing: Maximal assistance;+2 for physical assistance;Sit to/from stand   Toilet Transfer: Moderate assistance;+2 for physical assistance;Ambulation (chair to bed; mod +2 to stand; min +2 for safety ambulating)             General ADL Comments: pt with rectal tube and catheter; did not bend much.  Had recent exploratory lap sx     Vision     Perception     Praxis      Pertinent Vitals/Pain Pain Assessment: No/denies pain     Hand Dominance     Extremity/Trunk Assessment Upper Extremity Assessment Upper Extremity Assessment: Overall WFL for tasks assessed      Cervical / Trunk Assessment Cervical / Trunk Assessment: Normal   Communication Communication Communication: No difficulties   Cognition Arousal/Alertness: Awake/alert Behavior During Therapy: WFL for tasks assessed/performed Overall Cognitive Status: Within Functional Limits for tasks assessed                     General Comments       Exercises  Shoulder Instructions      Home Living Family/patient expects to be discharged to:: Skilled nursing facility                                 Additional Comments: was at snf for one day prior to readmission      Prior Functioning/Environment Level of Independence: Needs assistance        Comments: needed assistance for adls/mobility after sx    OT Diagnosis: Generalized weakness   OT Problem List: Decreased strength;Decreased activity tolerance;Decreased knowledge of use of DME or AE;Pain   OT  Treatment/Interventions: Self-care/ADL training;DME and/or AE instruction;Patient/family education;Balance training;Energy conservation    OT Goals(Current goals can be found in the care plan section) Acute Rehab OT Goals Patient Stated Goal: get back to being independent OT Goal Formulation: With patient Time For Goal Achievement: 07/21/15 Potential to Achieve Goals: Good ADL Goals Pt Will Perform Grooming: with min guard assist;standing Pt Will Perform Lower Body Bathing: with min assist;with adaptive equipment;sit to/from stand Pt Will Perform Lower Body Dressing: with mod assist;with adaptive equipment;sit to/from stand Pt Will Transfer to Toilet: with min assist;ambulating;bedside commode Pt Will Perform Toileting - Clothing Manipulation and hygiene: with min assist;sit to/from stand  OT Frequency: Min 2X/week   Barriers to D/C:            Co-evaluation PT/OT/SLP Co-Evaluation/Treatment: Yes Reason for Co-Treatment: Complexity of the patient's impairments (multi-system involvement) PT goals addressed during session: Mobility/safety with mobility OT goals addressed during session: ADL's and self-care      End of Session    Activity Tolerance: Patient tolerated treatment well;No increased pain Patient left: in bed;with call bell/phone within reach;with family/visitor present   Time: [id number] OT Time Calculation (min): 18 min Charges:  OT General Charges $OT Visit: 1 Procedure OT Evaluation $Initial OT Evaluation Tier I: 1 Procedure G-Codes:    Willet Schleifer 2015-07-23, 3:14 PM  Lesle Chris, OTR/L [phone] 23-Jul-2015

## 2015-07-07 NOTE — Consult Note (Signed)
CARDIOLOGY CONSULT NOTE   Patient ID: Jeremy Jennings MRN: [id number], DOB/AGE: August 10, 1935   Admit date: 07/04/2015 Date of Consult: 07/07/2015 Reason for Consult: Elevated Troponin   Primary Physician: [name], MD Primary Cardiologist: Dr. Mare Ferrari  HPI: Jeremy Jennings is a 79 y.o. male with PMH of HTN, HLD, Type 2 DM, and Stage 4 Prostate Cancer recently admitted from 06/11/2015 to 07/03/2015 for a small bowel obstruction. This was complicated by post-op ileus, fever, PNA, enterococcal UTI, tachycardia, and PEA arrest on 06/23/2015. Cardiology was consulted during that time for tachycardia and later on for acute on chronic diastolic CHF.  After being discharged on 07/03/2015, he was doing well at his rehabilitation facility, ambulatory without assistive devices and talking coherently. The following morning, on 07/04/2015, he was noted to be lethargic and complained of new-onset abdominal pain. Upon arrival to the ED, he was tachycardia into the 120's and febrile to 105 F. He had several watery stools while in the ED and his C-diff panel was positive for the antigen and toxin. Lactic Acid found to be elevated to 2.1. With this, Code Sepsis was initiated and he has been started on Vancomycin and Zosyn. He was also initially placed on pressors for BP support but this was successfully weaned on 07/05/2015.  His BP has been 110/54 - 155/87 in the past 24 hours. He is being restarted on Coreg 3.125mg  BID (was on 12.5mg  BID PTA) and Lipitor 10mg  daily starting today. HR has been in the low-100's to 110's.   His cyclic troponin values have been 0.11, 0.12, and 0.16. He denies any recent chest pain, palpitations, dyspnea on exertion or anginal equivalents. Reports significant improvement in his symptoms since admission.   Problem List Past Medical History  Diagnosis Date  . Essential hypertension   . Type 2 diabetes mellitus ([place])   . Hyperlipidemia   . Prostate cancer ([place])   .  History of GI bleed   . Tachycardia   . PAC (premature atrial contraction)     Frequent    Past Surgical History  Procedure Laterality Date  . Tonsillectomy    . Hemorroidectomy  1969  . Esophagogastroduodenoscopy N/A 05/28/2015    Procedure: ESOPHAGOGASTRODUODENOSCOPY (EGD);  Surgeon: [name], MD;  Location: [name];  Service: Endoscopy;  Laterality: N/A;  . Inguinal hernia repair Bilateral AFE 16-17  . Hernia repair  1981    [facility]  . Laparotomy N/A 06/12/2015    Procedure: EXPLORATORY LAPAROTOMY;  Surgeon: [name], MD;  Location: [facility];  Service: General;  Laterality: N/A;  . Bowel resection N/A 06/12/2015    Procedure: SMALL BOWEL RESECTION;  Surgeon: [name], MD;  Location: [facility];  Service: General;  Laterality: N/A;     Allergies No Known Allergies    Inpatient Medications . antiseptic oral rinse  7 mL Mouth Rinse q12n4p  . atorvastatin  10 mg Oral q1800  . carvedilol  3.125 mg Oral BID WC  . chlorhexidine  15 mL Mouth Rinse BID  . heparin subcutaneous  5,000 Units Subcutaneous 3 times per Jeremy  . insulin aspart  0-5 Units Subcutaneous QHS  . insulin aspart  0-9 Units Subcutaneous TID WC  . metroNIDAZOLE  500 mg Oral 3 times per Jeremy  . potassium chloride  10 mEq Intravenous Q1 Hr x 4  . saccharomyces boulardii  250 mg Oral BID  . tamsulosin  0.4 mg Oral Daily  . vancomycin  500 mg Oral 4 times per Jeremy  Family History Family History  Problem Relation Age of Onset  . Cancer Neg Hx   . Kidney failure Brother   . Kidney failure Sister      Social History Social History   Social History  . Marital Status: Widowed    Spouse Name: N/A  . Number of Children: N/A  . Years of Education: N/A   Occupational History  . Not on file.   Social History Main Topics  . Smoking status: Former Smoker -- 0.25 packs/Jeremy for 20 years    Types: Cigarettes    Quit date: 07/12/1979  . Smokeless tobacco: Never Used  . Alcohol Use: No  .  Drug Use: No  . Sexual Activity: Not Currently   Other Topics Concern  . Not on file   Social History Narrative     Review of Systems General:  No chills, fever, night sweats or weight changes.  Cardiovascular:  No chest pain, dyspnea on exertion, edema, orthopnea, palpitations, paroxysmal nocturnal dyspnea. Dermatological: No rash, lesions/masses Respiratory: No cough, dyspnea Urologic: No hematuria, dysuria Abdominal:   No nausea, vomiting, bright red blood per rectum, melena, or hematemesis. Positive for diarrhea and abdominal pain. Neurologic:  No visual changes, wkns, changes in mental status. All other systems reviewed and are otherwise negative except as noted above.  Physical Exam  Blood pressure 143/74, pulse 99, temperature 98.6 F (37 C), temperature source Oral, resp. rate 24, weight 216 lb 0.8 oz (98 kg), SpO2 100 %.  General: Pleasant, NAD Psych: Normal affect. Neuro: Alert and oriented X 3. Moves all extremities spontaneously. HEENT: Normal  Neck: Supple without bruits or JVD. Lungs:  Resp regular and unlabored, CTA without wheezing or rales. Heart: Tachycardiac, regular rhythm, no s3, s4, or murmurs. Abdomen: Soft, non-tender, non-distended, BS + x 4.  Extremities: No clubbing, cyanosis or edema. DP/PT/Radials 2+ and equal bilaterally.  Labs   Recent Labs  07/05/15 0955 07/05/15 1457 07/05/15 2100  TROPONINI 0.11* 0.12* 0.16*   Lab Results  Component Value Date   WBC 15.4* 07/07/2015   HGB 8.6* 07/07/2015   HCT 25.3* 07/07/2015   MCV 85.8 07/07/2015   PLT 206 07/07/2015    Recent Labs Lab 07/07/15 0500  NA 146*  K 3.2*  CL 117*  CO2 15*  BUN 27*  CREATININE 1.46*  CALCIUM 5.7*  PROT 5.6*  BILITOT 0.6  ALKPHOS 494*  ALT 25  AST 25  GLUCOSE 137*   Lab Results  Component Value Date   CHOL 167 12/10/2014   HDL 37.50* 12/10/2014   LDLCALC 108* 12/10/2014   TRIG 161* 06/29/2015   Radiology/Studies Dg Chest 2 View: 07/05/2015   CLINICAL DATA:  Fever, nausea and diarrhea today. History of diabetes, hypertension, tachycardia. EXAM: CHEST  2 VIEW COMPARISON:  CT chest June 05, 2015 at 0118 hours FINDINGS: Cardiomediastinal silhouette is normal for this low inspiratory portable examination with crowded vascular markings. Nipple shadows in lung bases. Small pleural effusions better seen on today's CT chest. Bibasilar atelectasis. No pneumothorax. Diffusely sclerotic axial skeleton compatible with patient's history of metastatic prostate cancer. IMPRESSION: Small pleural effusions and bibasilar atelectasis better seen on today's dedicated CT chest. Electronically Signed   By: Elon Alas M.D.   On: 07/05/2015 02:08   Ct Chest W Contrast: 07/05/2015  CLINICAL DATA:  Acute onset of generalized abdominal pain. Fever and diarrhea. Initial encounter. EXAM: CT CHEST, ABDOMEN, AND PELVIS WITH CONTRAST TECHNIQUE: Multidetector CT imaging of the chest, abdomen and pelvis was performed  following the standard protocol during bolus administration of intravenous contrast. CONTRAST:  111mL OMNIPAQUE IOHEXOL 300 MG/ML  SOLN COMPARISON:  CTA of the chest and CT of the abdomen and pelvis performed 06/19/2015 FINDINGS: CT CHEST Trace bilateral pleural effusions are noted, with mild bibasilar atelectasis. No pneumothorax is seen. No significant focal airspace consolidation is appreciated. No masses are identified. Diffuse coronary artery calcifications are seen. The mediastinum is otherwise unremarkable. No mediastinal lymphadenopathy is appreciated. The great vessels are grossly unremarkable in appearance. 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 No pericardial effusion is identified. The thyroid gland is unremarkable in appearance. No axillary lymphadenopathy is appreciated. Diffuse sclerotic lesions are noted throughout the visualized osseous structures, reflecting known metastatic prostate cancer. CT  ABDOMEN AND PELVIS The liver and spleen are unremarkable in appearance. The gallbladder is within normal limits. The pancreas and adrenal glands are unremarkable. Scattered bilateral renal cysts are seen, measuring up to 3.4 cm in size. There is no evidence of hydronephrosis. No renal or ureteral stones are seen. Mild nonspecific perinephric stranding is noted bilaterally. Contrast progresses into the renal calyces only on repeat delayed images at 11 minutes, concerning for renal insufficiency. No free fluid is identified. The small bowel is unremarkable in appearance. A bowel suture line at the mid abdomen is unremarkable. The stomach is within normal limits. No acute vascular abnormalities are seen. Minimal calcification is noted along the abdominal aorta and its branches. The appendix is normal in caliber, without evidence of appendicitis. The colon is unremarkable in appearance, aside from mild wall thickening along the rectum, raising question for mild proctitis. Mild nonspecific presacral stranding is noted. The bladder is decompressed and not well assessed. The prostate remains normal in size. No inguinal lymphadenopathy is seen. Diffuse sclerotic lesions are noted throughout the visualized osseous structures, reflecting known metastatic prostate cancer. IMPRESSION: 1. Trace bilateral pleural effusions, with mild bibasilar atelectasis. No definite evidence of pneumonia. 2. Apparent mild wall thickening along the rectum raises question for mild proctitis. Mild nonspecific presacral stranding noted. 3. Delayed excretion of contrast from the kidneys, concerning for renal insufficiency. 4. Diffuse coronary artery calcifications seen. 5. 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 6. Scattered bilateral renal cysts seen. 7. Diffuse sclerotic lesions throughout the visualized osseous structures, reflecting known metastatic prostate cancer. Electronically Signed   By: Garald Balding  M.D.   On: 07/05/2015 01:49   Dg Chest Port 1 View: 07/07/2015  CLINICAL DATA:  79 year old male with respiratory failure. Initial encounter. Metastatic prostate cancer. EXAM: PORTABLE CHEST 1 VIEW COMPARISON:  07/05/2015 and earlier. FINDINGS: Portable AP semi upright view at at 0440 hours. Stable left IJ central line. Continued confluent platelike opacity at the left lung base. Atelectasis along the right minor fissure has decreased. Small bilateral pleural effusions were more apparent on the 07/05/2015 CT. Pulmonary vascularity is stable. Stable cardiac size and mediastinal contours. IMPRESSION: Overall stable ventilation since 07/05/2015. Left greater than right pulmonary atelectasis and small pleural effusions. Electronically Signed   By: Genevie Ann M.D.   On: 07/07/2015 07:02    ECG: Sinus tachycardia, rate in 110's.  ECHOCARDIOGRAM: 06/29/2015 Study Conclusions - Left ventricle: Hyperdynamic LV function but less cavity obliteration than previous and no LVOT gradient The cavity size was normal. Wall thickness was increased in a pattern of moderate LVH. Systolic function was vigorous. The estimated ejection fraction was in the range of 65% to 70%. Wall motion was normal;  there were no regional wall motion abnormalities. Doppler parameters are consistent with elevated ventricular end-diastolic filling pressure. - Left atrium: The atrium was mildly dilated. - Atrial septum: No defect or patent foramen ovale was identified.  ASSESSMENT AND PLAN  1. Elevated Troponin - cyclic troponin values have been 0.11, 0.12, and 0.16 in the setting of septic shock secondary to C.diff colitis. - likely to represent demand ischemia in the setting of his acute illness. - denies any anginal symptoms. EKG shows no acute ischemic changes. - would not plan for any invasive workup at this time.  2. Chronic Diastolic CHF - does not appear volume overloaded on exam. - will restart PTA Lasix 20mg   daily prior to discharge.   3. HTN - BP has been 110/54 - 155/87 in the past 24 hours - will restart home medications as BP allows.   4. Sinus tachycardia with Frequent PAC's - His Diltiazem 360mg  daily was stopped during his last admission and replaced with Carvedilol 12.5mg  BID. However, both were listed on his discharge summary as "TO TAKE". Would continue with Carvedilol for now and increase as BP allows. Appears his HR was better controlled on Carvedilol than Cardizem, so could hopefully only do Carvedilol for now.  5. Septic Shock secondary to Severe C.diff Colitis - currently off pressors - on Abx treatment - per Critical Care   Signed, Jeremy Heritage, PA-C 07/07/2015, 11:54 AM Pager: [phone] Agree with assessment and plan as noted above.  Today he is sitting comfortably in the chair.  He denies any chest discomfort or shortness of breath.  His diarrhea is improving.  His blood pressure has been stable off of pressors and he is starting back on low dose carvedilol which can be gradually increased over the next few days depending on blood pressure and heart rate response.  Telemetry shows sinus tachycardia at 106 bpm with occasional PACs.  His slight elevation of troponins and a plateau pattern is consistent with demand ischemia.  Agree no further ischemic workup indicated at this point.

## 2015-07-07 NOTE — Care Management Note (Signed)
Case Management Note  Patient Details  Name: Jeremy Jennings MRN: [id number] Date of Birth: 1936/03/27  Subjective/Objective:           sepsis         Action/Plan:Date: July 07, 2015 Chart reviewed for concurrent status and case management needs. Will continue to follow patient for changes and needs: Velva Harman, RN, BSN, [place]   [phone]   Expected Discharge Date:   (UNKNOWN)               Expected Discharge Plan:  Home/Self Care  In-House Referral:  Clinical Social Work  Discharge planning Services  CM Consult  Post Acute Care Choice:  NA Choice offered to:  NA  DME Arranged:    DME Agency:     HH Arranged:    [place] Agency:     Status of Service:  In process, will continue to follow  Medicare Important Message Given:    Date Medicare IM Given:    Medicare IM give by:    Date Additional Medicare IM Given:    Additional Medicare Important Message give by:     If discussed at [place] of Stay Meetings, dates discussed:    Additional Comments:  Jeremy Cha, RN 07/07/2015, 10:07 AM

## 2015-07-08 DIAGNOSIS — J9600 Acute respiratory failure, unspecified whether with hypoxia or hypercapnia: Secondary | ICD-10-CM

## 2015-07-08 DIAGNOSIS — N178 Other acute kidney failure: Secondary | ICD-10-CM

## 2015-07-08 LAB — GLUCOSE, CAPILLARY
GLUCOSE-CAPILLARY: 151 mg/dL — AB (ref 65–99)
GLUCOSE-CAPILLARY: 161 mg/dL — AB (ref 65–99)
Glucose-Capillary: 167 mg/dL — ABNORMAL HIGH (ref 65–99)

## 2015-07-08 LAB — RENAL FUNCTION PANEL
ANION GAP: 12 (ref 5–15)
Albumin: 1.8 g/dL — ABNORMAL LOW (ref 3.5–5.0)
BUN: 26 mg/dL — ABNORMAL HIGH (ref 6–20)
CALCIUM: 5.9 mg/dL — AB (ref 8.9–10.3)
CO2: 15 mmol/L — AB (ref 22–32)
CREATININE: 1.09 mg/dL (ref 0.61–1.24)
Chloride: 120 mmol/L — ABNORMAL HIGH (ref 101–111)
GFR calc Af Amer: >60 mL/min (ref >60)
GFR calc non Af Amer: >60 mL/min (ref >60)
Glucose, Bld: 168 mg/dL — ABNORMAL HIGH (ref 65–99)
PHOSPHORUS: 1.7 mg/dL — AB (ref 2.5–4.6)
Potassium: 3 mmol/L — ABNORMAL LOW (ref 3.5–5.1)
SODIUM: 147 mmol/L — AB (ref 135–145)

## 2015-07-08 LAB — CBC WITH DIFFERENTIAL/PLATELET
BASOS ABS: 0 10*3/uL (ref 0.0–0.1)
Basophils Relative: 0 %
EOS ABS: 0.1 10*3/uL (ref 0.0–0.7)
Eosinophils Relative: 1 %
HCT: 26.7 % — ABNORMAL LOW (ref 39.0–52.0)
Hemoglobin: 8.9 g/dL — ABNORMAL LOW (ref 13.0–17.0)
LYMPHS ABS: 0.9 10*3/uL (ref 0.7–4.0)
Lymphocytes Relative: 7 %
MCH: 28.8 pg (ref 26.0–34.0)
MCHC: 33.3 g/dL (ref 30.0–36.0)
MCV: 86.4 fL (ref 78.0–100.0)
MONO ABS: 1.1 10*3/uL — AB (ref 0.1–1.0)
Monocytes Relative: 8 %
Neutro Abs: 11.1 10*3/uL — ABNORMAL HIGH (ref 1.7–7.7)
Neutrophils Relative %: 84 %
PLATELETS: 193 10*3/uL (ref 150–400)
RBC: 3.09 MIL/uL — AB (ref 4.22–5.81)
RDW: 17.8 % — AB (ref 11.5–15.5)
WBC: 13.2 10*3/uL — AB (ref 4.0–10.5)

## 2015-07-08 LAB — URINE CULTURE: CULTURE: NO GROWTH

## 2015-07-08 LAB — MAGNESIUM: MAGNESIUM: 2.4 mg/dL (ref 1.7–2.4)

## 2015-07-08 MED ORDER — BOOST PLUS PO LIQD
237.0000 mL | Freq: Two times a day (BID) | ORAL | Status: DC
  Administered 2015-07-10 – 2015-07-14 (×5): 237 mL via ORAL
  Filled 2015-07-08 (×16): qty 237

## 2015-07-08 MED ORDER — CARVEDILOL 6.25 MG PO TABS
6.2500 mg | ORAL_TABLET | Freq: Two times a day (BID) | ORAL | Status: DC
Start: 2015-07-08 — End: 2015-07-09
  Administered 2015-07-08: 6.25 mg via ORAL
  Filled 2015-07-08: qty 1

## 2015-07-08 MED ORDER — POTASSIUM CHLORIDE CRYS ER 20 MEQ PO TBCR
40.0000 meq | EXTENDED_RELEASE_TABLET | Freq: Once | ORAL | Status: AC
  Administered 2015-07-08: 40 meq via ORAL
  Filled 2015-07-08: qty 2

## 2015-07-08 MED ORDER — FUROSEMIDE 10 MG/ML IJ SOLN
40.0000 mg | Freq: Two times a day (BID) | INTRAMUSCULAR | Status: AC
  Administered 2015-07-08 – 2015-07-09 (×3): 40 mg via INTRAVENOUS
  Filled 2015-07-08 (×3): qty 4

## 2015-07-08 MED ORDER — FUROSEMIDE 20 MG PO TABS: 20.0000 mg | ORAL_TABLET | Freq: Every day | ORAL | Status: DC

## 2015-07-08 MED ORDER — POTASSIUM CHLORIDE CRYS ER 20 MEQ PO TBCR
40.0000 meq | EXTENDED_RELEASE_TABLET | Freq: Once | ORAL | Status: AC
  Administered 2015-07-09: 40 meq via ORAL
  Filled 2015-07-08: qty 2

## 2015-07-08 NOTE — Progress Notes (Addendum)
Patient Name: Jeremy Jennings Date of Encounter: 07/08/2015  Active Problems:   Sepsis ([place])   Septic shock ([place])   C. difficile colitis   Acute renal failure ([place])   Elevated troponin I level   Primary Cardiologist: Dr. Mare Ferrari Patient Profile: 79 y.o. male with PMH of HTN, HLD, Type 2 DM, and Stage 4 Prostate Cancer recently admitted from 06/11/2015 to 07/03/2015 for a small bowel obstruction. Represented on 07/04/2015 with Septic Shock secondary to severe C.diff Colitis. Cardiology consulted for elevated troponin values.  SUBJECTIVE: Reports his breathing is at baseline. Denies any chest pain or palpitations. Consuming regular food now.  OBJECTIVE Filed Vitals:   07/08/15 0500 07/08/15 0600 07/08/15 0800 07/08/15 1200  BP:   138/75 127/79  Pulse: 67 103 101 99  Temp:   97.8 F (36.6 C) 97.9 F (36.6 C)  TempSrc:    Oral  Resp: 20 23 20 21   Height:   6' (1.829 m)   Weight: 219 lb 9.3 oz (99.6 kg)     SpO2: 100% 98% 100% 100%    Intake/Output Summary (Last 24 hours) at 07/08/15 1421 Last data filed at 07/08/15 0800  Gross per 24 hour  Intake    490 ml  Output   1275 ml  Net   -785 ml   Filed Weights   07/06/15 0417 07/07/15 0600 07/08/15 0500  Weight: 214 lb 4.6 oz (97.2 kg) 216 lb 0.8 oz (98 kg) 219 lb 9.3 oz (99.6 kg)    PHYSICAL EXAM General: Well developed, well nourished, male in no acute distress. Head: Normocephalic, atraumatic.  Neck: Supple without bruits, JVD not elevated. Lungs:  Resp regular and unlabored, CTA without wheezing or rales. Heart: RRR, S1, S2, no S3, S4, or murmur; no rub. Abdomen: Soft, non-tender, non-distended with normoactive bowel sounds. No hepatomegaly. No rebound/guarding. No obvious abdominal masses. Extremities: No clubbing or cyanosis, 1+ edema bilaterally. Distal pedal pulses are 2+ bilaterally. Neuro: Alert and oriented X 3. Moves all extremities spontaneously. Psych: Normal affect.   LABS: CBC:  Recent Labs  07/06/15 0400 07/07/15 0500 07/07/15 1258 07/08/15 0515  WBC 13.1* 15.4*  --  13.2*  NEUTROABS 11.5*  --   --  11.1*  HGB 8.2* 8.6* 9.5* 8.9*  HCT 24.2* 25.3* 28.0* 26.7*  MCV 86.7 85.8  --  86.4  PLT 205 206  --  0000000   Basic Metabolic Panel:  Recent Labs  07/06/15 0400 07/07/15 0500 07/07/15 1258 07/08/15 0515  NA 144 146* 147* 147*  K 4.2 3.2* 2.9* 3.0*  CL 115* 117* 115* 120*  CO2 15* 15*  --  15*  GLUCOSE 141* 137* 139* 168*  BUN 23* 27* 23* 26*  CREATININE 1.53* 1.46* 1.20 1.09  CALCIUM 5.8* 5.7*  --  5.9*  MG 1.6*  --   --  2.4  PHOS 3.4  --   --  1.7*   Liver Function Tests:  Recent Labs  07/06/15 0400 07/07/15 0500 07/08/15 0515  AST 60* 25  --   ALT 34 25  --   ALKPHOS 486* 494*  --   BILITOT 0.4 0.6  --   PROT 5.6* 5.6*  --   ALBUMIN 2.0* 1.8* 1.8*   Cardiac Enzymes:  Recent Labs  07/05/15 1457 07/05/15 2100  TROPONINI 0.12* 0.16*   BNP:  B NATRIURETIC PEPTIDE  Date/Time Value Ref Range Status  07/05/2015 09:55 AM 348.0* 0.0 - 100.0 pg/mL Final  06/23/2015 05:30 AM 147.8*  0.0 - 100.0 pg/mL Final    TELE: NSR with rate in 90's - low-100's. Frequent PVC's.       ECHOCARDIOGRAM: 06/29/2015 Study Conclusions - Left ventricle: Hyperdynamic LV function but less cavity obliteration than previous and no LVOT gradient The cavity size was normal. Wall thickness was increased in a pattern of moderate LVH. Systolic function was vigorous. The estimated ejection fraction was in the range of 65% to 70%. Wall motion was normal; there were no regional wall motion abnormalities. Doppler parameters are consistent with elevated ventricular end-diastolic filling pressure. - Left atrium: The atrium was mildly dilated. - Atrial septum: No defect or patent foramen ovale was identified.  Radiology/Studies: Dg Chest Port 1 View: 07/07/2015  CLINICAL DATA:  79 year old male with respiratory failure. Initial encounter. Metastatic prostate  cancer. EXAM: PORTABLE CHEST 1 VIEW COMPARISON:  07/05/2015 and earlier. FINDINGS: Portable AP semi upright view at at 0440 hours. Stable left IJ central line. Continued confluent platelike opacity at the left lung base. Atelectasis along the right minor fissure has decreased. Small bilateral pleural effusions were more apparent on the 07/05/2015 CT. Pulmonary vascularity is stable. Stable cardiac size and mediastinal contours. IMPRESSION: Overall stable ventilation since 07/05/2015. Left greater than right pulmonary atelectasis and small pleural effusions. Electronically Signed   By: Genevie Ann M.D.   On: 07/07/2015 07:02     Current Medications:  . antiseptic oral rinse  7 mL Mouth Rinse q12n4p  . atorvastatin  10 mg Oral q1800  . brimonidine  1 drop Right Eye Daily   And  . timolol  1 drop Right Eye Daily  . carvedilol  6.25 mg Oral BID WC  . chlorhexidine  15 mL Mouth Rinse BID  . heparin subcutaneous  5,000 Units Subcutaneous 3 times per day  . insulin aspart  0-5 Units Subcutaneous QHS  . insulin aspart  0-9 Units Subcutaneous TID WC  . lactose free nutrition  237 mL Oral BID BM  . metroNIDAZOLE  500 mg Oral 3 times per day  . prednisoLONE acetate  1 drop Both Eyes BID  . saccharomyces boulardii  250 mg Oral BID  . tamsulosin  0.4 mg Oral Daily  . vancomycin  500 mg Oral 4 times per day   . lactated ringers Stopped (07/07/15 2300)    ASSESSMENT AND PLAN:  1. Elevated Troponin - cyclic troponin values have been 0.11, 0.12, and 0.16 in the setting of septic shock secondary to C.diff colitis. - likely to represent demand ischemia in the setting of his acute illness. - denies any anginal symptoms. EKG shows no acute ischemic changes. - would not plan for any invasive workup at this time.  2. Chronic Diastolic CHF - EF [id number] in recent echo with no wall motion abnormalities. - has lower extremity edema on examination. Is +5.6L this admission due to need for hydration in setting of  septic shock. - Dose IV lasix today and tomorrow - BNP elevated around 350. He is +5L positive. Creatinine is normal. Monitor potassium and replete.  3. HTN - BP has been 123/62 - 138/85 in the past 24 hours - will restart home medications as BP allows.   4. Sinus tachycardia with Frequent PAC's - His Diltiazem 360mg  daily was stopped during his last admission and replaced with Carvedilol 12.5mg  BID. However, both were listed on his discharge summary as "TO TAKE". Would continue with Carvedilol for now and increase as BP allows. Appears his HR was better controlled on Carvedilol than Cardizem,  so could hopefully only do Carvedilol for now. Will increase Carvedilol from 3.125mg  BID to 6.25mg  BID today.  5. Septic Shock secondary to Severe C.diff Colitis - currently off pressors - on Abx treatment - per Critical Care  Pixie Casino, MD, [facility] Attending Cardiologist [facility]  Nadean Corwin Hilty 2:21 PM 07/08/2015

## 2015-07-08 NOTE — Progress Notes (Deleted)
Patient Name: Jeremy Jennings Date of Encounter: 07/08/2015  Active Problems:   Sepsis ([place])   Septic shock ([place])   C. difficile colitis   Acute renal failure ([place])   Elevated troponin I level   Primary Cardiologist: Dr. Mare Ferrari Patient Profile: 79 y.o. male with PMH of HTN, HLD, Type 2 DM, and Stage 4 Prostate Cancer recently admitted from 06/11/2015 to 07/03/2015 for a small bowel obstruction. Represented on 07/04/2015 with Septic Shock secondary to severe C.diff Colitis. Cardiology consulted for elevated troponin values.  SUBJECTIVE: Reports his breathing is at baseline. Denies any chest pain or palpitations. Consuming regular food now.  OBJECTIVE Filed Vitals:   07/08/15 0403 07/08/15 0500 07/08/15 0600 07/08/15 0800  BP:    138/75  Pulse:  67 103 101  Temp: 97.6 F (36.4 C)   97.8 F (36.6 C)  TempSrc: Oral     Resp:  20 23 20   Weight:  219 lb 9.3 oz (99.6 kg)    SpO2:  100% 98% 100%    Intake/Output Summary (Last 24 hours) at 07/08/15 [id number] Last data filed at 07/08/15 0800  Gross per 24 hour  Intake 1333.75 ml  Output   1275 ml  Net  58.75 ml   Filed Weights   07/06/15 0417 07/07/15 0600 07/08/15 0500  Weight: 214 lb 4.6 oz (97.2 kg) 216 lb 0.8 oz (98 kg) 219 lb 9.3 oz (99.6 kg)    PHYSICAL EXAM General: Well developed, well nourished, male in no acute distress. Head: Normocephalic, atraumatic.  Neck: Supple without bruits, JVD not elevated. Lungs:  Resp regular and unlabored, CTA without wheezing or rales. Heart: RRR, S1, S2, no S3, S4, or murmur; no rub. Abdomen: Soft, non-tender, non-distended with normoactive bowel sounds. No hepatomegaly. No rebound/guarding. No obvious abdominal masses. Extremities: No clubbing or cyanosis, 1+ edema bilaterally. Distal pedal pulses are 2+ bilaterally. Neuro: Alert and oriented X 3. Moves all extremities spontaneously. Psych: Normal affect.   LABS: CBC: Recent Labs  07/06/15 0400 07/07/15 0500  07/07/15 1258 07/08/15 0515  WBC 13.1* 15.4*  --  13.2*  NEUTROABS 11.5*  --   --  11.1*  HGB 8.2* 8.6* 9.5* 8.9*  HCT 24.2* 25.3* 28.0* 26.7*  MCV 86.7 85.8  --  86.4  PLT 205 206  --  0000000   Basic Metabolic Panel: Recent Labs  07/06/15 0400 07/07/15 0500 07/07/15 1258 07/08/15 0515  NA 144 146* 147* 147*  K 4.2 3.2* 2.9* 3.0*  CL 115* 117* 115* 120*  CO2 15* 15*  --  15*  GLUCOSE 141* 137* 139* 168*  BUN 23* 27* 23* 26*  CREATININE 1.53* 1.46* 1.20 1.09  CALCIUM 5.8* 5.7*  --  5.9*  MG 1.6*  --   --  2.4  PHOS 3.4  --   --  1.7*   Liver Function Tests: Recent Labs  07/06/15 0400 07/07/15 0500 07/08/15 0515  AST 60* 25  --   ALT 34 25  --   ALKPHOS 486* 494*  --   BILITOT 0.4 0.6  --   PROT 5.6* 5.6*  --   ALBUMIN 2.0* 1.8* 1.8*   Cardiac Enzymes: Recent Labs  07/05/15 0955 07/05/15 1457 07/05/15 2100  TROPONINI 0.11* 0.12* 0.16*   BNP:  B NATRIURETIC PEPTIDE  Date/Time Value Ref Range Status  07/05/2015 09:55 AM 348.0* 0.0 - 100.0 pg/mL Final  06/23/2015 05:30 AM 147.8* 0.0 - 100.0 pg/mL Final    TELE: NSR with rate  in 90's - low-100's. Frequent PVC's.       ECHOCARDIOGRAM: 06/29/2015 Study Conclusions - Left ventricle: Hyperdynamic LV function but less cavity obliteration than previous and no LVOT gradient The cavity size was normal. Wall thickness was increased in a pattern of moderate LVH. Systolic function was vigorous. The estimated ejection fraction was in the range of 65% to 70%. Wall motion was normal; there were no regional wall motion abnormalities. Doppler parameters are consistent with elevated ventricular end-diastolic filling pressure. - Left atrium: The atrium was mildly dilated. - Atrial septum: No defect or patent foramen ovale was identified.  Radiology/Studies: Dg Chest Port 1 View: 07/07/2015  CLINICAL DATA:  79 year old male with respiratory failure. Initial encounter. Metastatic prostate cancer. EXAM:  PORTABLE CHEST 1 VIEW COMPARISON:  07/05/2015 and earlier. FINDINGS: Portable AP semi upright view at at 0440 hours. Stable left IJ central line. Continued confluent platelike opacity at the left lung base. Atelectasis along the right minor fissure has decreased. Small bilateral pleural effusions were more apparent on the 07/05/2015 CT. Pulmonary vascularity is stable. Stable cardiac size and mediastinal contours. IMPRESSION: Overall stable ventilation since 07/05/2015. Left greater than right pulmonary atelectasis and small pleural effusions. Electronically Signed   By: Genevie Ann M.D.   On: 07/07/2015 07:02     Current Medications:  . antiseptic oral rinse  7 mL Mouth Rinse q12n4p  . atorvastatin  10 mg Oral q1800  . brimonidine  1 drop Right Eye Daily   And  . timolol  1 drop Right Eye Daily  . carvedilol  3.125 mg Oral BID WC  . chlorhexidine  15 mL Mouth Rinse BID  . heparin subcutaneous  5,000 Units Subcutaneous 3 times per day  . insulin aspart  0-5 Units Subcutaneous QHS  . insulin aspart  0-9 Units Subcutaneous TID WC  . metroNIDAZOLE  500 mg Oral 3 times per day  . prednisoLONE acetate  1 drop Both Eyes BID  . saccharomyces boulardii  250 mg Oral BID  . tamsulosin  0.4 mg Oral Daily  . vancomycin  500 mg Oral 4 times per day   . lactated ringers Stopped (07/07/15 2300)    ASSESSMENT AND PLAN:  1. Elevated Troponin - cyclic troponin values have been 0.11, 0.12, and 0.16 in the setting of septic shock secondary to C.diff colitis. - likely to represent demand ischemia in the setting of his acute illness. - denies any anginal symptoms. EKG shows no acute ischemic changes. - would not plan for any invasive workup at this time.  2. Chronic Diastolic CHF - EF [id number] in recent echo with no wall motion abnormalities. - has lower extremity edema on examination. Is +5.6L this admission due to need for hydration in setting of septic shock. - consider restarting home Lasix dose of 20mg   daily.   3. HTN - BP has been 123/62 - 138/85 in the past 24 hours - will restart home medications as BP allows.   4. Sinus tachycardia with Frequent PAC's - His Diltiazem 360mg  daily was stopped during his last admission and replaced with Carvedilol 12.5mg  BID. However, both were listed on his discharge summary as "TO TAKE". Would continue with Carvedilol for now and increase as BP allows. Appears his HR was better controlled on Carvedilol than Cardizem, so could hopefully only do Carvedilol for now. Will increase Carvedilol from 3.125mg  BID to 6.25mg  BID today.  5. Septic Shock secondary to Severe C.diff Colitis - currently off pressors - on Abx treatment -  per Critical Care  Signed, Erma Heritage , [place] 9:24 AM 07/08/2015 Pager: [phone]

## 2015-07-08 NOTE — Progress Notes (Signed)
Patient ID: Jeremy Jennings, male   DOB: 1935-08-27, 79 y.o.   MRN: [id number]  [facility] PROGRESS NOTE  Jeremy Jennings [id number] DOB: [birth year] DOA: 07/04/2015 PCP: [name], MD   Brief narrative:    79 y.o. Male who presented from NH with complaints of fever and diarrhea. He was recently hospitalized for SBO requiring ex-lap complicated by brief cardiac arrest as well as UTI on vanc/Zosyn. He was discharged 12/24.   MICROBIOLOGY: Urine Ctx 12/27>>> Blood Ctx x2 12/24>>> Stool C diff 12/25: Ag & Toxin Positive  Assessment/Plan:    Principal Problem:   Septic shock ([place]) - Secondary to Severe C diff colitis - Switching IV Flagyl to PO Flagyl q8hr Day #4/14 - D/C Vancomycin Enema - Vancomycin PO Day #4/14 - Monitor for fever    Acute hypoxic respiratory failure  - secondary to atelectasis  - OOB to chair - keep on oxygen via [place] to maintain oxygen saturation > 92 %  Active Problems:   Acute encephalopathy - resolved     Acute and chronic diastolic (congestive) heart failure (HCC) - still significantly volume overloaded - cardiology following - continue lasix but change to IV as weight is up from 198 --> 219 lbs since admission     Elevated Troponin I - likely secondary to acute illness - no chest pain this AM - per cardiology     Acute Renal Failure with metabolic acidosis  - pre renal and secondary to sepsis - resolved     Hypokalemia and hypocalcemia  - continue to supplement and repeat BMP in AM    Hypernatremia - monitor on Lasix  - BMP in AM    Anemia of chronic disease - no signs of bleeding - CBC in AM    DM2  - Changing Accu-Checks qAC & HS - Low dose SSI  DVT prophylaxis - Heparin SQ  Code Status: Full.  Family Communication:  plan of care discussed with the patient Disposition Plan: Home when cleared by surgery and cardiology team   IV lines and tubes:  Left IJ CVL 12/25>>> Foley Catheter 12/26>>> Rectal Tube  12/26>>>  Procedures and diagnostic studies:    Dg Chest 1 View 07/02/2015 Hypoinflation with bibasilar opacification likely atelectasis although cannot exclude infection.   Dg Chest 2 View 07/05/2015 Small pleural effusions and bibasilar atelectasis better seen on today's dedicated CT chest.   Ct Chest W Contrast 07/05/2015  Trace bilateral pleural effusions, with mild bibasilar atelectasis. No definite evidence of pneumonia. 2. Apparent mild wall thickening along the rectum raises question for mild proctitis. Mild nonspecific presacral stranding noted. 3. Delayed excretion of contrast from the kidneys, concerning for renal insufficiency. 4. Diffuse coronary artery calcifications seen. 5. 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 6. Scattered bilateral renal cysts seen. 7. Diffuse sclerotic lesions throughout the visualized osseous structures, reflecting known metastatic prostate cancer.   Dg Chest Port 1 View 07/07/2015  Overall stable ventilation since 07/05/2015. Left greater than right pulmonary atelectasis and small pleural effusions.  Medical Consultants:  Surgery  Cardiology   Other Consultants:  PT  IAnti-Infectives:   Flagyl IV 12/25>>> Vancomycin PO 12/25>>> Vancomycin PR 12/25>>> Zosyn 12/24 - 12/25 Vancomycin IV 12/24 - 12/25  [name], MD  [facility] Pager [phone]  If 7PM-7AM, please contact night-coverage [email or web address] Password [facility] 07/08/2015, 2:29 PM   LOS: 3 days   HPI/Subjective: No events overnight.   Objective: Filed Vitals:   07/08/15 0500 07/08/15 0600 07/08/15  0800 07/08/15 1200  BP:   138/75 127/79  Pulse: 67 103 101 99  Temp:   97.8 F (36.6 C) 97.9 F (36.6 C)  TempSrc:    Oral  Resp: 20 23 20 21   Height:   6' (1.829 m)   Weight: 99.6 kg (219 lb 9.3 oz)     SpO2: 100% 98% 100% 100%    Intake/Output Summary (Last 24 hours) at 07/08/15 1429 Last data filed at 07/08/15 0800  Gross per 24 hour   Intake    490 ml  Output   1275 ml  Net   -785 ml    Exam:   General:  Pt is alert, follows commands appropriately, not in acute distress  Cardiovascular: Regular rate and rhythm, S1/S2, no murmurs, no rubs, no gallops  Respiratory: Clear to auscultation bilaterally, no wheezing, no crackles, no rhonchi  Abdomen: Soft, non tender, non distended, bowel sounds present, no guarding  Extremities: LE edema +2 bilateral  Data Reviewed: Basic Metabolic Panel:  Recent Labs Lab 07/02/15 0500  07/05/15 0810 07/05/15 1600 07/06/15 0400 07/07/15 0500 07/07/15 1258 07/08/15 0515  NA 141  < > 142 143 144 146* 147* 147*  K 3.7  < > 2.7* 4.3 4.2 3.2* 2.9* 3.0*  CL 108  < > 117* 115* 115* 117* 115* 120*  CO2 23  < > 16* 16* 15* 15*  --  15*  GLUCOSE 82  < > 113* 148* 141* 137* 139* 168*  BUN 17  < > 19 21* 23* 27* 23* 26*  CREATININE 0.98  < > 1.45* 1.50* 1.53* 1.46* 1.20 1.09  CALCIUM 6.3*  < > 5.6* 5.8* 5.8* 5.7*  --  5.9*  MG 1.9  --  1.5* 1.6* 1.6*  --   --  2.4  PHOS 2.0*  --  2.1* 3.8 3.4  --   --  1.7*  < > = values in this interval not displayed. Liver Function Tests:  Recent Labs Lab 07/04/15 2325 07/06/15 0400 07/07/15 0500 07/08/15 0515  AST 60* 60* 25  --   ALT 45 34 25  --   ALKPHOS 360* 486* 494*  --   BILITOT 0.8 0.4 0.6  --   PROT 5.9* 5.6* 5.6*  --   ALBUMIN 2.0* 2.0* 1.8* 1.8*    Recent Labs Lab 07/04/15 2325  LIPASE 52*   No results for input(s): AMMONIA in the last 168 hours. CBC:  Recent Labs Lab 07/04/15 2325 07/05/15 0810  07/05/15 1600 07/06/15 0400 07/07/15 0500 07/07/15 1258 07/08/15 0515  WBC 5.4 5.7  --  12.1* 13.1* 15.4*  --  13.2*  NEUTROABS 3.9  --   --   --  11.5*  --   --  11.1*  HGB 8.1* 6.0*  < > 8.7* 8.2* 8.6* 9.5* 8.9*  HCT 25.5* 18.6*  < > 26.3* 24.2* 25.3* 28.0* 26.7*  MCV 92.1 91.6  --  89.5 86.7 85.8  --  86.4  PLT 252 202  --  206 205 206  --  193  < > = values in this interval not displayed. Cardiac  Enzymes:  Recent Labs Lab 07/05/15 0955 07/05/15 1457 07/05/15 2100  TROPONINI 0.11* 0.12* 0.16*   BNP: Invalid input(s): POCBNP CBG:  Recent Labs Lab 07/07/15 1138 07/07/15 1537 07/07/15 2146 07/08/15 0805 07/08/15 1207  GLUCAP 118* 160* 139* 151* 167*    Recent Results (from the past 240 hour(s))  Culture, blood (routine x 2)  Status: None   Collection Time: 06/28/15 10:20 PM  Result Value Ref Range Status   Specimen Description BLOOD LEFT ARM  Final   Special Requests BOTTLES DRAWN AEROBIC AND ANAEROBIC [place]  Final   Culture   Final    NO GROWTH 5 DAYS Performed at [facility]    Report Status 07/04/2015 FINAL  Final  Culture, blood (routine x 2)     Status: None   Collection Time: 06/28/15 10:25 PM  Result Value Ref Range Status   Specimen Description BLOOD LEFT HAND  Final   Special Requests BOTTLES DRAWN AEROBIC AND ANAEROBIC [facility]  Final   Culture   Final    NO GROWTH 5 DAYS Performed at [facility]    Report Status 07/04/2015 FINAL  Final  Blood Culture (routine x 2)     Status: None (Preliminary result)   Collection Time: 07/04/15 11:25 PM  Result Value Ref Range Status   Specimen Description BLOOD LEFT ANTECUBITAL  Final   Special Requests IN PEDIATRIC BOTTLE 3ML  Final   Culture   Final    NO GROWTH 2 DAYS Performed at [facility]    Report Status PENDING  Incomplete  Blood Culture (routine x 2)     Status: None (Preliminary result)   Collection Time: 07/04/15 11:25 PM  Result Value Ref Range Status   Specimen Description BLOOD RIGHT ANTECUBITAL  Final   Special Requests BOTTLES DRAWN AEROBIC AND ANAEROBIC 5ML  Final   Culture   Final    NO GROWTH 2 DAYS Performed at [facility]    Report Status PENDING  Incomplete  C difficile quick scan w PCR reflex     Status: Abnormal   Collection Time: 07/05/15  2:34 AM  Result Value Ref Range Status   C Diff antigen POSITIVE (A) NEGATIVE Final   C Diff toxin POSITIVE  (A) NEGATIVE Final   C Diff interpretation Positive for toxigenic C. difficile  Final    Comment: CRITICAL RESULT CALLED TO, READ BACK BY AND VERIFIED WITH: MARQUEZ,A/ED @0341  ON 07/05/15 BY KARCZEWSKI,S.   MRSA PCR Screening     Status: None   Collection Time: 07/05/15  7:00 AM  Result Value Ref Range Status   MRSA by PCR NEGATIVE NEGATIVE Final    Comment:        The GeneXpert MRSA Assay (FDA approved for NASAL specimens only), is one component of a comprehensive MRSA colonization surveillance program. It is not intended to diagnose MRSA infection nor to guide or monitor treatment for MRSA infections.   Urine culture     Status: None   Collection Time: 07/07/15  6:24 AM  Result Value Ref Range Status   Specimen Description URINE, CATHETERIZED  Final   Special Requests NONE  Final   Culture   Final    NO GROWTH 1 DAY Performed at [facility]    Report Status 07/08/2015 FINAL  Final     Scheduled Meds: . antiseptic oral rinse  7 mL Mouth Rinse q12n4p  . atorvastatin  10 mg Oral q1800  . brimonidine  1 drop Right Eye Daily   And  . timolol  1 drop Right Eye Daily  . carvedilol  6.25 mg Oral BID WC  . chlorhexidine  15 mL Mouth Rinse BID  . furosemide  20 mg Oral Daily  . heparin subcutaneous  5,000 Units Subcutaneous 3 times per day  . insulin aspart  0-5 Units Subcutaneous QHS  .  insulin aspart  0-9 Units Subcutaneous TID WC  . lactose free nutrition  237 mL Oral BID BM  . metroNIDAZOLE  500 mg Oral 3 times per day  . prednisoLONE acetate  1 drop Both Eyes BID  . saccharomyces boulardii  250 mg Oral BID  . tamsulosin  0.4 mg Oral Daily  . vancomycin  500 mg Oral 4 times per day   Continuous Infusions: . lactated ringers Stopped (07/07/15 2300)

## 2015-07-08 NOTE — Progress Notes (Signed)
Pharmacy Antibiotic Follow-up Note  Jeremy Jennings is a 79 y.o. year-old male admitted on 07/04/2015.  The patient is currently on day 4 of 14 for CDiff.  Assessment/Plan: 79 yo from NH presented to ER with fever and diarrhea. Note patient was just discharged for SBO requiring ex lap complicated by brief cardiac arrest and UTI treated with Vanc/Zosyn. Patient in septic shock due to severe Cdiff, treat with PO vanc and IV Flagyl, added Vanc rectally.  Currently on Vancomycin 500mg  po qid, Metronidazole 500mg  po q8h  Temp (24hrs), Avg:97.7 F (36.5 C), Min:97.3 F (36.3 C), Max:98.3 F (36.8 C)   Recent Labs Lab 07/05/15 0810 07/05/15 1600 07/06/15 0400 07/07/15 0500 07/08/15 0515  WBC 5.7 12.1* 13.1* 15.4* 13.2*    Recent Labs Lab 07/05/15 1600 07/06/15 0400 07/07/15 0500 07/07/15 1258 07/08/15 0515  CREATININE 1.50* 1.53* 1.46* 1.20 1.09   Estimated Creatinine Clearance: 69.4 mL/min (by C-G formula based on Cr of 1.09).    No Known Allergies  Antimicrobials this admission: 12/25 >> IV>PO Flagyl >> 12/25 >> PO Vanc >> 12/25 >> PR Vanc >> 12/27   Microbiology results: 12/24 Blood: ngtd 12/27 Urine: sent 12/25 Cdiff: positive tox and Ag MRSA neg  Thank you for allowing pharmacy to be a part of this patient's care.  Minda Ditto PharmD 07/08/2015 10:51 AM

## 2015-07-08 NOTE — NC FL2 (Signed)
[place] LEVEL OF CARE SCREENING TOOL     IDENTIFICATION  Patient Name: Jeremy Jennings Birthdate: 1936/06/30 Sex: male Admission Date (Current Location): 07/04/2015  [facility] and [place] Number:  Herbalist and Address:  [facility],  [place] [address] [place]      Provider Number: [id number]  Attending Physician Name and Address:  Theodis Blaze, MD  Relative Name and Phone Number:       Current Level of Care: SNF Recommended Level of Care: [place] Prior Approval Number:    Date Approved/Denied:   PASRR Number: [id number]  Discharge Plan: SNF    Current Diagnoses: Patient Active Problem List   Diagnosis Date Noted  . C. difficile colitis   . Acute renal failure ([place])   . Elevated troponin I level   . Sepsis ([place]) 07/05/2015  . Septic shock ([place]) 07/05/2015  . Aspiration of vomitus   . Cardiac arrest due to respiratory disorder ([place]) 06/24/2015  . 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 Acute diastolic CHF (congestive heart failure), NYHA class 4 ([place]) 06/23/2015  . Acute respiratory failure ([place])   . Enterococcus UTI   . HCAP (healthcare-associated pneumonia) 06/19/2015  . Sinus tachycardia ([place]) 06/19/2015  . Pedal edema 06/19/2015  . Generalized weakness 06/19/2015  . Metabolic acidosis 0000000  . Type 2 diabetes mellitus ([place]) 06/19/2015  . SBO (small bowel obstruction) ischemic s/p LOA & SB resection 06/12/2015 06/11/2015  . Acute GI bleeding 05/27/2015  . GI bleed 05/27/2015  . Low hemoglobin 12/10/2014  . Trigger finger of both hands 12/10/2014  . Anemia 11/09/2014  . Diabetes ([place]) 04/21/2014  . Hypotestosteronism 04/21/2014  . Cancer ([place]) 04/21/2014  . Prostate cancer ([place]) 11/20/2013  . Elevated PSA 08/29/2013  . Chronic kidney disease, stage II (mild) 08/08/2013  . BPH associated with nocturia 07/24/2013  . Benign hypertensive heart disease without heart  failure 07/24/2013  . PAC (premature atrial contraction) 07/24/2012  . Bursitis of left shoulder 07/15/2011  . Hyperlipidemia   . Hypertension     Orientation RESPIRATION BLADDER Height & Weight    Self, Time, Situation, Place  Normal Incontinent 6' (182.9 cm) 219 lbs.  BEHAVIORAL SYMPTOMS/MOOD NEUROLOGICAL BOWEL NUTRITION STATUS  Other (Comment) (No Behaviors)   Incontinent Diet (regular)  AMBULATORY STATUS COMMUNICATION OF NEEDS Skin   Limited Assist Verbally Surgical wounds                       Personal Care Assistance Level of Assistance  Bathing, Feeding, Dressing Bathing Assistance: Limited assistance Feeding assistance: Independent Dressing Assistance: Limited assistance     Functional Limitations Info  Sight, Hearing, Speech Sight Info: Adequate Hearing Info: Adequate Speech Info: Adequate    SPECIAL CARE FACTORS FREQUENCY  PT (By licensed PT), OT (By licensed OT)     PT Frequency: 5 x wk OT Frequency: 5 x wk            Contractures Contractures Info: Not present    Additional Factors Info  Code Status Code Status Info: Full Code Allergies Info: No Known Allergies   Insulin Sliding Scale Info: 4 x daily       Current Medications (07/08/2015):  This is the current hospital active medication list Current Facility-Administered Medications  Medication Dose Route Frequency Provider Last Rate Last Dose  . acetaminophen (TYLENOL) tablet 650 mg  650 mg Oral Q4H PRN Luella Cook, MD      .  antiseptic oral rinse (CPC / CETYLPYRIDINIUM CHLORIDE 0.05%) solution 7 mL  7 mL Mouth Rinse q12n4p Javier Glazier, MD   7 mL at 07/07/15 1537  . atorvastatin (LIPITOR) tablet 10 mg  10 mg Oral q1800 Javier Glazier, MD   10 mg at 07/07/15 1659  . brimonidine (ALPHAGAN) 0.2 % ophthalmic solution 1 drop  1 drop Right Eye Daily Theodis Blaze, MD   1 drop at 07/08/15 0931   And  . timolol (TIMOPTIC) 0.5 % ophthalmic solution 1 drop  1 drop Right Eye Daily  Theodis Blaze, MD   1 drop at 07/08/15 0931  . carvedilol (COREG) tablet 6.25 mg  6.25 mg Oral BID WC [place], [place]      . chlorhexidine (PERIDEX) 0.12 % solution 15 mL  15 mL Mouth Rinse BID Javier Glazier, MD   15 mL at 07/08/15 1000  . heparin injection 5,000 Units  5,000 Units Subcutaneous 3 times per day Javier Glazier, MD   5,000 Units at 07/08/15 [id number]  . insulin aspart (novoLOG) injection 0-5 Units  0-5 Units Subcutaneous QHS Javier Glazier, MD   0 Units at 07/07/15 2130  . insulin aspart (novoLOG) injection 0-9 Units  0-9 Units Subcutaneous TID WC Javier Glazier, MD   2 Units at 07/08/15 0800  . lactated ringers infusion   Intravenous Continuous Javier Glazier, MD   Stopped at 07/07/15 2300  . lactose free nutrition (BOOST PLUS) liquid 237 mL  237 mL Oral BID BM Satira Anis Ward, RD      . metroNIDAZOLE (FLAGYL) tablet 500 mg  500 mg Oral 3 times per day Javier Glazier, MD   500 mg at 07/08/15 0514  . ondansetron (ZOFRAN) injection 4 mg  4 mg Intravenous Q6H PRN Luella Cook, MD      . phenol (CHLORASEPTIC) mouth spray 1 spray  1 spray Mouth/Throat PRN Praveen Mannam, MD      . prednisoLONE acetate (PRED FORTE) 1 % ophthalmic suspension 1 drop  1 drop Both Eyes BID Javier Glazier, MD   1 drop at 07/08/15 0931  . saccharomyces boulardii (FLORASTOR) capsule 250 mg  250 mg Oral BID Praveen Mannam, MD   250 mg at 07/08/15 0931  . tamsulosin (FLOMAX) capsule 0.4 mg  0.4 mg Oral Daily Javier Glazier, MD   0.4 mg at 07/08/15 0931  . vancomycin (VANCOCIN) 50 mg/mL oral solution 500 mg  500 mg Oral 4 times per day Marshell Garfinkel, MD   500 mg at 07/08/15 [id number]     Discharge Medications: Please see discharge summary for a list of discharge medications.  Relevant Imaging Results:  Relevant Lab Results:   Additional Information Pt is being treated for c-diff. SS # [id number]  Patriciaann Rabanal, Randall An, LCSW

## 2015-07-08 NOTE — Clinical Social Work Note (Signed)
Clinical Social Work Assessment  Patient Details  Name: Jeremy Jennings MRN: [id number] Date of Birth: March 14, 1936  Date of referral:  07/08/15               Reason for consult:  Facility Placement, Discharge Planning                Permission sought to share information with:  Chartered certified accountant granted to share information::  Yes, Verbal Permission Granted  Name::        Agency::     Relationship::     Contact Information:     Housing/Transportation Living arrangements for the past 2 months:  Single Family Home Source of Information:  Patient, Adult Children Patient Interpreter Needed:  None Criminal Activity/Legal Involvement Pertinent to Current Situation/Hospitalization:  No - Comment as needed Significant Relationships:  Adult Children Lives with:  Self, Facility Resident Do you feel safe going back to the place where you live?  No (Pt / daughter requesting new placement.) Need for family participation in patient care:  Yes (Comment)  Care giving concerns:  Pt / daughter feel SNF was unable to manage pt's care.   Social Worker assessment / plan: Pt hospitalized on 07/04/15 with acute hypoxic respiratory failure. Pt was d/c to [facility] on 07/03/15 from [facility] for ST Rehab. PN reviewed. CSW met with pt and spoke to pt's daughter, [name] [phone], to assist with d/c planning. Both pt / daughter agree with PT recommendations that continued SNF placement, for rehab, is needed. Neither pt / nor daughter were pleased with [facility] and are requesting a new facility.  New SNF search initiated and bed offers are pending. CSW will continue to follow to assist with d/c planning to SNF.  Employment status:  Retired Nurse, adult PT Recommendations:  [place] / Referral to community resources:  [place]  Patient/Family's Response to care:  Pt / daughter feel pt would benefit from continued  rehab.  Patient/Family's Understanding of and Emotional Response to Diagnosis, Current Treatment, and Prognosis:  Pt / daughter are aware of pt's medical status. Daughter will tour SNF's once bed offers have been received. Daughter reports that if she is not pleased with bed offers she will consider pt discharging  home with 24/7 hired assistance.  Emotional Assessment Appearance:  Appears stated age Attitude/Demeanor/Rapport:  Other (cooperative) Affect (typically observed):  Calm, Pleasant, Appropriate Orientation:  Oriented to Self, Oriented to Place, Oriented to  Time, Oriented to Situation Alcohol / Substance use:  Not Applicable Psych involvement (Current and /or in the community):  No (Comment)  Discharge Needs  Concerns to be addressed:  Discharge Planning Concerns Readmission within the last 30 days:  Yes Current discharge risk:  None Barriers to Discharge:  No Barriers Identified   Luretha Rued, [place] 07/08/2015, 12:15 PM

## 2015-07-08 NOTE — Clinical Social Work Placement (Signed)
   CLINICAL SOCIAL WORK PLACEMENT  NOTE  Date:  07/08/2015  Patient Details  Name: Jeremy Jennings MRN: [id number] Date of Birth: [birth year]  Clinical Social Work is seeking post-discharge placement for this patient at the [place] level of care (*CSW will initial, date and re-position this form in  chart as items are completed):  No   Patient/family provided with [place] Work Department's list of facilities offering this level of care within the geographic area requested by the patient (or if unable, by the patient's family).  Yes   Patient/family informed of their freedom to choose among providers that offer the needed level of care, that participate in Medicare, Medicaid or managed care program needed by the patient, have an available bed and are willing to accept the patient.  No   Patient/family informed of [place]'s ownership interest in [facility] and [facility], as well as of the fact that they are under no obligation to receive care at these facilities.  PASRR submitted to EDS on       PASRR number received on       Existing PASRR number confirmed on 07/08/15     FL2 transmitted to all facilities in geographic area requested by pt/family on 07/08/15     FL2 transmitted to all facilities within larger geographic area on       Patient informed that his/her managed care company has contracts with or will negotiate with certain facilities, including the following:            Patient/family informed of bed offers received.  Patient chooses bed at       Physician recommends and patient chooses bed at      Patient to be transferred to   on  .  Patient to be transferred to facility by       Patient family notified on   of transfer.  Name of family member notified:        PHYSICIAN       Additional Comment:    _______________________________________________ Luretha Rued, [place] 07/08/2015, 12:30  PM

## 2015-07-08 NOTE — Progress Notes (Signed)
Date: July 08, 2015 Chart reviewed for concurrent status and case management needs. Will continue to follow patient for changes and needs: Velva Harman, RN, BSN, [place]   [phone]

## 2015-07-08 NOTE — Telephone Encounter (Signed)
Pt currently admitted.  Will follow up tomorrow, 12/29.

## 2015-07-08 NOTE — Progress Notes (Signed)
Nutrition Follow-up  DOCUMENTATION CODES:   Not applicable  INTERVENTION:  -RD Continue to monitor for needs -Boost Plus BID, each supplement provides 360 kcal and 14 grams of protein    NUTRITION DIAGNOSIS:   Inadequate oral intake related to inability to eat as evidenced by NPO status.  -resolving with diet advancement, per pt, intake is getting better  GOAL:   Patient will meet greater than or equal to 90% of their needs  Not yet met  MONITOR:   PO intake, Supplement acceptance, Weight trends, I & O's, Labs, Skin  REASON FOR ASSESSMENT:   Malnutrition Screening Tool    ASSESSMENT:   Jeremy Jennings is a 79 y.o. male with PMHx listed below who presents from NH with complaints of fever and diarrhea. He was recently hospitalized for SBO requiring ex-lap complicated by brief cardiac arrest as well as UTI on vanc/Zosyn. He was discharged yesterday.  At the rehab facility, he was ambulating around without walker for short distances, talking and conversational, and close to his previous baseline (he lives independently and still drives). This morning and afternoon, per pts daughter, there was a clinical change where he was lethargic and febrile with abominal pain and distension. He was subsequently sent from SNF to the hospital where he was noted to have a T of 105F, tachycardic, tachycpneic and with initially soft blood pressure and hypoxic. The patient had four watery BMs while in the ED, and was found to be c.diff +.He received 4L IVF + antibiotics.  Jeremy Jennings was found to be C Diff+ and has had multiple watery bowel movements since admission, but it is improving. He initially had septic shock and was on multiple pressors, that has since resolved.  He has been found to have Acute Hypoxic Respiratory failure, secondary to L Atelectasis, discussed drinking liquids to help manage calories, pt agreed to Boost Plus similar to previous admit.   Pt reports to be eating ok, no  PO intake in chart at this point in time.  Per MD note, he is to restart some of his home meds today.  Continue to follow for PO intake.  Labs and Medications reviewed  Diet Order:  Diet regular Room service appropriate?: Yes; Fluid consistency:: Thin  Skin:  Wound (see comment) (Closed incision to abdomen.)  Last BM:  07/06/2015  Height:   Ht Readings from Last 1 Encounters:  06/23/15 6' (1.829 m)    Weight:   Wt Readings from Last 1 Encounters:  07/08/15 219 lb 9.3 oz (99.6 kg)    Ideal Body Weight:  80.91 kg  BMI:  Body mass index is 29.77 kg/(m^2).  Estimated Nutritional Needs:   Kcal:  2000-2200  Protein:  100-110g  Fluid:  2L/day  EDUCATION NEEDS:   No education needs identified at this time  Jeremy Anis. Mintie Witherington, MS, RD LDN After Hours/Weekend Pager [phone]

## 2015-07-09 LAB — GLUCOSE, CAPILLARY
GLUCOSE-CAPILLARY: 170 mg/dL — AB (ref 65–99)
GLUCOSE-CAPILLARY: 165 mg/dL — AB (ref 65–99)
Glucose-Capillary: 194 mg/dL — ABNORMAL HIGH (ref 65–99)
Glucose-Capillary: 249 mg/dL — ABNORMAL HIGH (ref 65–99)
Glucose-Capillary: 182 mg/dL — ABNORMAL HIGH (ref 65–99)

## 2015-07-09 LAB — CBC WITH DIFFERENTIAL/PLATELET
Basophils Absolute: 0.1 K/uL (ref 0.0–0.1)
Basophils Relative: 1 %
Eosinophils Absolute: 0.3 K/uL (ref 0.0–0.7)
Eosinophils Relative: 3 %
HCT: 26.5 % — ABNORMAL LOW (ref 39.0–52.0)
Hemoglobin: 8.7 g/dL — ABNORMAL LOW (ref 13.0–17.0)
Lymphocytes Relative: 10 %
Lymphs Abs: 1 K/uL (ref 0.7–4.0)
MCH: 28.4 pg (ref 26.0–34.0)
MCHC: 32.8 g/dL (ref 30.0–36.0)
MCV: 86.6 fL (ref 78.0–100.0)
Monocytes Absolute: 1.2 K/uL — ABNORMAL HIGH (ref 0.1–1.0)
Monocytes Relative: 12 %
Neutro Abs: 7.1 K/uL (ref 1.7–7.7)
Neutrophils Relative %: 74 %
Platelets: 184 K/uL (ref 150–400)
RBC: 3.06 MIL/uL — ABNORMAL LOW (ref 4.22–5.81)
RDW: 18 % — ABNORMAL HIGH (ref 11.5–15.5)
WBC: 9.7 K/uL (ref 4.0–10.5)

## 2015-07-09 LAB — RENAL FUNCTION PANEL
ALBUMIN: 1.7 g/dL — AB (ref 3.5–5.0)
ANION GAP: 13 (ref 5–15)
BUN: 20 mg/dL (ref 6–20)
CALCIUM: 5.7 mg/dL — AB (ref 8.9–10.3)
CHLORIDE: 115 mmol/L — AB (ref 101–111)
CO2: 17 mmol/L — ABNORMAL LOW (ref 22–32)
CREATININE: 1.06 mg/dL (ref 0.61–1.24)
GFR calc Af Amer: >60 mL/min (ref >60)
GFR calc non Af Amer: >60 mL/min (ref >60)
Glucose, Bld: 186 mg/dL — ABNORMAL HIGH (ref 65–99)
POTASSIUM: 3.1 mmol/L — AB (ref 3.5–5.1)
Phosphorus: 1.3 mg/dL — ABNORMAL LOW (ref 2.5–4.6)
Sodium: 145 mmol/L (ref 135–145)

## 2015-07-09 LAB — MAGNESIUM: Magnesium: 2.1 mg/dL (ref 1.7–2.4)

## 2015-07-09 MED ORDER — POTASSIUM CHLORIDE CRYS ER 20 MEQ PO TBCR
40.0000 meq | EXTENDED_RELEASE_TABLET | Freq: Once | ORAL | Status: AC
  Administered 2015-07-09: 40 meq via ORAL
  Filled 2015-07-09: qty 2

## 2015-07-09 MED ORDER — SODIUM CHLORIDE 0.9 % IV SOLN
1.0000 g | Freq: Once | INTRAVENOUS | Status: AC
  Administered 2015-07-09: 1 g via INTRAVENOUS
  Filled 2015-07-09: qty 10

## 2015-07-09 MED ORDER — CARVEDILOL 12.5 MG PO TABS
12.5000 mg | ORAL_TABLET | Freq: Two times a day (BID) | ORAL | Status: DC
  Administered 2015-07-09 – 2015-07-17 (×17): 12.5 mg via ORAL
  Filled 2015-07-09 (×20): qty 1

## 2015-07-09 NOTE — Progress Notes (Signed)
Physical Therapy Treatment Patient Details Name: Jeremy Jennings MRN: [id number] DOB: [birth year] Today's Date: 07/09/2015    History of Present Illness 79 yo black male who has a history of stage 4 prostate cancer, HTN, DM, tachycardia, recent GI bleed.  Dx of SBO, s/p resection 06/12/15.  Marland Kitchen Acute respiratoiry failure and cardiac arrest during hospital stay. Intubated 12/13. Extubated 12/14. DC to SNF 12/22, returned 12/24 with c diff.    PT Comments    Patient is motivated to ambulate today. HR  Up to 129 with mobility.  Follow Up Recommendations  SNF;Supervision/Assistance - 24 hour     Equipment Recommendations  None recommended by PT    Recommendations for Other Services       Precautions / Restrictions Precautions Precaution Comments: abd surgery, c diff, flexiseal,     Mobility  Bed Mobility Overal bed mobility: Needs Assistance   Rolling: Max assist Sidelying to sit: HOB elevated;+2 for safety/equipment;+2 for physical assistance;Max assist     Sit to sidelying: +2 for safety/equipment;+2 for physical assistance;Max assist General bed mobility comments: Assist for trunk and bil LEs. Increased time. assist legs onto bed.  Transfers Overall transfer level: Needs assistance Equipment used: Rolling walker (2 wheeled) Transfers: Sit to/from Stand Sit to Stand: Mod assist;+2 physical assistance;+2 safety/equipment;From elevated surface Stand pivot transfers: +2 safety/equipment;Min assist       General transfer comment: assist to power up from bed, decreased control of descent.  Ambulation/Gait Ambulation/Gait assistance: Min assist;+2 physical assistance;+2 safety/equipment Ambulation Distance (Feet): 50 Feet Assistive device: Rolling walker (2 wheeled) Gait Pattern/deviations: Step-through pattern;Decreased stride length;Shuffle     General Gait Details: slow speed,  assist with turning, cues for for safety, HR 129, sats 100% RA.   Stairs             Wheelchair Mobility    Modified Rankin (Stroke Patients Only)       Balance                                    Cognition Arousal/Alertness: Awake/alert Behavior During Therapy: WFL for tasks assessed/performed Overall Cognitive Status: Within Functional Limits for tasks assessed                      Exercises      General Comments        Pertinent Vitals/Pain Faces Pain Scale: Hurts little more Pain Location: legs and abdomen Pain Intervention(s): Monitored during session    Home Living                      Prior Function            PT Goals (current goals can now be found in the care plan section) Progress towards PT goals: Progressing toward goals    Frequency  Min 3X/week    PT Plan Current plan remains appropriate    Co-evaluation             End of Session Equipment Utilized During Treatment: Gait belt Activity Tolerance: Patient tolerated treatment well Patient left: in bed;with call bell/phone within reach;with family/visitor present     Time: [id number] PT Time Calculation (min) (ACUTE ONLY): 22 min  Charges:  $Gait Training: 8-22 mins                    G Codes:  Claretha Cooper 07/09/2015, 4:27 PM Tresa Endo PT [phone]

## 2015-07-09 NOTE — Telephone Encounter (Signed)
Pt currently admitted.  Will follow up tomorrow, 12/30.

## 2015-07-09 NOTE — Progress Notes (Signed)
Patient Name: Jeremy Jennings Date of Encounter: 07/09/2015  Principal Problem:   Septic shock ([place]) Active Problems:   Sepsis ([place])   C. difficile colitis   Acute renal failure ([place])   Elevated troponin I level   Acute diastolic (congestive) heart failure [facility])    Primary Cardiologist: Dr. Mare Ferrari Patient Profile: 79 y.o. male with PMH of HTN, HLD, Type 2 DM, and Stage 4 Prostate Cancer recently admitted from 06/11/2015 to 07/03/2015 for a small bowel obstruction. Represented on 07/04/2015 with Septic Shock secondary to severe C.diff Colitis. Cardiology consulted for elevated troponin values.  SUBJECTIVE: Reports some shortness of breath when lying down last night. Denies any chest pain or palpitations.  OBJECTIVE Filed Vitals:   07/09/15 0100 07/09/15 0200 07/09/15 0400 07/09/15 0600  BP:  142/71 164/76 133/67  Pulse:  101 109 71  Temp: 97.4 F (36.3 C)  98.1 F (36.7 C)   TempSrc: Oral  Axillary   Resp:  21 22 19   Height:      Weight:    215 lb 2.7 oz (97.6 kg)  SpO2:  100% 99% 100%    Intake/Output Summary (Last 24 hours) at 07/09/15 0757 Last data filed at 07/09/15 0400  Gross per 24 hour  Intake    350 ml  Output   2275 ml  Net  -1925 ml   Filed Weights   07/07/15 0600 07/08/15 0500 07/09/15 0600  Weight: 216 lb 0.8 oz (98 kg) 219 lb 9.3 oz (99.6 kg) 215 lb 2.7 oz (97.6 kg)    PHYSICAL EXAM General: Well developed, well nourished, male in no acute distress. Head: Normocephalic, atraumatic.  Neck: Supple without bruits, JVD not elevated. Lungs:  Resp regular and unlabored, Rales at bases bilaterally. Heart: RRR, S1, S2, no S3, S4, or murmur; no rub. Abdomen: Soft, non-tender, non-distended with normoactive bowel sounds. No hepatomegaly. No rebound/guarding. No obvious abdominal masses. Extremities: No clubbing or cyanosis, 1+ edema bilaterally. Distal pedal pulses are 2+ bilaterally. Neuro: Alert and oriented X 3. Moves all extremities  spontaneously. Psych: Normal affect.   LABS: CBC: Recent Labs  07/08/15 0515 07/09/15 0600  WBC 13.2* 9.7  NEUTROABS 11.1* PENDING  HGB 8.9* 8.7*  HCT 26.7* 26.5*  MCV 86.4 86.6  PLT 193 184   INR:No results for input(s): INR in the last 72 hours. Basic Metabolic Panel: Recent Labs  07/07/15 0500 07/07/15 1258 07/08/15 0515  NA 146* 147* 147*  K 3.2* 2.9* 3.0*  CL 117* 115* 120*  CO2 15*  --  15*  GLUCOSE 137* 139* 168*  BUN 27* 23* 26*  CREATININE 1.46* 1.20 1.09  CALCIUM 5.7*  --  5.9*  MG  --   --  2.4  PHOS  --   --  1.7*   Liver Function Tests: Recent Labs  07/07/15 0500 07/08/15 0515  AST 25  --   ALT 25  --   ALKPHOS 494*  --   BILITOT 0.6  --   PROT 5.6*  --   ALBUMIN 1.8* 1.8*   BNP:  B NATRIURETIC PEPTIDE  Date/Time Value Ref Range Status  07/05/2015 09:55 AM 348.0* 0.0 - 100.0 pg/mL Final  06/23/2015 05:30 AM 147.8* 0.0 - 100.0 pg/mL Final    TELE: NSR with rate in 90's - low-100's. Frequent PVC's.        ECHOCARDIOGRAM: 06/29/2015 Study Conclusions - Left ventricle: Hyperdynamic LV function but less cavity obliteration than previous and no LVOT gradient The cavity  size was normal. Wall thickness was increased in a pattern of moderate LVH. Systolic function was vigorous. The estimated ejection fraction was in the range of 65% to 70%. Wall motion was normal; there were no regional wall motion abnormalities. Doppler parameters are consistent with elevated ventricular end-diastolic filling pressure. - Left atrium: The atrium was mildly dilated. - Atrial septum: No defect or patent foramen ovale was identified.   Current Medications:  . antiseptic oral rinse  7 mL Mouth Rinse q12n4p  . atorvastatin  10 mg Oral q1800  . brimonidine  1 drop Right Eye Daily   And  . timolol  1 drop Right Eye Daily  . carvedilol  6.25 mg Oral BID WC  . chlorhexidine  15 mL Mouth Rinse BID  . furosemide  40 mg Intravenous BID  . heparin  subcutaneous  5,000 Units Subcutaneous 3 times per day  . insulin aspart  0-5 Units Subcutaneous QHS  . insulin aspart  0-9 Units Subcutaneous TID WC  . lactose free nutrition  237 mL Oral BID BM  . metroNIDAZOLE  500 mg Oral 3 times per day  . potassium chloride  40 mEq Oral Once  . prednisoLONE acetate  1 drop Both Eyes BID  . saccharomyces boulardii  250 mg Oral BID  . tamsulosin  0.4 mg Oral Daily  . vancomycin  500 mg Oral 4 times per day   . lactated ringers Stopped (07/07/15 2300)    ASSESSMENT AND PLAN: 1. Elevated Troponin - cyclic troponin values have been 0.11, 0.12, and 0.16 in the setting of septic shock secondary to C.diff colitis. - likely to represent demand ischemia in the setting of his acute illness. - denies any anginal symptoms. EKG shows no acute ischemic changes. - would not plan for any invasive workup at this time.  2. Acute on Chronic Diastolic CHF - EF [id number] in recent echo with no wall motion abnormalities. - Is +3.8L this admission due to need for hydration in setting of septic shock. - Continue with daily weights and strict I&O's. - Started on IV Lasix 40mg  BID on 07/08/2015 with net output of almost 2 L. Weight was 198 lbs on admission, peaked at 219 lbs on 07/08/2015. At 215 lbs on 07/09/2015. Would continue with this current dose for now.   3. HTN - BP has been 127/63 - 164/79 in the past 24 hours - will restart home medications as BP allows.   4. Sinus tachycardia with Frequent PAC's - His Diltiazem 360mg  daily was stopped during his last admission and replaced with Carvedilol 12.5mg  BID. However, both were listed on his discharge summary as "TO TAKE". Would continue with Carvedilol for now and increase as BP allows. Appears his HR was better controlled on Carvedilol than Cardizem, so could hopefully only do Carvedilol for now. Will increase Carvedilol to PTA dose of 12.5mg  BID.  5. Septic Shock secondary to Severe C.diff Colitis - currently off  pressors - on Abx treatment - per admitting team  6. Hypokalemia - was 3.0 on 07/08/2015 and replaced. - BMET for today is pending.  Signed, Erma Heritage , PA-C 7:57 AM 07/09/2015 Pager: [phone]

## 2015-07-09 NOTE — Progress Notes (Signed)
Patient ID: Jeremy Jennings, male   DOB: 1935-09-08, 79 y.o.   MRN: [id number]  [facility] PROGRESS NOTE  [facility] [id number] DOB: 12/23/1935 DOA: 07/04/2015 PCP: [name], MD   Brief narrative:    79 y.o. Male who presented from NH with complaints of fever and diarrhea. He was recently hospitalized for SBO requiring ex-lap complicated by brief cardiac arrest as well as UTI on vanc/Zosyn. He was discharged 12/24.   MICROBIOLOGY: Urine Ctx 12/27>>> Blood Ctx x2 12/24>>> Stool C diff 12/25: Ag & Toxin Positive  Assessment/Plan:    Principal Problem:   Septic shock (HCC) - secondary to Severe C diff colitis - Continue with IV Flagyl to PO Flagyl q8hr Day #5/14 - D/C Vancomycin Enema - Vancomycin PO Day #5/14 - Monitor for fever    Acute hypoxic respiratory failure  - secondary to atelectasis  - OOB to chair - keep on oxygen via [place] to maintain oxygen saturation > 92 %  Active Problems:   Acute encephalopathy - resolved     Acute and chronic diastolic (congestive) heart failure (HCC) - still significantly volume overloaded - cardiology following - continue lasix but change to IV as weight is up from 198 --> 219 lbs since admission --> 215 lbs this AM     Elevated Troponin I - likely secondary to acute illness - no chest pain this AM - per cardiology     Acute Renal Failure with metabolic acidosis  - pre renal and secondary to sepsis - resolved     Hypokalemia and hypocalcemia  - continue to supplement and repeat BMP in AM    Hypernatremia - monitor on Lasix  - resolved and WNL this AM    Anemia of chronic disease - no signs of bleeding - CBC in AM    DM2  - Changing Accu-Checks qAC & HS - Low dose SSI  DVT prophylaxis - Heparin SQ  Code Status: Full.  Family Communication:  plan of care discussed with the patient Disposition Plan: Home when cleared by surgery and cardiology team   IV lines and tubes:  Left IJ CVL  12/25>>> Foley Catheter 12/26>>> Rectal Tube 12/26>>>  Procedures and diagnostic studies:    Dg Chest 1 View 07/02/2015 Hypoinflation with bibasilar opacification likely atelectasis although cannot exclude infection.   Dg Chest 2 View 07/05/2015 Small pleural effusions and bibasilar atelectasis better seen on today's dedicated CT chest.   Ct Chest W Contrast 07/05/2015  Trace bilateral pleural effusions, with mild bibasilar atelectasis. No definite evidence of pneumonia. 2. Apparent mild wall thickening along the rectum raises question for mild proctitis. Mild nonspecific presacral stranding noted. 3. Delayed excretion of contrast from the kidneys, concerning for renal insufficiency. 4. Diffuse coronary artery calcifications seen. 5. 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 6. Scattered bilateral renal cysts seen. 7. Diffuse sclerotic lesions throughout the visualized osseous structures, reflecting known metastatic prostate cancer.   Dg Chest Port 1 View 07/07/2015  Overall stable ventilation since 07/05/2015. Left greater than right pulmonary atelectasis and small pleural effusions.  Medical Consultants:  Surgery  Cardiology   Other Consultants:  PT  IAnti-Infectives:   Flagyl IV 12/25>>> Vancomycin PO 12/25>>> Vancomycin PR 12/25>>> Zosyn 12/24 - 12/25 Vancomycin IV 12/24 - 12/25  [name], MD  [facility] Pager [phone]  If 7PM-7AM, please contact night-coverage [email or web address] Password [facility] 07/09/2015, 10:42 AM   LOS: 4 days   HPI/Subjective: No events overnight.   Objective: Filed  Vitals:   07/09/15 0200 07/09/15 0400 07/09/15 0600 07/09/15 0800  BP: 142/71 164/76 133/67 148/88  Pulse: 101 109 71 110  Temp:  98.1 F (36.7 C)  97.9 F (36.6 C)  TempSrc:  Axillary  Axillary  Resp: 21 22 19 20   Height:      Weight:   97.6 kg (215 lb 2.7 oz)   SpO2: 100% 99% 100% 100%    Intake/Output Summary (Last 24 hours) at 07/09/15  1042 Last data filed at 07/09/15 1000  Gross per 24 hour  Intake    350 ml  Output   2650 ml  Net  -2300 ml    Exam:   General:  Pt is alert, follows commands appropriately, not in acute distress  Cardiovascular: Regular rate and rhythm, S1/S2, no murmurs, no rubs, no gallops  Respiratory: Clear to auscultation bilaterally, no wheezing, no crackles, no rhonchi  Abdomen: Soft, non tender, non distended, bowel sounds present, no guarding  Extremities: LE edema +2 bilateral  Data Reviewed: Basic Metabolic Panel:  Recent Labs Lab 07/05/15 0810 07/05/15 1600 07/06/15 0400 07/07/15 0500 07/07/15 1258 07/08/15 0515 07/09/15 0600  NA 142 143 144 146* 147* 147* 145  K 2.7* 4.3 4.2 3.2* 2.9* 3.0* 3.1*  CL 117* 115* 115* 117* 115* 120* 115*  CO2 16* 16* 15* 15*  --  15* 17*  GLUCOSE 113* 148* 141* 137* 139* 168* 186*  BUN 19 21* 23* 27* 23* 26* 20  CREATININE 1.45* 1.50* 1.53* 1.46* 1.20 1.09 1.06  CALCIUM 5.6* 5.8* 5.8* 5.7*  --  5.9* 5.7*  MG 1.5* 1.6* 1.6*  --   --  2.4 2.1  PHOS 2.1* 3.8 3.4  --   --  1.7* 1.3*   Liver Function Tests:  Recent Labs Lab 07/04/15 2325 07/06/15 0400 07/07/15 0500 07/08/15 0515 07/09/15 0600  AST 60* 60* 25  --   --   ALT 45 34 25  --   --   ALKPHOS 360* 486* 494*  --   --   BILITOT 0.8 0.4 0.6  --   --   PROT 5.9* 5.6* 5.6*  --   --   ALBUMIN 2.0* 2.0* 1.8* 1.8* 1.7*    Recent Labs Lab 07/04/15 2325  LIPASE 52*   No results for input(s): AMMONIA in the last 168 hours. CBC:  Recent Labs Lab 07/04/15 2325  07/05/15 1600 07/06/15 0400 07/07/15 0500 07/07/15 1258 07/08/15 0515 07/09/15 0600  WBC 5.4  < > 12.1* 13.1* 15.4*  --  13.2* 9.7  NEUTROABS 3.9  --   --  11.5*  --   --  11.1* 7.1  HGB 8.1*  < > 8.7* 8.2* 8.6* 9.5* 8.9* 8.7*  HCT 25.5*  < > 26.3* 24.2* 25.3* 28.0* 26.7* 26.5*  MCV 92.1  < > 89.5 86.7 85.8  --  86.4 86.6  PLT 252  < > 206 205 206  --  193 184  < > = values in this interval not  displayed. Cardiac Enzymes:  Recent Labs Lab 07/05/15 0955 07/05/15 1457 07/05/15 2100  TROPONINI 0.11* 0.12* 0.16*   BNP: Invalid input(s): POCBNP CBG:  Recent Labs Lab 07/07/15 1537 07/07/15 2146 07/08/15 0805 07/08/15 1207 07/08/15 1632  GLUCAP 160* 139* 151* 167* 161*    Recent Results (from the past 240 hour(s))  Blood Culture (routine x 2)     Status: None (Preliminary result)   Collection Time: 07/04/15 11:25 PM  Result Value Ref Range Status  Specimen Description BLOOD LEFT ANTECUBITAL  Final   Special Requests IN PEDIATRIC BOTTLE 3ML  Final   Culture   Final    NO GROWTH 3 DAYS Performed at [facility]    Report Status PENDING  Incomplete  Blood Culture (routine x 2)     Status: None (Preliminary result)   Collection Time: 07/04/15 11:25 PM  Result Value Ref Range Status   Specimen Description BLOOD RIGHT ANTECUBITAL  Final   Special Requests BOTTLES DRAWN AEROBIC AND ANAEROBIC 5ML  Final   Culture   Final    NO GROWTH 3 DAYS Performed at [facility]    Report Status PENDING  Incomplete  C difficile quick scan w PCR reflex     Status: Abnormal   Collection Time: 07/05/15  2:34 AM  Result Value Ref Range Status   C Diff antigen POSITIVE (A) NEGATIVE Final   C Diff toxin POSITIVE (A) NEGATIVE Final   C Diff interpretation Positive for toxigenic C. difficile  Final    Comment: CRITICAL RESULT CALLED TO, READ BACK BY AND VERIFIED WITH: MARQUEZ,A/ED @0341  ON 07/05/15 BY KARCZEWSKI,S.   MRSA PCR Screening     Status: None   Collection Time: 07/05/15  7:00 AM  Result Value Ref Range Status   MRSA by PCR NEGATIVE NEGATIVE Final    Comment:        The GeneXpert MRSA Assay (FDA approved for NASAL specimens only), is one component of a comprehensive MRSA colonization surveillance program. It is not intended to diagnose MRSA infection nor to guide or monitor treatment for MRSA infections.   Urine culture     Status: None   Collection  Time: 07/07/15  6:24 AM  Result Value Ref Range Status   Specimen Description URINE, CATHETERIZED  Final   Special Requests NONE  Final   Culture   Final    NO GROWTH 1 DAY Performed at [facility]    Report Status 07/08/2015 FINAL  Final     Scheduled Meds: . antiseptic oral rinse  7 mL Mouth Rinse q12n4p  . atorvastatin  10 mg Oral q1800  . brimonidine  1 drop Right Eye Daily   And  . timolol  1 drop Right Eye Daily  . calcium gluconate  1 g Intravenous Once  . carvedilol  12.5 mg Oral BID WC  . chlorhexidine  15 mL Mouth Rinse BID  . furosemide  40 mg Intravenous BID  . heparin subcutaneous  5,000 Units Subcutaneous 3 times per day  . insulin aspart  0-5 Units Subcutaneous QHS  . insulin aspart  0-9 Units Subcutaneous TID WC  . lactose free nutrition  237 mL Oral BID BM  . metroNIDAZOLE  500 mg Oral 3 times per day  . prednisoLONE acetate  1 drop Both Eyes BID  . saccharomyces boulardii  250 mg Oral BID  . tamsulosin  0.4 mg Oral Daily  . vancomycin  500 mg Oral 4 times per day   Continuous Infusions: . lactated ringers Stopped (07/07/15 2300)

## 2015-07-09 NOTE — Progress Notes (Signed)
CRITICAL VALUE ALERT  Critical value received:  Calcium 5.7  Date of notification:  07/09/15  Time of notification:  0827  Critical value read back:Yes.    Nurse who received alert:  Duard Larsen, RN  MD notified (1st page):  Dr. Doyle Askew  Time of first page:  0830

## 2015-07-10 ENCOUNTER — Encounter: Payer: Medicare Other | Admitting: Physician Assistant

## 2015-07-10 LAB — CBC WITH DIFFERENTIAL/PLATELET
BASOS ABS: 0.1 10*3/uL (ref 0.0–0.1)
Basophils Relative: 1 %
Eosinophils Absolute: 0.3 10*3/uL (ref 0.0–0.7)
Eosinophils Relative: 3 %
HCT: 24.8 % — ABNORMAL LOW (ref 39.0–52.0)
Hemoglobin: 8.3 g/dL — ABNORMAL LOW (ref 13.0–17.0)
LYMPHS ABS: 1 10*3/uL (ref 0.7–4.0)
Lymphocytes Relative: 11 %
MCH: 28.8 pg (ref 26.0–34.0)
MCHC: 33.5 g/dL (ref 30.0–36.0)
MCV: 86.1 fL (ref 78.0–100.0)
MONO ABS: 0.9 10*3/uL (ref 0.1–1.0)
Monocytes Relative: 9 %
NEUTROS ABS: 7.2 10*3/uL (ref 1.7–7.7)
Neutrophils Relative %: 76 %
PLATELETS: 160 10*3/uL (ref 150–400)
RBC: 2.88 MIL/uL — AB (ref 4.22–5.81)
RDW: 17.6 % — AB (ref 11.5–15.5)
WBC: 9.5 10*3/uL (ref 4.0–10.5)

## 2015-07-10 LAB — RENAL FUNCTION PANEL
ALBUMIN: 1.7 g/dL — AB (ref 3.5–5.0)
Anion gap: 13 (ref 5–15)
BUN: 15 mg/dL (ref 6–20)
CALCIUM: 6 mg/dL — AB (ref 8.9–10.3)
CO2: 18 mmol/L — ABNORMAL LOW (ref 22–32)
CREATININE: 0.95 mg/dL (ref 0.61–1.24)
Chloride: 115 mmol/L — ABNORMAL HIGH (ref 101–111)
Glucose, Bld: 192 mg/dL — ABNORMAL HIGH (ref 65–99)
PHOSPHORUS: 1.2 mg/dL — AB (ref 2.5–4.6)
Potassium: 3.2 mmol/L — ABNORMAL LOW (ref 3.5–5.1)
SODIUM: 146 mmol/L — AB (ref 135–145)

## 2015-07-10 LAB — CULTURE, BLOOD (ROUTINE X 2)
CULTURE: NO GROWTH
Culture: NO GROWTH

## 2015-07-10 LAB — GLUCOSE, CAPILLARY
GLUCOSE-CAPILLARY: 166 mg/dL — AB (ref 65–99)
GLUCOSE-CAPILLARY: 160 mg/dL — AB (ref 65–99)
GLUCOSE-CAPILLARY: 180 mg/dL — AB (ref 65–99)
Glucose-Capillary: 176 mg/dL — ABNORMAL HIGH (ref 65–99)

## 2015-07-10 LAB — MAGNESIUM: MAGNESIUM: 1.7 mg/dL (ref 1.7–2.4)

## 2015-07-10 MED ORDER — SODIUM CHLORIDE 0.9 % IJ SOLN
10.0000 mL | INTRAMUSCULAR | Status: DC | PRN
  Administered 2015-07-10: 30 mL
  Administered 2015-07-11: 10 mL
  Administered 2015-07-11: 30 mL
  Administered 2015-07-11: 20 mL
  Filled 2015-07-10 (×4): qty 40

## 2015-07-10 MED ORDER — MAGNESIUM OXIDE 400 (241.3 MG) MG PO TABS
400.0000 mg | ORAL_TABLET | Freq: Every day | ORAL | Status: DC
  Administered 2015-07-10 – 2015-07-16 (×7): 400 mg via ORAL
  Filled 2015-07-10 (×11): qty 1

## 2015-07-10 MED ORDER — POTASSIUM CHLORIDE CRYS ER 20 MEQ PO TBCR
40.0000 meq | EXTENDED_RELEASE_TABLET | Freq: Two times a day (BID) | ORAL | Status: DC
  Administered 2015-07-11 (×2): 40 meq via ORAL
  Filled 2015-07-10 (×4): qty 2

## 2015-07-10 MED ORDER — K PHOS MONO-SOD PHOS DI & MONO 155-852-130 MG PO TABS
250.0000 mg | ORAL_TABLET | Freq: Two times a day (BID) | ORAL | Status: DC
  Administered 2015-07-10 – 2015-07-11 (×2): 250 mg via ORAL
  Filled 2015-07-10 (×4): qty 1

## 2015-07-10 MED ORDER — POTASSIUM CHLORIDE CRYS ER 20 MEQ PO TBCR
30.0000 meq | EXTENDED_RELEASE_TABLET | Freq: Once | ORAL | Status: DC
  Filled 2015-07-10: qty 1

## 2015-07-10 MED ORDER — POTASSIUM CHLORIDE CRYS ER 20 MEQ PO TBCR
40.0000 meq | EXTENDED_RELEASE_TABLET | Freq: Once | ORAL | Status: DC
Start: 2015-07-10 — End: 2015-07-10
  Filled 2015-07-10: qty 2

## 2015-07-10 MED ORDER — MAGNESIUM OXIDE 400 (241.3 MG) MG PO TABS: 400.0000 mg | ORAL_TABLET | Freq: Two times a day (BID) | ORAL | Status: DC

## 2015-07-10 MED ORDER — INSULIN DETEMIR 100 UNIT/ML ~~LOC~~ SOLN
6.0000 [IU] | Freq: Every day | SUBCUTANEOUS | Status: DC
  Administered 2015-07-10 – 2015-07-16 (×7): 6 [IU] via SUBCUTANEOUS
  Filled 2015-07-10 (×8): qty 0.06

## 2015-07-10 MED ORDER — POTASSIUM CHLORIDE CRYS ER 20 MEQ PO TBCR
40.0000 meq | EXTENDED_RELEASE_TABLET | Freq: Three times a day (TID) | ORAL | Status: DC
  Administered 2015-07-10: 40 meq via ORAL

## 2015-07-10 NOTE — Progress Notes (Signed)
Patient Name: Jeremy Jennings Date of Encounter: 07/10/2015  Principal Problem:   Septic shock ([place]) Active Problems:   Sepsis ([place])   C. difficile colitis   Acute renal failure ([place])   Elevated troponin I level   Acute diastolic (congestive) heart failure [facility])   Primary Cardiologist: Dr. Mare Ferrari  Patient Profile: 79 y.o. male with PMH of HTN, HLD, Type 2 DM, and Stage 4 Prostate Cancer recently admitted from 06/11/2015 to 07/03/2015 for a small bowel obstruction. Represented on 07/04/2015 with Septic Shock secondary to severe C.diff Colitis. Cardiology consulted for elevated troponin values.  SUBJECTIVE: Denies chest pain, feels he is improving, breathing better, but has not been OOB  OBJECTIVE Filed Vitals:   07/09/15 2000 07/09/15 2057 07/10/15 0500 07/10/15 0554  BP: 158/71 129/71  134/76  Pulse: 108 105  106  Temp: 99 F (37.2 C) 98.1 F (36.7 C)  97.4 F (36.3 C)  TempSrc: Oral Oral  Oral  Resp: 23 20  20   Height:      Weight:   216 lb 1.6 oz (98.022 kg)   SpO2: 100% 100%  100%    Intake/Output Summary (Last 24 hours) at 07/10/15 0755 Last data filed at 07/10/15 0636  Gross per 24 hour  Intake    350 ml  Output   1600 ml  Net  -1250 ml   Filed Weights   07/08/15 0500 07/09/15 0600 07/10/15 0500  Weight: 219 lb 9.3 oz (99.6 kg) 215 lb 2.7 oz (97.6 kg) 216 lb 1.6 oz (98.022 kg)    PHYSICAL EXAM General: Well developed, well nourished, male in no acute distress. Head: Normocephalic, atraumatic.  Neck: Supple without bruits, JVD 10 cm. Lungs:  Resp regular and unlabored, rales bases. Heart: RRR, S1, S2, no S3, S4, or murmur; no rub. Abdomen: Soft, non-tender, non-distended, BS + x 4.  Extremities: No clubbing, cyanosis, 2+ edema.  Neuro: Alert and oriented X 3. Moves all extremities spontaneously. Psych: Normal affect.  LABS: CBC: Recent Labs  07/09/15 0600 07/10/15 0500  WBC 9.7 9.5  NEUTROABS 7.1 7.2  HGB 8.7* 8.3*  HCT 26.5* 24.8*    MCV 86.6 86.1  PLT 184 0000000   Basic Metabolic Panel: Recent Labs  07/09/15 0600 07/10/15 0500  NA 145 146*  K 3.1* 3.2*  CL 115* 115*  CO2 17* 18*  GLUCOSE 186* 192*  BUN 20 15  CREATININE 1.06 0.95  CALCIUM 5.7* 6.0*  MG 2.1 1.7  PHOS 1.3* 1.2*   Liver Function Tests: Recent Labs  07/09/15 0600 07/10/15 0500  ALBUMIN 1.7* 1.7*   BNP:  B NATRIURETIC PEPTIDE  Date/Time Value Ref Range Status  07/05/2015 09:55 AM 348.0* 0.0 - 100.0 pg/mL Final  06/23/2015 05:30 AM 147.8* 0.0 - 100.0 pg/mL Final   TELE: SR, PVCs are frequent        Current Medications:  . antiseptic oral rinse  7 mL Mouth Rinse q12n4p  . atorvastatin  10 mg Oral q1800  . brimonidine  1 drop Right Eye Daily   And  . timolol  1 drop Right Eye Daily  . carvedilol  12.5 mg Oral BID WC  . chlorhexidine  15 mL Mouth Rinse BID  . heparin subcutaneous  5,000 Units Subcutaneous 3 times per day  . insulin aspart  0-5 Units Subcutaneous QHS  . insulin aspart  0-9 Units Subcutaneous TID WC  . lactose free nutrition  237 mL Oral BID BM  . metroNIDAZOLE  500  mg Oral 3 times per day  . potassium chloride  30 mEq Oral Once  . prednisoLONE acetate  1 drop Both Eyes BID  . saccharomyces boulardii  250 mg Oral BID  . tamsulosin  0.4 mg Oral Daily  . vancomycin  500 mg Oral 4 times per day   . lactated ringers Stopped (07/07/15 2300)    ASSESSMENT AND PLAN: 1. Elevated Troponin - troponin peak 0.16 in the setting of septic shock secondary to C.diff colitis. - felt demand ischemia in the setting of his acute illness. - denies any anginal symptoms. EKG shows no acute ischemic changes. - no invasive workup at this time.  2. Acute on Chronic Diastolic CHF - EF [id number] on recent echo with no wall motion abnormalities. - Is +2.6L this admission due to need for hydration in setting of septic shock. - Continue with daily weights and strict I&O's. - Started on IV Lasix 40mg  BID on 07/08/2015 with net output of  almost 2 L. Weight was 198 lbs on admission>>214>>216>>219>>215>>216 on 12/30 - weight 198 on 12/24 - Would continue current Lasix dose for now.    3. HTN - BP has been 119/71 - 164/76 in the past 24 hours - will restart home medications as BP allows.   4. Sinus tachycardia with Frequent PAC's, PVCs - His Diltiazem 360mg  daily was stopped during his last admission and replaced with Carvedilol 12.5mg  BID. -  However, both were listed on his discharge summary as "TO TAKE".  - continue with Carvedilol for now and increase as BP allows. Appears his HR was better controlled on Carvedilol than Cardizem, so could hopefully only do Carvedilol for now.  - currently on PTA dose of 12.5mg  BID. - will increase to 18.75 mg bid, goal is 25 mg bid at d/c  5. Septic Shock secondary to Severe C.diff Colitis - currently off pressors - on Abx treatment - per admitting team  6. Hypokalemia - was 3.0 on 07/08/2015 and replaced. - 3.1 12/29>>3.2 12/30 after 80 meq - give 40 meq tid while on IV Lasix - give additional 40 meq today, needs K+ closer to 4.0  7. Hypomagnesemia - improved but still w/ frequent vent ectopy - supplement and follow  Principal Problem:   Septic shock ([place]) Active Problems:   Sepsis ([place])   C. difficile colitis   Acute renal failure ([place])   Elevated troponin I level   Acute diastolic (congestive) heart failure ([place])   Signed, Rosaria Ferries , PA-C 7:55 AM 07/10/2015

## 2015-07-10 NOTE — Progress Notes (Addendum)
Patient ID: Jeremy Jennings, male   DOB: 02-19-1936, 79 y.o.   MRN: [id number]  [facility] PROGRESS NOTE  Jeremy Jennings [id number] DOB: 1935/08/03 DOA: 07/04/2015 PCP: [name], MD   Brief narrative:    79 y.o. Male who presented from NH with complaints of fever and diarrhea. He was recently hospitalized for SBO requiring ex-lap complicated by brief cardiac arrest as well as UTI on vanc/Zosyn. He was discharged 12/24.   [facility] asked to assume care from PCCM starting 07/08/2015.   MICROBIOLOGY: Urine Ctx 12/27>>> Blood Ctx x2 12/24>>> Stool C diff 12/25: Ag & Toxin Positive  Assessment/Plan:    Principal Problem:   Septic shock (HCC) - secondary to Severe C diff colitis - Continue with IV Flagyl to PO Flagyl q8hr Day #6/14 - Vancomycin PO Day #6/14 - still with significant diarrhea and has rectal tube     Acute hypoxic respiratory failure  - secondary to atelectasis and acute on chronic diastolic CHF  - lasix as noted below  - keep on oxygen via [place] to maintain oxygen saturation > 92 %  Active Problems:   Acute encephalopathy - secondary to the above  - resolved     Acute and chronic diastolic (congestive) heart failure (HCC) - EF 65-70% on recent echo with no wall motion abnormalities. - Continue with daily weights and strict I&O's. - weight was 198 lbs on admission>>214>>216>>219>>215>>216 on 12/30 - Would continue current Lasix dose for now    Elevated Troponin I - troponin peak 0.16 in the setting of septic shock secondary to C.diff colitis. - felt demand ischemia in the setting of his acute illness. - EKG with no acute ischemic changes. - no invasive workup at this time per cardiology recommendations     Sinus tachycardia with Frequent PAC's, PVCs - Diltiazem 360mg  daily was stopped during his last admission and replaced with Carvedilol 12.5mg  BID. - continue with Carvedilol for now and increase as BP allows    Acute Renal Failure with  metabolic acidosis  - pre renal and secondary to sepsis - resolved     Hypokalemia, hypomagnesemia hypophosphatemia, and hypocalcemia  - continue to supplement and repeat BMP, Mg in AM    Hypernatremia - monitor on Lasix  - BMP in AM    Anemia of chronic disease - no signs of bleeding - CBC in AM    DM2  - Changing Accu-Checks qAC & HS - add Levemir 6 U QHS - Low dose SSI - appreciate diabetic educator assistance   DVT prophylaxis - Heparin SQ  Code Status: Full.  Family Communication:  plan of care discussed with the patient Disposition Plan: Home when cleared by cardiology team and when off IV lasix, also rectal tube must be out   IV lines and tubes:  Left IJ CVL 12/25>>> Foley Catheter 12/26>>> Rectal Tube 12/26>>>  Procedures and diagnostic studies:    Dg Chest 1 View 07/02/2015 Hypoinflation with bibasilar opacification likely atelectasis although cannot exclude infection.   Dg Chest 2 View 07/05/2015 Small pleural effusions and bibasilar atelectasis better seen on today's dedicated CT chest.   Ct Chest W Contrast 07/05/2015  Trace bilateral pleural effusions, with mild bibasilar atelectasis. No definite evidence of pneumonia. 2. Apparent mild wall thickening along the rectum raises question for mild proctitis. Mild nonspecific presacral stranding noted. 3. Delayed excretion of contrast from the kidneys, concerning for renal insufficiency. 4. Diffuse coronary artery calcifications seen. 5. Incidental note of a retroesophageal course of the right  subclavian artery. 6. Scattered bilateral renal cysts seen. 7. Diffuse sclerotic lesions throughout the visualized osseous structures, reflecting known metastatic prostate cancer.   Dg Chest Port 1 View 07/07/2015  Overall stable ventilation since 07/05/2015. Left greater than right pulmonary atelectasis and small pleural effusions.  Medical Consultants:  Surgery  Cardiology   Other Consultants:  PT  IAnti-Infectives:    Flagyl IV 12/25>>> Vancomycin PO 12/25>>> Vancomycin PR 12/25>>> Zosyn 12/24 - 12/25 Vancomycin IV 12/24 - 12/25  [name], MD  [facility] Pager [phone]  If 7PM-7AM, please contact night-coverage [email or web address] Password [email or web address] 07/10/2015, 4:42 PM   LOS: 5 days   HPI/Subjective: No events overnight. Still with exertional dyspnea but overall better.   Objective: Filed Vitals:   07/09/15 2057 07/10/15 0500 07/10/15 0554 07/10/15 1606  BP: 129/71  134/76 139/67  Pulse: 105  106 105  Temp: 98.1 F (36.7 C)  97.4 F (36.3 C) 97.7 F (36.5 C)  TempSrc: Oral  Oral Oral  Resp: 20  20 20   Height:      Weight:  98.022 kg (216 lb 1.6 oz)    SpO2: 100%  100% 100%    Intake/Output Summary (Last 24 hours) at 07/10/15 1642 Last data filed at 07/10/15 1014  Gross per 24 hour  Intake    480 ml  Output    800 ml  Net   -320 ml    Exam:   General:  Pt is alert, follows commands appropriately, not in acute distress  Cardiovascular: Regular rate and rhythm, S1/S2, no murmurs, no rubs, no gallops  Respiratory: Clear to auscultation bilaterally, no wheezing,bibasilar crackles   Abdomen: Soft, non tender, non distended, bowel sounds present, no guarding  Extremities: LE edema +2 bilateral  Data Reviewed: Basic Metabolic Panel:  Recent Labs Lab 07/05/15 1600 07/06/15 0400 07/07/15 0500 07/07/15 1258 07/08/15 0515 07/09/15 0600 07/10/15 0500  NA 143 144 146* 147* 147* 145 146*  K 4.3 4.2 3.2* 2.9* 3.0* 3.1* 3.2*  CL 115* 115* 117* 115* 120* 115* 115*  CO2 16* 15* 15*  --  15* 17* 18*  GLUCOSE 148* 141* 137* 139* 168* 186* 192*  BUN 21* 23* 27* 23* 26* 20 15  CREATININE 1.50* 1.53* 1.46* 1.20 1.09 1.06 0.95  CALCIUM 5.8* 5.8* 5.7*  --  5.9* 5.7* 6.0*  MG 1.6* 1.6*  --   --  2.4 2.1 1.7  PHOS 3.8 3.4  --   --  1.7* 1.3* 1.2*   Liver Function Tests:  Recent Labs Lab 07/04/15 2325 07/06/15 0400 07/07/15 0500 07/08/15 0515 07/09/15 0600 07/10/15 0500  AST  60* 60* 25  --   --   --   ALT 45 34 25  --   --   --   ALKPHOS 360* 486* 494*  --   --   --   BILITOT 0.8 0.4 0.6  --   --   --   PROT 5.9* 5.6* 5.6*  --   --   --   ALBUMIN 2.0* 2.0* 1.8* 1.8* 1.7* 1.7*    Recent Labs Lab 07/04/15 2325  LIPASE 52*   CBC:  Recent Labs Lab 07/04/15 2325  07/06/15 0400 07/07/15 0500 07/07/15 1258 07/08/15 0515 07/09/15 0600 07/10/15 0500  WBC 5.4  < > 13.1* 15.4*  --  13.2* 9.7 9.5  NEUTROABS 3.9  --  11.5*  --   --  11.1* 7.1 7.2  HGB 8.1*  < > 8.2* 8.6* 9.5* 8.9* 8.7* 8.3*  HCT 25.5*  < > 24.2* 25.3* 28.0* 26.7* 26.5* 24.8*  MCV 92.1  < > 86.7 85.8  --  86.4 86.6 86.1  PLT 252  < > 205 206  --  193 184 160  < > = values in this interval not displayed. Cardiac Enzymes:  Recent Labs Lab 07/05/15 0955 07/05/15 1457 07/05/15 2100  TROPONINI 0.11* 0.12* 0.16*   CBG:  Recent Labs Lab 07/09/15 1237 07/09/15 1622 07/09/15 2045 07/10/15 0748 07/10/15 1220  GLUCAP 249* 182* 165* 176* 166*    Recent Results (from the past 240 hour(s))  Blood Culture (routine x 2)     Status: None   Collection Time: 07/04/15 11:25 PM  Result Value Ref Range Status   Specimen Description BLOOD LEFT ANTECUBITAL  Final   Special Requests IN PEDIATRIC BOTTLE 3ML  Final   Culture   Final    NO GROWTH 5 DAYS Performed at [facility]    Report Status 07/10/2015 FINAL  Final  Blood Culture (routine x 2)     Status: None   Collection Time: 07/04/15 11:25 PM  Result Value Ref Range Status   Specimen Description BLOOD RIGHT ANTECUBITAL  Final   Special Requests BOTTLES DRAWN AEROBIC AND ANAEROBIC 5ML  Final   Culture   Final    NO GROWTH 5 DAYS Performed at [facility]    Report Status 07/10/2015 FINAL  Final  C difficile quick scan w PCR reflex     Status: Abnormal   Collection Time: 07/05/15  2:34 AM  Result Value Ref Range Status   C Diff antigen POSITIVE (A) NEGATIVE Final   C Diff toxin POSITIVE (A) NEGATIVE Final   C Diff  interpretation Positive for toxigenic C. difficile  Final    Comment: CRITICAL RESULT CALLED TO, READ BACK BY AND VERIFIED WITH: MARQUEZ,A/ED @0341  ON 07/05/15 BY KARCZEWSKI,S.   MRSA PCR Screening     Status: None   Collection Time: 07/05/15  7:00 AM  Result Value Ref Range Status   MRSA by PCR NEGATIVE NEGATIVE Final  Urine culture     Status: None   Collection Time: 07/07/15  6:24 AM  Result Value Ref Range Status   Specimen Description URINE, CATHETERIZED  Final   Special Requests NONE  Final   Culture   Final    NO GROWTH 1 DAY Performed at [facility]    Report Status 07/08/2015 FINAL  Final     Scheduled Meds: . antiseptic oral rinse  7 mL Mouth Rinse q12n4p  . atorvastatin  10 mg Oral q1800  . brimonidine  1 drop Right Eye Daily   And  . timolol  1 drop Right Eye Daily  . carvedilol  12.5 mg Oral BID WC  . chlorhexidine  15 mL Mouth Rinse BID  . heparin subcutaneous  5,000 Units Subcutaneous 3 times per day  . insulin aspart  0-5 Units Subcutaneous QHS  . insulin aspart  0-9 Units Subcutaneous TID WC  . lactose free nutrition  237 mL Oral BID BM  . magnesium oxide  400 mg Oral Daily  . metroNIDAZOLE  500 mg Oral 3 times per day  . potassium chloride  40 mEq Oral TID  . potassium chloride  40 mEq Oral Once  . prednisoLONE acetate  1 drop Both Eyes BID  . saccharomyces boulardii  250 mg Oral BID  . tamsulosin  0.4 mg Oral Daily  . vancomycin  500 mg Oral 4 times  per day   Continuous Infusions: . lactated ringers Stopped (07/07/15 2300)

## 2015-07-10 NOTE — Progress Notes (Addendum)
CRITICAL VALUE ALERT  Critical value received:  Calcium 6.0  Date of notification:  07/10/15  Time of notification:  0625  Critical value read back:Yes.    Nurse who received alert:  Hagar Sadiq  MD notified (1st page):  K.Kirby  Time of first page:  0625  Time responded: [phone]

## 2015-07-10 NOTE — Progress Notes (Signed)
CSW spoke with Pt's daughter Valaria Good and she would like CSW to email ( [email or web address] )Pt's bed offers with addresses and she will be making visits today and getting back to CSW for a facility choice.   CSW to follow for d/c planning.     Pete Pelt [facility]  [phone]

## 2015-07-10 NOTE — Telephone Encounter (Signed)
Pt currently still admitted to the hospital for septic shock.

## 2015-07-10 NOTE — Progress Notes (Signed)
Inpatient Diabetes Program Recommendations  AACE/ADA: New Consensus Statement on Inpatient Glycemic Control (2015)  Target Ranges:  Prepandial:   less than 140 mg/dL      Peak postprandial:   less than 180 mg/dL (1-2 hours)      Critically ill patients:  140 - 180 mg/dL    Results for CHAO, WILTGEN (MRN [id number]) as of 07/10/2015 11:19  Ref. Range 07/09/2015 08:38 07/09/2015 12:37 07/09/2015 16:22 07/09/2015 20:45  Glucose-Capillary Latest Ref Range: 65-99 mg/dL 170 (H) 249 (H) 182 (H) 165 (H)    Results for EKENE, BARRINGTON (MRN [id number]) as of 07/10/2015 11:19  Ref. Range 07/10/2015 07:48  Glucose-Capillary Latest Ref Range: 65-99 mg/dL 176 (H)    Admit with: Sepsis/ C diff  History: DM, HTN  Home DM Meds: Levemir 12 units QHS       Novolog 12 units tidwc       Victoza 1.8 mg daily       Actos 30 mg daily  Current Insulin Orders: Novolog Sensitive SSI (0-9 units) TID AC + HS      MD- Please consider starting Levemir 6 units QHS (50% of home dose)     --Will follow patient during hospitalization--  Wyn Quaker RN, MSN, CDE Diabetes Coordinator Inpatient Glycemic Control Team Team Pager: [phone] (8a-5p)

## 2015-07-10 NOTE — Progress Notes (Signed)
CSW provided list of bed offers for SNF facilities.   Pt would like to go over offers with his daughter today.   CSW requested Pt and daughter make a bed decision today so that a bed will be available at d/c.   Pt agreed to request.   CSW to check back for facility choice.       Pete Pelt [facility]  [phone]

## 2015-07-10 NOTE — Care Management Important Message (Signed)
Important Message  Patient Details IM Letter given to Kathy/Case Manager to present to [place] Message  Patient Details  Name: Jeremy Jennings MRN: [id number] Date of Birth: May 31, 1936   Medicare Important Message Given:  Yes    Camillo Flaming 07/10/2015, 11:47 AM Name: Jeremy Jennings MRN: [id number] Date of Birth: [birth year]   Medicare Important Message Given:  Yes    Camillo Flaming 07/10/2015, 11:47 AM

## 2015-07-11 DIAGNOSIS — I5031 Acute diastolic (congestive) heart failure: Secondary | ICD-10-CM

## 2015-07-11 LAB — GLUCOSE, CAPILLARY
GLUCOSE-CAPILLARY: 148 mg/dL — AB (ref 65–99)
GLUCOSE-CAPILLARY: 229 mg/dL — AB (ref 65–99)
Glucose-Capillary: 183 mg/dL — ABNORMAL HIGH (ref 65–99)
Glucose-Capillary: 206 mg/dL — ABNORMAL HIGH (ref 65–99)

## 2015-07-11 LAB — CBC WITH DIFFERENTIAL/PLATELET
BASOS ABS: 0.1 10*3/uL (ref 0.0–0.1)
Basophils Relative: 1 %
EOS ABS: 0.2 10*3/uL (ref 0.0–0.7)
Eosinophils Relative: 2 %
HCT: 23.7 % — ABNORMAL LOW (ref 39.0–52.0)
Hemoglobin: 7.9 g/dL — ABNORMAL LOW (ref 13.0–17.0)
LYMPHS ABS: 1.1 10*3/uL (ref 0.7–4.0)
Lymphocytes Relative: 10 %
MCH: 28.7 pg (ref 26.0–34.0)
MCHC: 33.3 g/dL (ref 30.0–36.0)
MCV: 86.2 fL (ref 78.0–100.0)
MONO ABS: 0.9 10*3/uL (ref 0.1–1.0)
Monocytes Relative: 8 %
Neutro Abs: 8.4 10*3/uL — ABNORMAL HIGH (ref 1.7–7.7)
Neutrophils Relative %: 79 %
PLATELETS: 148 10*3/uL — AB (ref 150–400)
RBC: 2.75 MIL/uL — AB (ref 4.22–5.81)
RDW: 17.8 % — AB (ref 11.5–15.5)
WBC: 10.7 10*3/uL — AB (ref 4.0–10.5)

## 2015-07-11 LAB — RENAL FUNCTION PANEL
Albumin: 1.6 g/dL — ABNORMAL LOW (ref 3.5–5.0)
Anion gap: 9 (ref 5–15)
BUN: 11 mg/dL (ref 6–20)
CHLORIDE: 116 mmol/L — AB (ref 101–111)
CO2: 20 mmol/L — AB (ref 22–32)
CREATININE: 0.85 mg/dL (ref 0.61–1.24)
Calcium: 5.8 mg/dL — CL (ref 8.9–10.3)
GFR calc Af Amer: >60 mL/min (ref >60)
Glucose, Bld: 174 mg/dL — ABNORMAL HIGH (ref 65–99)
POTASSIUM: 3.1 mmol/L — AB (ref 3.5–5.1)
Phosphorus: 1.2 mg/dL — ABNORMAL LOW (ref 2.5–4.6)
Sodium: 145 mmol/L (ref 135–145)

## 2015-07-11 LAB — MAGNESIUM: MAGNESIUM: 1.5 mg/dL — AB (ref 1.7–2.4)

## 2015-07-11 MED ORDER — SODIUM CHLORIDE 0.9 % IV SOLN
1.0000 g | Freq: Once | INTRAVENOUS | Status: AC
  Administered 2015-07-11: 1 g via INTRAVENOUS
  Filled 2015-07-11: qty 10

## 2015-07-11 MED ORDER — MAGNESIUM SULFATE 4 GM/100ML IV SOLN
4.0000 g | Freq: Once | INTRAVENOUS | Status: AC
  Administered 2015-07-11: 4 g via INTRAVENOUS
  Filled 2015-07-11: qty 100

## 2015-07-11 MED ORDER — POTASSIUM & SODIUM PHOSPHATES 280-160-250 MG PO PACK
1.0000 | PACK | Freq: Three times a day (TID) | ORAL | Status: DC
Start: 2015-07-11 — End: 2015-07-11

## 2015-07-11 MED ORDER — FUROSEMIDE 10 MG/ML IJ SOLN
40.0000 mg | Freq: Two times a day (BID) | INTRAMUSCULAR | Status: DC
  Administered 2015-07-11 – 2015-07-15 (×9): 40 mg via INTRAVENOUS
  Filled 2015-07-11 (×14): qty 4

## 2015-07-11 MED ORDER — K PHOS MONO-SOD PHOS DI & MONO 155-852-130 MG PO TABS
500.0000 mg | ORAL_TABLET | Freq: Two times a day (BID) | ORAL | Status: DC
  Administered 2015-07-11 – 2015-07-17 (×12): 500 mg via ORAL
  Filled 2015-07-11 (×13): qty 2

## 2015-07-11 NOTE — Progress Notes (Addendum)
Pharmacy Antibiotic Follow-up Note  Jeremy Jennings is a 79 y.o. year-old male admitted on 07/04/2015.  The patient is currently on day 7 of 14 PO vancomycin and PO metronidazole for CDiff.  Assessment/Plan: 79 yo from NH presented to ER with fever and diarrhea. Note patient was just discharged for SBO requiring ex lap complicated by brief cardiac arrest and UTI treated with Vanc/Zosyn. Patient in septic shock due to severe Cdiff, treat with PO vanc and IV Flagyl.  Stool output appears to be improving per documentation, rectal tube remains in place   Currently on Vancomycin 500mg  po qid, Metronidazole 500mg  po q8h  Pharmacy to follow at a distance as no further interventions/adjustments needed at this time  Temp (24hrs), Avg:98.2 F (36.8 C), Min:97.7 F (36.5 C), Max:99.3 F (37.4 C)   Recent Labs Lab 07/07/15 0500 07/08/15 0515 07/09/15 0600 07/10/15 0500 07/11/15 0630  WBC 15.4* 13.2* 9.7 9.5 10.7*     Recent Labs Lab 07/07/15 1258 07/08/15 0515 07/09/15 0600 07/10/15 0500 07/11/15 0630  CREATININE 1.20 1.09 1.06 0.95 0.85   Estimated Creatinine Clearance: 88.3 mL/min (by C-G formula based on Cr of 0.85).    No Known Allergies  Antimicrobials this admission: 12/25 >> IV>PO Flagyl >> 12/25 >> PO Vanc >> 12/25 >> PR Vanc >> 12/27  Microbiology results: 12/24 Blood: ngtd  12/27 Urine: ngtd  12/25 Cdiff: positive tox and Ag  MRSA neg  Thank you for allowing pharmacy to be a part of this patient's care.  Doreene Eland, PharmD, BCPS.   Pager: [phone] 07/11/2015 10:08 AM

## 2015-07-11 NOTE — Progress Notes (Addendum)
[facility] PROGRESS NOTE  Jeremy Jennings [id number] DOB: [birth year] DOA: 07/04/2015 PCP: [name], MD  Brief narrative 79 year old nursing home resident male with history of chronic diastolic CHF and type 2 diabetes mellitus presenting with fever and diarrhea. He was recently hospitalized for small bowel obstruction requiring exploratory laparotomy with small bowel resection which was complicated by a brief episode of cardiac arrest. He also had UTI and was treated with antibiotics and discharged on 12/23. Patient on presentation had septic shock secondary to C. difficile colitis and was admitted to ICU. Transferred care to hospitalist service on 12/28.  Assessment/Plan: Principal problem Septic shock secondary to severe C. difficile colitis Oral vancomycin and Flagyl (day 7/14). Still having diarrhea (reported to be improving) and no further leukocytosis.  Acute hypoxic respiratory failure Secondary to acute on chronic diastolic CHF. Patient was initially hypovolemic requiring IV fluids. No volume overloaded. On scheduled Lasix. Continue O2 via nasal cannula.  Acute on chronic diastolic CHF On scheduled IV Lasix. Almost 20 pound weight gain since admission. Monitor strict I/O and daily weight. Cardiology following. replenish low k. Continue Coreg. Recent 2-D echo with normal EF and no wall motion abnormality.   hypokalemia/hypocalcemia/hypomagnesemia/hypophosphatemia Replenished. Stable on telemetry. Increase phosphate dose.  Recent small bowel obstruction. Status post exploratory laparotomy and small bowel resection. Follow-up with surgery as outpatient.  Anemia of chronic disease Hemoglobin low but stable  Type 2 diabetes mellitus Continue sliding scale coverage. Added pre-meal aspart  Code Status: Full code Family Communication: None at bedside Disposition Plan: Skilled nursing facility possibly early next week once adequately  diuresed   Consultants:  Cardiology  Procedures:  CT chest with contrast  Antibiotics:  None  HPI/Subjective: Seen and examined. Reports his breathing to be better.  Objective: Filed Vitals:   07/10/15 2157 07/11/15 0500  BP: 138/59 135/61  Pulse: 105 100  Temp: 99.3 F (37.4 C) 97.7 F (36.5 C)  Resp: 20 20    Intake/Output Summary (Last 24 hours) at 07/11/15 1444 Last data filed at 07/11/15 1216  Gross per 24 hour  Intake      0 ml  Output   2350 ml  Net  -2350 ml   Filed Weights   07/08/15 0500 07/09/15 0600 07/10/15 0500  Weight: 99.6 kg (219 lb 9.3 oz) 97.6 kg (215 lb 2.7 oz) 98.022 kg (216 lb 1.6 oz)    Exam:   General:  Elderly male not in distress  HEENT: No pallor, moist mucosa, no JVD, left IJ  Chest: Diminished bibasilar breath sounds  CVS: Normal S1 and S2, no murmurs rub or gallop  GI: Soft, nondistended, nontender, bowel sounds present, swollen penis and scrotum with Foley catheter, rectal tube in place  Musculoskeletal: 2+ pitting edema bilaterally  CNS: Alert and oriented  Data Reviewed: Basic Metabolic Panel:  Recent Labs Lab 07/06/15 0400 07/07/15 0500 07/07/15 1258 07/08/15 0515 07/09/15 0600 07/10/15 0500 07/11/15 0630  NA 144 146* 147* 147* 145 146* 145  K 4.2 3.2* 2.9* 3.0* 3.1* 3.2* 3.1*  CL 115* 117* 115* 120* 115* 115* 116*  CO2 15* 15*  --  15* 17* 18* 20*  GLUCOSE 141* 137* 139* 168* 186* 192* 174*  BUN 23* 27* 23* 26* 20 15 11   CREATININE 1.53* 1.46* 1.20 1.09 1.06 0.95 0.85  CALCIUM 5.8* 5.7*  --  5.9* 5.7* 6.0* 5.8*  MG 1.6*  --   --  2.4 2.1 1.7 1.5*  PHOS 3.4  --   --  1.7*  1.3* 1.2* 1.2*   Liver Function Tests:  Recent Labs Lab 07/04/15 2325 07/06/15 0400 07/07/15 0500 07/08/15 0515 07/09/15 0600 07/10/15 0500 07/11/15 0630  AST 60* 60* 25  --   --   --   --   ALT 45 34 25  --   --   --   --   ALKPHOS 360* 486* 494*  --   --   --   --   BILITOT 0.8 0.4 0.6  --   --   --   --   PROT 5.9*  5.6* 5.6*  --   --   --   --   ALBUMIN 2.0* 2.0* 1.8* 1.8* 1.7* 1.7* 1.6*    Recent Labs Lab 07/04/15 2325  LIPASE 52*   No results for input(s): AMMONIA in the last 168 hours. CBC:  Recent Labs Lab 07/06/15 0400 07/07/15 0500 07/07/15 1258 07/08/15 0515 07/09/15 0600 07/10/15 0500 07/11/15 0630  WBC 13.1* 15.4*  --  13.2* 9.7 9.5 10.7*  NEUTROABS 11.5*  --   --  11.1* 7.1 7.2 8.4*  HGB 8.2* 8.6* 9.5* 8.9* 8.7* 8.3* 7.9*  HCT 24.2* 25.3* 28.0* 26.7* 26.5* 24.8* 23.7*  MCV 86.7 85.8  --  86.4 86.6 86.1 86.2  PLT 205 206  --  193 184 160 148*   Cardiac Enzymes:  Recent Labs Lab 07/05/15 0955 07/05/15 1457 07/05/15 2100  TROPONINI 0.11* 0.12* 0.16*   BNP (last 3 results)  Recent Labs  06/19/15 1120 06/23/15 0530 07/05/15 0955  BNP 258.8* 147.8* 348.0*    ProBNP (last 3 results) No results for input(s): PROBNP in the last 8760 hours.  CBG:  Recent Labs Lab 07/10/15 1220 07/10/15 1655 07/10/15 2154 07/11/15 0801 07/11/15 1136  GLUCAP 166* 160* 180* 148* 183*    Recent Results (from the past 240 hour(s))  Blood Culture (routine x 2)     Status: None   Collection Time: 07/04/15 11:25 PM  Result Value Ref Range Status   Specimen Description BLOOD LEFT ANTECUBITAL  Final   Special Requests IN PEDIATRIC BOTTLE 3ML  Final   Culture   Final    NO GROWTH 5 DAYS Performed at [facility]    Report Status 07/10/2015 FINAL  Final  Blood Culture (routine x 2)     Status: None   Collection Time: 07/04/15 11:25 PM  Result Value Ref Range Status   Specimen Description BLOOD RIGHT ANTECUBITAL  Final   Special Requests BOTTLES DRAWN AEROBIC AND ANAEROBIC 5ML  Final   Culture   Final    NO GROWTH 5 DAYS Performed at [facility]    Report Status 07/10/2015 FINAL  Final  C difficile quick scan w PCR reflex     Status: Abnormal   Collection Time: 07/05/15  2:34 AM  Result Value Ref Range Status   C Diff antigen POSITIVE (A) NEGATIVE Final    C Diff toxin POSITIVE (A) NEGATIVE Final   C Diff interpretation Positive for toxigenic C. difficile  Final    Comment: CRITICAL RESULT CALLED TO, READ BACK BY AND VERIFIED WITH: MARQUEZ,A/ED @0341  ON 07/05/15 BY KARCZEWSKI,S.   MRSA PCR Screening     Status: None   Collection Time: 07/05/15  7:00 AM  Result Value Ref Range Status   MRSA by PCR NEGATIVE NEGATIVE Final    Comment:        The GeneXpert MRSA Assay (FDA approved for NASAL specimens only), is one component of a comprehensive  MRSA colonization surveillance program. It is not intended to diagnose MRSA infection nor to guide or monitor treatment for MRSA infections.   Urine culture     Status: None   Collection Time: 07/07/15  6:24 AM  Result Value Ref Range Status   Specimen Description URINE, CATHETERIZED  Final   Special Requests NONE  Final   Culture   Final    NO GROWTH 1 DAY Performed at [facility]    Report Status 07/08/2015 FINAL  Final     Studies: No results found.  Scheduled Meds: . antiseptic oral rinse  7 mL Mouth Rinse q12n4p  . atorvastatin  10 mg Oral q1800  . brimonidine  1 drop Right Eye Daily   And  . timolol  1 drop Right Eye Daily  . carvedilol  12.5 mg Oral BID WC  . chlorhexidine  15 mL Mouth Rinse BID  . furosemide  40 mg Intravenous BID  . heparin subcutaneous  5,000 Units Subcutaneous 3 times per day  . insulin aspart  0-5 Units Subcutaneous QHS  . insulin aspart  0-9 Units Subcutaneous TID WC  . insulin detemir  6 Units Subcutaneous QHS  . lactose free nutrition  237 mL Oral BID BM  . magnesium oxide  400 mg Oral Daily  . metroNIDAZOLE  500 mg Oral 3 times per day  . phosphorus  250 mg Oral BID  . potassium chloride  40 mEq Oral BID  . prednisoLONE acetate  1 drop Both Eyes BID  . saccharomyces boulardii  250 mg Oral BID  . tamsulosin  0.4 mg Oral Daily  . vancomycin  500 mg Oral 4 times per day   Continuous Infusions: . lactated ringers Stopped (07/07/15 2300)      Time spent: 25 minutes    Yossef Gilkison  [facility] Pager [phone] If 7PM-7AM, please contact night-coverage at [email or web address], password [facility] 07/11/2015, 2:44 PM  LOS: 6 days

## 2015-07-11 NOTE — Progress Notes (Addendum)
CSW called pt daughter who stated that she received the facility list from colleague. Per daughter, pt and she are interested [facility], [facility], and [facility]. She plans to visit those today to make her decision. CSW informed pt daughter per MD, patient may be ready tomorrow or Monday per discussion with MD. Pt daughter to f/u with CSW colleague tomorrow.   Belia Heman, [place] Covering CSW

## 2015-07-11 NOTE — Progress Notes (Signed)
Primary cardiologist: Dr. Darlin Coco  Seen for followup: Diastolic heart failure, elevated troponin I  Subjective:    Patient reports pain this morning. Still has loose stools with rectal tube in place. Has been eating to limited degree. No abdominal pain. Chest pain or palpitations. Still has significant leg edema.  Objective:   Temp:  [97.7 F (36.5 C)-99.3 F (37.4 C)] 97.7 F (36.5 C) (12/31 0500) Pulse Rate:  [100-105] 100 (12/31 0500) Resp:  [20] 20 (12/31 0500) BP: (135-139)/(59-67) 135/61 mmHg (12/31 0500) SpO2:  [98 %-100 %] 100 % (12/31 0500) Last BM Date: 07/10/15  Filed Weights   07/08/15 0500 07/09/15 0600 07/10/15 0500  Weight: 219 lb 9.3 oz (99.6 kg) 215 lb 2.7 oz (97.6 kg) 216 lb 1.6 oz (98.022 kg)    Intake/Output Summary (Last 24 hours) at 07/11/15 0842 Last data filed at 07/11/15 0500  Gross per 24 hour  Intake    240 ml  Output    900 ml  Net   -660 ml    Telemetry: Sinus rhythm and sinus tachycardia with PACs.  Exam:  General: Overweight elderly male, no distress.  HEENT: Conjunctiva and lids normal, oropharynx clear.  Lungs: Decreased breath sounds at the bases with few crackles.  Cardiac: RRR without S3 gallop.  Abdomen: Obese, nontender to palpation. Bowel sounds present.  Extremities: 2-3+ pitting edema.  Lab Results:  Basic Metabolic Panel:  Recent Labs Lab 07/09/15 0600 07/10/15 0500 07/11/15 0630  NA 145 146* 145  K 3.1* 3.2* 3.1*  CL 115* 115* 116*  CO2 17* 18* 20*  GLUCOSE 186* 192* 174*  BUN 20 15 11   CREATININE 1.06 0.95 0.85  CALCIUM 5.7* 6.0* 5.8*  MG 2.1 1.7 1.5*    CBC:  Recent Labs Lab 07/09/15 0600 07/10/15 0500 07/11/15 0630  WBC 9.7 9.5 10.7*  HGB 8.7* 8.3* 7.9*  HCT 26.5* 24.8* 23.7*  MCV 86.6 86.1 86.2  PLT 184 160 148*    Cardiac Enzymes:  Recent Labs Lab 07/05/15 0955 07/05/15 1457 07/05/15 2100  TROPONINI 0.11* 0.12* 0.16*    Echocardiogram: 06/29/2015: Study  Conclusions  - Left ventricle: Hyperdynamic LV function but less cavity obliteration than previous and no LVOT gradient The cavity size was normal. Wall thickness was increased in a pattern of moderate LVH. Systolic function was vigorous. The estimated ejection fraction was in the range of 65% to 70%. Wall motion was normal; there were no regional wall motion abnormalities. Doppler parameters are consistent with elevated ventricular end-diastolic filling pressure. - Left atrium: The atrium was mildly dilated. - Atrial septum: No defect or patent foramen ovale was identified.   Medications:   Scheduled Medications: . antiseptic oral rinse  7 mL Mouth Rinse q12n4p  . atorvastatin  10 mg Oral q1800  . brimonidine  1 drop Right Eye Daily   And  . timolol  1 drop Right Eye Daily  . calcium gluconate  1 g Intravenous Once  . carvedilol  12.5 mg Oral BID WC  . chlorhexidine  15 mL Mouth Rinse BID  . heparin subcutaneous  5,000 Units Subcutaneous 3 times per day  . insulin aspart  0-5 Units Subcutaneous QHS  . insulin aspart  0-9 Units Subcutaneous TID WC  . insulin detemir  6 Units Subcutaneous QHS  . lactose free nutrition  237 mL Oral BID BM  . magnesium oxide  400 mg Oral Daily  . magnesium sulfate 1 - 4 g bolus IVPB  4 g  Intravenous Once  . metroNIDAZOLE  500 mg Oral 3 times per day  . phosphorus  250 mg Oral BID  . potassium chloride  40 mEq Oral BID  . prednisoLONE acetate  1 drop Both Eyes BID  . saccharomyces boulardii  250 mg Oral BID  . tamsulosin  0.4 mg Oral Daily  . vancomycin  500 mg Oral 4 times per day     Infusions: . lactated ringers Stopped (07/07/15 2300)     PRN Medications:  acetaminophen, ondansetron (ZOFRAN) IV, phenol, sodium chloride   Assessment:   1. Acute on chronic diastolic heart failure, LVEF 65-70% with increased filling pressures. He received fluid resuscitation with recent sepsis. Currently he remains well over baseline  weight (198 pounds) and still has substantial leg edema. I reviewed his chart, he has received IV Lasix, but has not been on a standing dose, and is presently not on any diuretics.  2. Mild elevation in troponin I consistent with demand ischemia particularly in setting of recent sepsis with history of difficile colitis. No clear evidence of ACS. No additional ischemic testing is planned at this time.  3. Essential hypertension, recent systolic blood pressure in the 130s. He has been on carvedilol.  4. Electrolyte abnormalities including hypomagnesemia and hypokalemia.  5. Clostridium difficile colitis and sepsis, hemodynamics have improved. He continues on antibiotics per primary team, rectal tube in place, contact precautions.   Plan/Discussion:    Discussed with patient and daughter. I am placing him on standing IV Lasix today. He has a significant amount of volume yet to remove. Continue Coreg at current dose, and titrate if needed. He is on both magnesium and potassium supplements orally, would continue to give additional IV supplements as needed per primary team, ideally potassium around 4.0 and magnesium around 2.0.   Satira Sark, M.D., F.A.C.C.

## 2015-07-11 NOTE — Progress Notes (Signed)
Notified physician that patient has a critical calcium level of 5.8.

## 2015-07-12 DIAGNOSIS — I5033 Acute on chronic diastolic (congestive) heart failure: Secondary | ICD-10-CM

## 2015-07-12 LAB — BASIC METABOLIC PANEL
Anion gap: 11 (ref 5–15)
BUN: 8 mg/dL (ref 6–20)
CHLORIDE: 113 mmol/L — AB (ref 101–111)
CO2: 22 mmol/L (ref 22–32)
Calcium: 6 mg/dL — CL (ref 8.9–10.3)
Creatinine, Ser: 0.74 mg/dL (ref 0.61–1.24)
GFR calc Af Amer: >60 mL/min (ref >60)
GFR calc non Af Amer: >60 mL/min (ref >60)
Glucose, Bld: 164 mg/dL — ABNORMAL HIGH (ref 65–99)
POTASSIUM: 3.2 mmol/L — AB (ref 3.5–5.1)
SODIUM: 146 mmol/L — AB (ref 135–145)

## 2015-07-12 LAB — CBC WITH DIFFERENTIAL/PLATELET
BASOS PCT: 0 %
Basophils Absolute: 0 10*3/uL (ref 0.0–0.1)
EOS ABS: 0.2 10*3/uL (ref 0.0–0.7)
EOS PCT: 2 %
HEMATOCRIT: 23.4 % — AB (ref 39.0–52.0)
HEMOGLOBIN: 7.9 g/dL — AB (ref 13.0–17.0)
LYMPHS PCT: 11 %
Lymphs Abs: 1.2 10*3/uL (ref 0.7–4.0)
MCH: 28.9 pg (ref 26.0–34.0)
MCHC: 33.8 g/dL (ref 30.0–36.0)
MCV: 85.7 fL (ref 78.0–100.0)
MONOS PCT: 6 %
Monocytes Absolute: 0.7 10*3/uL (ref 0.1–1.0)
NEUTROS PCT: 81 %
Neutro Abs: 9.1 10*3/uL — ABNORMAL HIGH (ref 1.7–7.7)
Platelets: 135 10*3/uL — ABNORMAL LOW (ref 150–400)
RBC: 2.73 MIL/uL — ABNORMAL LOW (ref 4.22–5.81)
RDW: 17.8 % — ABNORMAL HIGH (ref 11.5–15.5)
WBC: 11.2 10*3/uL — ABNORMAL HIGH (ref 4.0–10.5)

## 2015-07-12 LAB — GLUCOSE, CAPILLARY
GLUCOSE-CAPILLARY: 147 mg/dL — AB (ref 65–99)
Glucose-Capillary: 150 mg/dL — ABNORMAL HIGH (ref 65–99)
Glucose-Capillary: 143 mg/dL — ABNORMAL HIGH (ref 65–99)
Glucose-Capillary: 110 mg/dL — ABNORMAL HIGH (ref 65–99)

## 2015-07-12 LAB — MAGNESIUM: Magnesium: 1.7 mg/dL (ref 1.7–2.4)

## 2015-07-12 MED ORDER — MAGNESIUM SULFATE 2 GM/50ML IV SOLN
2.0000 g | Freq: Once | INTRAVENOUS | Status: AC
  Administered 2015-07-12: 2 g via INTRAVENOUS
  Filled 2015-07-12: qty 50

## 2015-07-12 MED ORDER — POTASSIUM CHLORIDE CRYS ER 20 MEQ PO TBCR
40.0000 meq | EXTENDED_RELEASE_TABLET | Freq: Three times a day (TID) | ORAL | Status: DC
  Administered 2015-07-12 – 2015-07-15 (×11): 40 meq via ORAL
  Filled 2015-07-12 (×14): qty 2

## 2015-07-12 NOTE — Progress Notes (Signed)
Notified physician lab called with critical calcium level of 6.0.  No orders received from physician.

## 2015-07-12 NOTE — Progress Notes (Signed)
Primary cardiologist: Dr. Darlin Coco  Seen for followup: Diastolic heart failure, elevated troponin I  Subjective:    Reports somewhat less tightness in legs. No chest pain. Appetite fair. Abdomen feels bloated.  Objective:   Temp:  [97.8 F (36.6 C)-98.4 F (36.9 C)] 98.4 F (36.9 C) (01/01 0545) Pulse Rate:  [93-98] 95 (01/01 0545) Resp:  [20-21] 21 (12/31 2244) BP: (132-150)/(57-64) 150/57 mmHg (01/01 0545) SpO2:  [98 %-100 %] 100 % (01/01 0545) Last BM Date: 07/10/15  Filed Weights   07/08/15 0500 07/09/15 0600 07/10/15 0500  Weight: 219 lb 9.3 oz (99.6 kg) 215 lb 2.7 oz (97.6 kg) 216 lb 1.6 oz (98.022 kg)    Intake/Output Summary (Last 24 hours) at 07/12/15 0807 Last data filed at 07/12/15 0115  Gross per 24 hour  Intake    290 ml  Output   3500 ml  Net  -3210 ml    Telemetry: Sinus rhythm and sinus tachycardia.  Exam:  General: Overweight elderly male, no distress.  HEENT: Conjunctiva and lids normal, oropharynx clear.  Lungs: Decreased breath sounds at the bases with few crackles.  Cardiac: RRR without S3 gallop.  Abdomen: Protuberant, nontender to palpation. Bowel sounds present.  Extremities: 2-3+ pitting edema.  Lab Results:  Basic Metabolic Panel:  Recent Labs Lab 07/10/15 0500 07/11/15 0630 07/12/15 0645  NA 146* 145 146*  K 3.2* 3.1* 3.2*  CL 115* 116* 113*  CO2 18* 20* 22  GLUCOSE 192* 174* 164*  BUN 15 11 8   CREATININE 0.95 0.85 0.74  CALCIUM 6.0* 5.8* 6.0*  MG 1.7 1.5* 1.7    CBC:  Recent Labs Lab 07/10/15 0500 07/11/15 0630 07/12/15 0645  WBC 9.5 10.7* 11.2*  HGB 8.3* 7.9* 7.9*  HCT 24.8* 23.7* 23.4*  MCV 86.1 86.2 85.7  PLT 160 148* 135*    Cardiac Enzymes:  Recent Labs Lab 07/05/15 0955 07/05/15 1457 07/05/15 2100  TROPONINI 0.11* 0.12* 0.16*    Echocardiogram: 06/29/2015: Study Conclusions  - Left ventricle: Hyperdynamic LV function but less cavity obliteration than previous and no LVOT  gradient The cavity size was normal. Wall thickness was increased in a pattern of moderate LVH. Systolic function was vigorous. The estimated ejection fraction was in the range of 65% to 70%. Wall motion was normal; there were no regional wall motion abnormalities. Doppler parameters are consistent with elevated ventricular end-diastolic filling pressure. - Left atrium: The atrium was mildly dilated. - Atrial septum: No defect or patent foramen ovale was identified.   Medications:   Scheduled Medications: . antiseptic oral rinse  7 mL Mouth Rinse q12n4p  . atorvastatin  10 mg Oral q1800  . brimonidine  1 drop Right Eye Daily   And  . timolol  1 drop Right Eye Daily  . carvedilol  12.5 mg Oral BID WC  . chlorhexidine  15 mL Mouth Rinse BID  . furosemide  40 mg Intravenous BID  . heparin subcutaneous  5,000 Units Subcutaneous 3 times per day  . insulin aspart  0-5 Units Subcutaneous QHS  . insulin aspart  0-9 Units Subcutaneous TID WC  . insulin detemir  6 Units Subcutaneous QHS  . lactose free nutrition  237 mL Oral BID BM  . magnesium oxide  400 mg Oral Daily  . metroNIDAZOLE  500 mg Oral 3 times per day  . phosphorus  500 mg Oral BID  . potassium chloride  40 mEq Oral BID  . prednisoLONE acetate  1 drop Both  Eyes BID  . saccharomyces boulardii  250 mg Oral BID  . tamsulosin  0.4 mg Oral Daily  . vancomycin  500 mg Oral 4 times per day    Infusions: . lactated ringers Stopped (07/07/15 2300)    PRN Medications: acetaminophen, ondansetron (ZOFRAN) IV, phenol, sodium chloride   Assessment:   1. Acute on chronic diastolic heart failure, LVEF 65-70% with increased filling pressures. He received fluid resuscitation with recent sepsis. Currently he remains well over baseline weight (198 pounds) but is diuresing well now on standing IV Lasix.  2. Mild elevation in troponin I consistent with demand ischemia particularly in setting of recent sepsis with history of  difficile colitis. No clear evidence of ACS. No additional ischemic testing is planned at this time.  3. Essential hypertension, recent systolic blood pressure in the 140s-150s. He has been on carvedilol.  4. Electrolyte abnormalities including hypomagnesemia and hypokalemia.  5. Clostridium difficile colitis and sepsis, hemodynamics have improved. He continues on antibiotics per primary team, rectal tube in place, contact precautions.   Plan/Discussion:    Continue current dose standing IV Lasix, creatinine is stable and he is diuresing well now. He has a significant amount of volume yet to remove. Continue Coreg at current dose, and titrate if needed. He is on both magnesium and potassium supplements orally, would continue to give additional IV supplements as needed per primary team, ideally potassium around 4.0 and magnesium around 2.0.   Satira Sark, M.D., F.A.C.C.

## 2015-07-12 NOTE — Progress Notes (Signed)
[facility] PROGRESS NOTE  Jeremy Jennings [id number] DOB: 08-26-1935 DOA: 07/04/2015 PCP: [name], MD  Brief narrative 80 year old nursing home resident male with history of chronic diastolic CHF and type 2 diabetes mellitus presenting with fever and diarrhea. He was recently hospitalized for small bowel obstruction requiring exploratory laparotomy with small bowel resection which was complicated by a brief episode of cardiac arrest. He also had UTI and was treated with antibiotics and discharged on 12/23. Patient on presentation had septic shock secondary to C. difficile colitis and was admitted to ICU. Transferred care to hospitalist service on 12/28.  Assessment/Plan: Principal problem Septic shock secondary to severe C. difficile colitis Oral vancomycin and Flagyl (day 8/14). Diarrhea seems to be improving.   Acute hypoxic respiratory failure Secondary to acute on chronic diastolic CHF. Patient was initially hypovolemic requiring IV fluids. No volume overloaded. On scheduled Lasix. Continue O2 via nasal cannula.   Acute on chronic diastolic CHF On scheduled IV Lasix. Almost 20 pound weight gain since admission. Monitor strict I/O and daily weight. Diuresing well past 24 hours. Cardiology following. replenish low k and magnesium. Continue Coreg. Recent 2-D echo with normal EF and no wall motion abnormality.   hypokalemia/hypocalcemia/hypomagnesemia/hypophosphatemia Replenished. Stable on telemetry. Increased phosphate dose.  Recent small bowel obstruction. Status post exploratory laparotomy and small bowel resection. Follow-up with surgery as outpatient.  Anemia of chronic disease Hemoglobin low but stable  Type 2 diabetes mellitus Continue sliding scale coverage. Added pre-meal aspart  Code Status: Full code Family Communication: Daughter at bedside Disposition Plan: Skilled nursing facility in the next 2-3 days once diuresed  adequately   Consultants:  Cardiology  Procedures:  CT chest with contrast  Antibiotics:  None  HPI/Subjective: Seen and examined. Reports his breathing to be better.  Objective: Filed Vitals:   07/11/15 2244 07/12/15 0545  BP: 140/64 150/57  Pulse: 93 95  Temp: 98.1 F (36.7 C) 98.4 F (36.9 C)  Resp: 21     Intake/Output Summary (Last 24 hours) at 07/12/15 1208 Last data filed at 07/12/15 0115  Gross per 24 hour  Intake    290 ml  Output   3500 ml  Net  -3210 ml   Filed Weights   07/08/15 0500 07/09/15 0600 07/10/15 0500  Weight: 99.6 kg (219 lb 9.3 oz) 97.6 kg (215 lb 2.7 oz) 98.022 kg (216 lb 1.6 oz)    Exam:   General:  Elderly male not in distress  HEENT: No pallor, moist mucosa, no JVD, left IJ  Chest: Diminished bibasilar breath sounds  CVS: Normal S1 and S2, no murmurs rub or gallop  GI: Soft, nondistended, nontender, bowel sounds present, swollen penis and scrotum with Foley catheter (improved) , rectal tube in place  Musculoskeletal: 2+ pitting edema bilaterally (slightly better from yesterday)  CNS: Alert and oriented  Data Reviewed: Basic Metabolic Panel:  Recent Labs Lab 07/06/15 0400  07/08/15 0515 07/09/15 0600 07/10/15 0500 07/11/15 0630 07/12/15 0645  NA 144  < > 147* 145 146* 145 146*  K 4.2  < > 3.0* 3.1* 3.2* 3.1* 3.2*  CL 115*  < > 120* 115* 115* 116* 113*  CO2 15*  < > 15* 17* 18* 20* 22  GLUCOSE 141*  < > 168* 186* 192* 174* 164*  BUN 23*  < > 26* 20 15 11 8   CREATININE 1.53*  < > 1.09 1.06 0.95 0.85 0.74  CALCIUM 5.8*  < > 5.9* 5.7* 6.0* 5.8* 6.0*  MG 1.6*  --  2.4 2.1 1.7 1.5* 1.7  PHOS 3.4  --  1.7* 1.3* 1.2* 1.2*  --   < > = values in this interval not displayed. Liver Function Tests:  Recent Labs Lab 07/06/15 0400 07/07/15 0500 07/08/15 0515 07/09/15 0600 07/10/15 0500 07/11/15 0630  AST 60* 25  --   --   --   --   ALT 34 25  --   --   --   --   ALKPHOS 486* 494*  --   --   --   --   BILITOT 0.4  0.6  --   --   --   --   PROT 5.6* 5.6*  --   --   --   --   ALBUMIN 2.0* 1.8* 1.8* 1.7* 1.7* 1.6*   No results for input(s): LIPASE, AMYLASE in the last 168 hours. No results for input(s): AMMONIA in the last 168 hours. CBC:  Recent Labs Lab 07/08/15 0515 07/09/15 0600 07/10/15 0500 07/11/15 0630 07/12/15 0645  WBC 13.2* 9.7 9.5 10.7* 11.2*  NEUTROABS 11.1* 7.1 7.2 8.4* 9.1*  HGB 8.9* 8.7* 8.3* 7.9* 7.9*  HCT 26.7* 26.5* 24.8* 23.7* 23.4*  MCV 86.4 86.6 86.1 86.2 85.7  PLT 193 184 160 148* 135*   Cardiac Enzymes:  Recent Labs Lab 07/05/15 1457 07/05/15 2100  TROPONINI 0.12* 0.16*   BNP (last 3 results)  Recent Labs  06/19/15 1120 06/23/15 0530 07/05/15 0955  BNP 258.8* 147.8* 348.0*    ProBNP (last 3 results) No results for input(s): PROBNP in the last 8760 hours.  CBG:  Recent Labs Lab 07/11/15 0801 07/11/15 1136 07/11/15 1641 07/11/15 2247 07/12/15 0733  GLUCAP 148* 183* 229* 206* 147*    Recent Results (from the past 240 hour(s))  Blood Culture (routine x 2)     Status: None   Collection Time: 07/04/15 11:25 PM  Result Value Ref Range Status   Specimen Description BLOOD LEFT ANTECUBITAL  Final   Special Requests IN PEDIATRIC BOTTLE 3ML  Final   Culture   Final    NO GROWTH 5 DAYS Performed at [facility]    Report Status 07/10/2015 FINAL  Final  Blood Culture (routine x 2)     Status: None   Collection Time: 07/04/15 11:25 PM  Result Value Ref Range Status   Specimen Description BLOOD RIGHT ANTECUBITAL  Final   Special Requests BOTTLES DRAWN AEROBIC AND ANAEROBIC 5ML  Final   Culture   Final    NO GROWTH 5 DAYS Performed at [facility]    Report Status 07/10/2015 FINAL  Final  C difficile quick scan w PCR reflex     Status: Abnormal   Collection Time: 07/05/15  2:34 AM  Result Value Ref Range Status   C Diff antigen POSITIVE (A) NEGATIVE Final   C Diff toxin POSITIVE (A) NEGATIVE Final   C Diff interpretation  Positive for toxigenic C. difficile  Final    Comment: CRITICAL RESULT CALLED TO, READ BACK BY AND VERIFIED WITH: MARQUEZ,A/ED @0341  ON 07/05/15 BY KARCZEWSKI,S.   MRSA PCR Screening     Status: None   Collection Time: 07/05/15  7:00 AM  Result Value Ref Range Status   MRSA by PCR NEGATIVE NEGATIVE Final    Comment:        The GeneXpert MRSA Assay (FDA approved for NASAL specimens only), is one component of a comprehensive MRSA colonization surveillance program. It is not intended to diagnose MRSA infection nor to  guide or monitor treatment for MRSA infections.   Urine culture     Status: None   Collection Time: 07/07/15  6:24 AM  Result Value Ref Range Status   Specimen Description URINE, CATHETERIZED  Final   Special Requests NONE  Final   Culture   Final    NO GROWTH 1 DAY Performed at [facility]    Report Status 07/08/2015 FINAL  Final     Studies: No results found.  Scheduled Meds: . antiseptic oral rinse  7 mL Mouth Rinse q12n4p  . atorvastatin  10 mg Oral q1800  . brimonidine  1 drop Right Eye Daily   And  . timolol  1 drop Right Eye Daily  . carvedilol  12.5 mg Oral BID WC  . chlorhexidine  15 mL Mouth Rinse BID  . furosemide  40 mg Intravenous BID  . heparin subcutaneous  5,000 Units Subcutaneous 3 times per day  . insulin aspart  0-5 Units Subcutaneous QHS  . insulin aspart  0-9 Units Subcutaneous TID WC  . insulin detemir  6 Units Subcutaneous QHS  . lactose free nutrition  237 mL Oral BID BM  . magnesium oxide  400 mg Oral Daily  . magnesium sulfate 1 - 4 g bolus IVPB  2 g Intravenous Once  . metroNIDAZOLE  500 mg Oral 3 times per day  . phosphorus  500 mg Oral BID  . potassium chloride  40 mEq Oral TID  . prednisoLONE acetate  1 drop Both Eyes BID  . saccharomyces boulardii  250 mg Oral BID  . tamsulosin  0.4 mg Oral Daily  . vancomycin  500 mg Oral 4 times per day   Continuous Infusions: . lactated ringers Stopped (07/07/15 2300)      Time spent: 25 minutes    Buford Bremer  [facility] Pager [phone] If 7PM-7AM, please contact night-coverage at [email or web address], password [facility] 07/12/2015, 12:08 PM  LOS: 7 days

## 2015-07-13 LAB — CBC WITH DIFFERENTIAL/PLATELET
Basophils Absolute: 0 10*3/uL (ref 0.0–0.1)
Basophils Relative: 0 %
EOS PCT: 2 %
Eosinophils Absolute: 0.2 10*3/uL (ref 0.0–0.7)
HEMATOCRIT: 24.2 % — AB (ref 39.0–52.0)
Hemoglobin: 8 g/dL — ABNORMAL LOW (ref 13.0–17.0)
LYMPHS ABS: 1.1 10*3/uL (ref 0.7–4.0)
Lymphocytes Relative: 10 %
MCH: 28.5 pg (ref 26.0–34.0)
MCHC: 33.1 g/dL (ref 30.0–36.0)
MCV: 86.1 fL (ref 78.0–100.0)
MONO ABS: 0.7 10*3/uL (ref 0.1–1.0)
MONOS PCT: 6 %
NEUTROS ABS: 8.9 10*3/uL — AB (ref 1.7–7.7)
Neutrophils Relative %: 82 %
PLATELETS: 154 10*3/uL (ref 150–400)
RBC: 2.81 MIL/uL — ABNORMAL LOW (ref 4.22–5.81)
RDW: 17.8 % — AB (ref 11.5–15.5)
WBC: 10.9 10*3/uL — ABNORMAL HIGH (ref 4.0–10.5)

## 2015-07-13 LAB — MAGNESIUM: MAGNESIUM: 1.6 mg/dL — AB (ref 1.7–2.4)

## 2015-07-13 LAB — BASIC METABOLIC PANEL
Anion gap: 7 (ref 5–15)
BUN: 7 mg/dL (ref 6–20)
CALCIUM: 6 mg/dL — AB (ref 8.9–10.3)
CO2: 24 mmol/L (ref 22–32)
CREATININE: 0.73 mg/dL (ref 0.61–1.24)
Chloride: 116 mmol/L — ABNORMAL HIGH (ref 101–111)
GFR calc Af Amer: >60 mL/min (ref >60)
GFR calc non Af Amer: >60 mL/min (ref >60)
GLUCOSE: 129 mg/dL — AB (ref 65–99)
Potassium: 3.2 mmol/L — ABNORMAL LOW (ref 3.5–5.1)
Sodium: 147 mmol/L — ABNORMAL HIGH (ref 135–145)

## 2015-07-13 LAB — GLUCOSE, CAPILLARY
GLUCOSE-CAPILLARY: 167 mg/dL — AB (ref 65–99)
Glucose-Capillary: 113 mg/dL — ABNORMAL HIGH (ref 65–99)
Glucose-Capillary: 132 mg/dL — ABNORMAL HIGH (ref 65–99)
Glucose-Capillary: 161 mg/dL — ABNORMAL HIGH (ref 65–99)

## 2015-07-13 LAB — PHOSPHORUS: Phosphorus: 1.9 mg/dL — ABNORMAL LOW (ref 2.5–4.6)

## 2015-07-13 MED ORDER — POTASSIUM CHLORIDE CRYS ER 20 MEQ PO TBCR
40.0000 meq | EXTENDED_RELEASE_TABLET | Freq: Once | ORAL | Status: AC
  Administered 2015-07-13: 40 meq via ORAL
  Filled 2015-07-13: qty 2

## 2015-07-13 MED ORDER — MAGNESIUM SULFATE 4 GM/100ML IV SOLN
4.0000 g | Freq: Once | INTRAVENOUS | Status: AC
  Administered 2015-07-13: 4 g via INTRAVENOUS
  Filled 2015-07-13: qty 100

## 2015-07-13 NOTE — Clinical Social Work Note (Signed)
CSW spoke with pt's daughter, [name] [phone] via phone. [name] has selected [facility], SNF for placement upon d/c. CSW spoke with [place] in admissions at [facility] who requested an updated FL2. Per Marden Noble, it won't be a problem to hold a bed for pt. D/C date still [facility].   Cindra Presume, LCSW [phone] Hospital psychiatric & 5E, 5W [id number] Licensed Clinical Social Worker

## 2015-07-13 NOTE — Progress Notes (Signed)
[facility] PROGRESS NOTE  Jeremy Jennings [id number] DOB: 1935/09/24 DOA: 07/04/2015 PCP: [name], MD  Brief narrative 80 year old nursing home resident male with history of chronic diastolic CHF and type 2 diabetes mellitus presenting with fever and diarrhea. He was recently hospitalized for small bowel obstruction requiring exploratory laparotomy with small bowel resection which was complicated by a brief episode of cardiac arrest. He also had UTI and was treated with antibiotics and discharged on 12/23. Patient on presentation had septic shock secondary to C. difficile colitis and was admitted to ICU. Transferred care to hospitalist service on 12/28.  Assessment/Plan: Principal problem Septic shock secondary to severe C. difficile colitis Oral vancomycin and Flagyl (day . 9/14). Diarrhea seems to be improving. Remove rectal tube. Start physical therapy.  Acute hypoxic respiratory failure Secondary to acute on chronic diastolic CHF. Patient was initially hypovolemic requiring IV fluids. No volume overloaded. On scheduled Lasix. Diuresing well. Continue O2 via nasal cannula.   Acute on chronic diastolic CHF On scheduled IV Lasix. Almost 20 pound weight gain since admission. Monitor strict I/O and daily weight. (Still 5lb more than admission weight) . Diuresing well past 24 hours. Cardiology following. . Replenishing aggressively. Continue Coreg. Recent 2-D echo with normal EF and no wall motion abnormality.   hypokalemia/hypocalcemia/hypomagnesemia/hypophosphatemia Continue to have low potassium and magnesium with frequent PVCs on the monitor. Replenishing aggressively.   Recent small bowel obstruction. Status post exploratory laparotomy and small bowel resection. Follow-up with surgery as outpatient.  Anemia of chronic disease Hemoglobin low but stable  Type 2 diabetes mellitus Continue sliding scale coverage. Added pre-meal aspart  Code Status: Full code Family  Communication: Daughter at bedside Disposition Plan: Skilled nursing facility in the next 2-3 days once diuresed adequately   Consultants:  Cardiology  Procedures:  CT chest with contrast  Antibiotics:  None  HPI/Subjective: Seen and examined. Symptoms better. Wants to get that.  Objective: Filed Vitals:   07/12/15 2209 07/13/15 0616  BP: 130/70 128/65  Pulse: 91 95  Temp: 98.2 F (36.8 C) 98.2 F (36.8 C)  Resp: 19 20    Intake/Output Summary (Last 24 hours) at 07/13/15 1335 Last data filed at 07/13/15 0617  Gross per 24 hour  Intake    120 ml  Output   2950 ml  Net  -2830 ml   Filed Weights   07/09/15 0600 07/10/15 0500 07/13/15 0800  Weight: 97.6 kg (215 lb 2.7 oz) 98.022 kg (216 lb 1.6 oz) 91.853 kg (202 lb 8 oz)    Exam:   General:   not in distress  HEENT: moist mucosa,left IJ  Chest: clear b/l  CVS: Normal S1 and S2, no murmurs rub or gallop  GI: Soft, nondistended, nontender, bowel sounds present, swollen penis and scrotum improved , rectal tube in place  Musculoskeletal: 2+ pitting edema bilaterally   CNS: Alert and oriented  Data Reviewed: Basic Metabolic Panel:  Recent Labs Lab 07/08/15 0515 07/09/15 0600 07/10/15 0500 07/11/15 0630 07/12/15 0645 07/13/15 0410  NA 147* 145 146* 145 146* 147*  K 3.0* 3.1* 3.2* 3.1* 3.2* 3.2*  CL 120* 115* 115* 116* 113* 116*  CO2 15* 17* 18* 20* 22 24  GLUCOSE 168* 186* 192* 174* 164* 129*  BUN 26* 20 15 11 8 7   CREATININE 1.09 1.06 0.95 0.85 0.74 0.73  CALCIUM 5.9* 5.7* 6.0* 5.8* 6.0* 6.0*  MG 2.4 2.1 1.7 1.5* 1.7 1.6*  PHOS 1.7* 1.3* 1.2* 1.2*  --  1.9*   Liver  Function Tests:  Recent Labs Lab 07/07/15 0500 07/08/15 0515 07/09/15 0600 07/10/15 0500 07/11/15 0630  AST 25  --   --   --   --   ALT 25  --   --   --   --   ALKPHOS 494*  --   --   --   --   BILITOT 0.6  --   --   --   --   PROT 5.6*  --   --   --   --   ALBUMIN 1.8* 1.8* 1.7* 1.7* 1.6*   No results for input(s):  LIPASE, AMYLASE in the last 168 hours. No results for input(s): AMMONIA in the last 168 hours. CBC:  Recent Labs Lab 07/09/15 0600 07/10/15 0500 07/11/15 0630 07/12/15 0645 07/13/15 0410  WBC 9.7 9.5 10.7* 11.2* 10.9*  NEUTROABS 7.1 7.2 8.4* 9.1* 8.9*  HGB 8.7* 8.3* 7.9* 7.9* 8.0*  HCT 26.5* 24.8* 23.7* 23.4* 24.2*  MCV 86.6 86.1 86.2 85.7 86.1  PLT 184 160 148* 135* 154   Cardiac Enzymes: No results for input(s): CKTOTAL, CKMB, CKMBINDEX, TROPONINI in the last 168 hours. BNP (last 3 results)  Recent Labs  06/19/15 1120 06/23/15 0530 07/05/15 0955  BNP 258.8* 147.8* 348.0*    ProBNP (last 3 results) No results for input(s): PROBNP in the last 8760 hours.  CBG:  Recent Labs Lab 07/12/15 1214 07/12/15 1700 07/12/15 2208 07/13/15 0757 07/13/15 1241  GLUCAP 150* 143* 110* 113* 167*    Recent Results (from the past 240 hour(s))  Blood Culture (routine x 2)     Status: None   Collection Time: 07/04/15 11:25 PM  Result Value Ref Range Status   Specimen Description BLOOD LEFT ANTECUBITAL  Final   Special Requests IN PEDIATRIC BOTTLE 3ML  Final   Culture   Final    NO GROWTH 5 DAYS Performed at [facility]    Report Status 07/10/2015 FINAL  Final  Blood Culture (routine x 2)     Status: None   Collection Time: 07/04/15 11:25 PM  Result Value Ref Range Status   Specimen Description BLOOD RIGHT ANTECUBITAL  Final   Special Requests BOTTLES DRAWN AEROBIC AND ANAEROBIC 5ML  Final   Culture   Final    NO GROWTH 5 DAYS Performed at [facility]    Report Status 07/10/2015 FINAL  Final  C difficile quick scan w PCR reflex     Status: Abnormal   Collection Time: 07/05/15  2:34 AM  Result Value Ref Range Status   C Diff antigen POSITIVE (A) NEGATIVE Final   C Diff toxin POSITIVE (A) NEGATIVE Final   C Diff interpretation Positive for toxigenic C. difficile  Final    Comment: CRITICAL RESULT CALLED TO, READ BACK BY AND VERIFIED WITH: MARQUEZ,A/ED  @0341  ON 07/05/15 BY KARCZEWSKI,S.   MRSA PCR Screening     Status: None   Collection Time: 07/05/15  7:00 AM  Result Value Ref Range Status   MRSA by PCR NEGATIVE NEGATIVE Final    Comment:        The GeneXpert MRSA Assay (FDA approved for NASAL specimens only), is one component of a comprehensive MRSA colonization surveillance program. It is not intended to diagnose MRSA infection nor to guide or monitor treatment for MRSA infections.   Urine culture     Status: None   Collection Time: 07/07/15  6:24 AM  Result Value Ref Range Status   Specimen Description URINE, CATHETERIZED  Final   Special Requests NONE  Final   Culture   Final    NO GROWTH 1 DAY Performed at [facility]    Report Status 07/08/2015 FINAL  Final     Studies: No results found.  Scheduled Meds: . antiseptic oral rinse  7 mL Mouth Rinse q12n4p  . atorvastatin  10 mg Oral q1800  . brimonidine  1 drop Right Eye Daily   And  . timolol  1 drop Right Eye Daily  . carvedilol  12.5 mg Oral BID WC  . chlorhexidine  15 mL Mouth Rinse BID  . furosemide  40 mg Intravenous BID  . heparin subcutaneous  5,000 Units Subcutaneous 3 times per day  . insulin aspart  0-5 Units Subcutaneous QHS  . insulin aspart  0-9 Units Subcutaneous TID WC  . insulin detemir  6 Units Subcutaneous QHS  . lactose free nutrition  237 mL Oral BID BM  . magnesium oxide  400 mg Oral Daily  . metroNIDAZOLE  500 mg Oral 3 times per day  . phosphorus  500 mg Oral BID  . potassium chloride  40 mEq Oral TID  . prednisoLONE acetate  1 drop Both Eyes BID  . saccharomyces boulardii  250 mg Oral BID  . tamsulosin  0.4 mg Oral Daily  . vancomycin  500 mg Oral 4 times per day   Continuous Infusions: . lactated ringers Stopped (07/07/15 2300)     Time spent: 25 minutes    Callie Facey  [facility] Pager [phone] If 7PM-7AM, please contact night-coverage at [email or web address], password [facility] 07/13/2015, 1:35 PM  LOS: 8 days

## 2015-07-13 NOTE — Progress Notes (Signed)
This shift critical lab value received Ca+ 6.0. Attending [place] notified. No new orders at this time. Will continue to monitor pt and intervene appropriately.

## 2015-07-13 NOTE — Progress Notes (Signed)
Physical Therapy Treatment Patient Details Name: Jeremy Jennings MRN: [id number] DOB: 02-May-1936 Today's Date: 07/13/2015    History of Present Illness 80 yo black male who has a history of stage 4 prostate cancer, HTN, DM, tachycardia, recent GI bleed.  Dx of SBO, s/p resection 06/12/15.  Marland Kitchen Acute respiratoiry failure and cardiac arrest during hospital stay. Intubated 12/13. Extubated 12/14. DC to SNF 12/22, returned 12/24 with c diff.    PT Comments    Patient is very motivated to participate, slowly improving, sit to stand not as difficult. Instructed in lower extremity exercises.  Follow Up Recommendations  SNF;Supervision/Assistance - 24 hour     Equipment Recommendations  None recommended by PT    Recommendations for Other Services       Precautions / Restrictions Precautions Precautions: Fall Precaution Comments: abd surgery, c diff, flexiseal,     Mobility  Bed Mobility               General bed mobility comments: oob  Transfers Overall transfer level: Needs assistance Equipment used: Rolling walker (2 wheeled) Transfers: Sit to/from Stand Sit to Stand: Mod assist;+2 physical assistance;+2 safety/equipment;From elevated surface Stand pivot transfers: +2 safety/equipment;Min assist       General transfer comment: assist to rise and stabilize.  Performed 2xs  Ambulation/Gait Ambulation/Gait assistance: Min assist;+2 physical assistance;+2 safety/equipment Ambulation Distance (Feet): 50 Feet Assistive device: Rolling walker (2 wheeled) Gait Pattern/deviations: WFL(Within Functional Limits);Step-through pattern     General Gait Details: slow speed,  assist with turning, cues for for safety, HR 129, sats 100% RA.   Stairs            Wheelchair Mobility    Modified Rankin (Stroke Patients Only)       Balance Overall balance assessment: Needs assistance Sitting-balance support: Feet supported Sitting balance-Leahy Scale: Fair Sitting balance  - Comments: difficulty leaning forward in sitting, tends  be backward, trunk edema.   Standing balance support: During functional activity Standing balance-Leahy Scale: Poor                      Cognition Arousal/Alertness: Awake/alert Behavior During Therapy: WFL for tasks assessed/performed Overall Cognitive Status: Within Functional Limits for tasks assessed                      Exercises General Exercises - Lower Extremity Quad Sets: AROM;Both;5 reps Long Arc Quad: AROM;Both;10 reps;Seated Hip ABduction/ADduction: AROM;Both;10 reps;Seated Hip Flexion/Marching: AAROM;Both;10 reps;Seated Other Exercises Other Exercises: provided theraband and reviewed written HEP focusing on horizontal abduction and shoulder flexion.  Pt tends to flex elbow during exercise.  Will provide longer piece of Level one band.  He can use Level one or two for horizontal abduction.  Also educated just to actively move RUE against gravity ; level one theraband is too much resistance, even with long piece.    General Comments        Pertinent Vitals/Pain Pain Assessment: 0-10 Faces Pain Scale: Hurts little more Pain Location: buttocks Pain Descriptors / Indicators: Tender Pain Intervention(s): Limited activity within patient's tolerance;Monitored during session;Repositioned    Home Living                      Prior Function            PT Goals (current goals can now be found in the care plan section) Progress towards PT goals: Progressing toward goals    Frequency  Min 3X/week  PT Plan Current plan remains appropriate    Co-evaluation PT/OT/SLP Co-Evaluation/Treatment: Yes Reason for Co-Treatment: Complexity of the patient's impairments (multi-system involvement) PT goals addressed during session: Mobility/safety with mobility OT goals addressed during session: ADL's and self-care     End of Session Equipment Utilized During Treatment: Gait belt Activity  Tolerance: Patient tolerated treatment well Patient left: in chair;with call bell/phone within reach;with family/visitor present     Time: [id number] PT Time Calculation (min) (ACUTE ONLY): 35 min  Charges:  $Gait Training: 8-22 mins                    G Codes:      Claretha Cooper 07/13/2015, 4:13 PM Tresa Endo PT [phone]

## 2015-07-13 NOTE — Clinical Social Work Note (Signed)
CSW attempted to reach pt's daughter, [name] [phone], regarding bed choice. CSW was unable to leave a voicemail; inbox was full. CSW will continue to try and reach pt's daughter.   Cindra Presume, LCSW [phone] Hospital psychiatric & 5E, 5W [phone] Licensed Clinical Social Worker

## 2015-07-13 NOTE — Progress Notes (Signed)
Patient Name: Jeremy Jennings Date of Encounter: 07/13/2015  Principal Problem:   Septic shock ([place]) Active Problems:   Sepsis ([place])   C. difficile colitis   Acute renal failure ([place])   Elevated troponin I level   Acute diastolic (congestive) heart failure ([place])   Hypokalemia   Hypomagnesemia   Hypophosphatemia   Primary Cardiologist: Dr. Mare Ferrari  Patient Profile: 80 y.o. male with PMH of HTN, HLD, Type 2 DM, and Stage 4 Prostate Cancer recently admitted from 06/11/2015 to 07/03/2015 for a small bowel obstruction. Represented on 07/04/2015 with Septic Shock secondary to severe C.diff Colitis. Cardiology consulted for elevated troponin values.  SUBJECTIVE: Diuresed an additional 4L yesterday - now 5L negative. Weight not recorded today. Feels less swollen. Still with dark, liquidy stool output.  OBJECTIVE Filed Vitals:   07/12/15 0545 07/12/15 1817 07/12/15 2209 07/13/15 0616  BP: 150/57 118/69 130/70 128/65  Pulse: 95 89 91 95  Temp: 98.4 F (36.9 C) 98.2 F (36.8 C) 98.2 F (36.8 C) 98.2 F (36.8 C)  TempSrc: Axillary Oral Oral Oral  Resp:  20 19 20   Height:      Weight:      SpO2: 100% 100% 100% 100%    Intake/Output Summary (Last 24 hours) at 07/13/15 0749 Last data filed at 07/13/15 0617  Gross per 24 hour  Intake    120 ml  Output   4750 ml  Net  -4630 ml   Filed Weights   07/08/15 0500 07/09/15 0600 07/10/15 0500  Weight: 219 lb 9.3 oz (99.6 kg) 215 lb 2.7 oz (97.6 kg) 216 lb 1.6 oz (98.022 kg)    PHYSICAL EXAM General: Well developed, well nourished, male in no acute distress. Head: Normocephalic, atraumatic.  Neck: Supple without bruits, JVD 5 cm Lungs:  Resp regular and unlabored, rales bases. Heart: RRR, S1, S2, no S3, S4, or murmur; no rub. Abdomen: Soft, non-tender, non-distended, BS + x 4.  Extremities: No clubbing, cyanosis, 1+ edema.  Neuro: Alert and oriented X 3. Moves all extremities spontaneously. Psych: Normal  affect.  LABS: CBC:  Recent Labs  07/12/15 0645 07/13/15 0410  WBC 11.2* 10.9*  NEUTROABS 9.1* 8.9*  HGB 7.9* 8.0*  HCT 23.4* 24.2*  MCV 85.7 86.1  PLT 135* [id number]   Basic Metabolic Panel:  Recent Labs  07/11/15 0630 07/12/15 0645 07/13/15 0410  NA 145 146* 147*  K 3.1* 3.2* 3.2*  CL 116* 113* 116*  CO2 20* 22 24  GLUCOSE 174* 164* 129*  BUN 11 8 7   CREATININE 0.85 0.74 0.73  CALCIUM 5.8* 6.0* 6.0*  MG 1.5* 1.7 1.6*  PHOS 1.2*  --   --    Liver Function Tests:  Recent Labs  07/11/15 0630  ALBUMIN 1.6*   BNP:  B NATRIURETIC PEPTIDE  Date/Time Value Ref Range Status  07/05/2015 09:55 AM 348.0* 0.0 - 100.0 pg/mL Final  06/23/2015 05:30 AM 147.8* 0.0 - 100.0 pg/mL Final   TELE: SR, PVCs are frequent        Current Medications:  . antiseptic oral rinse  7 mL Mouth Rinse q12n4p  . atorvastatin  10 mg Oral q1800  . brimonidine  1 drop Right Eye Daily   And  . timolol  1 drop Right Eye Daily  . carvedilol  12.5 mg Oral BID WC  . chlorhexidine  15 mL Mouth Rinse BID  . furosemide  40 mg Intravenous BID  . heparin subcutaneous  5,000 Units Subcutaneous 3  times per day  . insulin aspart  0-5 Units Subcutaneous QHS  . insulin aspart  0-9 Units Subcutaneous TID WC  . insulin detemir  6 Units Subcutaneous QHS  . lactose free nutrition  237 mL Oral BID BM  . magnesium oxide  400 mg Oral Daily  . magnesium sulfate 1 - 4 g bolus IVPB  4 g Intravenous Once  . metroNIDAZOLE  500 mg Oral 3 times per day  . phosphorus  500 mg Oral BID  . potassium chloride  40 mEq Oral TID  . potassium chloride  40 mEq Oral Once  . prednisoLONE acetate  1 drop Both Eyes BID  . saccharomyces boulardii  250 mg Oral BID  . tamsulosin  0.4 mg Oral Daily  . vancomycin  500 mg Oral 4 times per day   . lactated ringers Stopped (07/07/15 2300)    ASSESSMENT AND PLAN: 1. Elevated Troponin - troponin peak 0.16 in the setting of septic shock secondary to C.diff colitis. - felt demand  ischemia in the setting of his acute illness. - denies any anginal symptoms. EKG shows no acute ischemic changes. - no invasive workup at this time.  2. Acute on Chronic Diastolic CHF - EF [id number] on recent echo with no wall motion abnormalities. - Is +2.6L this admission due to need for hydration in setting of septic shock. - Continue with daily weights and strict I&O's. Creatinine stable despite diuresis.  - Continue IV diuresis - goal weight <200 lbs.    3. HTN - currently well-controlled.  4. Sinus tachycardia with Frequent PAC's, PVCs - HR stable - PVC's are less frequent  5. Septic Shock secondary to Severe C.diff Colitis - resolved  6. Hypokalemia - 3.2 today - continue to replete.  7. Hypomagnesemia - improved but still w/ frequent vent ectopy - supplement and follow  8. Anemia - defer to medicine service- seems stable at around 8/24.  Principal Problem:   Septic shock ([place]) Active Problems:   Sepsis ([place])   C. difficile colitis   Acute renal failure ([place])   Elevated troponin I level   Acute diastolic (congestive) heart failure ([place])   Hypokalemia   Hypomagnesemia   Hypophosphatemia  Pixie Casino, MD, [facility] Attending Cardiologist [facility]  Nadean Corwin [facility] 7:49 AM 07/13/2015

## 2015-07-13 NOTE — Progress Notes (Signed)
   07/13/15 1400  OT Visit Information  Last OT Received On 07/13/15  Assistance Needed +2  History of Present Illness 80 yo black male who has a history of stage 4 prostate cancer, HTN, DM, tachycardia, recent GI bleed.  Dx of SBO, s/p resection 06/12/15.  Marland Kitchen Acute respiratoiry failure and cardiac arrest during hospital stay. Intubated 12/13. Extubated 12/14. DC to SNF 12/22, returned 12/24 with c diff.  OT Time Calculation  OT Start Time (ACUTE ONLY) 1413  OT Stop Time (ACUTE ONLY) 1426  OT Time Calculation (min) 13 min  Precautions  Precautions Fall  Precaution Comments abd surgery, c diff, flexiseal,   Pain Assessment  Pain Assessment 0-10  Cognition  Arousal/Alertness Awake/alert  Behavior During Therapy WFL for tasks assessed/performed  Overall Cognitive Status Within Functional Limits for tasks assessed  Exercises  Exercises Other exercises  Other Exercises  Other Exercises provided theraband and reviewed written HEP focusing on horizontal abduction and shoulder flexion.  Pt tends to flex elbow during exercise.  Will provide longer piece of Level one band.  He can use Level one or two for horizontal abduction.  Also educated just to actively move RUE against gravity ; level one theraband is too much resistance, even with long piece.  OT Assessment/Plan  OT Frequency (ACUTE ONLY) Min 2X/week  Follow Up Recommendations SNF  OT Equipment 3 in 1 bedside comode  OT Goal Progression  Progress towards OT goals Progressing toward goals (modified HEP goal)  OT General Charges  $OT Visit 1 Procedure  OT Treatments  $Therapeutic Exercise 8-22 mins  Lesle Chris, OTR/L [phone] 07/13/2015

## 2015-07-13 NOTE — Progress Notes (Signed)
Occupational Therapy Treatment Patient Details Name: Jeremy Jennings MRN: [id number] DOB: August 09, 1935 Today's Date: 07/13/2015    History of present illness 80 yo black male who has a history of stage 4 prostate cancer, HTN, DM, tachycardia, recent GI bleed.  Dx of SBO, s/p resection 06/12/15.  Marland Kitchen Acute respiratoiry failure and cardiac arrest during hospital stay. Intubated 12/13. Extubated 12/14. DC to SNF 12/22, returned 12/24 with c diff.   OT comments  Pt motivated and with good participation.  He would like to work with theraband on bil UE exercises.  LUE 3+/5; RUE 4/5. Will plan to start with orange.  Demonstrated exercises in room without band--will provide written HEP.  Pt does well once up; sit to stand remains difficult  Follow Up Recommendations  SNF    Equipment Recommendations  3 in 1 bedside comode    Recommendations for Other Services      Precautions / Restrictions Precautions Precautions: Fall Precaution Comments: abd surgery, c diff, flexiseal,  Restrictions Weight Bearing Restrictions: No       Mobility Bed Mobility               General bed mobility comments: oob  Transfers   Equipment used: Rolling walker (2 wheeled) Transfers: Sit to/from Stand Sit to Stand: Mod assist;+2 physical assistance;+2 safety/equipment;From elevated surface         General transfer comment: assist to rise and stabilize.  Performed 2xs    Balance                                   ADL       Grooming: Oral care;Min guard;Standing                       Toileting- Clothing Manipulation and Hygiene: Sit to/from stand;Total assistance;+2 for physical assistance         General ADL Comments: assisted pt cleaning up due to leaking around flexiseal; applied barrier cream and repositioned in chair with pillow under buttocks for comfort      Vision                     Perception     Praxis      Cognition   Behavior During  Therapy: [facility] for tasks assessed/performed Overall Cognitive Status: Within Functional Limits for tasks assessed                       Extremity/Trunk Assessment               Exercises  see comments at top of page; will provide HEP today   Shoulder Instructions       General Comments      Pertinent Vitals/ Pain       Pain Assessment: Faces Faces Pain Scale: Hurts little more Pain Location: buttocks Pain Descriptors / Indicators: Aching Pain Intervention(s): Limited activity within patient's tolerance;Monitored during session;Repositioned  Home Living                                          Prior Functioning/Environment              Frequency Min 2X/week     Progress Toward Goals  OT Goals(current goals can now be found in  the care plan section)  Progress towards OT goals: Progressing toward goals  Acute Rehab OT Goals Time For Goal Achievement: 07/21/15 ADL Goals Additional ADL Goal #1: pt will perform bil UE theraband program with level 2 and written program at supervision level  Plan Discharge plan remains appropriate    Co-evaluation    PT/OT/SLP Co-Evaluation/Treatment: Yes Reason for Co-Treatment: For patient/therapist safety PT goals addressed during session: Mobility/safety with mobility OT goals addressed during session: ADL's and self-care      End of Session     Activity Tolerance Patient tolerated treatment well;No increased pain   Patient Left in chair;with call bell/phone within reach;with family/visitor present   Nurse Communication          Time: [id number] OT Time Calculation (min): 30 min  Charges: OT General Charges $OT Visit: 1 Procedure OT Treatments $Self Care/Home Management : 8-22 mins  Jeremy Jennings 07/13/2015, 12:50 PM  Lesle Chris, OTR/L [phone] 07/13/2015

## 2015-07-13 NOTE — Progress Notes (Signed)
CRITICAL VALUE ALERT  Critical value received:  Calcium 6.0  Date of notification:  [id number]  Time of notification:  0620  Critical value read back:unk  Nurse who received alert:  Sonia Side  MD notified (1st page): [place]  Time of first page:  0545  MD notified (2nd page):[place]  Time of second page:0605  Responding MD:  [place]  Time MD responded:  [phone]

## 2015-07-13 NOTE — NC FL2 (Signed)
[place] LEVEL OF CARE SCREENING TOOL     IDENTIFICATION  Patient Name: Jeremy Jennings Birthdate: 11-14-1935 Sex: male Admission Date (Current Location): 07/04/2015  [employer] and [place] Number:  Herbalist and Address:  [facility],  [place] [address] [place]      Provider Number: [id number]  Attending Physician Name and Address:  Louellen Molder, MD  Relative Name and Phone Number:       Current Level of Care: Hospital Recommended Level of Care: [place] Prior Approval Number:    Date Approved/Denied:   PASRR Number: [id number]  Discharge Plan: SNF    Current Diagnoses: Patient Active Problem List   Diagnosis Date Noted  . Hypokalemia   . Hypomagnesemia   . Hypophosphatemia   . Acute diastolic (congestive) heart failure ([place])   . C. difficile colitis   . Acute renal failure ([place])   . Elevated troponin I level   . Sepsis ([place]) 07/05/2015  . Septic shock ([place]) 07/05/2015  . Aspiration of vomitus   . Cardiac arrest due to respiratory disorder ([place]) 06/24/2015  . 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 Acute diastolic CHF (congestive heart failure), NYHA class 4 ([place]) 06/23/2015  . Acute respiratory failure ([place])   . Enterococcus UTI   . HCAP (healthcare-associated pneumonia) 06/19/2015  . Sinus tachycardia ([place]) 06/19/2015  . Pedal edema 06/19/2015  . Generalized weakness 06/19/2015  . Metabolic acidosis 0000000  . Type 2 diabetes mellitus ([place]) 06/19/2015  . SBO (small bowel obstruction) ischemic s/p LOA & SB resection 06/12/2015 06/11/2015  . Acute GI bleeding 05/27/2015  . GI bleed 05/27/2015  . Low hemoglobin 12/10/2014  . Trigger finger of both hands 12/10/2014  . Anemia 11/09/2014  . Diabetes ([place]) 04/21/2014  . Hypotestosteronism 04/21/2014  . Cancer ([place]) 04/21/2014  . Prostate cancer ([place]) 11/20/2013  . Elevated PSA 08/29/2013  . Chronic kidney disease,  stage II (mild) 08/08/2013  . BPH associated with nocturia 07/24/2013  . Benign hypertensive heart disease without heart failure 07/24/2013  . PAC (premature atrial contraction) 07/24/2012  . Bursitis of left shoulder 07/15/2011  . Hyperlipidemia   . Hypertension     Orientation RESPIRATION BLADDER Height & Weight    Self, Time, Situation, Place  Normal (Room air) Continent 6' (182.9 cm) 202 lbs.  BEHAVIORAL SYMPTOMS/MOOD NEUROLOGICAL BOWEL NUTRITION STATUS  Other (Comment) (No Behaviors)   Continent Diet (Carb modified)  AMBULATORY STATUS COMMUNICATION OF NEEDS Skin   Limited Assist Verbally Surgical wounds                       Personal Care Assistance Level of Assistance  Bathing, Feeding, Dressing Bathing Assistance: Limited assistance Feeding assistance: Limited assistance Dressing Assistance: Limited assistance     Functional Limitations Info  Sight, Hearing, Speech Sight Info: Adequate Hearing Info: Adequate Speech Info: Adequate    SPECIAL CARE FACTORS FREQUENCY  PT (By licensed PT), OT (By licensed OT)     PT Frequency: 5 X weekly OT Frequency: 5 X weekly            Contractures Contractures Info: Not present    Additional Factors Info  Code Status, Isolation Precautions, Insulin Sliding Scale Code Status Info: FULL Allergies Info: No Known Allergies   Insulin Sliding Scale Info: 0-5 units daily at bedtime; 0-9 units 3 X daily with meals  Isolation Precautions Info: Enteric precautions; UV disinfection      Current  Medications (07/13/2015):  This is the current hospital active medication list Current Facility-Administered Medications  Medication Dose Route Frequency Provider Last Rate Last Dose  . acetaminophen (TYLENOL) tablet 650 mg  650 mg Oral Q4H PRN Luella Cook, MD      . antiseptic oral rinse (CPC / CETYLPYRIDINIUM CHLORIDE 0.05%) solution 7 mL  7 mL Mouth Rinse q12n4p Javier Glazier, MD   7 mL at 07/13/15 1257  . atorvastatin  (LIPITOR) tablet 10 mg  10 mg Oral q1800 Javier Glazier, MD   10 mg at 07/12/15 1800  . brimonidine (ALPHAGAN) 0.2 % ophthalmic solution 1 drop  1 drop Right Eye Daily Theodis Blaze, MD   1 drop at 07/13/15 1057   And  . timolol (TIMOPTIC) 0.5 % ophthalmic solution 1 drop  1 drop Right Eye Daily Theodis Blaze, MD   1 drop at 07/13/15 1057  . carvedilol (COREG) tablet 12.5 mg  12.5 mg Oral BID WC Erma Heritage, PA   12.5 mg at 07/13/15 1057  . chlorhexidine (PERIDEX) 0.12 % solution 15 mL  15 mL Mouth Rinse BID Javier Glazier, MD   15 mL at 07/13/15 1057  . furosemide (LASIX) injection 40 mg  40 mg Intravenous BID Satira Sark, MD   40 mg at 07/13/15 [id number]  . heparin injection 5,000 Units  5,000 Units Subcutaneous 3 times per day Javier Glazier, MD   5,000 Units at 07/13/15 0600  . insulin aspart (novoLOG) injection 0-5 Units  0-5 Units Subcutaneous QHS Javier Glazier, MD   2 Units at 07/11/15 2200  . insulin aspart (novoLOG) injection 0-9 Units  0-9 Units Subcutaneous TID WC Javier Glazier, MD   2 Units at 07/13/15 1256  . insulin detemir (LEVEMIR) injection 6 Units  6 Units Subcutaneous QHS Theodis Blaze, MD   6 Units at 07/12/15 2223  . lactated ringers infusion   Intravenous Continuous Javier Glazier, MD   Stopped at 07/07/15 2300  . lactose free nutrition (BOOST PLUS) liquid 237 mL  237 mL Oral BID BM Satira Anis Ward, RD   237 mL at 07/13/15 1058  . magnesium oxide (MAG-OX) tablet 400 mg  400 mg Oral Daily Rhonda G Barrett, PA-C   400 mg at 07/13/15 1057  . metroNIDAZOLE (FLAGYL) tablet 500 mg  500 mg Oral 3 times per day Javier Glazier, MD   500 mg at 07/13/15 0600  . ondansetron (ZOFRAN) injection 4 mg  4 mg Intravenous Q6H PRN Luella Cook, MD      . phenol (CHLORASEPTIC) mouth spray 1 spray  1 spray Mouth/Throat PRN Praveen Mannam, MD      . phosphorus (K PHOS NEUTRAL) tablet 500 mg  500 mg Oral BID Nishant Dhungel, MD   500 mg at 07/13/15 1057  . potassium  chloride SA (K-DUR,KLOR-CON) CR tablet 40 mEq  40 mEq Oral TID Nishant Dhungel, MD   40 mEq at 07/13/15 1057  . prednisoLONE acetate (PRED FORTE) 1 % ophthalmic suspension 1 drop  1 drop Both Eyes BID Javier Glazier, MD   1 drop at 07/13/15 1057  . saccharomyces boulardii (FLORASTOR) capsule 250 mg  250 mg Oral BID Praveen Mannam, MD   250 mg at 07/13/15 1058  . sodium chloride 0.9 % injection 10-40 mL  10-40 mL Intracatheter PRN Theodis Blaze, MD   20 mL at 07/11/15 1740  . tamsulosin (FLOMAX) capsule 0.4 mg  0.4  mg Oral Daily Javier Glazier, MD   0.4 mg at 07/13/15 1057  . vancomycin (VANCOCIN) 50 mg/mL oral solution 500 mg  500 mg Oral 4 times per day Marshell Garfinkel, MD   500 mg at 07/13/15 1256     Discharge Medications: Please see discharge summary for a list of discharge medications.  Relevant Imaging Results:  Relevant Lab Results:   Additional Information SSN: [id number]  Linna Darner, LCSW

## 2015-07-14 LAB — GLUCOSE, CAPILLARY
GLUCOSE-CAPILLARY: 130 mg/dL — AB (ref 65–99)
Glucose-Capillary: 128 mg/dL — ABNORMAL HIGH (ref 65–99)
Glucose-Capillary: 151 mg/dL — ABNORMAL HIGH (ref 65–99)
Glucose-Capillary: 154 mg/dL — ABNORMAL HIGH (ref 65–99)

## 2015-07-14 LAB — BASIC METABOLIC PANEL
Anion gap: 9 (ref 5–15)
BUN: 6 mg/dL (ref 6–20)
CHLORIDE: 114 mmol/L — AB (ref 101–111)
CO2: 23 mmol/L (ref 22–32)
CREATININE: 0.8 mg/dL (ref 0.61–1.24)
Calcium: 5.9 mg/dL — CL (ref 8.9–10.3)
GFR calc Af Amer: >60 mL/min (ref >60)
GFR calc non Af Amer: >60 mL/min (ref >60)
Glucose, Bld: 152 mg/dL — ABNORMAL HIGH (ref 65–99)
Potassium: 4.1 mmol/L (ref 3.5–5.1)
SODIUM: 146 mmol/L — AB (ref 135–145)

## 2015-07-14 LAB — MAGNESIUM: Magnesium: 1.8 mg/dL (ref 1.7–2.4)

## 2015-07-14 MED ORDER — MAGNESIUM SULFATE IN D5W 10-5 MG/ML-% IV SOLN
1.0000 g | Freq: Once | INTRAVENOUS | Status: AC
  Administered 2015-07-14: 1 g via INTRAVENOUS
  Filled 2015-07-14: qty 100

## 2015-07-14 MED ORDER — CALCIUM CARBONATE ANTACID 500 MG PO CHEW
1.0000 | CHEWABLE_TABLET | Freq: Two times a day (BID) | ORAL | Status: DC
  Administered 2015-07-14 (×2): 200 mg via ORAL
  Filled 2015-07-14 (×4): qty 1

## 2015-07-14 NOTE — Progress Notes (Signed)
[facility] PROGRESS NOTE  Jeremy Jennings [id number] DOB: 1935-07-13 DOA: 07/04/2015 PCP: [name], MD  Brief narrative 80 year old nursing home resident male with history of chronic diastolic CHF and type 2 diabetes mellitus presenting with fever and diarrhea. He was recently hospitalized for small bowel obstruction requiring exploratory laparotomy with small bowel resection which was complicated by a brief episode of cardiac arrest. He also had UTI and was treated with antibiotics and discharged on 12/23. Patient on presentation had septic shock secondary to C. difficile colitis and was admitted to ICU. Transferred care to hospitalist service on 12/28.  Assessment/Plan: Principal problem Septic shock secondary to severe C. difficile colitis Oral vancomycin and Flagyl (day 10/14). Still having diarrhea.  Acute hypoxic respiratory failure Secondary to acute on chronic diastolic CHF. Patient was initially hypovolemic requiring IV fluids. Now volume overloaded.  on scheduled Lasix and diuresing well.  Acute on chronic diastolic CHF On scheduled IV Lasix. Almost 20 pound weight gain since admission. Monitor strict I/O and daily weight. (Still 5 lb more than admission weight) . Diuresing well . Cardiology following. Continue Coreg. Recent 2-D echo with normal EF and no wall motion abnormality.   hypokalemia/hypocalcemia/hypomagnesemia/hypophosphatemia . Replenishing aggressively. Added calcium carbonate.  Recent small bowel obstruction. Status post exploratory laparotomy and small bowel resection. Follow-up with surgery as outpatient.  Anemia of chronic disease Hemoglobin low but stable  Type 2 diabetes mellitus Continue sliding scale coverage. Added pre-meal aspart  Protein -calorie malnutrition Added supplement.  Was taking Megace for appetite examination as outpatient  DVT prophylaxis  Diet: Heart healthy  Code Status: Full code Family Communication: Daughter  at bedside Disposition Plan: Skilled nursing facility in the next 2-3 days once diuresed adequately   Consultants:  Cardiology  Procedures:  CT chest with contrast  Antibiotics:  Oral Vancomycin and flagyl day 10/14  HPI/Subjective: Seen and examined. Still Having diarrhea.  Objective: Filed Vitals:   07/13/15 2138 07/14/15 0613  BP: 124/59 134/57  Pulse: 97 98  Temp: 98.5 F (36.9 C) 98.8 F (37.1 C)  Resp: 19 19    Intake/Output Summary (Last 24 hours) at 07/14/15 1400 Last data filed at 07/14/15 [id number]  Gross per 24 hour  Intake      0 ml  Output   2100 ml  Net  -2100 ml   Filed Weights   07/09/15 0600 07/10/15 0500 07/13/15 0800  Weight: 97.6 kg (215 lb 2.7 oz) 98.022 kg (216 lb 1.6 oz) 91.853 kg (202 lb 8 oz)    Exam:   General:   not in distress  HEENT: moist mucosa  Chest: clear b/l  CVS: Normal S1 and S2, no murmurs rub or gallop  GI: Soft, nondistended, nontender, bowel sounds present, swollen penis and scrotum improved , rectal tube in place, foley+  Musculoskeletal: 1+ pitting edema bilaterally ( improving)  CNS: Alert and oriented  Data Reviewed: Basic Metabolic Panel:  Recent Labs Lab 07/08/15 0515 07/09/15 0600 07/10/15 0500 07/11/15 0630 07/12/15 0645 07/13/15 0410 07/14/15 0535  NA 147* 145 146* 145 146* 147* 146*  K 3.0* 3.1* 3.2* 3.1* 3.2* 3.2* 4.1  CL 120* 115* 115* 116* 113* 116* 114*  CO2 15* 17* 18* 20* 22 24 23   GLUCOSE 168* 186* 192* 174* 164* 129* 152*  BUN 26* 20 15 11 8 7 6   CREATININE 1.09 1.06 0.95 0.85 0.74 0.73 0.80  CALCIUM 5.9* 5.7* 6.0* 5.8* 6.0* 6.0* 5.9*  MG 2.4 2.1 1.7 1.5* 1.7 1.6* 1.8  PHOS  1.7* 1.3* 1.2* 1.2*  --  1.9*  --    Liver Function Tests:  Recent Labs Lab 07/08/15 0515 07/09/15 0600 07/10/15 0500 07/11/15 0630  ALBUMIN 1.8* 1.7* 1.7* 1.6*   No results for input(s): LIPASE, AMYLASE in the last 168 hours. No results for input(s): AMMONIA in the last 168 hours. CBC:  Recent  Labs Lab 07/09/15 0600 07/10/15 0500 07/11/15 0630 07/12/15 0645 07/13/15 0410  WBC 9.7 9.5 10.7* 11.2* 10.9*  NEUTROABS 7.1 7.2 8.4* 9.1* 8.9*  HGB 8.7* 8.3* 7.9* 7.9* 8.0*  HCT 26.5* 24.8* 23.7* 23.4* 24.2*  MCV 86.6 86.1 86.2 85.7 86.1  PLT 184 160 148* 135* 154   Cardiac Enzymes: No results for input(s): CKTOTAL, CKMB, CKMBINDEX, TROPONINI in the last 168 hours. BNP (last 3 results)  Recent Labs  06/19/15 1120 06/23/15 0530 07/05/15 0955  BNP 258.8* 147.8* 348.0*    ProBNP (last 3 results) No results for input(s): PROBNP in the last 8760 hours.  CBG:  Recent Labs Lab 07/13/15 1241 07/13/15 1649 07/13/15 2123 07/14/15 0754 07/14/15 1214  GLUCAP 167* 132* 161* 128* 151*    Recent Results (from the past 240 hour(s))  Blood Culture (routine x 2)     Status: None   Collection Time: 07/04/15 11:25 PM  Result Value Ref Range Status   Specimen Description BLOOD LEFT ANTECUBITAL  Final   Special Requests IN PEDIATRIC BOTTLE 3ML  Final   Culture   Final    NO GROWTH 5 DAYS Performed at [facility]    Report Status 07/10/2015 FINAL  Final  Blood Culture (routine x 2)     Status: None   Collection Time: 07/04/15 11:25 PM  Result Value Ref Range Status   Specimen Description BLOOD RIGHT ANTECUBITAL  Final   Special Requests BOTTLES DRAWN AEROBIC AND ANAEROBIC 5ML  Final   Culture   Final    NO GROWTH 5 DAYS Performed at [facility]    Report Status 07/10/2015 FINAL  Final  C difficile quick scan w PCR reflex     Status: Abnormal   Collection Time: 07/05/15  2:34 AM  Result Value Ref Range Status   C Diff antigen POSITIVE (A) NEGATIVE Final   C Diff toxin POSITIVE (A) NEGATIVE Final   C Diff interpretation Positive for toxigenic C. difficile  Final    Comment: CRITICAL RESULT CALLED TO, READ BACK BY AND VERIFIED WITH: MARQUEZ,A/ED @0341  ON 07/05/15 BY KARCZEWSKI,S.   MRSA PCR Screening     Status: None   Collection Time: 07/05/15  7:00 AM   Result Value Ref Range Status   MRSA by PCR NEGATIVE NEGATIVE Final    Comment:        The GeneXpert MRSA Assay (FDA approved for NASAL specimens only), is one component of a comprehensive MRSA colonization surveillance program. It is not intended to diagnose MRSA infection nor to guide or monitor treatment for MRSA infections.   Urine culture     Status: None   Collection Time: 07/07/15  6:24 AM  Result Value Ref Range Status   Specimen Description URINE, CATHETERIZED  Final   Special Requests NONE  Final   Culture   Final    NO GROWTH 1 DAY Performed at [facility]    Report Status 07/08/2015 FINAL  Final     Studies: No results found.  Scheduled Meds: . antiseptic oral rinse  7 mL Mouth Rinse q12n4p  . atorvastatin  10 mg Oral q1800  .  brimonidine  1 drop Right Eye Daily   And  . timolol  1 drop Right Eye Daily  . calcium carbonate  1 tablet Oral BID  . carvedilol  12.5 mg Oral BID WC  . chlorhexidine  15 mL Mouth Rinse BID  . furosemide  40 mg Intravenous BID  . heparin subcutaneous  5,000 Units Subcutaneous 3 times per day  . insulin aspart  0-5 Units Subcutaneous QHS  . insulin aspart  0-9 Units Subcutaneous TID WC  . insulin detemir  6 Units Subcutaneous QHS  . lactose free nutrition  237 mL Oral BID BM  . magnesium oxide  400 mg Oral Daily  . metroNIDAZOLE  500 mg Oral 3 times per day  . phosphorus  500 mg Oral BID  . potassium chloride  40 mEq Oral TID  . prednisoLONE acetate  1 drop Both Eyes BID  . saccharomyces boulardii  250 mg Oral BID  . tamsulosin  0.4 mg Oral Daily  . vancomycin  500 mg Oral 4 times per day   Continuous Infusions:     Time spent: 25 minutes    Louellen Molder  [facility] Pager [phone] If 7PM-7AM, please contact night-coverage at [email or web address], password [facility] 07/14/2015, 2:00 PM  LOS: 9 days

## 2015-07-14 NOTE — Progress Notes (Signed)
Patient Profile: 80 y.o. male with PMH of HTN, HLD, Type 2 DM, and Stage 4 Prostate Cancer recently admitted from 06/11/2015 to 07/03/2015 for a small bowel obstruction. Represented on 07/04/2015 with Septic Shock secondary to severe C.diff Colitis. Cardiology consulted for elevated troponin values.  Subjective: No chest pain no SOb  Objective: Vital signs in last 24 hours: Temp:  [98.5 F (36.9 C)-98.8 F (37.1 C)] 98.8 F (37.1 C) (01/03 0613) Pulse Rate:  [97-98] 98 (01/03 0613) Resp:  [19-20] 19 (01/03 0613) BP: (110-134)/(54-59) 134/57 mmHg (01/03 0613) SpO2:  [97 %-100 %] 97 % (01/03 [id number]) Weight change:  Last BM Date: 07/13/15 Intake/Output from previous day: 01/02 0701 - 01/03 0700 In: -  Out: 3000 [Urine:2600; Stool:400] Intake/Output this shift:    PE: General:Pleasant affect, NAD Skin:Warm and dry, brisk capillary refill HEENT:normocephalic, sclera clear, mucus membranes moist Heart:S1S2 RRR without murmur, gallup, rub or click Lungs:clear without rales, rhonchi, or wheezes [id number], non tender, + BS, do not palpate liver spleen or masses- rectal tube draining. Ext:1-2+ lower ext edema,  2+ radial pulses Neuro:alert and oriented X 3, MAE, follows commands, + facial symmetry Tele:  SR to mild ST less ectopy   Lab Results:  Recent Labs  07/12/15 0645 07/13/15 0410  WBC 11.2* 10.9*  HGB 7.9* 8.0*  HCT 23.4* 24.2*  PLT 135* 154   BMET  Recent Labs  07/13/15 0410 07/14/15 0535  NA 147* 146*  K 3.2* 4.1  CL 116* 114*  CO2 24 23  GLUCOSE 129* 152*  BUN 7 6  CREATININE 0.73 0.80  CALCIUM 6.0* 5.9*   No results for input(s): TROPONINI in the last 72 hours.  Invalid input(s): CK, MB  Lab Results  Component Value Date   CHOL 167 12/10/2014   HDL 37.50* 12/10/2014   LDLCALC 108* 12/10/2014   TRIG 161* 06/29/2015   CHOLHDL 4 12/10/2014   Lab Results  Component Value Date   HGBA1C 6.3* 05/28/2015     Lab Results  Component Value  Date   TSH 2.837 05/28/2015      Studies/Results: No results found.  Medications: I have reviewed the patient's current medications. Scheduled Meds: . antiseptic oral rinse  7 mL Mouth Rinse q12n4p  . atorvastatin  10 mg Oral q1800  . brimonidine  1 drop Right Eye Daily   And  . timolol  1 drop Right Eye Daily  . calcium carbonate  1 tablet Oral BID  . carvedilol  12.5 mg Oral BID WC  . chlorhexidine  15 mL Mouth Rinse BID  . furosemide  40 mg Intravenous BID  . heparin subcutaneous  5,000 Units Subcutaneous 3 times per day  . insulin aspart  0-5 Units Subcutaneous QHS  . insulin aspart  0-9 Units Subcutaneous TID WC  . insulin detemir  6 Units Subcutaneous QHS  . lactose free nutrition  237 mL Oral BID BM  . magnesium oxide  400 mg Oral Daily  . magnesium sulfate 1 - 4 g bolus IVPB  1 g Intravenous Once  . metroNIDAZOLE  500 mg Oral 3 times per day  . phosphorus  500 mg Oral BID  . potassium chloride  40 mEq Oral TID  . prednisoLONE acetate  1 drop Both Eyes BID  . saccharomyces boulardii  250 mg Oral BID  . tamsulosin  0.4 mg Oral Daily  . vancomycin  500 mg Oral 4 times per day   Continuous Infusions: .  lactated ringers Stopped (07/07/15 2300)   PRN Meds:.acetaminophen, ondansetron (ZOFRAN) IV, phenol, sodium chloride  Assessment/Plan: Principal Problem:   Septic shock (HCC) Active Problems:   Sepsis (HCC)   C. difficile colitis   Acute renal failure (HCC)   Elevated troponin I level   Acute diastolic (congestive) heart failure (HCC)   Hypokalemia   Hypomagnesemia   Hypophosphatemia  1. Elevated Troponin - troponin peak 0.16 in the setting of septic shock secondary to C.diff colitis. - felt demand ischemia in the setting of his acute illness. - continues to deny any anginal symptoms. EKG shows no acute ischemic changes. - no invasive workup at this time.  2. Acute on Chronic Diastolic CHF - EF Q000111Q on recent echo with no wall motion abnormalities. - Is  +2.6L--(now [id number]) this admission due to need for hydration in setting of septic shock. - Continue with daily weights and strict I&O's. Creatinine stable despite diuresis.  - Continue IV diuresis - goal weight <200 lbs. wt yesterday 202.8  3. HTN - currently well-controlled.  4. Sinus tachycardia with Frequent PAC's, PVCs - HR stable - PVC's are less frequent  5. Septic Shock secondary to Severe C.diff Colitis - resolved  6. Hypokalemia - 4.1 today -continue to replete with diuresis  7. Hypomagnesemia - improved but still w/ frequent vent ectopy - supplement and follow--today 1.8  8. Anemia - defer to medicine service- seems stable at around 8/24.    LOS: 9 days   Time spent with pt. :15 minutes. [facility] R  Nurse Practitioner Certified Pager [phone] or after 5pm and on weekends call [phone] 07/14/2015, 8:17 AM

## 2015-07-14 NOTE — Clinical Social Work Note (Signed)
CSW spoke with Marden Noble in admissions from [facility]. [facility] will accept pt only if his vancomycin is decreased or discontinued and replaced with another antibiotic. CSW paged attending (Dr. Miguel Rota) to see if this was an option. CSW will continue to follow and send updates to [facility].    Cindra Presume, LCSW [phone] Hospital psychiatric & 5E, 5W [phone] Licensed Clinical Social Worker

## 2015-07-14 NOTE — Care Management Important Message (Signed)
Important Message  Patient D IM Letter given to Rhonda/Case Manager to present to [place] Message  Patient Details  Name: CHAUNCEY AHLGREN MRN: [id number] Date of Birth: 1936/06/15   Medicare Important Message Given:  Yes    Camillo Flaming 07/14/2015, 2:47 PM Name: SAHAN KIDDER MRN: [id number] Date of Birth: Apr 28, 1936   Medicare Important Message Given:  Yes    Camillo Flaming 07/14/2015, 2:46 PM

## 2015-07-14 NOTE — Telephone Encounter (Signed)
Pt currently in the hospital 

## 2015-07-15 DIAGNOSIS — J9601 Acute respiratory failure with hypoxia: Secondary | ICD-10-CM

## 2015-07-15 LAB — PHOSPHORUS: PHOSPHORUS: 2 mg/dL — AB (ref 2.5–4.6)

## 2015-07-15 LAB — BASIC METABOLIC PANEL
ANION GAP: 9 (ref 5–15)
BUN: 6 mg/dL (ref 6–20)
CALCIUM: 5.8 mg/dL — AB (ref 8.9–10.3)
CO2: 22 mmol/L (ref 22–32)
CREATININE: 0.73 mg/dL (ref 0.61–1.24)
Chloride: 114 mmol/L — ABNORMAL HIGH (ref 101–111)
Glucose, Bld: 72 mg/dL (ref 65–99)
Potassium: 4 mmol/L (ref 3.5–5.1)
SODIUM: 145 mmol/L (ref 135–145)

## 2015-07-15 LAB — GLUCOSE, CAPILLARY
GLUCOSE-CAPILLARY: 70 mg/dL (ref 65–99)
GLUCOSE-CAPILLARY: 114 mg/dL — AB (ref 65–99)
GLUCOSE-CAPILLARY: 99 mg/dL (ref 65–99)
Glucose-Capillary: 141 mg/dL — ABNORMAL HIGH (ref 65–99)

## 2015-07-15 LAB — MAGNESIUM: MAGNESIUM: 1.7 mg/dL (ref 1.7–2.4)

## 2015-07-15 MED ORDER — ENSURE ENLIVE PO LIQD
237.0000 mL | Freq: Two times a day (BID) | ORAL | Status: DC
  Administered 2015-07-16 – 2015-07-17 (×4): 237 mL via ORAL

## 2015-07-15 MED ORDER — CALCIUM GLUCONATE 10 % IV SOLN
2.0000 g | Freq: Once | INTRAVENOUS | Status: AC
  Administered 2015-07-15: 2 g via INTRAVENOUS
  Filled 2015-07-15: qty 20

## 2015-07-15 MED ORDER — CALCIUM CITRATE-VITAMIN D 500-400 MG-UNIT PO CHEW
2.0000 | CHEWABLE_TABLET | Freq: Three times a day (TID) | ORAL | Status: DC
  Administered 2015-07-15 – 2015-07-17 (×9): 2 via ORAL
  Filled 2015-07-15 (×12): qty 2

## 2015-07-15 MED ORDER — SODIUM PHOSPHATE 3 MMOLE/ML IV SOLN
15.0000 mmol | Freq: Once | INTRAVENOUS | Status: AC
  Administered 2015-07-15: 15 mmol via INTRAVENOUS
  Filled 2015-07-15: qty 5

## 2015-07-15 MED ORDER — DEXTROSE 5 % IV SOLN
3.0000 g | Freq: Once | INTRAVENOUS | Status: AC
  Administered 2015-07-15: 3 g via INTRAVENOUS
  Filled 2015-07-15: qty 6

## 2015-07-15 MED ORDER — FUROSEMIDE 40 MG PO TABS
40.0000 mg | ORAL_TABLET | Freq: Two times a day (BID) | ORAL | Status: DC
  Administered 2015-07-15 – 2015-07-17 (×4): 40 mg via ORAL
  Filled 2015-07-15 (×7): qty 1

## 2015-07-15 NOTE — Progress Notes (Signed)
Nutrition Follow-up  DOCUMENTATION CODES:   Not applicable  INTERVENTION:  - Will change Boost Plus to Ensure Enlive BID, each supplement provides 350 kcal and 20 grams of protein - Encourage PO intakes of meals and supplements - RD will continue to monitor for needs  NUTRITION DIAGNOSIS:   Inadequate oral intake related to inability to eat as evidenced by NPO status. -diet order now in place, continues with inadequate oral intake  GOAL:   Patient will meet greater than or equal to 90% of their needs -unmet  MONITOR:   PO intake, Supplement acceptance, Weight trends, I & O's, Labs, Skin  ASSESSMENT:   Jeremy Jennings is a 80 y.o. male with PMHx listed below who presents from NH with complaints of fever and diarrhea. He was recently hospitalized for SBO requiring ex-lap complicated by brief cardiac arrest as well as UTI on vanc/Zosyn. He was discharged yesterday.  At the rehab facility, he was ambulating around without walker for short distances, talking and conversational, and close to his previous baseline (he lives independently and still drives). This morning and afternoon, per pts daughter, there was a clinical change where he was lethargic and febrile with abominal pain and distension. He was subsequently sent from SNF to the hospital where he was noted to have a T of 105F, tachycardic, tachycpneic and with initially soft blood pressure and hypoxic. The patient had four watery BMs while in the ED, and was found to be c.diff +.He received 4L IVF + antibiotics.  1/4 Per chart review, pt ate 50% of lunch and 10% of dinner 12/31 and 100% of dinner on 1/2; no other intakes recorded since last assessment. Pt reports ongoing poor appetite; he states that Megace PTA slightly helped. MD aware of pt taking Megace PTA. He denies abdominal pain or nausea associated with PO intakes. Pt states that all drinks and many foods taste the same and that he feels this may be a taste alteration  related to medications he is currently taking.  Pt has been variably drinking Boost Plus. He had not had one as of RD visit earlier this afternoon but planned to drink one later today. Talked with pt about switching to Ensure [facility] and he is open to this change; this will provide more flavor options as well as 6 more grams of protein/bottle than Boost Plus. Weight down 22 lbs (10% body weight) since 07/06/15; Lasix order in place but will need to continue to monitor weight trends.   Encouraged pt to continue to try to eat something at each meal time. He states that he attempted to eat a [employer] croissant sandwhich earlier in the day but has had no other intakes of food today. Not meeting needs. Medications reviewed. Labs reviewed; CBGs: 70-167 mg/dL, Cl: 114 mmol/L, Ca: 5.8 mg/dL, Phos: 2 mg/dL.   12/28 - Pt found to be C Diff+ and has had multiple watery bowel movements since admission.  - He initially had septic shock and was on multiple pressors, that has since resolved. - Discussed drinking liquids to help manage calories, pt agreed to Boost Plus similar to previous admit.  - Pt reports to be eating ok, no PO intake in chart at this point in time. - Per MD note, he is to restart some of his home meds today.  12/26 - Pt is well known to RD, from previous stay.  - Current fever, C-Diff + has hurt pt's appetite. - Currently NPO, awaiting MD workup. - Nutrition-Focused physical exam  completed.  - Findings are no fat depletion, no muscle depletion, and mild edema.   Diet Order:  Diet Heart Room service appropriate?: Yes; Fluid consistency:: Thin; Fluid restriction:: 1500 mL Fluid  Skin:  Wound (see comment) (Closed incision to abdomen.)  Last BM:  1/3  Height:   Ht Readings from Last 1 Encounters:  07/08/15 6' (1.829 m)    Weight:   Wt Readings from Last 1 Encounters:  07/15/15 192 lb 8 oz (87.317 kg)    Ideal Body Weight:  80.91 kg  BMI:  Body mass index is 26.1  kg/(m^2).  Estimated Nutritional Needs:   Kcal:  2000-2200  Protein:  100-110g  Fluid:  2L/day  EDUCATION NEEDS:   No education needs identified at this time     Jarome Matin, RD, LDN Inpatient Clinical Dietitian Pager # [phone] After hours/weekend pager # [phone]

## 2015-07-15 NOTE — Progress Notes (Signed)
Patient Profile: 80 y.o. male with PMH of HTN, HLD, Type 2 DM, and Stage 4 Prostate Cancer recently admitted from 06/11/2015 to 07/03/2015 for a small bowel obstruction. Represented on 07/04/2015 with Septic Shock secondary to severe C.diff Colitis. Cardiology consulted for elevated troponin values.   Subjective: No complaints, slowly improving.  Objective: Vital signs in last 24 hours: Temp:  [97.7 F (36.5 C)-98.5 F (36.9 C)] 97.7 F (36.5 C) (01/04 0556) Pulse Rate:  [79-100] 79 (01/04 0556) Resp:  [19-20] 19 (01/04 0556) BP: (103-130)/(61-80) 130/70 mmHg (01/04 0556) SpO2:  [97 %-100 %] 100 % (01/04 0556) Weight change:  Last BM Date: 07/14/15 Intake/Output from previous day: -2050  01/03 0701 - 01/04 0700 In: -  Out: 2050 [Urine:2050] Intake/Output this shift: Total I/O In: -  Out: 350 [Urine:350]  PE: General:Pleasant affect, NAD Skin:Warm and dry, brisk capillary refill HEENT:normocephalic, sclera clear, mucus membranes moist Heart:S1S2 RRR without murmur, gallup, rub or click Lungs:clear without rales, rhonchi, or wheezes [id number], non tender, + BS, do not palpate liver spleen or masses Ext:no to tr lower ext edema improved,  2+ radial pulses Neuro:alert and oriented X 3, MAE, follows commands, + facial symmetry Tele: SR to ST 90s mostly   Lab Results:  Recent Labs  07/13/15 0410  WBC 10.9*  HGB 8.0*  HCT 24.2*  PLT 154   BMET  Recent Labs  07/14/15 0535 07/15/15 0550  NA 146* 145  K 4.1 4.0  CL 114* 114*  CO2 23 22  GLUCOSE 152* 72  BUN 6 6  CREATININE 0.80 0.73  CALCIUM 5.9* 5.8*   No results for input(s): TROPONINI in the last 72 hours.  Invalid input(s): CK, MB  Lab Results  Component Value Date   CHOL 167 12/10/2014   HDL 37.50* 12/10/2014   LDLCALC 108* 12/10/2014   TRIG 161* 06/29/2015   CHOLHDL 4 12/10/2014   Lab Results  Component Value Date   HGBA1C 6.3* 05/28/2015     Lab Results  Component Value Date     TSH 2.837 05/28/2015      Studies/Results: No results found.  Medications: I have reviewed the patient's current medications. Scheduled Meds: . antiseptic oral rinse  7 mL Mouth Rinse q12n4p  . atorvastatin  10 mg Oral q1800  . brimonidine  1 drop Right Eye Daily   And  . timolol  1 drop Right Eye Daily  . calcium citrate-vitamin D  2 tablet Oral TID PC & HS  . calcium gluconate  2 g Intravenous Once  . carvedilol  12.5 mg Oral BID WC  . chlorhexidine  15 mL Mouth Rinse BID  . furosemide  40 mg Intravenous BID  . heparin subcutaneous  5,000 Units Subcutaneous 3 times per day  . insulin aspart  0-5 Units Subcutaneous QHS  . insulin aspart  0-9 Units Subcutaneous TID WC  . insulin detemir  6 Units Subcutaneous QHS  . lactose free nutrition  237 mL Oral BID BM  . magnesium oxide  400 mg Oral Daily  . magnesium sulfate 1 - 4 g bolus IVPB  3 g Intravenous Once  . metroNIDAZOLE  500 mg Oral 3 times per day  . phosphorus  500 mg Oral BID  . potassium chloride  40 mEq Oral TID  . prednisoLONE acetate  1 drop Both Eyes BID  . saccharomyces boulardii  250 mg Oral BID  . tamsulosin  0.4 mg Oral Daily  . vancomycin  500 mg Oral 4 times per day   Continuous Infusions:  PRN Meds:.acetaminophen, ondansetron (ZOFRAN) IV, phenol, sodium chloride  Assessment/Plan: Principal Problem:   Septic shock (HCC) Active Problems:   Sepsis (HCC)   C. difficile colitis   Acute renal failure (HCC)   Elevated troponin I level   Acute diastolic (congestive) heart failure (HCC)   Hypokalemia   Hypomagnesemia   Hypophosphatemia   1. Elevated Troponin - troponin peak 0.16 in the setting of septic shock secondary to C.diff colitis. - felt demand ischemia in the setting of his acute illness. - continues to deny any anginal symptoms. EKG shows no acute ischemic changes. - no invasive workup at this time.  2. Acute on Chronic Diastolic CHF - EF Q000111Q on recent echo with no wall motion  abnormalities. - was +2.6L--(now -10,947) this admission due to need for hydration in setting of septic shock. - Continue with daily weights and strict I&O's. Creatinine stable despite diuresis.  - Continue IV diuresis - goal weight <200 lbs. wt 08/13/15 202.8  Today 192  --no weights since 07/13/15 will ask nurse to weigh daily.  Now 192 lbs -change to po lasix will plan 40 mg po BID  3. HTN - currently well-controlled.  4. Sinus tachycardia with Frequent PAC's, PVCs - HR stable - PVC's are less frequent no runs of NSVT, HR mostly in the 90s  5. Septic Shock secondary to Severe C.diff Colitis - resolved  6. Hypokalemia - 4.0 today -continue to replete with diuresis  7. Hypomagnesemia - improved but still w/ frequent vent ectopy - supplement and follow--today 1.7  8. Anemia - defer to medicine service- seems stable at around 8/24.  9. Hypocalcemia  6.0-->5.9-->5.8       LOS: 10 days   Time spent with pt. :15 minutes. [facility] R  Nurse Practitioner Certified Pager [phone] or after 5pm and on weekends call [phone] 07/15/2015, 9:45 AM

## 2015-07-15 NOTE — Progress Notes (Signed)
MEDICATION RELATED CONSULT NOTE - INITIAL   Pharmacy Consult for phos replacement Indication: HYPOphosphatemia  No Known Allergies  Patient Measurements: Height: 6' (182.9 cm) Weight: 202 lb 8 oz (91.853 kg) IBW/kg (Calculated) : 77.6 Adjusted Body Weight:   Vital Signs: Temp: 97.7 F (36.5 C) (01/04 0556) Temp Source: Oral (01/04 0556) BP: 130/70 mmHg (01/04 0556) Pulse Rate: 79 (01/04 0556) Intake/Output from previous day: 01/03 0701 - 01/04 0700 In: -  Out: 2050 [Urine:2050] Intake/Output from this shift: Total I/O In: -  Out: 350 [Urine:350]  Labs:  Recent Labs  07/13/15 0410 07/14/15 0535 07/15/15 0550  WBC 10.9*  --   --   HGB 8.0*  --   --   HCT 24.2*  --   --   PLT 154  --   --   CREATININE 0.73 0.80 0.73  MG 1.6* 1.8 1.7  PHOS 1.9*  --  2.0*   Estimated Creatinine Clearance: 84.9 mL/min (by C-G formula based on Cr of 0.73).  Medical History: Past Medical History  Diagnosis Date  . Essential hypertension   . Type 2 diabetes mellitus ([place])   . Hyperlipidemia   . Prostate cancer ([place])   . History of GI bleed   . Tachycardia   . PAC (premature atrial contraction)     Frequent   Assessment: 86 YOM with recent SBO requiring ex lap with SBR.  Re-admitted with sepsis related to C. Difficile.  Electrolyte abnormalities persist.  Pharmacy asked to assist with phos replacement.    Currently on Phos-NaK 500mg  BID w/ small improvement in Phos level Also on KCl 60meq TID, mag ox, and calcium citrate  Goal of Therapy:  Replete Phos  Plan:   Since on KCl TID and K = 4, Na at ULN; give NaPhos 38mmol x 1  Sign-off  Doreene Eland, PharmD, BCPS.   Pager: [phone] 07/15/2015 9:48 AM

## 2015-07-15 NOTE — Progress Notes (Signed)
Physical Therapy Treatment Patient Details Name: Jeremy Jennings MRN: [id number] DOB: 03-28-1936 Today's Date: 07/15/2015    History of Present Illness 80 yo male who has a history of stage 4 prostate cancer, HTN, DM, tachycardia, recent GI bleed.  Dx of SBO, s/p resection 06/12/15.  Marland Kitchen Acute respiratoiry failure and cardiac arrest during hospital stay. Intubated 12/13. Extubated 12/14. DC to SNF 12/22, returned 12/24 with c diff.    PT Comments    Pt  Not feeling well overall today, "weaker"; he is willing to participate with therapy and reports feeling better afterward;  Follow Up Recommendations  SNF;Supervision/Assistance - 24 hour     Equipment Recommendations  None recommended by PT    Recommendations for Other Services       Precautions / Restrictions Precautions Precautions: Fall Precaution Comments: abd surgery, c diff, flexiseal,  Restrictions Weight Bearing Restrictions: No    Mobility  Bed Mobility Overal bed mobility: Needs Assistance Bed Mobility: Supine to Sit;Sit to Supine Rolling: Min assist Sidelying to sit: HOB elevated;Min assist   Sit to supine: Mod assist   General bed mobility comments: cues for technique, pt uses rail with cues to assist with rolling,  min assist to bring trunk to full upright; mod assist to bring LEs onto bed  Transfers Overall transfer level: Needs assistance Equipment used: Rolling walker (2 wheeled) Transfers: Sit to/from Stand Sit to Stand: Min assist;From elevated surface         General transfer comment: assist to rise, multi-modal cues for scooting to EOB, for anterior-superior wt shift  and to control descent  Ambulation/Gait             General Gait Details: pt reports feeling too weak to amb today/deferred   Stairs            Wheelchair Mobility    Modified Rankin (Stroke Patients Only)       Balance   Sitting-balance support: Feet supported;Single extremity supported Sitting balance-Leahy  Scale: Fair     Standing balance support: Bilateral upper extremity supported Standing balance-Leahy Scale: Poor                      Cognition Arousal/Alertness: Awake/alert Behavior During Therapy: WFL for tasks assessed/performed Overall Cognitive Status: Within Functional Limits for tasks assessed                      Exercises General Exercises - Lower Extremity Quad Sets: AROM;Both;10 reps Gluteal Sets: AROM;Strengthening;Both;10 reps Long Arc Quad: AROM;15 reps;Seated;Both Heel Slides: AROM;AAROM;Both;10 reps;Supine Hip Flexion/Marching: Limitations Hip Flexion/Marching Limitations: attempted, pt unable today d/t discomfort Other Exercises Other Exercises: bridging x 5 with bil LEs    General Comments        Pertinent Vitals/Pain Pain Assessment: Faces Faces Pain Scale: Hurts a little bit Pain Location: back, sacrum Pain Descriptors / Indicators: Grimacing;Discomfort Pain Intervention(s): Limited activity within patient's tolerance;Monitored during session;Repositioned    Home Living                      Prior Function            PT Goals (current goals can now be found in the care plan section) Acute Rehab PT Goals Patient Stated Goal: get back to being independent PT Goal Formulation: With patient Time For Goal Achievement: 07/21/15 Potential to Achieve Goals: Good Progress towards PT goals: Progressing toward goals    Frequency  Min 3X/week  PT Plan Current plan remains appropriate    Co-evaluation             End of Session Equipment Utilized During Treatment: Gait belt Activity Tolerance: Patient limited by fatigue Patient left: in bed;with call bell/phone within reach     Time: [id number] PT Time Calculation (min) (ACUTE ONLY): 24 min  Charges:  $Therapeutic Exercise: 8-22 mins $Therapeutic Activity: 8-22 mins                    G Codes:      Mckyla Deckman 07/23/2015, 11:40 AM

## 2015-07-15 NOTE — Progress Notes (Signed)
[facility] PROGRESS NOTE  Jeremy Jennings [id number] DOB: [birth year] DOA: 07/04/2015 PCP: [name], MD  Brief interval history  80 year old nursing home resident male with history of chronic diastolic CHF and type 2 diabetes mellitus presenting with fever and diarrhea. He was recently hospitalized for small bowel obstruction requiring exploratory laparotomy with small bowel resection which was complicated by a brief episode of cardiac arrest. He also had UTI and was treated with antibiotics and discharged on 12/23. Patient on presentation had septic shock secondary to C. difficile colitis and was admitted to ICU. Transferred care to hospitalist service on 12/28.   Assessment/Plan: #1 septic shock secondary to severe C. difficile colitis Clinical improvement. Patient is afebrile. WBC trending down. Patient still with loose stools but volume of stools decreasing per nursing. Continue current regimen of oral Flagyl or vancomycin. If volume of stools continue to decrease may consider discontinuing rectal 2 in the next 1-2 days.  #2 severe C. difficile colitis Volume of stools decreasing. Patient still would rectal pouch. Continue oral Flagyl and vancomycin. Follow. Likely treat for 21 days.  #3 acute hypoxic respiratory failure secondary to acute on chronic diastolic CHF Patient with clinical improvement. Patient diuresed and well on IV Lasix. Patient would almost a 20 pound weight gain since admission. Recent 2-D echo with normal EF with no wall motion abnormalities. Patient is -11.297 L during this hospitalization. Patient's weight goal is less than 200 pounds and patient currently at 192 pounds. Continue strict I's and O's and daily weights. Patient has been transitioned to oral Lasix per cardiology. Follow.  #4 elevated troponin Likely a demand ischemia in the setting of acute illness of sepsis secondary to C. difficile colitis. Patient currently asymptomatic. EKG with no acute  ischemic changes. No further workup at this time per cardiology.  #5 hypertension Stable. Continue current regimen.  #6 hypokalemia/ hypocalcemia/hypomagnesemia/hypophosphatemia Likely secondary to GI losses. Continue to replete electrolytes. Check a PTH. Check ionized calcium. Place on calcium citrate 500 mg plus vitamin D 2 tablets 4 times daily. We'll give a dose of IV calcium gluconate. Follow.  #7 recent small bowel obstruction Status post exploratory laparotomy with small bowel resection. Follow-up with general surgery as outpatient.  #8 type 2 diabetes Hemoglobin A1c was 6.3 on 05/28/2015. Continue Levemir. Sliding scale insulin.  #9 protein calorie malnutrition Continued nutritional supplementation.  #10 prophylaxis Heparin for DVT prophylaxis.  Code Status: Full Family Communication: Updated patient. No family present. Disposition Plan: Skilled nursing facility once adequately diuresed and improvement with C. difficile colitis and electrolyte replacement.   Consultants:  Cardiology 07/07/2015 Dr. Mare Ferrari  PCCM admission  Procedures:  CT abdomen and pelvis 07/05/2015  CT chest 07/05/2015  Chest x-ray 07/05/2015, 07/07/2015  Central line placement 07/05/2015  Antibiotics:  IV Flagyl 07/05/2015>>> 07/07/2015  Oral Flagyl 07/07/2015>>>>  IV Zosyn 07/04/2015>>>> 07/05/2015  Oral vancomycin 07/05/2015>>>>>  HPI/Subjective: Patient states he's feeling better. Patient denies any shortness of breath. Patient denies any chest pain.  Objective: Filed Vitals:   07/14/15 2227 07/15/15 0556  BP: 108/80 130/70  Pulse: 93 79  Temp: 98.5 F (36.9 C) 97.7 F (36.5 C)  Resp: 20 19    Intake/Output Summary (Last 24 hours) at 07/15/15 1158 Last data filed at 07/15/15 0927  Gross per 24 hour  Intake      0 ml  Output   2400 ml  Net  -2400 ml   Filed Weights   07/10/15 0500 07/13/15 0800 07/15/15 1000  Weight: 98.022 kg (216  lb 1.6 oz) 91.853 kg (202 lb 8  oz) 87.317 kg (192 lb 8 oz)    Exam:   General:  NAD  Cardiovascular: RRR  Respiratory: Some scattered bibasilar crackles.  Abdomen: Soft, nontender, nondistended, positive bowel sounds.  Musculoskeletal: No clubbing cyanosis or edema.  Data Reviewed: Basic Metabolic Panel:  Recent Labs Lab 07/09/15 0600 07/10/15 0500 07/11/15 0630 07/12/15 0645 07/13/15 0410 07/14/15 0535 07/15/15 0550  NA 145 146* 145 146* 147* 146* 145  K 3.1* 3.2* 3.1* 3.2* 3.2* 4.1 4.0  CL 115* 115* 116* 113* 116* 114* 114*  CO2 17* 18* 20* 22 24 23 22   GLUCOSE 186* 192* 174* 164* 129* 152* 72  BUN 20 15 11 8 7 6 6   CREATININE 1.06 0.95 0.85 0.74 0.73 0.80 0.73  CALCIUM 5.7* 6.0* 5.8* 6.0* 6.0* 5.9* 5.8*  MG 2.1 1.7 1.5* 1.7 1.6* 1.8 1.7  PHOS 1.3* 1.2* 1.2*  --  1.9*  --  2.0*   Liver Function Tests:  Recent Labs Lab 07/09/15 0600 07/10/15 0500 07/11/15 0630  ALBUMIN 1.7* 1.7* 1.6*   No results for input(s): LIPASE, AMYLASE in the last 168 hours. No results for input(s): AMMONIA in the last 168 hours. CBC:  Recent Labs Lab 07/09/15 0600 07/10/15 0500 07/11/15 0630 07/12/15 0645 07/13/15 0410  WBC 9.7 9.5 10.7* 11.2* 10.9*  NEUTROABS 7.1 7.2 8.4* 9.1* 8.9*  HGB 8.7* 8.3* 7.9* 7.9* 8.0*  HCT 26.5* 24.8* 23.7* 23.4* 24.2*  MCV 86.6 86.1 86.2 85.7 86.1  PLT 184 160 148* 135* 154   Cardiac Enzymes: No results for input(s): CKTOTAL, CKMB, CKMBINDEX, TROPONINI in the last 168 hours. BNP (last 3 results)  Recent Labs  06/19/15 1120 06/23/15 0530 07/05/15 0955  BNP 258.8* 147.8* 348.0*    ProBNP (last 3 results) No results for input(s): PROBNP in the last 8760 hours.  CBG:  Recent Labs Lab 07/14/15 0754 07/14/15 1214 07/14/15 1710 07/14/15 2139 07/15/15 0743  GLUCAP 128* 151* 154* 130* 70    Recent Results (from the past 240 hour(s))  Urine culture     Status: None   Collection Time: 07/07/15  6:24 AM  Result Value Ref Range Status   Specimen Description  URINE, CATHETERIZED  Final   Special Requests NONE  Final   Culture   Final    NO GROWTH 1 DAY Performed at [facility]    Report Status 07/08/2015 FINAL  Final     Studies: No results found.  Scheduled Meds: . antiseptic oral rinse  7 mL Mouth Rinse q12n4p  . atorvastatin  10 mg Oral q1800  . brimonidine  1 drop Right Eye Daily   And  . timolol  1 drop Right Eye Daily  . calcium citrate-vitamin D  2 tablet Oral TID PC & HS  . calcium gluconate  2 g Intravenous Once  . carvedilol  12.5 mg Oral BID WC  . chlorhexidine  15 mL Mouth Rinse BID  . furosemide  40 mg Oral BID  . heparin subcutaneous  5,000 Units Subcutaneous 3 times per day  . insulin aspart  0-5 Units Subcutaneous QHS  . insulin aspart  0-9 Units Subcutaneous TID WC  . insulin detemir  6 Units Subcutaneous QHS  . lactose free nutrition  237 mL Oral BID BM  . magnesium oxide  400 mg Oral Daily  . metroNIDAZOLE  500 mg Oral 3 times per day  . phosphorus  500 mg Oral BID  . potassium chloride  40 mEq Oral TID  . prednisoLONE acetate  1 drop Both Eyes BID  . saccharomyces boulardii  250 mg Oral BID  . sodium phosphate  Dextrose 5% IVPB  15 mmol Intravenous Once  . tamsulosin  0.4 mg Oral Daily  . vancomycin  500 mg Oral 4 times per day   Continuous Infusions:   Principal Problem:   Septic shock (HCC) Active Problems:   Sepsis ([place])   C. difficile colitis   Acute renal failure (HCC)   Elevated troponin I level   Acute diastolic (congestive) heart failure (HCC)   Hypokalemia   Hypomagnesemia   Hypophosphatemia    Time spent: 35 minutes    [facility] MD [facility] Pager [phone]. If 7PM-7AM, please contact night-coverage at [email or web address], password [facility] 07/15/2015, 11:58 AM  LOS: 10 days

## 2015-07-16 LAB — CBC
HEMATOCRIT: 26.6 % — AB (ref 39.0–52.0)
Hemoglobin: 8.6 g/dL — ABNORMAL LOW (ref 13.0–17.0)
MCH: 29.3 pg (ref 26.0–34.0)
MCHC: 32.3 g/dL (ref 30.0–36.0)
MCV: 90.5 fL (ref 78.0–100.0)
PLATELETS: 206 10*3/uL (ref 150–400)
RBC: 2.94 MIL/uL — ABNORMAL LOW (ref 4.22–5.81)
RDW: 18.2 % — AB (ref 11.5–15.5)
WBC: 8 10*3/uL (ref 4.0–10.5)

## 2015-07-16 LAB — BASIC METABOLIC PANEL
Anion gap: 9 (ref 5–15)
BUN: 6 mg/dL (ref 6–20)
CHLORIDE: 111 mmol/L (ref 101–111)
CO2: 25 mmol/L (ref 22–32)
CREATININE: 0.78 mg/dL (ref 0.61–1.24)
Calcium: 6.7 mg/dL — ABNORMAL LOW (ref 8.9–10.3)
GFR calc non Af Amer: >60 mL/min (ref >60)
Glucose, Bld: 78 mg/dL (ref 65–99)
Potassium: 4.5 mmol/L (ref 3.5–5.1)
SODIUM: 145 mmol/L (ref 135–145)

## 2015-07-16 LAB — GLUCOSE, CAPILLARY
GLUCOSE-CAPILLARY: 72 mg/dL (ref 65–99)
GLUCOSE-CAPILLARY: 114 mg/dL — AB (ref 65–99)
GLUCOSE-CAPILLARY: 179 mg/dL — AB (ref 65–99)
GLUCOSE-CAPILLARY: 156 mg/dL — AB (ref 65–99)

## 2015-07-16 LAB — PHOSPHORUS: PHOSPHORUS: 2.7 mg/dL (ref 2.5–4.6)

## 2015-07-16 LAB — VITAMIN D 25 HYDROXY (VIT D DEFICIENCY, FRACTURES): VIT D 25 HYDROXY: 31.2 ng/mL (ref 30.0–100.0)

## 2015-07-16 LAB — PTH, INTACT AND CALCIUM
Calcium, Total (PTH): 6.5 mg/dL — CL (ref 8.6–10.2)
PTH: 120 pg/mL — ABNORMAL HIGH (ref 15–65)

## 2015-07-16 LAB — MAGNESIUM: Magnesium: 1.8 mg/dL (ref 1.7–2.4)

## 2015-07-16 LAB — CALCITRIOL (1,25 DI-OH VIT D): Vit D, 1,25-Dihydroxy: 164 pg/mL — ABNORMAL HIGH (ref 19.9–79.3)

## 2015-07-16 MED ORDER — MAGNESIUM SULFATE 50 % IJ SOLN
3.0000 g | Freq: Once | INTRAVENOUS | Status: AC
  Administered 2015-07-16: 3 g via INTRAVENOUS
  Filled 2015-07-16: qty 6

## 2015-07-16 NOTE — Progress Notes (Signed)
CRITICAL VALUE ALERT  Critical value received:  PTH 120 Calcium 6.5  Date of notification:  07/16/2015  Time of notification:  0940  Critical value read back:Yes.    Nurse who received alert:  Kenna Gilbert   MD notified (1st page):  D. Grandville Silos  Time of first page:  0900  MD notified (2nd page): N/A   Time of second page: N/A   Responding MD:  D. Grandville Silos  Time MD responded:  0900

## 2015-07-16 NOTE — Progress Notes (Signed)
Occupational Therapy Treatment Patient Details Name: Jeremy Jennings MRN: [id number] DOB: [birth year] Today's Date: 07/16/2015    History of present illness 80 yo male who has a history of stage 4 prostate cancer, HTN, DM, tachycardia, recent GI bleed.  Dx of SBO, s/p resection 06/12/15.  Marland Kitchen Acute respiratoiry failure and cardiac arrest during hospital stay. Intubated 12/13. Extubated 12/14. DC to SNF 12/22, returned 12/24 with c diff.   OT comments  Pt motivated and participating well in OT.  Modified HEP--new written program to follow.  Follow Up Recommendations  SNF    Equipment Recommendations  3 in 1 bedside comode    Recommendations for Other Services      Precautions / Restrictions Precautions Precautions: Fall Precaution Comments: abd surgery, c diff, flexiseal,  Restrictions Weight Bearing Restrictions: No       Mobility Bed Mobility     Rolling: Supervision         General bed mobility comments: using rails to straighten pads underneath him  Transfers       Sit to Stand: Mod assist (from bed)         General transfer comment: assist to rise and stabilize    Balance   Sitting-balance support: Feet supported Sitting balance-Leahy Scale: Fair     Standing balance support: Bilateral upper extremity supported Standing balance-Leahy Scale: Poor                     ADL                                         General ADL Comments: Performed HEP from EOB, see exercises below--modified program.  Sat EOB x 5 minutes.  Did not want to get up to chair as buttocks was hurting.  He stood twice with mod A      Vision                     Perception     Praxis      Cognition   Behavior During Therapy: WFL for tasks assessed/performed Overall Cognitive Status: Within Functional Limits for tasks assessed                       Extremity/Trunk Assessment               Exercises Other Exercises Other  Exercises: performed level one theraband for hotizontal abduction and biceps x 10 reps, each arm.  Cues to stabilize upper arm during biceps Other Exercises: performed AROM for FF each arm, 3 sets of 3 with cues to keep elbow straight. When using theraband, pt compensates   Shoulder Instructions       General Comments      Pertinent Vitals/ Pain       Pain Assessment: Faces Faces Pain Scale: Hurts little more Pain Location: buttocks Pain Descriptors / Indicators: Grimacing Pain Intervention(s): Limited activity within patient's tolerance;Monitored during session;Repositioned  Home Living Family/patient expects to be discharged to:: Skilled nursing facility                                        Prior Functioning/Environment              Frequency Min 2X/week  Progress Toward Goals  OT Goals(current goals can now be found in the care plan section)  Progress towards OT goals: Progressing toward goals     Plan      Co-evaluation                 End of Session     Activity Tolerance Patient tolerated treatment well;No increased pain   Patient Left in bed;with call bell/phone within reach;with bed alarm set   Nurse Communication          Time: [id number] OT Time Calculation (min): 33 min  Charges: OT General Charges $OT Visit: 1 Procedure OT Treatments $Therapeutic Activity: 8-22 mins $Therapeutic Exercise: 8-22 mins  Claire Dolores 07/16/2015, 4:06 PM  Lesle Chris, OTR/L [phone] 07/16/2015

## 2015-07-16 NOTE — Progress Notes (Signed)
[facility] PROGRESS NOTE  Jeremy Jennings [id number] DOB: 1935/08/23 DOA: 07/04/2015 PCP: [name], MD  Brief interval history  80 year old nursing home resident male with history of chronic diastolic CHF and type 2 diabetes mellitus presenting with fever and diarrhea. He was recently hospitalized for small bowel obstruction requiring exploratory laparotomy with small bowel resection which was complicated by a brief episode of cardiac arrest. He also had UTI and was treated with antibiotics and discharged on 12/23. Patient on presentation had septic shock secondary to C. difficile colitis and was admitted to ICU. Transferred care to hospitalist service on 12/28. Patient currently on oral Flagyl and oral vancomycin with some improvement with C. difficile colitis. A shin with a crease volume in stools. Patient also noted to have gone into CHF exacerbation was placed on IV diuretics and followed by cardiology. Patient currently on oral diuretics and volume status is stable.   Assessment/Plan: #1 septic shock secondary to severe C. difficile colitis Clinical improvement. Patient is afebrile. WBC trending down. Patient still with loose stools but volume of stools decreasing per nursing. D/C rectal pouch. Continue current regimen of oral Flagyl and vancomycin.   #2 severe C. difficile colitis Volume of stools decreasing. D/C rectal pouch. Continue oral Flagyl and vancomycin. Follow. Likely treat for 21 days.  #3 acute hypoxic respiratory failure secondary to acute on chronic diastolic CHF Patient with clinical improvement. Patient diuresed and well on IV Lasix. Patient would almost a 20 pound weight gain since admission. Recent 2-D echo with normal EF with no wall motion abnormalities. Patient is -14.727 L during this hospitalization. Patient's weight goal is less than 200 pounds and patient currently at 189 pounds. Continue strict I's and O's and daily weights. Patient has been  transitioned to oral Lasix per cardiology. Follow.  #4 elevated troponin Likely a demand ischemia in the setting of acute illness of sepsis secondary to C. difficile colitis. Patient currently asymptomatic. EKG with no acute ischemic changes. No further workup at this time per cardiology.  #5 hypertension Stable. Continue current regimen.  #6 hypokalemia/ hypocalcemia/hypomagnesemia/hypophosphatemia/Vitamin D deficiency Likely secondary to GI losses. Continue to replete electrolytes. PTH elevated 120. Anti-calcium at 6.5. Vitamin D 25-hydroxy at 31.2. Phosphorus level was decreased at 2. Magnesium level currently at 1.8. Likely vitamin D deficiency. Continue calcium citrate 500 mg plus vitamin D 2 tablets 4 times daily. Follow.  #7 recent small bowel obstruction Status post exploratory laparotomy with small bowel resection. Follow-up with general surgery as outpatient.  #8 type 2 diabetes Hemoglobin A1c was 6.3 on 05/28/2015. Continue Levemir. Sliding scale insulin.  #9 protein calorie malnutrition Continued nutritional supplementation.  #10 prophylaxis Heparin for DVT prophylaxis.  Code Status: Full Family Communication: Updated patient and daughter at bedside. Disposition Plan: Skilled nursing facility once adequately diuresed and improvement with C. difficile colitis and electrolyte replacement.   Consultants:  Cardiology 07/07/2015 Dr. Mare Ferrari  PCCM admission  Procedures:  CT abdomen and pelvis 07/05/2015  CT chest 07/05/2015  Chest x-ray 07/05/2015, 07/07/2015  Central line placement 07/05/2015  Antibiotics:  IV Flagyl 07/05/2015>>> 07/07/2015  Oral Flagyl 07/07/2015>>>>  IV Zosyn 07/04/2015>>>> 07/05/2015  Oral vancomycin 07/05/2015>>>>>  HPI/Subjective: Patient states he's feeling better. Patient denies any shortness of breath. Patient denies any chest pain.  Objective: Filed Vitals:   07/16/15 0536 07/16/15 1459  BP: 135/65 129/59  Pulse: 97 102   Temp: 98 F (36.7 C) 98.2 F (36.8 C)  Resp: 20 20    Intake/Output Summary (Last 24 hours)  at 07/16/15 1839 Last data filed at 07/16/15 1700  Gross per 24 hour  Intake      0 ml  Output   3000 ml  Net  -3000 ml   Filed Weights   07/13/15 0800 07/15/15 1000 07/16/15 1459  Weight: 91.853 kg (202 lb 8 oz) 87.317 kg (192 lb 8 oz) 85.9 kg (189 lb 6 oz)    Exam:   General:  NAD  Cardiovascular: RRR  Respiratory: CTAB anterior lung fields.  Abdomen: Soft, nontender, nondistended, positive bowel sounds.  Musculoskeletal: No clubbing cyanosis. Trace-1+ BLE edema  Data Reviewed: Basic Metabolic Panel:  Recent Labs Lab 07/10/15 0500 07/11/15 0630 07/12/15 0645 07/13/15 0410 07/14/15 0535 07/15/15 0550 07/15/15 0945 07/16/15 0455  NA 146* 145 146* 147* 146* 145  --  145  K 3.2* 3.1* 3.2* 3.2* 4.1 4.0  --  4.5  CL 115* 116* 113* 116* 114* 114*  --  111  CO2 18* 20* 22 24 23 22   --  25  GLUCOSE 192* 174* 164* 129* 152* 72  --  78  BUN 15 11 8 7 6 6   --  6  CREATININE 0.95 0.85 0.74 0.73 0.80 0.73  --  0.78  CALCIUM 6.0* 5.8* 6.0* 6.0* 5.9* 5.8* 6.5* 6.7*  MG 1.7 1.5* 1.7 1.6* 1.8 1.7  --  1.8  PHOS 1.2* 1.2*  --  1.9*  --  2.0*  --  2.7   Liver Function Tests:  Recent Labs Lab 07/10/15 0500 07/11/15 0630  ALBUMIN 1.7* 1.6*   No results for input(s): LIPASE, AMYLASE in the last 168 hours. No results for input(s): AMMONIA in the last 168 hours. CBC:  Recent Labs Lab 07/10/15 0500 07/11/15 0630 07/12/15 0645 07/13/15 0410 07/16/15 0455  WBC 9.5 10.7* 11.2* 10.9* 8.0  NEUTROABS 7.2 8.4* 9.1* 8.9*  --   HGB 8.3* 7.9* 7.9* 8.0* 8.6*  HCT 24.8* 23.7* 23.4* 24.2* 26.6*  MCV 86.1 86.2 85.7 86.1 90.5  PLT 160 148* 135* 154 206   Cardiac Enzymes: No results for input(s): CKTOTAL, CKMB, CKMBINDEX, TROPONINI in the last 168 hours. BNP (last 3 results)  Recent Labs  06/19/15 1120 06/23/15 0530 07/05/15 0955  BNP 258.8* 147.8* 348.0*    ProBNP (last  3 results) No results for input(s): PROBNP in the last 8760 hours.  CBG:  Recent Labs Lab 07/15/15 1705 07/15/15 2203 07/16/15 0805 07/16/15 1150 07/16/15 1627  GLUCAP 99 141* 72 114* 179*    Recent Results (from the past 240 hour(s))  Urine culture     Status: None   Collection Time: 07/07/15  6:24 AM  Result Value Ref Range Status   Specimen Description URINE, CATHETERIZED  Final   Special Requests NONE  Final   Culture   Final    NO GROWTH 1 DAY Performed at [facility]    Report Status 07/08/2015 FINAL  Final     Studies: No results found.  Scheduled Meds: . antiseptic oral rinse  7 mL Mouth Rinse q12n4p  . atorvastatin  10 mg Oral q1800  . brimonidine  1 drop Right Eye Daily   And  . timolol  1 drop Right Eye Daily  . calcium citrate-vitamin D  2 tablet Oral TID PC & HS  . carvedilol  12.5 mg Oral BID WC  . chlorhexidine  15 mL Mouth Rinse BID  . feeding supplement (ENSURE ENLIVE)  237 mL Oral BID BM  . furosemide  40 mg Oral BID  .  heparin subcutaneous  5,000 Units Subcutaneous 3 times per day  . insulin aspart  0-5 Units Subcutaneous QHS  . insulin aspart  0-9 Units Subcutaneous TID WC  . insulin detemir  6 Units Subcutaneous QHS  . magnesium oxide  400 mg Oral Daily  . metroNIDAZOLE  500 mg Oral 3 times per day  . phosphorus  500 mg Oral BID  . prednisoLONE acetate  1 drop Both Eyes BID  . saccharomyces boulardii  250 mg Oral BID  . tamsulosin  0.4 mg Oral Daily  . vancomycin  500 mg Oral 4 times per day   Continuous Infusions:   Principal Problem:   Septic shock (HCC) Active Problems:   Sepsis (HCC)   C. difficile colitis   Acute renal failure (HCC)   Elevated troponin I level   Acute diastolic (congestive) heart failure (HCC)   Hypokalemia   Hypomagnesemia   Hypophosphatemia   Vitamin D deficiency    Time spent: 40 minutes    [facility] MD [facility] Pager [phone]. If 7PM-7AM, please contact night-coverage at  [email or web address], password [facility] 07/16/2015, 6:39 PM  LOS: 11 days

## 2015-07-17 DIAGNOSIS — E559 Vitamin D deficiency, unspecified: Secondary | ICD-10-CM

## 2015-07-17 DIAGNOSIS — E876 Hypokalemia: Secondary | ICD-10-CM

## 2015-07-17 LAB — CBC
HCT: 26.7 % — ABNORMAL LOW (ref 39.0–52.0)
HEMOGLOBIN: 8.6 g/dL — AB (ref 13.0–17.0)
MCH: 29.5 pg (ref 26.0–34.0)
MCHC: 32.2 g/dL (ref 30.0–36.0)
MCV: 91.4 fL (ref 78.0–100.0)
Platelets: 227 10*3/uL (ref 150–400)
RBC: 2.92 MIL/uL — AB (ref 4.22–5.81)
RDW: 18.1 % — ABNORMAL HIGH (ref 11.5–15.5)
WBC: 6.9 10*3/uL (ref 4.0–10.5)

## 2015-07-17 LAB — RETICULOCYTES
RBC.: 2.92 MIL/uL — ABNORMAL LOW (ref 4.22–5.81)
RETIC COUNT ABSOLUTE: 52.6 10*3/uL (ref 19.0–186.0)
Retic Ct Pct: 1.8 % (ref 0.4–3.1)

## 2015-07-17 LAB — IRON AND TIBC: IRON: 60 ug/dL (ref 45–182)

## 2015-07-17 LAB — VITAMIN B12: VITAMIN B 12: 3185 pg/mL — AB (ref 180–914)

## 2015-07-17 LAB — MAGNESIUM: MAGNESIUM: 1.9 mg/dL (ref 1.7–2.4)

## 2015-07-17 LAB — BASIC METABOLIC PANEL
ANION GAP: 9 (ref 5–15)
BUN: 5 mg/dL — ABNORMAL LOW (ref 6–20)
CO2: 27 mmol/L (ref 22–32)
Calcium: 7.2 mg/dL — ABNORMAL LOW (ref 8.9–10.3)
Chloride: 107 mmol/L (ref 101–111)
Creatinine, Ser: 0.71 mg/dL (ref 0.61–1.24)
GLUCOSE: 104 mg/dL — AB (ref 65–99)
POTASSIUM: 4.2 mmol/L (ref 3.5–5.1)
SODIUM: 143 mmol/L (ref 135–145)

## 2015-07-17 LAB — GLUCOSE, CAPILLARY
GLUCOSE-CAPILLARY: 165 mg/dL — AB (ref 65–99)
Glucose-Capillary: 88 mg/dL (ref 65–99)

## 2015-07-17 LAB — FOLATE: FOLATE: 7.5 ng/mL (ref >5.9)

## 2015-07-17 LAB — FERRITIN: Ferritin: 1981 ng/mL — ABNORMAL HIGH (ref 24–336)

## 2015-07-17 LAB — PHOSPHORUS: PHOSPHORUS: 2.7 mg/dL (ref 2.5–4.6)

## 2015-07-17 MED ORDER — INSULIN ASPART 100 UNIT/ML FLEXPEN: 0.0000 [IU] | PEN_INJECTOR | Freq: Three times a day (TID) | SUBCUTANEOUS | Status: DC

## 2015-07-17 MED ORDER — SACCHAROMYCES BOULARDII 250 MG PO CAPS: 250.0000 mg | ORAL_CAPSULE | Freq: Two times a day (BID) | ORAL | Status: AC

## 2015-07-17 MED ORDER — VANCOMYCIN 50 MG/ML ORAL SOLUTION: 500.0000 mg | Freq: Four times a day (QID) | ORAL | Status: DC

## 2015-07-17 MED ORDER — INSULIN DETEMIR 100 UNIT/ML FLEXPEN: 6.0000 [IU] | PEN_INJECTOR | Freq: Every day | SUBCUTANEOUS | Status: DC

## 2015-07-17 MED ORDER — FUROSEMIDE 40 MG PO TABS: 40.0000 mg | ORAL_TABLET | Freq: Two times a day (BID) | ORAL | Status: DC

## 2015-07-17 MED ORDER — METRONIDAZOLE 500 MG PO TABS
500.0000 mg | ORAL_TABLET | Freq: Three times a day (TID) | ORAL | Status: DC
Start: 2015-07-17 — End: 2015-07-28

## 2015-07-17 MED ORDER — ENSURE ENLIVE PO LIQD: 237.0000 mL | Freq: Two times a day (BID) | ORAL | Status: AC

## 2015-07-17 MED ORDER — ACETAMINOPHEN 325 MG PO TABS: 650.0000 mg | ORAL_TABLET | Freq: Four times a day (QID) | ORAL | Status: DC | PRN

## 2015-07-17 NOTE — Clinical Social Work Placement (Signed)
Patient is set to discharge to [facility] today. Patient & daughter, [name] aware. RN, Caryl Comes aware. Daughter to transport to [facility].      Raynaldo Opitz, [facility] Clinical Social Worker cell #: [phone]    CLINICAL SOCIAL WORK PLACEMENT  NOTE  Date:  07/17/2015  Patient Details  Name: NIKALAS CHIMENTO MRN: [id number] Date of Birth: 05-Oct-1935  Clinical Social Work is seeking post-discharge placement for this patient at the [place] level of care (*CSW will initial, date and re-position this form in  chart as items are completed):  No   Patient/family provided with [facility] Work Department's list of facilities offering this level of care within the geographic area requested by the patient (or if unable, by the patient's family).  Yes   Patient/family informed of their freedom to choose among providers that offer the needed level of care, that participate in Medicare, Medicaid or managed care program needed by the patient, have an available bed and are willing to accept the patient.  No   Patient/family informed of [facility]'s ownership interest in [facility] and [facility], as well as of the fact that they are under no obligation to receive care at these facilities.  PASRR submitted to EDS on       PASRR number received on       Existing PASRR number confirmed on 07/08/15     FL2 transmitted to all facilities in geographic area requested by pt/family on 07/08/15     FL2 transmitted to all facilities within larger geographic area on       Patient informed that his/her managed care company has contracts with or will negotiate with certain facilities, including the following:        Yes   Patient/family informed of bed offers received.  Patient chooses bed at [facility]     Physician recommends and patient chooses bed at      Patient to be transferred to [facility] on 07/17/15.  Patient to be transferred to facility by daughter's car     Patient family notified on 07/17/15 of transfer.  Name of family member notified:  patient's daughter, [name] via phone     PHYSICIAN       Additional Comment:    _______________________________________________ Standley Brooking, LCSW 07/17/2015, 1:55 PM

## 2015-07-17 NOTE — Discharge Summary (Signed)
Physician Discharge Summary  Jeremy Jennings XBM:[id number] DOB: September 04, 1935 DOA: 07/04/2015  PCP: [name], MD  Admit date: 07/04/2015 Discharge date: 07/17/2015  Time spent: Greater than 30 minutes  Recommendations for Outpatient Follow-up:  1. MD at SNF in 3 days with repeat labs (CBC, CMP, Mg, Phos). Patient's diuretic doses may need to be adjusted at SNF. 2. Melina Copa, PA-C/Cardiology, 07/27/2015 at 2 PM.  3. Dr. [name], General surgery. 07/30/2015 at 9:45 AM.  4. Dr. Zola Button, Oncology in 2 weeks  Discharge Diagnoses:  Principal Problem:   Septic shock [facility]) Active Problems:   Sepsis ([place])   C. difficile colitis   Acute renal failure ([place])   Elevated troponin I level   Acute diastolic (congestive) heart failure (HCC)   Hypokalemia   Hypomagnesemia   Hypophosphatemia   Vitamin D deficiency   Discharge Condition: Improved & Stable  Diet recommendation: Heart healthy and diabetic diet.  Filed Weights   07/13/15 0800 07/15/15 1000 07/16/15 1459  Weight: 91.853 kg (202 lb 8 oz) 87.317 kg (192 lb 8 oz) 85.9 kg (189 lb 6 oz)    History of present illness:  80 year old male with history of DM 2/IDDM, HTN, HLD, prostate cancer, GI bleed, PACs, recent hospitalization 06/11/15-07/03/15 for exploratory laparotomy with small bowel resection (by Dr. [name] on 08/18/23 close), complicated by a brief episode of cardiac arrest, discharged to SNF for rehabilitation, presented to ED from SNF on 07/04/15 with complaints of fever and diarrhea. He was admitted to ICU under CCM service for septic shock secondary to C. difficile colitis. Transferred to the hospitalist service on 12/28.  Hospital Course:   Septic shock secondary to severe C. difficile colitis - Septic shock has resolved.  Severe C. difficile colitis - Patient was started on Flagyl and vancomycin since 12/24. Improved. 2 BMs reported in the last 24 hours in the last one had minimum formed stool. No  abdominal pain, nausea or vomiting reported. Complete total 14 days of antibiotics on 07/18/15. Continue probiotics for a couple of weeks then DC. Avoid unnecessary antibiotics.  Acute hypoxic respiratory failure secondary to decompensated CHF - Hypoxia has resolved. Currently saturating at 100% on room air.  Acute on chronic diastolic CHF - Initially diuresed with IV Lasix. Patient is -15 L since admission. Recent 2-D echo showed normal EF with no wall motion abnormalities. Cardiology was consulted. Patient has been transitioned to oral Lasix. Monitor closely at SNF and adjust diuretic dose as needed. Outpatient follow-up with cardiology.  Elevated troponin - Secondary to demand ischemia in the setting of acute illness, sepsis secondary to C. difficile colitis. EKG showed no acute changes. No further workup per cardiology.  Essential hypertension - Controlled.  Electrolyte abnormalities (hypokalemia, hypocalcemia, hypomagnesemia and hypophosphatemia) - Likely secondary to GI losses. Improved with improvement in diarrhea and with electrolyte supplementation. Follow labs at SNF in the next couple of days. Will not discharge on any supplements-may be considered at SNF if has persistent electrolyte abnormalities.  Vitamin D deficiency - Continue prior home vitamin supplementation.  Recent small bowel obstruction - Status post exploratory laparotomy and small bowel resection. Outpatient follow-up with general surgery  Type II DM/IDDM - Hemoglobin A1c 6.3 on 11/17. Continue reduced dose of Levemir and sensitive NovoLog SSI. Other diabetic medications are on hold. Monitor and adjust regimen at SNF as deemed necessary.  Protein calorie malnutrition - Continue nutritional supplementation.  Anemia - Stable  Sinus tachycardia with frequent PACs, PVCs - Continue beta blockers.  Discussed  extensively with patient's daughter at  bedside.   Consultations:  Cardiology  CCM  Procedures:  Central Line - DC'ed   Antibiotics:  IV Flagyl 07/05/2015>>> 07/07/2015  Oral Flagyl 07/07/2015>>>>  IV Zosyn 07/04/2015>>>> 07/05/2015  Oral vancomycin 07/05/2015>>>>>   Discharge Exam:  Complaints: States that diarrhea has significantly improved. Reports 2 BMs in the last 24 hours. The last one was early this morning-minimal amount and formed. No abdominal pain, nausea or vomiting. Appetite improving. Denies dyspnea or chest pain.  Filed Vitals:   07/16/15 1459 07/16/15 2150 07/17/15 0615 07/17/15 0758  BP: 129/59 111/63 136/66 134/62  Pulse: 102 106 105 102  Temp: 98.2 F (36.8 C) 97.5 F (36.4 C) 97.6 F (36.4 C)   TempSrc: Oral Oral Oral   Resp: _0 Height:      Weight: 85.9 kg (189 lb 6 oz)     SpO2: 100% 100% 100%     General exam: Pleasant elderly male sitting up comfortably in bed. Respiratory system: Slightly diminished breath sounds in the bases with occasional basal crackles but otherwise clear to auscultation. No increased work of breathing. Cardiovascular system: S1 & S2 heard, RRR. No JVD, murmurs, gallops, clicks. Trace bilateral ankle edema . Telemetry: SR-ST in the low 100s. Gastrointestinal system: Abdomen is nondistended, soft and nontender. Normal bowel sounds heard. Healed midline laparotomy scar. Central nervous system: Alert and oriented. No focal neurological deficits. Extremities: Symmetric 5 x 5 power.  Discharge Instructions      Discharge Instructions    (HEART FAILURE PATIENTS) Call MD:  Anytime you have any of the following symptoms: 1) 3 pound weight gain in 24 hours or 5 pounds in 1 week 2) shortness of breath, with or without a dry hacking cough 3) swelling in the hands, feet or stomach 4) if you have to sleep on extra pillows at night in order to breathe.    Complete by:  As directed      Call MD for:  difficulty breathing, headache or visual disturbances     Complete by:  As directed      Call MD for:  extreme fatigue    Complete by:  As directed      Call MD for:  hives    Complete by:  As directed      Call MD for:  persistant dizziness or light-headedness    Complete by:  As directed      Call MD for:  persistant nausea and vomiting    Complete by:  As directed      Call MD for:  severe uncontrolled pain    Complete by:  As directed      Call MD for:  temperature >100.4    Complete by:  As directed      Call MD for:    Complete by:  As directed   Worsening diarrhea.     Diet - low sodium heart healthy    Complete by:  As directed      Diet Carb Modified    Complete by:  As directed      Increase activity slowly    Complete by:  As directed             Medication List    STOP taking these medications        acetaminophen 650 MG CR tablet  Commonly known as:  TYLENOL  Replaced by:  acetaminophen 325 MG tablet     diltiazem 360 MG  24 hr capsule  Commonly known as:  TIAZAC     docusate sodium 100 MG capsule  Commonly known as:  COLACE     guaiFENesin 100 MG/5ML liquid  Commonly known as:  ROBITUSSIN     Liraglutide 18 MG/3ML Sopn     oxyCODONE-acetaminophen 5-325 MG tablet  Commonly known as:  PERCOCET/ROXICET     pioglitazone 30 MG tablet  Commonly known as:  ACTOS      TAKE these medications        abiraterone Acetate 250 MG tablet  Commonly known as:  ZYTIGA  Take 4 tablets (1,000 mg total) by mouth daily. Take on an empty stomach 1 hour before or 2 hours after a meal     ACCU-CHEK AVIVA PLUS w/Device Kit  Use to check blood sugar 2 times per day dx code E11.65     ACCU-CHEK SOFTCLIX LANCETS lancets  Use as instructed to check blood sugar 2 times per day dx code E11.65     acetaminophen 325 MG tablet  Commonly known as:  TYLENOL  Take 2 tablets (650 mg total) by mouth every 6 (six) hours as needed for mild pain, moderate pain, fever or headache (temp > 101.5).     atorvastatin 10 MG tablet  Commonly  known as:  LIPITOR  take 1 tablet by mouth once daily     carvedilol 12.5 MG tablet  Commonly known as:  COREG  Take 1 tablet (12.5 mg total) by mouth 2 (two) times daily with a meal.     COMBIGAN 0.2-0.5 % ophthalmic solution  Generic drug:  brimonidine-timolol  Place 1 drop into the right eye daily.     feeding supplement (ENSURE ENLIVE) Liqd  Take 237 mLs by mouth 2 (two) times daily between meals.     furosemide 40 MG tablet  Commonly known as:  LASIX  Take 1 tablet (40 mg total) by mouth 2 (two) times daily.     glucose blood test strip  Commonly known as:  ACCU-CHEK AVIVA PLUS  Use as instructed to check blood sugar 2 times per day dx code E11.65     insulin aspart 100 UNIT/ML FlexPen  Commonly known as:  NOVOLOG FLEXPEN  Inject 0-9 Units into the skin 3 (three) times daily with meals. CBG < 70: implement hypoglycemia protocol CBG 70 - 120: 0 units CBG 121 - 150: 1 unit CBG 151 - 200: 2 units CBG 201 - 250: 3 units CBG 251 - 300: 5 units CBG 301 - 350: 7 units CBG 351 - 400: 9 units CBG > 400: call MD.     Insulin Detemir 100 UNIT/ML Pen  Commonly known as:  LEVEMIR  Inject 6 Units into the skin daily at 10 pm.     Insulin Pen Needle 31G X 5 MM Misc  Commonly known as:  B-D UF III MINI PEN NEEDLES  1 each by Does not apply route 2 (two) times daily.     megestrol 400 MG/10ML suspension  Commonly known as:  MEGACE  Take 10 mLs (400 mg total) by mouth 2 (two) times daily.     metroNIDAZOLE 500 MG tablet  Commonly known as:  FLAGYL  Take 1 tablet (500 mg total) by mouth every 8 (eight) hours. DC after 07/18/2015 doses.     ondansetron 4 MG tablet  Commonly known as:  ZOFRAN  Take 1 tablet (4 mg total) by mouth every 8 (eight) hours as needed for nausea or vomiting.  prednisoLONE acetate 1 % ophthalmic suspension  Commonly known as:  PRED FORTE  Place 1 drop into both eyes 2 (two) times daily.     saccharomyces boulardii 250 MG capsule  Commonly known as:   FLORASTOR  Take 1 capsule (250 mg total) by mouth 2 (two) times daily.     tamsulosin 0.4 MG Caps capsule  Commonly known as:  FLOMAX  Take 0.4 mg by mouth daily.     vancomycin 50 mg/mL oral solution  Commonly known as:  VANCOCIN  Take 10 mLs (500 mg total) by mouth every 6 (six) hours. DC after 07/18/2015 doses.     vitamin B-12 1000 MCG tablet  Commonly known as:  CYANOCOBALAMIN  Take 1,000 mcg by mouth daily.     VITAMIN D PO  Take 5,000 Units by mouth daily. Taking 5000 daily       Follow-up Information    Follow up with Edward Jolly, MD On 07/30/2015.   Specialty:  General Surgery   Why:  9:45am, arrive by 9:15am for paperwork   Contact information:   [address] [place] [address] [phone]       Follow up with [name], MD On 07/27/2015.   Specialty:  Cardiology   Why:  at 2:00Pm with Melina Copa PA for Dr. Salley Scarlet information:   [address] [address] [phone]        The results of significant diagnostics from this hospitalization (including imaging, microbiology, ancillary and laboratory) are listed below for reference.    Significant Diagnostic Studies: Dg Chest 1 View  07/02/2015  CLINICAL DATA:  Three weeks post bowel resection an exploratory laparotomy. Fever. EXAM: CHEST 1 VIEW COMPARISON:  06/30/2015 FINDINGS: Interval removal of enteric tube and left IJ central venous catheter. Right-sided PICC line unchanged. Lungs are somewhat hypoinflated with bibasilar opacification likely atelectasis although cannot exclude infection. Cardiomediastinal silhouette and remainder of the exam is unchanged. IMPRESSION: Hypoinflation with bibasilar opacification likely atelectasis although cannot exclude infection. Electronically Signed   By: Marin Olp M.D.   On: 07/02/2015 12:51   Dg Chest 2 View  07/05/2015  CLINICAL DATA:  Fever, nausea and diarrhea today. History of diabetes, hypertension,  tachycardia. EXAM: CHEST  2 VIEW COMPARISON:  CT chest June 05, 2015 at 0118 hours FINDINGS: Cardiomediastinal silhouette is normal for this low inspiratory portable examination with crowded vascular markings. Nipple shadows in lung bases. Small pleural effusions better seen on today's CT chest. Bibasilar atelectasis. No pneumothorax. Diffusely sclerotic axial skeleton compatible with patient's history of metastatic prostate cancer. IMPRESSION: Small pleural effusions and bibasilar atelectasis better seen on today's dedicated CT chest. Electronically Signed   By: Elon Alas M.D.   On: 07/05/2015 02:08   Dg Chest 2 View  06/19/2015  CLINICAL DATA:  Fever and weakness EXAM: CHEST  2 VIEW COMPARISON:  Four days ago FINDINGS: Persistent opacity at the left base with elevated diaphragm. Right upper extremity PICC has been placed, tip at the SVC. Normal heart size for technique. Stable upper mediastinal contours. No pulmonary edema or pleural effusion. IMPRESSION: 1. Persistent left basilar atelectasis. Given fever history, note that there could be superimposed pneumonia. 2. New right upper extremity PICC is in good position. Electronically Signed   By: Monte Fantasia M.D.   On: 06/19/2015 10:50   Ct Chest W Contrast  07/05/2015  CLINICAL DATA:  Acute onset of generalized abdominal pain. Fever and diarrhea. Initial  encounter. EXAM: CT CHEST, ABDOMEN, AND PELVIS WITH CONTRAST TECHNIQUE: Multidetector CT imaging of the chest, abdomen and pelvis was performed following the standard protocol during bolus administration of intravenous contrast. CONTRAST:  138m OMNIPAQUE IOHEXOL 300 MG/ML  SOLN COMPARISON:  CTA of the chest and CT of the abdomen and pelvis performed 06/19/2015 FINDINGS: CT CHEST Trace bilateral pleural effusions are noted, with mild bibasilar atelectasis. No pneumothorax is seen. No significant focal airspace consolidation is appreciated. No masses are identified. Diffuse coronary artery  calcifications are seen. The mediastinum is otherwise unremarkable. No mediastinal lymphadenopathy is appreciated. The great vessels are grossly unremarkable in appearance. 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 No pericardial effusion is identified. The thyroid gland is unremarkable in appearance. No axillary lymphadenopathy is appreciated. Diffuse sclerotic lesions are noted throughout the visualized osseous structures, reflecting known metastatic prostate cancer. CT ABDOMEN AND PELVIS The liver and spleen are unremarkable in appearance. The gallbladder is within normal limits. The pancreas and adrenal glands are unremarkable. Scattered bilateral renal cysts are seen, measuring up to 3.4 cm in size. There is no evidence of hydronephrosis. No renal or ureteral stones are seen. Mild nonspecific perinephric stranding is noted bilaterally. Contrast progresses into the renal calyces only on repeat delayed images at 11 minutes, concerning for renal insufficiency. No free fluid is identified. The small bowel is unremarkable in appearance. A bowel suture line at the mid abdomen is unremarkable. The stomach is within normal limits. No acute vascular abnormalities are seen. Minimal calcification is noted along the abdominal aorta and its branches. The appendix is normal in caliber, without evidence of appendicitis. The colon is unremarkable in appearance, aside from mild wall thickening along the rectum, raising question for mild proctitis. Mild nonspecific presacral stranding is noted. The bladder is decompressed and not well assessed. The prostate remains normal in size. No inguinal lymphadenopathy is seen. Diffuse sclerotic lesions are noted throughout the visualized osseous structures, reflecting known metastatic prostate cancer. IMPRESSION: 1. Trace bilateral pleural effusions, with mild bibasilar atelectasis. No definite evidence of pneumonia. 2. Apparent mild wall  thickening along the rectum raises question for mild proctitis. Mild nonspecific presacral stranding noted. 3. Delayed excretion of contrast from the kidneys, concerning for renal insufficiency. 4. Diffuse coronary artery calcifications seen. 5. 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 6. Scattered bilateral renal cysts seen. 7. Diffuse sclerotic lesions throughout the visualized osseous structures, reflecting known metastatic prostate cancer. Electronically Signed   By: JGarald BaldingM.D.   On: 07/05/2015 01:49   Ct Angio Chest Pe W/cm &/or Wo Cm  06/19/2015  CLINICAL DATA:  Inpatient hospitalized with central abdominal cramping and constipation. Post laparotomy and bowel resection 1 week prior. Today with fever and tachycardia. Patient with history of prostate cancer with bony metastasis. EXAM: CT ANGIOGRAPHY CHEST CT ABDOMEN AND PELVIS WITH CONTRAST TECHNIQUE: Multidetector CT imaging of the chest was performed using the standard protocol during bolus administration of intravenous contrast. Multiplanar CT image reconstructions and MIPs were obtained to evaluate the vascular anatomy. Multidetector CT imaging of the abdomen and pelvis was performed using the standard protocol during bolus administration of intravenous contrast. CONTRAST:  1028mOMNIPAQUE IOHEXOL 350 MG/ML SOLN COMPARISON:  Chest radiographs earlier this day. CT abdomen/pelvis 06/11/2015 FINDINGS: CTA CHEST FINDINGS Vascular: Suboptimal contrast bolus for evaluation of pulmonary embolus, there are no filling defects in the main, right, left, or lobar pulmonary arteries. Segmental and subsegmental branches cannot be  assessed, particularly in the lower lobes where there is motion artifact. Normal caliber thoracic aorta. 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 Minimal multi chamber cardiomegaly with coronary artery calcifications. Lungs/pleura: Small bilateral  pleural effusions, unchanged on the right and increased on the left from prior abdominal CT. There is adjacent compressive atelectasis at both lung bases, left greater than right. Air bronchograms on the left, with question of superimposed pneumonia. Breathing motion artifact partially obscures detailed lung evaluation. Mediastinum/lymph nodes: Prominent AP window lymph node measures 1.9 cm short axis dimension. There is otherwise no mediastinal or hilar adenopathy. No pericardial effusion. There is a right upper extremity PICC, with tip in the distal SVC. Enteric contrast within the esophagus which is mildly tortuous. Osseous structures: Multiple sclerotic foci throughout bilateral ribs, thoracic spine, and sternum consistent with known osseous metastatic disease. CT ABDOMEN and PELVIS FINDINGS Liver: No focal hepatic lesion. Hepatobiliary: Gallbladder physiologically distended. No calcified gallstone. Pancreas: No ductal dilatation or inflammation. Spleen: Normal. Adrenal glands: No nodule. Kidneys: Symmetric renal enhancement and excretion. No hydronephrosis. Bilateral renal cysts again seen. Stomach/Bowel: Stomach physiologically distended with enteric contrast, there is wall thickening involving the fundus. Fat density lesion involving the distal gastric body measures 1.6 x 1.4 cm, unchanged, query intramural lipoma. Additional fat density lesion in the region of pylorus is also unchanged. Contrast filled prominent proximal small bowel loops without transition point. Fluid-filled and distal small bowel loops, with enteric chain sutures in the right lower quadrant. Colon is tortuous with colonic diverticulosis, no diverticulitis. Appendix remains in situ. Vascular/Lymphatic: No retroperitoneal adenopathy. Abdominal aorta is normal in caliber. Atherosclerosis of the abdominal aorta and its branches. Reproductive: Prostate gland is enlarged causing mass effect on the bladder base. Bladder: Minimally distended with  mild wall thickening, likely related to degree of distention. Other: Post laparotomy with midline skin staples. No subcutaneous fluid collection. No pneumoperitoneum or free air in the abdomen. Small amount of dependent free fluid the pelvis in mesentery. Whole body wall edema most consistent with third-spacing. Scattered foci of air in the anterior abdominal wall likely related subcutaneous injections. Musculoskeletal: Diffuse multifocal scleroses throughout the included skeleton consistent with osseous metastatic disease. Review of the MIP images confirms the above findings. IMPRESSION: 1. Suboptimal contrast bolus for evaluation of pulmonary embolus, allowing for this, no central pulmonary embolus is seen involving the central and lobar pulmonary arteries. 2. Bilateral pleural effusions, increased on the left from prior. There is associated compressive atelectasis bilaterally, the presence of air bronchograms on the left may reflect superimposed pneumonia. 3. Prominent contrast and fluid-filled small bowel without transition point, suspect postoperative ileus. Recurrent small bowel obstruction is felt less likely. 4. Additional chronic findings as described. Electronically Signed   By: Jeb Levering M.D.   On: 06/19/2015 19:58   Ct Abdomen Pelvis W Contrast  07/05/2015  CLINICAL DATA:  Acute onset of generalized abdominal pain. Fever and diarrhea. Initial encounter. EXAM: CT CHEST, ABDOMEN, AND PELVIS WITH CONTRAST TECHNIQUE: Multidetector CT imaging of the chest, abdomen and pelvis was performed following the standard protocol during bolus administration of intravenous contrast. CONTRAST:  172m OMNIPAQUE IOHEXOL 300 MG/ML  SOLN COMPARISON:  CTA of the chest and CT of the abdomen and pelvis performed 06/19/2015 FINDINGS: CT CHEST Trace bilateral pleural effusions are noted, with mild bibasilar atelectasis. No pneumothorax is seen. No significant focal airspace consolidation is appreciated. No masses are  identified. Diffuse coronary artery calcifications are seen. The mediastinum is  otherwise unremarkable. No mediastinal lymphadenopathy is appreciated. The great vessels are grossly unremarkable in appearance. 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 No pericardial effusion is identified. The thyroid gland is unremarkable in appearance. No axillary lymphadenopathy is appreciated. Diffuse sclerotic lesions are noted throughout the visualized osseous structures, reflecting known metastatic prostate cancer. CT ABDOMEN AND PELVIS The liver and spleen are unremarkable in appearance. The gallbladder is within normal limits. The pancreas and adrenal glands are unremarkable. Scattered bilateral renal cysts are seen, measuring up to 3.4 cm in size. There is no evidence of hydronephrosis. No renal or ureteral stones are seen. Mild nonspecific perinephric stranding is noted bilaterally. Contrast progresses into the renal calyces only on repeat delayed images at 11 minutes, concerning for renal insufficiency. No free fluid is identified. The small bowel is unremarkable in appearance. A bowel suture line at the mid abdomen is unremarkable. The stomach is within normal limits. No acute vascular abnormalities are seen. Minimal calcification is noted along the abdominal aorta and its branches. The appendix is normal in caliber, without evidence of appendicitis. The colon is unremarkable in appearance, aside from mild wall thickening along the rectum, raising question for mild proctitis. Mild nonspecific presacral stranding is noted. The bladder is decompressed and not well assessed. The prostate remains normal in size. No inguinal lymphadenopathy is seen. Diffuse sclerotic lesions are noted throughout the visualized osseous structures, reflecting known metastatic prostate cancer. IMPRESSION: 1. Trace bilateral pleural effusions, with mild bibasilar atelectasis. No definite evidence of  pneumonia. 2. Apparent mild wall thickening along the rectum raises question for mild proctitis. Mild nonspecific presacral stranding noted. 3. Delayed excretion of contrast from the kidneys, concerning for renal insufficiency. 4. Diffuse coronary artery calcifications seen. 5. 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 6. Scattered bilateral renal cysts seen. 7. Diffuse sclerotic lesions throughout the visualized osseous structures, reflecting known metastatic prostate cancer. Electronically Signed   By: Garald Balding M.D.   On: 07/05/2015 01:49   Ct Abdomen Pelvis W Contrast  06/19/2015  CLINICAL DATA:  Inpatient hospitalized with central abdominal cramping and constipation. Post laparotomy and bowel resection 1 week prior. Today with fever and tachycardia. Patient with history of prostate cancer with bony metastasis. EXAM: CT ANGIOGRAPHY CHEST CT ABDOMEN AND PELVIS WITH CONTRAST TECHNIQUE: Multidetector CT imaging of the chest was performed using the standard protocol during bolus administration of intravenous contrast. Multiplanar CT image reconstructions and MIPs were obtained to evaluate the vascular anatomy. Multidetector CT imaging of the abdomen and pelvis was performed using the standard protocol during bolus administration of intravenous contrast. CONTRAST:  128m OMNIPAQUE IOHEXOL 350 MG/ML SOLN COMPARISON:  Chest radiographs earlier this day. CT abdomen/pelvis 06/11/2015 FINDINGS: CTA CHEST FINDINGS Vascular: Suboptimal contrast bolus for evaluation of pulmonary embolus, there are no filling defects in the main, right, left, or lobar pulmonary arteries. Segmental and subsegmental branches cannot be assessed, particularly in the lower lobes where there is motion artifact. Normal caliber thoracic aorta. 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 Minimal multi chamber cardiomegaly with coronary artery calcifications.  Lungs/pleura: Small bilateral pleural effusions, unchanged on the right and increased on the left from prior abdominal CT. There is adjacent compressive atelectasis at both lung bases, left greater than right. Air bronchograms on the left, with question of superimposed pneumonia. Breathing motion artifact partially obscures detailed lung evaluation. Mediastinum/lymph nodes: Prominent AP window lymph node measures 1.9  cm short axis dimension. There is otherwise no mediastinal or hilar adenopathy. No pericardial effusion. There is a right upper extremity PICC, with tip in the distal SVC. Enteric contrast within the esophagus which is mildly tortuous. Osseous structures: Multiple sclerotic foci throughout bilateral ribs, thoracic spine, and sternum consistent with known osseous metastatic disease. CT ABDOMEN and PELVIS FINDINGS Liver: No focal hepatic lesion. Hepatobiliary: Gallbladder physiologically distended. No calcified gallstone. Pancreas: No ductal dilatation or inflammation. Spleen: Normal. Adrenal glands: No nodule. Kidneys: Symmetric renal enhancement and excretion. No hydronephrosis. Bilateral renal cysts again seen. Stomach/Bowel: Stomach physiologically distended with enteric contrast, there is wall thickening involving the fundus. Fat density lesion involving the distal gastric body measures 1.6 x 1.4 cm, unchanged, query intramural lipoma. Additional fat density lesion in the region of pylorus is also unchanged. Contrast filled prominent proximal small bowel loops without transition point. Fluid-filled and distal small bowel loops, with enteric chain sutures in the right lower quadrant. Colon is tortuous with colonic diverticulosis, no diverticulitis. Appendix remains in situ. Vascular/Lymphatic: No retroperitoneal adenopathy. Abdominal aorta is normal in caliber. Atherosclerosis of the abdominal aorta and its branches. Reproductive: Prostate gland is enlarged causing mass effect on the bladder base.  Bladder: Minimally distended with mild wall thickening, likely related to degree of distention. Other: Post laparotomy with midline skin staples. No subcutaneous fluid collection. No pneumoperitoneum or free air in the abdomen. Small amount of dependent free fluid the pelvis in mesentery. Whole body wall edema most consistent with third-spacing. Scattered foci of air in the anterior abdominal wall likely related subcutaneous injections. Musculoskeletal: Diffuse multifocal scleroses throughout the included skeleton consistent with osseous metastatic disease. Review of the MIP images confirms the above findings. IMPRESSION: 1. Suboptimal contrast bolus for evaluation of pulmonary embolus, allowing for this, no central pulmonary embolus is seen involving the central and lobar pulmonary arteries. 2. Bilateral pleural effusions, increased on the left from prior. There is associated compressive atelectasis bilaterally, the presence of air bronchograms on the left may reflect superimposed pneumonia. 3. Prominent contrast and fluid-filled small bowel without transition point, suspect postoperative ileus. Recurrent small bowel obstruction is felt less likely. 4. Additional chronic findings as described. Electronically Signed   By: Jeb Levering M.D.   On: 06/19/2015 19:58   Dg Chest Port 1 View  07/07/2015  CLINICAL DATA:  80 year old male with respiratory failure. Initial encounter. Metastatic prostate cancer. EXAM: PORTABLE CHEST 1 VIEW COMPARISON:  07/05/2015 and earlier. FINDINGS: Portable AP semi upright view at at 0440 hours. Stable left IJ central line. Continued confluent platelike opacity at the left lung base. Atelectasis along the right minor fissure has decreased. Small bilateral pleural effusions were more apparent on the 07/05/2015 CT. Pulmonary vascularity is stable. Stable cardiac size and mediastinal contours. IMPRESSION: Overall stable ventilation since 07/05/2015. Left greater than right pulmonary  atelectasis and small pleural effusions. Electronically Signed   By: Genevie Ann M.D.   On: 07/07/2015 07:02   Dg Chest Port 1 View  07/05/2015  CLINICAL DATA:  Acute respiratory failure. History of prostate cancer. EXAM: PORTABLE CHEST 1 VIEW COMPARISON:  Single view of the chest and PA, lateral chest and CT chest earlier today. FINDINGS: Left IJ catheter is unchanged. Small bilateral pleural effusions and subsegmental atelectasis left base appearing chain. No pneumothorax. IMPRESSION: No change in left basilar atelectasis and small bilateral pleural effusions. Electronically Signed   By: Inge Rise M.D.   On: 07/05/2015 14:37   Dg Chest Port 1 View  07/05/2015  CLINICAL DATA:  Central line placement EXAM: PORTABLE CHEST 1 VIEW COMPARISON:  July 05, 2015 1:37 a.m. FINDINGS: The heart size and mediastinal contours are stable. Left jugular central venous line is identified with distal tip in superior vena cava. There is no pneumothorax. There are are mild atelectasis of both lung bases with minimal left pleural effusion. The visualized skeletal structures are stable. IMPRESSION: Left central venous line distal tip in the superior vena cava. There is no pneumothorax. Mild atelectasis of both lung bases with minimal left pleural effusion. Electronically Signed   By: Abelardo Diesel M.D.   On: 07/05/2015 07:26   Dg Chest Port 1 View  06/30/2015  CLINICAL DATA:  Pneumonia. EXAM: PORTABLE CHEST 1 VIEW COMPARISON:  06/29/2015 . FINDINGS: NG tube, left IJ line, right PICC line in stable position . Cardiomegaly with bilateral pulmonary interstitial prominence again noted. Findings suggest congestive heart failure. Low lung volumes with basilar atelectasis. No prominent pleural effusion or pneumothorax IMPRESSION: 1. Lines and tubes in stable position. 2. Cardiomegaly with pulmonary interstitial prominence consistent with congestive heart failure. Similar findings noted on prior exam. 3. Lung volumes with  bibasilar atelectasis . Electronically Signed   By: [name]  Register   On: 06/30/2015 07:16   Dg Chest Port 1 View  06/29/2015  CLINICAL DATA:  Ileus.  Hypertension. EXAM: PORTABLE CHEST 1 VIEW COMPARISON:  06/27/2015. FINDINGS: Left IJ line, right PICC line, NG tube in stable position. Mediastinum hilar structures are normal. Cardiomegaly with bilateral pulmonary interstitial prominence noted. Findings consistent with congestive heart failure. Pneumonitis cannot be excluded. Persistent low lung volumes with bibasilar atelectasis. No pleural effusion or pneumothorax. IMPRESSION: 1. Lines and tubes in stable position. 2. Cardiomegaly with bilateral mild pulmonary interstitial prominence suggesting mild congestive heart failure. 3. Persistent low lung volumes with bibasilar atelectasis. Electronically Signed   By: [name]  Register   On: 06/29/2015 07:12   Dg Chest Port 1 View  06/27/2015  CLINICAL DATA:  Shortness of breath, abdominal distension, nasogastric tube placement, hypertension, type II diabetes mellitus, prostate cancer, former smoker EXAM: PORTABLE CHEST 1 VIEW COMPARISON:  Portable exam 1023 hours compared to 06/25/2015 FINDINGS: Nasogastric tube extends into stomach. RIGHT arm PICC line tip projects over SVC. LEFT jugular central venous catheter tip projects over confluence of LEFT brachiocephalic vein and SVC. Rotated to the RIGHT. Enlargement of cardiac silhouette. Atherosclerotic calcification aorta. Bibasilar atelectasis. Improved perihilar infiltrates. No pleural effusion or pneumothorax. Gaseous distention of bowel in LEFT upper quadrant. IMPRESSION: Bibasilar atelectasis. Enlargement of cardiac silhouette. Improved pulmonary infiltrates. Electronically Signed   By: Lavonia Dana M.D.   On: 06/27/2015 11:16   Dg Chest Port 1 View  06/25/2015  CLINICAL DATA:  Respiratory failure. EXAM: PORTABLE CHEST 1 VIEW COMPARISON:  06/24/2015. FINDINGS: Interim extubation and removal of NG tube . Right  PICC line, left IJ line stable position. Cardiomegaly. Persistent interstitial prominence, interstitial edema cannot be excluded. Persistent low lung volumes with right mid lung field and left base atelectasis and/or infiltrates. No pleural effusion or pneumothorax. IMPRESSION: 1. Interim extubation removal of NG tube. Left IJ line and right PICC line stable position. 2. Persistent cardiomegaly pulmonary interstitial prominence suggesting interstitial edema. No interim change. 3. Persistent low lung volumes with right mid lung and left base subsegmental atelectasis. Electronically Signed   By: [place]   On: 06/25/2015 07:18   Dg Chest Port 1 View  06/24/2015  CLINICAL DATA:  Respiratory failure. EXAM: PORTABLE CHEST 1 VIEW COMPARISON:  06/23/2015.  FINDINGS: Endotracheal tube, NG tube, left IJ line, right PICC line in stable position. Mediastinum hilar structures are stable. Cardiomegaly with diffuse bilateral pulmonary interstitial prominence consistent with congestive heart failure. Pneumonitis cannot be excluded. Persistent right mid lung and left base subsegmental atelectasis. No prominent pleural effusion. No pneumothorax. IMPRESSION: 1. Lines and tubes in stable position. 2. Persistent cardiomegaly. Interim appearance of bilateral from interstitial prominence. Findings suggest congestive heart failure. 3. Persistent low lung volumes with persistent right mid lung and left base subsegmental atelectasis. Electronically Signed   By: [name]  Register   On: 06/24/2015 07:18   Dg Chest Port 1 View  06/23/2015  CLINICAL DATA:  Central line placement EXAM: PORTABLE CHEST 1 VIEW COMPARISON:  06/23/2015 FINDINGS: Left internal jugular central line has been placed with the tip in the upper SVC. No pneumothorax. Endotracheal tube is 3 cm above the carina. NG tube is in the stomach. Right PICC line is unchanged. Patchy right upper lobe and left lower lobe airspace opacities are again noted, unchanged. Low  lung volumes. IMPRESSION: Left central line placement with the tip in the upper SVC. No pneumothorax. Otherwise no significant change. Electronically Signed   By: Rolm Baptise M.D.   On: 06/23/2015 17:15   Dg Chest Port 1 View  06/23/2015  CLINICAL DATA:  ET tube repositioning EXAM: PORTABLE CHEST 1 VIEW COMPARISON:  06/23/2015 FINDINGS: Endotracheal tube has been retracted and is approximately 2 cm above the carina. Right PICC line is unchanged as is the NG tube. There is mild cardiomegaly. Left lower lobe atelectasis or infiltrate noted. Patchy right upper lobe opacity also noted. These are unchanged since prior study. IMPRESSION: Interval retraction of the endotracheal tube, now approximately 2 cm above the carina. Stable patchy right upper lobe and left lower lobe airspace opacities. Electronically Signed   By: Rolm Baptise M.D.   On: 06/23/2015 16:29   Dg Chest Port 1 View  06/23/2015  CLINICAL DATA:  Status post endotracheal intubation EXAM: PORTABLE CHEST - 1 VIEW COMPARISON:  06/23/2015 FINDINGS: Cardiac shadow is stable. Some mild atelectatic changes remain in the left lung base and right mid lung. Nasogastric catheter is been placed but is coiled at the level of the diaphragm in may lie within a hiatal hernia. A right-sided PICC line is noted at the cavoatrial junction. An endotracheal tube is seen at the level of the carina directing slightly into the right mainstem bronchus. This should be withdrawn approximately 4 cm. No other focal abnormality is noted. IMPRESSION: Tubes and lines as described above. The endotracheal tube should be withdrawn 4 cm. Bilateral patchy atelectatic changes. These results were called by telephone at the time of interpretation on 06/23/2015 at 2:14 pm to [facility], the patients nurse Who verbally acknowledged these results. Electronically Signed   By: Inez Catalina M.D.   On: 06/23/2015 14:19   Dg Chest Port 1 View  06/23/2015  CLINICAL DATA:  Acute onset of shortness of  breath and decreased O2 saturation. Initial encounter. EXAM: PORTABLE CHEST 1 VIEW COMPARISON:  Chest radiograph and CT of the chest performed 06/19/2015 FINDINGS: The lungs are mildly hypoexpanded. Left basilar airspace opacity raises concern for pneumonia. There is no evidence of pleural effusion or pneumothorax. The cardiomediastinal silhouette is borderline normal in size. No acute osseous abnormalities are seen. IMPRESSION: Lungs mildly hypoexpanded. Left basilar airspace opacity is concerning for pneumonia. Electronically Signed   By: Garald Balding M.D.   On: 06/23/2015 05:17   Dg Abd 2 Views  06/28/2015  CLINICAL DATA:  Ileus following gastrointestinal surgery, hypertension, diabetes mellitus, history prostate cancer EXAM: ABDOMEN - 2 VIEW COMPARISON:  06/27/2015 FINDINGS: Tip of nasogastric tube projects over stomach. Skin clips project over lower abdomen. Rotated to the RIGHT. Air-filled loops of large and small bowel without significant distention. Small amount of retained contrast in distal colon. No bowel wall thickening or evidence of obstruction. Paper clip projects over the RIGHT hip region, question external to patient. IMPRESSION: Nonspecific bowel gas pattern. Electronically Signed   By: Lavonia Dana M.D.   On: 06/28/2015 12:55   Dg Abd Portable 1v  06/27/2015  CLINICAL DATA:  Abdominal distention today.  NG tube placement. EXAM: PORTABLE ABDOMEN - 1 VIEW COMPARISON:  06/19/2015 and prior CTs. 06/12/2015 and prior radiographs. FINDINGS: An NG tube is identified with tip overlying the mid stomach. Mild gaseous distention of colon and small bowel again noted. Surgical staples overlying the abdomen and pelvis are again noted. No other significant changes noted. IMPRESSION: NG tube with tip overlying the mid stomach. Continued mild gaseous distension of small bowel and colon-question ileus. Electronically Signed   By: Margarette Canada M.D.   On: 06/27/2015 10:45    Microbiology: No results found  for this or any previous visit (from the past 240 hour(s)).   Labs: Basic Metabolic Panel:  Recent Labs Lab 07/11/15 0630  07/13/15 0410 07/14/15 0535 07/15/15 0550 07/15/15 0945 07/16/15 0455 07/17/15 0535  NA 145  < > 147* 146* 145  --  145 143  K 3.1*  < > 3.2* 4.1 4.0  --  4.5 4.2  CL 116*  < > 116* 114* 114*  --  111 107  CO2 20*  < > _0 --  25 27  GLUCOSE 174*  < > 129* 152* 72  --  78 104*  BUN 11  < > _1 --  6 5*  CREATININE 0.85  < > 0.73 0.80 0.73  --  0.78 0.71  CALCIUM 5.8*  < > 6.0* 5.9* 5.8* 6.5* 6.7* 7.2*  MG 1.5*  < > 1.6* 1.8 1.7  --  1.8 1.9  PHOS 1.2*  --  1.9*  --  2.0*  --  2.7 2.7  < > = values in this interval not displayed. Liver Function Tests:  Recent Labs Lab 07/11/15 0630  ALBUMIN 1.6*   No results for input(s): LIPASE, AMYLASE in the last 168 hours. No results for input(s): AMMONIA in the last 168 hours. CBC:  Recent Labs Lab 07/11/15 0630 07/12/15 0645 07/13/15 0410 07/16/15 0455 07/17/15 0535  WBC 10.7* 11.2* 10.9* 8.0 6.9  NEUTROABS 8.4* 9.1* 8.9*  --   --   HGB 7.9* 7.9* 8.0* 8.6* 8.6*  HCT 23.7* 23.4* 24.2* 26.6* 26.7*  MCV 86.2 85.7 86.1 90.5 91.4  PLT 148* 135* 154 206 227   Cardiac Enzymes: No results for input(s): CKTOTAL, CKMB, CKMBINDEX, TROPONINI in the last 168 hours. BNP: BNP (last 3 results)  Recent Labs  06/19/15 1120 06/23/15 0530 07/05/15 0955  BNP 258.8* 147.8* 348.0*    ProBNP (last 3 results) No results for input(s): PROBNP in the last 8760 hours.  CBG:  Recent Labs Lab 07/16/15 1150 07/16/15 1627 07/16/15 2303 07/17/15 0802 07/17/15 1148  GLUCAP 114* 179* 156* 88 165*        Signed:  Dashawn Golda, MD, FACP, FHM. [facility] Pager [phone]  If 7PM-7AM, please contact night-coverage [email or web address] Password [email or web address] 07/17/2015, 1:35 PM

## 2015-07-17 NOTE — Progress Notes (Signed)
Pt discharged via private vehicle to [facility].

## 2015-07-17 NOTE — Progress Notes (Signed)
Physical Therapy Treatment Patient Details Name: Jeremy Jennings MRN: [id number] DOB: [birth year] Today's Date: 07/17/2015    History of Present Illness 80 yo male who has a history of stage 4 prostate cancer, HTN, DM, tachycardia, recent GI bleed.  Dx of SBO, s/p resection 06/12/15.  Marland Kitchen Acute respiratoiry failure and cardiac arrest during hospital stay. Intubated 12/13. Extubated 12/14. DC to SNF 12/22, returned 12/24 with c diff.    PT Comments    Pt ambulated 55' with RW and min/guard assist, distance limited by fatigue. Mod assist for sit to stand. Performed BLE exercises in sitting, he has B hip flexor weakness. Improved activity tolerance today.  Follow Up Recommendations  SNF;Supervision/Assistance - 24 hour     Equipment Recommendations  None recommended by PT    Recommendations for Other Services       Precautions / Restrictions Precautions Precautions: Fall Precaution Comments: abd surgery, c diff Restrictions Weight Bearing Restrictions: No    Mobility  Bed Mobility               General bed mobility comments: NT- up in chair  Transfers Overall transfer level: Needs assistance Equipment used: Rolling walker (2 wheeled) Transfers: Sit to/from Stand Sit to Stand: Mod assist         General transfer comment: mod A to rise, heavy use of BUEs on armrests, verbal cues for setup  Ambulation/Gait Ambulation/Gait assistance: Min guard Ambulation Distance (Feet): 65 Feet Assistive device: Rolling walker (2 wheeled) Gait Pattern/deviations: Step-through pattern     General Gait Details: steady with RW, distance limited by fatigue, HR 123 with walking, no dyspnea   Stairs            Wheelchair Mobility    Modified Rankin (Stroke Patients Only)       Balance     Sitting balance-Leahy Scale: Good       Standing balance-Leahy Scale: Poor                      Cognition Arousal/Alertness: Awake/alert Behavior During Therapy: WFL  for tasks assessed/performed Overall Cognitive Status: Within Functional Limits for tasks assessed                      Exercises General Exercises - Lower Extremity Ankle Circles/Pumps: AROM;15 reps;Seated Long Arc Quad: AROM;Both;10 reps;Seated Hip Flexion/Marching: AAROM;Both;10 reps;Seated    General Comments        Pertinent Vitals/Pain Pain Assessment: No/denies pain    Home Living                      Prior Function            PT Goals (current goals can now be found in the care plan section) Acute Rehab PT Goals Patient Stated Goal: get back to being independent PT Goal Formulation: With patient Time For Goal Achievement: 07/21/15 Potential to Achieve Goals: Good Progress towards PT goals: Progressing toward goals    Frequency  Min 3X/week    PT Plan Current plan remains appropriate    Co-evaluation             End of Session Equipment Utilized During Treatment: Gait belt Activity Tolerance: Patient limited by fatigue Patient left: with call bell/phone within reach;in chair;with family/visitor present     Time: [id number] PT Time Calculation (min) (ACUTE ONLY): 11 min  Charges:  $Gait Training: 8-22 mins  G Codes:      Blondell Reveal Kistler 07/17/2015, 1:05 PM [phone]

## 2015-07-24 ENCOUNTER — Telehealth: Payer: Self-pay | Admitting: *Deleted

## 2015-07-24 ENCOUNTER — Other Ambulatory Visit: Payer: Self-pay | Admitting: Oncology

## 2015-07-24 ENCOUNTER — Emergency Department
Admission: EM | Admit: 2015-07-24 | Discharge: 2015-07-24 | Disposition: A | Discharge status: Home / Self Care | Payer: Medicare Other | Attending: Emergency Medicine | Admitting: Emergency Medicine

## 2015-07-24 ENCOUNTER — Encounter: Payer: Self-pay | Admitting: *Deleted

## 2015-07-24 DIAGNOSIS — E119 Type 2 diabetes mellitus without complications: Secondary | ICD-10-CM | POA: Diagnosis not present

## 2015-07-24 DIAGNOSIS — I129 Hypertensive chronic kidney disease with stage 1 through stage 4 chronic kidney disease, or unspecified chronic kidney disease: Secondary | ICD-10-CM | POA: Diagnosis not present

## 2015-07-24 DIAGNOSIS — Z792 Long term (current) use of antibiotics: Secondary | ICD-10-CM | POA: Diagnosis not present

## 2015-07-24 DIAGNOSIS — Z794 Long term (current) use of insulin: Secondary | ICD-10-CM | POA: Diagnosis not present

## 2015-07-24 DIAGNOSIS — R531 Weakness: Secondary | ICD-10-CM | POA: Diagnosis present

## 2015-07-24 DIAGNOSIS — Z79899 Other long term (current) drug therapy: Secondary | ICD-10-CM | POA: Diagnosis not present

## 2015-07-24 DIAGNOSIS — R197 Diarrhea, unspecified: Secondary | ICD-10-CM | POA: Diagnosis not present

## 2015-07-24 DIAGNOSIS — Z87891 Personal history of nicotine dependence: Secondary | ICD-10-CM | POA: Diagnosis not present

## 2015-07-24 DIAGNOSIS — N182 Chronic kidney disease, stage 2 (mild): Secondary | ICD-10-CM | POA: Diagnosis not present

## 2015-07-24 DIAGNOSIS — K921 Melena: Secondary | ICD-10-CM | POA: Insufficient documentation

## 2015-07-24 LAB — CBC WITH DIFFERENTIAL/PLATELET
BASOS PCT: 0 %
BLASTS: 0 %
Band Neutrophils: 8 %
Basophils Absolute: 0 10*3/uL (ref 0–0.1)
Eosinophils Absolute: 0.1 10*3/uL (ref 0–0.7)
Eosinophils Relative: 1 %
HEMATOCRIT: 22.6 % — AB (ref 40.0–52.0)
HEMOGLOBIN: 7.3 g/dL — AB (ref 13.0–18.0)
LYMPHS PCT: 12 %
Lymphs Abs: 0.7 10*3/uL — ABNORMAL LOW (ref 1.0–3.6)
MCH: 28.4 pg (ref 26.0–34.0)
MCHC: 32.5 g/dL (ref 32.0–36.0)
MCV: 87.2 fL (ref 80.0–100.0)
MYELOCYTES: 1 %
Metamyelocytes Relative: 1 %
Monocytes Absolute: 0.1 10*3/uL — ABNORMAL LOW (ref 0.2–1.0)
Monocytes Relative: 2 %
NEUTROS PCT: 75 %
NRBC: 0 /100{WBCs}
Neutro Abs: 4.9 10*3/uL (ref 1.4–6.5)
OTHER: 0 %
PROMYELOCYTES ABS: 0 %
Platelets: 225 10*3/uL (ref 150–440)
RBC: 2.59 MIL/uL — AB (ref 4.40–5.90)
RDW: 17.5 % — ABNORMAL HIGH (ref 11.5–14.5)
WBC: 5.8 10*3/uL (ref 3.8–10.6)

## 2015-07-24 LAB — COMPREHENSIVE METABOLIC PANEL
ALBUMIN: 1.7 g/dL — AB (ref 3.5–5.0)
ALK PHOS: 393 U/L — AB (ref 38–126)
ALT: 18 U/L (ref 17–63)
ANION GAP: 7 (ref 5–15)
AST: 52 U/L — ABNORMAL HIGH (ref 15–41)
BILIRUBIN TOTAL: 0.8 mg/dL (ref 0.3–1.2)
BUN: 28 mg/dL — ABNORMAL HIGH (ref 6–20)
CALCIUM: 6.5 mg/dL — AB (ref 8.9–10.3)
CO2: 24 mmol/L (ref 22–32)
Chloride: 105 mmol/L (ref 101–111)
Creatinine, Ser: 0.95 mg/dL (ref 0.61–1.24)
GFR calc non Af Amer: >60 mL/min (ref >60)
GLUCOSE: 363 mg/dL — AB (ref 65–99)
POTASSIUM: 3.6 mmol/L (ref 3.5–5.1)
SODIUM: 136 mmol/L (ref 135–145)
TOTAL PROTEIN: 6.3 g/dL — AB (ref 6.5–8.1)

## 2015-07-24 LAB — TYPE AND SCREEN
ABO/RH(D): B POS
Antibody Screen: NEGATIVE

## 2015-07-24 LAB — CBC
HEMATOCRIT: 21.6 % — AB (ref 40.0–52.0)
HEMOGLOBIN: 7 g/dL — AB (ref 13.0–18.0)
MCH: 28.2 pg (ref 26.0–34.0)
MCHC: 32.3 g/dL (ref 32.0–36.0)
MCV: 87.1 fL (ref 80.0–100.0)
Platelets: 223 10*3/uL (ref 150–440)
RBC: 2.48 MIL/uL — AB (ref 4.40–5.90)
RDW: 17.1 % — AB (ref 11.5–14.5)
WBC: 5.9 10*3/uL (ref 3.8–10.6)

## 2015-07-24 LAB — TROPONIN I: Troponin I: <0.03 ng/mL (ref <0.031)

## 2015-07-24 LAB — ABO/RH: ABO/RH(D): B POS

## 2015-07-24 MED ORDER — SODIUM CHLORIDE 0.9 % IV SOLN
Freq: Once | INTRAVENOUS | Status: AC
  Administered 2015-07-24: 17:00:00 via INTRAVENOUS

## 2015-07-24 NOTE — Telephone Encounter (Signed)
Per Dr. Alen Blew, he can see the patient on 07/30/15 @ 3:15 pm. POF sent to schedulers. Also, continue to [place]. Patient verbalized understanding.

## 2015-07-24 NOTE — ED Notes (Signed)
Pt arrived to ED via EMS from [facility] where he is in rehab after an abd surgery. Pt reports increased weakness since yesterday and facility reports hgb went from 7.2 yesterday to 6.0 today. Pt has hx of anemia. Pt reports receiving a total of 7 units of blood since May 2016 and has had a colonoscopy and endoscopy to investigate reason for chronically low hgb. Pt is alert and oriented x 4 upon arrival to ED and other than generalized weakness and diarrhea pt has no other medical complaints.

## 2015-07-24 NOTE — ED Notes (Addendum)
First 250 bolus given. Pt remains having HR increase from 108 to 116 when going from lying to sitting. MD made aware. Second bolus started. Lung sounds remain clear.

## 2015-07-24 NOTE — Discharge Instructions (Signed)
Please be sure to drink plenty of fluids. [facility] let them know if you're lightheaded again. I would recommend rechecking the white count again tomorrow. Right now it has been stable. Please also return for any further problems including abdominal pain bloody diarrhea

## 2015-07-24 NOTE — ED Notes (Addendum)
Facility called and [facility] reports there is no transportation available. Family reports they will transport pt home. Pt taken to car via wheelchair by ED staff and is helped into car. Pt in no acute distress.

## 2015-07-24 NOTE — ED Provider Notes (Signed)
[facility] Emergency Department Provider Note  ____________________________________________  Time seen: Approximately 1:54 PM  I have reviewed the triage vital signs and the nursing notes.   HISTORY  Chief Complaint Weakness    HPI Jeremy Jennings is a 80 y.o. male patient sent from [facility] where he is for rehabilitation from abdominal surgery for small bowel obstruction. Reportedly yesterday he had a hemoglobin 7.2 and today it was 6. Patient has a history of previous GI bleed. He reports she's had endoscopy and colonoscopy and they have not been able to discover the source of bleeding in the past. Reports he is feeling weaker today. His been weak or getting weaker for several days. He has not noticed any blood in the stool or black tarry stools. He is also on contact precautions for C. difficile and has been having diarrhea for the last approximately 2 weeks. Patient had the surgery just before Christmas. He denies any abdominal pain at present.   Past Medical History  Diagnosis Date  . Essential hypertension   . Type 2 diabetes mellitus ([place])   . Hyperlipidemia   . Prostate cancer ([place])   . History of GI bleed   . Tachycardia   . PAC (premature atrial contraction)     Frequent    Patient Active Problem List   Diagnosis Date Noted  . Vitamin D deficiency 07/16/2015  . Hypokalemia   . Hypomagnesemia   . Hypophosphatemia   . Acute diastolic (congestive) heart failure ([place])   . C. difficile colitis   . Acute renal failure ([place])   . Elevated troponin I level   . Sepsis ([place]) 07/05/2015  . Septic shock ([place]) 07/05/2015  . Aspiration of vomitus   . Cardiac arrest due to respiratory disorder ([place]) 06/24/2015  . 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 Acute diastolic CHF (congestive heart failure), NYHA class 4 ([place]) 06/23/2015  . Acute respiratory failure ([place])   . Enterococcus UTI   . HCAP (healthcare-associated  pneumonia) 06/19/2015  . Sinus tachycardia ([place]) 06/19/2015  . Pedal edema 06/19/2015  . Generalized weakness 06/19/2015  . Metabolic acidosis 29/79/8921  . Type 2 diabetes mellitus ([place]) 06/19/2015  . SBO (small bowel obstruction) ischemic s/p LOA & SB resection 06/12/2015 06/11/2015  . Acute GI bleeding 05/27/2015  . GI bleed 05/27/2015  . Low hemoglobin 12/10/2014  . Trigger finger of both hands 12/10/2014  . Anemia 11/09/2014  . Diabetes ([place]) 04/21/2014  . Hypotestosteronism 04/21/2014  . Cancer ([place]) 04/21/2014  . Prostate cancer ([place]) 11/20/2013  . Elevated PSA 08/29/2013  . Chronic kidney disease, stage II (mild) 08/08/2013  . BPH associated with nocturia 07/24/2013  . Benign hypertensive heart disease without heart failure 07/24/2013  . PAC (premature atrial contraction) 07/24/2012  . Bursitis of left shoulder 07/15/2011  . Hyperlipidemia   . Hypertension     Past Surgical History  Procedure Laterality Date  . Tonsillectomy    . Hemorroidectomy  1969  . Esophagogastroduodenoscopy N/A 05/28/2015    Procedure: ESOPHAGOGASTRODUODENOSCOPY (EGD);  Surgeon: [name], MD;  Location: [name];  Service: Endoscopy;  Laterality: N/A;  . Inguinal hernia repair Bilateral AFE 16-17  . Hernia repair  1981    DR McDonald  . Laparotomy N/A 06/12/2015    Procedure: EXPLORATORY LAPAROTOMY;  Surgeon: [name], MD;  Location: [facility];  Service: General;  Laterality: N/A;  . Bowel resection N/A 06/12/2015    Procedure: SMALL BOWEL RESECTION;  Surgeon: [name], MD;  Location: [facility];  Service: General;  Laterality: N/A;    Current Outpatient Rx  Name  Route  Sig  Dispense  Refill  . abiraterone Acetate (ZYTIGA) 250 MG tablet   Oral   Take 4 tablets (1,000 mg total) by mouth daily. Take on an empty stomach 1 hour before or 2 hours after a meal   120 tablet   0     Faxed to [facility] [phone]   . ACCU-CHEK SOFTCLIX LANCETS lancets      Use as instructed  to check blood sugar 2 times per day dx code E11.65   100 each   3   . acetaminophen (TYLENOL) 325 MG tablet   Oral   Take 2 tablets (650 mg total) by mouth every 6 (six) hours as needed for mild pain, moderate pain, fever or headache (temp > 101.5).         Marland Kitchen atorvastatin (LIPITOR) 10 MG tablet      take 1 tablet by mouth once daily   30 tablet   5   . Blood Glucose Monitoring Suppl (ACCU-CHEK AVIVA PLUS) W/DEVICE KIT      Use to check blood sugar 2 times per day dx code E11.65   1 kit   0   . carvedilol (COREG) 12.5 MG tablet   Oral   Take 1 tablet (12.5 mg total) by mouth 2 (two) times daily with a meal.   60 tablet   5     NEW DOSE, WILL CALL WHEN NEEDS REFILLED   . Cholecalciferol (VITAMIN D PO)   Oral   Take 5,000 Units by mouth daily. Taking 5000 daily         . COMBIGAN 0.2-0.5 % ophthalmic solution   Right Eye   Place 1 drop into the right eye daily.          . feeding supplement, ENSURE ENLIVE, (ENSURE ENLIVE) LIQD   Oral   Take 237 mLs by mouth 2 (two) times daily between meals.         . furosemide (LASIX) 40 MG tablet   Oral   Take 1 tablet (40 mg total) by mouth 2 (two) times daily.         Marland Kitchen glucose blood (ACCU-CHEK AVIVA PLUS) test strip      Use as instructed to check blood sugar 2 times per day dx code E11.65   100 each   3   . insulin aspart (NOVOLOG FLEXPEN) 100 UNIT/ML FlexPen   Subcutaneous   Inject 0-9 Units into the skin 3 (three) times daily with meals. CBG < 70: implement hypoglycemia protocol CBG 70 - 120: 0 units CBG 121 - 150: 1 unit CBG 151 - 200: 2 units CBG 201 - 250: 3 units CBG 251 - 300: 5 units CBG 301 - 350: 7 units CBG 351 - 400: 9 units CBG > 400: call MD.         . Insulin Detemir (LEVEMIR) 100 UNIT/ML Pen   Subcutaneous   Inject 6 Units into the skin daily at 10 pm.         . Insulin Pen Needle (B-D UF III MINI PEN NEEDLES) 31G X 5 MM MISC   Does not apply   1 each by Does not apply route 2  (two) times daily.   100 each   11   . megestrol (MEGACE) 400 MG/10ML suspension   Oral   Take 10 mLs (400 mg total) by mouth 2 (two)  times daily.   240 mL   0   . metroNIDAZOLE (FLAGYL) 500 MG tablet   Oral   Take 1 tablet (500 mg total) by mouth every 8 (eight) hours. DC after 07/18/2015 doses.         Marland Kitchen ondansetron (ZOFRAN) 4 MG tablet   Oral   Take 1 tablet (4 mg total) by mouth every 8 (eight) hours as needed for nausea or vomiting.   20 tablet   0   . prednisoLONE acetate (PRED FORTE) 1 % ophthalmic suspension   Both Eyes   Place 1 drop into both eyes 2 (two) times daily.          Marland Kitchen saccharomyces boulardii (FLORASTOR) 250 MG capsule   Oral   Take 1 capsule (250 mg total) by mouth 2 (two) times daily.         . tamsulosin (FLOMAX) 0.4 MG CAPS capsule   Oral   Take 0.4 mg by mouth daily.          . vancomycin (VANCOCIN) 50 mg/mL oral solution   Oral   Take 10 mLs (500 mg total) by mouth every 6 (six) hours. DC after 07/18/2015 doses.         . vitamin B-12 (CYANOCOBALAMIN) 1000 MCG tablet   Oral   Take 1,000 mcg by mouth daily.           Allergies Review of patient's allergies indicates no known allergies.  Family History  Problem Relation Age of Onset  . Cancer Neg Hx   . Kidney failure Brother   . Kidney failure Sister     Social History Social History  Substance Use Topics  . Smoking status: Former Smoker -- 0.25 packs/day for 20 years    Types: Cigarettes    Quit date: 07/12/1979  . Smokeless tobacco: Never Used  . Alcohol Use: No    Review of Systems Constitutional: No fever/chills Eyes: No visual changes. ENT: No sore throat. Cardiovascular: Denies chest pain. Respiratory: Denies shortness of breath. Gastrointestinal: No abdominal pain.  No nausea, no vomiting.   diarrhea.  No constipation. Genitourinary: Negative for dysuria. Musculoskeletal: Negative for back pain. Skin: Negative for rash. Neurological: Negative for  headaches, focal weakness or numbness.  10-point ROS otherwise negative.  ____________________________________________   PHYSICAL EXAM:  VITAL SIGNS: ED Triage Vitals  Enc Vitals Group     BP --      Pulse --      Resp --      Temp --      Temp src --      SpO2 --      Weight --      Height --      Head Cir --      Peak Flow --      Pain Score --      Pain Loc --      Pain Edu? --      Excl. in [place]? --     Constitutional: Alert and oriented. Well appearing and in no acute distress. Eyes: Conjunctivae are normal. PERRL. EOMI. Head: Atraumatic. Nose: No congestion/rhinnorhea. Mouth/Throat: Mucous membranes are moist.  Oropharynx non-erythematous. Neck: No stridor.  Cardiovascular: Normal rate, regular rhythm. Grossly normal heart sounds.  Good peripheral circulation. Respiratory: Normal respiratory effort.  No retractions. Lungs CTAB. Gastrointestinal: Soft and nontender. No distention. No abdominal bruits. No CVA tenderness. Musculoskeletal: No lower extremity tenderness nor edema.  No joint effusions. Rectal: Patient has light yellow diarrhea  present. Rectal exam is nontender there are no masses palpated patient is Hemoccult positive. Neurologic:  Normal speech and language. No gross focal neurologic deficits are appreciated. No gait instability. Skin:  Skin is warm, dry and intact. No rash noted. Psychiatric: Mood and affect are normal. Speech and behavior are normal.  ____________________________________________   LABS (all labs ordered are listed, but only abnormal results are displayed)  Labs Reviewed - No data to display ____________________________________________  EKG  KG read and interpreted by me shows sinus tachycardia 108 normal axis nonspecific ST-T wave changes. ____________________________________________  RADIOLOGY   ____________________________________________   PROCEDURES  Patient has had 2 L of IV fluid. He is no longer lightheaded. His  H&H has not changed dramatically. I will let him go home and follow-up with his doctor.  ____________________________________________   INITIAL IMPRESSION / [place] / ED COURSE  Pertinent labs & imaging results that were available during my care of the patient were reviewed by me and considered in my medical decision making (see chart for details).   ____________________________________________   FINAL CLINICAL IMPRESSION(S) / ED DIAGNOSES  Final diagnoses:  None      Nena Polio, MD 07/24/15 2358

## 2015-07-24 NOTE — Telephone Encounter (Signed)
Daughter called- Does Dr Alen Blew feel the anemia is related to the cancer?  Colonoscopy/endo both negative. Cannot find where blood is going. Hgb 7.2 yesterday at rehab center.  Not currently taking zytiga- does he need to restart this? Pneumonia has cleared, hernia repair complete, will complete antibiotics next week for C-diff.

## 2015-07-27 ENCOUNTER — Encounter: Payer: Self-pay | Admitting: Physician Assistant

## 2015-07-27 ENCOUNTER — Ambulatory Visit (INDEPENDENT_AMBULATORY_CARE_PROVIDER_SITE_OTHER): Payer: Medicare Other | Admitting: Physician Assistant

## 2015-07-27 VITALS — BP 90/50 | HR 105 | Wt 216.0 lb

## 2015-07-27 DIAGNOSIS — I959 Hypotension, unspecified: Secondary | ICD-10-CM

## 2015-07-27 DIAGNOSIS — R778 Other specified abnormalities of plasma proteins: Secondary | ICD-10-CM

## 2015-07-27 DIAGNOSIS — E8809 Other disorders of plasma-protein metabolism, not elsewhere classified: Secondary | ICD-10-CM | POA: Diagnosis not present

## 2015-07-27 DIAGNOSIS — D649 Anemia, unspecified: Secondary | ICD-10-CM

## 2015-07-27 DIAGNOSIS — I5032 Chronic diastolic (congestive) heart failure: Secondary | ICD-10-CM

## 2015-07-27 DIAGNOSIS — R7989 Other specified abnormal findings of blood chemistry: Secondary | ICD-10-CM | POA: Diagnosis not present

## 2015-07-27 DIAGNOSIS — R000 Tachycardia, unspecified: Secondary | ICD-10-CM

## 2015-07-27 DIAGNOSIS — I4581 Long QT syndrome: Secondary | ICD-10-CM

## 2015-07-27 DIAGNOSIS — R9431 Abnormal electrocardiogram [ECG] [EKG]: Secondary | ICD-10-CM

## 2015-07-27 LAB — BASIC METABOLIC PANEL
BUN: 24 mg/dL (ref 7–25)
CALCIUM: 6.8 mg/dL — AB (ref 8.6–10.3)
CO2: 21 mmol/L (ref 20–31)
CREATININE: 0.83 mg/dL (ref 0.70–1.18)
Chloride: 102 mmol/L (ref 98–110)
GLUCOSE: 369 mg/dL — AB (ref 65–99)
Potassium: 3.7 mmol/L (ref 3.5–5.3)
SODIUM: 133 mmol/L — AB (ref 135–146)

## 2015-07-27 LAB — MAGNESIUM: MAGNESIUM: 1.8 mg/dL (ref 1.5–2.5)

## 2015-07-27 MED ORDER — METOPROLOL TARTRATE 50 MG PO TABS: 50.0000 mg | ORAL_TABLET | Freq: Two times a day (BID) | ORAL | Status: AC

## 2015-07-27 NOTE — Patient Instructions (Addendum)
Medication Instructions:  STOP Zofran STOP Carvedilol START Metoprolol 50 mg - take 1 tablet (50 mg total) by mouth twice daily  Labwork: Your physician recommends that you return for lab work TODAY.  Testing/Procedures: NONE  Follow-Up: Melina Copa, PA-C, recommends that you schedule a follow-up appointment in 3 months with Dr Mare Ferrari designee.  If you need a refill on your cardiac medications before your next appointment, please call your pharmacy.

## 2015-07-27 NOTE — Addendum Note (Signed)
Addended by: Eulis Foster on: 07/27/2015 02:56 PM   Modules accepted: Orders

## 2015-07-27 NOTE — Progress Notes (Signed)
Cardiology Office Note Date:  07/27/2015  Patient ID:  Jeremy Jennings, DOB 07/14/1935, MRN [id number]  Cardiologist:  Dr. Mare Ferrari   Chief Complaint: f/u CHF  History of Present Illness: [name] is a 80 y.o. male with history of stage 4 prostate CA, HTN, DM, recent GIB, chronic-appearing sinus tach (with PACs, PVCs), chronic diastolic CHF, hypertensive heart disease, protein calorie malnutrition, anemia, hypoalbuminemia contributing to significant edema, and recent prolonged admissions (including PEA arrest 06/2015) who presents for follow-up.  He had a complicated end of the year in 2016. In 06/2015 he was admitted 12/1-12/23/16 with SBO and underwent ex-lap/LOA [id number], initially complicated by post-op ileus, fever, enterococcal UTI, sinus tachycardia. LE duplex/CTA 12/9 neg for DVT/PE. 2D Echo 06/19/15: mod LVH, mid LV cavity systolic obliteration with increased mid-cavitary gradient, EF 65%, PASP 34. He developed ventilator dependent respiratory failure on 06/23/15 a/w bradycardic to PEA arrest - time to ROSC 6 minutes, received 2 amps of epinephrine - felt to be primary respiratory event. Has also had acute encephalopathy with periodic confusion. Cardiology followed him for a/c diastolic CHF and chronic sinus tach. Cardiology followed for acute on chronic diastolic CHF and sinus tach.   He was readmitted 07/04/15-07/17/15 with fever, diarrhea, and septic shock due to C diff colitis. During admission he had acute hypoxic respiratory failure due to acute on chronic diastolic CHF, electrolyte abnormalities (hypokalemia, hypocalcemia, hypomagnesemia and hypophosphatemia), and protein calorie malnutrition. He had an elevated troponin felt due to demand ischemia (only 0.16) - no invasive workup was recommended. EKG was without acute changes and he denied any anginal symptoms. Discharge weight on 1/5 was 189lb (with goal weight <200lb). He was seen back in the ER 07/24/15 for anemia and diarrhea.  Hgb was 7.0 (7-8 range during recent admission), troponin negative, Cr 0.95, albumin 1.7. (Note during recent hospital stays his low albumin was felt to be contributing to LEE.)  He comes back in today for follow-up. Overall he surprisingly says he feels like he is doing well. He does get tired and dyspneic with exertion but is slowly building up endurance. He is doing PT at rehab 5 days a week. He denies any CP. LEE is much improved. Appetite is still a big issue. Diarrhea pretty much resolved. No syncope or dizziness. BP remains low - recheck 93/45.   Past Medical History  Diagnosis Date  . Essential hypertension   . Type 2 diabetes mellitus ([place])   . Hyperlipidemia   . Prostate cancer ([place])     Stage 4  . History of GI bleed   . Sinus tachycardia ([facility])     Chronic  . PAC (premature atrial contraction)     Frequent  . PVC's (premature ventricular contractions)   . Chronic diastolic CHF (congestive heart failure) ([place])   . Hypertensive heart disease   . Protein calorie malnutrition ([place])   . Anemia   . SBO (small bowel obstruction) ([place])     a. 06/2015: prolonged admissions for SBO s/p exlap/LOA c/b post-op ileus, fever, enterococcal UTI, a/c dCHF, acute encephalopathy, anemia, and readmission for septic shock due to C diff colitis.  Marland Kitchen PEA (Pulseless electrical activity) ([place])     a. 06/2015: in setting of acute respiratory failure, felt to be primary resp event.  . C. difficile colitis     a. 06/2015: a/w septic shock.  . Hypoalbuminemia     Past Surgical History  Procedure Laterality Date  . Tonsillectomy    . Hemorroidectomy  1969  .  Esophagogastroduodenoscopy N/A 05/28/2015    Procedure: ESOPHAGOGASTRODUODENOSCOPY (EGD);  Surgeon: [name], MD;  Location: [name];  Service: Endoscopy;  Laterality: N/A;  . Inguinal hernia repair Bilateral AFE 16-17  . Hernia repair  1981    DR Bonnie  . Laparotomy N/A 06/12/2015    Procedure: EXPLORATORY LAPAROTOMY;  Surgeon:  [name], MD;  Location: [facility];  Service: General;  Laterality: N/A;  . Bowel resection N/A 06/12/2015    Procedure: SMALL BOWEL RESECTION;  Surgeon: [name], MD;  Location: [facility];  Service: General;  Laterality: N/A;    Current Outpatient Prescriptions  Medication Sig Dispense Refill  . acetaminophen (TYLENOL) 325 MG tablet Take 650 mg by mouth every 6 (six) hours as needed for mild pain or moderate pain.    Marland Kitchen atorvastatin (LIPITOR) 10 MG tablet Take 10 mg by mouth daily.    . carvedilol (COREG) 12.5 MG tablet Take 1 tablet (12.5 mg total) by mouth 2 (two) times daily with a meal. 60 tablet 5  . Cholecalciferol (VITAMIN D3) 5000 units TABS Take 5,000 Units by mouth daily.    . COMBIGAN 0.2-0.5 % ophthalmic solution Place 1 drop into the right eye daily.     . feeding supplement, ENSURE ENLIVE, (ENSURE ENLIVE) LIQD Take 237 mLs by mouth 2 (two) times daily between meals.    . furosemide (LASIX) 40 MG tablet Take 1 tablet (40 mg total) by mouth 2 (two) times daily.    . insulin aspart (NOVOLOG) 100 UNIT/ML injection Inject 0-9 Units into the skin 3 (three) times daily with meals as needed for high blood sugar. Pt uses per sliding scale:    70-150:  0 units  151-200:  1 unit  201-250:  2 units  251-300:  3 units  301-350:  5 units  351-400:  7 units  401-500:  9 units and notify MD    . insulin detemir (LEVEMIR) 100 UNIT/ML injection Inject 6 Units into the skin at bedtime.    . megestrol (MEGACE) 400 MG/10ML suspension Take 10 mLs (400 mg total) by mouth 2 (two) times daily. 240 mL 0  . metroNIDAZOLE (FLAGYL) 500 MG tablet Take 1 tablet (500 mg total) by mouth every 8 (eight) hours. DC after 07/18/2015 doses.    Marland Kitchen ondansetron (ZOFRAN) 4 MG tablet Take 1 tablet (4 mg total) by mouth every 8 (eight) hours as needed for nausea or vomiting. 20 tablet 0  . prednisoLONE acetate (PRED FORTE) 1 % ophthalmic suspension Place 1 drop into both eyes 2 (two) times daily.     Marland Kitchen  saccharomyces boulardii (FLORASTOR) 250 MG capsule Take 1 capsule (250 mg total) by mouth 2 (two) times daily.    . tamsulosin (FLOMAX) 0.4 MG CAPS capsule Take 0.4 mg by mouth daily.     . vancomycin (VANCOCIN) 50 mg/mL oral solution Take 10 mLs (500 mg total) by mouth every 6 (six) hours. DC after 07/18/2015 doses.    . vitamin B-12 (CYANOCOBALAMIN) 1000 MCG tablet Take 1,000 mcg by mouth daily.    Marland Kitchen abiraterone Acetate (ZYTIGA) 250 MG tablet Take 1,000 mg by mouth daily. Reported on 07/27/2015     No current facility-administered medications for this visit.    Allergies:   Review of patient's allergies indicates no known allergies.   Social History:  The patient  reports that he quit smoking about 36 years ago. His smoking use included Cigarettes. He has a 5 pack-year smoking history. He has never used smokeless tobacco.  He reports that he does not drink alcohol or use illicit drugs.   Family History:  The patient's family history includes Kidney failure in his brother and sister. There is no history of Cancer.  ROS:  Please see the history of present illness.    All other systems are reviewed and otherwise negative.   PHYSICAL EXAM:  VS:  BP 90/50 mmHg  Pulse 105  Wt 216 lb (97.977 kg) BMI: Body mass index is 30.14 kg/(m^2). Well developed but frail chronically ill appearing AAM in no acute distress HEENT: normocephalic, atraumatic Neck: no JVD, carotid bruits or masses Cardiac:  normal S1, S2; RRR; no murmurs, rubs, or gallops Lungs:  clear to auscultation bilaterally, no wheezing, rhonchi or rales Abd: soft, nontender, no hepatomegaly, + BS MS: no deformity or atrophy Ext: no significant LE edema Skin: warm and dry, no rash Neuro:  moves all extremities spontaneously, no focal abnormalities noted, follows commands Psych: euthymic mood, full affect   EKG:  Done today shows sinus tach 105bpm, one PVC, nonspecific T wave flattening, QT prolonged at 547ms  Recent  Labs: 05/28/2015: TSH 2.837 07/05/2015: B Natriuretic Peptide 348.0* 07/17/2015: Magnesium 1.9 07/24/2015: ALT 18; BUN 28*; Creatinine, Ser 0.95; Hemoglobin 7.0*; Platelets 223; Potassium 3.6; Sodium 136  12/10/2014: Cholesterol 167; HDL 37.50*; LDL Cholesterol 108*; Total CHOL/HDL Ratio 4; VLDL 21.8 06/29/2015: Triglycerides 161*   Estimated Creatinine Clearance: 77.7 mL/min (by C-G formula based on Cr of 0.95).   Wt Readings from Last 3 Encounters:  07/27/15 216 lb (97.977 kg)  07/24/15 216 lb (97.977 kg)  07/16/15 189 lb 6 oz (85.9 kg)     Other studies reviewed: Additional studies/records reviewed today include: summarized above  ASSESSMENT AND PLAN:  1. Chronic diastolic CHF - weight is up since discharge but clinically he does not appear significantly volume overloaded. Weight is closer to what it was in the fall before he got sick. His LE edema is near non-existent. Blood pressure is depressed. Will change carvedilol to equivalent dose of metoprolol as metoprolol should help with HR but with less effect on lowering blood pressure. Will check labs today to assess his hypotension - if there is evidence of dehydration I may consider actually cutting back on his Lasix. 2. Hypoalbuminemia - likely contributing to the edema he previously experienced. Needs attention to protein at mealtime. He says he is doing [facility] at [facility]. May need adjustment to SNF diet to increase protein; will defer to primary team. 3. Elevated troponin - no recent chest pain. Felt secondary to demand ischemia in the face of numerous comorbidities. 4. Anemia - given hypotension today will f/u CBC. This should be followed closely by his primary care provider/nursing home provider. 5. Sinus tachycardia - chronic, at baseline. 6. Prolonged QT inteval - this has been present since 06/2015. The only medication I see that might contribute is Zofran but he denies any nausea and says he hasn't required any recent doses. Will check  BMET and Mg today. D/c Zofran from med list as a precaution.   Disposition: F/u with Dr. Sherryl Barters designee (upon his retirement) in 3 months.  Current medicines are reviewed at length with the patient today.  The patient did not have any concerns regarding medicines.  Raechel Ache PA-C 07/27/2015 2:14 PM     [address] [place] [address] [phone] (office)  [phone] (fax)

## 2015-07-27 NOTE — Addendum Note (Signed)
Addended by: Eulis Foster on: 07/27/2015 02:57 PM   Modules accepted: Orders

## 2015-07-28 ENCOUNTER — Telehealth: Payer: Self-pay | Admitting: Oncology

## 2015-07-28 ENCOUNTER — Encounter: Payer: Self-pay | Admitting: Internal Medicine

## 2015-07-28 ENCOUNTER — Observation Stay
Admission: AD | Admit: 2015-07-28 | Discharge: 2015-07-29 | Disposition: A | Discharge status: Skilled Nursing Facility | Payer: Medicare Other | Source: Ambulatory Visit | Attending: Internal Medicine | Admitting: Internal Medicine

## 2015-07-28 ENCOUNTER — Encounter: Payer: Self-pay | Admitting: *Deleted

## 2015-07-28 DIAGNOSIS — E119 Type 2 diabetes mellitus without complications: Secondary | ICD-10-CM | POA: Diagnosis not present

## 2015-07-28 DIAGNOSIS — Z79899 Other long term (current) drug therapy: Secondary | ICD-10-CM | POA: Insufficient documentation

## 2015-07-28 DIAGNOSIS — I5032 Chronic diastolic (congestive) heart failure: Secondary | ICD-10-CM | POA: Diagnosis not present

## 2015-07-28 DIAGNOSIS — C7951 Secondary malignant neoplasm of bone: Secondary | ICD-10-CM | POA: Diagnosis not present

## 2015-07-28 DIAGNOSIS — Z794 Long term (current) use of insulin: Secondary | ICD-10-CM | POA: Insufficient documentation

## 2015-07-28 DIAGNOSIS — I491 Atrial premature depolarization: Secondary | ICD-10-CM | POA: Diagnosis not present

## 2015-07-28 DIAGNOSIS — Z8674 Personal history of sudden cardiac arrest: Secondary | ICD-10-CM | POA: Insufficient documentation

## 2015-07-28 DIAGNOSIS — D638 Anemia in other chronic diseases classified elsewhere: Secondary | ICD-10-CM | POA: Insufficient documentation

## 2015-07-28 DIAGNOSIS — Z87891 Personal history of nicotine dependence: Secondary | ICD-10-CM | POA: Diagnosis not present

## 2015-07-28 DIAGNOSIS — I110 Hypertensive heart disease with heart failure: Secondary | ICD-10-CM | POA: Diagnosis not present

## 2015-07-28 DIAGNOSIS — R000 Tachycardia, unspecified: Secondary | ICD-10-CM | POA: Insufficient documentation

## 2015-07-28 DIAGNOSIS — Z7952 Long term (current) use of systemic steroids: Secondary | ICD-10-CM | POA: Diagnosis not present

## 2015-07-28 DIAGNOSIS — E785 Hyperlipidemia, unspecified: Secondary | ICD-10-CM | POA: Diagnosis not present

## 2015-07-28 DIAGNOSIS — C61 Malignant neoplasm of prostate: Principal | ICD-10-CM | POA: Insufficient documentation

## 2015-07-28 LAB — CBC WITH DIFFERENTIAL/PLATELET
BASOS PCT: 0 % (ref 0–1)
Basophils Absolute: 0 10*3/uL (ref 0.0–0.1)
EOS ABS: 0 10*3/uL (ref 0.0–0.7)
Eosinophils Relative: 0 % (ref 0–5)
HCT: 20.8 % — ABNORMAL LOW (ref 39.0–52.0)
HEMOGLOBIN: 6.8 g/dL — AB (ref 13.0–17.0)
Lymphocytes Relative: 13 % (ref 12–46)
Lymphs Abs: 1.2 10*3/uL (ref 0.7–4.0)
MCH: 29.1 pg (ref 26.0–34.0)
MCHC: 32.7 g/dL (ref 30.0–36.0)
MCV: 88.9 fL (ref 78.0–100.0)
MONO ABS: 0.7 10*3/uL (ref 0.1–1.0)
MONOS PCT: 7 % (ref 3–12)
MPV: 9.7 fL (ref 8.6–12.4)
NEUTROS ABS: 7.4 10*3/uL (ref 1.7–7.7)
Neutrophils Relative %: 80 % — ABNORMAL HIGH (ref 43–77)
PLATELETS: 253 10*3/uL (ref 150–400)
RBC: 2.34 MIL/uL — AB (ref 4.22–5.81)
RDW: 16.8 % — ABNORMAL HIGH (ref 11.5–15.5)
WBC: 9.3 10*3/uL (ref 4.0–10.5)

## 2015-07-28 LAB — CBC
HCT: 20.4 % — ABNORMAL LOW (ref 40.0–52.0)
HEMOGLOBIN: 6.7 g/dL — AB (ref 13.0–18.0)
MCH: 29 pg (ref 26.0–34.0)
MCHC: 32.7 g/dL (ref 32.0–36.0)
MCV: 88.6 fL (ref 80.0–100.0)
PLATELETS: 215 10*3/uL (ref 150–440)
RBC: 2.3 MIL/uL — AB (ref 4.40–5.90)
RDW: 17.2 % — ABNORMAL HIGH (ref 11.5–14.5)
WBC: 8.1 10*3/uL (ref 3.8–10.6)

## 2015-07-28 LAB — COMPREHENSIVE METABOLIC PANEL
ALK PHOS: 370 U/L — AB (ref 38–126)
ALT: 22 U/L (ref 17–63)
AST: 40 U/L (ref 15–41)
Albumin: 1.6 g/dL — ABNORMAL LOW (ref 3.5–5.0)
Anion gap: 7 (ref 5–15)
BILIRUBIN TOTAL: 0.3 mg/dL (ref 0.3–1.2)
BUN: 24 mg/dL — AB (ref 6–20)
CHLORIDE: 108 mmol/L (ref 101–111)
CO2: 22 mmol/L (ref 22–32)
CREATININE: 0.82 mg/dL (ref 0.61–1.24)
Calcium: 6.6 mg/dL — ABNORMAL LOW (ref 8.9–10.3)
GFR calc Af Amer: >60 mL/min (ref >60)
Glucose, Bld: 217 mg/dL — ABNORMAL HIGH (ref 65–99)
Potassium: 3.4 mmol/L — ABNORMAL LOW (ref 3.5–5.1)
Sodium: 137 mmol/L (ref 135–145)
TOTAL PROTEIN: 6.1 g/dL — AB (ref 6.5–8.1)

## 2015-07-28 LAB — PROTIME-INR
INR: 1.39
PROTHROMBIN TIME: 17.2 s — AB (ref 11.4–15.0)

## 2015-07-28 LAB — RETICULOCYTES
RBC.: 2.3 MIL/uL — AB (ref 4.40–5.90)
Retic Count, Absolute: 29.9 10*3/uL (ref 19.0–183.0)
Retic Ct Pct: 1.3 % (ref 0.4–3.1)

## 2015-07-28 LAB — GLUCOSE, CAPILLARY: Glucose-Capillary: 254 mg/dL — ABNORMAL HIGH (ref 65–99)

## 2015-07-28 LAB — FERRITIN: Ferritin: >7500 ng/mL — ABNORMAL HIGH (ref 24–336)

## 2015-07-28 LAB — MRSA PCR SCREENING: MRSA BY PCR: NEGATIVE

## 2015-07-28 LAB — PREPARE RBC (CROSSMATCH)

## 2015-07-28 MED ORDER — PNEUMOCOCCAL VAC POLYVALENT 25 MCG/0.5ML IJ INJ
0.5000 mL | INJECTION | INTRAMUSCULAR | Status: DC
Start: 2015-07-29 — End: 2015-07-29
  Filled 2015-07-28: qty 0.5

## 2015-07-28 MED ORDER — FUROSEMIDE 10 MG/ML IJ SOLN
40.0000 mg | Freq: Once | INTRAMUSCULAR | Status: AC
  Administered 2015-07-28: 40 mg via INTRAVENOUS
  Filled 2015-07-28: qty 4

## 2015-07-28 MED ORDER — PHYTONADIONE 5 MG PO TABS
10.0000 mg | ORAL_TABLET | Freq: Once | ORAL | Status: AC
  Administered 2015-07-28: 10 mg via ORAL
  Filled 2015-07-28: qty 2

## 2015-07-28 MED ORDER — ACETAMINOPHEN 325 MG PO TABS: 650.0000 mg | ORAL_TABLET | Freq: Four times a day (QID) | ORAL | Status: DC | PRN

## 2015-07-28 MED ORDER — INSULIN DETEMIR 100 UNIT/ML ~~LOC~~ SOLN
6.0000 [IU] | Freq: Every day | SUBCUTANEOUS | Status: DC
  Administered 2015-07-28: 23:00:00 6 [IU] via SUBCUTANEOUS
  Filled 2015-07-28 (×2): qty 0.06

## 2015-07-28 MED ORDER — BRIMONIDINE TARTRATE 0.2 % OP SOLN
1.0000 [drp] | Freq: Every day | OPHTHALMIC | Status: DC
  Administered 2015-07-29: 1 [drp] via OPHTHALMIC
  Filled 2015-07-28: qty 5

## 2015-07-28 MED ORDER — INSULIN ASPART 100 UNIT/ML ~~LOC~~ SOLN
0.0000 [IU] | Freq: Three times a day (TID) | SUBCUTANEOUS | Status: DC
  Administered 2015-07-29: 10:00:00 5 [IU] via SUBCUTANEOUS
  Filled 2015-07-28: qty 3
  Filled 2015-07-28: qty 2

## 2015-07-28 MED ORDER — VITAMIN B-12 1000 MCG PO TABS
1000.0000 ug | ORAL_TABLET | Freq: Every day | ORAL | Status: DC
  Administered 2015-07-29: 1000 ug via ORAL
  Filled 2015-07-28: qty 1

## 2015-07-28 MED ORDER — RISAQUAD PO CAPS
1.0000 | ORAL_CAPSULE | Freq: Two times a day (BID) | ORAL | Status: DC
  Administered 2015-07-29: 1 via ORAL
  Filled 2015-07-28: qty 1

## 2015-07-28 MED ORDER — FUROSEMIDE 40 MG PO TABS
40.0000 mg | ORAL_TABLET | Freq: Two times a day (BID) | ORAL | Status: DC
  Administered 2015-07-29: 10:00:00 40 mg via ORAL
  Filled 2015-07-28: qty 1

## 2015-07-28 MED ORDER — ENSURE ENLIVE PO LIQD
237.0000 mL | Freq: Two times a day (BID) | ORAL | Status: DC
  Administered 2015-07-29 (×2): 237 mL via ORAL

## 2015-07-28 MED ORDER — METOPROLOL TARTRATE 50 MG PO TABS
50.0000 mg | ORAL_TABLET | Freq: Two times a day (BID) | ORAL | Status: DC
  Administered 2015-07-29: 50 mg via ORAL
  Filled 2015-07-28: qty 1

## 2015-07-28 MED ORDER — SODIUM CHLORIDE 0.9 % IV SOLN
Freq: Once | INTRAVENOUS | Status: AC
  Administered 2015-07-28: 20:00:00 via INTRAVENOUS

## 2015-07-28 MED ORDER — INSULIN ASPART 100 UNIT/ML ~~LOC~~ SOLN
0.0000 [IU] | Freq: Every day | SUBCUTANEOUS | Status: DC
  Administered 2015-07-28: 3 [IU] via SUBCUTANEOUS
  Filled 2015-07-28: qty 3

## 2015-07-28 MED ORDER — PREDNISOLONE ACETATE 1 % OP SUSP
1.0000 [drp] | Freq: Two times a day (BID) | OPHTHALMIC | Status: DC
  Administered 2015-07-29: 1 [drp] via OPHTHALMIC
  Filled 2015-07-28: qty 1

## 2015-07-28 MED ORDER — FUROSEMIDE 10 MG/ML IJ SOLN
40.0000 mg | Freq: Once | INTRAMUSCULAR | Status: AC
  Administered 2015-07-29: 40 mg via INTRAVENOUS
  Filled 2015-07-28: qty 4

## 2015-07-28 MED ORDER — ATORVASTATIN CALCIUM 10 MG PO TABS
10.0000 mg | ORAL_TABLET | Freq: Every day | ORAL | Status: DC
  Administered 2015-07-29: 10 mg via ORAL
  Filled 2015-07-28: qty 1

## 2015-07-28 MED ORDER — TIMOLOL MALEATE 0.5 % OP SOLN
1.0000 [drp] | Freq: Every day | OPHTHALMIC | Status: DC
  Administered 2015-07-29: 10:00:00 1 [drp] via OPHTHALMIC
  Filled 2015-07-28: qty 5

## 2015-07-28 MED ORDER — TAMSULOSIN HCL 0.4 MG PO CAPS
0.4000 mg | ORAL_CAPSULE | Freq: Every day | ORAL | Status: DC
  Administered 2015-07-29: 0.4 mg via ORAL
  Filled 2015-07-28: qty 1

## 2015-07-28 MED ORDER — VITAMIN D 1000 UNITS PO TABS
5000.0000 [IU] | ORAL_TABLET | Freq: Every day | ORAL | Status: DC
  Administered 2015-07-29: 10:00:00 5000 [IU] via ORAL
  Filled 2015-07-28: qty 5

## 2015-07-28 MED ORDER — ACETAMINOPHEN 650 MG RE SUPP: 650.0000 mg | Freq: Four times a day (QID) | RECTAL | Status: DC | PRN

## 2015-07-28 MED ORDER — BRIMONIDINE TARTRATE-TIMOLOL 0.2-0.5 % OP SOLN: 1.0000 [drp] | Freq: Every day | OPHTHALMIC | Status: DC

## 2015-07-28 NOTE — Telephone Encounter (Signed)
pt cld & left a voicemail no detailed message-Cld pt back and left message of time & date of appt-adv to call us back @ [phone] for any addtl info or help

## 2015-07-28 NOTE — H&P (Signed)
[facility] [place] [facility] [place] NAME: Jeremy Jennings    MR#:  [id number]  DATE OF BIRTH:  04-04-1936  DATE OF ADMISSION:  07/28/2015  PRIMARY CARE PHYSICIAN: [name], MD   REQUESTING/REFERRING PHYSICIAN: [place]:  Sent in for transfusion with a hemoglobin of 6.8  HISTORY OF PRESENT ILLNESS:  Jeremy Jennings  is a 80 y.o. male with a known history of being in and out of the hospital since December. He stated he had a colonoscopy December 1 that was negative and endoscopy that showed a little inflammation. He has soft bowel movements and had recent C. difficile colitis. He does not see any blood in his bowel movements. The patient does not complain of any chest pain or shortness of breath but is fatigued. Patient was sent in for direct admission for blood transfusion.  PAST MEDICAL HISTORY:   Past Medical History  Diagnosis Date  . Essential hypertension   . Type 2 diabetes mellitus ([place])   . Hyperlipidemia   . Prostate cancer ([place])     Stage 4  . History of GI bleed   . Sinus tachycardia (H[facility])     Chronic  . PAC (premature atrial contraction)     Frequent  . PVC's (premature ventricular contractions)   . Chronic diastolic CHF (congestive heart failure) ([place])   . Hypertensive heart disease   . Protein calorie malnutrition ([place])   . Anemia   . SBO (small bowel obstruction) ([place])     a. 06/2015: prolonged admissions for SBO s/p exlap/LOA c/b post-op ileus, fever, enterococcal UTI, a/c dCHF, acute encephalopathy, anemia, and readmission for septic shock due to C diff colitis.  Marland Kitchen PEA (Pulseless electrical activity) ([place])     a. 06/2015: in setting of acute respiratory failure, felt to be primary resp event.  . C. difficile colitis     a. 06/2015: a/w septic shock.  . Hypoalbuminemia     PAST SURGICAL HISTORY:   Past Surgical History  Procedure Laterality Date  . Tonsillectomy    . Hemorroidectomy  1969  .  Esophagogastroduodenoscopy N/A 05/28/2015    Procedure: ESOPHAGOGASTRODUODENOSCOPY (EGD);  Surgeon: [name], MD;  Location: [name];  Service: Endoscopy;  Laterality: N/A;  . Inguinal hernia repair Bilateral AFE 16-17  . Hernia repair  1981    [facility]  . Laparotomy N/A 06/12/2015    Procedure: EXPLORATORY LAPAROTOMY;  Surgeon: [name], MD;  Location: [facility];  Service: General;  Laterality: N/A;  . Bowel resection N/A 06/12/2015    Procedure: SMALL BOWEL RESECTION;  Surgeon: [name], MD;  Location: [facility];  Service: General;  Laterality: N/A;    SOCIAL HISTORY:   Social History  Substance Use Topics  . Smoking status: Former Smoker -- 0.25 packs/day for 20 years    Types: Cigarettes    Quit date: 07/12/1979  . Smokeless tobacco: Never Used  . Alcohol Use: No    FAMILY HISTORY:   Family History  Problem Relation Age of Onset  . Cancer Neg Hx   . Heart attack Neg Hx     unknown  . Stroke Neg Hx     unknown  . Kidney failure Brother   . Kidney failure Sister   . Diabetes Mother   . Hypertension Father     DRUG ALLERGIES:  No Known Allergies  REVIEW OF SYSTEMS:  CONSTITUTIONAL: No fever. Positive for fatigue and weakness.  EYES: No blurred or  double vision.  EARS, NOSE, AND THROAT: No tinnitus or ear pain. Positive for sore throat. RESPIRATORY: Occasional cough. No shortness of breath, wheezing or hemoptysis.  CARDIOVASCULAR: No chest pain, orthopnea, edema.  GASTROINTESTINAL: No nausea, vomiting, or abdominal pain. No blood in bowel movements. Some soft stools GENITOURINARY: No dysuria, hematuria.  ENDOCRINE: No polyuria, nocturia,  HEMATOLOGY: History of anemia, no easy bruising or bleeding SKIN: No rash or lesion. MUSCULOSKELETAL:  knee pain NEUROLOGIC: No tingling, numbness, weakness.  PSYCHIATRY: No anxiety or depression.   MEDICATIONS AT HOME:   Prior to Admission medications   Medication Sig Start Date End Date Taking?  Authorizing Provider  acetaminophen (TYLENOL) 325 MG tablet Take 650 mg by mouth every 6 (six) hours as needed for mild pain or moderate pain.   Yes Historical Provider, MD  atorvastatin (LIPITOR) 10 MG tablet Take 10 mg by mouth daily.   Yes Historical Provider, MD  Cholecalciferol (VITAMIN D3) 5000 units TABS Take 5,000 Units by mouth daily.   Yes Historical Provider, MD  COMBIGAN 0.2-0.5 % ophthalmic solution Place 1 drop into the right eye daily.    Yes Historical Provider, MD  feeding supplement, ENSURE ENLIVE, (ENSURE ENLIVE) LIQD Take 237 mLs by mouth 2 (two) times daily between meals. 07/17/15  Yes Modena Jansky, MD  furosemide (LASIX) 40 MG tablet Take 1 tablet (40 mg total) by mouth 2 (two) times daily. 07/17/15  Yes Modena Jansky, MD  insulin aspart (NOVOLOG) 100 UNIT/ML injection Inject 0-9 Units into the skin 3 (three) times daily with meals as needed for high blood sugar. Pt uses per sliding scale:    70-150:  0 units  151-200:  1 unit  201-250:  2 units  251-300:  3 units  301-350:  5 units  351-400:  7 units  401-500:  9 units and notify MD   Yes Historical Provider, MD  insulin detemir (LEVEMIR) 100 UNIT/ML injection Inject 6 Units into the skin at bedtime.   Yes Historical Provider, MD  megestrol (MEGACE) 400 MG/10ML suspension Take 10 mLs (400 mg total) by mouth 2 (two) times daily. 05/18/15  Yes Wyatt Portela, MD  metoprolol (LOPRESSOR) 50 MG tablet Take 1 tablet (50 mg total) by mouth 2 (two) times daily. 07/27/15  Yes Dayna N Dunn, PA-C  prednisoLONE acetate (PRED FORTE) 1 % ophthalmic suspension Place 1 drop into both eyes 2 (two) times daily.  04/25/13  Yes Historical Provider, MD  saccharomyces boulardii (FLORASTOR) 250 MG capsule Take 1 capsule (250 mg total) by mouth 2 (two) times daily. 07/17/15  Yes Modena Jansky, MD  tamsulosin (FLOMAX) 0.4 MG CAPS capsule Take 0.4 mg by mouth daily.    Yes Historical Provider, MD  vitamin B-12 (CYANOCOBALAMIN) 1000 MCG tablet  Take 1,000 mcg by mouth daily.   Yes Historical Provider, MD  abiraterone Acetate (ZYTIGA) 250 MG tablet Take 1,000 mg by mouth daily. Reported on 07/27/2015    Historical Provider, MD      VITAL SIGNS:  Blood pressure 136/56, pulse 105, temperature 97.7 F (36.5 C), temperature source Oral, resp. rate 20, SpO2 100 %.  PHYSICAL EXAMINATION:  GENERAL:  80 y.o.-year-old patient lying in the bed with no acute distress.  EYES: Pupils equal, round, reactive to light and accommodation. No scleral icterus. Extraocular muscles intact.  conjunctiva pale  HEENT: Head atraumatic, normocephalic. Oropharynx and nasopharynx clear.  NECK:  Supple, no jugular venous distention. No thyroid enlargement, no tenderness.  LUNGS: Normal breath sounds  bilaterally, no wheezing, rales,rhonchi or crepitation. No use of accessory muscles of respiration.  CARDIOVASCULAR: S1, S2 normal. No murmurs, rubs, or gallops.  ABDOMEN: Soft, nontender, nondistended. Bowel sounds present. No organomegaly or mass.  EXTREMITIES: trace  pedal edema,  no cyanosis, or clubbing.  NEUROLOGIC: Cranial nerves II through XII are intact. Muscle strength 5/5 in all extremities. Sensation intact. Gait not checked.  PSYCHIATRIC: The patient is alert and oriented x 3.  SKIN: No rash, lesion, or ulcer.   LABORATORY PANEL:   CBC  Recent Labs Lab 07/28/15 1723  WBC 8.1  HGB 6.7*  HCT 20.4*  PLT 215   ------------------------------------------------------------------------------------------------------------------  Chemistries   Recent Labs Lab 07/24/15 1411 07/27/15 1457  NA 136 133*  K 3.6 3.7  CL 105 102  CO2 24 21  GLUCOSE 363* 369*  BUN 28* 24  CREATININE 0.95 0.83  CALCIUM 6.5* 6.8*  MG  --  1.8  AST 52*  --   ALT 18  --   ALKPHOS 393*  --   BILITOT 0.8  --    ------------------------------------------------------------------------------------------------------------------  Cardiac Enzymes  Recent Labs Lab  07/24/15 1411  TROPONINI <0.03   ------------------------------------------------------------------------------------------------------------------  IMPRESSION AND PLAN:   1. Chronic anemia of chronic disease. Transfuse 2 units of packed red blood cells. Check hemoglobin in the morning. If hemoglobin is okay he can be discharged back to his facility. Consider Procrit with his oncologist as outpatient. Looking back at old ferritin level was very high. I will repeat that here just to make sure this is anemia of chronic disease. Stool guaiacs if he has bowel movements. 2. 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 Type 2 diabetes mellitus - on Levemir and sliding scale   4. History of diastolic congestive heart failure- currently no signs. I will give Lasix prior to each unit of blood. 5. Essential hypertension- continue usual medications  All the records are reviewed and case discussed with ED provider. Management plans discussed with the patient, family and they are in agreement.  CODE STATUS: Full code   TOTAL TIME TAKING CARE OF THIS PATIENT:  35minutes.    Loletha Grayer M.D on 07/28/2015 at 7:05 PM  Between 7am to 6pm - Pager - [phone]  After 6pm call admission pager [facility]  Office  [phone]  CC: Primary care physician; [name], MD

## 2015-07-28 NOTE — Telephone Encounter (Signed)
Left message on cell re f/u for 1/19 @ 3:15 pm. Not able to reach patient on home phone or lm.

## 2015-07-28 NOTE — Plan of Care (Signed)
Problem: Tissue Perfusion: Goal: Risk factors for ineffective tissue perfusion will decrease Outcome: Not Progressing Pt in from [facility] via ems  To receive  2 units prbcs for hgb of 6.8

## 2015-07-29 DIAGNOSIS — C61 Malignant neoplasm of prostate: Secondary | ICD-10-CM | POA: Diagnosis not present

## 2015-07-29 LAB — CBC
HEMATOCRIT: 25.4 % — AB (ref 40.0–52.0)
Hemoglobin: 8.6 g/dL — ABNORMAL LOW (ref 13.0–18.0)
MCH: 29.9 pg (ref 26.0–34.0)
MCHC: 33.8 g/dL (ref 32.0–36.0)
MCV: 88.6 fL (ref 80.0–100.0)
Platelets: 200 10*3/uL (ref 150–440)
RBC: 2.87 MIL/uL — ABNORMAL LOW (ref 4.40–5.90)
RDW: 16.5 % — AB (ref 11.5–14.5)
WBC: 9 10*3/uL (ref 3.8–10.6)

## 2015-07-29 LAB — GLUCOSE, CAPILLARY
GLUCOSE-CAPILLARY: 253 mg/dL — AB (ref 65–99)
Glucose-Capillary: 232 mg/dL — ABNORMAL HIGH (ref 65–99)

## 2015-07-29 NOTE — Discharge Summary (Signed)
[place] [facility] [place] NAME: Jeremy Jennings    MR#:  [id number]  DATE OF BIRTH:  10/14/1935  DATE OF ADMISSION:  07/28/2015 ADMITTING PHYSICIAN: Loletha Grayer, MD  DATE OF DISCHARGE: No discharge date for patient encounter.  PRIMARY CARE PHYSICIAN: [name], MD    ADMISSION DIAGNOSIS:  Symptomatic Anemia  DISCHARGE DIAGNOSIS:  Active Problems:   Anemia   SECONDARY DIAGNOSIS:   Past Medical History  Diagnosis Date  . Essential hypertension   . Type 2 diabetes mellitus ([place])   . Hyperlipidemia   . Prostate cancer ([place])     Stage 4  . History of GI bleed   . Sinus tachycardia (HCC)     Chronic  . PAC (premature atrial contraction)     Frequent  . PVC's (premature ventricular contractions)   . Chronic diastolic CHF (congestive heart failure) ([place])   . Hypertensive heart disease   . Protein calorie malnutrition ([place])   . Anemia   . SBO (small bowel obstruction) ([place])     a. 06/2015: prolonged admissions for SBO s/p exlap/LOA c/b post-op ileus, fever, enterococcal UTI, a/c dCHF, acute encephalopathy, anemia, and readmission for septic shock due to C diff colitis.  Marland Kitchen PEA (Pulseless electrical activity) ([place])     a. 06/2015: in setting of acute respiratory failure, felt to be primary resp event.  . C. difficile colitis     a. 06/2015: a/w septic shock.  . Hypoalbuminemia      ADMITTING HISTORY  Jeremy Jennings is a 80 y.o. male with a known history of being in and out of the hospital since December. He stated he had a colonoscopy December 1 that was negative and endoscopy that showed a little inflammation. He has soft bowel movements and had recent C. difficile colitis. He does not see any blood in his bowel movements. The patient does not complain of any chest pain or shortness of breath but is fatigued. Patient was sent in for direct admission for blood transfusion.   HOSPITAL COURSE:   1. Chronic anemia of  chronic disease. Transfused 2 units of packed red blood cells as requested from SNF. Repeat hemoglobin in the morning improved to 8.6.   3. Type 2 diabetes mellitus - on Levemir and sliding scale   4. History of diastolic congestive heart failure- currently no signs. Received Lasix prior to each unit of blood.  5. Essential hypertension- continue usual medications  Stable for discharge.  Needs f/u Hb in 1 week   CONSULTS OBTAINED:     DRUG ALLERGIES:  No Known Allergies  DISCHARGE MEDICATIONS:   Current Discharge Medication List    CONTINUE these medications which have NOT CHANGED   Details  acetaminophen (TYLENOL) 325 MG tablet Take 650 mg by mouth every 6 (six) hours as needed for mild pain or moderate pain.    atorvastatin (LIPITOR) 10 MG tablet Take 10 mg by mouth daily.    Cholecalciferol (VITAMIN D3) 5000 units TABS Take 5,000 Units by mouth daily.    COMBIGAN 0.2-0.5 % ophthalmic solution Place 1 drop into the right eye daily.     feeding supplement, ENSURE ENLIVE, (ENSURE ENLIVE) LIQD Take 237 mLs by mouth 2 (two) times daily between meals.    furosemide (LASIX) 40 MG tablet Take 1 tablet (40 mg total) by mouth 2 (two) times daily.    insulin aspart (NOVOLOG) 100 UNIT/ML injection Inject 0-9 Units into the skin 3 (three) times daily with  meals as needed for high blood sugar. Pt uses per sliding scale:    70-150:  0 units  151-200:  1 unit  201-250:  2 units  251-300:  3 units  301-350:  5 units  351-400:  7 units  401-500:  9 units and notify MD    insulin detemir (LEVEMIR) 100 UNIT/ML injection Inject 6 Units into the skin at bedtime.    megestrol (MEGACE) 400 MG/10ML suspension Take 10 mLs (400 mg total) by mouth 2 (two) times daily. Qty: 240 mL, Refills: 0   Associated Diagnoses: Prostate cancer ([place]); Cancer (HCC)    metoprolol (LOPRESSOR) 50 MG tablet Take 1 tablet (50 mg total) by mouth 2 (two) times daily. Qty: 180 tablet, Refills: 3    Associated Diagnoses: Chronic diastolic CHF (congestive heart failure) ([place]); Hypoalbuminemia; Elevated troponin; Anemia, unspecified; Sinus tachycardia ([place]); Prolonged Q-T interval on ECG; Hypotension, unspecified hypotension type    prednisoLONE acetate (PRED FORTE) 1 % ophthalmic suspension Place 1 drop into both eyes 2 (two) times daily.     saccharomyces boulardii (FLORASTOR) 250 MG capsule Take 1 capsule (250 mg total) by mouth 2 (two) times daily.    tamsulosin (FLOMAX) 0.4 MG CAPS capsule Take 0.4 mg by mouth daily.     vitamin B-12 (CYANOCOBALAMIN) 1000 MCG tablet Take 1,000 mcg by mouth daily.    abiraterone Acetate (ZYTIGA) 250 MG tablet Take 1,000 mg by mouth daily. Reported on 07/27/2015         Today    VITAL SIGNS:  Blood pressure 135/62, pulse 108, temperature 97.9 F (36.6 C), temperature source Oral, resp. rate 16, height 5\' 11"  (1.803 m), weight 97.977 kg (216 lb), SpO2 99 %.  I/O:   Intake/Output Summary (Last 24 hours) at 07/29/15 0934 Last data filed at 07/29/15 0350  Gross per 24 hour  Intake    609 ml  Output    300 ml  Net    309 ml    PHYSICAL EXAMINATION:  Physical Exam  GENERAL:  80 y.o.-year-old patient lying in the bed with no acute distress.  LUNGS: Normal breath sounds bilaterally, no wheezing, rales,rhonchi or crepitation. No use of accessory muscles of respiration.  CARDIOVASCULAR: S1, S2 normal. No murmurs, rubs, or gallops.  NEUROLOGIC: Moves all 4 extremities. PSYCHIATRIC: The patient is alert and awake  DATA REVIEW:   CBC  Recent Labs Lab 07/29/15 0515  WBC 9.0  HGB 8.6*  HCT 25.4*  PLT 200    Chemistries   Recent Labs Lab 07/27/15 1457 07/28/15 1723  NA 133* 137  K 3.7 3.4*  CL 102 108  CO2 21 22  GLUCOSE 369* 217*  BUN 24 24*  CREATININE 0.83 0.82  CALCIUM 6.8* 6.6*  MG 1.8  --   AST  --  40  ALT  --  22  ALKPHOS  --  370*  BILITOT  --  0.3    Cardiac Enzymes  Recent Labs Lab 07/24/15 1411   TROPONINI <0.03    Microbiology Results  Results for orders placed or performed during the hospital encounter of 07/28/15  MRSA PCR Screening     Status: None   Collection Time: 07/28/15  7:20 PM  Result Value Ref Range Status   MRSA by PCR NEGATIVE NEGATIVE Final    Comment:        The GeneXpert MRSA Assay (FDA approved for NASAL specimens only), is one component of a comprehensive MRSA colonization surveillance program. It is not intended to  diagnose MRSA infection nor to guide or monitor treatment for MRSA infections.     RADIOLOGY:  No results found.    Follow up with PCP in 1 week.  Management plans discussed with the patient, family and they are in agreement.  CODE STATUS:     Code Status Orders        Start     Ordered   07/28/15 1811  Full code   Continuous     07/28/15 1811    Code Status History    Date Active Date Inactive Code Status Order ID Comments User Context   07/05/2015  5:29 AM 07/17/2015  6:12 PM Full Code [id number]  Luella Cook, MD ED   06/11/2015  6:10 PM 07/03/2015  6:16 PM Full Code [id number]  Saverio Danker, PA-C Inpatient   05/27/2015  6:22 PM 05/30/2015  3:32 PM DNR [id number]  Florencia Reasons, MD Inpatient   05/27/2015  5:32 PM 05/27/2015  6:22 PM DNR [id number]  Florencia Reasons, MD ED   11/09/2014  4:03 PM 11/11/2014  2:33 PM Full Code [id number]  Robbie Lis, MD Inpatient      TOTAL TIME TAKING CARE OF THIS PATIENT ON DAY OF DISCHARGE: more than 30 minutes.    Hillary Bow R M.D on 07/29/2015 at 9:34 AM  Between 7am to 6pm - Pager - [phone]  After 6pm go to [email or web address] - password [email or web address] [facility]  Office  [phone]  CC: Primary care physician; [name], MD     Note: This dictation was prepared with Dragon dictation along with smaller phrase technology. Any transcriptional errors that result from this process are unintentional.

## 2015-07-29 NOTE — Progress Notes (Signed)
Pt transferred to [facility] by ems per orders

## 2015-07-29 NOTE — Progress Notes (Signed)
Checked to see if pre-authorization would be needed for non-emergent EMS transport.  Per [employer]’s automated system, [phone], patient has a [employer] Medicare Advantage PPO policy.  [employer] Medicare PPO plans do not require pre-auth for non-emergent ground transports using service codes A0426 or A0428.   °

## 2015-07-29 NOTE — Plan of Care (Signed)
Problem: Bowel/Gastric: Goal: Will show no signs and symptoms of gastrointestinal bleeding Outcome: Progressing No signs of active bleeding. Stool pending for occult.   Problem: Education: Goal: Ability to identify signs and symptoms of gastrointestinal bleeding will improve Outcome: Progressing Educational handout given pre transfusion. Pt verbalized understanding.   Problem: Fluid Volume: Goal: Will show no signs and symptoms of excessive bleeding Outcome: Progressing Pt tolerated two units of prbc's for hgb of 6.7. Am labs pending.

## 2015-07-29 NOTE — Clinical Social Work Note (Signed)
Clinical Social Work Assessment  Patient Details  Name: Jeremy Jennings MRN: [id number] Date of Birth: [birth year]  Date of referral:  07/29/15               Reason for consult:  Facility Placement                Permission sought to share information with:  Family Supports Permission granted to share information::  Yes, Verbal Permission Granted  Name::     Jeremy Jennings, daughter [phone])   Housing/Transportation Living arrangements for the past 2 months:  [place] of Information:  Patient, Adult Children Patient Interpreter Needed:  None Criminal Activity/Legal Involvement Pertinent to Current Situation/Hospitalization:  No - Comment as needed Significant Relationships:  None Lives with:  Facility Resident Do you feel safe going back to the place where you live?  Yes Need for family participation in patient care:  Yes (Comment)  Care giving concerns:  No care giving concerns identified   Facilities manager / plan:  CSW met with pt to address consult for pt as he was admitted from [facility]. Pt at facility for STR. CSW introduced herself and explained role of social work. CSW also explained the process of discharging and returning to SNF. Pt is agreeable. CSW also called pt's daughter Jeremy Jennings to give her update. Pt's daughter is agreeable to discharge plan. Facility has received discharge information and is ready to admit pt. RN to call report and EMS to provide transportation. CSW is signing off as no further needs identified.   Employment status:  Retired Nurse, adult PT Recommendations:  Not assessed at this time Information / Referral to community resources:  [place]  Patient/Family's Response to care:  Pt and daughter were appreciative of CSW support.   Patient/Family's Understanding of and Emotional Response to Diagnosis, Current Treatment, and Prognosis:  Pt and daughter understand that pt  should return to SNF for continued STR.   Emotional Assessment Appearance:   Appears stated age Attitude/Demeanor/Rapport:  Other (Appropriate) Affect (typically observed):  Pleasant Orientation:  Oriented to Self, Oriented to Place, Oriented to  Time, Oriented to Situation Alcohol / Substance use:  Never Used Psych involvement (Current and /or in the community):  No (Comment)  Discharge Needs  Concerns to be addressed:  No discharge needs identified Readmission within the last 30 days:  Yes Current discharge risk:  None Barriers to Discharge:  No Barriers Identified   Darden Dates, LCSW 07/29/2015, 12:11 PM

## 2015-07-29 NOTE — Progress Notes (Signed)
Reported called to angela RN at [facility], EMS notified of transport needed.  Donnita [place], RN 07/29/2015 11:35 AM

## 2015-07-29 NOTE — NC FL2 (Signed)
[place] LEVEL OF CARE SCREENING TOOL     IDENTIFICATION  Patient Name: Jeremy Jennings Birthdate: 27-Jan-1936 Sex: male Admission Date (Current Location): 07/28/2015  [place] and [place] Number:  Engineering geologist and Address:  [facility], [address] [place], [place] [address]      Provider Number: [id number]  Attending Physician Name and Address:  Hillary Bow, MD  Relative Name and Phone Number:       Current Level of Care: Hospital Recommended Level of Care: [place] Prior Approval Number:    Date Approved/Denied:   PASRR Number: [id number]  Discharge Plan: SNF    Current Diagnoses: Patient Active Problem List   Diagnosis Date Noted  . Chronic diastolic CHF (congestive heart failure) ([place]) 07/27/2015  . Vitamin D deficiency 07/16/2015  . Hypokalemia   . Hypomagnesemia   . Hypophosphatemia   . C. difficile colitis   . Acute renal failure ([place])   . Elevated troponin I level   . Sepsis ([place]) 07/05/2015  . Septic shock ([place]) 07/05/2015  . Aspiration of vomitus   . Cardiac arrest due to respiratory disorder ([place]) 06/24/2015  . 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 Acute respiratory failure ([place])   . Enterococcus UTI   . HCAP (healthcare-associated pneumonia) 06/19/2015  . Sinus tachycardia ([place]) 06/19/2015  . Pedal edema 06/19/2015  . Generalized weakness 06/19/2015  . Metabolic acidosis 0000000  . Type 2 diabetes mellitus ([place]) 06/19/2015  . SBO (small bowel obstruction) ischemic s/p LOA & SB resection 06/12/2015 06/11/2015  . Acute GI bleeding 05/27/2015  . GI bleed 05/27/2015  . Low hemoglobin 12/10/2014  . Trigger finger of both hands 12/10/2014  . Anemia 11/09/2014  . Diabetes ([place]) 04/21/2014  . Hypotestosteronism 04/21/2014  . Cancer ([place]) 04/21/2014  . Prostate cancer ([place]) 11/20/2013  . Elevated PSA 08/29/2013  . Chronic kidney disease, stage II (mild)  08/08/2013  . BPH associated with nocturia 07/24/2013  . PAC (premature atrial contraction) 07/24/2012  . Bursitis of left shoulder 07/15/2011  . Hyperlipidemia   . Essential hypertension     Orientation RESPIRATION BLADDER Height & Weight    Self, Time, Situation, Place  Normal Continent 5\' 11"  (180.3 cm) 216 lbs.  BEHAVIORAL SYMPTOMS/MOOD NEUROLOGICAL BOWEL NUTRITION STATUS      Continent Diet (2 grams sodium, thin liquids)  AMBULATORY STATUS COMMUNICATION OF NEEDS Skin   Limited Assist Verbally Normal                       Personal Care Assistance Level of Assistance  Bathing, Feeding, Dressing Bathing Assistance: Limited assistance Feeding assistance: Limited assistance Dressing Assistance: Limited assistance     Functional Limitations Info  Sight, Hearing, Speech Sight Info: Adequate Hearing Info: Adequate Speech Info: Adequate    SPECIAL CARE FACTORS FREQUENCY  PT (By licensed PT)     PT Frequency: 5              Contractures      Additional Factors Info  Code Status, Allergies, Insulin Sliding Scale Code Status Info: Full  Code Allergies Info: No known allergies   Insulin Sliding Scale Info: 4 times a day       Current Medications (07/29/2015):  This is the current hospital active medication list Current Facility-Administered Medications  Medication Dose Route Frequency Provider Last Rate Last Dose  . acetaminophen (TYLENOL) tablet 650 mg  650 mg Oral Q6H PRN Loletha Grayer, MD  Or  . acetaminophen (TYLENOL) suppository 650 mg  650 mg Rectal Q6H PRN Loletha Grayer, MD      . acidophilus (RISAQUAD) capsule 1 capsule  1 capsule Oral BID Loletha Grayer, MD   1 capsule at 07/29/15 0931  . atorvastatin (LIPITOR) tablet 10 mg  10 mg Oral Daily Loletha Grayer, MD   10 mg at 07/29/15 0931  . brimonidine (ALPHAGAN) 0.2 % ophthalmic solution 1 drop  1 drop Right Eye Daily Loletha Grayer, MD   1 drop at 07/29/15 0930   And  . timolol  (TIMOPTIC) 0.5 % ophthalmic solution 1 drop  1 drop Right Eye Daily Loletha Grayer, MD   1 drop at 07/29/15 0930  . cholecalciferol (VITAMIN D) tablet 5,000 Units  5,000 Units Oral Daily Loletha Grayer, MD   5,000 Units at 07/29/15 0931  . feeding supplement (ENSURE ENLIVE) (ENSURE ENLIVE) liquid 237 mL  237 mL Oral BID BM Loletha Grayer, MD   237 mL at 07/29/15 0932  . furosemide (LASIX) tablet 40 mg  40 mg Oral BID Loletha Grayer, MD   40 mg at 07/29/15 0931  . insulin aspart (novoLOG) injection 0-5 Units  0-5 Units Subcutaneous QHS Loletha Grayer, MD   3 Units at 07/28/15 2245  . insulin aspart (novoLOG) injection 0-9 Units  0-9 Units Subcutaneous TID WC Loletha Grayer, MD   5 Units at 07/29/15 [phone]  . insulin detemir (LEVEMIR) injection 6 Units  6 Units Subcutaneous QHS Loletha Grayer, MD   6 Units at 07/28/15 2245  . metoprolol (LOPRESSOR) tablet 50 mg  50 mg Oral BID Loletha Grayer, MD   50 mg at 07/29/15 0931  . pneumococcal 23 valent vaccine (PNU-IMMUNE) injection 0.5 mL  0.5 mL Intramuscular Tomorrow-1000 Loletha Grayer, MD   0.5 mL at 07/29/15 0933  . prednisoLONE acetate (PRED FORTE) 1 % ophthalmic suspension 1 drop  1 drop Both Eyes BID Loletha Grayer, MD   1 drop at 07/29/15 0931  . tamsulosin (FLOMAX) capsule 0.4 mg  0.4 mg Oral Daily Loletha Grayer, MD   0.4 mg at 07/29/15 0931  . vitamin B-12 (CYANOCOBALAMIN) tablet 1,000 mcg  1,000 mcg Oral Daily Loletha Grayer, MD   1,000 mcg at 07/29/15 [id number]     Discharge Medications: Please see discharge summary for a list of discharge medications.  Relevant Imaging Results:  Relevant Lab Results:   Additional Information SSN:  [id number]  Darden Dates, LCSW

## 2015-07-29 NOTE — Care Management Obs Status (Signed)
[place] NOTIFICATION   Patient Details  Name: ETHANIEL STUVER MRN: [id number] Date of Birth: 07-Oct-1935   Medicare Observation Status Notification Given:  Yes    Shelbie Ammons, RN 07/29/2015, 9:56 AM

## 2015-07-29 NOTE — Discharge Instructions (Signed)
Regular diet Activity as tolerated  Discharge to SNF  F/U PCP in 1 week

## 2015-07-29 NOTE — Care Management (Signed)
Admitted to [facility] with the diagnosis of anemia. Discharged from [facility]  07/17/15. A resident of [facility] since 07/17/15. Daughter is [name] [phone]) Hgb 8.6 this  Morning.  Shelbie Ammons RN MSN CCM Care Management [phone]

## 2015-07-30 ENCOUNTER — Ambulatory Visit (HOSPITAL_BASED_OUTPATIENT_CLINIC_OR_DEPARTMENT_OTHER): Payer: Medicare Other | Admitting: Oncology

## 2015-07-30 ENCOUNTER — Telehealth: Payer: Self-pay | Admitting: Oncology

## 2015-07-30 VITALS — BP 119/52 | HR 94 | Temp 97.5°F | Resp 18 | Ht 71.0 in

## 2015-07-30 DIAGNOSIS — E291 Testicular hypofunction: Secondary | ICD-10-CM

## 2015-07-30 DIAGNOSIS — C61 Malignant neoplasm of prostate: Secondary | ICD-10-CM | POA: Diagnosis not present

## 2015-07-30 DIAGNOSIS — C7951 Secondary malignant neoplasm of bone: Secondary | ICD-10-CM

## 2015-07-30 DIAGNOSIS — D649 Anemia, unspecified: Secondary | ICD-10-CM

## 2015-07-30 LAB — TYPE AND SCREEN
ABO/RH(D): B POS
ANTIBODY SCREEN: NEGATIVE
UNIT DIVISION: 0
Unit division: 0

## 2015-07-30 NOTE — Progress Notes (Signed)
Hematology and Oncology Follow Up Visit  [name] [birth year] 81 y.o. 07/30/2015 12:16 PM Jeremy Jennings, MDBrackbill, [name], MD   Principle Diagnosis: 80 year old gentleman diagnosed with prostate cancer in April 2015. He presented with a PSA of 30 and a Gleason score 5+4 = 9. Staging workup showed metastatic bony disease with some lymphadenopathy.   Prior Therapy:  He was treated with androgen deprivation under the care of [name] with a PSA nadir close to 1.8. Most recently his PSA was increased up to 18 and repeat a bone scan showed progression of his disease indicating castration resistant disease. Xtandi 160 mg started in May 2016 was discontinued in September 2016.  Current therapy:  He is currently receiving androgen deprivation as well as monthly Xgeva. These are given at [facility]. Zytiga 1000 mg started in September 2016. Therapy has been on hold since November 2016 because of his recent illness.  Interim History: [name] presents today for a follow-up visit with his daughter. Since the last visit, he was hospitalized in December 2017 for small bowel obstruction which required laparotomy. He had a rather complicated course includes respiratory failure, and PEA arrest. After prolonged hospitalization he was discharged to a skilled nursing facility and early January 2017. He did require another brief hospitalization but most recently had been doing fairly well. He recovered well from his operation and no longer having any residual effect.    He is currently residing is a skilled nursing facility and receiving rehabilitation. He was noted to be anemic and received packed red cell transfusion. He reports that transfusion of helped him and is able to participate in rehabilitation. He is able to walk without the walker and had no falls or syncope. He does not report any back pain shoulder pain or any other pain.   His appetite is improving and quality of  life is improving. He is not back to his baseline at this time. He denies any bloody or tarry stools. Is not having any nosebleeds. He is taking oral iron daily without any complications.    He does not report any constitutional symptoms of  Fevers or chills or sweats.. He did not report any deterioration in his performance status. He does not report any shortness of breath or hemoptysis.  He does not report any chest pain , palpitation or leg edema. He does not report any constipation or diarrhea.  He does not report any early satiety or changes in his bowel habits.. He does not report any frequency urgency or hesitancy. The remaining review of systems unremarkable.  Medications: I have reviewed the patient's current medications.  Current Outpatient Prescriptions  Medication Sig Dispense Refill  . acetaminophen (TYLENOL) 325 MG tablet Take 650 mg by mouth every 6 (six) hours as needed for mild pain or moderate pain.    Marland Kitchen atorvastatin (LIPITOR) 10 MG tablet Take 10 mg by mouth daily.    . Cholecalciferol (VITAMIN D3) 5000 units TABS Take 5,000 Units by mouth daily.    . COMBIGAN 0.2-0.5 % ophthalmic solution Place 1 drop into the right eye daily.     . feeding supplement, ENSURE ENLIVE, (ENSURE ENLIVE) LIQD Take 237 mLs by mouth 2 (two) times daily between meals.    . furosemide (LASIX) 40 MG tablet Take 1 tablet (40 mg total) by mouth 2 (two) times daily.    . insulin aspart (NOVOLOG) 100 UNIT/ML injection Inject 0-9 Units into the skin 3 (three) times daily with meals as needed  for high blood sugar. Pt uses per sliding scale:    70-150:  0 units  151-200:  1 unit  201-250:  2 units  251-300:  3 units  301-350:  5 units  351-400:  7 units  401-500:  9 units and notify MD    . insulin detemir (LEVEMIR) 100 UNIT/ML injection Inject 6 Units into the skin at bedtime.    . megestrol (MEGACE) 400 MG/10ML suspension Take 10 mLs (400 mg total) by mouth 2 (two) times daily. 240 mL 0  . metoprolol  (LOPRESSOR) 50 MG tablet Take 1 tablet (50 mg total) by mouth 2 (two) times daily. 180 tablet 3  . ondansetron (ZOFRAN) 4 MG tablet As directed  0  . prednisoLONE acetate (PRED FORTE) 1 % ophthalmic suspension Place 1 drop into both eyes 2 (two) times daily.     Marland Kitchen saccharomyces boulardii (FLORASTOR) 250 MG capsule Take 1 capsule (250 mg total) by mouth 2 (two) times daily.    . tamsulosin (FLOMAX) 0.4 MG CAPS capsule Take 0.4 mg by mouth daily.     . vitamin B-12 (CYANOCOBALAMIN) 1000 MCG tablet Take 1,000 mcg by mouth daily.     No current facility-administered medications for this visit.     Allergies: No Known Allergies  Past Medical History, Surgical history, Social history, and Family History were reviewed and updated.   Physical Exam: Blood pressure 119/52, pulse 94, temperature 97.5 F (36.4 C), temperature source Oral, resp. rate 18, height 5\' 11"  (1.803 m), SpO2 100 %. ECOG: 1 General appearance: Alert, awake gentleman appeared chronically ill. Head: Normocephalic, without obvious abnormality no oral thrush noted. Neck: no adenopathy Lymph nodes: Cervical, supraclavicular, and axillary nodes normal. Heart:regular rate and rhythm, S1, S2 normal, no murmur, click, rub or gallop Lung:chest clear, no wheezing, rales, normal symmetric air entry. No dullness to percussion. Abdomin: soft, non-tender, without masses or organomegaly no shifting dullness or ascites. EXT:no erythema, induration, or nodules   Lab Results: Lab Results  Component Value Date   WBC 9.0 07/29/2015   HGB 8.6* 07/29/2015   HCT 25.4* 07/29/2015   MCV 88.6 07/29/2015   PLT 200 07/29/2015     Chemistry      Component Value Date/Time   NA 137 07/28/2015 1723   NA 142 05/18/2015 0838   K 3.4* 07/28/2015 1723   K 3.5 05/18/2015 0838   CL 108 07/28/2015 1723   CO2 22 07/28/2015 1723   CO2 18* 05/18/2015 0838   BUN 24* 07/28/2015 1723   BUN 18.3 05/18/2015 0838   CREATININE 0.82 07/28/2015 1723    CREATININE 0.83 07/27/2015 1457   CREATININE 0.8 05/18/2015 0838      Component Value Date/Time   CALCIUM 6.6* 07/28/2015 1723   CALCIUM 6.5* 07/15/2015 0945   CALCIUM 8.4 05/18/2015 0838   ALKPHOS 370* 07/28/2015 1723   ALKPHOS 488* 05/18/2015 0838   AST 40 07/28/2015 1723   AST 18 05/18/2015 0838   ALT 22 07/28/2015 1723   ALT <9 05/18/2015 0838   BILITOT 0.3 07/28/2015 1723   BILITOT 0.33 05/18/2015 0838       Results for UNO, SCHAFFTER (MRN [id number]) as of 07/30/2015 12:16  Ref. Range 03/24/2015 14:47 04/22/2015 15:09 05/18/2015 08:38  PSA Latest Ref Range: <=4.00 ng/mL 45.58 (H) 68.78 (H) 94.88 (H)       Impression and Plan:   80 year old gentleman with the following issues:  1. Castration resistant metastatic prostate cancer with disease to the bone. His  initial diagnosis was in April 2015 with a PSA of 30 and a Gleason score of 5+4 = 9. After initial response with androgen deprivation, he developed castration resistant disease with clear progression by bone scan criteria noted on his recent bone scan on 10/29/2014. He also had a CT scan of the abdomen on 11/09/2014 which did not show any evidence of clear-cut visceral metastasis. He received Xtandi for 5 months and had very little response at this time and that was discontinued in September 2016.  He was started on Zytiga in September 2016 that have been off of it since November 2016 because of his illness and hospitalization. The plan is to resume this medication in the near future once he is fully recovered. I anticipate that to happen in the next 2-3 weeks. I will evaluate him at that time and we'll determine whether he is ready to proceed at that time.  2. Androgen depravation: This is to be continued indefinitely.  He has no delayed complications related to that. He is receiving at [facility].  3. Bone directed therapy: recommended to continue Xgeva on a monthly basis as he is doing. He is receiving that at  [facility].  4. Anemia: Multifactorial in nature and status post recent transfusion with a hemoglobin on 07/29/2015 up to 8.6.  5. Follow-up: Will be in 3 weeks.   Zola Button, MD 1/19/201712:16 PM

## 2015-07-30 NOTE — Telephone Encounter (Signed)
per pof to sch pt appt-gave pt copy of avs °

## 2015-08-03 ENCOUNTER — Encounter: Payer: Medicare Other | Admitting: Physician Assistant

## 2015-08-06 ENCOUNTER — Encounter: Payer: Medicare Other | Admitting: Physician Assistant

## 2015-08-14 ENCOUNTER — Telehealth: Payer: Self-pay | Admitting: Oncology

## 2015-08-14 NOTE — Telephone Encounter (Signed)
Per triage returned call to pt dtr Jeremy Jennings re r/s 08/23/22 appointment due to death in the family. Gave dtr new date/time for lab/FS 2/21 @ 1 pm.

## 2015-08-18 ENCOUNTER — Other Ambulatory Visit: Payer: Medicare Other

## 2015-08-18 ENCOUNTER — Ambulatory Visit: Payer: Medicare Other | Admitting: Oncology

## 2015-08-18 ENCOUNTER — Telehealth: Payer: Self-pay | Admitting: Cardiology

## 2015-08-18 NOTE — Telephone Encounter (Signed)
New message      Calling to get verbal orders to continue medical management. They need parameters on when to call the doctor because pt has tachycardia and his HR is always high.  He is scheduled to see Dr Mare Ferrari this week.  Please call

## 2015-08-18 NOTE — Telephone Encounter (Signed)
[name] from [facility] called to have parameters set by Dr. Mare Ferrari for HR, BP and BS for notification purposes. The patient will be seen by a nurse at home twice weekly. His baseline HR is around 100, but they are supposed to notify the doctor for HR over 90. She also asks for instructions to tell patient's daughter what BS to hold Levemir. She st the patient is not eating much. The patient is seeing oncology and is trying to decide whether to get treatment or palliative care instead. Patient to see Dr. Mare Ferrari this Thursday, but [place] would like parameters ASAP.  To Dr. Mare Ferrari.

## 2015-08-18 NOTE — Telephone Encounter (Signed)
Hold Levimir if BS is less than 120 mg  Call if pulse [id number] or systolic BP 0000000

## 2015-08-19 NOTE — Telephone Encounter (Signed)
Jeremy Jennings is returning your call

## 2015-08-19 NOTE — Telephone Encounter (Signed)
Left message to call back  

## 2015-08-19 NOTE — Telephone Encounter (Signed)
Reviewed parameters set by Dr. Mare Ferrari. Jeremy Jennings understands to call if HR [id number], systolic BP 0000000, and to hold Levemir if BS<120. Jeremy Jennings was grateful for callback.

## 2015-08-20 ENCOUNTER — Telehealth: Payer: Self-pay | Admitting: Cardiology

## 2015-08-20 ENCOUNTER — Ambulatory Visit (INDEPENDENT_AMBULATORY_CARE_PROVIDER_SITE_OTHER): Payer: Medicare Other | Admitting: Cardiology

## 2015-08-20 ENCOUNTER — Encounter: Payer: Self-pay | Admitting: Cardiology

## 2015-08-20 VITALS — BP 120/48 | HR 96 | Ht 71.0 in | Wt 165.0 lb

## 2015-08-20 DIAGNOSIS — D649 Anemia, unspecified: Secondary | ICD-10-CM | POA: Diagnosis not present

## 2015-08-20 DIAGNOSIS — R000 Tachycardia, unspecified: Secondary | ICD-10-CM

## 2015-08-20 DIAGNOSIS — I959 Hypotension, unspecified: Secondary | ICD-10-CM

## 2015-08-20 DIAGNOSIS — I5032 Chronic diastolic (congestive) heart failure: Secondary | ICD-10-CM | POA: Diagnosis not present

## 2015-08-20 LAB — BASIC METABOLIC PANEL
BUN: 32 mg/dL — AB (ref 7–25)
CHLORIDE: 107 mmol/L (ref 98–110)
CO2: 16 mmol/L — ABNORMAL LOW (ref 20–31)
Calcium: 7.3 mg/dL — ABNORMAL LOW (ref 8.6–10.3)
Creat: 1.08 mg/dL (ref 0.70–1.18)
GLUCOSE: 186 mg/dL — AB (ref 65–99)
POTASSIUM: 3.4 mmol/L — AB (ref 3.5–5.3)
Sodium: 136 mmol/L (ref 135–146)

## 2015-08-20 LAB — CBC WITH DIFFERENTIAL/PLATELET
Basophils Absolute: 0.1 10*3/uL (ref 0.0–0.1)
Basophils Relative: 1 % (ref 0–1)
EOS PCT: 1 % (ref 0–5)
Eosinophils Absolute: 0.1 10*3/uL (ref 0.0–0.7)
HEMATOCRIT: 19.3 % — AB (ref 39.0–52.0)
Hemoglobin: 6.7 g/dL — CL (ref 13.0–17.0)
LYMPHS ABS: 1.1 10*3/uL (ref 0.7–4.0)
LYMPHS PCT: 17 % (ref 12–46)
MCH: 30.7 pg (ref 26.0–34.0)
MCHC: 34.7 g/dL (ref 30.0–36.0)
MCV: 88.5 fL (ref 78.0–100.0)
MONO ABS: 0.5 10*3/uL (ref 0.1–1.0)
MPV: 9.7 fL (ref 8.6–12.4)
Monocytes Relative: 8 % (ref 3–12)
Neutro Abs: 4.7 10*3/uL (ref 1.7–7.7)
Neutrophils Relative %: 73 % (ref 43–77)
Platelets: 135 10*3/uL — ABNORMAL LOW (ref 150–400)
RBC: 2.18 MIL/uL — ABNORMAL LOW (ref 4.22–5.81)
RDW: 18.1 % — AB (ref 11.5–15.5)
WBC: 6.5 10*3/uL (ref 4.0–10.5)

## 2015-08-20 NOTE — Telephone Encounter (Signed)
Error. Already documented

## 2015-08-20 NOTE — Patient Instructions (Signed)
Medication Instructions:  START FOLIC ACID 1 MG DAILY   DECREASE YOUR LASIX (FUROSEMIDE) TO 40 MG ONCE A DAY   Labwork: BMET/CBC  Testing/Procedures: NONE   Follow-Up: KEEP YOUR April APPOINTMENT   If you need a refill on your cardiac medications before your next appointment, please call your pharmacy.

## 2015-08-20 NOTE — Telephone Encounter (Signed)
Called with a stat Hbg of 6.7.  The patient has had anemia of chronic disease requiring transfusion with a very complicated recent history.  Labs were drawn in routine follow up.  I reviewed the chart.  There was no mention of any acute bleeding.  This Hbg is lower than the most recent but consistent with readings prior to that.  I tried to call the patient but was unable to reach him on either the cell or home number.  We will try again in the AM.  He will likely need another transfusion.

## 2015-08-20 NOTE — Progress Notes (Signed)
Cardiology Office Note   Date:  08/20/2015   ID:  Jeremy Jennings, DOB [birth year], MRN [id number]  PCP:  [name], MD  Cardiologist: Darlin Coco MD  No chief complaint on file.    History of Present Illness: Jeremy Jennings is a 80 y.o. male who presents for Follow-up office visit.  Jeremy Jennings is a 80 y.o. male with history of stage 4 prostate CA, HTN, DM, recent GIB, chronic-appearing sinus tach (with PACs, PVCs), chronic diastolic CHF, hypertensive heart disease, protein calorie malnutrition, anemia, hypoalbuminemia contributing to significant edema, and recent prolonged admissions (including PEA arrest 06/2015) who presents for follow-up.He had admissions to the hospital on 05/27/2015, 06/11/2015, and 07/04/2015.  He was recently released a week ago from skilled nursing facility and he is now back home.  His daughter [name] is helping to look after him.Physical therapy and home health or coming into the home.  He had a complicated end of the year in 2016. In 06/2015 he was admitted 12/1-12/23/16 with SBO and underwent ex-lap/LOA [id number], initially complicated by post-op ileus, fever, enterococcal UTI, sinus tachycardia. LE duplex/CTA 12/9 neg for DVT/PE. 2D Echo 06/19/15: mod LVH, mid LV cavity systolic obliteration with increased mid-cavitary gradient, EF 65%, PASP 34. He developed ventilator dependent respiratory failure on 06/23/15 a/w bradycardic to PEA arrest - time to ROSC 6 minutes, received 2 amps of epinephrine - felt to be primary respiratory event. Has also had acute encephalopathy with periodic confusion. Cardiology followed him for a/c diastolic CHF and chronic sinus tach. Cardiology followed for acute on chronic diastolic CHF and sinus tach.   He was readmitted 07/04/15-07/17/15 with fever, diarrhea, and septic shock due to C diff colitis. During admission he had acute hypoxic respiratory failure due to acute on chronic diastolic CHF, electrolyte abnormalities  (hypokalemia, hypocalcemia, hypomagnesemia and hypophosphatemia), and protein calorie malnutrition. He had an elevated troponin felt due to demand ischemia (only 0.16) - no invasive workup was recommended. EKG was without acute changes and he denied any anginal symptoms. Discharge weight on 1/5 was 189lb (with goal weight <200lb). He was seen back in the ER 07/24/15 for anemia and diarrhea. Hgb was 7.0 (7-8 range during recent admission), troponin negative, Cr 0.95, albumin 1.7. (Note during recent hospital stays his low albumin was felt to be contributing to LEE.)  The patient has lost a lot of weight.  His weight today is 165.  In January he weighed 216 pounds although some of this was fluid. He has had a poor appetite.  He has not been taking the Megace that was prescribed for him.  He does not have any taste for food.  He continues to run an occasional low-grade temperature of 99-100.  He is still having loose bowels but no frank diarrhea.  He denies any chest pain.  He does have exertional dyspnea and fatigue. He had an echocardiogram on 06/29/15 which showed an ejection fraction of 65-70%.  Past Medical History  Diagnosis Date  . Essential hypertension   . Type 2 diabetes mellitus ([place])   . Hyperlipidemia   . Prostate cancer ([place])     Stage 4  . History of GI bleed   . Sinus tachycardia ([facility])     Chronic  . PAC (premature atrial contraction)     Frequent  . PVC's (premature ventricular contractions)   . Chronic diastolic CHF (congestive heart failure) ([place])   . Hypertensive heart disease   . Protein calorie malnutrition ([place])   .  Anemia   . SBO (small bowel obstruction) ([place])     a. 06/2015: prolonged admissions for SBO s/p exlap/LOA c/b post-op ileus, fever, enterococcal UTI, a/c dCHF, acute encephalopathy, anemia, and readmission for septic shock due to C diff colitis.  Marland Kitchen PEA (Pulseless electrical activity) ([place])     a. 06/2015: in setting of acute respiratory failure, felt to be  primary resp event.  . C. difficile colitis     a. 06/2015: a/w septic shock.  . Hypoalbuminemia     Past Surgical History  Procedure Laterality Date  . Tonsillectomy    . Hemorroidectomy  1969  . Esophagogastroduodenoscopy N/A 05/28/2015    Procedure: ESOPHAGOGASTRODUODENOSCOPY (EGD);  Surgeon: [name], MD;  Location: [name];  Service: Endoscopy;  Laterality: N/A;  . Inguinal hernia repair Bilateral AFE 16-17  . Hernia repair  1981    [facility] [place]  . Laparotomy N/A 06/12/2015    Procedure: EXPLORATORY LAPAROTOMY;  Surgeon: [name], MD;  Location: [facility];  Service: General;  Laterality: N/A;  . Bowel resection N/A 06/12/2015    Procedure: SMALL BOWEL RESECTION;  Surgeon: [name], MD;  Location: [facility];  Service: General;  Laterality: N/A;     Current Outpatient Prescriptions  Medication Sig Dispense Refill  . acetaminophen (TYLENOL) 325 MG tablet Take 650 mg by mouth every 6 (six) hours as needed for mild pain or moderate pain.    Marland Kitchen atorvastatin (LIPITOR) 10 MG tablet Take 10 mg by mouth daily.    . Cholecalciferol (VITAMIN D3) 5000 units TABS Take 5,000 Units by mouth daily.    . COMBIGAN 0.2-0.5 % ophthalmic solution Place 1 drop into the right eye daily.     . feeding supplement, ENSURE ENLIVE, (ENSURE ENLIVE) LIQD Take 237 mLs by mouth 2 (two) times daily between meals.    . folic acid (FOLVITE) 1 MG tablet Take 1 mg by mouth daily.    . furosemide (LASIX) 40 MG tablet Take 40 mg by mouth.    . insulin aspart (NOVOLOG) 100 UNIT/ML injection Inject 0-9 Units into the skin 3 (three) times daily with meals as needed for high blood sugar. Pt uses per sliding scale:    70-150:  0 units  151-200:  1 unit  201-250:  2 units  251-300:  3 units  301-350:  5 units  351-400:  7 units  401-500:  9 units and notify MD    . insulin detemir (LEVEMIR) 100 UNIT/ML injection Inject 6 Units into the skin at bedtime.    . megestrol (MEGACE) 400 MG/10ML suspension  Take 10 mLs (400 mg total) by mouth 2 (two) times daily. 240 mL 0  . metoprolol (LOPRESSOR) 50 MG tablet Take 1 tablet (50 mg total) by mouth 2 (two) times daily. 180 tablet 3  . ondansetron (ZOFRAN) 4 MG tablet As directed  0  . prednisoLONE acetate (PRED FORTE) 1 % ophthalmic suspension Place 1 drop into both eyes 2 (two) times daily.     Marland Kitchen saccharomyces boulardii (FLORASTOR) 250 MG capsule Take 1 capsule (250 mg total) by mouth 2 (two) times daily.    . tamsulosin (FLOMAX) 0.4 MG CAPS capsule Take 0.4 mg by mouth daily.     . vitamin B-12 (CYANOCOBALAMIN) 1000 MCG tablet Take 1,000 mcg by mouth daily.     No current facility-administered medications for this visit.    Allergies:   Review of patient's allergies indicates no known allergies.    Social History:  The patient  reports that he quit smoking about 36 years ago. His smoking use included Cigarettes. He has a 5 pack-year smoking history. He has never used smokeless tobacco. He reports that he does not drink alcohol or use illicit drugs.   Family History:  The patient's family history includes Diabetes in his mother; Hypertension in his father; Kidney failure in his brother and sister. There is no history of Cancer, Heart attack, or Stroke.    ROS:  Please see the history of present illness.   Otherwise, review of systems are positive for none.   All other systems are reviewed and negative.    PHYSICAL EXAM: VS:  BP 120/48 mmHg  Pulse 96  Ht 5\' 11"  (1.803 m)  Wt 165 lb (74.844 kg)  BMI 23.02 kg/m2 , BMI Body mass index is 23.02 kg/(m^2). GEN: Well nourished, well developed, in no acute distress HEENT: normal Neck: no JVD, carotid bruits, or masses Cardiac: RRR; no murmurs, rubs, or gallops,no edema  Respiratory:  clear to auscultation bilaterally, normal work of breathing GI: soft, nontender, nondistended, + BS MS: no deformity or atrophy Skin: warm and dry, no rash Neuro:  Strength and sensation are intact Psych:  euthymic mood, full affect.Daughter feels that he appears mildly depressed.   EKG:  EKG is not ordered today.    Recent Labs: 05/28/2015: TSH 2.837 07/05/2015: B Natriuretic Peptide 348.0* 07/27/2015: Magnesium 1.8 07/28/2015: ALT 22; BUN 24*; Creatinine, Ser 0.82; Potassium 3.4*; Sodium 137 07/29/2015: Hemoglobin 8.6*; Platelets 200    Lipid Panel    Component Value Date/Time   CHOL 167 12/10/2014 1013   TRIG 161* 06/29/2015 0400   HDL 37.50* 12/10/2014 1013   CHOLHDL 4 12/10/2014 1013   VLDL 21.8 12/10/2014 1013   LDLCALC 108* 12/10/2014 1013      Wt Readings from Last 3 Encounters:  08/20/15 165 lb (74.844 kg)  07/28/15 216 lb (97.977 kg)  07/27/15 216 lb (97.977 kg)        ASSESSMENT AND PLAN:  1. 1. Chronic diastolic CHF - He is currently on furosemide 40 mg twice a day.  He has lost considerable weight.  His lungs are clear and there is no peripheral edema or evidence of fluid excess.  We will reduce his furosemide to just 40 mg once a day. 2. Hypoalbuminemia - likely contributing to the edema he previously experienced. Needs attention to protein at mealtime. He says he is doing Ensure.He has not been taking the Megace but has it at home.  I encouraged him to try it 3. Elevated troponin - no recent chest pain. Felt secondary to demand ischemia in the face of numerous comorbidities. 4. Anemia -We will get CBC today. 5. Sinus tachycardia - Heart rate improves since last office visit..    Current medicines are reviewed at length with the patient today.  The patient does not have concerns regarding medicines.  The following changes have been made:  Decrease Lasix to 40 mg daily.  If his edema returns, he will go back onto the higher dose of twice a day  Labs/ tests ordered today include:   Orders Placed This Encounter  Procedures  . Basic metabolic panel  . CBC with Differential/Platelet     Disposition:   The patient has been taking folic acid 1 mg daily and  we added that to his medication list. Because of his weakness he needs a lightweight wheelchair to get around and we requested that. We are checking a CBC and a basal  metabolic panel today. He has an appointment to get established with [facility] primary care in early March.  He will Follow-up with cardiology with Dr.  Johnsie Cancel in 3 months.  Berna Spare MD 08/20/2015 5:43 PM    [facility] [place], [place], [place]  [address] Phone: [phone]; Fax: [phone]

## 2015-08-21 ENCOUNTER — Inpatient Hospital Stay (HOSPITAL_COMMUNITY)
Admission: EM | Admit: 2015-08-21 | Discharge: 2015-08-23 | DRG: 812 | Disposition: A | Discharge status: Home Health | Payer: Medicare Other | Attending: Internal Medicine | Admitting: Internal Medicine

## 2015-08-21 ENCOUNTER — Encounter (HOSPITAL_COMMUNITY): Payer: Self-pay | Admitting: *Deleted

## 2015-08-21 ENCOUNTER — Encounter: Payer: Self-pay | Admitting: *Deleted

## 2015-08-21 DIAGNOSIS — I110 Hypertensive heart disease with heart failure: Secondary | ICD-10-CM | POA: Diagnosis present

## 2015-08-21 DIAGNOSIS — D649 Anemia, unspecified: Secondary | ICD-10-CM | POA: Diagnosis present

## 2015-08-21 DIAGNOSIS — E119 Type 2 diabetes mellitus without complications: Secondary | ICD-10-CM | POA: Diagnosis present

## 2015-08-21 DIAGNOSIS — C61 Malignant neoplasm of prostate: Secondary | ICD-10-CM | POA: Diagnosis present

## 2015-08-21 DIAGNOSIS — I10 Essential (primary) hypertension: Secondary | ICD-10-CM | POA: Diagnosis not present

## 2015-08-21 DIAGNOSIS — E118 Type 2 diabetes mellitus with unspecified complications: Secondary | ICD-10-CM

## 2015-08-21 DIAGNOSIS — R69 Illness, unspecified: Secondary | ICD-10-CM

## 2015-08-21 DIAGNOSIS — Z794 Long term (current) use of insulin: Secondary | ICD-10-CM

## 2015-08-21 DIAGNOSIS — I5032 Chronic diastolic (congestive) heart failure: Secondary | ICD-10-CM | POA: Diagnosis present

## 2015-08-21 LAB — GLUCOSE, CAPILLARY
GLUCOSE-CAPILLARY: 169 mg/dL — AB (ref 65–99)
Glucose-Capillary: 217 mg/dL — ABNORMAL HIGH (ref 65–99)

## 2015-08-21 LAB — CBC WITH DIFFERENTIAL/PLATELET
BASOS PCT: 0 %
Basophils Absolute: 0 10*3/uL (ref 0.0–0.1)
EOS ABS: 0.1 10*3/uL (ref 0.0–0.7)
Eosinophils Relative: 1 %
HEMATOCRIT: 17.7 % — AB (ref 39.0–52.0)
HEMOGLOBIN: 5.9 g/dL — AB (ref 13.0–17.0)
LYMPHS ABS: 0.9 10*3/uL (ref 0.7–4.0)
LYMPHS PCT: 14 %
MCH: 30.3 pg (ref 26.0–34.0)
MCHC: 33.3 g/dL (ref 30.0–36.0)
MCV: 90.8 fL (ref 78.0–100.0)
MONO ABS: 0.3 10*3/uL (ref 0.1–1.0)
Monocytes Relative: 5 %
NEUTROS ABS: 5.3 10*3/uL (ref 1.7–7.7)
Neutrophils Relative %: 80 %
Platelets: 123 10*3/uL — ABNORMAL LOW (ref 150–400)
RBC: 1.95 MIL/uL — ABNORMAL LOW (ref 4.22–5.81)
RDW: 18.9 % — AB (ref 11.5–15.5)
WBC: 6.6 10*3/uL (ref 4.0–10.5)

## 2015-08-21 LAB — BASIC METABOLIC PANEL
Anion gap: 11 (ref 5–15)
BUN: 33 mg/dL — AB (ref 6–20)
CALCIUM: 7.5 mg/dL — AB (ref 8.9–10.3)
CO2: 20 mmol/L — ABNORMAL LOW (ref 22–32)
CREATININE: 1.09 mg/dL (ref 0.61–1.24)
Chloride: 110 mmol/L (ref 101–111)
GFR calc Af Amer: >60 mL/min (ref >60)
GFR calc non Af Amer: >60 mL/min (ref >60)
GLUCOSE: 225 mg/dL — AB (ref 65–99)
Potassium: 3.5 mmol/L (ref 3.5–5.1)
Sodium: 141 mmol/L (ref 135–145)

## 2015-08-21 LAB — PREPARE RBC (CROSSMATCH)

## 2015-08-21 LAB — PROTIME-INR
INR: 1.33 (ref 0.00–1.49)
PROTHROMBIN TIME: 16.1 s — AB (ref 11.6–15.2)

## 2015-08-21 LAB — POC OCCULT BLOOD, ED: Fecal Occult Bld: NEGATIVE

## 2015-08-21 MED ORDER — BRIMONIDINE TARTRATE-TIMOLOL 0.2-0.5 % OP SOLN: 1.0000 [drp] | Freq: Every day | OPHTHALMIC | Status: DC

## 2015-08-21 MED ORDER — TAMSULOSIN HCL 0.4 MG PO CAPS
0.4000 mg | ORAL_CAPSULE | Freq: Every day | ORAL | Status: DC
  Administered 2015-08-21 – 2015-08-22 (×2): 0.4 mg via ORAL
  Filled 2015-08-21 (×3): qty 1

## 2015-08-21 MED ORDER — FUROSEMIDE 10 MG/ML IJ SOLN
20.0000 mg | Freq: Once | INTRAMUSCULAR | Status: AC
  Administered 2015-08-21: 20 mg via INTRAVENOUS
  Filled 2015-08-21: qty 2

## 2015-08-21 MED ORDER — TIMOLOL MALEATE 0.5 % OP SOLN
1.0000 [drp] | Freq: Every day | OPHTHALMIC | Status: DC
  Administered 2015-08-21 – 2015-08-22 (×2): 1 [drp] via OPHTHALMIC
  Filled 2015-08-21: qty 5

## 2015-08-21 MED ORDER — METOPROLOL TARTRATE 50 MG PO TABS
50.0000 mg | ORAL_TABLET | Freq: Two times a day (BID) | ORAL | Status: DC
  Administered 2015-08-21 – 2015-08-22 (×3): 50 mg via ORAL
  Filled 2015-08-21 (×3): qty 1

## 2015-08-21 MED ORDER — VITAMIN B-12 1000 MCG PO TABS
1000.0000 ug | ORAL_TABLET | Freq: Every day | ORAL | Status: DC
  Administered 2015-08-21 – 2015-08-22 (×2): 1000 ug via ORAL
  Filled 2015-08-21 (×3): qty 1

## 2015-08-21 MED ORDER — FOLIC ACID 1 MG PO TABS
1.0000 mg | ORAL_TABLET | Freq: Every day | ORAL | Status: DC
  Administered 2015-08-21 – 2015-08-22 (×2): 1 mg via ORAL
  Filled 2015-08-21 (×3): qty 1

## 2015-08-21 MED ORDER — FUROSEMIDE 10 MG/ML IJ SOLN
20.0000 mg | Freq: Once | INTRAMUSCULAR | Status: AC
  Administered 2015-08-21: 20 mg via INTRAVENOUS
  Filled 2015-08-21: qty 4

## 2015-08-21 MED ORDER — ACETAMINOPHEN 325 MG PO TABS
650.0000 mg | ORAL_TABLET | Freq: Four times a day (QID) | ORAL | Status: DC | PRN
  Administered 2015-08-21 – 2015-08-23 (×6): 650 mg via ORAL
  Filled 2015-08-21 (×6): qty 2

## 2015-08-21 MED ORDER — INSULIN DETEMIR 100 UNIT/ML ~~LOC~~ SOLN
6.0000 [IU] | Freq: Every day | SUBCUTANEOUS | Status: DC
  Administered 2015-08-21 – 2015-08-22 (×2): 6 [IU] via SUBCUTANEOUS
  Filled 2015-08-21 (×2): qty 0.06

## 2015-08-21 MED ORDER — ENSURE ENLIVE PO LIQD
237.0000 mL | Freq: Two times a day (BID) | ORAL | Status: DC
  Administered 2015-08-22: 237 mL via ORAL

## 2015-08-21 MED ORDER — SODIUM CHLORIDE 0.9% FLUSH
3.0000 mL | Freq: Two times a day (BID) | INTRAVENOUS | Status: DC
  Administered 2015-08-21 – 2015-08-22 (×3): 3 mL via INTRAVENOUS

## 2015-08-21 MED ORDER — PREDNISOLONE ACETATE 1 % OP SUSP
1.0000 [drp] | Freq: Two times a day (BID) | OPHTHALMIC | Status: DC
  Administered 2015-08-21 – 2015-08-22 (×3): 1 [drp] via OPHTHALMIC
  Filled 2015-08-21: qty 1

## 2015-08-21 MED ORDER — ATORVASTATIN CALCIUM 10 MG PO TABS
10.0000 mg | ORAL_TABLET | Freq: Every day | ORAL | Status: DC
  Administered 2015-08-21 – 2015-08-22 (×2): 10 mg via ORAL
  Filled 2015-08-21 (×3): qty 1

## 2015-08-21 MED ORDER — FUROSEMIDE 40 MG PO TABS
40.0000 mg | ORAL_TABLET | Freq: Every day | ORAL | Status: DC
  Administered 2015-08-21 – 2015-08-22 (×2): 40 mg via ORAL
  Filled 2015-08-21 (×2): qty 1

## 2015-08-21 MED ORDER — SACCHAROMYCES BOULARDII 250 MG PO CAPS
250.0000 mg | ORAL_CAPSULE | Freq: Two times a day (BID) | ORAL | Status: DC
  Administered 2015-08-21 – 2015-08-22 (×3): 250 mg via ORAL
  Filled 2015-08-21 (×5): qty 1

## 2015-08-21 MED ORDER — ONDANSETRON HCL 4 MG/2ML IJ SOLN: 4.0000 mg | Freq: Four times a day (QID) | INTRAMUSCULAR | Status: DC | PRN

## 2015-08-21 MED ORDER — INSULIN ASPART 100 UNIT/ML ~~LOC~~ SOLN
0.0000 [IU] | Freq: Three times a day (TID) | SUBCUTANEOUS | Status: DC
  Administered 2015-08-21 – 2015-08-22 (×3): 3 [IU] via SUBCUTANEOUS

## 2015-08-21 MED ORDER — ACETAMINOPHEN 650 MG RE SUPP: 650.0000 mg | Freq: Four times a day (QID) | RECTAL | Status: DC | PRN

## 2015-08-21 MED ORDER — ONDANSETRON HCL 4 MG PO TABS: 4.0000 mg | ORAL_TABLET | Freq: Four times a day (QID) | ORAL | Status: DC | PRN

## 2015-08-21 MED ORDER — MEGESTROL ACETATE 400 MG/10ML PO SUSP
400.0000 mg | Freq: Two times a day (BID) | ORAL | Status: DC
  Administered 2015-08-21 – 2015-08-22 (×3): 400 mg via ORAL
  Filled 2015-08-21 (×5): qty 10

## 2015-08-21 MED ORDER — BRIMONIDINE TARTRATE 0.2 % OP SOLN
1.0000 [drp] | Freq: Every day | OPHTHALMIC | Status: DC
  Administered 2015-08-21 – 2015-08-22 (×2): 1 [drp] via OPHTHALMIC
  Filled 2015-08-21: qty 5

## 2015-08-21 MED ORDER — SODIUM CHLORIDE 0.9 % IV SOLN: Freq: Once | INTRAVENOUS | Status: DC

## 2015-08-21 NOTE — ED Notes (Signed)
Nurse at bedside collecting labs 

## 2015-08-21 NOTE — ED Notes (Signed)
Pt's daughter [name] has to leave to go to work but would like to be called with any updates or changes. [phone]

## 2015-08-21 NOTE — ED Notes (Signed)
Pt brought in by daughter after she received a call from PCP about abnormal labs, specifically Hgb 6.7 and worsening renal function. Pt denies pain. Chronic anemia with Hx previous transfusions. Reports slight shob, and generalized weakness.

## 2015-08-21 NOTE — ED Notes (Signed)
ABO/Rh WILL HAVE TO BE PULLED OFF OF PORT

## 2015-08-21 NOTE — ED Notes (Addendum)
Upon assessment pt denies symptoms associated with low hemoglobin/anemia; reports came here because primary doctor told him to do so. Pt continues to report chronic left groin pain related to hx of arthritis.

## 2015-08-21 NOTE — ED Provider Notes (Signed)
CSN: [id number]     Arrival date & time 08/21/15  [id number] History   First MD Initiated Contact with Patient 08/21/15 0932     Chief Complaint  Patient presents with  . Anemia  . Abnormal Lab     (Consider location/radiation/quality/duration/timing/severity/associated sxs/prior Treatment) HPI Patient was seen by his cardiologist in follow-up yesterday. The ordered outpatient lab testing and identified that the patient was profoundly anemic. He was advised to come to the emergency department. He reports he has some chronic problems with weakness and shortness of breath but has not noted that he has had any specific worsening. Patient had an exploratory laparotomy with small bowel resection toward the end of December, this had a complicated postoperative course with readmission for sepsis and anemia. At this time, the patient has no complaint of abdominal pain. He has not noted any blood in his stool. He has not had vomiting. He denies chest pain.  Past Medical History  Diagnosis Date  . Essential hypertension   . Type 2 diabetes mellitus ([place])   . Hyperlipidemia   . Prostate cancer ([place])     Stage 4  . History of GI bleed   . Sinus tachycardia ([facility])     Chronic  . PAC (premature atrial contraction)     Frequent  . PVC's (premature ventricular contractions)   . Chronic diastolic CHF (congestive heart failure) ([place])   . Hypertensive heart disease   . Protein calorie malnutrition ([place])   . Anemia   . SBO (small bowel obstruction) ([place])     a. 06/2015: prolonged admissions for SBO s/p exlap/LOA c/b post-op ileus, fever, enterococcal UTI, a/c dCHF, acute encephalopathy, anemia, and readmission for septic shock due to C diff colitis.  Marland Kitchen PEA (Pulseless electrical activity) ([place])     a. 06/2015: in setting of acute respiratory failure, felt to be primary resp event.  . C. difficile colitis     a. 06/2015: a/w septic shock.  . Hypoalbuminemia    Past Surgical History  Procedure Laterality  Date  . Tonsillectomy    . Hemorroidectomy  1969  . Esophagogastroduodenoscopy N/A 05/28/2015    Procedure: ESOPHAGOGASTRODUODENOSCOPY (EGD);  Surgeon: [name], MD;  Location: [name];  Service: Endoscopy;  Laterality: N/A;  . Inguinal hernia repair Bilateral AFE 16-17  . Hernia repair  1981    [facility] [place]  . Laparotomy N/A 06/12/2015    Procedure: EXPLORATORY LAPAROTOMY;  Surgeon: [name], MD;  Location: [facility];  Service: General;  Laterality: N/A;  . Bowel resection N/A 06/12/2015    Procedure: SMALL BOWEL RESECTION;  Surgeon: [name], MD;  Location: [facility];  Service: General;  Laterality: N/A;   Family History  Problem Relation Age of Onset  . Cancer Neg Hx   . Heart attack Neg Hx     unknown  . Stroke Neg Hx     unknown  . Kidney failure Brother   . Kidney failure Sister   . Diabetes Mother   . Hypertension Father    Social History  Substance Use Topics  . Smoking status: Former Smoker -- 0.25 packs/day for 20 years    Types: Cigarettes    Quit date: 07/12/1979  . Smokeless tobacco: Never Used  . Alcohol Use: No    Review of Systems  10 Systems reviewed and are negative for acute change except as noted in the HPI.   Allergies  Review of patient's allergies indicates no known allergies.  Home Medications   Prior  to Admission medications   Medication Sig Start Date End Date Taking? Authorizing Provider  acetaminophen (TYLENOL) 325 MG tablet Take 650 mg by mouth every 6 (six) hours as needed for mild pain or moderate pain.   Yes Historical Provider, MD  atorvastatin (LIPITOR) 10 MG tablet Take 10 mg by mouth daily.   Yes Historical Provider, MD  Cholecalciferol (VITAMIN D3) 5000 units TABS Take 5,000 Units by mouth daily.   Yes Historical Provider, MD  COMBIGAN 0.2-0.5 % ophthalmic solution Place 1 drop into the right eye daily.    Yes Historical Provider, MD  feeding supplement, ENSURE ENLIVE, (ENSURE ENLIVE) LIQD Take 237 mLs by mouth 2  (two) times daily between meals. 07/17/15  Yes Modena Jansky, MD  folic acid (FOLVITE) 1 MG tablet Take 1 mg by mouth daily.   Yes Historical Provider, MD  furosemide (LASIX) 40 MG tablet Take 40 mg by mouth daily.    Yes Historical Provider, MD  insulin aspart (NOVOLOG) 100 UNIT/ML injection Inject 0-9 Units into the skin 3 (three) times daily with meals as needed for high blood sugar. Pt uses per sliding scale:    70-150:  0 units  151-200:  1 unit  201-250:  2 units  251-300:  3 units  301-350:  5 units  351-400:  7 units  401-500:  9 units and notify MD   Yes Historical Provider, MD  insulin detemir (LEVEMIR) 100 UNIT/ML injection Inject 6 Units into the skin at bedtime.   Yes Historical Provider, MD  megestrol (MEGACE) 400 MG/10ML suspension Take 10 mLs (400 mg total) by mouth 2 (two) times daily. 05/18/15  Yes Wyatt Portela, MD  metoprolol (LOPRESSOR) 50 MG tablet Take 1 tablet (50 mg total) by mouth 2 (two) times daily. 07/27/15  Yes Dayna N Dunn, PA-C  ondansetron (ZOFRAN) 4 MG tablet Take 4 mg by mouth every 8 (eight) hours as needed for nausea or vomiting. As directed 07/03/15  Yes Historical Provider, MD  prednisoLONE acetate (PRED FORTE) 1 % ophthalmic suspension Place 1 drop into both eyes 2 (two) times daily.  04/25/13  Yes Historical Provider, MD  saccharomyces boulardii (FLORASTOR) 250 MG capsule Take 1 capsule (250 mg total) by mouth 2 (two) times daily. 07/17/15  Yes Modena Jansky, MD  tamsulosin (FLOMAX) 0.4 MG CAPS capsule Take 0.4 mg by mouth daily.    Yes Historical Provider, MD  vitamin B-12 (CYANOCOBALAMIN) 1000 MCG tablet Take 1,000 mcg by mouth daily.   Yes Historical Provider, MD   BP 131/55 mmHg  Pulse 97  Temp(Src) 97.3 F (36.3 C) (Oral)  Resp 20  SpO2 100% Physical Exam  Constitutional: He is oriented to person, place, and time.  The patient is very thin and slightly deconditioned. He is alert and nontoxic. No respiratory distress.  HENT:  Head:  Normocephalic and atraumatic.  Mouth/Throat: Oropharynx is clear and moist.  Eyes: EOM are normal. Pupils are equal, round, and reactive to light.  Neck: Neck supple.  Cardiovascular: Normal rate, regular rhythm and intact distal pulses.   Pulmonary/Chest: Effort normal and breath sounds normal.  Abdominal: Soft. Bowel sounds are normal. He exhibits no distension. There is no tenderness.  Genitourinary:  Soft brown stool in the rectal vault.  Musculoskeletal: Normal range of motion. He exhibits no edema.  Neurological: He is alert and oriented to person, place, and time. He has normal strength. Coordination normal. GCS eye subscore is 4. GCS verbal subscore is 5. GCS motor subscore  is 6.  Skin: Skin is warm, dry and intact.  Psychiatric: He has a normal mood and affect.    ED Course  Procedures (including critical care time) Labs Review Labs Reviewed  PROTIME-INR - Abnormal; Notable for the following:    Prothrombin Time 16.1 (*)    All other components within normal limits  BASIC METABOLIC PANEL - Abnormal; Notable for the following:    CO2 20 (*)    Glucose, Bld 225 (*)    BUN 33 (*)    Calcium 7.5 (*)    All other components within normal limits  CBC WITH DIFFERENTIAL/PLATELET - Abnormal; Notable for the following:    RBC 1.95 (*)    Hemoglobin 5.9 (*)    HCT 17.7 (*)    RDW 18.9 (*)    Platelets 123 (*)    All other components within normal limits  CBC WITH DIFFERENTIAL/PLATELET  POC OCCULT BLOOD, ED  TYPE AND SCREEN  ABO/RH  ANTIBODY SCREEN  PREPARE RBC (CROSSMATCH)    Imaging Review No results found. I have personally reviewed and evaluated these images and lab results as part of my medical decision-making.   EKG Interpretation   Date/Time:  Friday August 21 2015 10:25:52 EST Ventricular Rate:  94 PR Interval:  145 QRS Duration: 86 QT Interval:  398 QTC Calculation: 498 R Axis:   39 Text Interpretation:  Sinus rhythm Atrial premature complex Borderline   prolonged QT interval Baseline wander in lead(s) V6 Confirmed by Johnney Killian,  MD, Jeannie Done [phone]) on 08/21/2015 12:18:02 PM     Consult: Patient's case was consult with hospitalist for admission. MDM   Final diagnoses:  Anemia, unspecified anemia type  Severe comorbid illness   Patient presents with severe anemia. Patient has complex medical comorbidities of cardiac disease and complicated postoperative course with sepsis and cardiac arrest last month. Patient was sent to the emergency department for critical anemia. This has decreased to 5.9. At this point patient will be admitted for monitoring and transfusion.    Charlesetta Shanks, MD 08/21/15 [phone]

## 2015-08-21 NOTE — Progress Notes (Addendum)
Patient given 2 units of blood.  Order appears that 1 other unit needs to be released but 2 units have already been given.  Do not give the second unit.  Verified with MD that only 2 units are to be given.

## 2015-08-21 NOTE — ED Notes (Signed)
Paged Dr. [place] Pink for RN. Called about bed request, beds are being cleaned

## 2015-08-21 NOTE — ED Notes (Signed)
Patient belongings sent to floor with patient. Patient has his cell phone in pocket

## 2015-08-21 NOTE — H&P (Signed)
[facility] History and Physical  Jeremy Jennings JGG:[id number] DOB: 01-24-1936 DOA: 08/21/2015   PCP: [name], MD  Specialists: Patient is followed by [facility] gastroenterology. Cardiology: Dr. Mare Ferrari, oncology: Dr. Alen Blew  Chief Complaint: Low blood counts  HPI: Jeremy Jennings is a 80 y.o. male with a past medical history of prostate cancer, type 2 diabetes, hypertension, chronic diastolic congestive heart failure, hospitalization in December for small bowel obstruction requiring surgery. That hospitalization was complicated by cardiac arrest. Patient subsequently hospitalized in December 2016 with sepsis due to C. difficile colitis. He was discharged to skilled nursing facility. He was hospitalized at [facility] in January for anemia and was transfused 2 units of blood. He was discharged from the skilled nursing facility back home about a week ago. Patient had a follow-up appointment with his cardiologist yesterday. Blood work was done which revealed low blood counts. Patient was asked to come in to the hospital for transfusion. Patient denies any chest pain or shortness of breath at this time. He has had limited mobility after he has come home. He did have a fall on the day that he came home, but hasn't had any injuries or falls since then. He does admit to some generalized weakness. Denies any blood in the stool. Has been having soft stools ever since his surgery. Last bowel movement was 2 days ago.  Home Medications: Prior to Admission medications   Medication Sig Start Date End Date Taking? Authorizing Provider  acetaminophen (TYLENOL) 325 MG tablet Take 650 mg by mouth every 6 (six) hours as needed for mild pain or moderate pain.   Yes Historical Provider, MD  atorvastatin (LIPITOR) 10 MG tablet Take 10 mg by mouth daily.   Yes Historical Provider, MD  Cholecalciferol (VITAMIN D3) 5000 units TABS Take 5,000 Units by mouth daily.   Yes Historical Provider, MD    COMBIGAN 0.2-0.5 % ophthalmic solution Place 1 drop into the right eye daily.    Yes Historical Provider, MD  feeding supplement, ENSURE ENLIVE, (ENSURE ENLIVE) LIQD Take 237 mLs by mouth 2 (two) times daily between meals. 07/17/15  Yes Modena Jansky, MD  folic acid (FOLVITE) 1 MG tablet Take 1 mg by mouth daily.   Yes Historical Provider, MD  furosemide (LASIX) 40 MG tablet Take 40 mg by mouth daily.    Yes Historical Provider, MD  insulin aspart (NOVOLOG) 100 UNIT/ML injection Inject 0-9 Units into the skin 3 (three) times daily with meals as needed for high blood sugar. Pt uses per sliding scale:    70-150:  0 units  151-200:  1 unit  201-250:  2 units  251-300:  3 units  301-350:  5 units  351-400:  7 units  401-500:  9 units and notify MD   Yes Historical Provider, MD  insulin detemir (LEVEMIR) 100 UNIT/ML injection Inject 6 Units into the skin at bedtime.   Yes Historical Provider, MD  megestrol (MEGACE) 400 MG/10ML suspension Take 10 mLs (400 mg total) by mouth 2 (two) times daily. 05/18/15  Yes Wyatt Portela, MD  metoprolol (LOPRESSOR) 50 MG tablet Take 1 tablet (50 mg total) by mouth 2 (two) times daily. 07/27/15  Yes Dayna N Dunn, PA-C  ondansetron (ZOFRAN) 4 MG tablet Take 4 mg by mouth every 8 (eight) hours as needed for nausea or vomiting. As directed 07/03/15  Yes Historical Provider, MD  prednisoLONE acetate (PRED FORTE) 1 % ophthalmic suspension Place 1 drop into both eyes 2 (two)  times daily.  04/25/13  Yes Historical Provider, MD  saccharomyces boulardii (FLORASTOR) 250 MG capsule Take 1 capsule (250 mg total) by mouth 2 (two) times daily. 07/17/15  Yes Modena Jansky, MD  tamsulosin (FLOMAX) 0.4 MG CAPS capsule Take 0.4 mg by mouth daily.    Yes Historical Provider, MD  vitamin B-12 (CYANOCOBALAMIN) 1000 MCG tablet Take 1,000 mcg by mouth daily.   Yes Historical Provider, MD    Allergies: No Known Allergies  Past Medical History: Past Medical History  Diagnosis Date   . Essential hypertension   . Type 2 diabetes mellitus ([place])   . Hyperlipidemia   . Prostate cancer ([place])     Stage 4  . History of GI bleed   . Sinus tachycardia (HCC)     Chronic  . PAC (premature atrial contraction)     Frequent  . PVC's (premature ventricular contractions)   . Chronic diastolic CHF (congestive heart failure) ([place])   . Hypertensive heart disease   . Protein calorie malnutrition ([place])   . Anemia   . SBO (small bowel obstruction) ([place])     a. 06/2015: prolonged admissions for SBO s/p exlap/LOA c/b post-op ileus, fever, enterococcal UTI, a/c dCHF, acute encephalopathy, anemia, and readmission for septic shock due to C diff colitis.  Marland Kitchen PEA (Pulseless electrical activity) ([place])     a. 06/2015: in setting of acute respiratory failure, felt to be primary resp event.  . C. difficile colitis     a. 06/2015: a/w septic shock.  . Hypoalbuminemia     Past Surgical History  Procedure Laterality Date  . Tonsillectomy    . Hemorroidectomy  1969  . Esophagogastroduodenoscopy N/A 05/28/2015    Procedure: ESOPHAGOGASTRODUODENOSCOPY (EGD);  Surgeon: [name], MD;  Location: [name];  Service: Endoscopy;  Laterality: N/A;  . Inguinal hernia repair Bilateral AFE 16-17  . Hernia repair  1981    DR Wilkin  . Laparotomy N/A 06/12/2015    Procedure: EXPLORATORY LAPAROTOMY;  Surgeon: [name], MD;  Location: [facility];  Service: General;  Laterality: N/A;  . Bowel resection N/A 06/12/2015    Procedure: SMALL BOWEL RESECTION;  Surgeon: [name], MD;  Location: [facility];  Service: General;  Laterality: N/A;    Social History: Patient currently is living in [place]. His children take care of him at home. Quit smoking many years ago. No alcohol use. No illicit drug use. Uses a walker to ambulate  Family History:  Family History  Problem Relation Age of Onset  . Cancer Neg Hx   . Heart attack Neg Hx     unknown  . Stroke Neg Hx     unknown  . Kidney  failure Brother   . Kidney failure Sister   . Diabetes Mother   . Hypertension Father      Review of Systems - History obtained from the patient General ROS: positive for  - fatigue Psychological ROS: negative Ophthalmic ROS: negative ENT ROS: negative Allergy and Immunology ROS: negative Hematological and Lymphatic ROS: negative Endocrine ROS: negative Respiratory ROS: no cough, shortness of breath, or wheezing Cardiovascular ROS: no chest pain or dyspnea on exertion Gastrointestinal ROS: no abdominal pain, change in bowel habits, or black or bloody stools Genito-Urinary ROS: no dysuria, trouble voiding, or hematuria Musculoskeletal ROS: negative Neurological ROS: no TIA or stroke symptoms Dermatological ROS: negative  Physical Examination  Filed Vitals:   08/21/15 0935 08/21/15 0953 08/21/15 1110 08/21/15 1133  BP: 103/45 106/47 127/56 131/55  Pulse: 103 96 95 97  Temp: 97.3 F (36.3 C)     TempSrc: Oral     Resp: _0 SpO2: 100% 100% 100% 100%    BP 131/55 mmHg  Pulse 97  Temp(Src) 97.3 F (36.3 C) (Oral)  Resp 20  SpO2 100%  General appearance: alert, cooperative, appears stated age and no distress Head: Normocephalic, without obvious abnormality, atraumatic Eyes: conjunctivae/corneas clear. PERRL, EOM's intact. Throat: lips, mucosa, and tongue normal; teeth and gums normal Neck: no adenopathy, no carotid bruit, no JVD, supple, symmetrical, trachea midline and thyroid not enlarged, symmetric, no tenderness/mass/nodules Resp: clear to auscultation bilaterally Cardio: regular rate and rhythm, S1, S2 normal, no murmur, click, rub or gallop GI: soft, non-tender; bowel sounds normal; no masses,  no organomegaly Extremities: extremities normal, atraumatic, no cyanosis or edema. No obvious limitation in passive range of motion of the hips bilaterally. Pulses: 2+ and symmetric Skin: Skin color, texture, turgor normal. No rashes or lesions Lymph nodes:  Cervical, supraclavicular, and axillary nodes normal. Neurologic: Alert and oriented 3. Cranial nerve II-12 intact. Normal motor strength bilateral upper extremities. Unable to lift either of his legs off the bed. Good strength at the ankle flexors bilaterally. Reflexes appear to be normal.  Laboratory Data: Results for orders placed or performed during the hospital encounter of 08/21/15 (from the past 48 hour(s))  Type and screen [place]     Status: None   Collection Time: 08/21/15 10:05 AM  Result Value Ref Range   ABO/RH(D) B POS    Antibody Screen NEG    Sample Expiration 08/24/2015   POC occult blood, ED Provider will collect     Status: None   Collection Time: 08/21/15 10:19 AM  Result Value Ref Range   Fecal Occult Bld NEGATIVE NEGATIVE  Protime-INR     Status: Abnormal   Collection Time: 08/21/15 10:25 AM  Result Value Ref Range   Prothrombin Time 16.1 (H) 11.6 - 15.2 seconds   INR 1.33 0.00 - 1.61  Basic metabolic panel     Status: Abnormal   Collection Time: 08/21/15 11:02 AM  Result Value Ref Range   Sodium 141 135 - 145 mmol/L   Potassium 3.5 3.5 - 5.1 mmol/L   Chloride 110 101 - 111 mmol/L   CO2 20 (L) 22 - 32 mmol/L   Glucose, Bld 225 (H) 65 - 99 mg/dL   BUN 33 (H) 6 - 20 mg/dL   Creatinine, Ser 1.09 0.61 - 1.24 mg/dL   Calcium 7.5 (L) 8.9 - 10.3 mg/dL   GFR calc non Af Amer >60 >60 mL/min   GFR calc Af Amer >60 >60 mL/min    Comment: (NOTE) The eGFR has been calculated using the CKD EPI equation. This calculation has not been validated in all clinical situations. eGFR's persistently <60 mL/min signify possible Chronic Kidney Disease.    Anion gap 11 5 - 15  CBC with Differential/Platelet     Status: Abnormal   Collection Time: 08/21/15 11:02 AM  Result Value Ref Range   WBC 6.6 4.0 - 10.5 K/uL   RBC 1.95 (L) 4.22 - 5.81 MIL/uL   Hemoglobin 5.9 (LL) 13.0 - 17.0 g/dL    Comment: REPEATED TO VERIFY CRITICAL RESULT CALLED TO, READ BACK  BY AND VERIFIED WITH: WORKMAN,H. RN _1  ON 2.10.17 BY MCCOY,N.    HCT 17.7 (L) 39.0 - 52.0 %   MCV 90.8 78.0 - 100.0 fL   MCH 30.3 26.0 -  34.0 pg   MCHC 33.3 30.0 - 36.0 g/dL   RDW 18.9 (H) 11.5 - 15.5 %   Platelets 123 (L) 150 - 400 K/uL   Neutrophils Relative % 80 %   Lymphocytes Relative 14 %   Monocytes Relative 5 %   Eosinophils Relative 1 %   Basophils Relative 0 %   Neutro Abs 5.3 1.7 - 7.7 K/uL   Lymphs Abs 0.9 0.7 - 4.0 K/uL   Monocytes Absolute 0.3 0.1 - 1.0 K/uL   Eosinophils Absolute 0.1 0.0 - 0.7 K/uL   Basophils Absolute 0.0 0.0 - 0.1 K/uL   RBC Morphology TARGET CELLS     Comment: POLYCHROMASIA PRESENT   WBC Morphology MILD LEFT SHIFT (1-5% METAS, OCC MYELO, OCC BANDS)     Radiology Reports: No results found.  My interpretation of Electrocardiogram: Sinus rhythm in the 90s. Normal axis. PACs are noted. No concerning ST or T-wave changes  Problem List  Principal Problem:   Symptomatic anemia Active Problems:   Essential hypertension   Prostate cancer (HCC)   Type 2 diabetes mellitus (HCC)   Chronic diastolic CHF (congestive heart failure) (HCC)   Severe comorbid illness   Assessment: This is a 80 year old African-American male with the multiple medical problems who recently came home from skilled nursing facility after prolonged rehabilitation and was detected to have low hemoglobin at a routine follow-up. Reason for the anemia is not entirely clear. He has heme-negative stool. No other overt bleeding has been identified. This is the second time in the last 4 weeks, that he will be requiring blood transfusions.  Plan: #1 Symptomatic anemia: Anemia panel done recently was reviewed. No evidence for iron deficiency. B-12 level was also not low. Patient will be transfused 2 units of blood to begin with. Repeat hemoglobin tomorrow morning. He has undergone extensive GI evaluation in the recent past including EGD in November and colonoscopy in December. Discussed  with Dr. Penelope Coop with the gastroenterology. He does not feel that the patient needs any inpatient workup at this time. I think the patient will need a hematology input for persistent anemia, which can be pursued as an outpatient.  #2 History of type 2 diabetes: Continue insulin and start him on sliding scale coverage as well. HbA1c was 6.3 in November.  #3 history of essential hypertension: Stable. Continue home medications.  #4 chronic diastolic congestive heart failure: Patient is well compensated. He'll be given Lasix in between. His blood transfusions. Continue his oral Lasix as well.  #5 history of prostate cancer: He is followed closely by oncology. He is not on any treatment at this time. Oncology to consider reinitiating his treatment at next follow-up.  #6 Recent complicated hospitalizations: He was hospitalized in December for small bowel obstruction and underwent small bowel resection. This was complicated by transient cardiac arrest/PEA. Patient was sent to SNF. He presented again in December with sepsis due to C. difficile. All these issues appear to be stable at this time.  Patient still appears to be deconditioned. PT and OT will be consulted. He apparently is getting home health therapy.  DVT Prophylaxis: SCDs Code Status: Full code Family Communication: Discussed with the patient  Disposition Plan: Admit to telemetry   Further management decisions will depend on results of further testing and patient's response to treatment.   [facility]  [facility] Pager [phone]  If 7PM-7AM, please contact night-coverage [email or web address] Password [facility]  08/21/2015, 12:25 PM

## 2015-08-22 LAB — COMPREHENSIVE METABOLIC PANEL
ALBUMIN: 1.6 g/dL — AB (ref 3.5–5.0)
ALT: 17 U/L (ref 17–63)
AST: 21 U/L (ref 15–41)
Alkaline Phosphatase: 516 U/L — ABNORMAL HIGH (ref 38–126)
Anion gap: 11 (ref 5–15)
BILIRUBIN TOTAL: 0.6 mg/dL (ref 0.3–1.2)
BUN: 35 mg/dL — AB (ref 6–20)
CHLORIDE: 109 mmol/L (ref 101–111)
CO2: 20 mmol/L — ABNORMAL LOW (ref 22–32)
CREATININE: 0.96 mg/dL (ref 0.61–1.24)
Calcium: 7.3 mg/dL — ABNORMAL LOW (ref 8.9–10.3)
GFR calc Af Amer: >60 mL/min (ref >60)
GLUCOSE: 208 mg/dL — AB (ref 65–99)
POTASSIUM: 3.1 mmol/L — AB (ref 3.5–5.1)
Sodium: 140 mmol/L (ref 135–145)
Total Protein: 5.9 g/dL — ABNORMAL LOW (ref 6.5–8.1)

## 2015-08-22 LAB — CBC
HEMATOCRIT: 23.8 % — AB (ref 39.0–52.0)
HEMATOCRIT: 29.9 % — AB (ref 39.0–52.0)
HEMOGLOBIN: 10.2 g/dL — AB (ref 13.0–17.0)
Hemoglobin: 7.8 g/dL — ABNORMAL LOW (ref 13.0–17.0)
MCH: 28.8 pg (ref 26.0–34.0)
MCH: 28.9 pg (ref 26.0–34.0)
MCHC: 32.8 g/dL (ref 30.0–36.0)
MCHC: 34.1 g/dL (ref 30.0–36.0)
MCV: 87.8 fL (ref 78.0–100.0)
MCV: 84.7 fL (ref 78.0–100.0)
Platelets: 124 10*3/uL — ABNORMAL LOW (ref 150–400)
Platelets: 109 10*3/uL — ABNORMAL LOW (ref 150–400)
RBC: 2.71 MIL/uL — ABNORMAL LOW (ref 4.22–5.81)
RBC: 3.53 MIL/uL — ABNORMAL LOW (ref 4.22–5.81)
RDW: 20.1 % — AB (ref 11.5–15.5)
RDW: 18 % — ABNORMAL HIGH (ref 11.5–15.5)
WBC: 7.8 10*3/uL (ref 4.0–10.5)
WBC: 8.6 10*3/uL (ref 4.0–10.5)

## 2015-08-22 LAB — GLUCOSE, CAPILLARY
GLUCOSE-CAPILLARY: 154 mg/dL — AB (ref 65–99)
GLUCOSE-CAPILLARY: 180 mg/dL — AB (ref 65–99)
Glucose-Capillary: 167 mg/dL — ABNORMAL HIGH (ref 65–99)
Glucose-Capillary: 106 mg/dL — ABNORMAL HIGH (ref 65–99)

## 2015-08-22 LAB — PREPARE RBC (CROSSMATCH)

## 2015-08-22 MED ORDER — FUROSEMIDE 10 MG/ML IJ SOLN
20.0000 mg | Freq: Once | INTRAMUSCULAR | Status: AC
  Administered 2015-08-22: 20 mg via INTRAVENOUS
  Filled 2015-08-22: qty 2

## 2015-08-22 MED ORDER — POTASSIUM CHLORIDE CRYS ER 20 MEQ PO TBCR
40.0000 meq | EXTENDED_RELEASE_TABLET | Freq: Once | ORAL | Status: AC
  Administered 2015-08-22: 40 meq via ORAL
  Filled 2015-08-22: qty 2

## 2015-08-22 MED ORDER — SODIUM CHLORIDE 0.9 % IV SOLN
Freq: Once | INTRAVENOUS | Status: AC
  Administered 2015-08-22: 09:00:00 via INTRAVENOUS

## 2015-08-22 MED ORDER — TRAMADOL HCL 50 MG PO TABS
50.0000 mg | ORAL_TABLET | Freq: Once | ORAL | Status: AC
Start: 2015-08-22 — End: 2015-08-22
  Administered 2015-08-22: 50 mg via ORAL
  Filled 2015-08-22: qty 1

## 2015-08-22 NOTE — Evaluation (Signed)
Physical Therapy Evaluation Patient Details Name: Jeremy Jennings MRN: [id number] DOB: Nov 07, 1935 Today's Date: 08/22/2015   History of Present Illness  80 yo male admitted with symptomatic anemia. Hx of prostate cancer, DM, HTN, falls. Recent stay at SNF.   Clinical Impression  On eval, pt required Min assist for mobility-walked ~125 feet with RW. Increased time to complete tasks. Pt c/o L pelvic/thigh pain limiting mobility at times. Highly recommend 24 hour care for safety.     Follow Up Recommendations Home health PT;Supervision/Assistance - 24 hour    Equipment Recommendations  None recommended by PT    Recommendations for Other Services OT consult     Precautions / Restrictions Precautions Precautions: Fall Restrictions Weight Bearing Restrictions: No      Mobility  Bed Mobility Overal bed mobility: Needs Assistance Bed Mobility: Supine to Sit     Supine to sit: Min assist;HOB elevated     General bed mobility comments: Assist for trunk to upright. Increased time. Used bedrail  Transfers Overall transfer level: Needs assistance Equipment used: Rolling walker (2 wheeled) Transfers: Sit to/from Stand Sit to Stand: From elevated surface;Min assist         General transfer comment: highly elevated bed surface. Assist to rise, stabilize. Increased time  Ambulation/Gait Ambulation/Gait assistance: Min guard Ambulation Distance (Feet): 125 Feet Assistive device: Rolling walker (2 wheeled) Gait Pattern/deviations: Step-through pattern;Decreased stride length     General Gait Details: slow gait speed. close guard.   Stairs            Wheelchair Mobility    Modified Rankin (Stroke Patients Only)       Balance Overall balance assessment: Needs assistance         Standing balance support: Bilateral upper extremity supported;During functional activity Standing balance-Leahy Scale: Poor Standing balance comment: requires use of RW                              Pertinent Vitals/Pain Pain Assessment: Faces Faces Pain Scale: Hurts little more Pain Location: L thigh Pain Descriptors / Indicators: Sore Pain Intervention(s): Monitored during session    Home Living Family/patient expects to be discharged to:: Private residence Living Arrangements: Children Available Help at Discharge: Family;Available PRN/intermittently Type of Home: House Home Access: Stairs to enter   [employer] of Steps: 2 Home Layout: One level Home Equipment: Crutches;Cane - single point;Walker - 2 wheels;Shower seat - built in      Prior Function Level of Independence: Needs assistance   Gait / Transfers Assistance Needed: uses walker           Hand Dominance        Extremity/Trunk Assessment   Upper Extremity Assessment: Defer to OT evaluation           Lower Extremity Assessment: Generalized weakness      Cervical / Trunk Assessment: Normal  Communication   Communication: No difficulties  Cognition Arousal/Alertness: Awake/alert Behavior During Therapy: WFL for tasks assessed/performed Overall Cognitive Status: Within Functional Limits for tasks assessed                      General Comments      Exercises        Assessment/Plan    PT Assessment Patient needs continued PT services  PT Diagnosis Difficulty walking;Generalized weakness;Acute pain   PT Problem List Decreased strength;Decreased activity tolerance;Decreased balance;Decreased mobility;Pain;Decreased knowledge of use of DME  PT  Treatment Interventions DME instruction;Gait training;Functional mobility training;Therapeutic activities;Patient/family education;Balance training;Therapeutic exercise   PT Goals (Current goals can be found in the Care Plan section) Acute Rehab PT Goals Patient Stated Goal: home soon PT Goal Formulation: With patient Time For Goal Achievement: 09/05/15 Potential to Achieve Goals: Fair    Frequency  Min 3X/week   Barriers to discharge        Co-evaluation               End of Session Equipment Utilized During Treatment: Gait belt Activity Tolerance: Patient limited by pain Patient left:  (with OT- took over session)           Time: 1500-1520 PT Time Calculation (min) (ACUTE ONLY): 20 min   Charges:   PT Evaluation $PT Eval Moderate Complexity: 1 Procedure     PT G Codes:        Weston Anna, MPT Pager: [phone]

## 2015-08-22 NOTE — Progress Notes (Signed)
Occupational Therapy Evaluation Patient Details Name: Jeremy Jennings MRN: [id number] DOB: 09-13-1935 Today's Date: 08/22/2015    History of Present Illness 80 yo male admitted with symptomatic anemia. Hx of prostate cancer, DM, HTN, falls. Recent stay at SNF.    Clinical Impression   Pt admitted with the above diagnoses and presents with below problem list. Pt will benefit from continued acute OT to address the below listed deficits and maximize independence with BADLs prior to d/c to venue below. PTA pt was mod I with ADLs. Pt is currently min guard to min A with LB ADLs, functional mobility and transfers. OT to continue to follow acutely.      Follow Up Recommendations  Home health OT;Supervision/Assistance - 24 hour    Equipment Recommendations  3 in 1 bedside comode    Recommendations for Other Services       Precautions / Restrictions Precautions Precautions: Fall Restrictions Weight Bearing Restrictions: No      Mobility Bed Mobility Overal bed mobility: Needs Assistance Bed Mobility: Sit to Supine     Supine to sit: Min assist;HOB elevated Sit to supine: Min assist   General bed mobility comments: Assist to advance BLE onto bed. Pt reports he has family to help with this at home and he usually does not need help with this task.  Transfers Overall transfer level: Needs assistance Equipment used: Rolling walker (2 wheeled) Transfers: Sit to/from Stand Sit to Stand: From elevated surface;Min assist         General transfer comment: steadying assist to sit EOB. elevated bed height.    Balance Overall balance assessment: Needs assistance Sitting-balance support: No upper extremity supported Sitting balance-Leahy Scale: Fair     Standing balance support: Bilateral upper extremity supported;During functional activity Standing balance-Leahy Scale: Poor Standing balance comment: external support for balance                            ADL  Overall ADL's : Needs assistance/impaired Eating/Feeding: Set up;Sitting   Grooming: Min guard;Set up;Sitting;Standing   Upper Body Bathing: Set up;Sitting   Lower Body Bathing: Min A;Sit to/from stand   Upper Body Dressing : Set up;Sitting   Lower Body Dressing: Min A;Sit to/from stand   Toilet Transfer: Min A;Ambulation;BSC;RW   Toileting- Clothing Manipulation and Hygiene: Set up;Min guard;Sitting/lateral lean;Sit to/from stand   Tub/ Shower Transfer: Walk-in shower;Min A;Ambulation;3 in 1;Rolling walker   Functional mobility during ADLs: Min guard;Rolling walker General ADL Comments: Pt received from PT after ambulating in the hallway. Pt sat EOB about 5 minutes. Bed mobiility completed. Discussed home setup and ADL education provided.      Vision     Perception     Praxis      Pertinent Vitals/Pain Pain Assessment: Faces Faces Pain Scale: Hurts little more Pain Location: L thigh Pain Descriptors / Indicators: Sore Pain Intervention(s): Monitored during session     Hand Dominance     Extremity/Trunk Assessment Upper Extremity Assessment Upper Extremity Assessment: Generalized weakness;Overall [facility] for tasks assessed   Lower Extremity Assessment Lower Extremity Assessment: Defer to PT evaluation   Cervical / Trunk Assessment Cervical / Trunk Assessment: Normal   Communication Communication Communication: No difficulties   Cognition Arousal/Alertness: Awake/alert Behavior During Therapy: WFL for tasks assessed/performed Overall Cognitive Status: Within Functional Limits for tasks assessed                     General Comments  Exercises       Shoulder Instructions      Home Living Family/patient expects to be discharged to:: Private residence Living Arrangements: Children Available Help at Discharge: Family;Available PRN/intermittently Type of Home: House Home Access: Stairs to enter [employer] of Steps: 2   Home  Layout: One level     Bathroom Shower/Tub: Occupational psychologist: Handicapped height     Home Equipment: Crutches;Cane - single point;Walker - 2 wheels;Shower seat - built in          Prior Functioning/Environment Level of Independence: Needs assistance  Gait / Transfers Assistance Needed: uses walker          OT Diagnosis: Generalized weakness;Acute pain   OT Problem List: Impaired balance (sitting and/or standing);Decreased knowledge of use of DME or AE;Decreased knowledge of precautions;Pain   OT Treatment/Interventions: Self-care/ADL training;DME and/or AE instruction;Therapeutic activities;Patient/family education;Balance training    OT Goals(Current goals can be found in the care plan section) Acute Rehab OT Goals Patient Stated Goal: home soon OT Goal Formulation: With patient Time For Goal Achievement: 08/29/15 Potential to Achieve Goals: Good ADL Goals Pt Will Perform Lower Body Bathing: with modified independence;with adaptive equipment;sit to/from stand Pt Will Perform Lower Body Dressing: with modified independence;with adaptive equipment;sit to/from stand Pt Will Perform Tub/Shower Transfer: Shower transfer;with supervision;ambulating;3 in 1;rolling walker  OT Frequency: Min 2X/week   Barriers to D/C:            Co-evaluation              End of Session Equipment Utilized During Treatment: Gait belt;Rolling walker  Activity Tolerance: Patient tolerated treatment well Patient left: in bed;with call bell/phone within reach;with SCD's reapplied   Time: 1521-1531 OT Time Calculation (min): 10 min Charges:  OT General Charges $OT Visit: 1 Procedure OT Evaluation $OT Eval Low Complexity: 1 Procedure G-Codes:    Hortencia Pilar 2015/09/06, 4:49 PM

## 2015-08-22 NOTE — Progress Notes (Signed)
OT Cancellation Note  Patient Details Name: Jeremy Jennings MRN: [id number] DOB: [birth year]   Cancelled Treatment:    Reason Eval/Treat Not Completed: Other (comment) (Pt with PT.) OT to reattempt as schedule permits.  Hortencia Pilar 08/22/2015, 3:17 PM

## 2015-08-22 NOTE — Discharge Instructions (Signed)
Anemia, Nonspecific Anemia is a condition in which the concentration of red blood cells or hemoglobin in the blood is below normal. Hemoglobin is a substance in red blood cells that carries oxygen to the tissues of the body. Anemia results in not enough oxygen reaching these tissues.  CAUSES  Common causes of anemia include:   Excessive bleeding. Bleeding may be internal or external. This includes excessive bleeding from periods (in women) or from the intestine.   Poor nutrition.   Chronic kidney, thyroid, and liver disease.  Bone marrow disorders that decrease red blood cell production.  Cancer and treatments for cancer.  HIV, AIDS, and their treatments.  Spleen problems that increase red blood cell destruction.  Blood disorders.  Excess destruction of red blood cells due to infection, medicines, and autoimmune disorders. SIGNS AND SYMPTOMS   Minor weakness.   Dizziness.   Headache.  Palpitations.   Shortness of breath, especially with exercise.   Paleness.  Cold sensitivity.  Indigestion.  Nausea.  Difficulty sleeping.  Difficulty concentrating. Symptoms may occur suddenly or they may develop slowly.  DIAGNOSIS  Additional blood tests are often needed. These help your health care provider determine the best treatment. Your health care provider will check your stool for blood and look for other causes of blood loss.  TREATMENT  Treatment varies depending on the cause of the anemia. Treatment can include:   Supplements of iron, vitamin B12, or folic acid.   Hormone medicines.   A blood transfusion. This may be needed if blood loss is severe.   Hospitalization. This may be needed if there is significant continual blood loss.   Dietary changes.  Spleen removal. HOME CARE INSTRUCTIONS Keep all follow-up appointments. It often takes many weeks to correct anemia, and having your health care provider check on your condition and your response to  treatment is very important. SEEK IMMEDIATE MEDICAL CARE IF:   You develop extreme weakness, shortness of breath, or chest pain.   You become dizzy or have trouble concentrating.  You develop heavy vaginal bleeding.   You develop a rash.   You have bloody or black, tarry stools.   You faint.   You vomit up blood.   You vomit repeatedly.   You have abdominal pain.  You have a fever or persistent symptoms for more than 2-3 days.   You have a fever and your symptoms suddenly get worse.   You are dehydrated.  MAKE SURE YOU:  Understand these instructions.  Will watch your condition.  Will get help right away if you are not doing well or get worse.   This information is not intended to replace advice given to you by your health care provider. Make sure you discuss any questions you have with your health care provider.   Document Released: 08/04/2004 Document Revised: 02/27/2013 Document Reviewed: 12/21/2012 Elsevier Interactive Patient Education 2016 Elsevier Inc.  

## 2015-08-22 NOTE — Discharge Summary (Signed)
[facility]  Physician Discharge Summary   Patient ID: Jeremy Jennings MRN: [id number] DOB/AGE: [birth year] 80 y.o.  Admit date: 08/21/2015 Discharge date: 08/22/2015  PCP: [name], MD  DISCHARGE DIAGNOSES:  Principal Problem:   Symptomatic anemia Active Problems:   Essential hypertension   Prostate cancer ([place])   Type 2 diabetes mellitus (HCC)   Chronic diastolic CHF (congestive heart failure) (HCC)   Severe comorbid illness   RECOMMENDATIONS FOR OUTPATIENT FOLLOW UP: 1. Patient will benefit from hematology consult to determine etiology for his anemia. He is already seen by Dr. Alen Blew. 2. Patient also was to follow-up with his gastroenterologist 3. Home health to be resumed  DISCHARGE CONDITION: fair  Diet recommendation: As before  [facility] Weights   08/21/15 1548 08/22/15 0545  Weight: 72.485 kg (159 lb 12.8 oz) 71.26 kg (157 lb 1.6 oz)    INITIAL HISTORY: Jeremy Jennings is a 80 y.o. male with a past medical history of prostate cancer, type 2 diabetes, hypertension, chronic diastolic congestive heart failure, hospitalization in December for small bowel obstruction requiring surgery. That hospitalization was complicated by cardiac arrest. Patient subsequently hospitalized in December 2016 with sepsis due to C. difficile colitis. He was discharged to skilled nursing facility. He was hospitalized at [facility] in January for anemia and was transfused 2 units of blood. He was discharged from the skilled nursing facility back home about a week ago. Patient had a follow-up appointment with his cardiologist and blood work was done which revealed low blood counts. Patient was asked to come in to the hospital for transfusion. No overt bleeding was noted.  Consultations:  Phone discussion with gastroenterology  Procedures:  Transfused 4 units of blood  HOSPITAL COURSE:   Symptomatic anemia Anemia panel done recently was reviewed. No evidence for  iron deficiency. B-12 level was also not low. Patient was transfused 4 units of blood. He has undergone extensive GI evaluation in the recent past including EGD in November and colonoscopy in December. Discussed with Dr. Penelope Coop with the gastroenterology. He does not feel that the patient needs any inpatient workup at this time. I think the patient will need a hematology input for persistent anemia, which can be pursued as an outpatient. He is already followed by Dr. Alen Blew who can evaluate his anemia as well.  History of type 2 diabetes HbA1c was 6.3 in November. He may resume his home medication regimen  History of essential hypertension Stable. Continue home medications.  Chronic diastolic congestive heart failure Patient is well compensated. He was given Lasix in between his blood transfusions. Continue his home medication regimen.   History of prostate cancer He is followed closely by oncology. He is not on any treatment at this time. Oncology to consider reinitiating his treatment at next follow-up.  Recent complicated hospitalizations He was hospitalized in December for small bowel obstruction and underwent small bowel resection. This was complicated by transient cardiac arrest/PEA. Patient was sent to SNF. He presented again in December with sepsis due to C. difficile. All these issues appear to be stable at this time.  Patient still appeared to be deconditioned. PT and OT was consulted. Home health will be resumed.   If posttransfusion hemoglobin is greater than 8, he can be discharged tonight. Importance of Follow-up has been explained to the patient.     PERTINENT LABS:  The results of significant diagnostics from this hospitalization (including imaging, microbiology, ancillary and laboratory) are listed below for reference.  Labs: Basic Metabolic Panel:  Recent Labs Lab 08/20/15 1607 08/21/15 1102 08/22/15 0527  NA 136 141 140  K 3.4* 3.5 3.1*  CL 107 110 109  CO2  16* 20* 20*  GLUCOSE 186* 225* 208*  BUN 32* 33* 35*  CREATININE 1.08 1.09 0.96  CALCIUM 7.3* 7.5* 7.3*   Liver Function Tests:  Recent Labs Lab 08/22/15 0527  AST 21  ALT 17  ALKPHOS 516*  BILITOT 0.6  PROT 5.9*  ALBUMIN 1.6*   CBC:  Recent Labs Lab 08/20/15 1607 08/21/15 1102 08/22/15 0527  WBC 6.5 6.6 7.8  NEUTROABS 4.7 5.3  --   HGB 6.7* 5.9* 7.8*  HCT 19.3* 17.7* 23.8*  MCV 88.5 90.8 87.8  PLT 135* 123* 124*    CBG:  Recent Labs Lab 08/21/15 1658 08/21/15 2053 08/22/15 0753 08/22/15 1148  GLUCAP 169* 217* 167* 154*    IMAGING STUDIES No results found.  DISCHARGE EXAMINATION: Filed Vitals:   08/22/15 1241 08/22/15 1343 08/22/15 1410 08/22/15 1642  BP: 118/56 121/73 106/60 128/60  Pulse: 86 87 86 89  Temp: 98.3 F (36.8 C) 98.1 F (36.7 C) 98.2 F (36.8 C) 98.2 F (36.8 C)  TempSrc: Oral Oral Oral Oral  Resp: 20 18 20 18   Height:      Weight:      SpO2: 100% 100% 100% 100%   General appearance: alert, cooperative, appears stated age and no distress Resp: clear to auscultation bilaterally Cardio: regular rate and rhythm, S1, S2 normal, no murmur, click, rub or gallop GI: soft, non-tender; bowel sounds normal; no masses,  no organomegaly Extremities: extremities normal, atraumatic, no cyanosis or edema  DISPOSITION: Home with home health  Discharge Instructions    Call MD for:  difficulty breathing, headache or visual disturbances    Complete by:  As directed      Call MD for:  extreme fatigue    Complete by:  As directed      Call MD for:  persistant dizziness or light-headedness    Complete by:  As directed      Call MD for:  persistant nausea and vomiting    Complete by:  As directed      Call MD for:  severe uncontrolled pain    Complete by:  As directed      Discharge instructions    Complete by:  As directed   You will need to follow-up with Dr. Alen Blew to determine why he you have anemia. He will also need to follow-up with Dr.  Watt Climes with gastroenterology to see if additional testing is needed. You will need to have blood work in one week to make sure your hemoglobin is stable. Please have this done by her primary care physician.  You were cared for by a hospitalist during your hospital stay. If you have any questions about your discharge medications or the care you received while you were in the hospital after you are discharged, you can call the unit and asked to speak with the hospitalist on call if the hospitalist that took care of you is not available. Once you are discharged, your primary care physician will handle any further medical issues. Please note that NO REFILLS for any discharge medications will be authorized once you are discharged, as it is imperative that you return to your primary care physician (or establish a relationship with a primary care physician if you do not have one) for your aftercare needs so that they can reassess your  need for medications and monitor your lab values. If you do not have a primary care physician, you can call [phone] for a physician referral.     Increase activity slowly    Complete by:  As directed            ALLERGIES: No Known Allergies   Current Discharge Medication List    CONTINUE these medications which have NOT CHANGED   Details  acetaminophen (TYLENOL) 325 MG tablet Take 650 mg by mouth every 6 (six) hours as needed for mild pain or moderate pain.    atorvastatin (LIPITOR) 10 MG tablet Take 10 mg by mouth daily.    Cholecalciferol (VITAMIN D3) 5000 units TABS Take 5,000 Units by mouth daily.    COMBIGAN 0.2-0.5 % ophthalmic solution Place 1 drop into the right eye daily.     feeding supplement, ENSURE ENLIVE, (ENSURE ENLIVE) LIQD Take 237 mLs by mouth 2 (two) times daily between meals.    folic acid (FOLVITE) 1 MG tablet Take 1 mg by mouth daily.    furosemide (LASIX) 40 MG tablet Take 40 mg by mouth daily.     insulin aspart (NOVOLOG) 100 UNIT/ML  injection Inject 0-9 Units into the skin 3 (three) times daily with meals as needed for high blood sugar. Pt uses per sliding scale:    70-150:  0 units  151-200:  1 unit  201-250:  2 units  251-300:  3 units  301-350:  5 units  351-400:  7 units  401-500:  9 units and notify MD    insulin detemir (LEVEMIR) 100 UNIT/ML injection Inject 6 Units into the skin at bedtime.    megestrol (MEGACE) 400 MG/10ML suspension Take 10 mLs (400 mg total) by mouth 2 (two) times daily. Qty: 240 mL, Refills: 0   Associated Diagnoses: Prostate cancer ([place]); Cancer (HCC)    metoprolol (LOPRESSOR) 50 MG tablet Take 1 tablet (50 mg total) by mouth 2 (two) times daily. Qty: 180 tablet, Refills: 3   Associated Diagnoses: Chronic diastolic CHF (congestive heart failure) ([place]); Hypoalbuminemia; Elevated troponin; Anemia, unspecified; Sinus tachycardia ([place]); Prolonged Q-T interval on ECG; Hypotension, unspecified hypotension type    ondansetron (ZOFRAN) 4 MG tablet Take 4 mg by mouth every 8 (eight) hours as needed for nausea or vomiting. As directed Refills: 0   Associated Diagnoses: Prostate cancer ([place])    prednisoLONE acetate (PRED FORTE) 1 % ophthalmic suspension Place 1 drop into both eyes 2 (two) times daily.     saccharomyces boulardii (FLORASTOR) 250 MG capsule Take 1 capsule (250 mg total) by mouth 2 (two) times daily.    tamsulosin (FLOMAX) 0.4 MG CAPS capsule Take 0.4 mg by mouth daily.     vitamin B-12 (CYANOCOBALAMIN) 1000 MCG tablet Take 1,000 mcg by mouth daily.       Follow-up Information    Follow up with [facility], MD. Schedule an appointment as soon as possible for a visit in 2 weeks.   Specialty:  Oncology   Why:  post hospitalization follow up and for further workup of anemia   Contact information:   [address] [address] [phone]       Follow up with [facility], MD. Schedule an appointment as soon as possible for a visit in 2 weeks.   Specialty:   Gastroenterology   Why:  To see if he wants to do any further workup   Contact information:   [address]  [place]       TOTAL DISCHARGE TIME: 35 minutes  [facility]  [facility] Pager [phone]  08/22/2015, 5:39 PM

## 2015-08-23 LAB — GLUCOSE, CAPILLARY
GLUCOSE-CAPILLARY: 105 mg/dL — AB (ref 65–99)
GLUCOSE-CAPILLARY: 69 mg/dL (ref 65–99)

## 2015-08-23 LAB — CBC
HEMATOCRIT: 30 % — AB (ref 39.0–52.0)
HEMOGLOBIN: 10.3 g/dL — AB (ref 13.0–17.0)
MCH: 29.3 pg (ref 26.0–34.0)
MCHC: 34.3 g/dL (ref 30.0–36.0)
MCV: 85.2 fL (ref 78.0–100.0)
Platelets: 111 10*3/uL — ABNORMAL LOW (ref 150–400)
RBC: 3.52 MIL/uL — ABNORMAL LOW (ref 4.22–5.81)
RDW: 18.7 % — AB (ref 11.5–15.5)
WBC: 9.1 10*3/uL (ref 4.0–10.5)

## 2015-08-23 NOTE — Progress Notes (Signed)
Pt discharged to home. DC instructions given with daughter at bedside. No prescriptions given. Daughter signed DC summary. No concerns voiced. Pt left unit in wheelchair pushed by nurse tech Naaman Plummer) accompanied by daughter.  Left in good condition. VWilliams,rn.

## 2015-08-23 NOTE — Progress Notes (Signed)
Patient was discharged yesterday, but it was late in the day and he was kept overnight. Family is in the room this morning. Patient feels well and wants to go home. Denies any complaints. No shortness of breath. Examination is benign. His lungs are clear to auscultation. He is stable for discharge this morning. Please see yesterday's discharge summary for details.  Bonnielee Haff 08/23/2015

## 2015-08-24 ENCOUNTER — Telehealth: Payer: Self-pay | Admitting: Cardiology

## 2015-08-24 NOTE — Telephone Encounter (Signed)
Discussed with  Dr. Mare Ferrari and ok to give verbal order but patient must keep appointment in March to get established with PCP Left message for Wes to call back

## 2015-08-24 NOTE — Care Management Note (Addendum)
Case Management Note  Patient Details  Name: Jeremy Jennings MRN: [id number] Date of Birth: [birth year]  Subjective/Objective:    Symptomatic anemia                Action/Plan:  NCM contacted dtr, Valaria Good to arrange [facility] PT/OT. Pt was recently dc from [place] and [facility] was arranged. States pt is active with [place] for [facility]. Has RW at home. Notified [employer].    Expected Discharge Date: 08/23/2015              Expected Discharge Plan:  [place]  In-House Referral:  NA  Discharge planning Services  CM Consult  Post Acute Care Choice:  Home Health, Resumption of Svcs/PTA Provider Choice offered to:  Adult Children  DME Arranged:  N/A DME Agency:  NA  HH Arranged:  PT, OT HH Agency:  [facility]  Status of Service:  Completed, signed off  Medicare Important Message Given:    Date Medicare IM Given:    Medicare IM give by:    Date Additional Medicare IM Given:    Additional Medicare Important Message give by:     If discussed at [place] of Stay Meetings, dates discussed:    Additional Comments:  Erenest Rasher, RN 08/24/2015, 9:58 AM

## 2015-08-24 NOTE — Telephone Encounter (Signed)
NEw Message   Pt for [facility] calling to get verbal orders for PT- 2x wk for 6 wks. Please call back and discuss.

## 2015-08-25 ENCOUNTER — Encounter: Payer: Self-pay | Admitting: *Deleted

## 2015-08-25 ENCOUNTER — Other Ambulatory Visit: Payer: Self-pay | Admitting: *Deleted

## 2015-08-25 ENCOUNTER — Telehealth: Payer: Self-pay | Admitting: Cardiology

## 2015-08-25 LAB — TYPE AND SCREEN
ABO/RH(D): B POS
ANTIBODY SCREEN: NEGATIVE
UNIT DIVISION: 0
UNIT DIVISION: 0
UNIT DIVISION: 0
Unit division: 0
Unit division: 0

## 2015-08-25 NOTE — Telephone Encounter (Signed)
Ok to order OT for 6 weeks and to give bathing orders.

## 2015-08-25 NOTE — Telephone Encounter (Signed)
New message      Calling to get verbal order to see pt twice a week for 6 weeks for OT.

## 2015-08-25 NOTE — Telephone Encounter (Signed)
Cindy from [place] [facility] is aware that Dr. Mare Ferrari okay to  Give V/O for pt to continue PT 2 times a week for 6 weeks. Also she is aware of MD's recommendations to keep appointment to establish with his PCP  In March.

## 2015-08-25 NOTE — Telephone Encounter (Signed)
Jeremy Jennings is aware that Dr. Mare Ferrari okay the verbal orders on pt for OT and 2 x a week for 6 weeks and to Jeremy Jennings is aware also  Okay the  verbal bathing orders.

## 2015-08-25 NOTE — Telephone Encounter (Signed)
Follow Up  [name] with [facility] called for Home Health for bathing verbal orders.

## 2015-08-25 NOTE — Telephone Encounter (Signed)
Connie @ [facility] calling to get verbal orders to see pt twice a day for 6 weeks for OT.  Verbal orders already given to  [facility] at [place] for PT 2 X a week for 6 weeks. Marlowe Kays states needs separate orders for OT.

## 2015-08-25 NOTE — Telephone Encounter (Signed)
Daughter veronica requestiing an earlier appt for her father, legs painful when standing, no energy, not eating well. Has changed from reg strength tylenol to 500 mg tylenol and it seems to help. Dr Alen Blew is out of the office until the 21st.their appt day.Suggested aleve or ibuprofen. She will try this and keep regularly scheduled appt.

## 2015-08-25 NOTE — Telephone Encounter (Signed)
Left [name] and Marlowe Kays @ [facility] a message to call back.

## 2015-08-25 NOTE — Telephone Encounter (Signed)
Follow up      Pt was discharged from hosp without nursing orders.  Calling to get verbal orders from Dr Mare Ferrari

## 2015-08-25 NOTE — Telephone Encounter (Signed)
Follow up  ° ° °Patient returning call back to nurse  °

## 2015-08-26 ENCOUNTER — Telehealth: Payer: Self-pay | Admitting: Cardiology

## 2015-08-26 NOTE — Telephone Encounter (Signed)
Follow up     Home health calling back to speak with nurse

## 2015-08-27 NOTE — Telephone Encounter (Signed)
Spoke with Wes and requested he remind patient keep scheduled appointment in March to get established with PCP

## 2015-08-29 ENCOUNTER — Other Ambulatory Visit: Payer: Self-pay | Admitting: Oncology

## 2015-08-31 ENCOUNTER — Inpatient Hospital Stay (HOSPITAL_COMMUNITY)
Admission: EM | Admit: 2015-08-31 | Discharge: 2015-09-08 | DRG: 542 | Disposition: A | Discharge status: Skilled Nursing Facility | Payer: Medicare Other | Attending: Internal Medicine | Admitting: Internal Medicine

## 2015-08-31 ENCOUNTER — Emergency Department (HOSPITAL_COMMUNITY): Payer: Medicare Other

## 2015-08-31 ENCOUNTER — Telehealth: Payer: Self-pay | Admitting: *Deleted

## 2015-08-31 ENCOUNTER — Encounter (HOSPITAL_COMMUNITY): Payer: Self-pay | Admitting: Emergency Medicine

## 2015-08-31 ENCOUNTER — Telehealth: Payer: Self-pay

## 2015-08-31 DIAGNOSIS — Z6822 Body mass index (BMI) 22.0-22.9, adult: Secondary | ICD-10-CM

## 2015-08-31 DIAGNOSIS — D6959 Other secondary thrombocytopenia: Secondary | ICD-10-CM | POA: Diagnosis present

## 2015-08-31 DIAGNOSIS — I5032 Chronic diastolic (congestive) heart failure: Secondary | ICD-10-CM | POA: Diagnosis present

## 2015-08-31 DIAGNOSIS — D638 Anemia in other chronic diseases classified elsewhere: Secondary | ICD-10-CM | POA: Diagnosis present

## 2015-08-31 DIAGNOSIS — B952 Enterococcus as the cause of diseases classified elsewhere: Secondary | ICD-10-CM | POA: Diagnosis present

## 2015-08-31 DIAGNOSIS — R52 Pain, unspecified: Secondary | ICD-10-CM

## 2015-08-31 DIAGNOSIS — C7951 Secondary malignant neoplasm of bone: Secondary | ICD-10-CM | POA: Diagnosis present

## 2015-08-31 DIAGNOSIS — D630 Anemia in neoplastic disease: Secondary | ICD-10-CM | POA: Diagnosis present

## 2015-08-31 DIAGNOSIS — Z515 Encounter for palliative care: Secondary | ICD-10-CM | POA: Diagnosis present

## 2015-08-31 DIAGNOSIS — E43 Unspecified severe protein-calorie malnutrition: Secondary | ICD-10-CM | POA: Diagnosis present

## 2015-08-31 DIAGNOSIS — N390 Urinary tract infection, site not specified: Secondary | ICD-10-CM | POA: Diagnosis present

## 2015-08-31 DIAGNOSIS — Z87891 Personal history of nicotine dependence: Secondary | ICD-10-CM

## 2015-08-31 DIAGNOSIS — L8990 Pressure ulcer of unspecified site, unspecified stage: Secondary | ICD-10-CM | POA: Insufficient documentation

## 2015-08-31 DIAGNOSIS — E785 Hyperlipidemia, unspecified: Secondary | ICD-10-CM | POA: Diagnosis present

## 2015-08-31 DIAGNOSIS — Z66 Do not resuscitate: Secondary | ICD-10-CM | POA: Diagnosis present

## 2015-08-31 DIAGNOSIS — I110 Hypertensive heart disease with heart failure: Secondary | ICD-10-CM | POA: Diagnosis present

## 2015-08-31 DIAGNOSIS — M79605 Pain in left leg: Secondary | ICD-10-CM

## 2015-08-31 DIAGNOSIS — E119 Type 2 diabetes mellitus without complications: Secondary | ICD-10-CM | POA: Diagnosis present

## 2015-08-31 DIAGNOSIS — C61 Malignant neoplasm of prostate: Secondary | ICD-10-CM | POA: Diagnosis present

## 2015-08-31 DIAGNOSIS — Z8674 Personal history of sudden cardiac arrest: Secondary | ICD-10-CM

## 2015-08-31 DIAGNOSIS — I10 Essential (primary) hypertension: Secondary | ICD-10-CM | POA: Diagnosis present

## 2015-08-31 DIAGNOSIS — Z794 Long term (current) use of insulin: Secondary | ICD-10-CM

## 2015-08-31 DIAGNOSIS — G893 Neoplasm related pain (acute) (chronic): Secondary | ICD-10-CM | POA: Diagnosis present

## 2015-08-31 DIAGNOSIS — D696 Thrombocytopenia, unspecified: Secondary | ICD-10-CM | POA: Diagnosis present

## 2015-08-31 DIAGNOSIS — I491 Atrial premature depolarization: Secondary | ICD-10-CM | POA: Diagnosis present

## 2015-08-31 DIAGNOSIS — E1169 Type 2 diabetes mellitus with other specified complication: Secondary | ICD-10-CM | POA: Diagnosis present

## 2015-08-31 DIAGNOSIS — R000 Tachycardia, unspecified: Secondary | ICD-10-CM | POA: Diagnosis present

## 2015-08-31 HISTORY — DX: Respiratory disorder, unspecified: J98.9

## 2015-08-31 HISTORY — DX: Cardiac arrest due to other underlying condition: I46.8

## 2015-08-31 LAB — CBC WITH DIFFERENTIAL/PLATELET
Basophils Absolute: 0 10*3/uL (ref 0.0–0.1)
Basophils Relative: 0 %
Eosinophils Absolute: 0 10*3/uL (ref 0.0–0.7)
Eosinophils Relative: 0 %
HCT: 28.3 % — ABNORMAL LOW (ref 39.0–52.0)
Hemoglobin: 8.9 g/dL — ABNORMAL LOW (ref 13.0–17.0)
Lymphocytes Relative: 11 %
Lymphs Abs: 0.7 10*3/uL (ref 0.7–4.0)
MCH: 28.7 pg (ref 26.0–34.0)
MCHC: 31.4 g/dL (ref 30.0–36.0)
MCV: 91.3 fL (ref 78.0–100.0)
Monocytes Absolute: 0.3 10*3/uL (ref 0.1–1.0)
Monocytes Relative: 5 %
Neutro Abs: 5.5 10*3/uL (ref 1.7–7.7)
Neutrophils Relative %: 84 %
Platelets: 102 10*3/uL — ABNORMAL LOW (ref 150–400)
RBC: 3.1 MIL/uL — ABNORMAL LOW (ref 4.22–5.81)
RDW: 18.8 % — ABNORMAL HIGH (ref 11.5–15.5)
WBC: 6.5 10*3/uL (ref 4.0–10.5)

## 2015-08-31 LAB — BASIC METABOLIC PANEL
Anion gap: 11 (ref 5–15)
BUN: 26 mg/dL — ABNORMAL HIGH (ref 6–20)
CO2: 21 mmol/L — ABNORMAL LOW (ref 22–32)
Calcium: 7.7 mg/dL — ABNORMAL LOW (ref 8.9–10.3)
Chloride: 105 mmol/L (ref 101–111)
Creatinine, Ser: 1.16 mg/dL (ref 0.61–1.24)
GFR calc Af Amer: >60 mL/min (ref >60)
GFR calc non Af Amer: 59 mL/min — ABNORMAL LOW (ref >60)
Glucose, Bld: 238 mg/dL — ABNORMAL HIGH (ref 65–99)
Potassium: 4.6 mmol/L (ref 3.5–5.1)
Sodium: 137 mmol/L (ref 135–145)

## 2015-08-31 LAB — MAGNESIUM: MAGNESIUM: 1.8 mg/dL (ref 1.7–2.4)

## 2015-08-31 LAB — TROPONIN I: Troponin I: <0.03 ng/mL (ref <0.031)

## 2015-08-31 LAB — PHOSPHORUS: PHOSPHORUS: 3.3 mg/dL (ref 2.5–4.6)

## 2015-08-31 MED ORDER — HYDROMORPHONE HCL 1 MG/ML IJ SOLN
1.0000 mg | Freq: Once | INTRAMUSCULAR | Status: AC
  Administered 2015-08-31: 1 mg via INTRAVENOUS
  Filled 2015-08-31: qty 1

## 2015-08-31 MED ORDER — OXYCODONE-ACETAMINOPHEN 5-325 MG PO TABS
1.0000 | ORAL_TABLET | Freq: Once | ORAL | Status: AC
Start: 2015-08-31 — End: 2015-08-31
  Administered 2015-08-31: 1 via ORAL
  Filled 2015-08-31: qty 1

## 2015-08-31 MED ORDER — DIAZEPAM 5 MG/ML IJ SOLN
2.5000 mg | Freq: Once | INTRAMUSCULAR | Status: AC
  Administered 2015-08-31: 2.5 mg via INTRAVENOUS
  Filled 2015-08-31: qty 2

## 2015-08-31 MED ORDER — SODIUM CHLORIDE 0.9 % IV BOLUS (SEPSIS)
1000.0000 mL | Freq: Once | INTRAVENOUS | Status: AC
  Administered 2015-08-31: 1000 mL via INTRAVENOUS

## 2015-08-31 MED ORDER — KETOROLAC TROMETHAMINE 15 MG/ML IJ SOLN: 15.0000 mg | Freq: Once | INTRAMUSCULAR | Status: DC

## 2015-08-31 NOTE — Telephone Encounter (Addendum)
Daughter called stating they are in ER. There is no room for the pt at present. She is asking if Dr Alen Blew can do anything. Called back and LVM that Dr Alen Blew is not in [facility] today. Mr Wider is in the best place for him. Sorry he has to wait but ER will attend to him as soon as they can.

## 2015-08-31 NOTE — ED Provider Notes (Signed)
CSN: [id number]     Arrival date & time 08/31/15  1208 History   First MD Initiated Contact with Patient 08/31/15 1838     Chief Complaint  Patient presents with  . Leg Pain     (Consider location/radiation/quality/duration/timing/severity/associated sxs/prior Treatment) HPI    Jeremy Jennings is a 80 y.o. male with a past medical history of prostate cancer, type 2 diabetes, hypertension, chronic diastolic congestive heart failure, hospitalization in December for small bowel obstruction requiring surgery. That hospitalization was complicated by cardiac arrest. Patient subsequently hospitalized in December 2016 with sepsis due to C. difficile colitis. He was discharged to skilled nursing facility. He was hospitalized at [facility] in January for anemia and was transfused 2 units of blood. He was discharged from the skilled nursing facility back home. Patient had a follow-up appointment with his cardiologist and blood work was done which revealed low blood counts. He was admitted to the hospital approximately 2 weeks ago with worsening anemia again. He was transfused 4 units of packed red blood cells. Extensive GI workup recently and GI did not feel that there would be much additional benefit to repeating these. He was discharged back home. He was initially doing fairly well after discharge but then began having progressive pain in his left hip and left thigh. Denies any acute trauma. Pain is progress to this point that he could not bear weight on it. He had some lower extremity edema throughout his hospitalizations but family reports that it has improved since then. He is on Lasix. Denies any past history of DVT/PE.  Past Medical History  Diagnosis Date  . Essential hypertension   . Type 2 diabetes mellitus ([place])   . Hyperlipidemia   . Prostate cancer ([place])     Stage 4  . History of GI bleed   . Sinus tachycardia (HCC)     Chronic  . PAC (premature atrial contraction)      Frequent  . PVC's (premature ventricular contractions)   . Chronic diastolic CHF (congestive heart failure) ([place])   . Hypertensive heart disease   . Protein calorie malnutrition ([place])   . Anemia   . SBO (small bowel obstruction) ([place])     a. 06/2015: prolonged admissions for SBO s/p exlap/LOA c/b post-op ileus, fever, enterococcal UTI, a/c dCHF, acute encephalopathy, anemia, and readmission for septic shock due to C diff colitis.  Marland Kitchen PEA (Pulseless electrical activity) ([place])     a. 06/2015: in setting of acute respiratory failure, felt to be primary resp event.  . C. difficile colitis     a. 06/2015: a/w septic shock.  . Hypoalbuminemia    Past Surgical History  Procedure Laterality Date  . Tonsillectomy    . Hemorroidectomy  1969  . Esophagogastroduodenoscopy N/A 05/28/2015    Procedure: ESOPHAGOGASTRODUODENOSCOPY (EGD);  Surgeon: [name], MD;  Location: [name];  Service: Endoscopy;  Laterality: N/A;  . Inguinal hernia repair Bilateral AFE 16-17  . Hernia repair  1981    [facility] [place]  . Laparotomy N/A 06/12/2015    Procedure: EXPLORATORY LAPAROTOMY;  Surgeon: [name], MD;  Location: [facility];  Service: General;  Laterality: N/A;  . Bowel resection N/A 06/12/2015    Procedure: SMALL BOWEL RESECTION;  Surgeon: [name], MD;  Location: [facility];  Service: General;  Laterality: N/A;   Family History  Problem Relation Age of Onset  . Cancer Neg Hx   . Heart attack Neg Hx     unknown  .  Stroke Neg Hx     unknown  . Kidney failure Brother   . Kidney failure Sister   . Diabetes Mother   . Hypertension Father    Social History  Substance Use Topics  . Smoking status: Former Smoker -- 0.25 packs/day for 20 years    Types: Cigarettes    Quit date: 07/12/1979  . Smokeless tobacco: Never Used  . Alcohol Use: No    Review of Systems  All systems reviewed and negative, other than as noted in HPI.   Allergies  Review of patient's allergies indicates no  known allergies.  Home Medications   Prior to Admission medications   Medication Sig Start Date End Date Taking? Authorizing Provider  acetaminophen (TYLENOL) 325 MG tablet Take 650 mg by mouth every 6 (six) hours as needed for mild pain or moderate pain.    Historical Provider, MD  atorvastatin (LIPITOR) 10 MG tablet Take 10 mg by mouth daily.    Historical Provider, MD  Cholecalciferol (VITAMIN D3) 5000 units TABS Take 5,000 Units by mouth daily.    Historical Provider, MD  COMBIGAN 0.2-0.5 % ophthalmic solution Place 1 drop into the right eye daily.     Historical Provider, MD  feeding supplement, ENSURE ENLIVE, (ENSURE ENLIVE) LIQD Take 237 mLs by mouth 2 (two) times daily between meals. 07/17/15   Modena Jansky, MD  folic acid (FOLVITE) 1 MG tablet Take 1 mg by mouth daily.    Historical Provider, MD  furosemide (LASIX) 40 MG tablet Take 40 mg by mouth daily.     Historical Provider, MD  insulin aspart (NOVOLOG) 100 UNIT/ML injection Inject 0-9 Units into the skin 3 (three) times daily with meals as needed for high blood sugar. Pt uses per sliding scale:    70-150:  0 units  151-200:  1 unit  201-250:  2 units  251-300:  3 units  301-350:  5 units  351-400:  7 units  401-500:  9 units and notify MD    Historical Provider, MD  insulin detemir (LEVEMIR) 100 UNIT/ML injection Inject 6 Units into the skin at bedtime.    Historical Provider, MD  megestrol (MEGACE) 400 MG/10ML suspension Take 10 mLs (400 mg total) by mouth 2 (two) times daily. 05/18/15   Wyatt Portela, MD  metoprolol (LOPRESSOR) 50 MG tablet Take 1 tablet (50 mg total) by mouth 2 (two) times daily. 07/27/15   Dayna N Dunn, PA-C  ondansetron (ZOFRAN) 4 MG tablet Take 4 mg by mouth every 8 (eight) hours as needed for nausea or vomiting. As directed 07/03/15   Historical Provider, MD  prednisoLONE acetate (PRED FORTE) 1 % ophthalmic suspension Place 1 drop into both eyes 2 (two) times daily.  04/25/13   Historical Provider, MD   saccharomyces boulardii (FLORASTOR) 250 MG capsule Take 1 capsule (250 mg total) by mouth 2 (two) times daily. 07/17/15   Modena Jansky, MD  tamsulosin (FLOMAX) 0.4 MG CAPS capsule Take 0.4 mg by mouth daily.     Historical Provider, MD  vitamin B-12 (CYANOCOBALAMIN) 1000 MCG tablet Take 1,000 mcg by mouth daily.    Historical Provider, MD   BP 121/61 mmHg  Pulse 97  Temp(Src) 97.7 F (36.5 C) (Oral)  Resp 16  SpO2 100% Physical Exam  Constitutional: He appears well-developed and well-nourished. No distress.  Laying in bed. Tired/frail appearing but not distressed.   HENT:  Head: Normocephalic and atraumatic.  Eyes: Conjunctivae are normal. Right eye exhibits no  discharge. Left eye exhibits no discharge.  Neck: Neck supple.  Cardiovascular: Normal rate, regular rhythm and normal heart sounds.  Exam reveals no gallop and no friction rub.   No murmur heard. Pulmonary/Chest: Effort normal and breath sounds normal. No respiratory distress.  Abdominal: Soft. He exhibits no distension. There is no tenderness.  Musculoskeletal: He exhibits edema. He exhibits no tenderness.  Very mild edema lower extremities, left worse than right.Lower extremities are relatively normal in appearance otherwise. Tenderness along the left thigh/femur.  No concerning lesions noted. Patient has exquisite pain with passive and active range of motion left hip. NVI distally.   Neurological: He is alert.  Skin: Skin is warm and dry.  Psychiatric: He has a normal mood and affect. His behavior is normal. Thought content normal.  Nursing note and vitals reviewed.   ED Course  Procedures (including critical care time) Labs Review Labs Reviewed  CBC WITH DIFFERENTIAL/PLATELET - Abnormal; Notable for the following:    RBC 3.10 (*)    Hemoglobin 8.9 (*)    HCT 28.3 (*)    RDW 18.8 (*)    Platelets 102 (*)    All other components within normal limits  BASIC METABOLIC PANEL - Abnormal; Notable for the following:     CO2 21 (*)    Glucose, Bld 238 (*)    BUN 26 (*)    Calcium 7.7 (*)    GFR calc non Af Amer 59 (*)    All other components within normal limits  TROPONIN I  URINALYSIS, ROUTINE W REFLEX MICROSCOPIC (NOT AT [facility])  MAGNESIUM  PHOSPHORUS    Imaging Review Dg Hip Unilat With Pelvis 2-3 Views Left  08/31/2015  CLINICAL DATA:  80 year old with current history of metastatic prostate cancer, presenting with acute onset left hip pain. No recent injuries. EXAM: DG HIP (WITH OR WITHOUT PELVIS) 2-3V LEFT COMPARISON:  None. FINDINGS: No evidence of acute or subacute fracture or dislocation. Sclerotic osseous metastatic disease involving much of the left hemipelvis, including the entire left acetabulum. Mild to moderate axial joint space narrowing. Sclerotic osseous metastases elsewhere in the right side of the pelvis, the sacrum, the proximal femora, and the visualized lower lumbar spine. Sacroiliac joints and symphysis pubis intact. Symmetric mild to moderate axial joint space narrowing in the contralateral right hip. IMPRESSION: 1. No acute osseous abnormality. 2. Sclerotic osseous metastatic disease throughout the left hemipelvis, including the entire left acetabulum. 3. Sclerotic osseous metastatic disease elsewhere throughout the right hemipelvis, sacrum, lower lumbar spine and bilateral proximal femora. 4. 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 Electronically Signed   By: Evangeline Dakin M.D.   On: 08/31/2015 19:57   I have personally reviewed and evaluated these images and lab results as part of my medical decision-making.   EKG Interpretation   Date/Time:  Monday August 31 2015 19:50:13 EST Ventricular Rate:  109 PR Interval:  147 QRS Duration: 71 QT Interval:  344 QTC Calculation: 463 R Axis:   19 Text Interpretation:  Sinus tachycardia Atrial premature complexes  Probable left atrial enlargement Confirmed by Wilson Singer  MD, Lyan Holck ([id number]) on  08/31/2015 10:53:37 PM       MDM   Final diagnoses:  Bony metastasis (HCC)  Left leg pain   80year-old male with left hip and thigh pain. Unfortunately, imaging showing multiple metastatic lesions in this pelvis/hip, lower back and proximal femur. Unable to adequately treat pain to the point that patient can ambulate. Will admit for pain control.  Virgel Manifold, MD 09/03/15 [phone]

## 2015-08-31 NOTE — ED Notes (Signed)
Pt moved by this Charge RN back to the [facility].  Pt provided a mask and pillow for foot.  This Agricultural consultant received a call from [place] regarding Pt and confirmed that the Pt is not receiving CA treatment.

## 2015-08-31 NOTE — Telephone Encounter (Signed)
Pt has L leg pain, has not been out of bed for 3 days, is using a urinal. The weekend call people said to use aleve and that is not helping. Pt had been using walker before this. There is PT coming to the house but he cannot have them work on him. Pain started Thursday or Friday it moved from pelvic area and goes all the way down to the L knee. Feels like someone tightening a belt around his leg. No swelling, no redness, not heat. Ice pack did not help, heat does not help. He is asking "to be put out" of his pain. Please call pt back on cell [phone]

## 2015-08-31 NOTE — ED Notes (Signed)
Tried to ambulate pt, pt unable to ambulate

## 2015-08-31 NOTE — Telephone Encounter (Signed)
Instructed Jeremy Jennings to call 911 for the patient to have his left leg evaluated due to intolerable pain. Jeremy Jennings verbalized understanding.

## 2015-08-31 NOTE — Telephone Encounter (Signed)
He needs an urgent evaluation in the ED.

## 2015-08-31 NOTE — H&P (Signed)
[facility] History and Physical  Jeremy Jennings [id number] DOB: 04/28/1936 DOA: 08/31/2015  Referring physician: Virgel Manifold, MD PCP: [name], MD   Chief Complaint: Left leg pain.  HPI: Jeremy Jennings is a 80 y.o. male with a past medical history of metastatic to bone prostate cancer, hypertension, diastolic dysfunction (last EF was 70% 2 months ago post cardiac arrest), type 2 diabetes, hyperlipidemia with multiple hospitalizations in the last 3 months, see below:  Acute GI bleeding on 05/27/2015 likely from NSAIDs use. Small bowel obstruction requiring surgery and complicated by brief cardiac arrest on 06/11/2015. Cdiff colitis septic shock on 07/05/2015. Symptomatic anemia requiring blood transfusion on 07/28/2015. Symptomatic anemia requiring blood transfusion on 08/21/2015.  And now is returning to the hospital with progressively worse left hip, lower back pain radiating to his left lower extremities. Work up in the ED shows multiple sclerotic osseus metastases in the left hemipelvis. He received Toradol, percocet and dilaudid earlier and states that he currently has no pain, unless he tries to move.    He denies fever, headache, chest pain, nausea, emesis, flank pain, diarrhea, melena or hematochezia dysuria or frequency. He complains of fatigue, chills, weight loss, decreased appetite, mild dyspnea, occasional cough and constipation.  Review of Systems:  Constitutional:  Positive Fatigue,  weight loss, chills Denies night sweats, Fevers.  HEENT:  No headaches, Difficulty swallowing,Tooth/dental problems,Sore throat,  No sneezing, itching, ear ache, nasal congestion, post nasal drip,  Cardio-vascular:  Positive for dizziness, palpitations, occasional swelling in lower extremities. No chest pain, Orthopnea, PND,  Anasarca. GI:  Positive for change in bowel habits, loss of appetite.  No heartburn, indigestion, abdominal pain, nausea, vomiting,  diarrhea. Resp:  Occasional mild dyspnea, occasional productive cough, no wheezing, no hemoptysis. Skin:  No rash or lesions.  GU:  Decreased urination. no dysuria, change in color of urine, no urgency or frequency. No flank pain.  Musculoskeletal:  Generalized weakness, left hip and left lower back pain radiating down to his left lower extremity.   Past Medical History  Diagnosis Date  . Essential hypertension   . Type 2 diabetes mellitus ([place])   . Hyperlipidemia   . Prostate cancer ([place])     Stage 4  . History of GI bleed   . Sinus tachycardia (HCC)     Chronic  . PAC (premature atrial contraction)     Frequent  . PVC's (premature ventricular contractions)   . Chronic diastolic CHF (congestive heart failure) ([place])   . Hypertensive heart disease   . Protein calorie malnutrition ([place])   . Anemia   . SBO (small bowel obstruction) ([place])     a. 06/2015: prolonged admissions for SBO s/p exlap/LOA c/b post-op ileus, fever, enterococcal UTI, a/c dCHF, acute encephalopathy, anemia, and readmission for septic shock due to C diff colitis.  Marland Kitchen PEA (Pulseless electrical activity) ([place])     a. 06/2015: in setting of acute respiratory failure, felt to be primary resp event.  . C. difficile colitis     a. 06/2015: a/w septic shock.  . Hypoalbuminemia    Past Surgical History  Procedure Laterality Date  . Tonsillectomy    . Hemorroidectomy  1969  . Esophagogastroduodenoscopy N/A 05/28/2015    Procedure: ESOPHAGOGASTRODUODENOSCOPY (EGD);  Surgeon: [name], MD;  Location: [name];  Service: Endoscopy;  Laterality: N/A;  . Inguinal hernia repair Bilateral AFE 16-17  . Hernia repair  1981    [facility] [place]  . Laparotomy N/A 06/12/2015  Procedure: EXPLORATORY LAPAROTOMY;  Surgeon: [name], MD;  Location: [facility];  Service: General;  Laterality: N/A;  . Bowel resection N/A 06/12/2015    Procedure: SMALL BOWEL RESECTION;  Surgeon: [name], MD;  Location: [facility];   Service: General;  Laterality: N/A;   Social History:  reports that he quit smoking about 36 years ago. His smoking use included Cigarettes. He has a 5 pack-year smoking history. He has never used smokeless tobacco. He reports that he does not drink alcohol or use illicit drugs.  No Known Allergies  Family History  Problem Relation Age of Onset  . Cancer Neg Hx   . Heart attack Neg Hx     unknown  . Stroke Neg Hx     unknown  . Kidney failure Brother   . Kidney failure Sister   . Diabetes Mother   . Hypertension Father     Prior to Admission medications   Medication Sig Start Date End Date Taking? Authorizing Provider  acetaminophen (TYLENOL) 325 MG tablet Take 650 mg by mouth every 6 (six) hours as needed for mild pain or moderate pain.   Yes Historical Provider, MD  atorvastatin (LIPITOR) 10 MG tablet Take 10 mg by mouth daily.   Yes Historical Provider, MD  Cholecalciferol (VITAMIN D3) 5000 units TABS Take 5,000 Units by mouth daily.   Yes Historical Provider, MD  COMBIGAN 0.2-0.5 % ophthalmic solution Place 1 drop into the right eye daily.    Yes Historical Provider, MD  feeding supplement, ENSURE ENLIVE, (ENSURE ENLIVE) LIQD Take 237 mLs by mouth 2 (two) times daily between meals. 07/17/15  Yes Modena Jansky, MD  folic acid (FOLVITE) 1 MG tablet Take 1 mg by mouth daily.   Yes Historical Provider, MD  furosemide (LASIX) 40 MG tablet Take 40 mg by mouth daily.    Yes Historical Provider, MD  insulin aspart (NOVOLOG) 100 UNIT/ML injection Inject 0-9 Units into the skin 3 (three) times daily with meals as needed for high blood sugar. Pt uses per sliding scale:    70-150:  0 units  151-200:  1 unit  201-250:  2 units  251-300:  3 units  301-350:  5 units  351-400:  7 units  401-500:  9 units and notify MD   Yes Historical Provider, MD  insulin detemir (LEVEMIR) 100 UNIT/ML injection Inject 6 Units into the skin at bedtime.   Yes Historical Provider, MD  megestrol (MEGACE) 20  MG tablet Take 20 mg by mouth daily.   Yes Historical Provider, MD  megestrol (MEGACE) 400 MG/10ML suspension Take 10 mLs (400 mg total) by mouth 2 (two) times daily. 05/18/15  Yes Wyatt Portela, MD  metoprolol (LOPRESSOR) 50 MG tablet Take 1 tablet (50 mg total) by mouth 2 (two) times daily. 07/27/15  Yes Dayna N Dunn, PA-C  ondansetron (ZOFRAN) 4 MG tablet Take 4 mg by mouth every 8 (eight) hours as needed for nausea or vomiting. As directed 07/03/15  Yes Historical Provider, MD  prednisoLONE acetate (PRED FORTE) 1 % ophthalmic suspension Place 1 drop into both eyes 2 (two) times daily.  04/25/13  Yes Historical Provider, MD  saccharomyces boulardii (FLORASTOR) 250 MG capsule Take 1 capsule (250 mg total) by mouth 2 (two) times daily. 07/17/15  Yes Modena Jansky, MD  tamsulosin (FLOMAX) 0.4 MG CAPS capsule Take 0.4 mg by mouth daily.    Yes Historical Provider, MD  vitamin B-12 (CYANOCOBALAMIN) 1000 MCG tablet Take 1,000 mcg by  mouth daily.   Yes Historical Provider, MD   Physical Exam: Filed Vitals:   08/31/15 1343 08/31/15 2030 08/31/15 2100 08/31/15 2130  BP: 121/61 134/122 121/58 97/59  Pulse: 97 114 112   Temp: 97.7 F (36.5 C)     TempSrc: Oral     Resp: 16 17 12 17   SpO2: 100% 100% 100%     Wt Readings from Last 3 Encounters:  08/23/15 72.303 kg (159 lb 6.4 oz)  08/20/15 74.844 kg (165 lb)  07/28/15 97.977 kg (216 lb)    General:  Appears calm and comfortable Eyes: PERRL, normal lids, irises. Pale conjunctiva ENT: grossly normal hearing, lips & tongue. Oral mucosa is mildly dry. Neck: No LAD, masses or thyromegaly Cardiovascular: Tachycardic, no m/r/g. 1+ Left LE edema. Telemetry: Sinus tachycardia. Respiratory: CTA bilaterally, no w/r/r.  Abdomen: Positive surgical scar, BS +, soft, ntnd Skin: no rash or induration seen on limited exam Musculoskeletal: grossly normal tone BUE/BLE Psychiatric: Mildly sedated from medication, but speech fluent and appropriate Neurologic:  Awake, alert, oriented 3, unable to fully evaluate due to pain triggered by                                motion.           Labs on Admission:  Basic Metabolic Panel:  Recent Labs Lab 08/31/15 1952  NA 137  K 4.6  CL 105  CO2 21*  GLUCOSE 238*  BUN 26*  CREATININE 1.16  CALCIUM 7.7*   CBC:  Recent Labs Lab 08/31/15 1952  WBC 6.5  NEUTROABS 5.5  HGB 8.9*  HCT 28.3*  MCV 91.3  PLT 102*   Cardiac Enzymes:  Recent Labs Lab 08/31/15 1952  TROPONINI <0.03    BNP (last 3 results)  Recent Labs  06/19/15 1120 06/23/15 0530 07/05/15 0955  BNP 258.8* 147.8* 348.0*    Radiological Exams on Admission: Dg Hip Unilat With Pelvis 2-3 Views Left  08/31/2015  CLINICAL DATA:  80 year old with current history of metastatic prostate cancer, presenting with acute onset left hip pain. No recent injuries. EXAM: DG HIP (WITH OR WITHOUT PELVIS) 2-3V LEFT COMPARISON:  None. FINDINGS: No evidence of acute or subacute fracture or dislocation. Sclerotic osseous metastatic disease involving much of the left hemipelvis, including the entire left acetabulum. Mild to moderate axial joint space narrowing. Sclerotic osseous metastases elsewhere in the right side of the pelvis, the sacrum, the proximal femora, and the visualized lower lumbar spine. Sacroiliac joints and symphysis pubis intact. Symmetric mild to moderate axial joint space narrowing in the contralateral right hip. IMPRESSION: 1. No acute osseous abnormality. 2. Sclerotic osseous metastatic disease throughout the left hemipelvis, including the entire left acetabulum. 3. Sclerotic osseous metastatic disease elsewhere throughout the right hemipelvis, sacrum, lower lumbar spine and bilateral proximal femora. 4. 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 Electronically Signed   By: Evangeline Dakin M.D.   On: 08/31/2015 19:57  Echocardiogram on  06/29/2015  ------------------------------------------------------------------- LV EF: 65% -  70%  ------------------------------------------------------------------- Indications:   CHF - 428.0.  ------------------------------------------------------------------- History:  PMH: Cancer. PEA Arrest, Risk factors: Hypertension. Diabetes mellitus. Dyslipidemia.  ------------------------------------------------------------------- Study Conclusions  - Left ventricle: Hyperdynamic LV function but less cavity obliteration than previous and no LVOT gradient The cavity size was normal. Wall thickness was increased in a pattern of moderate LVH. Systolic function was vigorous. The estimated ejection fraction was in the range of  65% to 70%. Wall motion was normal; there were no regional wall motion abnormalities. Doppler parameters are consistent with elevated ventricular end-diastolic filling pressure. - Left atrium: The atrium was mildly dilated. - Atrial septum: No defect or patent foramen ovale was identified   EKG: Independently reviewed Vent. rate 109 BPM PR interval 147 ms QRS duration 71 ms QT/QTc 344/463 ms P-R-T axes 65 19 64 Sinus tachycardia Atrial premature complexes Probable left atrial enlargement  Assessment/Plan Principal Problem:   Intractable pain   Metastatic Prostate cancer ([place]) Admit to MedSurg. Continue analgesics as needed. The patient is not currently getting chemotherapy. Consider palliative care consult in the morning.  Active Problems:     Symptomatic anemia Hemoglobin has decreased from 10.2 to 8.9 g/dL since last transfusion. Monitor H&H and transfuse if necessary.    Hyperlipidemia Continue atorvastatin 10 mg by mouth daily. Consider discontinuing this medication if patient agrees, given advanced age of prostate CA.    Essential hypertension Continue metoprolol 50 mg by mouth twice a day. Hold furosemide for the  moment.    PAC (premature atrial contraction)   Sinus tachycardia (HCC) The patient has not taking his metoprolol today. Resume metoprolol after IV hydration. Check magnesium level.      Type 2 diabetes mellitus (HCC) CBG monitoring with regular insulin sliding scale.    Code Status: Full code. The patient was DO NOT RESUSCITATE in November. DVT Prophylaxis: Thrombocytopenic and awaiting lower extremity duplex. Family Communication:  Disposition Plan: Admit for pain control. Evaluation for DVT.  Time spent: Over 70 minutes were spent in the process of this admission.  Reubin Milan, M.D. [facility] Pager [phone].

## 2015-08-31 NOTE — ED Notes (Addendum)
Pt went to X-ray.   

## 2015-08-31 NOTE — ED Notes (Signed)
Per [facility] pt complaint of left leg pain for 3 days; denies injury.

## 2015-08-31 NOTE — ED Notes (Signed)
Pt stood up but wouldn't  take a step forward.

## 2015-08-31 NOTE — Telephone Encounter (Signed)
This RN called ED charge and yes the ED is backed up about 5 hours. The pt has a percocet ordered for him and a nurse will soon be administering it. Called [name] back and explained that a pain pill will be administered soon - give it 20-40 minutes to work. Jeremy Jennings is considering taking her dad home rather than him sitting in a wheelchair with his leg propped up with pillows and sheets. Tried to explain that even if we saw pt at [facility] we would probably be sending him to ER for further workup so it is best to stay there if possible.

## 2015-09-01 ENCOUNTER — Encounter (HOSPITAL_COMMUNITY): Payer: Self-pay | Admitting: Physician Assistant

## 2015-09-01 ENCOUNTER — Other Ambulatory Visit: Payer: Medicare Other

## 2015-09-01 ENCOUNTER — Ambulatory Visit: Payer: Medicare Other | Admitting: Oncology

## 2015-09-01 ENCOUNTER — Telehealth: Payer: Self-pay | Admitting: *Deleted

## 2015-09-01 ENCOUNTER — Observation Stay (HOSPITAL_COMMUNITY): Payer: Medicare Other

## 2015-09-01 ENCOUNTER — Telehealth: Payer: Self-pay

## 2015-09-01 DIAGNOSIS — Z87891 Personal history of nicotine dependence: Secondary | ICD-10-CM | POA: Diagnosis not present

## 2015-09-01 DIAGNOSIS — C61 Malignant neoplasm of prostate: Secondary | ICD-10-CM | POA: Diagnosis not present

## 2015-09-01 DIAGNOSIS — D6959 Other secondary thrombocytopenia: Secondary | ICD-10-CM | POA: Diagnosis present

## 2015-09-01 DIAGNOSIS — Z515 Encounter for palliative care: Secondary | ICD-10-CM | POA: Diagnosis not present

## 2015-09-01 DIAGNOSIS — R000 Tachycardia, unspecified: Secondary | ICD-10-CM

## 2015-09-01 DIAGNOSIS — I10 Essential (primary) hypertension: Secondary | ICD-10-CM

## 2015-09-01 DIAGNOSIS — I5032 Chronic diastolic (congestive) heart failure: Secondary | ICD-10-CM | POA: Diagnosis present

## 2015-09-01 DIAGNOSIS — D638 Anemia in other chronic diseases classified elsewhere: Secondary | ICD-10-CM | POA: Diagnosis not present

## 2015-09-01 DIAGNOSIS — R52 Pain, unspecified: Secondary | ICD-10-CM

## 2015-09-01 DIAGNOSIS — M7989 Other specified soft tissue disorders: Secondary | ICD-10-CM | POA: Diagnosis not present

## 2015-09-01 DIAGNOSIS — G893 Neoplasm related pain (acute) (chronic): Secondary | ICD-10-CM | POA: Diagnosis not present

## 2015-09-01 DIAGNOSIS — Z6822 Body mass index (BMI) 22.0-22.9, adult: Secondary | ICD-10-CM | POA: Diagnosis not present

## 2015-09-01 DIAGNOSIS — M79609 Pain in unspecified limb: Secondary | ICD-10-CM | POA: Diagnosis not present

## 2015-09-01 DIAGNOSIS — I491 Atrial premature depolarization: Secondary | ICD-10-CM | POA: Diagnosis present

## 2015-09-01 DIAGNOSIS — D630 Anemia in neoplastic disease: Secondary | ICD-10-CM | POA: Diagnosis present

## 2015-09-01 DIAGNOSIS — N390 Urinary tract infection, site not specified: Secondary | ICD-10-CM | POA: Diagnosis present

## 2015-09-01 DIAGNOSIS — E119 Type 2 diabetes mellitus without complications: Secondary | ICD-10-CM | POA: Diagnosis present

## 2015-09-01 DIAGNOSIS — E785 Hyperlipidemia, unspecified: Secondary | ICD-10-CM | POA: Diagnosis not present

## 2015-09-01 DIAGNOSIS — Z8674 Personal history of sudden cardiac arrest: Secondary | ICD-10-CM | POA: Diagnosis not present

## 2015-09-01 DIAGNOSIS — C7951 Secondary malignant neoplasm of bone: Secondary | ICD-10-CM | POA: Diagnosis present

## 2015-09-01 DIAGNOSIS — Z794 Long term (current) use of insulin: Secondary | ICD-10-CM | POA: Diagnosis not present

## 2015-09-01 DIAGNOSIS — Z66 Do not resuscitate: Secondary | ICD-10-CM | POA: Diagnosis present

## 2015-09-01 DIAGNOSIS — M79605 Pain in left leg: Secondary | ICD-10-CM | POA: Diagnosis present

## 2015-09-01 DIAGNOSIS — I110 Hypertensive heart disease with heart failure: Secondary | ICD-10-CM | POA: Diagnosis present

## 2015-09-01 DIAGNOSIS — B952 Enterococcus as the cause of diseases classified elsewhere: Secondary | ICD-10-CM | POA: Diagnosis present

## 2015-09-01 DIAGNOSIS — E43 Unspecified severe protein-calorie malnutrition: Secondary | ICD-10-CM | POA: Diagnosis present

## 2015-09-01 LAB — URINALYSIS, ROUTINE W REFLEX MICROSCOPIC
GLUCOSE, UA: NEGATIVE mg/dL
KETONES UR: NEGATIVE mg/dL
NITRITE: NEGATIVE
PH: 5 (ref 5.0–8.0)
Protein, ur: 30 mg/dL — AB
SPECIFIC GRAVITY, URINE: 1.018 (ref 1.005–1.030)

## 2015-09-01 LAB — COMPREHENSIVE METABOLIC PANEL
ALT: 13 U/L — AB (ref 17–63)
ANION GAP: 9 (ref 5–15)
AST: 16 U/L (ref 15–41)
Albumin: 1.5 g/dL — ABNORMAL LOW (ref 3.5–5.0)
Alkaline Phosphatase: 314 U/L — ABNORMAL HIGH (ref 38–126)
BUN: 26 mg/dL — ABNORMAL HIGH (ref 6–20)
CHLORIDE: 107 mmol/L (ref 101–111)
CO2: 21 mmol/L — AB (ref 22–32)
CREATININE: 1.13 mg/dL (ref 0.61–1.24)
Calcium: 7.4 mg/dL — ABNORMAL LOW (ref 8.9–10.3)
Glucose, Bld: 186 mg/dL — ABNORMAL HIGH (ref 65–99)
Potassium: 4.6 mmol/L (ref 3.5–5.1)
Sodium: 137 mmol/L (ref 135–145)
Total Bilirubin: 0.8 mg/dL (ref 0.3–1.2)
Total Protein: 5.9 g/dL — ABNORMAL LOW (ref 6.5–8.1)

## 2015-09-01 LAB — CBC WITH DIFFERENTIAL/PLATELET
Basophils Absolute: 0 10*3/uL (ref 0.0–0.1)
Basophils Relative: 0 %
EOS ABS: 0 10*3/uL (ref 0.0–0.7)
Eosinophils Relative: 0 %
HEMATOCRIT: 25.9 % — AB (ref 39.0–52.0)
HEMOGLOBIN: 8.2 g/dL — AB (ref 13.0–17.0)
LYMPHS ABS: 0.8 10*3/uL (ref 0.7–4.0)
Lymphocytes Relative: 11 %
MCH: 28.8 pg (ref 26.0–34.0)
MCHC: 31.7 g/dL (ref 30.0–36.0)
MCV: 90.9 fL (ref 78.0–100.0)
MONO ABS: 0.4 10*3/uL (ref 0.1–1.0)
MONOS PCT: 6 %
NEUTROS ABS: 5.8 10*3/uL (ref 1.7–7.7)
NEUTROS PCT: 82 %
Platelets: 110 10*3/uL — ABNORMAL LOW (ref 150–400)
RBC: 2.85 MIL/uL — ABNORMAL LOW (ref 4.22–5.81)
RDW: 18.6 % — ABNORMAL HIGH (ref 11.5–15.5)
WBC: 7 10*3/uL (ref 4.0–10.5)

## 2015-09-01 LAB — MRSA PCR SCREENING: MRSA by PCR: NEGATIVE

## 2015-09-01 LAB — URINE MICROSCOPIC-ADD ON

## 2015-09-01 LAB — CBG MONITORING, ED
Glucose-Capillary: 169 mg/dL — ABNORMAL HIGH (ref 65–99)
Glucose-Capillary: 149 mg/dL — ABNORMAL HIGH (ref 65–99)
Glucose-Capillary: 153 mg/dL — ABNORMAL HIGH (ref 65–99)

## 2015-09-01 MED ORDER — HYDROMORPHONE HCL 1 MG/ML IJ SOLN
1.0000 mg | INTRAMUSCULAR | Status: DC | PRN
  Administered 2015-09-01 – 2015-09-07 (×27): 1 mg via INTRAVENOUS
  Filled 2015-09-01 (×28): qty 1

## 2015-09-01 MED ORDER — ACETAMINOPHEN 325 MG PO TABS
650.0000 mg | ORAL_TABLET | Freq: Four times a day (QID) | ORAL | Status: DC | PRN
  Administered 2015-09-02: 650 mg via ORAL
  Filled 2015-09-01: qty 2

## 2015-09-01 MED ORDER — SACCHAROMYCES BOULARDII 250 MG PO CAPS
250.0000 mg | ORAL_CAPSULE | Freq: Two times a day (BID) | ORAL | Status: DC
Start: 2015-09-01 — End: 2015-09-09
  Administered 2015-09-01 – 2015-09-08 (×15): 250 mg via ORAL
  Filled 2015-09-01 (×16): qty 1

## 2015-09-01 MED ORDER — TIMOLOL MALEATE 0.5 % OP SOLN
1.0000 [drp] | Freq: Every day | OPHTHALMIC | Status: DC
  Administered 2015-09-01 – 2015-09-08 (×8): 1 [drp] via OPHTHALMIC
  Filled 2015-09-01: qty 5

## 2015-09-01 MED ORDER — TAMSULOSIN HCL 0.4 MG PO CAPS
0.4000 mg | ORAL_CAPSULE | Freq: Every day | ORAL | Status: DC
  Administered 2015-09-02 – 2015-09-08 (×7): 0.4 mg via ORAL
  Filled 2015-09-01 (×7): qty 1

## 2015-09-01 MED ORDER — PREDNISOLONE ACETATE 1 % OP SUSP
1.0000 [drp] | Freq: Two times a day (BID) | OPHTHALMIC | Status: DC
  Administered 2015-09-01 – 2015-09-08 (×15): 1 [drp] via OPHTHALMIC
  Filled 2015-09-01: qty 1

## 2015-09-01 MED ORDER — LABETALOL HCL 5 MG/ML IV SOLN
0.5000 mg/min | INTRAVENOUS | Status: DC
  Filled 2015-09-01: qty 100

## 2015-09-01 MED ORDER — INSULIN ASPART 100 UNIT/ML ~~LOC~~ SOLN
0.0000 [IU] | Freq: Three times a day (TID) | SUBCUTANEOUS | Status: DC
  Administered 2015-09-02: 5 [IU] via SUBCUTANEOUS
  Administered 2015-09-02: 3 [IU] via SUBCUTANEOUS
  Administered 2015-09-02: 5 [IU] via SUBCUTANEOUS
  Administered 2015-09-03: 11 [IU] via SUBCUTANEOUS
  Administered 2015-09-03: 8 [IU] via SUBCUTANEOUS
  Administered 2015-09-03: 3 [IU] via SUBCUTANEOUS
  Administered 2015-09-04: 5 [IU] via SUBCUTANEOUS
  Administered 2015-09-04: 2 [IU] via SUBCUTANEOUS
  Administered 2015-09-04 – 2015-09-05 (×2): 5 [IU] via SUBCUTANEOUS
  Administered 2015-09-05: 3 [IU] via SUBCUTANEOUS
  Administered 2015-09-05 – 2015-09-06 (×3): 5 [IU] via SUBCUTANEOUS
  Administered 2015-09-06: 8 [IU] via SUBCUTANEOUS
  Administered 2015-09-07 (×2): 5 [IU] via SUBCUTANEOUS
  Administered 2015-09-07: 11 [IU] via SUBCUTANEOUS
  Administered 2015-09-08: 8 [IU] via SUBCUTANEOUS
  Administered 2015-09-08: 2 [IU] via SUBCUTANEOUS
  Administered 2015-09-08: 3 [IU] via SUBCUTANEOUS

## 2015-09-01 MED ORDER — DEXAMETHASONE SODIUM PHOSPHATE 4 MG/ML IJ SOLN
4.0000 mg | Freq: Two times a day (BID) | INTRAMUSCULAR | Status: DC
  Administered 2015-09-01 – 2015-09-08 (×14): 4 mg via INTRAVENOUS
  Filled 2015-09-01 (×14): qty 1

## 2015-09-01 MED ORDER — FOLIC ACID 1 MG PO TABS
1.0000 mg | ORAL_TABLET | Freq: Every day | ORAL | Status: DC
  Administered 2015-09-02 – 2015-09-08 (×7): 1 mg via ORAL
  Filled 2015-09-01 (×7): qty 1

## 2015-09-01 MED ORDER — VITAMIN B-12 1000 MCG PO TABS
1000.0000 ug | ORAL_TABLET | Freq: Every day | ORAL | Status: DC
  Administered 2015-09-01 – 2015-09-08 (×8): 1000 ug via ORAL
  Filled 2015-09-01 (×8): qty 1

## 2015-09-01 MED ORDER — ATORVASTATIN CALCIUM 10 MG PO TABS
10.0000 mg | ORAL_TABLET | Freq: Every day | ORAL | Status: DC
  Administered 2015-09-02 – 2015-09-08 (×7): 10 mg via ORAL
  Filled 2015-09-01 (×8): qty 1

## 2015-09-01 MED ORDER — METOPROLOL TARTRATE 1 MG/ML IV SOLN
5.0000 mg | Freq: Four times a day (QID) | INTRAVENOUS | Status: DC
  Filled 2015-09-01: qty 5

## 2015-09-01 MED ORDER — ENSURE ENLIVE PO LIQD
237.0000 mL | Freq: Two times a day (BID) | ORAL | Status: DC
  Administered 2015-09-02: 237 mL via ORAL
  Filled 2015-09-01 (×3): qty 237

## 2015-09-01 MED ORDER — BRIMONIDINE TARTRATE 0.2 % OP SOLN
1.0000 [drp] | Freq: Every day | OPHTHALMIC | Status: DC
  Administered 2015-09-01 – 2015-09-08 (×8): 1 [drp] via OPHTHALMIC
  Filled 2015-09-01: qty 5

## 2015-09-01 MED ORDER — ONDANSETRON HCL 4 MG PO TABS: 4.0000 mg | ORAL_TABLET | Freq: Four times a day (QID) | ORAL | Status: DC | PRN

## 2015-09-01 MED ORDER — INSULIN DETEMIR 100 UNIT/ML ~~LOC~~ SOLN
6.0000 [IU] | Freq: Every day | SUBCUTANEOUS | Status: DC
  Administered 2015-09-01: 6 [IU] via SUBCUTANEOUS
  Filled 2015-09-01 (×2): qty 0.06

## 2015-09-01 MED ORDER — ONDANSETRON HCL 4 MG/2ML IJ SOLN
4.0000 mg | Freq: Four times a day (QID) | INTRAMUSCULAR | Status: DC | PRN
  Filled 2015-09-01: qty 2

## 2015-09-01 MED ORDER — BRIMONIDINE TARTRATE-TIMOLOL 0.2-0.5 % OP SOLN
1.0000 [drp] | Freq: Every day | OPHTHALMIC | Status: DC
  Filled 2015-09-01: qty 5

## 2015-09-01 MED ORDER — METOPROLOL TARTRATE 1 MG/ML IV SOLN: 10.0000 mg | Freq: Three times a day (TID) | INTRAVENOUS | Status: DC | PRN

## 2015-09-01 MED ORDER — METOPROLOL TARTRATE 1 MG/ML IV SOLN
5.0000 mg | Freq: Three times a day (TID) | INTRAVENOUS | Status: DC | PRN
  Administered 2015-09-01 (×2): 5 mg via INTRAVENOUS
  Filled 2015-09-01: qty 5

## 2015-09-01 MED ORDER — METOPROLOL TARTRATE 1 MG/ML IV SOLN
10.0000 mg | Freq: Three times a day (TID) | INTRAVENOUS | Status: DC | PRN
  Filled 2015-09-01: qty 10

## 2015-09-01 MED ORDER — MEGESTROL ACETATE 20 MG PO TABS
20.0000 mg | ORAL_TABLET | Freq: Every day | ORAL | Status: DC
  Filled 2015-09-01: qty 1

## 2015-09-01 MED ORDER — METOPROLOL TARTRATE 1 MG/ML IV SOLN: 10.0000 mg | Freq: Four times a day (QID) | INTRAVENOUS | Status: DC

## 2015-09-01 MED ORDER — VITAMIN D 1000 UNITS PO TABS
5000.0000 [IU] | ORAL_TABLET | Freq: Every day | ORAL | Status: DC
Start: 2015-09-01 — End: 2015-09-09
  Administered 2015-09-01 – 2015-09-08 (×8): 5000 [IU] via ORAL
  Filled 2015-09-01 (×8): qty 5

## 2015-09-01 MED ORDER — METOPROLOL TARTRATE 25 MG PO TABS: 50.0000 mg | ORAL_TABLET | Freq: Two times a day (BID) | ORAL | Status: DC

## 2015-09-01 MED ORDER — METOPROLOL TARTRATE 1 MG/ML IV SOLN
10.0000 mg | Freq: Once | INTRAVENOUS | Status: AC
  Administered 2015-09-01: 10 mg via INTRAVENOUS

## 2015-09-01 MED ORDER — MEGESTROL ACETATE 400 MG/10ML PO SUSP
400.0000 mg | Freq: Two times a day (BID) | ORAL | Status: DC
  Filled 2015-09-01 (×2): qty 10

## 2015-09-01 NOTE — Progress Notes (Addendum)
Patient ID: Jeremy Jennings, male   DOB: April 16, 1936, 80 y.o.   MRN: [id number] [facility] PROGRESS NOTE  Jeremy Jennings [id number] DOB: 24-Jun-1936 DOA: 08/31/2015 PCP: [name], MD  Brief narrative:    80 y.o. male with a past medical history of prostate cancer with bone metastases, hypertension, diastolic dysfunction (last EF was 60% in 06/2015), type 2 diabetes, hyperlipidemia with multiple hospitalizations most recently 2/10 - 08/22/15 for symptomatic anemia (prior admission 07/28/2015 for anemia, 06/2015 c.diff colitis and septic shock).  Pt presented with pain in left leg. Doppler study was negative for DVT. Plan fimls demonstrated sclerotic osseous metastatic disease in left hemipelvis including the left acetabulum and then throughout the right hemipelvis, sacrum, lower lumbar spine and bilateral femoral area.  In ED, he is tachycardic in 170's (sinus tachycardia) and SBP in 220's. He was given multiple doses of metoprolol IV 5 mg but BP still up and HR now in 180's. We started labetalol drip, admission to SDU and cardiology consulted.    Assessment/Plan:    Principal Problem:   Intractable pain left lower extremity secondary to sclerotic bone metastases - LE doppler negative for DVT - No acute fracture seen on plain films - Will add steroids decadron 4 mg Q 12 hours to see if any improvement - Rad oncology consulted for possible palliative pain control  Active Problems:   Hyperlipidemia - Continue statin therapy    Accelerated hypertension - Sinus tachycardia likely due to pain  - Not responding to low metoprolol doses - Added labetalol drip and kept as needed IV metoprolol  - Cardiology consulted      Anemia of chronic disease - Due to malignancy - Hemoglobin 8.2 - No reports of bleeding    Thrombocytopenia - Due to malignancy - Platelets 110 - Using SCD's for DVT prophylaxis     Type 2 diabetes mellitus without complication with long term insulin use  (HCC) - Continue levemir 6 units at bedtime an dSSI   DVT Prophylaxis  - SCD's due to anemia    Code Status: Full.  Family Communication:  plan of care discussed with the patient Disposition Plan: pt in [facility] ED, admission to SDU due to accelerated hypertension   IV access:  Peripheral IV  Procedures and diagnostic studies:    Dg Hip Unilat With Pelvis 2-3 Views Left 08/31/2015   1. No acute osseous abnormality. 2. Sclerotic osseous metastatic disease throughout the left hemipelvis, including the entire left acetabulum. 3. Sclerotic osseous metastatic disease elsewhere throughout the right hemipelvis, sacrum, lower lumbar spine and bilateral proximal femora. 4. 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  Medical Consultants:  Cardiology   IAnti-Infectives:   None    Leisa Lenz, MD  [facility] Pager [phone]  Time spent in minutes: 25 minutes  If 7PM-7AM, please contact night-coverage [email or web address] Password [facility] 09/01/2015, 4:57 PM   LOS: 0 days    HPI/Subjective: No acute overnight events. Patient reports pain in left leg.   Objective: Filed Vitals:   09/01/15 1456 09/01/15 1500 09/01/15 1501 09/01/15 1600  BP: 92/49 103/54 103/54 121/70  Pulse: 113 113  114  Temp: 98.1 F (36.7 C)     TempSrc: Oral     Resp: 18 17  15   SpO2: 100% 100%  100%    Intake/Output Summary (Last 24 hours) at 09/01/15 1657 Last data filed at 09/01/15 1540  Gross per 24 hour  Intake      0 ml  Output  225 ml  Net   -225 ml    Exam:   General:  Pt is not in acute distress  Cardiovascular: Tachycardia, S1/S2 (+)  Respiratory: No wheezing, no crackles, no rhonchi  Abdomen: (+) BS, non tender   Extremities: No edema, pulses palpable bilaterally  Neuro: Grossly nonfocal  Data Reviewed: Basic Metabolic Panel:  Recent Labs Lab 08/31/15 1952 09/01/15 0412  NA 137 137  K 4.6 4.6  CL 105 107  CO2 21* 21*  GLUCOSE 238* 186*  BUN 26* 26*   CREATININE 1.16 1.13  CALCIUM 7.7* 7.4*  MG 1.8  --   PHOS 3.3  --    Liver Function Tests:  Recent Labs Lab 09/01/15 0412  AST 16  ALT 13*  ALKPHOS 314*  BILITOT 0.8  PROT 5.9*  ALBUMIN 1.5*   No results for input(s): LIPASE, AMYLASE in the last 168 hours. No results for input(s): AMMONIA in the last 168 hours. CBC:  Recent Labs Lab 08/31/15 1952 09/01/15 0412  WBC 6.5 7.0  NEUTROABS 5.5 5.8  HGB 8.9* 8.2*  HCT 28.3* 25.9*  MCV 91.3 90.9  PLT 102* 110*   Cardiac Enzymes:  Recent Labs Lab 08/31/15 1952  TROPONINI <0.03   BNP: Invalid input(s): POCBNP CBG:  Recent Labs Lab 09/01/15 0428 09/01/15 0820 09/01/15 1218  GLUCAP 169* 149* 153*    No results found for this or any previous visit (from the past 240 hour(s)).   Scheduled Meds: . atorvastatin  10 mg Oral q1800  . cholecalciferol  5,000 Units Oral Daily  . feeding supplement   237 mL Oral BID BM  . folic acid  1 mg Oral Daily  . insulin aspart  0-15 Units Subcutaneous TID WC  . insulin detemir  6 Units Subcutaneous QHS  . metoprolol  10 mg Intravenous 4 times per day  . saccharomyces boul  250 mg Oral BID  . tamsulosin  0.4 mg Oral Daily

## 2015-09-01 NOTE — Progress Notes (Signed)
VASCULAR LAB PRELIMINARY  PRELIMINARY  PRELIMINARY  PRELIMINARY  Left lower extremity venous duplex completed.    Preliminary report:  Left:  No evidence of DVT, superficial thrombosis, or Baker's cyst.  Jeremy Jennings, RVT 09/01/2015, 10:40 AM

## 2015-09-01 NOTE — Telephone Encounter (Signed)
Patient's appointments today were cancelled due to patient being hospitalized. This RN tried calling daughter, [name], but her voice mail is full and not able to leave a message.

## 2015-09-01 NOTE — ED Notes (Signed)
Hospitalist updated on pt status.  Orders given.

## 2015-09-01 NOTE — Telephone Encounter (Signed)
[name], patient's daughter called from [facility] ED asking for a call from Dr. Hazeline Junker nurse to give her guidance.  She states that patient has a 1:00 appt today in the [place] clinic.  Writer was unable to find an appt with Dr. Alen Blew.

## 2015-09-01 NOTE — ED Notes (Signed)
Pt did have urine sample in urinal in room. However by the time I got back in to empty urinal for sample, it was already dumped, but tnot saved. Pt is aware we need sample. Will notify when able to void again.

## 2015-09-01 NOTE — Progress Notes (Signed)
CARDIOLOGY CONSULT NOTE   Patient ID: Jeremy Jennings MRN: [id number] DOB/AGE: Jan 11, 1936 80 y.o.  Admit date: 08/31/2015  Primary Physician   Jeremy Danes, MD Primary Cardiologist   Jeremy Jeremy Jennings Reason for Consultation   HTN, elevated HR  [id number] Jeremy Jennings is a 80 y.o. year old male with a history of metastatic to bone prostate cancer, HTN, Jeremy-CHF (last EF was 75% 06/19/2015), DM2, HLD  - Hx GIB 2nd NSAIDs 05/2015.  - SBO 06/2015 w/ surg repair, cards was following for sinus tachycardia post-op, s/p bradycardic arrest in the setting of increasing respiratory distress 06/23/2015, 9 days after surgery, felt primary hypoxic event.  - Anemia req tx 07/2015 and 08/21/2015.   Came to ER 12/20 pm for hip pain, multiple sclerotic osseus metastases in the left hemipelvis seen on Xray. He received Toradol, percocet and dilaudid for pain. Pt home rx on hold and he developed tachycardia/HTN, cards asked to evaluate. His RN tried to give the metoprolol, but the Pyxis would not allow it because it was before 10 am. He is compliant with his home medications.   Jeremy Jennings denies chest pain or SOB. His main problem is pain, it is severe, despite rx. He does not recall any specific sx associated with the tachycardia, he feels generally weak and a little light-headed from the medications.   When his BP/HR were so high, Jeremy Jennings was given 10 mg of IV Lopressor and his BP/HR are better controlled. He is now on IV Lopressor 5 mg, 4 x day.     Past Medical History  Diagnosis Date  . Essential hypertension   . Type 2 diabetes mellitus ([place])   . Hyperlipidemia   . Prostate cancer ([place])     Stage 4  . History of GI bleed   . Sinus tachycardia (HCC)     Chronic  . PAC (premature atrial contraction)     Frequent  . PVC's (premature ventricular contractions)   . Chronic diastolic CHF (congestive heart failure) ([place])   . Hypertensive heart disease   . Protein calorie malnutrition ([place])   .  Anemia   . SBO (small bowel obstruction) ([place])     a. 06/2015: prolonged admissions for SBO s/p exlap/LOA c/b post-op ileus, fever, enterococcal UTI, a/c dCHF, acute encephalopathy, anemia, and readmission for septic shock due to C diff colitis.  . Cardiac arrest due to respiratory disorder ([place]) 06/23/2015    a. 06/2015: in setting of acute respiratory failure, felt to be primary resp event.  . C. difficile colitis     a. 06/2015: a/w septic shock.  . Hypoalbuminemia      Past Surgical History  Procedure Laterality Date  . Tonsillectomy    . Hemorroidectomy  1969  . Esophagogastroduodenoscopy N/A 05/28/2015    Procedure: ESOPHAGOGASTRODUODENOSCOPY (EGD);  Surgeon: Jeremy Irani, MD;  Location: [name];  Service: Endoscopy;  Laterality: N/A;  . Inguinal hernia repair Bilateral AFE 16-17  . Hernia repair  1981    Jeremy Jennings  . Laparotomy N/A 06/12/2015    Procedure: EXPLORATORY LAPAROTOMY;  Surgeon: Jeremy Seltzer, MD;  Location: [facility];  Service: General;  Laterality: N/A;  . Bowel resection N/A 06/12/2015    Procedure: SMALL BOWEL RESECTION;  Surgeon: Jeremy Seltzer, MD;  Location: [facility];  Service: General;  Laterality: N/A;    No Known Allergies  I have reviewed the patient's current medications . atorvastatin  10 mg Oral q1800  . brimonidine  1 drop  Right Eye Daily  . cholecalciferol  5,000 Units Oral Daily  . feeding supplement (ENSURE ENLIVE)  237 mL Oral BID BM  . folic acid  1 mg Oral Daily  . insulin aspart  0-15 Units Subcutaneous TID WC  . insulin detemir  6 Units Subcutaneous QHS  . metoprolol  5 mg Intravenous 4 times per day  . prednisoLONE acetate  1 drop Both Eyes BID  . saccharomyces boulardii  250 mg Oral BID  . tamsulosin  0.4 mg Oral Daily  . timolol  1 drop Right Eye Daily  . vitamin B-12  1,000 mcg Oral Daily     acetaminophen, HYDROmorphone (DILAUDID) injection, metoprolol, ondansetron **OR** ondansetron (ZOFRAN) IV  Medication Sig    acetaminophen (TYLENOL) 325 MG tablet Take 650 mg by mouth every 6 (six) hours as needed for mild pain or moderate pain.  atorvastatin (LIPITOR) 10 MG tablet Take 10 mg by mouth daily.  Cholecalciferol (VITAMIN D3) 5000 units TABS Take 5,000 Units by mouth daily.  COMBIGAN 0.2-0.5 % ophthalmic solution Place 1 drop into the right eye daily.   feeding supplement, ENSURE ENLIVE, (ENSURE ENLIVE) LIQD Take 237 mLs by mouth 2 (two) times daily between meals.  folic acid (FOLVITE) 1 MG tablet Take 1 mg by mouth daily.  furosemide (LASIX) 40 MG tablet Take 40 mg by mouth daily.   insulin aspart (NOVOLOG) 100 UNIT/ML injection Inject 0-9 Units into the skin 3 (three) times daily with meals as needed for high blood sugar. Pt uses per sliding scale:   70-150:  0 units  151-200:  1 unit  201-250:  2 units  251-300:  3 units  301-350:  5 units  351-400:  7 units  401-500:  9 units and notify MD  insulin detemir (LEVEMIR) 100 UNIT/ML injection Inject 6 Units into the skin at bedtime.  megestrol (MEGACE) 20 MG tablet Take 20 mg by mouth 2 (two) times daily. Takes along with 200mg  of liquid for a total of 220mg  at one time  megestrol (MEGACE) 400 MG/10ML suspension Take 10 mLs (400 mg total) by mouth 2 (two) times daily. Patient taking differently: Take 200 mg by mouth 2 (two) times daily. Takes with 20mg  tablet for a total of 220mg  at one time  metoprolol (LOPRESSOR) 50 MG tablet Take 1 tablet (50 mg total) by mouth 2 (two) times daily.  ondansetron (ZOFRAN) 4 MG tablet Take 4 mg by mouth every 8 (eight) hours as needed for nausea or vomiting. As directed  prednisoLONE acetate (PRED FORTE) 1 % ophthalmic suspension Place 1 drop into both eyes 2 (two) times daily.   saccharomyces boulardii (FLORASTOR) 250 MG capsule Take 1 capsule (250 mg total) by mouth 2 (two) times daily.  tamsulosin (FLOMAX) 0.4 MG CAPS capsule Take 0.4 mg by mouth daily.   vitamin B-12 (CYANOCOBALAMIN) 1000 MCG tablet Take 1,000 mcg  by mouth daily.     Social History   Social History  . Marital Status: Widowed    Spouse Name: N/A  . Number of Children: N/A  . Years of Education: N/A   Occupational History  . Retired    Social History Main Topics  . Smoking status: Former Smoker -- 0.25 packs/day for 20 years    Types: Cigarettes    Quit date: 07/12/1979  . Smokeless tobacco: Never Used  . Alcohol Use: No  . Drug Use: No  . Sexual Activity: Not Currently   Other Topics Concern  . Not on file  Social History Narrative    Family Status  Relation Status Death Age  . Mother Deceased   . Father Deceased   . Maternal Grandmother Deceased   . Maternal Grandfather Deceased   . Paternal Grandmother Deceased   . Paternal Grandfather Deceased    Family History  Problem Relation Age of Onset  . Cancer Neg Hx   . Heart attack Neg Hx     unknown  . Stroke Neg Hx     unknown  . Kidney failure Brother   . Kidney failure Sister   . Diabetes Mother   . Hypertension Father      ROS:  Full 14 point review of systems complete and found to be negative unless listed above.  Physical Exam: Blood pressure 221/105, pulse 168, temperature 98.4 F (36.9 C), temperature source Rectal, resp. rate 25, SpO2 100 %.  General: Well developed, well nourished, male in no acute distress Head: Eyes PERRLA, No xanthomas.   Normocephalic and atraumatic, oropharynx without edema or exudate. Dentition: poor Lungs: few rales bases Heart: HRRR S1 S2, no rub/gallop, 2/6 murmur. pulses are 2+ all 4 extrem.   Neck: No carotid bruits. No lymphadenopathy.  JVD minimal elevation. Abdomen: Bowel sounds present, abdomen soft and non-tender without masses or hernias noted. Msk: Generalized MS tenderness. Generalized weakness, no joint deformities or effusions. Extremities: No clubbing or cyanosis. No edema.  Neuro: Alert and oriented X 3. No focal deficits noted. Psych:  Good affect, responds appropriately Skin: No rashes or lesions  noted.  Labs:   Lab Results  Component Value Date   WBC 7.0 09/01/2015   HGB 8.2* 09/01/2015   HCT 25.9* 09/01/2015   MCV 90.9 09/01/2015   PLT 110* 09/01/2015     Recent Labs Lab 09/01/15 0412  NA 137  K 4.6  CL 107  CO2 21*  BUN 26*  CREATININE 1.13  CALCIUM 7.4*  PROT 5.9*  BILITOT 0.8  ALKPHOS 314*  ALT 13*  AST 16  GLUCOSE 186*  ALBUMIN 1.5*   MAGNESIUM  Date Value Ref Range Status  08/31/2015 1.8 1.7 - 2.4 mg/dL Final    Recent Labs  08/31/15 1952  TROPONINI <0.03    Echo: 06/29/2015 - Left ventricle: Hyperdynamic LV function but less cavity obliteration than previous and no LVOT gradient The cavity size was normal. Wall thickness was increased in a pattern of moderate LVH. Systolic function was vigorous. The estimated ejection fraction was in the range of 65% to 70%. Wall motion was normal; there were no regional wall motion abnormalities. Doppler parameters are consistent with elevated ventricular end-diastolic filling pressure. - Left atrium: The atrium was mildly dilated. - Atrial septum: No defect or patent foramen ovale was identified.  ECG:  02/20/217 ST, rate 109  09/01/2015 ?SVT, rate 174; artifact limits eval. Telemetry review more consistent w/ SR  Radiology:  Dg Hip Unilat With Pelvis 2-3 Views Left 08/31/2015  CLINICAL DATA:  80 year old with current history of metastatic prostate cancer, presenting with acute onset left hip pain. No recent injuries. EXAM: DG HIP (WITH OR WITHOUT PELVIS) 2-3V LEFT COMPARISON:  None. FINDINGS: No evidence of acute or subacute fracture or dislocation. Sclerotic osseous metastatic disease involving much of the left hemipelvis, including the entire left acetabulum. Mild to moderate axial joint space narrowing. Sclerotic osseous metastases elsewhere in the right side of the pelvis, the sacrum, the proximal femora, and the visualized lower lumbar spine. Sacroiliac joints and symphysis pubis  intact. Symmetric mild to moderate  axial joint space narrowing in the contralateral right hip. IMPRESSION: 1. No acute osseous abnormality. 2. Sclerotic osseous metastatic disease throughout the left hemipelvis, including the entire left acetabulum. 3. Sclerotic osseous metastatic disease elsewhere throughout the right hemipelvis, sacrum, lower lumbar spine and bilateral proximal femora. 4. 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 Electronically Signed   By: Evangeline Dakin M.Jeremy.   On: 08/31/2015 19:57    ASSESSMENT AND PLAN:   The patient was seen today by Jeremy Acie Fredrickson, the patient evaluated and the data reviewed.  Principal Problem: 1.  Intractable pain - per IM  2. Sinus tachycardia ([place])  - agree w/ IV Lopressor 4 x day, but will change to q 6 hr to avoid 12 hr period without rx - will also likely need prn rx, so would continue this. - his HR is likely to be elevated, would consider HR 110 good control  3.  Essential hypertension - BP probably higher than usual due to pain - with poor PO intake recently, OK to hold Lasix - BB can be titrated as needed, SBP currently 110, no dose change for now  Otherwise, per IM Active Problems:   Hyperlipidemia   Prostate cancer ([place])   Type 2 diabetes mellitus ([place])   Symptomatic anemia   Signed: Lenoard Aden 09/01/2015 11:53 AM Beeper [id number]  Co-Sign MD   Attending Note:   The patient was seen and examined.  Agree with assessment and plan as noted above.  Changes made to the above note as needed.  1. Sinus tachycardia : His tachycardia is related to his hip pain and the fact that he missed his metoprolol dose last night. In addition, he has been NPO apparently .   The sinus tach would be an expected result. I would resume his medications.   He may receive IV labetalol as needed Will sign off Call for questions   Ramond Dial., MD, [facility] 09/01/2015, 1:16 PM [address],  [place] Pager [phone]

## 2015-09-02 ENCOUNTER — Ambulatory Visit: Payer: Medicare Other | Admitting: Radiation Oncology

## 2015-09-02 ENCOUNTER — Encounter (HOSPITAL_COMMUNITY): Payer: Self-pay | Admitting: Internal Medicine

## 2015-09-02 DIAGNOSIS — I10 Essential (primary) hypertension: Secondary | ICD-10-CM | POA: Diagnosis present

## 2015-09-02 DIAGNOSIS — E1169 Type 2 diabetes mellitus with other specified complication: Secondary | ICD-10-CM | POA: Diagnosis present

## 2015-09-02 DIAGNOSIS — E119 Type 2 diabetes mellitus without complications: Secondary | ICD-10-CM

## 2015-09-02 DIAGNOSIS — D696 Thrombocytopenia, unspecified: Secondary | ICD-10-CM | POA: Diagnosis present

## 2015-09-02 DIAGNOSIS — L8990 Pressure ulcer of unspecified site, unspecified stage: Secondary | ICD-10-CM | POA: Insufficient documentation

## 2015-09-02 DIAGNOSIS — E785 Hyperlipidemia, unspecified: Secondary | ICD-10-CM

## 2015-09-02 DIAGNOSIS — C7951 Secondary malignant neoplasm of bone: Secondary | ICD-10-CM | POA: Diagnosis present

## 2015-09-02 DIAGNOSIS — I5032 Chronic diastolic (congestive) heart failure: Secondary | ICD-10-CM

## 2015-09-02 DIAGNOSIS — Z794 Long term (current) use of insulin: Secondary | ICD-10-CM

## 2015-09-02 DIAGNOSIS — D638 Anemia in other chronic diseases classified elsewhere: Secondary | ICD-10-CM | POA: Diagnosis present

## 2015-09-02 DIAGNOSIS — G893 Neoplasm related pain (acute) (chronic): Secondary | ICD-10-CM | POA: Diagnosis present

## 2015-09-02 DIAGNOSIS — E43 Unspecified severe protein-calorie malnutrition: Secondary | ICD-10-CM | POA: Diagnosis present

## 2015-09-02 LAB — BASIC METABOLIC PANEL
Anion gap: 10 (ref 5–15)
BUN: 31 mg/dL — AB (ref 6–20)
CALCIUM: 7.6 mg/dL — AB (ref 8.9–10.3)
CO2: 19 mmol/L — AB (ref 22–32)
CREATININE: 1.04 mg/dL (ref 0.61–1.24)
Chloride: 112 mmol/L — ABNORMAL HIGH (ref 101–111)
GFR calc Af Amer: >60 mL/min (ref >60)
GFR calc non Af Amer: >60 mL/min (ref >60)
GLUCOSE: 226 mg/dL — AB (ref 65–99)
Potassium: 4.8 mmol/L (ref 3.5–5.1)
Sodium: 141 mmol/L (ref 135–145)

## 2015-09-02 LAB — GLUCOSE, CAPILLARY
GLUCOSE-CAPILLARY: 236 mg/dL — AB (ref 65–99)
Glucose-Capillary: 194 mg/dL — ABNORMAL HIGH (ref 65–99)
Glucose-Capillary: 201 mg/dL — ABNORMAL HIGH (ref 65–99)
Glucose-Capillary: 228 mg/dL — ABNORMAL HIGH (ref 65–99)
Glucose-Capillary: 191 mg/dL — ABNORMAL HIGH (ref 65–99)

## 2015-09-02 LAB — CBC
HEMATOCRIT: 23.7 % — AB (ref 39.0–52.0)
Hemoglobin: 7.4 g/dL — ABNORMAL LOW (ref 13.0–17.0)
MCH: 28.9 pg (ref 26.0–34.0)
MCHC: 31.2 g/dL (ref 30.0–36.0)
MCV: 92.6 fL (ref 78.0–100.0)
Platelets: 89 10*3/uL — ABNORMAL LOW (ref 150–400)
RBC: 2.56 MIL/uL — ABNORMAL LOW (ref 4.22–5.81)
RDW: 18.9 % — AB (ref 11.5–15.5)
WBC: 7 10*3/uL (ref 4.0–10.5)

## 2015-09-02 MED ORDER — INSULIN DETEMIR 100 UNIT/ML ~~LOC~~ SOLN
8.0000 [IU] | Freq: Every day | SUBCUTANEOUS | Status: DC
  Administered 2015-09-02 – 2015-09-04 (×3): 8 [IU] via SUBCUTANEOUS
  Filled 2015-09-02 (×4): qty 0.08

## 2015-09-02 MED ORDER — ENSURE ENLIVE PO LIQD
237.0000 mL | Freq: Three times a day (TID) | ORAL | Status: DC
  Administered 2015-09-02 – 2015-09-08 (×11): 237 mL via ORAL

## 2015-09-02 MED ORDER — INSULIN ASPART 100 UNIT/ML ~~LOC~~ SOLN
3.0000 [IU] | Freq: Three times a day (TID) | SUBCUTANEOUS | Status: DC
  Administered 2015-09-03 – 2015-09-05 (×5): 3 [IU] via SUBCUTANEOUS

## 2015-09-02 MED ORDER — FUROSEMIDE 40 MG PO TABS
40.0000 mg | ORAL_TABLET | Freq: Every day | ORAL | Status: DC
  Administered 2015-09-03 – 2015-09-08 (×6): 40 mg via ORAL
  Filled 2015-09-02 (×6): qty 1

## 2015-09-02 MED ORDER — FUROSEMIDE 40 MG PO TABS: 40.0000 mg | ORAL_TABLET | Freq: Every day | ORAL | Status: DC

## 2015-09-02 MED ORDER — METOPROLOL TARTRATE 1 MG/ML IV SOLN
2.5000 mg | Freq: Four times a day (QID) | INTRAVENOUS | Status: DC
  Administered 2015-09-02 – 2015-09-03 (×4): 2.5 mg via INTRAVENOUS
  Filled 2015-09-02 (×4): qty 5

## 2015-09-02 NOTE — Progress Notes (Signed)
Inpatient Diabetes Program Recommendations  AACE/ADA: New Consensus Statement on Inpatient Glycemic Control (2015)  Target Ranges:  Prepandial:   less than 140 mg/dL      Peak postprandial:   less than 180 mg/dL (1-2 hours)      Critically ill patients:  140 - 180 mg/dL   Review of Glycemic Control  Results for TAVON, GONGWER (MRN [id number]) as of 09/02/2015 11:07  Ref. Range 09/01/2015 04:28 09/01/2015 08:20 09/01/2015 12:18 09/01/2015 22:23 09/02/2015 08:03  Glucose-Capillary Latest Ref Range: 65-99 mg/dL 169 (H) 149 (H) 153 (H) 194 (H) 201 (H)  Results for JUVENAL, RENNEY (MRN [id number]) as of 09/02/2015 11:07  Ref. Range 09/01/2015 04:12 09/02/2015 03:38  Glucose Latest Ref Range: 65-99 mg/dL 186 (H) 226 (H)  FBS and post-prandial blood sugars elevated.  Inpatient Diabetes Program Recommendations:    Increase Levemir to 8 units QHS Add Novolog 3 units tidwc for meal coverage insulin while on steroids.  Will continue to follow. Thank you. Lorenda Peck, RD, LDN, CDE Inpatient Diabetes Coordinator [phone]

## 2015-09-02 NOTE — Progress Notes (Signed)
Date: September 02, 2015 Chart reviewed for concurrent status and case management needs. Will continue to follow patient for changes and needs: anemia and resp failure Velva Harman, BSN, [place], [place]   [phone]

## 2015-09-02 NOTE — Progress Notes (Addendum)
Patient ID: Jeremy Jennings, male   DOB: Dec 14, 1935, 80 y.o.   MRN: [id number] [facility] PROGRESS NOTE  Jeremy Jennings [id number] DOB: 1936-04-09 DOA: 08/31/2015 PCP: [name], MD  Brief narrative:    80 y.o. male with a past medical history of prostate cancer with bone metastases, hypertension, diastolic dysfunction (last EF was 60% in 06/2015), type 2 diabetes, hyperlipidemia with multiple hospitalizations most recently 2/10 - 08/22/15 for symptomatic anemia (prior admission 07/28/2015 for anemia, 06/2015 c.diff colitis and septic shock).  Pt presented with pain in left leg. Doppler study was negative for DVT. Plan fimls demonstrated sclerotic osseous metastatic disease in left hemipelvis including the left acetabulum and then throughout the right hemipelvis, sacrum, lower lumbar spine and bilateral femoral area.   In ED, he was tachycardic in 170's (sinus tachycardia) and SBP in 220's. He was given multiple doses of metoprolol IV 5 mg but his BP was still up and HR in 180's. We started labetalol drip and consulted cardiology. For metastatic lesions in the leg we started decadron and consult placed to radiation oncology.  Assessment/Plan:    Principal Problem:   Intractable pain left lower extremity secondary to sclerotic bone metastases secondary to prostate cancer  - LE doppler negative for DVT - No acute fracture seen on plain films - We started decadron 4 mg Q 12 hours and pt reported his pain is much better this am although still has hard time moving his knees - Rad oncology consulted for possible palliative pain control, appreciate their input   Active Problems:   Accelerated hypertension - Sinus tachycardia likely due to pain  - Started labetalol drip on admission, much better BP today, 144/71 -He is off of labetalol drip and currently on metoprolol 2.5 mg IV Q 6 hours and as needed for BP over 150/90 - Cardiology following       Dyslipidemia associated with type  2 diabetes mellitus (HCC) - Continue statin therapy    Anemia of chronic disease - Due to malignancy - Hemoglobin down from 8.2 to 7.4 this am - Transfuse for hgb less than 7 - No reports of bleeding    Thrombocytopenia - Due to malignancy - Monitor CBC daily - Platelets 89 this am     Controlled, type 2 diabetes mellitus without complication with long term insulin use ([place]) - DM coordinator consulted - Increase levemir to 8 Units and add novolog 3 U TID AC along with SSI - A1c 6.3 about 3 months ago    Chronic diastolic CHF (congestive heart failure) (HCC) - Last 2 D ECHO in 06/2015 preserved EF - Compensated  - Resume lasix     Protein-calorie malnutrition, severe ([place]) - In the context of chronic illness - Nutrition consulted   DVT Prophylaxis  - SCD's bilaterally due to anemia and thrombocytopenia    Code Status: Full.  Family Communication:  plan of care discussed with the patient Disposition Plan: transfer to telemetry.  IV access:  Peripheral IV  Procedures and diagnostic studies:    Dg Hip Unilat With Pelvis 2-3 Views Left 08/31/2015   1. No acute osseous abnormality. 2. Sclerotic osseous metastatic disease throughout the left hemipelvis, including the entire left acetabulum. 3. Sclerotic osseous metastatic disease elsewhere throughout the right hemipelvis, sacrum, lower lumbar spine and bilateral proximal femora. 4. 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  Medical Consultants:  Cardiology  Rad oncology   IAnti-Infectives:   None    Leisa Lenz, MD  [facility] Pager [phone]  Time spent in minutes: 25 minutes  If 7PM-7AM, please contact night-coverage [email or web address] Password [facility] 09/02/2015, 4:17 PM   LOS: 1 day    HPI/Subjective: No acute overnight events. Patient reports he feels better.   Objective: Filed Vitals:   09/02/15 0200 09/02/15 0400 09/02/15 0600 09/02/15 0800  BP: 135/58 128/60 125/62   Pulse: 109 104  104   Temp:  98.4 F (36.9 C)  97.6 F (36.4 C)  TempSrc:  Oral  Oral  Resp: 14 12 28    Height:      Weight:      SpO2: 99% 99% 100%     Intake/Output Summary (Last 24 hours) at 09/02/15 1617 Last data filed at 09/02/15 0644  Gross per 24 hour  Intake      0 ml  Output    450 ml  Net   -450 ml    Exam:   General:  Pt is alert, awake, no acute distress   Cardiovascular: Rate controlled, appreciate S1 S2   Respiratory: No wheezing, no rhonchi   Abdomen: non tender, (+) BS  Extremities: trace LE edema, palpable pulses   Neuro: Nonfocal  Data Reviewed: Basic Metabolic Panel:  Recent Labs Lab 08/31/15 1952 09/01/15 0412 09/02/15 0338  NA 137 137 141  K 4.6 4.6 4.8  CL 105 107 112*  CO2 21* 21* 19*  GLUCOSE 238* 186* 226*  BUN 26* 26* 31*  CREATININE 1.16 1.13 1.04  CALCIUM 7.7* 7.4* 7.6*  MG 1.8  --   --   PHOS 3.3  --   --    Liver Function Tests:  Recent Labs Lab 09/01/15 0412  AST 16  ALT 13*  ALKPHOS 314*  BILITOT 0.8  PROT 5.9*  ALBUMIN 1.5*   No results for input(s): LIPASE, AMYLASE in the last 168 hours. No results for input(s): AMMONIA in the last 168 hours. CBC:  Recent Labs Lab 08/31/15 1952 09/01/15 0412 09/02/15 0338  WBC 6.5 7.0 7.0  NEUTROABS 5.5 5.8  --   HGB 8.9* 8.2* 7.4*  HCT 28.3* 25.9* 23.7*  MCV 91.3 90.9 92.6  PLT 102* 110* 89*   Cardiac Enzymes:  Recent Labs Lab 08/31/15 1952  TROPONINI <0.03   BNP: Invalid input(s): POCBNP CBG:  Recent Labs Lab 09/01/15 0428 09/01/15 0820 09/01/15 1218 09/01/15 2223 09/02/15 0803  GLUCAP 169* 149* 153* 194* 201*    Recent Results (from the past 240 hour(s))  MRSA PCR Screening     Status: None   Collection Time: 09/01/15  6:32 PM  Result Value Ref Range Status   MRSA by PCR NEGATIVE NEGATIVE Final    Comment:        The GeneXpert MRSA Assay (FDA approved for NASAL specimens only), is one component of a comprehensive MRSA colonization surveillance  program. It is not intended to diagnose MRSA infection nor to guide or monitor treatment for MRSA infections.      Scheduled Meds: . atorvastatin  10 mg Oral q1800  . cholecalciferol  5,000 Units Oral Daily  . feeding supplement   237 mL Oral BID BM  . folic acid  1 mg Oral Daily  . insulin aspart  0-15 Units Subcutaneous TID WC  . insulin detemir  6 Units Subcutaneous QHS  . metoprolol  10 mg Intravenous 4 times per day  . saccharomyces boul  250 mg Oral BID  . tamsulosin  0.4 mg Oral Daily

## 2015-09-02 NOTE — Progress Notes (Signed)
Initial Nutrition Assessment  DOCUMENTATION CODES:   Severe malnutrition in context of chronic illness  INTERVENTION:   Increase Ensure Enlive to TID, each supplement provides 350 kcal and 20 grams of protein Encourage PO intake RD to continue to monitor  NUTRITION DIAGNOSIS:   Malnutrition related to chronic illness as evidenced by percent weight loss, severe depletion of body fat, severe depletion of muscle mass.  GOAL:   Patient will meet greater than or equal to 90% of their needs  MONITOR:   PO intake, Supplement acceptance, Labs, Weight trends, I & O's  REASON FOR ASSESSMENT:   Malnutrition Screening Tool    ASSESSMENT:   80 y.o. male with a past medical history of prostate cancer with bone metastases, hypertension, diastolic dysfunction (last EF was 60% in 06/2015), type 2 diabetes, hyperlipidemia with multiple hospitalizations most recently 2/10 - 08/22/15 for symptomatic anemia (prior admission 07/28/2015 for anemia, 06/2015 c.diff colitis and septic shock).  Pt in room with no family present at bedside. Pt reports having eaten 1/2 [place] sandwich with 100% of his Ensure supplement for lunch. Pt reports continuing to take Megace. States he has been eating PTA. Denies any swallowing or chewing problems. States that he has noticed a new cough has developed after he eats, states it is not painful. Patient tries to eat small more frequent meals, declines daily snacks at this time. RD to increase Ensure order to TID.  Per weight history, pt has lost 26 lb since 1/05 (14% weight loss x 2 months, significant for time frame).  Nutrition-Focused physical exam completed. Findings are severe fat depletion, severe muscle depletion, and no edema.   Medications: Vitamin D tablet daily, folic acid tablet daily, B-12 tablet daily Labs reviewed: CBGs: 194-201  Diet Order:  Diet heart healthy/carb modified Room service appropriate?: Yes; Fluid consistency:: Thin  Skin:  Reviewed, no  issues  Last BM:  2/19  Height:   Ht Readings from Last 1 Encounters:  09/01/15 5\' 11"  (1.803 m)    Weight:   Wt Readings from Last 1 Encounters:  09/01/15 163 lb 12.8 oz (74.3 kg)    Ideal Body Weight:  78.2 kg  BMI:  Body mass index is 22.86 kg/(m^2).  Estimated Nutritional Needs:   Kcal:  2100-2300  Protein:  110-120g  Fluid:  2.2L/day  EDUCATION NEEDS:   No education needs identified at this time  Clayton Bibles, MS, RD, LDN Pager: [phone] After Hours Pager: [phone]

## 2015-09-02 NOTE — Progress Notes (Signed)
Pt arrived from ICU to floor A&Ox4. Slid pt to floor bed w/ 3 assist. VSS. No c/o at present. Dsg applied to small Stg II ulcer to left buttock. Pt oriented to callbell and environment. POC discussed. Callbell and bedside table in reach.

## 2015-09-03 ENCOUNTER — Ambulatory Visit: Payer: Medicare Other | Attending: Radiation Oncology | Admitting: Radiation Oncology

## 2015-09-03 ENCOUNTER — Ambulatory Visit
Admit: 2015-09-03 | Discharge: 2015-09-03 | Disposition: A | Discharge status: Home / Self Care | Payer: Medicare Other | Attending: Radiation Oncology | Admitting: Radiation Oncology

## 2015-09-03 DIAGNOSIS — C7951 Secondary malignant neoplasm of bone: Secondary | ICD-10-CM | POA: Insufficient documentation

## 2015-09-03 DIAGNOSIS — C61 Malignant neoplasm of prostate: Secondary | ICD-10-CM

## 2015-09-03 LAB — URINE MICROSCOPIC-ADD ON

## 2015-09-03 LAB — BASIC METABOLIC PANEL
Anion gap: 12 (ref 5–15)
BUN: 39 mg/dL — AB (ref 6–20)
CHLORIDE: 107 mmol/L (ref 101–111)
CO2: 19 mmol/L — AB (ref 22–32)
CREATININE: 0.95 mg/dL (ref 0.61–1.24)
Calcium: 8.1 mg/dL — ABNORMAL LOW (ref 8.9–10.3)
GFR calc Af Amer: >60 mL/min (ref >60)
GFR calc non Af Amer: >60 mL/min (ref >60)
GLUCOSE: 315 mg/dL — AB (ref 65–99)
Potassium: 5.1 mmol/L (ref 3.5–5.1)
SODIUM: 138 mmol/L (ref 135–145)

## 2015-09-03 LAB — GLUCOSE, CAPILLARY
GLUCOSE-CAPILLARY: 270 mg/dL — AB (ref 65–99)
GLUCOSE-CAPILLARY: 324 mg/dL — AB (ref 65–99)
GLUCOSE-CAPILLARY: 187 mg/dL — AB (ref 65–99)
GLUCOSE-CAPILLARY: 98 mg/dL (ref 65–99)

## 2015-09-03 LAB — CBC
HCT: 25.2 % — ABNORMAL LOW (ref 39.0–52.0)
HEMOGLOBIN: 7.9 g/dL — AB (ref 13.0–17.0)
MCH: 28.8 pg (ref 26.0–34.0)
MCHC: 31.3 g/dL (ref 30.0–36.0)
MCV: 92 fL (ref 78.0–100.0)
Platelets: 100 10*3/uL — ABNORMAL LOW (ref 150–400)
RBC: 2.74 MIL/uL — ABNORMAL LOW (ref 4.22–5.81)
RDW: 18.8 % — ABNORMAL HIGH (ref 11.5–15.5)
WBC: 7.2 10*3/uL (ref 4.0–10.5)

## 2015-09-03 LAB — URINALYSIS, ROUTINE W REFLEX MICROSCOPIC
BILIRUBIN URINE: NEGATIVE
GLUCOSE, UA: NEGATIVE mg/dL
Ketones, ur: NEGATIVE mg/dL
Nitrite: NEGATIVE
PROTEIN: 30 mg/dL — AB
Specific Gravity, Urine: 1.018 (ref 1.005–1.030)
pH: 6.5 (ref 5.0–8.0)

## 2015-09-03 MED ORDER — METOPROLOL TARTRATE 1 MG/ML IV SOLN
5.0000 mg | Freq: Four times a day (QID) | INTRAVENOUS | Status: DC
  Administered 2015-09-03 – 2015-09-08 (×19): 5 mg via INTRAVENOUS
  Filled 2015-09-03 (×18): qty 5

## 2015-09-03 NOTE — Progress Notes (Signed)
Patient ID: Jeremy Jennings, male   DOB: 1935-11-04, 80 y.o.   MRN: [id number] [facility] PROGRESS NOTE  Jeremy Jennings [id number] DOB: 05-Oct-1935 DOA: 08/31/2015 PCP: [name], MD  Brief narrative:    80 y.o. male with a past medical history of prostate cancer with bone metastases, hypertension, diastolic dysfunction (last EF was 60% in 06/2015), type 2 diabetes, hyperlipidemia with multiple hospitalizations most recently 2/10 - 08/22/15 for symptomatic anemia (prior admission 07/28/2015 for anemia, 06/2015 c.diff colitis and septic shock).  Pt presented with pain in left leg. Doppler study was negative for DVT. Plan fimls demonstrated sclerotic osseous metastatic disease in left hemipelvis including the left acetabulum and then throughout the right hemipelvis, sacrum, lower lumbar spine and bilateral femoral area.   In ED, he was tachycardic in 170's (sinus tachycardia) and SBP in 220's. He was given multiple doses of metoprolol IV 5 mg but his BP was still up and HR in 180's. We started labetalol drip and consulted cardiology. For metastatic lesions in the leg we started decadron and consult placed to radiation oncology.  Assessment/Plan:    Principal Problem:   Intractable pain left lower extremity secondary to sclerotic bone metastases secondary to prostate cancer  - LE doppler negative for DVT - No acute fracture seen on plain films - We started decadron 4 mg Q 12 hours and pt reported his pain is much better - Rad oncology consulted for possible palliative pain control, appreciate their input, plan for radiation treatment today   Active Problems:   Accelerated hypertension - Sinus tachycardia likely due to pain  - Started labetalol drip on admission, much better BP today, 144/71 - off of labetalol drip and currently on metoprolol 2.5 mg IV Q 6 hours and as needed for BP over 150/90, will increase the dose to 5 mg  - Cardiology following, assistance is appreciated        Dyslipidemia associated with type 2 diabetes mellitus ([place]) - Continue statin therapy    Anemia of chronic disease - Due to malignancy - Hemoglobin down, 7.9 this AM - Transfuse for hgb less than 7 - No reports of bleeding - CBC in AM    Thrombocytopenia - Due to malignancy - Monitor CBC daily    Controlled, type 2 diabetes mellitus without complication with long term insulin use ([place]) - DM coordinator consulted - Increased levemir to 8 Units and add novolog 3 U TID AC along with SSI - A1c 6.3 about 3 months ago    Chronic diastolic CHF (congestive heart failure) (HCC) - Last 2 D ECHO in 06/2015 preserved EF - Compensated  - Resumed lasix     Protein-calorie malnutrition, severe ([place]) - In the context of chronic illness - Nutrition consulted  DVT Prophylaxis  - SCD's bilaterally due to anemia and thrombocytopenia   Code Status: Full.  Family Communication:  plan of care discussed with the patient Disposition Plan: home by 2/26  IV access:  Peripheral IV  Procedures and diagnostic studies:    Dg Hip Unilat With Pelvis 2-3 Views Left 08/31/2015   1. No acute osseous abnormality. 2. Sclerotic osseous metastatic disease throughout the left hemipelvis, including the entire left acetabulum. 3. Sclerotic osseous metastatic disease elsewhere throughout the right hemipelvis, sacrum, lower lumbar spine and bilateral proximal femora. 4. 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  Medical Consultants:  Cardiology  Rad oncology   IAnti-Infectives:   None   [name], MD  [facility] Pager [phone]  If 7PM-7AM, please contact night-coverage [email or web address] Password [email or web address] 09/03/2015, 5:09 PM   LOS: 2 days    HPI/Subjective: No acute overnight events. Patient reports he feels better.   Objective: Filed Vitals:   09/02/15 2104 09/03/15 0604 09/03/15 0900 09/03/15 1223  BP: 118/48 144/77  143/71  Pulse: 97 99  111  Temp: 98 F (36.7 C)  97.6 F (36.4 C)  98 F (36.7 C)  TempSrc: Oral Oral    Resp: 18 18  18   Height:      Weight:   75.342 kg (166 lb 1.6 oz)   SpO2: 99% 100%  98%    Intake/Output Summary (Last 24 hours) at 09/03/15 1709 Last data filed at 09/03/15 1200  Gross per 24 hour  Intake    480 ml  Output    400 ml  Net     80 ml    Exam:   General:  Pt is alert, awake, no acute distress   Cardiovascular: Rate controlled, appreciate S1 S2   Respiratory: No wheezing, no rhonchi   Abdomen: non tender, (+) BS  Extremities: trace LE edema, palpable pulses   Neuro: Nonfocal  Data Reviewed: Basic Metabolic Panel:  Recent Labs Lab 08/31/15 1952 09/01/15 0412 09/02/15 0338 09/03/15 0445  NA 137 137 141 138  K 4.6 4.6 4.8 5.1  CL 105 107 112* 107  CO2 21* 21* 19* 19*  GLUCOSE 238* 186* 226* 315*  BUN 26* 26* 31* 39*  CREATININE 1.16 1.13 1.04 0.95  CALCIUM 7.7* 7.4* 7.6* 8.1*  MG 1.8  --   --   --   PHOS 3.3  --   --   --    Liver Function Tests:  Recent Labs Lab 09/01/15 0412  AST 16  ALT 13*  ALKPHOS 314*  BILITOT 0.8  PROT 5.9*  ALBUMIN 1.5*   CBC:  Recent Labs Lab 08/31/15 1952 09/01/15 0412 09/02/15 0338 09/03/15 0445  WBC 6.5 7.0 7.0 7.2  NEUTROABS 5.5 5.8  --   --   HGB 8.9* 8.2* 7.4* 7.9*  HCT 28.3* 25.9* 23.7* 25.2*  MCV 91.3 90.9 92.6 92.0  PLT 102* 110* 89* 100*   Cardiac Enzymes:  Recent Labs Lab 08/31/15 1952  TROPONINI <0.03   BNP: Invalid input(s): POCBNP CBG:  Recent Labs Lab 09/02/15 1239 09/02/15 1736 09/02/15 2103 09/03/15 0737 09/03/15 1209  GLUCAP 228* 191* 236* 270* 324*    Recent Results (from the past 240 hour(s))  MRSA PCR Screening     Status: None   Collection Time: 09/01/15  6:32 PM  Result Value Ref Range Status   MRSA by PCR NEGATIVE NEGATIVE Final    Comment:        The GeneXpert MRSA Assay (FDA approved for NASAL specimens only), is one component of a comprehensive MRSA colonization surveillance program. It is  not intended to diagnose MRSA infection nor to guide or monitor treatment for MRSA infections.      Marland Kitchen atorvastatin  10 mg Oral q1800  . brimonidine  1 drop Right Eye Daily  . cholecalciferol  5,000 Units Oral Daily  . dexamethasone  4 mg Intravenous Q12H  . feeding supplement (ENSURE ENLIVE)  237 mL Oral TID BM  . folic acid  1 mg Oral Daily  . furosemide  40 mg Oral Daily  . insulin aspart  0-15 Units Subcutaneous TID WC  . insulin aspart  3 Units Subcutaneous TID WC  . insulin detemir  8 Units Subcutaneous QHS  .  metoprolol  2.5 mg Intravenous 4 times per day  . prednisoLONE acetate  1 drop Both Eyes BID  . saccharomyces boulardii  250 mg Oral BID  . tamsulosin  0.4 mg Oral Daily  . timolol  1 drop Right Eye Daily  . vitamin B-12  1,000 mcg Oral Daily

## 2015-09-04 ENCOUNTER — Ambulatory Visit
Admit: 2015-09-04 | Discharge: 2015-09-04 | Disposition: A | Discharge status: Home / Self Care | Payer: Medicare Other | Attending: Radiation Oncology | Admitting: Radiation Oncology

## 2015-09-04 LAB — GLUCOSE, CAPILLARY
GLUCOSE-CAPILLARY: 150 mg/dL — AB (ref 65–99)
Glucose-Capillary: 236 mg/dL — ABNORMAL HIGH (ref 65–99)
Glucose-Capillary: 239 mg/dL — ABNORMAL HIGH (ref 65–99)
Glucose-Capillary: 238 mg/dL — ABNORMAL HIGH (ref 65–99)

## 2015-09-04 LAB — BASIC METABOLIC PANEL
ANION GAP: 9 (ref 5–15)
BUN: 45 mg/dL — ABNORMAL HIGH (ref 6–20)
CHLORIDE: 108 mmol/L (ref 101–111)
CO2: 21 mmol/L — ABNORMAL LOW (ref 22–32)
CREATININE: 1 mg/dL (ref 0.61–1.24)
Calcium: 8.1 mg/dL — ABNORMAL LOW (ref 8.9–10.3)
GFR calc non Af Amer: >60 mL/min (ref >60)
Glucose, Bld: 186 mg/dL — ABNORMAL HIGH (ref 65–99)
POTASSIUM: 4.8 mmol/L (ref 3.5–5.1)
SODIUM: 138 mmol/L (ref 135–145)

## 2015-09-04 LAB — CBC
HEMATOCRIT: 25.9 % — AB (ref 39.0–52.0)
HEMOGLOBIN: 8.2 g/dL — AB (ref 13.0–17.0)
MCH: 29.1 pg (ref 26.0–34.0)
MCHC: 31.7 g/dL (ref 30.0–36.0)
MCV: 91.8 fL (ref 78.0–100.0)
Platelets: 107 10*3/uL — ABNORMAL LOW (ref 150–400)
RBC: 2.82 MIL/uL — AB (ref 4.22–5.81)
RDW: 18.6 % — ABNORMAL HIGH (ref 11.5–15.5)
WBC: 6.6 10*3/uL (ref 4.0–10.5)

## 2015-09-04 LAB — URINE CULTURE

## 2015-09-04 MED ORDER — DEXTROSE 5 % IV SOLN
1.0000 g | INTRAVENOUS | Status: DC
  Administered 2015-09-04 – 2015-09-07 (×4): 1 g via INTRAVENOUS
  Filled 2015-09-04 (×4): qty 10

## 2015-09-04 NOTE — Consult Note (Signed)
Radiation Oncology         [phone] ________________________________  Initial inpatient Consultation  Name: Jeremy [facility] MRN: [id number]  Date: 08/31/2015  DOB: 1936-05-15  [id number], Jeremy Mussel, MD  No ref. provider found   REFERRING PHYSICIAN: No ref. provider found  DIAGNOSIS: The primary encounter diagnosis was Bony metastasis ([facility]). A diagnosis of Left leg pain was also pertinent to this visit.    ICD-9-CM ICD-10-CM   1. Bony metastasis ([place]) 198.5 C79.51   2. Left leg pain 729.5 M79.605     HISTORY OF PRESENT ILLNESS: Jeremy Jennings is a 80 y.o. male seen at the request of Dr. Alen Jennings for symptomatic bone metastases from advanced prostate cancer. Jeremy Jennings was originally diagnosed with prostate cancer in April 2015 when he had a PSA of 30 and a Gleason's score of 9 in 11 of 12 biopsies. Staging workup revealed metastatic bony disease with lymphadenopathy. He was treated with androgen deprivation with Dr. Junious Jennings in his PSA nadir down to 1.8. 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 His PSA continued to rise, and he has been receiving androgen deprivation therapy as well as monthly [facility]. Zytiga 1000 mg was started in September 2016 but has been on hold since November 2016 due to due an incarcerated hernia that required surgical decompression. He presented however for this hospitalization with increasing left hip pain of his known disease. He did undergo diagnostic 3 views of the left hip revealing sclerotic osseous metastatic disease throughout the left hemipelvis including the entire left acetabulum, right hemipelvis, sacrum, lower lumbar spine and bilateral proximal femora. Symmetric mild to moderate osteoarthritis of both hips was also noted. We are asked to see the patient for palliative radiotherapy to the hips for pain relief.   PREVIOUS RADIATION THERAPY:  No  PAST MEDICAL HISTORY: History reviewed. No pertinent past medical history.    PAST SURGICAL HISTORY: Past Surgical History  Procedure Laterality Date  . Tonsillectomy    . Hemorroidectomy  1969  . Esophagogastroduodenoscopy N/A 05/28/2015    Procedure: ESOPHAGOGASTRODUODENOSCOPY (EGD);  Surgeon: Jeremy Irani, MD;  Location: [facility];  Service: Endoscopy;  Laterality: N/A;  . Inguinal hernia repair Bilateral AFE 16-17  . Hernia repair  1981    DR [facility]  . Laparotomy N/A 06/12/2015    Procedure: EXPLORATORY LAPAROTOMY;  Surgeon: Jeremy Seltzer, MD;  Location: [facility];  Service: General;  Laterality: N/A;  . Bowel resection N/A 06/12/2015    Procedure: SMALL BOWEL RESECTION;  Surgeon: Jeremy Seltzer, MD;  Location: [facility];  Service: General;  Laterality: N/A;    FAMILY HISTORY:  Family History  Problem Relation Age of Onset  . Cancer Neg Hx   . Heart attack Neg Hx     unknown  . Stroke Neg Hx     unknown  . Kidney failure Brother   . Kidney failure Sister   . Diabetes Mother   . Hypertension Father     SOCIAL HISTORY:  Social History   Social History  . Marital Status: Widowed    Spouse Name: N/A  . Number of Children: N/A  . Years of Education: N/A   Occupational History  . Retired    Social History Main Topics  . Smoking status: Former Smoker -- 0.25 packs/day for 20 years    Types: Cigarettes    Quit date: 07/12/1979  . Smokeless tobacco: Never Used  .  Alcohol Use: No  . Drug Use: No  . Sexual Activity: Not Currently   Other Topics Concern  . Not on file   Social History Narrative    ALLERGIES: Review of patient's allergies indicates no known allergies.  MEDICATIONS:  Current Facility-Administered Medications  Medication Dose Route Frequency Provider Last Rate Last Dose  . acetaminophen (TYLENOL) tablet 650 mg  650 mg Oral Q6H PRN Jeremy Milan, MD   650 mg at 09/02/15 1038  . atorvastatin (LIPITOR) tablet 10 mg  10 mg Oral q1800  Jeremy Milan, MD   10 mg at 09/03/15 1811  . brimonidine (ALPHAGAN) 0.2 % ophthalmic solution 1 drop  1 drop Right Eye Daily Jeremy Lis, MD   1 drop at 09/03/15 [phone]  . cholecalciferol (VITAMIN D) tablet 5,000 Units  5,000 Units Oral Daily Jeremy Milan, MD   5,000 Units at 09/03/15 [phone]  . dexamethasone (DECADRON) injection 4 mg  4 mg Intravenous Q12H Jeremy Lis, MD   4 mg at 09/03/15 2102  . feeding supplement (ENSURE ENLIVE) (ENSURE ENLIVE) liquid 237 mL  237 mL Oral TID BM Jeremy Lis, MD   237 mL at 09/03/15 2102  . folic acid (FOLVITE) tablet 1 mg  1 mg Oral Daily Jeremy Milan, MD   1 mg at 09/03/15 0846  . furosemide (LASIX) tablet 40 mg  40 mg Oral Daily Jeremy Lis, MD   40 mg at 09/03/15 0847  . HYDROmorphone (DILAUDID) injection 1 mg  1 mg Intravenous Q4H PRN Jeremy Milan, MD   1 mg at 09/04/15 0820  . insulin aspart (novoLOG) injection 0-15 Units  0-15 Units Subcutaneous TID WC Jeremy Milan, MD   2 Units at 09/04/15 0818  . insulin aspart (novoLOG) injection 3 Units  3 Units Subcutaneous TID WC Jeremy Lis, MD   3 Units at 09/04/15 0820  . insulin detemir (LEVEMIR) injection 8 Units  8 Units Subcutaneous QHS Jeremy Lis, MD   8 Units at 09/03/15 2123  . metoprolol (LOPRESSOR) injection 10-20 mg  10-20 mg Intravenous Q8H PRN Theodis Blaze, MD      . metoprolol (LOPRESSOR) injection 5 mg  5 mg Intravenous 4 times per day Theodis Blaze, MD   5 mg at 09/03/15 2123  . ondansetron (ZOFRAN) tablet 4 mg  4 mg Oral Q6H PRN Jeremy Milan, MD       Or  . ondansetron [facility]) injection 4 mg  4 mg Intravenous Q6H PRN Jeremy Milan, MD      . prednisoLONE acetate (PRED FORTE) 1 % ophthalmic suspension 1 drop  1 drop Both Eyes BID Jeremy Milan, MD   1 drop at 09/03/15 2103  . saccharomyces boulardii (FLORASTOR) capsule 250 mg  250 mg Oral BID Jeremy Milan, MD   250 mg at 09/03/15 2102  . tamsulosin (FLOMAX) capsule 0.4 mg  0.4 mg Oral  Daily Jeremy Milan, MD   0.4 mg at 09/03/15 0847  . timolol (TIMOPTIC) 0.5 % ophthalmic solution 1 drop  1 drop Right Eye Daily Jeremy Lis, MD   1 drop at 09/03/15 [phone]  . vitamin B-12 (CYANOCOBALAMIN) tablet 1,000 mcg  1,000 mcg Oral Daily Jeremy Milan, MD   1,000 mcg at 09/03/15 [id number]    REVIEW OF SYSTEMS:  On review of systems the patient reports that overall he continues to have increasing pain in his left hip. He describes  that for the last 3 days he has been in excruciating pain in that this radiates down to the level of the knee anteriorly. He denies any difficulty with motor function and states that he is able to walk with the assistance of a walker, or cane but has been limited significantly due to pain. He denies any shortness of breath or chest pain. He is not experiencing nausea, vomiting, abdominal pain, fevers or chills. His appetite has not been as good as normal but he is trying to increase his oral intake. A complete review of systems is obtained and is otherwise negative.8   PHYSICAL EXAM:  height is 5\' 11"  (1.803 m) and weight is 164 lb 11.2 oz (74.707 kg). His oral temperature is 98 F (36.7 C). His blood pressure is 155/74 and his pulse is 90. His respiration is 16 and oxygen saturation is 100%.   Pain Scale 8/10  In general this is a well-appearing African American male in no acute distress. He's alert and oriented 4 and appropriate throughout the examination. The pulmonary assessment reveals no acute distress and he exhibits normal effort. Abdomen is intact with a midline laparotomy scar from his recent hernia repair. The abdomen is otherwise soft, nontender, nondistended. Pain is noted with light palpation over the left acetabulum and iliac region. This is not noted on the right. He does have pain with resistance with flexion of the left hip. This is not present on the right. He does not have any evidence of lower extremity edema or deep calf tenderness.  KPS =  70  100 - Normal; no complaints; no evidence of disease. 90   - Able to carry on normal activity; minor signs or symptoms of disease. 80   - Normal activity with effort; some signs or symptoms of disease. 22   - Cares for self; unable to carry on normal activity or to do active work. 60   - Requires occasional assistance, but is able to care for most of his personal needs. 50   - Requires considerable assistance and frequent medical care. 21   - Disabled; requires special care and assistance. 21   - Severely disabled; hospital admission is indicated although death not imminent. 75   - Very sick; hospital admission necessary; active supportive treatment necessary. 10   - Moribund; fatal processes progressing rapidly. 0     - Dead  Karnofsky DA, Abelmann [place], Craver LS and Burchenal [facility] [phone]) The use of the nitrogen mustards in the palliative treatment of carcinoma: with particular reference to bronchogenic carcinoma Cancer 1 634-56  LABORATORY DATA:  Lab Results  Component Value Date   WBC 6.6 09/04/2015   HGB 8.2* 09/04/2015   HCT 25.9* 09/04/2015   MCV 91.8 09/04/2015   PLT 107* 09/04/2015   Lab Results  Component Value Date   NA 138 09/04/2015   K 4.8 09/04/2015   CL 108 09/04/2015   CO2 21* 09/04/2015   Lab Results  Component Value Date   ALT 13* 09/01/2015   AST 16 09/01/2015   ALKPHOS 314* 09/01/2015   BILITOT 0.8 09/01/2015     RADIOGRAPHY: Dg Hip Unilat With Pelvis 2-3 Views Left  08/31/2015  CLINICAL DATA:  80 year old with current history of metastatic prostate cancer, presenting with acute onset left hip pain. No recent injuries. EXAM: DG HIP (WITH OR WITHOUT PELVIS) 2-3V LEFT COMPARISON:  None. FINDINGS: No evidence of acute or subacute fracture or dislocation. Sclerotic osseous metastatic disease involving much of the  left hemipelvis, including the entire left acetabulum. Mild to moderate axial joint space narrowing. Sclerotic osseous metastases elsewhere in the  right side of the pelvis, the sacrum, the proximal femora, and the visualized lower lumbar spine. Sacroiliac joints and symphysis pubis intact. Symmetric mild to moderate axial joint space narrowing in the contralateral right hip. IMPRESSION: 1. No acute osseous abnormality. 2. Sclerotic osseous metastatic disease throughout the left hemipelvis, including the entire left acetabulum. 3. Sclerotic osseous metastatic disease elsewhere throughout the right hemipelvis, sacrum, lower lumbar spine and bilateral proximal femora. 4. 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 Electronically Signed   By: Evangeline Dakin M.D.   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 We would recommend 30 grays in 10 fractions. The patient is interested in moving forward. We will plan for simulation today and anticipate treatment beginning tomorrow. He states agreement and understanding. The risks, benefits and strategy of radiotherapy were discussed. The patient has signed written consent.   Carola Rhine, PAC

## 2015-09-04 NOTE — Care Management Important Message (Signed)
Important Message  Patient Details  Name: Jeremy [facility] MRN: [id number] Date of Birth: 06/24/1936   Medicare Important Message Given:  Yes    Camillo Flaming 09/04/2015, 1:04 [place] Message  Patient Details  Name: Jeremy [facility] MRN: [id number] Date of Birth: 04-02-1936   Medicare Important Message Given:  Yes    Camillo Flaming 09/04/2015, 1:04 PM

## 2015-09-04 NOTE — Evaluation (Signed)
Occupational Therapy Evaluation Patient Details Name: Jeremy [facility] MRN: [id number] DOB: July 14, 1935 Today's Date: 09/04/2015    History of Present Illness 80 yo male admitted with symptomatic anemia. Hx of prostate cancer, DM, HTN, falls. Recent stay at SNF.    Clinical Impression   Pt admitted with above. He demonstrates the below listed deficits and will benefit from continued OT to maximize safety and independence with BADLs.  Pt presents to OT with severe pain Lt shoulder and Lt hip which limits all activity.  Pt also very lethargic with confusion noted.  He currently requires total A with all ADLs, and unable to tolerate HOB > 30* due to increased pain.   He will likely need 24 hour mod - max A at discharge, depending on pain.   If family can provide this recommend home with [employer], but he may need SNF.       Follow Up Recommendations  SNF;Supervision/Assistance - 24 hour    Equipment Recommendations  None recommended by OT    Recommendations for Other Services       Precautions / Restrictions Precautions Precautions: Fall      Mobility Bed Mobility Overal bed mobility: Needs Assistance Bed Mobility: Rolling Rolling: Total assist         General bed mobility comments: limited by severe hip pain with all attempts at mobility   Transfers Overall transfer level: Needs assistance               General transfer comment: unable to attempt due to pain     Balance                                            ADL Overall ADL's : Needs assistance/impaired Eating/Feeding: Maximal assistance;Bed level   Grooming: Wash/dry hands;Wash/dry face;Oral care;Total assistance;Bed level   Upper Body Bathing: Total assistance;Bed level   Lower Body Bathing: Total assistance;Bed level   Upper Body Dressing : Total assistance;Bed level   Lower Body Dressing: Total assistance;Bed level   Toilet Transfer: Total assistance Toilet Transfer Details  (indicate cue type and reason): unable to attempt  Toileting- Clothing Manipulation and Hygiene: Total assistance;Bed level       Functional mobility during ADLs: Total assistance General ADL Comments: Pt unable to engage in ADL activities due to confusion, lethargy and pain      Vision     Perception     Praxis      Pertinent Vitals/Pain Pain Assessment: Faces Faces Pain Scale: Hurts whole lot Pain Location: Lt shoulder with AAROM, Lt hip  Pain Descriptors / Indicators: Aching;Grimacing;Guarding Pain Intervention(s): Limited activity within patient's tolerance;Monitored during session     Hand Dominance Right   Extremity/Trunk Assessment Upper Extremity Assessment Upper Extremity Assessment: LUE deficits/detail;Generalized weakness LUE Deficits / Details: Pt indicates severe pain Lt shoulder.  Will not attempt to raise Lt UE.  When AAROM/PROM provided, pt cries out in pain.  He reports pain anterior aspect of shoulder.   Elbow distally appears [facility]    Lower Extremity Assessment Lower Extremity Assessment: Defer to PT evaluation       Communication Communication Communication: No difficulties (low volume )   Cognition Arousal/Alertness: Lethargic Behavior During Therapy: Flat affect Overall Cognitive Status: Impaired/Different from baseline Area of Impairment: Orientation;Attention;Following commands;Problem solving Orientation Level: Disoriented to;Place;Time;Situation Current Attention Level: Focused   Following Commands: Follows one step commands  inconsistently;Follows one step commands with increased time     Problem Solving: Slow processing;Decreased initiation General Comments: Pt repeatedly thought he was at home.  He is easily distracted and requires instruction repeated multiple times    General Comments       Exercises       Shoulder Instructions      Home Living Family/patient expects to be discharged to:: Private residence Living Arrangements:  Children Available Help at Discharge: Family;Available PRN/intermittently Type of Home: House Home Access: Stairs to enter [employer] of Steps: 2   Home Layout: One level     Bathroom Shower/Tub: Occupational psychologist: Handicapped height     Home Equipment: Crutches;Cane - single point;Walker - 2 wheels;Shower seat - built in   Additional Comments: info gleaned from last admission as pt was unable to provide information.  No family present to clarify accuracy       Prior Functioning/Environment Level of Independence: Needs assistance  Gait / Transfers Assistance Needed: uses walker     Comments: information gleaned from last admission.  Pt unable to provide info and no family present to clarify     OT Diagnosis: Generalized weakness;Cognitive deficits;Acute pain   OT Problem List: Decreased strength;Decreased activity tolerance;Decreased range of motion;Impaired balance (sitting and/or standing);Decreased cognition;Decreased safety awareness;Decreased knowledge of use of DME or AE;Decreased knowledge of precautions;Pain;Impaired UE functional use   OT Treatment/Interventions: Self-care/ADL training;Therapeutic exercise;DME and/or AE instruction;Therapeutic activities;Cognitive remediation/compensation;Patient/family education;Balance training    OT Goals(Current goals can be found in the care plan section) Acute Rehab OT Goals OT Goal Formulation: Patient unable to participate in goal setting Time For Goal Achievement: 09/18/15 Potential to Achieve Goals: Fair ADL Goals Pt Will Perform Grooming: with min assist;sitting Pt Will Perform Upper Body Bathing: with min assist;sitting Pt Will Perform Lower Body Bathing: with mod assist;sit to/from stand;with adaptive equipment Pt Will Perform Lower Body Dressing: with mod assist;sit to/from stand;with adaptive equipment Pt Will Transfer to Toilet: with mod assist;stand pivot transfer;bedside commode  OT  Frequency: Min 2X/week   Barriers to D/C: Decreased caregiver support  unsure if family can provide necessary level of care        Co-evaluation              End of Session Nurse Communication: Mobility status  Activity Tolerance: Patient limited by pain;Patient limited by lethargy Patient left: in bed;with call bell/phone within reach;with bed alarm set   Time: [id number] OT Time Calculation (min): 12 min Charges:  OT General Charges $OT Visit: 1 Procedure OT Evaluation $OT Eval Moderate Complexity: 1 Procedure G-Codes:    Kashia Brossard M 09/12/2015, 8:02 PM

## 2015-09-04 NOTE — Progress Notes (Signed)
Patient ID: Jeremy Jennings, male   DOB: 09/18/1935, 80 y.o.   MRN: [id number] [facility] PROGRESS NOTE  Jeremy Jennings [id number] DOB: [birth year] DOA: 08/31/2015 PCP: [name], MD  Brief narrative:    80 y.o. male with a past medical history of prostate cancer with bone metastases, hypertension, diastolic dysfunction (last EF was 60% in 06/2015), type 2 diabetes, hyperlipidemia with multiple hospitalizations most recently 2/10 - 08/22/15 for symptomatic anemia (prior admission 07/28/2015 for anemia, 06/2015 c.diff colitis and septic shock).  Pt presented with pain in left leg. Doppler study was negative for DVT. Plan fimls demonstrated sclerotic osseous metastatic disease in left hemipelvis including the left acetabulum and then throughout the right hemipelvis, sacrum, lower lumbar spine and bilateral femoral area.   In ED, he was tachycardic in 170's (sinus tachycardia) and SBP in 220's. He was given multiple doses of metoprolol IV 5 mg but his BP was still up and HR in 180's. We started labetalol drip and consulted cardiology. For metastatic lesions in the leg we started decadron and consult placed to radiation oncology.  Assessment/Plan:    Principal Problem:   Intractable pain left lower extremity secondary to sclerotic bone metastases secondary to prostate cancer  - LE doppler negative for DVT - No acute fracture seen on plain films - We started decadron 4 mg Q 12 hours and pt reported his pain is much better - Rad oncology consulted for possible palliative radiation, simulation done 2/23, pt tolerated well   Active Problems:   Accelerated hypertension - Sinus tachycardia likely due to pain  - Started labetalol drip on admission, much better BP today, 131/66 - currently on metoprolol 5 mg IV Q 6 hours and as needed for BP over 150/90      Dyslipidemia associated with type 2 diabetes mellitus ([place]) - Continue statin therapy    Anemia of chronic disease - Due to  malignancy - Transfuse for hgb less than 7 - No reports of bleeding - CBC in AM    Thrombocytopenia - Due to malignancy - resolved     Controlled, type 2 diabetes mellitus without complication with long term insulin use ([place]) - DM coordinator consulted - Increased levemir to 8 Units and add novolog 3 U TID AC along with SSI - A1c 6.3 about 3 months ago    Chronic diastolic CHF (congestive heart failure) ([place]) - Last 2 D ECHO in 06/2015 preserved EF - Compensated  - continue Lasix     Protein-calorie malnutrition, severe ([place]) - In the context of chronic illness - Nutrition consulted    Dysuria  - with UA suggestive of UT - started empiric Rocephin, urine culture pending   DVT Prophylaxis  - SCD's bilaterally due to anemia and thrombocytopenia   Code Status: Full.  Family Communication:  plan of care discussed with the patient and daughter at bedside  Disposition Plan: home by 2/27  IV access:  Peripheral IV  Procedures and diagnostic studies:    Dg Hip Unilat With Pelvis 2-3 Views Left 08/31/2015   1. No acute osseous abnormality. 2. Sclerotic osseous metastatic disease throughout the left hemipelvis, including the entire left acetabulum. 3. Sclerotic osseous metastatic disease elsewhere throughout the right hemipelvis, sacrum, lower lumbar spine and bilateral proximal femora. 4. 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  Medical Consultants:  Cardiology  Rad oncology   IAnti-Infectives:   Rocephin 2/24 -->  [name], MD  [facility] Pager [phone]   If 7PM-7AM, please  contact night-coverage [email or web address] Password [facility] 09/04/2015, 3:47 PM   LOS: 3 days    HPI/Subjective: No acute overnight events. Patient reports he feels better.   Objective: Filed Vitals:   09/04/15 0431 09/04/15 0700 09/04/15 1231 09/04/15 1450  BP: 155/74  118/62 131/66  Pulse: 90  82 103  Temp: 98 F (36.7 C)   98.3 F (36.8 C)  TempSrc: Oral    Oral  Resp: 16     Height:      Weight:  74.707 kg (164 lb 11.2 oz)    SpO2: 100%   100%    Intake/Output Summary (Last 24 hours) at 09/04/15 1547 Last data filed at 09/04/15 1300  Gross per 24 hour  Intake    650 ml  Output   1025 ml  Net   -375 ml    Exam:   General:  Pt is alert, awake, no acute distress   Cardiovascular: Rate controlled, appreciate S1 S2   Respiratory: No wheezing, no rhonchi   Abdomen: non tender, (+) BS  Extremities: palpable pulses PT and DP  Neuro: Nonfocal  Data Reviewed: Basic Metabolic Panel:  Recent Labs Lab 08/31/15 1952 09/01/15 0412 09/02/15 0338 09/03/15 0445 09/04/15 0455  NA 137 137 141 138 138  K 4.6 4.6 4.8 5.1 4.8  CL 105 107 112* 107 108  CO2 21* 21* 19* 19* 21*  GLUCOSE 238* 186* 226* 315* 186*  BUN 26* 26* 31* 39* 45*  CREATININE 1.16 1.13 1.04 0.95 1.00  CALCIUM 7.7* 7.4* 7.6* 8.1* 8.1*  MG 1.8  --   --   --   --   PHOS 3.3  --   --   --   --    Liver Function Tests:  Recent Labs Lab 09/01/15 0412  AST 16  ALT 13*  ALKPHOS 314*  BILITOT 0.8  PROT 5.9*  ALBUMIN 1.5*   CBC:  Recent Labs Lab 08/31/15 1952 09/01/15 0412 09/02/15 0338 09/03/15 0445 09/04/15 0455  WBC 6.5 7.0 7.0 7.2 6.6  NEUTROABS 5.5 5.8  --   --   --   HGB 8.9* 8.2* 7.4* 7.9* 8.2*  HCT 28.3* 25.9* 23.7* 25.2* 25.9*  MCV 91.3 90.9 92.6 92.0 91.8  PLT 102* 110* 89* 100* 107*   Cardiac Enzymes:  Recent Labs Lab 08/31/15 1952  TROPONINI <0.03   CBG:  Recent Labs Lab 09/03/15 1209 09/03/15 1710 09/03/15 2058 09/04/15 0741 09/04/15 1408  GLUCAP 324* 187* 98 150* 236*    Recent Results (from the past 240 hour(s))  MRSA PCR Screening     Status: None   Collection Time: 09/01/15  6:32 PM  Result Value Ref Range Status   MRSA by PCR NEGATIVE NEGATIVE Final    Comment:        The GeneXpert MRSA Assay (FDA approved for NASAL specimens only), is one component of a comprehensive MRSA colonization surveillance program.  It is not intended to diagnose MRSA infection nor to guide or monitor treatment for MRSA infections.   Culture, Urine     Status: None   Collection Time: 09/03/15  8:59 PM  Result Value Ref Range Status   Specimen Description URINE, CLEAN CATCH  Final   Special Requests NONE  Final   Culture   Final    MULTIPLE SPECIES PRESENT, SUGGEST RECOLLECTION Performed at [facility]    Report Status 09/04/2015 FINAL  Final     . atorvastatin  10 mg Oral q1800  . brimonidine  1 drop Right Eye Daily  . cefTRIAXone (ROCEPHIN)  IV  1 g Intravenous Q24H  . cholecalciferol  5,000 Units Oral Daily  . dexamethasone  4 mg Intravenous Q12H  . feeding supplement (ENSURE ENLIVE)  237 mL Oral TID BM  . folic acid  1 mg Oral Daily  . furosemide  40 mg Oral Daily  . insulin aspart  0-15 Units Subcutaneous TID WC  . insulin aspart  3 Units Subcutaneous TID WC  . insulin detemir  8 Units Subcutaneous QHS  . metoprolol  5 mg Intravenous 4 times per day  . prednisoLONE acetate  1 drop Both Eyes BID  . saccharomyces boulardii  250 mg Oral BID  . tamsulosin  0.4 mg Oral Daily  . timolol  1 drop Right Eye Daily  . vitamin B-12  1,000 mcg Oral Daily

## 2015-09-04 NOTE — Discharge Planning (Signed)
Patient has been referred to [facility] for Skilled Nursing, PT, OT, and [place].  Please notify Arville Go at patient discharge for assistance at [phone]

## 2015-09-05 ENCOUNTER — Inpatient Hospital Stay (HOSPITAL_COMMUNITY): Payer: Medicare Other

## 2015-09-05 DIAGNOSIS — Z794 Long term (current) use of insulin: Secondary | ICD-10-CM

## 2015-09-05 DIAGNOSIS — E1169 Type 2 diabetes mellitus with other specified complication: Secondary | ICD-10-CM

## 2015-09-05 DIAGNOSIS — E119 Type 2 diabetes mellitus without complications: Secondary | ICD-10-CM

## 2015-09-05 DIAGNOSIS — C7951 Secondary malignant neoplasm of bone: Principal | ICD-10-CM

## 2015-09-05 LAB — CBC
HEMATOCRIT: 22.4 % — AB (ref 39.0–52.0)
HEMOGLOBIN: 7.1 g/dL — AB (ref 13.0–17.0)
MCH: 28.9 pg (ref 26.0–34.0)
MCHC: 31.7 g/dL (ref 30.0–36.0)
MCV: 91.1 fL (ref 78.0–100.0)
Platelets: 67 10*3/uL — ABNORMAL LOW (ref 150–400)
RBC: 2.46 MIL/uL — ABNORMAL LOW (ref 4.22–5.81)
RDW: 18.3 % — AB (ref 11.5–15.5)
WBC: 6.1 10*3/uL (ref 4.0–10.5)

## 2015-09-05 LAB — BASIC METABOLIC PANEL
Anion gap: 11 (ref 5–15)
BUN: 50 mg/dL — AB (ref 6–20)
CALCIUM: 7.8 mg/dL — AB (ref 8.9–10.3)
CO2: 20 mmol/L — AB (ref 22–32)
CREATININE: 0.94 mg/dL (ref 0.61–1.24)
Chloride: 107 mmol/L (ref 101–111)
GFR calc non Af Amer: >60 mL/min (ref >60)
Glucose, Bld: 282 mg/dL — ABNORMAL HIGH (ref 65–99)
Potassium: 4.7 mmol/L (ref 3.5–5.1)
SODIUM: 138 mmol/L (ref 135–145)

## 2015-09-05 LAB — GLUCOSE, CAPILLARY
GLUCOSE-CAPILLARY: 222 mg/dL — AB (ref 65–99)
GLUCOSE-CAPILLARY: 177 mg/dL — AB (ref 65–99)
Glucose-Capillary: 212 mg/dL — ABNORMAL HIGH (ref 65–99)
Glucose-Capillary: 199 mg/dL — ABNORMAL HIGH (ref 65–99)

## 2015-09-05 LAB — MRSA PCR SCREENING: MRSA by PCR: NEGATIVE

## 2015-09-05 MED ORDER — INSULIN DETEMIR 100 UNIT/ML ~~LOC~~ SOLN
12.0000 [IU] | Freq: Every day | SUBCUTANEOUS | Status: DC
  Administered 2015-09-06 (×2): 12 [IU] via SUBCUTANEOUS
  Filled 2015-09-05 (×2): qty 0.12

## 2015-09-05 MED ORDER — INSULIN ASPART 100 UNIT/ML ~~LOC~~ SOLN: 4.0000 [IU] | Freq: Three times a day (TID) | SUBCUTANEOUS | Status: DC

## 2015-09-05 MED ORDER — INSULIN ASPART 100 UNIT/ML ~~LOC~~ SOLN
4.0000 [IU] | Freq: Three times a day (TID) | SUBCUTANEOUS | Status: DC
  Administered 2015-09-06 – 2015-09-07 (×2): 4 [IU] via SUBCUTANEOUS

## 2015-09-05 NOTE — Progress Notes (Addendum)
Patient ID: Jeremy Jennings, male   DOB: [birth year], 80 y.o.   MRN: [id number] [facility] PROGRESS NOTE  Jeremy Jennings [id number] DOB: Nov 23, 1935 DOA: 08/31/2015 PCP: [name], MD  Brief narrative:    80 y.o. male with a past medical history of prostate cancer with bone metastases, hypertension, diastolic dysfunction (last EF was 60% in 06/2015), type 2 diabetes, hyperlipidemia with multiple hospitalizations most recently 2/10 - 08/22/15 for symptomatic anemia (prior admission 07/28/2015 for anemia, 06/2015 c.diff colitis and septic shock).  Pt presented with pain in left leg. Doppler study was negative for DVT. Plan fimls demonstrated sclerotic osseous metastatic disease in left hemipelvis including the left acetabulum and then throughout the right hemipelvis, sacrum, lower lumbar spine and bilateral femoral area.   In ED, he was tachycardic in 170's (sinus tachycardia) and SBP in 220's. He was given multiple doses of metoprolol IV 5 mg but his BP was still up and HR in 180's. We started labetalol drip and consulted cardiology. For metastatic lesions in the leg we started decadron and consulted radiation oncology for palliative radiation.   Assessment/Plan:    Principal Problem:  Intractable pain left lower extremity secondary to sclerotic bone metastases secondary to prostate cancer  - Pt had LE doppler on admission which was negative for DVT - No acute fractures on x rays - Pain better with decadron 4 mg IV Q 12 hours  - Palliative rad to right and left hip and right sacrum started   Active Problems:   UTI - Started rocephin 2/24 - Multiple species on UCx none predominant    Left shoulder pain - Obtain x ray to evaluate the pain   Accelerated hypertension / Sinus tachycardia  - Sinus tachycardia and elevated BP likely due to pain  - Started labetalol drip on admission, much better BP since - Now on lasix and metoprolol - BP 133/60    Dyslipidemia associated  with type 2 diabetes mellitus ([facility]) - Continue atorvastatin    Anemia of chronic disease - Secondary to malignancy  - Transfuse if hgb less than 7   Thrombocytopenia - Secondary to malignancy - Platelets 67 - No reports of bleeding    Controlled, type 2 diabetes mellitus without complication with long term insulin use ([facility]) - CBG's in past 24 hours: 238, 212, 222 - Will increase Levemir to 12 units and novolog to 4 units TID - A1c was 6.3 about 3 months ago   Chronic diastolic CHF (congestive heart failure) ([facility]) - Last 2 D ECHO in 06/2015 preserved EF - Compensated  - We will continue lasix    Protein-calorie malnutrition, severe ([facility]) - In the context of chronic illness - Diet as tolerated   DVT Prophylaxis  - SCD's bilaterally due to thrombocytopenia   Code Status: Full.  Family Communication:  plan of care discussed with the patient and his daughter 2/25 Disposition Plan: home once pain better controlled, possibly by 2/27 or 2/28  IV access:  Peripheral IV  Procedures and diagnostic studies:    Dg Shoulder Left Port 09/05/2015  No acute abnormality.   Dg Hip Unilat With Pelvis 2-3 Views Left 08/31/2015 1. No acute osseous abnormality. 2. Sclerotic osseous metastatic disease throughout the left hemipelvis, including the entire left acetabulum. 3. Sclerotic osseous metastatic disease elsewhere throughout the right hemipelvis, sacrum, lower lumbar spine and bilateral proximal femora. 4. 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  Medical Consultants:  Radiation oncology  Other Consultants:  PT  IAnti-Infectives:  Rocephin 09/04/2015 -->   Leisa Lenz, MD  [facility] Pager [phone]  Time spent in minutes: 25 minutes  If 7PM-7AM, please contact night-coverage [email or web address] Password [facility] 09/05/2015, 4:58 PM   LOS: 4 days    HPI/Subjective: No acute overnight events. Patient reports left shoulder pain.   Objective: Filed  Vitals:   09/04/15 2111 09/05/15 0424 09/05/15 0940 09/05/15 1402  BP: 130/64 134/68 115/62 133/60  Pulse: 129 109 67 87  Temp: 98.2 F (36.8 C) 98.3 F (36.8 C)  97.9 F (36.6 C)  TempSrc: Oral Oral  Oral  Resp: 16 16 16    Height:      Weight:      SpO2: 100% 100% 100% 100%    Intake/Output Summary (Last 24 hours) at 09/05/15 1658 Last data filed at 09/05/15 1356  Gross per 24 hour  Intake    770 ml  Output   1200 ml  Net   -430 ml    Exam:   General:  Pt is alert, follows commands appropriately, not in acute distress  Cardiovascular: Regular rate and rhythm, S1/S2 appreciated   Respiratory: Clear to auscultation bilaterally, no wheezing, no crackles, no rhonchi  Abdomen: Soft, non tender, non distended, bowel sounds present  Extremities: trace pedal  edema, pulses palpable bilaterally  Neuro: Grossly nonfocal  Data Reviewed: Basic Metabolic Panel:  Recent Labs Lab 08/31/15 1952 09/01/15 0412 09/02/15 0338 09/03/15 0445 09/04/15 0455 09/05/15 0452  NA 137 137 141 138 138 138  K 4.6 4.6 4.8 5.1 4.8 4.7  CL 105 107 112* 107 108 107  CO2 21* 21* 19* 19* 21* 20*  GLUCOSE 238* 186* 226* 315* 186* 282*  BUN 26* 26* 31* 39* 45* 50*  CREATININE 1.16 1.13 1.04 0.95 1.00 0.94  CALCIUM 7.7* 7.4* 7.6* 8.1* 8.1* 7.8*  MG 1.8  --   --   --   --   --   PHOS 3.3  --   --   --   --   --    Liver Function Tests:  Recent Labs Lab 09/01/15 0412  AST 16  ALT 13*  ALKPHOS 314*  BILITOT 0.8  PROT 5.9*  ALBUMIN 1.5*   No results for input(s): LIPASE, AMYLASE in the last 168 hours. No results for input(s): AMMONIA in the last 168 hours. CBC:  Recent Labs Lab 08/31/15 1952 09/01/15 0412 09/02/15 0338 09/03/15 0445 09/04/15 0455 09/05/15 0452  WBC 6.5 7.0 7.0 7.2 6.6 6.1  NEUTROABS 5.5 5.8  --   --   --   --   HGB 8.9* 8.2* 7.4* 7.9* 8.2* 7.1*  HCT 28.3* 25.9* 23.7* 25.2* 25.9* 22.4*  MCV 91.3 90.9 92.6 92.0 91.8 91.1  PLT 102* 110* 89* 100* 107* 67*    Cardiac Enzymes:  Recent Labs Lab 08/31/15 1952  TROPONINI <0.03   BNP: Invalid input(s): POCBNP CBG:  Recent Labs Lab 09/04/15 1408 09/04/15 1727 09/04/15 2108 09/05/15 0808 09/05/15 1154  GLUCAP 236* 239* 238* 212* 222*    Results for orders placed or performed during the hospital encounter of 08/31/15  MRSA PCR Screening     Status: None   Collection Time: 09/01/15  6:32 PM  Result Value Ref Range Status   MRSA by PCR NEGATIVE NEGATIVE Final    Comment:        The GeneXpert MRSA Assay (FDA approved for NASAL specimens only), is one component of a comprehensive MRSA colonization surveillance program. It is not intended to diagnose MRSA  infection nor to guide or monitor treatment for MRSA infections.   Culture, Urine     Status: None   Collection Time: 09/03/15  8:59 PM  Result Value Ref Range Status   Specimen Description URINE, CLEAN CATCH  Final   Special Requests NONE  Final   Culture   Final    MULTIPLE SPECIES PRESENT, SUGGEST RECOLLECTION Performed at [facility]    Report Status 09/04/2015 FINAL  Final  Culture, Urine     Status: None (Preliminary result)   Collection Time: 09/04/15  3:54 PM  Result Value Ref Range Status   Specimen Description URINE, CLEAN CATCH  Final   Special Requests NONE  Final   Culture   Final    TOO YOUNG TO READ Performed at [facility]    Report Status PENDING  Incomplete  MRSA PCR Screening     Status: None   Collection Time: 09/04/15 10:51 PM  Result Value Ref Range Status   MRSA by PCR NEGATIVE NEGATIVE Final    Comment:        The GeneXpert MRSA Assay (FDA approved for NASAL specimens only), is one component of a comprehensive MRSA colonization surveillance program. It is not intended to diagnose MRSA infection nor to guide or monitor treatment for MRSA infections.      Scheduled Meds: . atorvastatin  10 mg Oral q1800  . cefTRIAXone   1 g Intravenous Q24H  . cholecalciferol   5,000 Units Oral Daily  . dexamethasone  4 mg Intravenous Q12H  . feeding supplement   237 mL Oral TID BM  . folic acid  1 mg Oral Daily  . furosemide  40 mg Oral Daily  . insulin aspart  0-15 Units Subcutaneous TID WC  . insulin aspart  3 Units Subcutaneous TID WC  . insulin detemir  8 Units Subcutaneous QHS  . metoprolol  5 mg Intravenous 4 times per day  . saccharomyces   250 mg Oral BID  . tamsulosin  0.4 mg Oral Daily  . vitamin B-12  1,000 mcg Oral Daily

## 2015-09-05 NOTE — Clinical Social Work Note (Signed)
Clinical Social Work Assessment  Patient Details  Name: Jeremy Jennings MRN: [id number] Date of Birth: 1935/12/30  Date of referral:  09/05/15               Reason for consult:  Facility Placement                Permission sought to share information with:    Permission granted to share information::     Name::     [name],[name] Daughter [phone] or [phone]  Agency::  SNF admissions  Relationship::     Contact Information:     Housing/Transportation Living arrangements for the past 2 months:  Single Family Home Source of Information:  Patient, Adult Children Patient Interpreter Needed:  None Criminal Activity/Legal Involvement Pertinent to Current Situation/Hospitalization:  No - Comment as needed Significant Relationships:  Adult Children Lives with:    Do you feel safe going back to the place where you live?  No (Patient and his daughter feel he needs some short term rehab) Need for family participation in patient care:  Yes (Comment)  Care giving concerns:  Patient and family feels he needs short term rehab before he can return back home.   Social Worker assessment / plan:  Patient is a 80 year old male who lives by himself.  Patient is alert and oriented x4 however he asked for CSW to speak to his daughter about SNF placement process.  Patient was just recently discharged from [facility], and would like to return back for his short term rehab per his daughter.  Patient's daughter was explained that he is still in his copay days, so he would have to start paying right away.  Patient's daughter expressed that if he can not go to [facility], she will look for a placement for him in [place] where she lives.  CSW informed patient's daughter that if he needs transport over 50 miles away it will have to be paid for by family patient's daughter expressed understanding.  Patient's daughter will look at SNFs in [place] area and inform CSW of where she would like him to  go if he is not accepted back to [facility].  Patient's daughter did not have any other questions.  Employment status:  Retired Nurse, adult PT Recommendations:  [place] / Referral to community resources:  [place]  Patient/Family's Response to care:  Patient and daughter agreeable to going to SNF for rehab.  Patient/Family's Understanding of and Emotional Response to Diagnosis, Current Treatment, and Prognosis:  Patient and family aware of current treatment plan and prognosis.  Emotional Assessment Appearance:  Appears stated age Attitude/Demeanor/Rapport:    Affect (typically observed):  Appropriate, Stable, Calm Orientation:  Oriented to Situation, Oriented to  Time, Oriented to Place, Oriented to Self Alcohol / Substance use:  Not Applicable Psych involvement (Current and /or in the community):  No (Comment)  Discharge Needs  Concerns to be addressed:    Readmission within the last 30 days:  Yes ([facility] on February 10) Current discharge risk:  Lack of support system Barriers to Discharge:  No Barriers Identified   Ross Ludwig, LCSWA 09/05/2015, 6:14 PM

## 2015-09-05 NOTE — Clinical Social Work Placement (Signed)
   CLINICAL SOCIAL WORK PLACEMENT  NOTE  Date:  09/05/2015  Patient Details  Name: Jeremy Jennings MRN: [id number] Date of Birth: Aug 12, 1935  Clinical Social Work is seeking post-discharge placement for this patient at the [place] level of care (*CSW will initial, date and re-position this form in  chart as items are completed):  Yes   Patient/family provided with [place] Work Department's list of facilities offering this level of care within the geographic area requested by the patient (or if unable, by the patient's family).  Yes   Patient/family informed of their freedom to choose among providers that offer the needed level of care, that participate in Medicare, Medicaid or managed care program needed by the patient, have an available bed and are willing to accept the patient.  Yes   Patient/family informed of [facility]'s ownership interest in [facility] and [facility], as well as of the fact that they are under no obligation to receive care at these facilities.  PASRR submitted to EDS on 09/05/15     PASRR number received on       Existing PASRR number confirmed on 09/05/15     FL2 transmitted to all facilities in geographic area requested by pt/family on 09/05/15     FL2 transmitted to all facilities within larger geographic area on       Patient informed that his/her managed care company has contracts with or will negotiate with certain facilities, including the following:            Patient/family informed of bed offers received.  Patient chooses bed at       Physician recommends and patient chooses bed at      Patient to be transferred to   on  .  Patient to be transferred to facility by       Patient family notified on   of transfer.  Name of family member notified:        PHYSICIAN Please sign FL2     Additional Comment:    _______________________________________________ Ross Ludwig, LCSWA 09/05/2015,  6:21 PM

## 2015-09-05 NOTE — Evaluation (Signed)
Physical Therapy Evaluation Patient Details Name: Jeremy Jennings MRN: [id number] DOB: 04/29/1936 Today's Date: 09/05/2015   History of Present Illness  Jeremy Jennings is a 80 y.o. male with a past medical history of metastatic to bone prostate cancer, hypertension, diastolic dysfunction (last EF was 70% 2 months ago post cardiac arrest), type 2 diabetes, hyperlipidemia . To ED 08/31/15  due to pain of the left  LE shows , xray shows clerotic osseous metastatic disease throughout the left  hemipelvis, including the entire left acetabulum  Clinical Impression  Noted left acetabular mets involved. Therapy needs WBS clarification on LLE.Marland Kitchen  Per RN who  had patient 2/23, patient appears  To be worsening in mobility and Left shoulder is very painful and dysfunctional due to pain.Patient required max assist of 2 persons  and much extra time for mobility to stand and pivot to recliner.    Pt admitted with above diagnosis. Pt currently with functional limitations due to the deficits listed below (see PT Problem List). Pt will benefit from skilled PT to increase their independence and safety with mobility to allow discharge to the venue listed below, although bony mets may limit patients tolerance to activity as  He appears to be getting less able to mobilize.        Follow Up Recommendations SNF;Supervision/Assistance - 24 hour    Equipment Recommendations  None recommended by PT    Recommendations for Other Services       Precautions / Restrictions Precautions Precautions: Fall Precaution Comments: severe pain and dysfunction L shoulder(new), pain LLE Restrictions Weight Bearing Restrictions: No Other Position/Activity Restrictions: no weight bearing restrictions orders. NOTE ACETABULAR METS INVOLVED. WILL ASK MD FOR WBS.      Mobility  Bed Mobility Overal bed mobility: Needs Assistance Bed Mobility: Supine to Sit     Supine to sit: HOB elevated;Max assist     General bed mobility  comments: assist  for  getting legs  over edge, increased pain on the L LE when knee flexed oveer the edge., assist with trunk  to upright using HOB and max assist . once in sitting, propped on the R UE. gradually able to take away support and brush teeth. Every aspects of mobility require exceptional amount of time.  Transfers Overall transfer level: Needs assistance Equipment used: 2 person hand held assist Transfers: Stand Pivot Transfers Sit to Stand: Max assist;+2 physical assistance;+2 safety/equipment;From elevated surface         General transfer comment: assist to rise with gait belt. unable to tolerate pressure on the L shoulder. exceptional time to perform activity. Patient propped on therapist shoulder and took small shuffle steps to  get to recliner. Decreased weight tolerated on the L leg.  Ambulation/Gait                Stairs            Wheelchair Mobility    Modified Rankin (Stroke Patients Only)       Balance Overall balance assessment: Needs assistance Sitting-balance support: Single extremity supported;Feet supported Sitting balance-Leahy Scale: Poor Sitting balance - Comments: props on R UE for balance mostly   Standing balance support: Single extremity supported;During functional activity Standing balance-Leahy Scale: Poor                               Pertinent Vitals/Pain Faces Pain Scale: Hurts worst Pain Location: left shoulder, left leg, toes both feet  Pain Descriptors / Indicators: Aching;Grimacing;Guarding;Heaviness;Pressure Pain Intervention(s): Heat applied    Home Living Family/patient expects to be discharged to:: Private residence Living Arrangements: Children Available Help at Discharge: Family;Available PRN/intermittently Type of Home: House Home Access: Stairs to enter   [employer] of Steps: 2 Home Layout: One level Home Equipment: Crutches;Cane - single point;Walker - 2 wheels;Shower seat -  built in Additional Comments: info gleaned from last admission as pt was unable to provide information.  No family present to clarify accuracy     Prior Function Level of Independence: Needs assistance   Gait / Transfers Assistance Needed: uses walker           Hand Dominance        Extremity/Trunk Assessment           LUE Deficits / Details: Pt indicates severe pain Lt shoulder.  Will not attempt to raise Lt UE.  When AAROM/PROM provided, pt cries out in pain.  He reports pain anterior aspect of shoulder.   Elbow distally appears WFL , needs assist to move the arm.   Lower Extremity Assessment: RLE deficits/detail;LLE deficits/detail RLE Deficits / Details: assist to move the leg to edge of bed, bears weight more than on the Left LLE Deficits / Details: required assist to move the leg. decreased ability to tolerate weight in standing antalgic  Cervical / Trunk Assessment: Other exceptions  Communication   Communication: No difficulties  Cognition Arousal/Alertness: Lethargic Behavior During Therapy: Flat affect Overall Cognitive Status: Difficult to assess     Current Attention Level: Focused   Following Commands: Follows one step commands consistently       General Comments: patient is unable to focus on questions related to orientationa and home due to c/o severe pain of the L shoulder    General Comments      Exercises        Assessment/Plan    PT Assessment Patient needs continued PT services  PT Diagnosis Difficulty walking;Acute pain   PT Problem List Decreased strength;Decreased range of motion;Decreased activity tolerance;Decreased balance;Decreased mobility;Decreased cognition;Pain;Decreased safety awareness  PT Treatment Interventions DME instruction;Gait training;Functional mobility training;Therapeutic activities;Therapeutic exercise;Patient/family education   PT Goals (Current goals can be found in the Care Plan section) Acute Rehab PT  Goals Patient Stated Goal: TO TRY TO GET UP PT Goal Formulation: With patient/family Time For Goal Achievement: 09/19/15 Potential to Achieve Goals: Fair    Frequency Min 3X/week   Barriers to discharge Decreased caregiver support      Co-evaluation               End of Session Equipment Utilized During Treatment: Gait belt Activity Tolerance: Patient limited by pain;Patient limited by lethargy Patient left: in chair;with call bell/phone within reach;with family/visitor present;with chair alarm set;with nursing/sitter in room Nurse Communication: Mobility status         Time: 0824-0910 PT Time Calculation (min) (ACUTE ONLY): 46 min   Charges:   PT Evaluation $PT Eval Moderate Complexity: 1 Procedure PT Treatments $Therapeutic Activity: 23-37 mins   PT G Codes:        Marcelino Freestone PT [id number]     09/05/2015, 9:43 AM

## 2015-09-05 NOTE — NC FL2 (Signed)
[place] LEVEL OF CARE SCREENING TOOL     IDENTIFICATION  Patient Name: Jeremy Jennings Birthdate: 12/29/1935 Sex: male Admission Date (Current Location): 08/31/2015  [facility] and [place] Number:  Herbalist and Address:  [facility],  [address], [place]      Provider Number: [id number]  Attending Physician Name and Address:  Robbie Lis, MD  Relative Name and Phone Number:  Valaria Good Daughter [phone] or [phone]    Current Level of Care: Hospital Recommended Level of Care: [place] Prior Approval Number:    Date Approved/Denied:   PASRR Number: [id number]  Discharge Plan: SNF    Current Diagnoses: Patient Active Problem List   Diagnosis Date Noted  . Bony metastasis ([place])   . Cancer related pain 09/02/2015  . Osseous metastasis ([place]) 09/02/2015  . Dyslipidemia associated with type 2 diabetes mellitus ([place]) 09/02/2015  . Accelerated hypertension 09/02/2015  . Anemia of chronic disease 09/02/2015  . Thrombocytopenia ([place]) 09/02/2015  . Protein-calorie malnutrition, severe ([place]) 09/02/2015  . Controlled diabetes mellitus without complication, with long-term current use of insulin ([place]) 09/02/2015  . Pressure ulcer 09/02/2015  . Chronic diastolic CHF (congestive heart failure) ([place]) 07/27/2015  . SBO (small bowel obstruction) ischemic s/p LOA & SB resection 06/12/2015 06/11/2015  . Prostate cancer ([place]) 11/20/2013  . Essential hypertension     Orientation RESPIRATION BLADDER Height & Weight     Self, Time, Situation, Place  Normal Incontinent Weight:  (unable, not on a weight bed and pt unable to stand for wt. ) Height:  5\' 11"  (180.3 cm)  BEHAVIORAL SYMPTOMS/MOOD NEUROLOGICAL BOWEL NUTRITION STATUS      Continent Diet (Carb Modified)  AMBULATORY STATUS COMMUNICATION OF NEEDS Skin   Limited Assist Verbally Normal                       Personal Care Assistance Level of  Assistance  Feeding, Dressing, Bathing Bathing Assistance: Limited assistance Feeding assistance: Limited assistance Dressing Assistance: Limited assistance     Functional Limitations Info  Sight, Hearing, Speech          SPECIAL CARE FACTORS FREQUENCY  PT (By licensed PT)     PT Frequency: 5x a week              Contractures      Additional Factors Info  Code Status, Allergies, Insulin Sliding Scale Code Status Info: Full Allergies Info: NKA   Insulin Sliding Scale Info: 3x  a day       Current Medications (09/05/2015):  This is the current hospital active medication list Current Facility-Administered Medications  Medication Dose Route Frequency Provider Last Rate Last Dose  . acetaminophen (TYLENOL) tablet 650 mg  650 mg Oral Q6H PRN Reubin Milan, MD   650 mg at 09/02/15 1038  . atorvastatin (LIPITOR) tablet 10 mg  10 mg Oral q1800 Reubin Milan, MD   10 mg at 09/05/15 1758  . brimonidine (ALPHAGAN) 0.2 % ophthalmic solution 1 drop  1 drop Right Eye Daily Robbie Lis, MD   1 drop at 09/05/15 0951  . cefTRIAXone (ROCEPHIN) 1 g in dextrose 5 % 50 mL IVPB  1 g Intravenous Q24H Theodis Blaze, MD   1 g at 09/05/15 0954  . cholecalciferol (VITAMIN D) tablet 5,000 Units  5,000 Units Oral Daily Reubin Milan, MD   5,000 Units at 09/05/15 [phone]  . dexamethasone (DECADRON) injection  4 mg  4 mg Intravenous Q12H Robbie Lis, MD   4 mg at 09/05/15 0947  . feeding supplement (ENSURE ENLIVE) (ENSURE ENLIVE) liquid 237 mL  237 mL Oral TID BM Robbie Lis, MD   237 mL at 09/05/15 0954  . folic acid (FOLVITE) tablet 1 mg  1 mg Oral Daily Reubin Milan, MD   1 mg at 09/05/15 0947  . furosemide (LASIX) tablet 40 mg  40 mg Oral Daily Robbie Lis, MD   40 mg at 09/05/15 0947  . HYDROmorphone (DILAUDID) injection 1 mg  1 mg Intravenous Q4H PRN Reubin Milan, MD   1 mg at 09/05/15 1406  . insulin aspart (novoLOG) injection 0-15 Units  0-15 Units Subcutaneous  TID WC Reubin Milan, MD   3 Units at 09/05/15 1759  . insulin aspart (novoLOG) injection 4 Units  4 Units Subcutaneous TID WC Robbie Lis, MD      . insulin detemir (LEVEMIR) injection 12 Units  12 Units Subcutaneous QHS Robbie Lis, MD      . metoprolol (LOPRESSOR) injection 10-20 mg  10-20 mg Intravenous Q8H PRN Theodis Blaze, MD      . metoprolol (LOPRESSOR) injection 5 mg  5 mg Intravenous 4 times per day Theodis Blaze, MD   5 mg at 09/05/15 1758  . ondansetron (ZOFRAN) tablet 4 mg  4 mg Oral Q6H PRN Reubin Milan, MD       Or  . ondansetron [facility]) injection 4 mg  4 mg Intravenous Q6H PRN Reubin Milan, MD      . prednisoLONE acetate (PRED FORTE) 1 % ophthalmic suspension 1 drop  1 drop Both Eyes BID Reubin Milan, MD   1 drop at 09/05/15 [phone]  . saccharomyces boulardii (FLORASTOR) capsule 250 mg  250 mg Oral BID Reubin Milan, MD   250 mg at 09/05/15 0946  . tamsulosin (FLOMAX) capsule 0.4 mg  0.4 mg Oral Daily Reubin Milan, MD   0.4 mg at 09/05/15 0946  . timolol (TIMOPTIC) 0.5 % ophthalmic solution 1 drop  1 drop Right Eye Daily Robbie Lis, MD   1 drop at 09/05/15 [phone]  . vitamin B-12 (CYANOCOBALAMIN) tablet 1,000 mcg  1,000 mcg Oral Daily Reubin Milan, MD   1,000 mcg at 09/05/15 [id number]     Discharge Medications: Please see discharge summary for a list of discharge medications.  Relevant Imaging Results:  Relevant Lab Results:   Additional Information SSN:  [id number]  Anell Barr

## 2015-09-06 LAB — CBC
HCT: 23.6 % — ABNORMAL LOW (ref 39.0–52.0)
HEMOGLOBIN: 7.6 g/dL — AB (ref 13.0–17.0)
MCH: 28.9 pg (ref 26.0–34.0)
MCHC: 32.2 g/dL (ref 30.0–36.0)
MCV: 89.7 fL (ref 78.0–100.0)
PLATELETS: 70 10*3/uL — AB (ref 150–400)
RBC: 2.63 MIL/uL — ABNORMAL LOW (ref 4.22–5.81)
RDW: 18 % — AB (ref 11.5–15.5)
WBC: 6.5 10*3/uL (ref 4.0–10.5)

## 2015-09-06 LAB — BASIC METABOLIC PANEL
Anion gap: 9 (ref 5–15)
BUN: 43 mg/dL — AB (ref 6–20)
CALCIUM: 7.6 mg/dL — AB (ref 8.9–10.3)
CHLORIDE: 105 mmol/L (ref 101–111)
CO2: 22 mmol/L (ref 22–32)
CREATININE: 0.86 mg/dL (ref 0.61–1.24)
GFR calc non Af Amer: >60 mL/min (ref >60)
Glucose, Bld: 279 mg/dL — ABNORMAL HIGH (ref 65–99)
Potassium: 4.4 mmol/L (ref 3.5–5.1)
SODIUM: 136 mmol/L (ref 135–145)

## 2015-09-06 LAB — GLUCOSE, CAPILLARY
GLUCOSE-CAPILLARY: 232 mg/dL — AB (ref 65–99)
GLUCOSE-CAPILLARY: 211 mg/dL — AB (ref 65–99)
Glucose-Capillary: 264 mg/dL — ABNORMAL HIGH (ref 65–99)

## 2015-09-06 MED ORDER — POLYETHYLENE GLYCOL 3350 17 G PO PACK
17.0000 g | PACK | Freq: Every day | ORAL | Status: DC
  Administered 2015-09-06 – 2015-09-08 (×3): 17 g via ORAL
  Filled 2015-09-06 (×3): qty 1

## 2015-09-06 NOTE — Progress Notes (Signed)
Assumed care of patient at 1500 from [place], [place].  I agree with her shift assessment, no changes noted.

## 2015-09-06 NOTE — Progress Notes (Signed)
CSW followed up with patient daughter regarding  Jeremy Jennings has not responded yet likely due to referral being sent on weekend.  Pt daughter did not have [employer] but CSW requested to send referral to all [place] facilities- dtr gave verbal permission.  CSW will continue to follow  Jeremy Jennings, [place] Social Worker [phone]

## 2015-09-06 NOTE — Progress Notes (Addendum)
Patient ID: RANKIN NOVEMBER, male   DOB: 03-25-1936, 80 y.o.   MRN: [id number] [facility] PROGRESS NOTE  TOCHUKWU BUTRON [id number] DOB: Nov 25, 1935 DOA: 08/31/2015 PCP: [name], MD  Brief narrative:    80 y.o. male with a past medical history of prostate cancer with bone metastases, hypertension, diastolic dysfunction (last EF was 60% in 06/2015), type 2 diabetes, hyperlipidemia with multiple hospitalizations most recently 2/10 - 08/22/15 for symptomatic anemia (prior admission 07/28/2015 for anemia, 06/2015 c.diff colitis and septic shock).  Pt presented with pain in left leg. Doppler study was negative for DVT. Plan fimls demonstrated sclerotic osseous metastatic disease in left hemipelvis including the left acetabulum and then throughout the right hemipelvis, sacrum, lower lumbar spine and bilateral femoral area.   In ED, he was tachycardic in 170's (sinus tachycardia) and SBP in 220's. He was given multiple doses of metoprolol IV 5 mg but his BP was still up and HR in 180's. We started labetalol drip and consulted cardiology. For metastatic lesions in the leg we started decadron and consulted radiation oncology for palliative radiation.   Assessment/Plan:    Principal Problem:  Intractable pain left lower extremity secondary to sclerotic bone metastases secondary to prostate cancer  - Pt had LE doppler on admission which was negative for DVT - No acute fractures on x rays - On ecadron 4 mg IV Q 12 hours  - Palliative rad to right and left hip and right sacrum started - pain is still uncontrolled - PCT consulted for further goals of care and assistance with pain management   Active Problems:   UTI - Started rocephin 2/24 - Multiple species on UCx none predominant - urine sample recollected     Left shoulder pain - still significant pain - PCT consulted for assistance with pain management    Accelerated hypertension / Sinus tachycardia  - reasonable inpatient  control     Dyslipidemia associated with type 2 diabetes mellitus (HCC) - Continue atorvastatin    Anemia of chronic disease - Secondary to malignancy  - Transfuse if hgb less than 7 - currently no indication for transfusion    Thrombocytopenia - Secondary to malignancy - Platelets 70 - No reports of bleeding    Controlled, type 2 diabetes mellitus without complication with long term insulin use (HCC) - Levemir to 12 units and novolog to 4 units TID - A1c was 6.3 about 3 months ago   Chronic diastolic CHF (congestive heart failure) ([place]) - Last 2 D ECHO in 06/2015 preserved EF - Compensated  - continue lasix    Protein-calorie malnutrition, severe ([place]) - In the context of chronic illness - Diet as tolerated   DVT Prophylaxis  - SCD's bilaterally due to thrombocytopenia   Code Status: Full.  Family Communication:  plan of care discussed with the patient and his daughter 2/25 Disposition Plan: home once pain better controlled, possibly by 2/28  IV access:  Peripheral IV  Procedures and diagnostic studies:    Dg Shoulder Left Port 09/05/2015  No acute abnormality.   Dg Hip Unilat With Pelvis 2-3 Views Left 08/31/2015 1. No acute osseous abnormality. 2. Sclerotic osseous metastatic disease throughout the left hemipelvis, including the entire left acetabulum. 3. Sclerotic osseous metastatic disease elsewhere throughout the right hemipelvis, sacrum, lower lumbar spine and bilateral proximal femora. 4. 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  Medical Consultants:  Radiation oncology PCM  Other Consultants:  PT  IAnti-Infectives:   Rocephin 09/04/2015 -->  [name], MD  [facility] Pager [phone]  Time spent in minutes: 25 minutes  If 7PM-7AM, please contact night-coverage [email or web address] Password [facility] 09/06/2015, 2:51 PM   LOS: 5 days    HPI/Subjective: No acute overnight events. Patient reports left shoulder pain  still 7/10.  Objective: Filed Vitals:   09/05/15 2108 09/06/15 0002 09/06/15 0433 09/06/15 1233  BP: 126/49 108/52 144/71 131/70  Pulse: 90 90 92 100  Temp: 97.6 F (36.4 C)  97.8 F (36.6 C)   TempSrc: Oral  Oral   Resp: 16  16 17   Height:      Weight:      SpO2: 100%  100% 100%    Intake/Output Summary (Last 24 hours) at 09/06/15 1451 Last data filed at 09/06/15 1400  Gross per 24 hour  Intake    470 ml  Output   1700 ml  Net  -1230 ml    Exam:   General:  Pt is alert, follows commands appropriately, not in acute distress, tired and weak appearing   Cardiovascular: Regular rate and rhythm, S1/S2 appreciated   Respiratory: Clear to auscultation bilaterally, no wheezing, no crackles, no rhonchi  Abdomen: Soft, non tender, non distended, bowel sounds present  Extremities: trace pedal  edema, pulses palpable bilaterally  Data Reviewed: Basic Metabolic Panel:  Recent Labs Lab 08/31/15 1952  09/02/15 0338 09/03/15 0445 09/04/15 0455 09/05/15 0452 09/06/15 0506  NA 137  < > 141 138 138 138 136  K 4.6  < > 4.8 5.1 4.8 4.7 4.4  CL 105  < > 112* 107 108 107 105  CO2 21*  < > 19* 19* 21* 20* 22  GLUCOSE 238*  < > 226* 315* 186* 282* 279*  BUN 26*  < > 31* 39* 45* 50* 43*  CREATININE 1.16  < > 1.04 0.95 1.00 0.94 0.86  CALCIUM 7.7*  < > 7.6* 8.1* 8.1* 7.8* 7.6*  MG 1.8  --   --   --   --   --   --   PHOS 3.3  --   --   --   --   --   --   < > = values in this interval not displayed. Liver Function Tests:  Recent Labs Lab 09/01/15 0412  AST 16  ALT 13*  ALKPHOS 314*  BILITOT 0.8  PROT 5.9*  ALBUMIN 1.5*   CBC:  Recent Labs Lab 08/31/15 1952 09/01/15 0412 09/02/15 0338 09/03/15 0445 09/04/15 0455 09/05/15 0452 09/06/15 0506  WBC 6.5 7.0 7.0 7.2 6.6 6.1 6.5  NEUTROABS 5.5 5.8  --   --   --   --   --   HGB 8.9* 8.2* 7.4* 7.9* 8.2* 7.1* 7.6*  HCT 28.3* 25.9* 23.7* 25.2* 25.9* 22.4* 23.6*  MCV 91.3 90.9 92.6 92.0 91.8 91.1 89.7  PLT 102* 110*  89* 100* 107* 67* 70*   Cardiac Enzymes:  Recent Labs Lab 08/31/15 1952  TROPONINI <0.03   BNP: Invalid input(s): POCBNP CBG:  Recent Labs Lab 09/05/15 1154 09/05/15 1719 09/05/15 2157 09/06/15 0733 09/06/15 1235  GLUCAP 222* 177* 199* 232* 211*    Results for orders placed or performed during the hospital encounter of 08/31/15  MRSA PCR Screening     Status: None   Collection Time: 09/01/15  6:32 PM  Result Value Ref Range Status   MRSA by PCR NEGATIVE NEGATIVE Final    Comment:        The GeneXpert MRSA Assay (FDA approved  for NASAL specimens only), is one component of a comprehensive MRSA colonization surveillance program. It is not intended to diagnose MRSA infection nor to guide or monitor treatment for MRSA infections.   Culture, Urine     Status: None   Collection Time: 09/03/15  8:59 PM  Result Value Ref Range Status   Specimen Description URINE, CLEAN CATCH  Final   Special Requests NONE  Final   Culture   Final    MULTIPLE SPECIES PRESENT, SUGGEST RECOLLECTION Performed at [facility]    Report Status 09/04/2015 FINAL  Final  Culture, Urine     Status: None (Preliminary result)   Collection Time: 09/04/15  3:54 PM  Result Value Ref Range Status   Specimen Description URINE, CLEAN CATCH  Final   Special Requests NONE  Final   Culture   Final    TOO YOUNG TO READ Performed at [facility]    Report Status PENDING  Incomplete  MRSA PCR Screening     Status: None   Collection Time: 09/04/15 10:51 PM  Result Value Ref Range Status   MRSA by PCR NEGATIVE NEGATIVE Final    Comment:        The GeneXpert MRSA Assay (FDA approved for NASAL specimens only), is one component of a comprehensive MRSA colonization surveillance program. It is not intended to diagnose MRSA infection nor to guide or monitor treatment for MRSA infections.      Scheduled Meds: . atorvastatin  10 mg Oral q1800  . cefTRIAXone   1 g Intravenous Q24H  .  cholecalciferol  5,000 Units Oral Daily  . dexamethasone  4 mg Intravenous Q12H  . feeding supplement   237 mL Oral TID BM  . folic acid  1 mg Oral Daily  . furosemide  40 mg Oral Daily  . insulin aspart  0-15 Units Subcutaneous TID WC  . insulin aspart  3 Units Subcutaneous TID WC  . insulin detemir  8 Units Subcutaneous QHS  . metoprolol  5 mg Intravenous 4 times per day  . saccharomyces   250 mg Oral BID  . tamsulosin  0.4 mg Oral Daily  . vitamin B-12  1,000 mcg Oral Daily

## 2015-09-07 ENCOUNTER — Telehealth: Payer: Self-pay | Admitting: *Deleted

## 2015-09-07 ENCOUNTER — Ambulatory Visit
Admit: 2015-09-07 | Discharge: 2015-09-07 | Disposition: A | Discharge status: Home / Self Care | Payer: Medicare Other | Attending: Radiation Oncology | Admitting: Radiation Oncology

## 2015-09-07 DIAGNOSIS — B9620 Unspecified Escherichia coli [E. coli] as the cause of diseases classified elsewhere: Secondary | ICD-10-CM

## 2015-09-07 DIAGNOSIS — N390 Urinary tract infection, site not specified: Secondary | ICD-10-CM

## 2015-09-07 DIAGNOSIS — Z515 Encounter for palliative care: Secondary | ICD-10-CM

## 2015-09-07 LAB — CBC
HCT: 22.5 % — ABNORMAL LOW (ref 39.0–52.0)
HEMOGLOBIN: 7.3 g/dL — AB (ref 13.0–17.0)
MCH: 29.3 pg (ref 26.0–34.0)
MCHC: 32.4 g/dL (ref 30.0–36.0)
MCV: 90.4 fL (ref 78.0–100.0)
Platelets: 66 10*3/uL — ABNORMAL LOW (ref 150–400)
RBC: 2.49 MIL/uL — ABNORMAL LOW (ref 4.22–5.81)
RDW: 18.2 % — ABNORMAL HIGH (ref 11.5–15.5)
WBC: 8 10*3/uL (ref 4.0–10.5)

## 2015-09-07 LAB — BASIC METABOLIC PANEL
Anion gap: 11 (ref 5–15)
BUN: 41 mg/dL — AB (ref 6–20)
CALCIUM: 7.9 mg/dL — AB (ref 8.9–10.3)
CO2: 21 mmol/L — AB (ref 22–32)
CREATININE: 0.78 mg/dL (ref 0.61–1.24)
Chloride: 104 mmol/L (ref 101–111)
GFR calc Af Amer: >60 mL/min (ref >60)
GLUCOSE: 369 mg/dL — AB (ref 65–99)
Potassium: 4.6 mmol/L (ref 3.5–5.1)
SODIUM: 136 mmol/L (ref 135–145)

## 2015-09-07 LAB — URINE CULTURE

## 2015-09-07 LAB — GLUCOSE, CAPILLARY
GLUCOSE-CAPILLARY: 226 mg/dL — AB (ref 65–99)
GLUCOSE-CAPILLARY: 215 mg/dL — AB (ref 65–99)
Glucose-Capillary: 313 mg/dL — ABNORMAL HIGH (ref 65–99)
Glucose-Capillary: 244 mg/dL — ABNORMAL HIGH (ref 65–99)

## 2015-09-07 MED ORDER — SENNOSIDES-DOCUSATE SODIUM 8.6-50 MG PO TABS
1.0000 | ORAL_TABLET | Freq: Two times a day (BID) | ORAL | Status: DC
  Administered 2015-09-07 – 2015-09-08 (×2): 1 via ORAL
  Filled 2015-09-07 (×2): qty 1

## 2015-09-07 MED ORDER — VITAMINS A & D EX OINT
TOPICAL_OINTMENT | CUTANEOUS | Status: AC
  Administered 2015-09-07: 16:00:00
  Filled 2015-09-07: qty 5

## 2015-09-07 MED ORDER — POLYETHYLENE GLYCOL 3350 17 G PO PACK: 17.0000 g | PACK | Freq: Every day | ORAL | Status: DC

## 2015-09-07 MED ORDER — OXYCODONE HCL 5 MG PO TABS
10.0000 mg | ORAL_TABLET | ORAL | Status: DC | PRN
  Administered 2015-09-07 – 2015-09-08 (×5): 10 mg via ORAL
  Filled 2015-09-07 (×5): qty 2

## 2015-09-07 MED ORDER — HYDROMORPHONE HCL 1 MG/ML IJ SOLN
0.5000 mg | INTRAMUSCULAR | Status: DC | PRN
  Filled 2015-09-07: qty 1

## 2015-09-07 MED ORDER — CIPROFLOXACIN IN D5W 400 MG/200ML IV SOLN
400.0000 mg | Freq: Two times a day (BID) | INTRAVENOUS | Status: DC
  Administered 2015-09-08 (×2): 400 mg via INTRAVENOUS
  Filled 2015-09-07 (×2): qty 200

## 2015-09-07 MED ORDER — INSULIN DETEMIR 100 UNIT/ML ~~LOC~~ SOLN
18.0000 [IU] | Freq: Every day | SUBCUTANEOUS | Status: DC
  Administered 2015-09-07: 18 [IU] via SUBCUTANEOUS
  Filled 2015-09-07 (×2): qty 0.18

## 2015-09-07 MED ORDER — INSULIN ASPART 100 UNIT/ML ~~LOC~~ SOLN
6.0000 [IU] | Freq: Three times a day (TID) | SUBCUTANEOUS | Status: DC
  Administered 2015-09-08: 6 [IU] via SUBCUTANEOUS

## 2015-09-07 NOTE — Progress Notes (Signed)
Inpatient Diabetes Program Recommendations  AACE/ADA: New Consensus Statement on Inpatient Glycemic Control (2015)  Target Ranges:  Prepandial:   less than 140 mg/dL      Peak postprandial:   less than 180 mg/dL (1-2 hours)      Critically ill patients:  140 - 180 mg/dL   Results for JAXXEN, KORTAN (MRN [id number]) as of 09/07/2015 11:07  Ref. Range 09/06/2015 07:33 09/06/2015 12:35 09/06/2015 18:15 09/06/2015 22:57  Glucose-Capillary Latest Ref Range: 65-99 mg/dL 232 (H) 211 (H) 264 (H) 226 (H)   Results for ALCUS, SCIARRETTA (MRN [id number]) as of 09/07/2015 11:07  Ref. Range 09/07/2015 08:00  Glucose-Capillary Latest Ref Range: 65-99 mg/dL 313 (H)    Home DM Meds: Levemir 6 units QHS       Novolog SSI  Current Insulin Orders: Levemir 12 units QHS      Novolog Moderate Correction Scale/ SSI (0-15 units) TID AC      Novolog 4 units tidwc    -Patient currently receiving Decadron 4 mg bid.  -Sustained elevated glucose levels noted.    MD- Please consider the following in-hospital insulin adjustments while patient getting IV Decadron:  1. Increase Levemir to 15 units QHS  2. Increase Novolog Correction Scale/ SSI to Resistant scale (0-20 units) TID AC + HS     --Will follow patient during hospitalization--  Wyn Quaker RN, MSN, CDE Diabetes Coordinator Inpatient Glycemic Control Team Team Pager: [phone] (8a-5p)

## 2015-09-07 NOTE — Consult Note (Signed)
Consultation Note Date: 09/07/2015   Patient Name: Jeremy Jennings  DOB: 02-16-1936  MRN: [id number]  Age / Sex: 80 y.o., male  PCP: Darlin Coco, MD Referring Physician: Robbie Lis, MD  Reason for Consultation: Establishing goals of care, symptom management  Jeremy Jennings is a very pleasant 80 yo gentleman with stage 4 prostate cancer, recurrent hospitalizations for anemia, h/o PEA arrest in December 2016 requiring brief intubation, h/o SBO laparotomy, diastolic CHF. Presents with pain r/t osseous mets. Currently receiving palliative radiation beginning 09/04/15.   Clinical Assessment/Narrative: Discussion with Jeremy Jennings today. He was fairly comfortable in discussing EOL and recognizes "this is only going to get worse" was what he told me. He does not want to suffer. He does not feel like he has suffered greatly at this point and is hopeful for further treatment. He tells me that he cared for his wife with scleroderma and feeding tube, extremely debilitated for 12 yrs and she died ~5-6 yrs ago so he has seen suffering. He has discussed EOL and has made funeral arrangements with his daughter. He is not afraid to die but hopeful to have some more time and get back to some of the activities he enjoys. 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 We did discuss that his care is always his decision in what makes sense for him and discussed more comfort path so he knows his options.   1) Full code (recognizes that this may not make sense in the future) 2) Hopeful for further treatment with Dr. Alen Blew 3) Recommend OxyIR 10 mg every 4 hours prn. Decrease Dilaudid IV to 0.5 mg every 4 hours prn to be used if OxyIR ineffective. Will  reassess for need to increase OxyIR to 15 mg every 4 hours tomorrow.  4) LBM 2/22. Will order scheduled Miralax daily and scheduled Senokot-S BID for now. May decrease if needed but will likely need with increased opioid needs.    Contacts/Participants in Discussion: Primary Decision Maker: Self   Relationship to Patient daughter is next but not present today for discussion   Code Status/Advance Care Planning: Full code    Code Status Orders        Start     Ordered   09/01/15 0213  Full code   Continuous     09/01/15 0212    Code Status History    Date Active Date Inactive Code Status Order ID Comments User Context   08/21/2015  4:05 PM 08/23/2015 12:05 PM Full Code [id number]  Bonnielee Haff, MD Inpatient   07/28/2015  6:11 PM 07/29/2015  3:11 PM Full Code [id number]  Loletha Grayer, MD Inpatient   07/05/2015  5:29 AM 07/17/2015  6:12 PM Full Code [id number]  Luella Cook, MD ED   06/11/2015  6:10 PM 07/03/2015  6:16 PM Full Code [id number]  Saverio Danker, PA-C Inpatient   05/27/2015  6:22 PM 05/30/2015  3:32 PM DNR [id number]  Florencia Reasons, MD Inpatient   05/27/2015  5:32 PM 05/27/2015  6:22 PM DNR [id number]  Florencia Reasons, MD ED   11/09/2014  4:03 PM 11/11/2014  2:33 PM Full Code [id number]  Robbie Lis, MD Inpatient       Symptom Management:   Pain r/t osseous mets bilat hips: Continue radiation therapy. Continue steroids.  Trial of OxyIR 10 mg every 4 hours prn (may require increase to 15 mg - will assess tomorrow). Decrease Dilaudid IV every 4 hours prn if po ineffective. Could also consider adding low dose Lyrica 25 mg daily.   Left shoulder pain? He says this is more of a chronic pain: Utilize OxyIR as well.   Constipation?: LBM 2/22   Palliative Prophylaxis:   Bowel Regimen and Frequent Pain Assessment   Psycho-social/Spiritual:  Support System: Adequate Desire for further Chaplaincy support:yes Additional Recommendations: Caregiving  Support/Resources  Prognosis: Unable to  determine - unfortunately poor with multiple complications delaying treatment/chemo.   Discharge Planning: [facility] for rehab with Palliative care service follow-up   Chief Complaint/ Primary Diagnoses: Present on Admission:  . Essential hypertension . Prostate cancer ([place]) . Chronic diastolic CHF (congestive heart failure) ([place]) . Cancer related pain . Osseous metastasis ([place]) . Dyslipidemia associated with type 2 diabetes mellitus ([place]) . Accelerated hypertension . Anemia of chronic disease . Thrombocytopenia ([place]) . Protein-calorie malnutrition, severe ([place])  I have reviewed the medical record, interviewed the patient and family, and examined the patient. The following aspects are pertinent.  History reviewed. No pertinent past medical history. Social History   Social History  . Marital Status: Widowed    Spouse Name: N/A  . Number of Children: N/A  . Years of Education: N/A   Occupational History  . Retired    Social History Main Topics  . Smoking status: Former Smoker -- 0.25 packs/day for 20 years    Types: Cigarettes    Quit date: 07/12/1979  . Smokeless tobacco: Never Used  . Alcohol Use: No  . Drug Use: No  . Sexual Activity: Not Currently   Other Topics Concern  . None   Social History Narrative   Family History  Problem Relation Age of Onset  . Cancer Neg Hx   . Heart attack Neg Hx     unknown  . Stroke Neg Hx     unknown  . Kidney failure Brother   . Kidney failure Sister   . Diabetes Mother   . Hypertension Father    Scheduled Meds: . atorvastatin  10 mg Oral q1800  . brimonidine  1 drop Right Eye Daily  . cefTRIAXone (ROCEPHIN)  IV  1 g Intravenous Q24H  . cholecalciferol  5,000 Units Oral Daily  . dexamethasone  4 mg Intravenous Q12H  . feeding supplement (ENSURE ENLIVE)  237 mL Oral TID BM  . folic acid  1 mg Oral Daily  . furosemide  40 mg Oral Daily  . insulin aspart  0-15 Units Subcutaneous TID WC  . insulin aspart   6 Units Subcutaneous TID WC  . insulin detemir  18 Units Subcutaneous QHS  . metoprolol  5 mg Intravenous 4 times per day  . polyethylene glycol  17 g Oral Daily  . prednisoLONE acetate  1 drop Both Eyes BID  . saccharomyces boulardii  250 mg Oral BID  . tamsulosin  0.4 mg Oral Daily  . timolol  1 drop Right Eye Daily  . vitamin B-12  1,000 mcg Oral Daily   Continuous Infusions:  PRN Meds:.acetaminophen, HYDROmorphone (DILAUDID) injection, metoprolol, ondansetron **OR** ondansetron (ZOFRAN) IV Medications Prior to Admission:  Prior to Admission medications   Medication Sig Start Date End Date Taking? Authorizing Provider  acetaminophen (TYLENOL) 325 MG tablet Take 650 mg by mouth every 6 (six) hours as needed for mild pain or moderate pain.   Yes Historical  Provider, MD  atorvastatin (LIPITOR) 10 MG tablet Take 10 mg by mouth daily.   Yes Historical Provider, MD  Cholecalciferol (VITAMIN D3) 5000 units TABS Take 5,000 Units by mouth daily.   Yes Historical Provider, MD  COMBIGAN 0.2-0.5 % ophthalmic solution Place 1 drop into the right eye daily.    Yes Historical Provider, MD  feeding supplement, ENSURE ENLIVE, (ENSURE ENLIVE) LIQD Take 237 mLs by mouth 2 (two) times daily between meals. 07/17/15  Yes Modena Jansky, MD  folic acid (FOLVITE) 1 MG tablet Take 1 mg by mouth daily.   Yes Historical Provider, MD  furosemide (LASIX) 40 MG tablet Take 40 mg by mouth daily.    Yes Historical Provider, MD  insulin aspart (NOVOLOG) 100 UNIT/ML injection Inject 0-9 Units into the skin 3 (three) times daily with meals as needed for high blood sugar. Pt uses per sliding scale:    70-150:  0 units  151-200:  1 unit  201-250:  2 units  251-300:  3 units  301-350:  5 units  351-400:  7 units  401-500:  9 units and notify MD   Yes Historical Provider, MD  insulin detemir (LEVEMIR) 100 UNIT/ML injection Inject 6 Units into the skin at bedtime.   Yes Historical Provider, MD  megestrol (MEGACE) 20 MG  tablet Take 20 mg by mouth 2 (two) times daily. Takes along with 200mg  of liquid for a total of 220mg  at one time   Yes Historical Provider, MD  megestrol (MEGACE) 400 MG/10ML suspension Take 10 mLs (400 mg total) by mouth 2 (two) times daily. Patient taking differently: Take 200 mg by mouth 2 (two) times daily. Takes along with 20mg  tablet for a total of 220mg  at one time 05/18/15  Yes Wyatt Portela, MD  metoprolol (LOPRESSOR) 50 MG tablet Take 1 tablet (50 mg total) by mouth 2 (two) times daily. 07/27/15  Yes Dayna N Dunn, PA-C  ondansetron (ZOFRAN) 4 MG tablet Take 4 mg by mouth every 8 (eight) hours as needed for nausea or vomiting. As directed 07/03/15  Yes Historical Provider, MD  prednisoLONE acetate (PRED FORTE) 1 % ophthalmic suspension Place 1 drop into both eyes 2 (two) times daily.  04/25/13  Yes Historical Provider, MD  saccharomyces boulardii (FLORASTOR) 250 MG capsule Take 1 capsule (250 mg total) by mouth 2 (two) times daily. 07/17/15  Yes Modena Jansky, MD  tamsulosin (FLOMAX) 0.4 MG CAPS capsule Take 0.4 mg by mouth daily.    Yes Historical Provider, MD  vitamin B-12 (CYANOCOBALAMIN) 1000 MCG tablet Take 1,000 mcg by mouth daily.   Yes Historical Provider, MD   No Known Allergies  Review of Systems  Constitutional: Positive for activity change, appetite change and fatigue.  Neurological: Positive for weakness.    Physical Exam  Constitutional: He is oriented to person, place, and time. He appears well-developed.  HENT:  Head: Normocephalic and atraumatic.  Cardiovascular: Normal rate.   Respiratory: Effort normal. No accessory muscle usage. No tachypnea. No respiratory distress.  GI: Soft. Normal appearance. He exhibits no distension.  Neurological: He is alert and oriented to person, place, and time.  Psychiatric: He has a normal mood and affect.    Vital Signs: BP 134/65 mmHg  Pulse 93  Temp(Src) 97.5 F (36.4 C) (Oral)  Resp 18  Ht 5\' 11"  (1.803 m)  Wt 74.707 kg  (164 lb 11.2 oz)  BMI 22.98 kg/m2  SpO2 100%  SpO2: SpO2: 100 % O2 Device:SpO2: 100 %  O2 Flow Rate: .O2 Flow Rate (L/min): 0 L/min  IO: Intake/output summary:  Intake/Output Summary (Last 24 hours) at 09/07/15 1303 Last data filed at 09/07/15 1024  Gross per 24 hour  Intake    890 ml  Output   1800 ml  Net   -910 ml    LBM: Last BM Date: 09/02/15 Baseline Weight: Weight: 74.3 kg (163 lb 12.8 oz) Most recent weight: Weight:  (unable to stand)      Palliative Assessment/Data:  Flowsheet Rows        Most Recent Value   Intake Tab    Referral Department  Hospitalist   Unit at Time of Referral  Cardiac/Telemetry Unit   Palliative Care Primary Diagnosis  Cancer   Date Notified  09/06/15   Palliative Care Type  New Palliative care   Reason for referral  Clarify Goals of Care   Date of Admission  08/31/15   Date first seen by Palliative Care  09/07/15   # of days Palliative referral response time  1 Day(s)   # of days IP prior to Palliative referral  6   Clinical Assessment    Psychosocial & Spiritual Assessment    Palliative Care Outcomes       Additional Data Reviewed:  CBC:    Component Value Date/Time   WBC 8.0 09/07/2015 0438   WBC 6.0 05/18/2015 0838   HGB 7.3* 09/07/2015 0438   HGB 7.2* 05/18/2015 0838   HCT 22.5* 09/07/2015 0438   HCT 22.2* 05/18/2015 0838   PLT 66* 09/07/2015 0438   PLT 389 05/18/2015 0838   MCV 90.4 09/07/2015 0438   MCV 86.3 05/18/2015 0838   NEUTROABS 5.8 09/01/2015 0412   NEUTROABS 4.6 05/18/2015 0838   LYMPHSABS 0.8 09/01/2015 0412   LYMPHSABS 0.8* 05/18/2015 0838   MONOABS 0.4 09/01/2015 0412   MONOABS 0.5 05/18/2015 0838   EOSABS 0.0 09/01/2015 0412   EOSABS 0.1 05/18/2015 0838   BASOSABS 0.0 09/01/2015 0412   BASOSABS 0.0 05/18/2015 0838   Comprehensive Metabolic Panel:    Component Value Date/Time   NA 136 09/07/2015 0438   NA 142 05/18/2015 0838   K 4.6 09/07/2015 0438   K 3.5 05/18/2015 0838   CL 104 09/07/2015  0438   CO2 21* 09/07/2015 0438   CO2 18* 05/18/2015 0838   BUN 41* 09/07/2015 0438   BUN 18.3 05/18/2015 0838   CREATININE 0.78 09/07/2015 0438   CREATININE 1.08 08/20/2015 1607   CREATININE 0.8 05/18/2015 0838   GLUCOSE 369* 09/07/2015 0438   GLUCOSE 84 05/18/2015 0838   CALCIUM 7.9* 09/07/2015 0438   CALCIUM 6.5* 07/15/2015 0945   CALCIUM 8.4 05/18/2015 0838   AST 16 09/01/2015 0412   AST 18 05/18/2015 0838   ALT 13* 09/01/2015 0412   ALT <9 05/18/2015 0838   ALKPHOS 314* 09/01/2015 0412   ALKPHOS 488* 05/18/2015 0838   BILITOT 0.8 09/01/2015 0412   BILITOT 0.33 05/18/2015 0838   PROT 5.9* 09/01/2015 0412   PROT 6.5 05/18/2015 0838   ALBUMIN 1.5* 09/01/2015 0412   ALBUMIN 2.4* 05/18/2015 0838     Time In: 1100 Time Out: 1200 Time Total: 28min Greater than 50%  of this time was spent counseling and coordinating care related to the above assessment and plan.  Signed by: Pershing Proud, NP  Pershing Proud, NP  [id number], 1:03 PM  Please contact Palliative Medicine Team phone at [phone] for questions and concerns.

## 2015-09-07 NOTE — Telephone Encounter (Signed)
Was called back to [facility] room, patient condom cath  Was off the patient when transferring him from his bed to the table, his bed linen was wet, I placed the foley in  A plastic bage and covered in  A black plaxtic bag, called the floor and poke with Elzie Rings his RN , patient is being treated  With radaition , Rn thanked this RN for the call 12:44 PM

## 2015-09-07 NOTE — Progress Notes (Addendum)
Palliative:  Full note to follow. Discussion with Mr. Dechene today. He was fairly comfortable in discussing EOL and recognizes "this is only going to get worse" was what he told me. He does not want to suffer. He does not feel like he has suffered greatly at this point and is hopeful for further treatment. We did discuss that his care is always his decision in what makes sense for him and discussed more comfort path so he knows his options.   1) Full code (recognizes that this may not make sense in the future) 2) Hopeful for further treatment with Dr. Alen Blew 3) Recommend OxyIR 10 mg every 4 hours prn. Decrease Dilaudid IV to 0.5 mg every 4 hours prn to be used if OxyIR ineffective. Will reassess for need to increase OxyIR to 15 mg every 4 hours tomorrow.  4) LBM 2/22. Will order scheduled Miralax daily and scheduled Senokot-S BID for now. May decrease if needed but will likely need with increased opioid needs.   Vinie Sill, NP Palliative Medicine Team Pager # [phone] (M-F 8a-5p) Team Phone # [phone] (Nights/Weekends)

## 2015-09-07 NOTE — Progress Notes (Signed)
Pharmacy Antibiotic Note  Jeremy Jennings is a 80 y.o. male admitted on 08/31/2015 with UTI.  Pharmacy has been consulted for Cipro dosing.  Plan: Cipro 400mg  IV q12h  Height: 5\' 11"  (180.3 cm) Weight:  (unable to stand) IBW/kg (Calculated) : 75.3  Temp (24hrs), Avg:97.8 F (36.6 C), Min:97.5 F (36.4 C), Max:98.2 F (36.8 C)   Recent Labs Lab 09/03/15 0445 09/04/15 0455 09/05/15 0452 09/06/15 0506 09/07/15 0438  WBC 7.2 6.6 6.1 6.5 8.0  CREATININE 0.95 1.00 0.94 0.86 0.78    Estimated Creatinine Clearance: 81.7 mL/min (by C-G formula based on Cr of 0.78).    No Known Allergies  Antimicrobials this admission: 2/24 rocephin >> 2/27 2/17 cipro >>   Dose adjustments this admission:   Microbiology results:  BCx:   UCx:    Sputum:    MRSA PCR:   Thank you for allowing pharmacy to be a part of this patient's care.  Dorrene German 09/07/2015 10:30 PM

## 2015-09-07 NOTE — Progress Notes (Signed)
Patient ID: Jeremy Jennings, male   DOB: [birth year], 80 y.o.   MRN: [id number] [facility] PROGRESS NOTE  Jeremy Jennings [id number] DOB: 06/27/1936 DOA: 08/31/2015 PCP: [name], MD  Brief narrative:    80 y.o. male with a past medical history of prostate cancer with bone metastases, hypertension, diastolic dysfunction (last EF was 60% in 06/2015), type 2 diabetes, hyperlipidemia with multiple hospitalizations most recently 2/10 - 08/22/15 for symptomatic anemia (prior admission 07/28/2015 for anemia, 06/2015 c.diff colitis and septic shock).  Pt presented with pain in left leg. Doppler study was negative for DVT. Plan fimls demonstrated sclerotic osseous metastatic disease in left hemipelvis including the left acetabulum and then throughout the right hemipelvis, sacrum, lower lumbar spine and bilateral femoral area.   In ED, he was tachycardic in 170's (sinus tachycardia) and SBP in 220's. He was given multiple doses of metoprolol IV 5 mg but his BP was still up and HR in 180's. We started labetalol drip and consulted cardiology. For metastatic lesions in the leg we started decadron and consulted radiation oncology for palliative radiation.   Assessment/Plan:    Principal Problem:  Intractable pain left lower extremity secondary to sclerotic bone metastases secondary to prostate cancer  - Pt had LE doppler on admission which was negative for DVT - Pain currently relatively controlled - He is on Decadron 4 mg IV every 12 hours - Has started palliative radiotherapy to right and left hip and right sacrum - Appreciate palliative care help with goals of care - PT has seen the patient in consultation, recommendation for skilled nursing facility placement  Active Problems:  UTI secondary to enterococcus and Escherichia coli - Continue Rocephin - As noted, urine culture grew enterococcus and Escherichia coli   Left shoulder pain - No acute findings on x-ray of the left  shoulder, no evidence of metastasis  - Pain better this morning   Accelerated hypertension / Sinus tachycardia  - Continue Lasix and metoprolol    Dyslipidemia associated with type 2 diabetes mellitus ([place]) - Continue atorvastatin    Anemia of chronic disease - Secondary to malignancy  - Hemoglobin stable at 7.3 - Transfuse if hgb less than 7   Thrombocytopenia - Secondary to malignancy - Platelets are 66 this morning - No reports of bleeding   Controlled, type 2 diabetes mellitus without complication with long term insulin use ([facility]) - A1c was 6.3 about 3 months ago - Patient currently takes Levemir 12 units and novolog to 4 units TID - CBGs in past 24 hours: 264, 226, 313 - It would be reasonable to increase Levemir to 18 units and NovoLog to  units 3 times daily   Chronic diastolic CHF (congestive heart failure) (H[facility]) - Last 2 D ECHO in 06/2015 preserved EF - Compensated  - Stable respiratory status - Continue Lasix 40 mg daily   Protein-calorie malnutrition, severe ([facility]) - In the context of chronic illness - Tolerates regular diet   DVT Prophylaxis  - Continue SCD's bilaterally due to thrombocytopenia   Code Status: Full.  Family Communication:  plan of care discussed with the patient Disposition Plan: to SNF or home 2/28  IV access:  Peripheral IV  Procedures and diagnostic studies:    Dg Shoulder Left Port 09/05/2015 No acute abnormality.   Dg Hip Unilat With Pelvis 2-3 Views Left 08/31/2015 1. No acute osseous abnormality. 2. Sclerotic osseous metastatic disease throughout the left hemipelvis, including the entire left acetabulum. 3. Sclerotic osseous metastatic disease elsewhere  throughout the right hemipelvis, sacrum, lower lumbar spine and bilateral proximal femora. 4. 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  Medical Consultants:  Radiation oncology PCM  Other Consultants:  Nutrition PT  IAnti-Infectives:   Rocephin  09/04/2015 -->   Leisa Lenz, MD  [facility] Pager [phone]  Time spent in minutes: 25 minutes  If 7PM-7AM, please contact night-coverage [email or web address] Password [facility] 09/07/2015, 11:53 AM   LOS: 6 days    HPI/Subjective: No acute overnight events. Patient reports pain in shoulder is better.   Objective: Filed Vitals:   09/06/15 1500 09/06/15 1832 09/06/15 2029 09/07/15 0446  BP: 126/55 130/63 124/58 134/65  Pulse: 100  88 93  Temp: 97.9 F (36.6 C)  98.1 F (36.7 C) 97.5 F (36.4 C)  TempSrc: Oral  Oral Oral  Resp: 18  16 18   Height:      Weight:      SpO2: 100%  100% 100%    Intake/Output Summary (Last 24 hours) at 09/07/15 1153 Last data filed at 09/07/15 1024  Gross per 24 hour  Intake    890 ml  Output   1800 ml  Net   -910 ml    Exam:   General:  Pt is alert, not in acute distress  Cardiovascular: Regular rate and rhythm, S1/S2 (+)   Respiratory: No wheezing, no crackles, no rhonchi  Abdomen: Soft, non tender, non distended, bowel sounds present  Extremities: No edema, pulses palpable bilaterally  Neuro: Grossly nonfocal  Data Reviewed: Basic Metabolic Panel:  Recent Labs Lab 08/31/15 1952  09/03/15 0445 09/04/15 0455 09/05/15 0452 09/06/15 0506 09/07/15 0438  NA 137  < > 138 138 138 136 136  K 4.6  < > 5.1 4.8 4.7 4.4 4.6  CL 105  < > 107 108 107 105 104  CO2 21*  < > 19* 21* 20* 22 21*  GLUCOSE 238*  < > 315* 186* 282* 279* 369*  BUN 26*  < > 39* 45* 50* 43* 41*  CREATININE 1.16  < > 0.95 1.00 0.94 0.86 0.78  CALCIUM 7.7*  < > 8.1* 8.1* 7.8* 7.6* 7.9*  MG 1.8  --   --   --   --   --   --   PHOS 3.3  --   --   --   --   --   --   < > = values in this interval not displayed. Liver Function Tests:  Recent Labs Lab 09/01/15 0412  AST 16  ALT 13*  ALKPHOS 314*  BILITOT 0.8  PROT 5.9*  ALBUMIN 1.5*   No results for input(s): LIPASE, AMYLASE in the last 168 hours. No results for input(s): AMMONIA in the last 168  hours. CBC:  Recent Labs Lab 08/31/15 1952 09/01/15 0412  09/03/15 0445 09/04/15 0455 09/05/15 0452 09/06/15 0506 09/07/15 0438  WBC 6.5 7.0  < > 7.2 6.6 6.1 6.5 8.0  NEUTROABS 5.5 5.8  --   --   --   --   --   --   HGB 8.9* 8.2*  < > 7.9* 8.2* 7.1* 7.6* 7.3*  HCT 28.3* 25.9*  < > 25.2* 25.9* 22.4* 23.6* 22.5*  MCV 91.3 90.9  < > 92.0 91.8 91.1 89.7 90.4  PLT 102* 110*  < > 100* 107* 67* 70* 66*  < > = values in this interval not displayed. Cardiac Enzymes:  Recent Labs Lab 08/31/15 1952  TROPONINI <0.03   BNP: Invalid input(s): POCBNP  CBG:  Recent Labs Lab 09/06/15 0733 09/06/15 1235 09/06/15 1815 09/06/15 2257 09/07/15 0800  GLUCAP 232* 211* 264* 226* 313*    MRSA PCR Screening     Status: None   Collection Time: 09/01/15  6:32 PM  Result Value Ref Range Status   MRSA by PCR NEGATIVE NEGATIVE Final  Culture, Urine     Status: None   Collection Time: 09/03/15  8:59 PM  Result Value Ref Range Status   Specimen Description URINE, CLEAN CATCH  Final   Special Requests NONE  Final   Culture   Final    MULTIPLE SPECIES PRESENT, SUGGEST RECOLLECTION Performed at [facility]    Report Status 09/04/2015 FINAL  Final  Culture, Urine     Status: None   Collection Time: 09/04/15  3:54 PM  Result Value Ref Range Status   Specimen Description URINE, CLEAN CATCH  Final   Special Requests NONE  Final   Culture   Final    >=100,000 COLONIES/mL ENTEROCOCCUS SPECIES 60,000 COLONIES/ml ESCHERICHIA COLI    Report Status 09/07/2015 FINAL  Final   Organism ID, Bacteria ENTEROCOCCUS SPECIES  Final   Organism ID, Bacteria ESCHERICHIA COLI  Final      Susceptibility   Escherichia coli - MIC*    AMPICILLIN >=32 RESISTANT Resistant     CEFAZOLIN <=4 SENSITIVE Sensitive     CEFTRIAXONE <=1 SENSITIVE Sensitive     CIPROFLOXACIN <=0.25 SENSITIVE Sensitive     GENTAMICIN <=1 SENSITIVE Sensitive     IMIPENEM <=0.25 SENSITIVE Sensitive     NITROFURANTOIN <=16  SENSITIVE Sensitive     TRIMETH/SULFA >=320 RESISTANT Resistant     AMPICILLIN/SULBACTAM 16 INTERMEDIATE Intermediate     PIP/TAZO <=4 SENSITIVE Sensitive     * 60,000 COLONIES/ml ESCHERICHIA COLI   Enterococcus species - MIC*    AMPICILLIN <=2 SENSITIVE Sensitive     LEVOFLOXACIN 1 SENSITIVE Sensitive     NITROFURANTOIN <=16 SENSITIVE Sensitive     VANCOMYCIN 1 SENSITIVE Sensitive     * >=100,000 COLONIES/mL ENTEROCOCCUS SPECIES  MRSA PCR Screening     Status: None   Collection Time: 09/04/15 10:51 PM  Result Value Ref Range Status   MRSA by PCR NEGATIVE NEGATIVE Final     Scheduled Meds: . atorvastatin  10 mg Oral q1800  . cefTRIAXone (ROCEPHIN)  IV  1 g Intravenous Q24H  . cholecalciferol  5,000 Units Oral Daily  . dexamethasone  4 mg Intravenous Q12H  . feeding supplement (ENSURE ENLIVE)  237 mL Oral TID BM  . folic acid  1 mg Oral Daily  . furosemide  40 mg Oral Daily  . insulin aspart  0-15 Units Subcutaneous TID WC  . insulin aspart  4 Units Subcutaneous TID WC  . insulin detemir  12 Units Subcutaneous QHS  . metoprolol  5 mg Intravenous 4 times per day  . polyethylene glycol  17 g Oral Daily  . saccharomyces   250 mg Oral BID  . tamsulosin  0.4 mg Oral Daily  . vitamin B-12  1,000 mcg Oral Daily

## 2015-09-07 NOTE — Progress Notes (Signed)
Nutrition Follow-up  DOCUMENTATION CODES:   Severe malnutrition in context of chronic illness  INTERVENTION:  - Continue Ensure TID - RD will continue to monitor for needs  NUTRITION DIAGNOSIS:   Malnutrition related to chronic illness as evidenced by percent weight loss, severe depletion of body fat, severe depletion of muscle mass. -ongoing  GOAL:   Patient will meet greater than or equal to 90% of their needs -met on average with meals and supplements  MONITOR:   PO intake, Supplement acceptance, Labs, Weight trends, I & O's  ASSESSMENT:   80 y.o. male with a past medical history of prostate cancer with bone metastases, hypertension, diastolic dysfunction (last EF was 60% in 06/2015), type 2 diabetes, hyperlipidemia with multiple hospitalizations most recently 2/10 - 08/22/15 for symptomatic anemia (prior admission 07/28/2015 for anemia, 06/2015 c.diff colitis and septic shock).  2/27 Per chart review, pt ate 100% of breakfast and lunch yesterday (2/26) and 50% of breakfast this AM which was brought in by family. Pt reports that breakfast was orange juice, coffee, and sausage biscuit. Pt denies any swallowing issues and denies abdominal pain or nausea this AM. He states that his appetite continues to be good and that he has been drinking Ensure; RN in pt's room and confirms pt has been drinking supplements. Will keep Ensure order rather than switching to Glucerna despite CBGs given difference in kcal and double the amount of protein in Ensure versus Glucerna.  Pt meeting needs on average with meals and supplements. Medications reviewed; Megace order no longer in place. Labs reviewed; CBGs: 150-313 mg/dL, BUN: 41 mg/dL, Ca: 7.9 mg/dL.    2/22 - Pt in room with no family present at bedside.  - Pt reports having eaten 1/2 [place] sandwich with 100% of his Ensure supplement for lunch.  - Pt reports continuing to take Megace.  - States he has been eating PTA.  - Denies any swallowing  or chewing problems.  - States that he has noticed a new cough has developed after he eats, states it is not painful.  - Patient tries to eat small more frequent meals, declines daily snacks at this time.  - RD to increase Ensure order to TID.  - Per weight history, pt has lost 26 lb since 1/05 (14% weight loss x 2 months, significant for time frame). - Nutrition-Focused physical exam completed. Findings are severe fat depletion, severe muscle depletion, and no edema.   Diet Order:  Diet heart healthy/carb modified Room service appropriate?: Yes; Fluid consistency:: Thin  Skin:  Reviewed, no issues  Last BM:  2/22  Height:   Ht Readings from Last 1 Encounters:  09/01/15 5' 11" (1.803 m)    Weight:   Wt Readings from Last 1 Encounters:  08/23/15 159 lb 6.4 oz (72.303 kg)    Ideal Body Weight:  78.2 kg  BMI:  Body mass index is 22.98 kg/(m^2).  Estimated Nutritional Needs:   Kcal:  2100-2300  Protein:  110-120g  Fluid:  2.2L/day  EDUCATION NEEDS:   No education needs identified at this time     Jarome Matin, RD, LDN Inpatient Clinical Dietitian Pager # [phone] After hours/weekend pager # [phone]

## 2015-09-08 ENCOUNTER — Ambulatory Visit
Admit: 2015-09-08 | Discharge: 2015-09-08 | Disposition: A | Discharge status: Home / Self Care | Payer: Medicare Other | Attending: Radiation Oncology | Admitting: Radiation Oncology

## 2015-09-08 LAB — BASIC METABOLIC PANEL
Anion gap: 12 (ref 5–15)
BUN: 36 mg/dL — ABNORMAL HIGH (ref 6–20)
CALCIUM: 8 mg/dL — AB (ref 8.9–10.3)
CHLORIDE: 103 mmol/L (ref 101–111)
CO2: 22 mmol/L (ref 22–32)
Creatinine, Ser: 0.89 mg/dL (ref 0.61–1.24)
GFR calc Af Amer: >60 mL/min (ref >60)
GLUCOSE: 167 mg/dL — AB (ref 65–99)
POTASSIUM: 4.7 mmol/L (ref 3.5–5.1)
Sodium: 137 mmol/L (ref 135–145)

## 2015-09-08 LAB — GLUCOSE, CAPILLARY
GLUCOSE-CAPILLARY: 271 mg/dL — AB (ref 65–99)
Glucose-Capillary: 259 mg/dL — ABNORMAL HIGH (ref 65–99)
Glucose-Capillary: 156 mg/dL — ABNORMAL HIGH (ref 65–99)
Glucose-Capillary: 140 mg/dL — ABNORMAL HIGH (ref 65–99)

## 2015-09-08 LAB — CBC
HCT: 25.5 % — ABNORMAL LOW (ref 39.0–52.0)
HEMOGLOBIN: 8 g/dL — AB (ref 13.0–17.0)
MCH: 28.5 pg (ref 26.0–34.0)
MCHC: 31.4 g/dL (ref 30.0–36.0)
MCV: 90.7 fL (ref 78.0–100.0)
Platelets: 78 10*3/uL — ABNORMAL LOW (ref 150–400)
RBC: 2.81 MIL/uL — AB (ref 4.22–5.81)
RDW: 17.9 % — ABNORMAL HIGH (ref 11.5–15.5)
WBC: 9.5 10*3/uL (ref 4.0–10.5)

## 2015-09-08 MED ORDER — SENNOSIDES-DOCUSATE SODIUM 8.6-50 MG PO TABS: 1.0000 | ORAL_TABLET | Freq: Two times a day (BID) | ORAL | Status: AC

## 2015-09-08 MED ORDER — INSULIN ASPART 100 UNIT/ML ~~LOC~~ SOLN: 6.0000 [IU] | Freq: Three times a day (TID) | SUBCUTANEOUS | Status: AC

## 2015-09-08 MED ORDER — CIPROFLOXACIN HCL 500 MG PO TABS: 500.0000 mg | ORAL_TABLET | Freq: Two times a day (BID) | ORAL | Status: AC

## 2015-09-08 MED ORDER — DEXAMETHASONE 4 MG PO TABS: 4.0000 mg | ORAL_TABLET | Freq: Every day | ORAL | Status: AC

## 2015-09-08 MED ORDER — INSULIN DETEMIR 100 UNIT/ML ~~LOC~~ SOLN: 18.0000 [IU] | Freq: Every day | SUBCUTANEOUS | Status: AC

## 2015-09-08 MED ORDER — OXYCODONE HCL 10 MG PO TABS: 10.0000 mg | ORAL_TABLET | ORAL | Status: AC | PRN

## 2015-09-08 NOTE — Clinical Social Work Placement (Signed)
Patient is set to discharge to [facility] today. Patient & daughter, [name] aware. Discharge packet given to RN, Elzie Rings.   [facility] will be called for transport once room at [facility] is ready - Doug at [facility] to call CSW when ready.     Raynaldo Opitz, [facility] Clinical Social Worker cell #: [phone]    CLINICAL SOCIAL WORK PLACEMENT  NOTE  Date:  09/08/2015  Patient Details  Name: Jeremy Jennings MRN: [id number] Date of Birth: 1936-04-24  Clinical Social Work is seeking post-discharge placement for this patient at the [place] level of care (*CSW will initial, date and re-position this form in  chart as items are completed):  Yes   Patient/family provided with [facility] Work Department's list of facilities offering this level of care within the geographic area requested by the patient (or if unable, by the patient's family).  Yes   Patient/family informed of their freedom to choose among providers that offer the needed level of care, that participate in Medicare, Medicaid or managed care program needed by the patient, have an available bed and are willing to accept the patient.  Yes   Patient/family informed of [facility]'s ownership interest in [facility] and [facility], as well as of the fact that they are under no obligation to receive care at these facilities.  PASRR submitted to EDS on 09/05/15     PASRR number received on       Existing PASRR number confirmed on 09/05/15     FL2 transmitted to all facilities in geographic area requested by pt/family on 09/05/15     FL2 transmitted to all facilities within larger geographic area on       Patient informed that his/her managed care company has contracts with or will negotiate with certain facilities, including the following:        Yes   Patient/family informed of bed offers received.  Patient chooses bed at [facility]     Physician recommends and patient chooses bed at      Patient to be transferred to [facility] on 09/08/15.  Patient to be transferred to facility by [facility]     Patient family notified on 09/08/15 of transfer.  Name of family member notified:  patient's daughter, [name]     PHYSICIAN       Additional Comment:    _______________________________________________ Standley Brooking, LCSW 09/08/2015, 11:24 AM

## 2015-09-08 NOTE — Discharge Instructions (Signed)
Bone Metastasis Bone metastasis is cancer that spreads to the bones from another part of the body. A person may have bone metastasis in one bone or in more than one bone. Cancer that spreads to the bones is different from cancer that starts in the bones (primary bone cancer). Bone metastasis is more common than primary bone cancer. The spine is the most common area for bone metastasis. Other common areas include:  Hip bone (pelvis).  Ribs.  Skull.  Long bones of the arm or leg. Bone metastasis is painful, and it damages the bones. Bone metastasis damages and weakens bones in two ways. A person may have bone destruction (osteolytic damage) or abnormal bone growth (osteoblastic destruction). Both of these conditions can make bones so weak that they break (pathologic fracture) even from a minor injury. CAUSES This condition is caused by cancer cells that spread to bone. These cells can get into your bloodstream and spread through your body. They can also get into the vessels that are part of your lymphatic system (lymph vessels) and spread that way. RISK FACTORS This condition is more likely to develop in people who have an advanced type of cancer that is known to spread to bone. Cancers that often spread to bone include:  Breast cancer.  Prostate cancer.  Lung cancer.  Thyroid cancer.  Kidney cancer. SYMPTOMS The most common symptom of this condition is bone pain, especially while you are resting. Other symptoms include:  A broken bone (fracture) that happens with little or no trauma.  Low number of red blood cells (anemia). Bone destruction may damage the spongy tissue (bone marrow) in the center of some bones where red blood cells are produced. Anemia can cause:  Weakness.  Shortness of breath.  Headache.  Dizziness.  Back or neck pain with numbness or weakness, especially if you have bone metastasis in your spine.  High levels of calcium in your blood (hypercalcemia). When  bone is destroyed, calcium is released into your blood. Symptoms of hypercalcemia include:  Constipation.  Thirst.  Nausea.  Sleepiness. DIAGNOSIS This condition may be diagnosed based on:  Your symptoms and medical history. Your health care provider may suspect this condition if you are being treated for cancer or have had cancer treatment in the past.  A physical exam.  Imaging studies, such as:  Bone X-rays, especially in the area where you have pain.  CT scan.  Bone scan.  MRI.  Blood tests to check for anemia or hypercalcemia.  A procedure to remove a piece of bone so it can be examined under a microscope (biopsy). TREATMENT Treatment for this condition depends on your overall health, the type of cancer you have, and how much the cancer has spread. You will work with a team of health care providers to determine which treatment is best for you. Treatment will focus on managing pain, preventing bone weakness, and slowing the spread of the cancer. Treatment may include:  Radiation therapy. This treatment uses X-rays to kill cancer cells. It is most effective for reducing pain, stopping tumor growth, and lowering the risk of fractures.  Radioisotope therapy. This treatment uses a radioactive medicine that is injected into your blood. The medicine travels to areas where cancer cells are active and kills them.  Chemotherapy. For this treatment, you are given cancer-killing drugs. You may have chemotherapy in cycles, with rest periods in between.  Medicines to block cells that destroy bone (bisphosphonates and denosumab). These medicines are used to control bone  is injected into your blood. The medicine travels to areas where cancer cells are active and kills them.  · Chemotherapy. For this treatment, you are given cancer-killing drugs. You may have chemotherapy in cycles, with rest periods in between.  · Medicines to block cells that destroy bone (bisphosphonates and denosumab). These medicines are used to control bone pain. They may help to reduce hypercalcemia.  · Medicines to reduce pain (opiates).  · Endocrine therapies. These therapies slow cancer growth by blocking specific chemical messengers (hormones). Some types of cancer, including breast and prostate cancers, depend on hormones.  · Targeted therapies. These therapies involve  the use of drugs that block the growth and spread of cancer. This treatment is different from standard chemotherapy.  · Immunotherapies. These therapies use the body's defense system (immune system) to fight cancer cells.  · Surgery. You may have surgery to remove bone cancer or to prevent or repair a fracture.  HOME CARE INSTRUCTIONS  · Take medicines only as directed by your health care provider.  · Do not drive or operate heavy machinery while taking pain medicine.  · Take the following steps to help prevent constipation, which can result from some pain medicines.    Include plenty of fruits and whole grains in your diet.    Drink enough fluid to keep your urine clear or pale yellow.    Ask your health care provider about taking a laxative if needed.  · Keep all follow-up visits as directed by your health care provider. This is important.  SEEK MEDICAL CARE IF:  · Your pain medicine is not helping.  · You are too weak to care for yourself at home.  SEEK IMMEDIATE MEDICAL CARE IF:  · You fall and have pain or an injury.  · You have trouble walking.  · You have numbness or tingling in your legs.  · Your pain suddenly gets worse.  · You lose control of your bowels or your bladder.  · You are very sleepy or confused.     This information is not intended to replace advice given to you by your health care provider. Make sure you discuss any questions you have with your health care provider.     Document Released: 06/17/2002 Document Revised: 11/11/2014 Document Reviewed: 04/30/2014  Elsevier Interactive Patient Education ©2016 [employer]

## 2015-09-08 NOTE — Progress Notes (Signed)
Report called to Levada Dy, Therapist, sports at [facility]. All questions answered. Contact number provided.

## 2015-09-08 NOTE — Progress Notes (Signed)
I agree with the previous nurses assessemnt

## 2015-09-08 NOTE — Progress Notes (Signed)
[facility] called for transport.     Jabar Krysiak, LCSW [facility] Clinical Social Worker cell #: [phone]  

## 2015-09-08 NOTE — Care Management Important Message (Signed)
Important Message  Patient Details  Name: Jeremy Jennings MRN: [id number] Date of Birth: 1936-03-17   Medicare Important Message Given:  Yes    Camillo Flaming 09/08/2015, 12:54 [place] Message  Patient Details  Name: Jeremy Jennings MRN: [id number] Date of Birth: March 17, 1936   Medicare Important Message Given:  Yes    Camillo Flaming 09/08/2015, 12:54 PM

## 2015-09-08 NOTE — Discharge Summary (Signed)
Physician Discharge Summary  Jeremy Jennings [id number] DOB: [birth year] DOA: 08/31/2015  PCP: [name], MD  Admit date: 08/31/2015 Discharge date: 09/08/2015  Recommendations for Outpatient Follow-up:  1. Continue Cipro for 7 days on discharge for urinary tract infection 2. Continue Decadron 4 mg once daily on discharge for pain control and bone metastasis 3. Continue to address goals of care with palliative care services in skilled nursing facility  Discharge Diagnoses:  Principal Problem:   Cancer related pain Active Problems:   Osseous metastasis (HCC)   Accelerated hypertension   Essential hypertension   Prostate cancer (HCC)   Chronic diastolic CHF (congestive heart failure) ([place])   Dyslipidemia associated with type 2 diabetes mellitus (HCC)   Anemia of chronic disease   Thrombocytopenia ([place])   Protein-calorie malnutrition, severe (HCC)   Controlled diabetes mellitus without complication, with long-term current use of insulin (HCC)   Pressure ulcer   Bone metastasis ([place])   Palliative care encounter    Discharge Condition: stable   Diet recommendation: as tolerated   History of present illness:  Per brief narrative "80 y.o. male with a past medical history of prostate cancer with bone metastases, hypertension, diastolic dysfunction (last EF was 60% in 06/2015), type 2 diabetes, hyperlipidemia with multiple hospitalizations most recently 2/10 - 08/22/15 for symptomatic anemia (prior admission 07/28/2015 for anemia, 06/2015 c.diff colitis and septic shock).  Pt presented with pain in left leg. Doppler study was negative for DVT. Plan fimls demonstrated sclerotic osseous metastatic disease in left hemipelvis including the left acetabulum and then throughout the right hemipelvis, sacrum, lower lumbar spine and bilateral femoral area.   In ED, he was tachycardic in 170's (sinus tachycardia) and SBP in 220's. He was given multiple doses of metoprolol IV 5 mg but his BP  was still up and HR in 180's. We started labetalol drip and consulted cardiology. For metastatic lesions in the leg we started decadron and consulted radiation oncology for palliative radiation."  Hospital Course:   Assessment/Plan:    Principal Problem:  Intractable pain left lower extremity secondary to sclerotic bone metastases secondary to prostate cancer  - Pt had LE doppler on admission which was negative for DVT - Continue Decadron on discharge 4 mg once a day - Has started palliative radiotherapy to right and left hip and right sacrum - Continue palliative care services in skilled nursing facility   Active Problems:  UTI secondary to enterococcus and Escherichia coli - As noted, urine culture grew enterococcus and Escherichia coli - Continue Cipro for 7 days on discharge twice daily   Left shoulder pain - No acute findings on x-ray of the left shoulder, no evidence of metastasis  - Continue pain management efforts   Accelerated hypertension / Sinus tachycardia  - Continue Lasix and metoprolol    Dyslipidemia associated with type 2 diabetes mellitus (HCC) - Continue atorvastatin on discharge   Anemia of chronic disease - Secondary to malignancy  - Hemoglobin stable at 8   Thrombocytopenia - Secondary to malignancy - Platelets 78 this morning   Controlled, type 2 diabetes mellitus without complication with long term insulin use (HCC) - A1c was 6.3 about 3 months ago - Since he will continue decadron continue Levemir 18 units and NovoLog 6 units 3 times daily in addition to SSI   Chronic diastolic CHF (congestive heart failure) (HCC) - Last 2 D ECHO in 06/2015 preserved EF - Compensated  - Continue Lasix 40 mg daily on discharge    Protein-calorie  malnutrition, severe ([place]) - In the context of chronic illness - Tolerates regular diet   DVT Prophylaxis  - SCD's bilaterally due to thrombocytopenia   Code Status: Full.  Family Communication:  plan of care discussed with the patient   IV access:  Peripheral IV  Procedures and diagnostic studies:   Dg Shoulder Left Port Sep 30, 2015 No acute abnormality.   Dg Hip Unilat With Pelvis 2-3 Views Left 08/31/2015 1. No acute osseous abnormality. 2. Sclerotic osseous metastatic disease throughout the left hemipelvis, including the entire left acetabulum. 3. Sclerotic osseous metastatic disease elsewhere throughout the right hemipelvis, sacrum, lower lumbar spine and bilateral proximal femora. 4. 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  Medical Consultants:  Radiation oncology PCM  Other Consultants:  Nutrition PT IAnti-Infectives:   Rocephin 09/04/2015 --> 09/08/2015    Signed:  Leisa Lenz, MD  [facility] 09/08/2015, 11:09 AM  Pager #: [phone]  Time spent in minutes: more than 30 minutes  Discharge Exam: Filed Vitals:   09/08/15 0001 09/08/15 0606  BP: 153/68 151/74  Pulse:  93  Temp:  97.5 F (36.4 C)  Resp:  16   Filed Vitals:   09/07/15 1751 09/07/15 2136 09/08/15 0001 09/08/15 0606  BP: 142/62 140/75 153/68 151/74  Pulse: 105 97  93  Temp:  98.2 F (36.8 C)  97.5 F (36.4 C)  TempSrc:  Oral  Oral  Resp: 20 20  16   Height:      Weight:      SpO2: 100% 100%  100%    General: Pt is alert, follows commands appropriately, not in acute distress Cardiovascular: Regular rate and rhythm, S1/S2 +, no murmurs Respiratory: Clear to auscultation bilaterally, no wheezing, no crackles, no rhonchi Abdominal: Soft, non tender, non distended, bowel sounds +, no guarding Extremities: no cyanosis, pulses palpable bilaterally DP and PT Neuro: Grossly nonfocal  Discharge Instructions  Discharge Instructions    Call MD for:  difficulty breathing, headache or visual disturbances    Complete by:  As directed      Call MD for:  persistant dizziness or light-headedness    Complete by:  As directed      Call MD for:  persistant  nausea and vomiting    Complete by:  As directed      Call MD for:  severe uncontrolled pain    Complete by:  As directed      Diet - low sodium heart healthy    Complete by:  As directed      Discharge instructions    Complete by:  As directed   1. Continue Cipro for 7 days on discharge for urinary tract infection 2. Continue Decadron 4 mg once daily on discharge for pain control and bone metastasis 3. Continue to address goals of care with palliative care services in skilled nursing facility     Increase activity slowly    Complete by:  As directed             Medication List    TAKE these medications        acetaminophen 325 MG tablet  Commonly known as:  TYLENOL  Take 650 mg by mouth every 6 (six) hours as needed for mild pain or moderate pain.     atorvastatin 10 MG tablet  Commonly known as:  LIPITOR  Take 10 mg by mouth daily.     ciprofloxacin 500 MG tablet  Commonly known as:  CIPRO  Take 1 tablet (500  mg total) by mouth 2 (two) times daily.     COMBIGAN 0.2-0.5 % ophthalmic solution  Generic drug:  brimonidine-timolol  Place 1 drop into the right eye daily.     dexamethasone 4 MG tablet  Commonly known as:  DECADRON  Take 1 tablet (4 mg total) by mouth daily.     feeding supplement (ENSURE ENLIVE) Liqd  Take 237 mLs by mouth 2 (two) times daily between meals.     folic acid 1 MG tablet  Commonly known as:  FOLVITE  Take 1 mg by mouth daily.     furosemide 40 MG tablet  Commonly known as:  LASIX  Take 40 mg by mouth daily.     insulin aspart 100 UNIT/ML injection  Commonly known as:  novoLOG  Inject 0-9 Units into the skin 3 (three) times daily with meals as needed for high blood sugar. Pt uses per sliding scale:    70-150:  0 units  151-200:  1 unit  201-250:  2 units  251-300:  3 units  301-350:  5 units  351-400:  7 units  401-500:  9 units and notify MD     insulin aspart 100 UNIT/ML injection  Commonly known as:  novoLOG  Inject 6 Units into the  skin 3 (three) times daily with meals.     insulin detemir 100 UNIT/ML injection  Commonly known as:  LEVEMIR  Inject 0.18 mLs (18 Units total) into the skin at bedtime.     megestrol 20 MG tablet  Commonly known as:  MEGACE  Take 20 mg by mouth 2 (two) times daily. Takes along with 200mg  of liquid for a total of 220mg  at one time     metoprolol 50 MG tablet  Commonly known as:  LOPRESSOR  Take 1 tablet (50 mg total) by mouth 2 (two) times daily.     ondansetron 4 MG tablet  Commonly known as:  ZOFRAN  Take 4 mg by mouth every 8 (eight) hours as needed for nausea or vomiting. As directed     Oxycodone HCl 10 MG Tabs  Take 1 tablet (10 mg total) by mouth every 4 (four) hours as needed for moderate pain.     prednisoLONE acetate 1 % ophthalmic suspension  Commonly known as:  PRED FORTE  Place 1 drop into both eyes 2 (two) times daily.     saccharomyces boulardii 250 MG capsule  Commonly known as:  FLORASTOR  Take 1 capsule (250 mg total) by mouth 2 (two) times daily.     senna-docusate 8.6-50 MG tablet  Commonly known as:  Senokot-S  Take 1 tablet by mouth 2 (two) times daily.     tamsulosin 0.4 MG Caps capsule  Commonly known as:  FLOMAX  Take 0.4 mg by mouth daily.     vitamin B-12 1000 MCG tablet  Commonly known as:  CYANOCOBALAMIN  Take 1,000 mcg by mouth daily.     Vitamin D3 5000 units Tabs  Take 5,000 Units by mouth daily.           Follow-up Information    Follow up with [name], MD. Schedule an appointment as soon as possible for a visit today.   Specialty:  Cardiology   Why:  Follow up appt after recent hospitalization   Contact information:   [address] [place] [address] [phone]        The results of significant diagnostics from this hospitalization (including imaging, microbiology, ancillary  and laboratory) are listed below for reference.    Significant Diagnostic Studies: Dg Shoulder Left Port  09/05/2015   CLINICAL DATA:  Pain without trauma EXAM: LEFT SHOULDER - 1 VIEW COMPARISON:  None FINDINGS: The study is limited due to only 2 images and lack of external rotation. However, within this limitation, no fracture or dislocation is identified. IMPRESSION: No acute abnormality. Electronically Signed   By: Dorise Bullion III M.D   On: 09/05/2015 14:42   Dg Hip Unilat With Pelvis 2-3 Views Left  08/31/2015  CLINICAL DATA:  80 year old with current history of metastatic prostate cancer, presenting with acute onset left hip pain. No recent injuries. EXAM: DG HIP (WITH OR WITHOUT PELVIS) 2-3V LEFT COMPARISON:  None. FINDINGS: No evidence of acute or subacute fracture or dislocation. Sclerotic osseous metastatic disease involving much of the left hemipelvis, including the entire left acetabulum. Mild to moderate axial joint space narrowing. Sclerotic osseous metastases elsewhere in the right side of the pelvis, the sacrum, the proximal femora, and the visualized lower lumbar spine. Sacroiliac joints and symphysis pubis intact. Symmetric mild to moderate axial joint space narrowing in the contralateral right hip. IMPRESSION: 1. No acute osseous abnormality. 2. Sclerotic osseous metastatic disease throughout the left hemipelvis, including the entire left acetabulum. 3. Sclerotic osseous metastatic disease elsewhere throughout the right hemipelvis, sacrum, lower lumbar spine and bilateral proximal femora. 4. 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 Electronically Signed   By: Evangeline Dakin M.D.   On: 08/31/2015 19:57    Microbiology: Recent Results (from the past 240 hour(s))  MRSA PCR Screening     Status: None   Collection Time: 09/01/15  6:32 PM  Result Value Ref Range Status   MRSA by PCR NEGATIVE NEGATIVE Final    Comment:        The GeneXpert MRSA Assay (FDA approved for NASAL specimens only), is one component of a comprehensive MRSA colonization surveillance program. It is not intended  to diagnose MRSA infection nor to guide or monitor treatment for MRSA infections.   Culture, Urine     Status: None   Collection Time: 09/03/15  8:59 PM  Result Value Ref Range Status   Specimen Description URINE, CLEAN CATCH  Final   Special Requests NONE  Final   Culture   Final    MULTIPLE SPECIES PRESENT, SUGGEST RECOLLECTION Performed at [facility]    Report Status 09/04/2015 FINAL  Final  Culture, Urine     Status: None   Collection Time: 09/04/15  3:54 PM  Result Value Ref Range Status   Specimen Description URINE, CLEAN CATCH  Final   Special Requests NONE  Final   Culture   Final    >=100,000 COLONIES/mL ENTEROCOCCUS SPECIES 60,000 COLONIES/ml ESCHERICHIA COLI Performed at [facility]    Report Status 09/07/2015 FINAL  Final   Organism ID, Bacteria ENTEROCOCCUS SPECIES  Final   Organism ID, Bacteria ESCHERICHIA COLI  Final      Susceptibility   Escherichia coli - MIC*    AMPICILLIN >=32 RESISTANT Resistant     CEFAZOLIN <=4 SENSITIVE Sensitive     CEFTRIAXONE <=1 SENSITIVE Sensitive     CIPROFLOXACIN <=0.25 SENSITIVE Sensitive     GENTAMICIN <=1 SENSITIVE Sensitive     IMIPENEM <=0.25 SENSITIVE Sensitive     NITROFURANTOIN <=16 SENSITIVE Sensitive     TRIMETH/SULFA >=320 RESISTANT Resistant     AMPICILLIN/SULBACTAM 16 INTERMEDIATE Intermediate     PIP/TAZO <=4 SENSITIVE Sensitive     *  60,000 COLONIES/ml ESCHERICHIA COLI   Enterococcus species - MIC*    AMPICILLIN <=2 SENSITIVE Sensitive     LEVOFLOXACIN 1 SENSITIVE Sensitive     NITROFURANTOIN <=16 SENSITIVE Sensitive     VANCOMYCIN 1 SENSITIVE Sensitive     * >=100,000 COLONIES/mL ENTEROCOCCUS SPECIES  MRSA PCR Screening     Status: None   Collection Time: 09/04/15 10:51 PM  Result Value Ref Range Status   MRSA by PCR NEGATIVE NEGATIVE Final    Comment:        The GeneXpert MRSA Assay (FDA approved for NASAL specimens only), is one component of a comprehensive MRSA  colonization surveillance program. It is not intended to diagnose MRSA infection nor to guide or monitor treatment for MRSA infections.      Labs: Basic Metabolic Panel:  Recent Labs Lab 09/03/15 0445 09/04/15 0455 09/05/15 0452 09/06/15 0506 09/07/15 0438  NA 138 138 138 136 136  K 5.1 4.8 4.7 4.4 4.6  CL 107 108 107 105 104  CO2 19* 21* 20* 22 21*  GLUCOSE 315* 186* 282* 279* 369*  BUN 39* 45* 50* 43* 41*  CREATININE 0.95 1.00 0.94 0.86 0.78  CALCIUM 8.1* 8.1* 7.8* 7.6* 7.9*   Liver Function Tests: No results for input(s): AST, ALT, ALKPHOS, BILITOT, PROT, ALBUMIN in the last 168 hours. No results for input(s): LIPASE, AMYLASE in the last 168 hours. No results for input(s): AMMONIA in the last 168 hours. CBC:  Recent Labs Lab 09/04/15 0455 09/05/15 0452 09/06/15 0506 09/07/15 0438 09/08/15 0949  WBC 6.6 6.1 6.5 8.0 9.5  HGB 8.2* 7.1* 7.6* 7.3* 8.0*  HCT 25.9* 22.4* 23.6* 22.5* 25.5*  MCV 91.8 91.1 89.7 90.4 90.7  PLT 107* 67* 70* 66* 78*   Cardiac Enzymes: No results for input(s): CKTOTAL, CKMB, CKMBINDEX, TROPONINI in the last 168 hours. BNP: BNP (last 3 results)  Recent Labs  06/19/15 1120 06/23/15 0530 07/05/15 0955  BNP 258.8* 147.8* 348.0*    ProBNP (last 3 results) No results for input(s): PROBNP in the last 8760 hours.  CBG:  Recent Labs Lab 09/07/15 0800 09/07/15 1208 09/07/15 1734 09/07/15 2136 09/08/15 0750  GLUCAP 313* 244* 215* 259* 156*

## 2015-09-08 NOTE — Progress Notes (Signed)
Daily Progress Note   Patient Name: Jeremy Jennings       Date: 09/08/2015 DOB: 1935-11-26  Age: 80 y.o. MRN#: [id number] Attending Physician: Robbie Lis, MD Primary Care Physician: [name], MD Admit Date: 08/31/2015  Reason for Consultation/Follow-up: Establishing goals of care  Subjective: Jeremy Jennings is lying in bed. He is happy with OxyIR and says that it does take his pain away and is effective. He tells me that he got up to the chair today and "feels encouraged today." He is planning radiation and then transition to SNF. We discussed per my conversation with his daughter, Jeremy Jennings, that we should try at least 1-2 weeks of rehab and then she is working on 24/7 care in his home depending on his progress in rehab and what he needs. He has no further questions/concerns. I did give him Hard Choices booklet to read and assist with further discussions with his daughter.    Length of Stay: 7 days  Current Medications: Scheduled Meds:  . atorvastatin  10 mg Oral q1800  . brimonidine  1 drop Right Eye Daily  . cholecalciferol  5,000 Units Oral Daily  . ciprofloxacin  400 mg Intravenous BID  . dexamethasone  4 mg Intravenous Q12H  . feeding supplement (ENSURE ENLIVE)  237 mL Oral TID BM  . folic acid  1 mg Oral Daily  . furosemide  40 mg Oral Daily  . insulin aspart  0-15 Units Subcutaneous TID WC  . insulin aspart  6 Units Subcutaneous TID WC  . insulin detemir  18 Units Subcutaneous QHS  . metoprolol  5 mg Intravenous 4 times per day  . polyethylene glycol  17 g Oral Daily  . prednisoLONE acetate  1 drop Both Eyes BID  . saccharomyces boulardii  250 mg Oral BID  . senna-docusate  1 tablet Oral BID  . tamsulosin  0.4 mg Oral Daily  . timolol  1 drop Right Eye Daily  .  vitamin B-12  1,000 mcg Oral Daily    Continuous Infusions:    PRN Meds: acetaminophen, HYDROmorphone (DILAUDID) injection, metoprolol, ondansetron **OR** ondansetron (ZOFRAN) IV, oxyCODONE  Physical Exam: Physical Exam  Constitutional: He is oriented to person, place, and time. He appears well-developed and well-nourished.  HENT:  Head: Normocephalic and atraumatic.  Cardiovascular: Normal rate.  Pulmonary/Chest: Effort normal. No accessory muscle usage. No tachypnea. No respiratory distress.  Abdominal: Normal appearance.  Neurological: He is alert and oriented to person, place, and time.                Vital Signs: BP 131/76 mmHg  Pulse 95  Temp(Src) 97.5 F (36.4 C) (Oral)  Resp 18  Ht 5\' 11"  (1.803 m)  Wt 74.707 kg (164 lb 11.2 oz)  BMI 22.98 kg/m2  SpO2 100% SpO2: SpO2: 100 % O2 Device: O2 Device: Not Delivered O2 Flow Rate: O2 Flow Rate (L/min): 0 L/min  Intake/output summary:  Intake/Output Summary (Last 24 hours) at 09/08/15 [id number] Last data filed at 09/08/15 1625  Gross per 24 hour  Intake    880 ml  Output   1750 ml  Net   -870 ml   LBM: Last BM Date: 09/02/15 Baseline Weight: Weight: 74.3 kg (163 lb 12.8 oz) Most recent weight: Weight:  (unable to stand)       Palliative Assessment/Data: Flowsheet Rows        Most Recent Value   Intake Tab    Referral Department  Hospitalist   Unit at Time of Referral  Cardiac/Telemetry Unit   Palliative Care Primary Diagnosis  Cancer   Date Notified  09/06/15   Palliative Care Type  New Palliative care   Reason for referral  Clarify Goals of Care   Date of Admission  08/31/15   Date first seen by Palliative Care  09/07/15   # of days Palliative referral response time  1 Day(s)   # of days IP prior to Palliative referral  6   Clinical Assessment    Psychosocial & Spiritual Assessment    Palliative Care Outcomes       Additional Data Reviewed: CBC    Component Value Date/Time   WBC 9.5 09/08/2015 0949    WBC 6.0 05/18/2015 0838   RBC 2.81* 09/08/2015 0949   RBC 2.30* 07/28/2015 1723   RBC 2.57* 05/18/2015 0838   HGB 8.0* 09/08/2015 0949   HGB 7.2* 05/18/2015 0838   HCT 25.5* 09/08/2015 0949   HCT 22.2* 05/18/2015 0838   PLT 78* 09/08/2015 0949   PLT 389 05/18/2015 0838   MCV 90.7 09/08/2015 0949   MCV 86.3 05/18/2015 0838   MCH 28.5 09/08/2015 0949   MCH 27.9 05/18/2015 0838   MCHC 31.4 09/08/2015 0949   MCHC 32.3 05/18/2015 0838   RDW 17.9* 09/08/2015 0949   RDW 19.0* 05/18/2015 0838   LYMPHSABS 0.8 09/01/2015 0412   LYMPHSABS 0.8* 05/18/2015 0838   MONOABS 0.4 09/01/2015 0412   MONOABS 0.5 05/18/2015 0838   EOSABS 0.0 09/01/2015 0412   EOSABS 0.1 05/18/2015 0838   BASOSABS 0.0 09/01/2015 0412   BASOSABS 0.0 05/18/2015 0838    CMP     Component Value Date/Time   NA 137 09/08/2015 0949   NA 142 05/18/2015 0838   K 4.7 09/08/2015 0949   K 3.5 05/18/2015 0838   CL 103 09/08/2015 0949   CO2 22 09/08/2015 0949   CO2 18* 05/18/2015 0838   GLUCOSE 167* 09/08/2015 0949   GLUCOSE 84 05/18/2015 0838   BUN 36* 09/08/2015 0949   BUN 18.3 05/18/2015 0838   CREATININE 0.89 09/08/2015 0949   CREATININE 1.08 08/20/2015 1607   CREATININE 0.8 05/18/2015 0838   CALCIUM 8.0* 09/08/2015 0949   CALCIUM 6.5* 07/15/2015 0945   CALCIUM 8.4 05/18/2015 0838   PROT 5.9* 09/01/2015 0412   PROT  6.5 05/18/2015 0838   ALBUMIN 1.5* 09/01/2015 0412   ALBUMIN 2.4* 05/18/2015 0838   AST 16 09/01/2015 0412   AST 18 05/18/2015 0838   ALT 13* 09/01/2015 0412   ALT <9 05/18/2015 0838   ALKPHOS 314* 09/01/2015 0412   ALKPHOS 488* 05/18/2015 0838   BILITOT 0.8 09/01/2015 0412   BILITOT 0.33 05/18/2015 0838   GFRNONAA >60 09/08/2015 0949   GFRAA >60 09/08/2015 0949       Problem List:  Patient Active Problem List   Diagnosis Date Noted  . Palliative care encounter 09/07/2015  . Bone metastasis ([place])   . Cancer related pain 09/02/2015  . Osseous metastasis ([place]) 09/02/2015  .  Dyslipidemia associated with type 2 diabetes mellitus ([place]) 09/02/2015  . Accelerated hypertension 09/02/2015  . Anemia of chronic disease 09/02/2015  . Thrombocytopenia ([place]) 09/02/2015  . Protein-calorie malnutrition, severe ([place]) 09/02/2015  . Controlled diabetes mellitus without complication, with long-term current use of insulin ([place]) 09/02/2015  . Pressure ulcer 09/02/2015  . Chronic diastolic CHF (congestive heart failure) ([place]) 07/27/2015  . SBO (small bowel obstruction) ischemic s/p LOA & SB resection 06/12/2015 06/11/2015  . Prostate cancer ([place]) 11/20/2013  . Essential hypertension      Palliative Care Assessment & Plan    1.Code Status:  Full code    Code Status Orders        Start     Ordered   09/01/15 0213  Full code   Continuous     09/01/15 0212    Code Status History    Date Active Date Inactive Code Status Order ID Comments User Context   08/21/2015  4:05 PM 08/23/2015 12:05 PM Full Code [id number]  Bonnielee Haff, MD Inpatient   07/28/2015  6:11 PM 07/29/2015  3:11 PM Full Code [id number]  Loletha Grayer, MD Inpatient   07/05/2015  5:29 AM 07/17/2015  6:12 PM Full Code [id number]  Luella Cook, MD ED   06/11/2015  6:10 PM 07/03/2015  6:16 PM Full Code [id number]  Saverio Danker, PA-C Inpatient   05/27/2015  6:22 PM 05/30/2015  3:32 PM DNR [id number]  Florencia Reasons, MD Inpatient   05/27/2015  5:32 PM 05/27/2015  6:22 PM DNR [id number]  Florencia Reasons, MD ED   11/09/2014  4:03 PM 11/11/2014  2:33 PM Full Code [id number]  Robbie Lis, MD Inpatient       2. Goals of Care/Additional Recommendations:  Full scope.   Limitations on Scope of Treatment: Full Scope Treatment  Desire for further Chaplaincy support:no  Psycho-social Needs: Caregiving  Support/Resources and Education on Hospice  3. Symptom Management:  Pain r/t osseous mets bilat hips: Continue radiation therapy. Continue steroids. Continue OxyIR 10 mg every 4 hours prn. Could also consider adding low dose Lyrica  25 mg daily.   Left shoulder pain? He says this is more of a chronic pain: Utilize OxyIR as well.   Constipation?: LBM 2/22. Continue Miralax and Senokot-S.   4. Palliative Prophylaxis:   Bowel Regimen, Delirium Protocol and Frequent Pain Assessment  5. Prognosis: Unable to determine - unfortunately poor with multiple complications delaying treatment/chemo.   6. Discharge Planning:  [facility] for rehab with Palliative care service follow-up    Thank you for allowing the Palliative Medicine Team to assist in the care of this patient.   Time In: 1400 Time Out: 1430 Total Time 67min Prolonged Time Billed  no         Pershing Proud, NP  09/08/2015, 6:22 PM  Please contact Palliative Medicine Team phone at [phone] for questions and concerns.

## 2015-09-09 ENCOUNTER — Ambulatory Visit
Admit: 2015-09-09 | Discharge: 2015-09-09 | Disposition: A | Discharge status: Home / Self Care | Payer: Medicare Other | Attending: Radiation Oncology | Admitting: Radiation Oncology

## 2015-09-09 DIAGNOSIS — C61 Malignant neoplasm of prostate: Secondary | ICD-10-CM | POA: Insufficient documentation

## 2015-09-09 DIAGNOSIS — Z510 Encounter for antineoplastic radiation therapy: Secondary | ICD-10-CM | POA: Insufficient documentation

## 2015-09-10 ENCOUNTER — Ambulatory Visit
Admission: RE | Admit: 2015-09-10 | Discharge: 2015-09-10 | Disposition: A | Discharge status: Home / Self Care | Payer: Medicare Other | Source: Ambulatory Visit | Attending: Radiation Oncology | Admitting: Radiation Oncology

## 2015-09-10 DIAGNOSIS — Z510 Encounter for antineoplastic radiation therapy: Secondary | ICD-10-CM | POA: Diagnosis not present

## 2015-09-11 ENCOUNTER — Ambulatory Visit
Admission: RE | Admit: 2015-09-11 | Discharge: 2015-09-11 | Disposition: A | Discharge status: Home / Self Care | Payer: Medicare Other | Source: Ambulatory Visit | Attending: Radiation Oncology | Admitting: Radiation Oncology

## 2015-09-11 ENCOUNTER — Ambulatory Visit
Admit: 2015-09-11 | Discharge: 2015-09-11 | Disposition: A | Discharge status: Home / Self Care | Payer: Medicare Other | Attending: Radiation Oncology | Admitting: Radiation Oncology

## 2015-09-11 ENCOUNTER — Telehealth: Payer: Self-pay | Admitting: Oncology

## 2015-09-11 VITALS — BP 146/51 | HR 83 | Resp 16 | Wt 164.0 lb

## 2015-09-11 DIAGNOSIS — Z510 Encounter for antineoplastic radiation therapy: Secondary | ICD-10-CM | POA: Diagnosis not present

## 2015-09-11 DIAGNOSIS — C61 Malignant neoplasm of prostate: Secondary | ICD-10-CM

## 2015-09-11 NOTE — Progress Notes (Signed)
  Radiation Oncology         [phone]   Name: Jeremy Jennings MRN: [id number]   Date: 09/11/2015  DOB: 05/22/1936   Weekly Radiation Therapy Management      ICD-9-CM ICD-10-CM   1. Prostate cancer ([place]) 185 C61     Current Dose: 18 Gy  Planned Dose:  30 Gy  Narrative The patient presents for routine under treatment assessment.  Weight and vitals stable. Denies pain. Reports since starting radiation he has only one episode of pain in his left ankle when standing. Reports taking oxycodone every four hours. Decadron 4 mg daily order but, patient is unsure if he still takes this or not. No thrush noted. Dry mouth noted. No edema of bilateral lower extremities noted. Reports occasional dysuria. Denies frequency. Denies urgency or incontinence but, does confirm he uses a urinal for the most part. Denies diarrhea or constipation.   The patient is without complaint. Set-up films were reviewed. The chart was checked.  Physical Findings  weight is 164 lb (74.39 kg). His blood pressure is 146/51 and his pulse is 83. His respiration is 16 and oxygen saturation is 100%. . Weight essentially stable.  No significant changes.  Impression The patient is tolerating radiation.  Plan Continue treatment as planned.         Sheral Apley Tammi Klippel, M.D.    This document serves as a record of services personally performed by Tyler Pita, MD. It was created on his behalf by Lendon Collar, a trained medical scribe. The creation of this record is based on the scribe's personal observations and the provider's statements to them. This document has been checked and approved by the attending provider.

## 2015-09-11 NOTE — Telephone Encounter (Signed)
Radonc requested a f/u appt for pt on 3/3

## 2015-09-11 NOTE — Progress Notes (Signed)
Weight and vitals stable. Denies pain. Reports since starting radiation he has only one episode of pain in his left ankle when standing. Reports taking oxycodone every four hours. Decadron 4 mg daily order but, patient is unsure if he still takes this or not. No thrush noted. Dry mouth noted. No edema of bilateral lower extremities noted. Reports occasional dysuria. Denies frequency. Denies urgency or incontinence but, does confirm he uses a urinal for the most part. Denies diarrhea or constipation.   BP 146/51 mmHg  Pulse 83  Resp 16  Wt 164 lb (74.39 kg)  SpO2 100% Wt Readings from Last 3 Encounters:  09/11/15 164 lb (74.39 kg)  08/23/15 159 lb 6.4 oz (72.303 kg)  08/20/15 165 lb (74.844 kg)

## 2015-09-14 ENCOUNTER — Encounter: Payer: Self-pay | Admitting: *Deleted

## 2015-09-14 ENCOUNTER — Ambulatory Visit
Admission: RE | Admit: 2015-09-14 | Discharge: 2015-09-14 | Disposition: A | Discharge status: Home / Self Care | Payer: Medicare Other | Source: Ambulatory Visit | Attending: Radiation Oncology | Admitting: Radiation Oncology

## 2015-09-14 ENCOUNTER — Telehealth: Payer: Self-pay | Admitting: *Deleted

## 2015-09-14 DIAGNOSIS — Z510 Encounter for antineoplastic radiation therapy: Secondary | ICD-10-CM | POA: Diagnosis not present

## 2015-09-14 NOTE — Telephone Encounter (Signed)
Daughter Currie Hedrick called reporting "message left with scheduling and no return call.  Dad scheduled with Dr. Alen Blew September 22, 2015. I have a work related conference to attend.  I would like to reschedule appointment to this week or the week of the twentieth to be with him.  If no availability I can work out a conference call during his consultation with Dr. Alen Blew and be present during his visit with Dr. Alen Blew via phone."    No standard openings.  New patient slots for provider authorization.  Will forward this request to Dr. Alen Blew.

## 2015-09-15 ENCOUNTER — Ambulatory Visit
Admit: 2015-09-15 | Discharge: 2015-09-15 | Disposition: A | Discharge status: Home / Self Care | Payer: Medicare Other | Attending: Radiation Oncology | Admitting: Radiation Oncology

## 2015-09-15 DIAGNOSIS — Z510 Encounter for antineoplastic radiation therapy: Secondary | ICD-10-CM | POA: Diagnosis not present

## 2015-09-16 ENCOUNTER — Ambulatory Visit
Admission: RE | Admit: 2015-09-16 | Discharge: 2015-09-16 | Disposition: A | Discharge status: Home / Self Care | Payer: Medicare Other | Source: Ambulatory Visit | Attending: Radiation Oncology | Admitting: Radiation Oncology

## 2015-09-16 DIAGNOSIS — Z510 Encounter for antineoplastic radiation therapy: Secondary | ICD-10-CM | POA: Diagnosis not present

## 2015-09-17 ENCOUNTER — Ambulatory Visit
Admission: RE | Admit: 2015-09-17 | Discharge: 2015-09-17 | Disposition: A | Discharge status: Home / Self Care | Payer: Medicare Other | Source: Ambulatory Visit | Attending: Radiation Oncology | Admitting: Radiation Oncology

## 2015-09-17 ENCOUNTER — Encounter: Payer: Self-pay | Admitting: Radiation Oncology

## 2015-09-17 VITALS — BP 124/68 | HR 98 | Resp 16

## 2015-09-17 DIAGNOSIS — Z510 Encounter for antineoplastic radiation therapy: Secondary | ICD-10-CM | POA: Diagnosis not present

## 2015-09-17 DIAGNOSIS — C61 Malignant neoplasm of prostate: Secondary | ICD-10-CM

## 2015-09-17 NOTE — Progress Notes (Addendum)
Vitals stable. Denies pain. Reports since starting radiation his pain has resolved completely. Reports taking oxycodone every four hours. Unsure if he takes decadron 4 mg daily as ordered. No thrush noted. Dry mouth continues. No bilateral lower leg edema noted. Reports occasional dysuria and urinary frequency. Denies urgency or incontinence. Denies diarrhea. One month follow up appointment card given. Patient understands to contact this RN with future needs. Scheduled to follow up with Shadad on 09/22/2015.  BP 124/68 mmHg  Pulse 98  Resp 16  SpO2 100% Wt Readings from Last 3 Encounters:  09/11/15 164 lb (74.39 kg)  08/23/15 159 lb 6.4 oz (72.303 kg)  08/20/15 165 lb (74.844 kg)

## 2015-09-17 NOTE — Progress Notes (Signed)
  Radiation Oncology         [phone]   Name: Jeremy Jennings MRN: [id number]   Date: 09/17/2015  DOB: 07-Dec-1935   Weekly Radiation Therapy Management      ICD-9-CM ICD-10-CM   1. Prostate cancer ([place]) 185 C61     Current Dose: 30 Gy  Planned Dose:  30 Gy  Narrative The patient presents for routine under treatment assessment.  Vitals stable. Denies pain. Reports since starting radiation his pain has resolved completely. Reports taking oxycodone every four hours. Unsure if he takes decadron 4 mg daily as ordered. No thrush noted. Dry mouth continues. No bilateral lower leg edema noted. Reports occasional dysuria and urinary frequency. Denies urgency or incontinence. Denies diarrhea. One month follow up appointment card given. Patient understands to contact this RN with future needs. Scheduled to follow up with Shadad on 09/22/2015.  The patient is without complaint. Set-up films were reviewed. The chart was checked.  Physical Findings  blood pressure is 124/68 and his pulse is 98. His respiration is 16 and oxygen saturation is 100%. . Weight essentially stable.  No significant changes.  Impression The patient is tolerating radiation.  Plan Continue treatment as planned. The patient was given a one month follow up appointment card. He is scheduled to follow up with Dr. Alen Blew of medical oncology on 09/22/15.         Jeremy Jennings, M.D.   This document serves as a record of services personally performed by Jeremy Pita, MD. It was created on his behalf by Arlyce Harman, a trained medical scribe. The creation of this record is based on the scribe's personal observations and the provider's statements to them. This document has been checked and approved by the attending provider.

## 2015-09-18 ENCOUNTER — Telehealth: Payer: Self-pay | Admitting: Oncology

## 2015-09-18 NOTE — Telephone Encounter (Signed)
Per desk nurse r/s 3/14 lab/fu to 3/22 @ 8:45 am and 9:15 am (overbook).  Patient scheduled for 3/22. Patient scheduled for 10:15 am and 10:45 am due to 9:15 am slot w/FS taken. Spoke with patient dtr [name] re new date/time for 3/22 @ 10:15 am.

## 2015-09-21 ENCOUNTER — Other Ambulatory Visit: Payer: Self-pay

## 2015-09-21 ENCOUNTER — Telehealth: Payer: Self-pay

## 2015-09-21 NOTE — Telephone Encounter (Signed)
Patient's dtr [name] called to change appt on 09/30/15.  POF placed by Probation officer.

## 2015-09-22 ENCOUNTER — Telehealth: Payer: Self-pay | Admitting: Oncology

## 2015-09-22 ENCOUNTER — Other Ambulatory Visit: Payer: Medicare Other

## 2015-09-22 ENCOUNTER — Ambulatory Visit: Payer: Medicare Other | Admitting: Oncology

## 2015-09-22 NOTE — Telephone Encounter (Signed)
Spoke with patient daughter in regards to patient r/s appt date/time per 3/14 pof

## 2015-09-23 ENCOUNTER — Telehealth: Payer: Self-pay | Admitting: Cardiovascular Disease

## 2015-09-23 NOTE — Telephone Encounter (Signed)
New message     Calling to get verbal orders for wound care

## 2015-09-23 NOTE — Telephone Encounter (Signed)
Called [place] back with [place]. Informed her that she needs to call patient's new PCP for orders. [name] verbalized understanding.

## 2015-09-25 ENCOUNTER — Telehealth: Payer: Self-pay | Admitting: Emergency Medicine

## 2015-09-25 NOTE — Telephone Encounter (Signed)
[facility] has been notified.

## 2015-09-25 NOTE — Telephone Encounter (Signed)
[place] nurse from [place] called to request verbal orders for hydrocolloid dressing, she was currently in the home with the pt. Please advise. Pt has appt with 10/05/15 to establish.

## 2015-09-25 NOTE — Telephone Encounter (Signed)
ok 

## 2015-09-29 ENCOUNTER — Telehealth: Payer: Self-pay | Admitting: Emergency Medicine

## 2015-09-29 DIAGNOSIS — C61 Malignant neoplasm of prostate: Secondary | ICD-10-CM

## 2015-09-29 NOTE — Telephone Encounter (Signed)
Referral ordered

## 2015-09-29 NOTE — Telephone Encounter (Signed)
[facility] home health nurse called to inform that pt is not progressing with OT and PT. They feel that he is in need of a Hospice referral. We are supposed to see pt on 10/05/15 to establish care. Please advise if you are okay with referral and if pt should keep appt to establish, he has had to cancel his last two appt with oncology due to not being able to make it in the office.

## 2015-09-29 NOTE — Telephone Encounter (Signed)
Have called Hospice to place referral.

## 2015-09-30 ENCOUNTER — Ambulatory Visit: Payer: Medicare Other | Admitting: Oncology

## 2015-09-30 ENCOUNTER — Other Ambulatory Visit: Payer: Medicare Other

## 2015-09-30 ENCOUNTER — Other Ambulatory Visit: Payer: Self-pay | Admitting: *Deleted

## 2015-09-30 MED ORDER — MEGESTROL ACETATE 625 MG/5ML PO SUSP: 625.0000 mg | Freq: Every day | ORAL | Status: AC

## 2015-09-30 NOTE — Telephone Encounter (Signed)
Per dr Alen Blew, okay to refill megace. E-scribed to patient's pharmacy.

## 2015-10-01 ENCOUNTER — Telehealth: Payer: Self-pay | Admitting: Cardiovascular Disease

## 2015-10-01 NOTE — Telephone Encounter (Signed)
Referred Marlowe Kays with [facility] to Dr. Quay Burow office.

## 2015-10-01 NOTE — Telephone Encounter (Signed)
New Message  OT w/ [facility] requested to speak w/ RN concerning a verbal order to resume care. Please call back and discuss.

## 2015-10-02 ENCOUNTER — Encounter: Payer: Self-pay | Admitting: Oncology

## 2015-10-02 ENCOUNTER — Telehealth: Payer: Self-pay | Admitting: Internal Medicine

## 2015-10-02 NOTE — Telephone Encounter (Signed)
ok 

## 2015-10-02 NOTE — Telephone Encounter (Signed)
PLease advise

## 2015-10-02 NOTE — Telephone Encounter (Signed)
Spoke with [place] to inform.

## 2015-10-02 NOTE — Telephone Encounter (Signed)
Please advise on PT orders.

## 2015-10-02 NOTE — Telephone Encounter (Signed)
[place] called from [place] request PT order. 1 time this week, 3 times next week, 1 time a week for 3 week after that.   # [phone]

## 2015-10-02 NOTE — Progress Notes (Signed)
essie at [employer] will fax form for prior auth for megestrol

## 2015-10-02 NOTE — Telephone Encounter (Signed)
Jeremy Jennings from [facility] is requesting to resume OT 2 x 2wks, 1 x 1wk She can be reached at [phone]

## 2015-10-02 NOTE — Telephone Encounter (Signed)
LVM with [facility] Kays giving verbal orders per MD

## 2015-10-02 NOTE — Progress Notes (Signed)
Left form for dr Alen Blew for [facility]

## 2015-10-05 ENCOUNTER — Ambulatory Visit: Payer: Medicare Other | Admitting: Internal Medicine

## 2015-10-05 ENCOUNTER — Telehealth: Payer: Self-pay | Admitting: Internal Medicine

## 2015-10-05 NOTE — Telephone Encounter (Signed)
Patients daughter Chauncey Reading) called to advise that hey are missing his appt today due to his noncompliance. He is refusing to come in because she is a new doc and he just wont cooperate. She is asking that you give her a call when you can

## 2015-10-05 NOTE — Telephone Encounter (Signed)
Spoke with pts daughter. appt moved.

## 2015-10-05 NOTE — Telephone Encounter (Signed)
LVM for daughter to return call. 

## 2015-10-05 NOTE — Telephone Encounter (Signed)
pts daughter returned your call. Please give her a call back

## 2015-10-06 ENCOUNTER — Telehealth: Payer: Self-pay | Admitting: Internal Medicine

## 2015-10-06 ENCOUNTER — Encounter: Payer: Self-pay | Admitting: Oncology

## 2015-10-06 NOTE — Telephone Encounter (Signed)
Yvette from Hospice called and stated pt's family wants to wait until after his appt with [place]. She states they will need a new referral put in when their ready

## 2015-10-06 NOTE — Progress Notes (Signed)
[employer] thru 07/10/16 approved pa [id number]

## 2015-10-09 ENCOUNTER — Telehealth: Payer: Self-pay | Admitting: Internal Medicine

## 2015-10-09 NOTE — Telephone Encounter (Signed)
Janella from [facility] called and wanted to talk with you while she is there with patient. He hasn't had a BM in almost 2 weeks and she wanted to talk about getting an order to do an enema.  She states the family has been giving him Senokot extra strength.  She can be reached at [phone]

## 2015-10-09 NOTE — Telephone Encounter (Signed)
Please advise 

## 2015-10-09 NOTE — Progress Notes (Signed)
  Radiation Oncology         [phone]) [phone] ________________________________  Name: Jeremy Jennings MRN: [id number]  Date: 09/17/2015  DOB: June 01, 1936  End of Treatment Note  Diagnosis:   80 y.o. male was diagnosed with prostate cancer in 2015. At that time, his PSA was 30 and his biopsy showed a Gleason's Score of 4+5 and 5+5 throughout all cores. Staging CT 4/14 showed pelvic adenopathy and bone scan 4/14 showed left pelvic and right 11th rib met.      Indication for treatment:  Palliation       Radiation treatment dates:   09/04/2015-09/17/2015  Site/dose:   The Left Hip Bone Met was treated to 30 Gy in 10 fractions at 3 Gy per fraction.   Beams/energy:   3 Field / 15X  Narrative: The patient tolerated radiation treatment relatively well.  Patient experienced occasional dysuria and urinary frequency with treatment. Reported since starting radiation his pain resolved completely.   Plan: The patient has completed radiation treatment. The patient will return to radiation oncology clinic for routine followup in one month. I advised him to call or return sooner if he has any questions or concerns related to his recovery or treatment. ________________________________  Sheral Apley. Tammi Klippel, M.D.  This document serves as a record of services personally performed by Tyler Pita, MD. It was created on his behalf by Arlyce Harman, a trained medical scribe. The creation of this record is based on the scribe's personal observations and the provider's statements to them. This document has been checked and approved by the attending provider.

## 2015-10-09 NOTE — Telephone Encounter (Signed)
Spoke with [facility] nurse to inform.

## 2015-10-09 NOTE — Telephone Encounter (Signed)
Ok to give enema as long he has been passing gas and does not have significant abdominal pain

## 2015-10-10 ENCOUNTER — Emergency Department (HOSPITAL_COMMUNITY)
Admission: EM | Admit: 2015-10-10 | Discharge: 2015-10-10 | Disposition: A | Discharge status: Home / Self Care | Payer: Medicare Other | Attending: Emergency Medicine | Admitting: Emergency Medicine

## 2015-10-10 DIAGNOSIS — C7951 Secondary malignant neoplasm of bone: Secondary | ICD-10-CM | POA: Diagnosis not present

## 2015-10-10 DIAGNOSIS — Z79899 Other long term (current) drug therapy: Secondary | ICD-10-CM | POA: Diagnosis not present

## 2015-10-10 DIAGNOSIS — Z794 Long term (current) use of insulin: Secondary | ICD-10-CM | POA: Diagnosis not present

## 2015-10-10 DIAGNOSIS — C61 Malignant neoplasm of prostate: Secondary | ICD-10-CM | POA: Insufficient documentation

## 2015-10-10 DIAGNOSIS — Z87891 Personal history of nicotine dependence: Secondary | ICD-10-CM | POA: Diagnosis not present

## 2015-10-10 DIAGNOSIS — R000 Tachycardia, unspecified: Secondary | ICD-10-CM | POA: Insufficient documentation

## 2015-10-10 DIAGNOSIS — Z7952 Long term (current) use of systemic steroids: Secondary | ICD-10-CM | POA: Insufficient documentation

## 2015-10-10 DIAGNOSIS — Z792 Long term (current) use of antibiotics: Secondary | ICD-10-CM | POA: Insufficient documentation

## 2015-10-10 DIAGNOSIS — L89154 Pressure ulcer of sacral region, stage 4: Secondary | ICD-10-CM | POA: Insufficient documentation

## 2015-10-10 DIAGNOSIS — G893 Neoplasm related pain (acute) (chronic): Secondary | ICD-10-CM | POA: Diagnosis present

## 2015-10-10 MED ORDER — HYDROMORPHONE HCL 1 MG/ML IJ SOLN
1.0000 mg | Freq: Once | INTRAMUSCULAR | Status: AC
  Administered 2015-10-10: 1 mg via INTRAVENOUS
  Filled 2015-10-10: qty 1

## 2015-10-10 NOTE — ED Notes (Signed)
Called [facility] for transport.  

## 2015-10-10 NOTE — Care Management Note (Addendum)
Case Management Note  Patient Details  Name: MALEKI AAGARD MRN: [id number] Date of Birth: 12-14-1935  Subjective/Objective:     metastatic prostate cancer               Action/Plan: Discharge Planning: NCM spoke to dtr, Valaria Good # [phone]. States pt has Research scientist (medical), PT and OT at home. States they have Palliative Care with [facility]. States postponed [facility] Palliative Consult at home but has number to call to arrange appt. States pt has hospital bed and [place] [facility] at home. Dtr states they are ready for Hospice. States she will follow up with Oncologist on Monday to discuss [place]. NCM contacted [facility] waiting call back.   5:13 pm Received call back from [place], will send referral to [facility] Palliative Team to follow up with pt's dtr on Monday.    Expected Discharge Date:  10/10/2015              Expected Discharge Plan:  [place]  In-House Referral:  NA  Discharge planning Services  CM Consult  Post Acute Care Choice:  Home Health, Resumption of Svcs/PTA Provider Choice offered to:  Adult Children  DME Arranged:  N/A DME Agency:  NA  HH Arranged:  RN, PT, OT HH Agency:  [facility]  Status of Service:  Completed, signed off  Medicare Important Message Given:    Date Medicare IM Given:    Medicare IM give by:    Date Additional Medicare IM Given:    Additional Medicare Important Message give by:     If discussed at [place] of Stay Meetings, dates discussed:    Additional Comments:  Erenest Rasher, RN 10/10/2015, 4:24 PM

## 2015-10-10 NOTE — ED Provider Notes (Signed)
CSN: [id number]     Arrival date & time 10/10/15  1330 History   First MD Initiated Contact with Patient 10/10/15 1333     Chief Complaint  Patient presents with  . Pain     (Consider location/radiation/quality/duration/timing/severity/associated sxs/prior Treatment) HPI Jeremy Jennings is a 80 y.o. male history of prostate cancer with bone metastasis, comes in for evaluation of pain control. Daughter and son-in-law are at bedside and reported the patient has had increasing pain over the past 2 days. His home every 4 oxycodone is not relieving his pain. Patient reports this pain is typical of his bone cancer pain and is not unusual. Denies any other fevers, chills, chest pain or shortness of breath, abdominal pain, urinary symptoms. He does report constipation and is being evaluated by his PCP for this problem. Family at bedside also reports bedsores over the past several weeks. Patient does have home health and they are managing his wounds.  No past medical history on file. Past Surgical History  Procedure Laterality Date  . Tonsillectomy    . Hemorroidectomy  1969  . Esophagogastroduodenoscopy N/A 05/28/2015    Procedure: ESOPHAGOGASTRODUODENOSCOPY (EGD);  Surgeon: [name], MD;  Location: [name];  Service: Endoscopy;  Laterality: N/A;  . Inguinal hernia repair Bilateral AFE 16-17  . Hernia repair  1981    [facility]  . Laparotomy N/A 06/12/2015    Procedure: EXPLORATORY LAPAROTOMY;  Surgeon: [name], MD;  Location: [facility];  Service: General;  Laterality: N/A;  . Bowel resection N/A 06/12/2015    Procedure: SMALL BOWEL RESECTION;  Surgeon: [name], MD;  Location: [facility];  Service: General;  Laterality: N/A;   Family History  Problem Relation Age of Onset  . Cancer Neg Hx   . Heart attack Neg Hx     unknown  . Stroke Neg Hx     unknown  . Kidney failure Brother   . Kidney failure Sister   . Diabetes Mother   . Hypertension Father    Social History   Substance Use Topics  . Smoking status: Former Smoker -- 0.25 packs/day for 20 years    Types: Cigarettes    Quit date: 07/12/1979  . Smokeless tobacco: Never Used  . Alcohol Use: No    Review of Systems A 10 point review of systems was completed and was negative except for pertinent positives and negatives as mentioned in the history of present illness     Allergies  Review of patient's allergies indicates no known allergies.  Home Medications   Prior to Admission medications   Medication Sig Start Date End Date Taking? Authorizing Provider  acetaminophen (TYLENOL) 325 MG tablet Take 650 mg by mouth every 6 (six) hours as needed for mild pain or moderate pain.    Historical Provider, MD  atorvastatin (LIPITOR) 10 MG tablet Take 10 mg by mouth daily.    Historical Provider, MD  Cholecalciferol (VITAMIN D3) 5000 units TABS Take 5,000 Units by mouth daily.    Historical Provider, MD  ciprofloxacin (CIPRO) 500 MG tablet Take 1 tablet (500 mg total) by mouth 2 (two) times daily. 09/08/15   Robbie Lis, MD  COMBIGAN 0.2-0.5 % ophthalmic solution Place 1 drop into the right eye daily.     Historical Provider, MD  dexamethasone (DECADRON) 4 MG tablet Take 1 tablet (4 mg total) by mouth daily. 09/08/15   Robbie Lis, MD  feeding supplement, ENSURE ENLIVE, (ENSURE ENLIVE) LIQD Take 237 mLs by mouth  2 (two) times daily between meals. 07/17/15   Modena Jansky, MD  folic acid (FOLVITE) 1 MG tablet Take 1 mg by mouth daily.    Historical Provider, MD  furosemide (LASIX) 40 MG tablet Take 40 mg by mouth daily.     Historical Provider, MD  insulin aspart (NOVOLOG) 100 UNIT/ML injection Inject 0-9 Units into the skin 3 (three) times daily with meals as needed for high blood sugar. Pt uses per sliding scale:    70-150:  0 units  151-200:  1 unit  201-250:  2 units  251-300:  3 units  301-350:  5 units  351-400:  7 units  401-500:  9 units and notify MD    Historical Provider, MD  insulin  aspart (NOVOLOG) 100 UNIT/ML injection Inject 6 Units into the skin 3 (three) times daily with meals. 09/08/15   Robbie Lis, MD  insulin detemir (LEVEMIR) 100 UNIT/ML injection Inject 0.18 mLs (18 Units total) into the skin at bedtime. 09/08/15   Robbie Lis, MD  megestrol (MEGACE ES) 625 MG/5ML suspension Take 5 mLs (625 mg total) by mouth daily. 09/30/15   Wyatt Portela, MD  megestrol (MEGACE) 20 MG tablet Take 20 mg by mouth 2 (two) times daily. Takes along with 200mg  of liquid for a total of 220mg  at one time    Historical Provider, MD  metoprolol (LOPRESSOR) 50 MG tablet Take 1 tablet (50 mg total) by mouth 2 (two) times daily. 07/27/15   Dayna N Dunn, PA-C  ondansetron (ZOFRAN) 4 MG tablet Take 4 mg by mouth every 8 (eight) hours as needed for nausea or vomiting. As directed 07/03/15   Historical Provider, MD  oxyCODONE 10 MG TABS Take 1 tablet (10 mg total) by mouth every 4 (four) hours as needed for moderate pain. 09/08/15   Robbie Lis, MD  prednisoLONE acetate (PRED FORTE) 1 % ophthalmic suspension Place 1 drop into both eyes 2 (two) times daily.  04/25/13   Historical Provider, MD  saccharomyces boulardii (FLORASTOR) 250 MG capsule Take 1 capsule (250 mg total) by mouth 2 (two) times daily. 07/17/15   Modena Jansky, MD  senna-docusate (SENOKOT-S) 8.6-50 MG tablet Take 1 tablet by mouth 2 (two) times daily. 09/08/15   Robbie Lis, MD  tamsulosin (FLOMAX) 0.4 MG CAPS capsule Take 0.4 mg by mouth daily.     Historical Provider, MD  vitamin B-12 (CYANOCOBALAMIN) 1000 MCG tablet Take 1,000 mcg by mouth daily.    Historical Provider, MD   BP 107/50 mmHg  Pulse 105  Temp(Src) 98 F (36.7 C) (Oral)  Resp 16  SpO2 98% Physical Exam  Constitutional: He is oriented to person, place, and time. He appears well-developed and well-nourished.  HENT:  Head: Normocephalic and atraumatic.  Mouth/Throat: Oropharynx is clear and moist.  Eyes: Conjunctivae are normal. Pupils are equal, round, and  reactive to light. Right eye exhibits no discharge. Left eye exhibits no discharge. No scleral icterus.  Neck: Neck supple.  Cardiovascular: Regular rhythm and normal heart sounds.   Mild tachycardia  Pulmonary/Chest: Effort normal and breath sounds normal. No respiratory distress. He has no wheezes. He has no rales.  Abdominal: Soft. There is no tenderness.  Musculoskeletal: He exhibits no tenderness.  Neurological: He is alert and oriented to person, place, and time.  Cranial Nerves II-XII grossly intact  Skin: Skin is warm and dry. No rash noted.  4 sacral stage II decubitus ulcers. No surrounding erythema, drainage or  other evidence of infection. Appear to be adequately treated  Psychiatric: He has a normal mood and affect.  Nursing note and vitals reviewed.   ED Course  Procedures (including critical care time) Labs Review Labs Reviewed - No data to display  Imaging Review No results found. I have personally reviewed and evaluated these images and lab results as part of my medical decision-making.   EKG Interpretation None     Meds given in ED:  Medications  HYDROmorphone (DILAUDID) injection 1 mg (1 mg Intravenous Given 10/10/15 1404)    New Prescriptions   No medications on file   Filed Vitals:   10/10/15 1346  BP: 107/50  Pulse: 105  Temp: 98 F (36.7 C)  TempSrc: Oral  Resp: 16  SpO2: 98%    MDM  ADARSH VELDMAN is a 80 y.o. male history of metastatic prostate cancer, bone metastasis, comes in for evaluation of cancer pain control. Pain increased today is typical of cancer pain. Not relieved with home meds. Family at bedside  wishes for pain control at this time and politely declined any further investigatory measures. They are also considering moving towards hospice care. They spoke with social work and are planning on discussing this further with PCP on Monday. Daughter at bedside and has reported that patient has decided to be DNR. Patient reports he has  relief after one dose of analgesia in the emergency department. Denies any other discomfort or medical complaints at this time. He is ready for discharge. Overall he appears well, nontoxic and hemodynamically stable. Original mild tachycardia on arrival likely secondary to pain. No evidence of infection or other emergent process. Discussed return precautions with family at bedside, they verbalized understanding and agreement with this plan and subsequent discharge Prior to patient discharge, I discussed and reviewed this case with Dr.Belfi   Final diagnoses:  Cancer related pain        Jeremy Locket, PA-C 10/10/15 [place], MD 10/10/15 1525

## 2015-10-10 NOTE — ED Notes (Signed)
Per [facility] pt from home reports breaking through pain , takes oxycodone q 4 H. Per ems family are considering hospice care. BP 102/51 Hr 89 O2 100 % RA CBG 289. Alert and oriented x4. Pt is non ambulatory . Pt is normally weak per EMS per family.

## 2015-10-10 NOTE — ED Notes (Signed)
Bed: [id number] Expected date: 10/10/15 Expected time: 1:21 PM Means of arrival: Ambulance Comments: Ca pt pain /breaktrhrough meds not working

## 2015-10-10 NOTE — Discharge Instructions (Signed)
Please follow-up with your doctor on Monday for reevaluation and further discussion of hospice care. Return to ED for any new or worsening symptoms as we discussed.

## 2015-10-12 ENCOUNTER — Telehealth: Payer: Self-pay | Admitting: Internal Medicine

## 2015-10-12 NOTE — Telephone Encounter (Signed)
Is requesting new Hospice order for patient.  Needs to see patient today.  States patient is declining rapidly.  Requesting call back ASAP.

## 2015-10-12 NOTE — Telephone Encounter (Signed)
ok 

## 2015-10-12 NOTE — Telephone Encounter (Signed)
Please advise 

## 2015-10-13 ENCOUNTER — Other Ambulatory Visit: Payer: Self-pay | Admitting: *Deleted

## 2015-10-13 ENCOUNTER — Telehealth: Payer: Self-pay | Admitting: *Deleted

## 2015-10-14 ENCOUNTER — Ambulatory Visit: Payer: Medicare Other | Admitting: Oncology

## 2015-10-14 ENCOUNTER — Other Ambulatory Visit: Payer: Medicare Other

## 2015-10-15 ENCOUNTER — Telehealth: Payer: Self-pay

## 2015-10-15 NOTE — Telephone Encounter (Signed)
On 10/15/2015 I received a death certificate from [place] (original). The death certificate is for entombment. The patient is a patient of Doctor Bil[facility]ling. The death certificate will be taken to [facility] this pm for signature. On 10/16/2015 After doing some research the physician that is supposed to sign the death certificate is at our [facility] ([facility] [place]).  I called the funeral home to let them know this information and going to shred the death certificate per their request.

## 2015-10-19 ENCOUNTER — Ambulatory Visit: Payer: Medicare Other | Admitting: Internal Medicine

## 2015-10-20 ENCOUNTER — Ambulatory Visit: Payer: Self-pay | Admitting: Radiation Oncology

## 2015-10-27 ENCOUNTER — Ambulatory Visit: Payer: Medicare Other | Admitting: Cardiovascular Disease

## 2015-11-05 ENCOUNTER — Ambulatory Visit: Payer: Self-pay | Admitting: Radiation Oncology

## 2015-11-09 NOTE — Telephone Encounter (Signed)
shadad's

## 2015-11-09 NOTE — Telephone Encounter (Signed)
Received call from daughter Valaria Good informing Dr. Alen Blew that pt expired this am at home.  Informed [name] that message will be relayed to Dr. Alen Blew.  Nurse offered condolences to daughter and family.

## 2015-11-09 DEATH — deceased

## 2015-12-16 ENCOUNTER — Ambulatory Visit: Payer: Medicare Other | Admitting: Internal Medicine

## 2016-04-26 IMAGING — NM NM BONE WHOLE BODY
2 series · 2 of 2 positions shown · non-contrast
Comparison: Nuclear bone scan of June 30, 2014

CLINICAL DATA: History of metastatic prostate malignancy, elevated
PSA, currently asymptomatic.

EXAM:
NUCLEAR MEDICINE WHOLE BODY BONE SCAN
TECHNIQUE: Whole body anterior and posterior images were obtained approximately
3 hours after intravenous injection of radiopharmaceutical.
RADIOPHARMACEUTICALS:  26.3 mCi Eechnetium-KK MDP

[Series 1: wbr_bone_60 whole body · 2.66mm/px · 1 of 1 slices shown (1 of 2)]
[im 1/1]
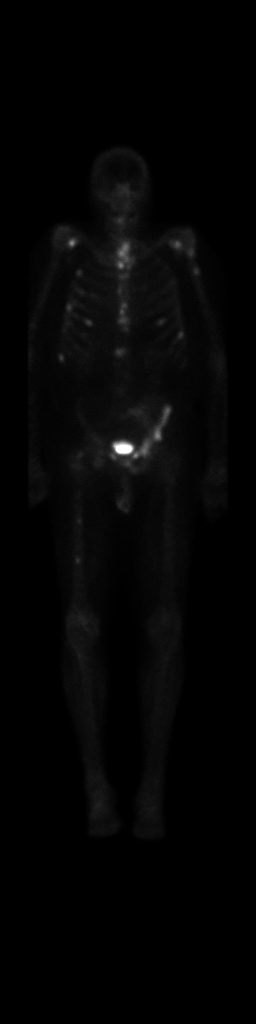

[Series 1: wbr_bone_60 whole body · 2.66mm/px · 1 of 1 slices shown (2 of 2)]
[im 1/1]
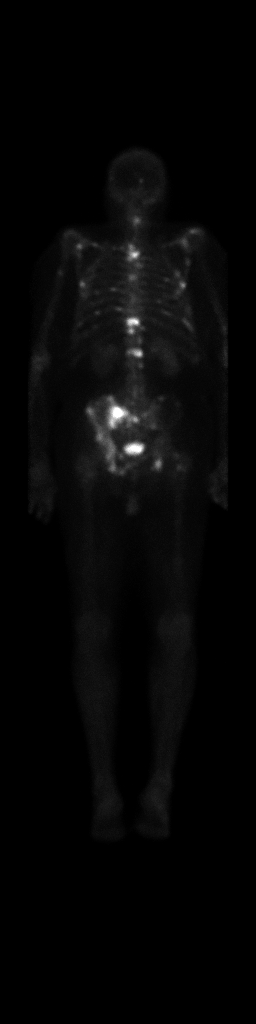

[2 of 2 positions shown; findings below may reference images not displayed]

FINDINGS: There is adequate uptake of the radiopharmaceutical by the skeleton.
Adequate soft tissue clearance and renal activity is present.

Calvarium: There is a punctate focus of increased uptake in the
paramedian region of the occipital bone. There is minimally
increased uptake in the left lateral parietal bone.

Spine: There are multiple foci of increased uptake within the
cervical, thoracic, and lumbar spine. These are new since the
previous study. The most significant uptake is noted an
approximately T2, T10, and L1.

Ribs and sternum: There are multiple bilateral foci of increased
uptake consistent with widespread metastases.

Pectoral girdle: There is a focus of increased uptake in the left
proximal humeral shaft. There is also mildly increased uptake in the
left humeral neck.

Pelvis and pelvic girdle: There is evidence of progressive disease
involving the left hemipelvis including the left sacral ala and mid
body of the sacrum. Abnormal activity in the ischium and pubic bones
on the left is present and more conspicuous. There are at least 2
foci of increased uptake in the proximal and mid shaft of the right
femur. There is mildly increased uptake in the intertrochanteric
region and neck of the right femur.

COMPLICATIONS:
Interval development of innumerable new widespread bony metastases
as described with progressive changes in the left hemipelvis.

## 2016-05-07 IMAGING — CT CT RENAL STONE PROTOCOL
2 of 3 series · 16 of 34 positions shown, 18 images · non-contrast
Comparison: Bone scan 10/29/2014, and pelvis 10/29/2014

CLINICAL DATA: Generalized abdominal pain in lower posterior chest
pain since [REDACTED]. No nausea, vomiting, diarrhea. Lower back pain
yesterday. History of prostate cancer.

EXAM:
CT ABDOMEN AND PELVIS WITHOUT CONTRAST
TECHNIQUE: Multidetector CT imaging of the abdomen and pelvis was performed
following the standard protocol without IV contrast.

[Series 4: lung · axial · 0.80mm/px · z∈[-118,-33]mm · 13 of 20 slices shown, 15 images]
[im 2/20  soft-tissue]
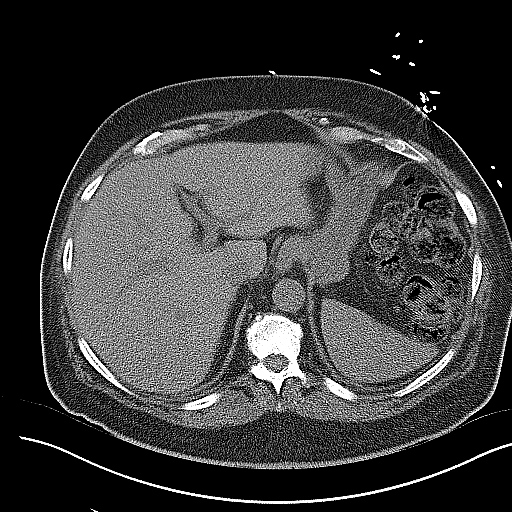
[im 2/20  bone]
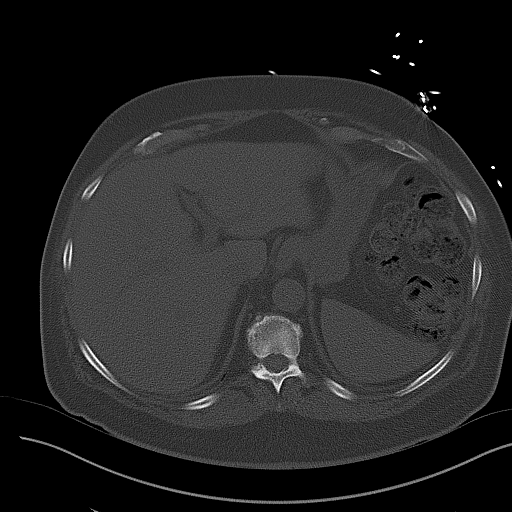
[im 4/20  soft-tissue]
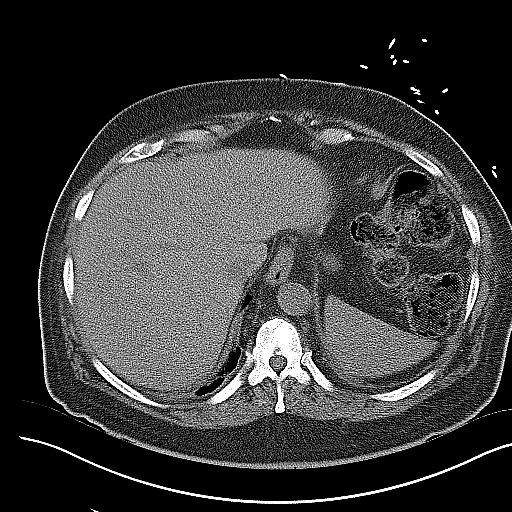
[im 5/20  soft-tissue]
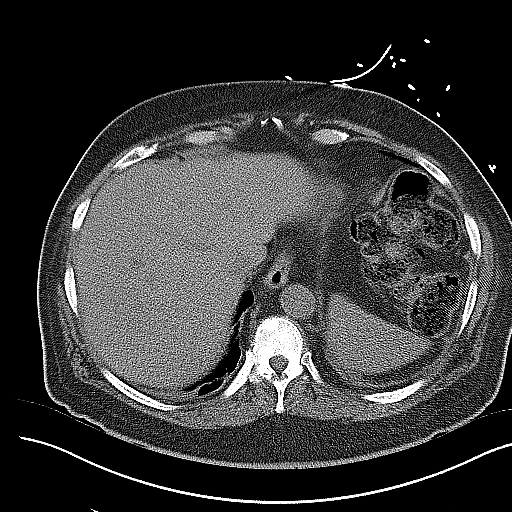
[im 6/20  soft-tissue]
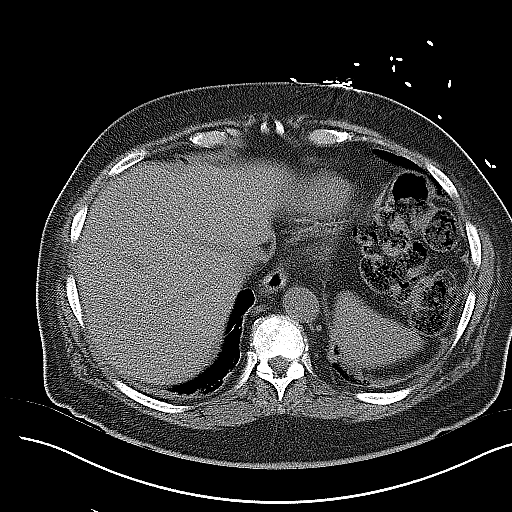
[im 8/20  soft-tissue]
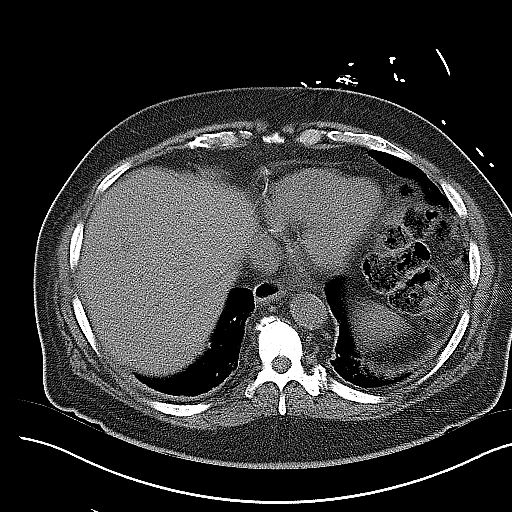
[im 9/20  soft-tissue]
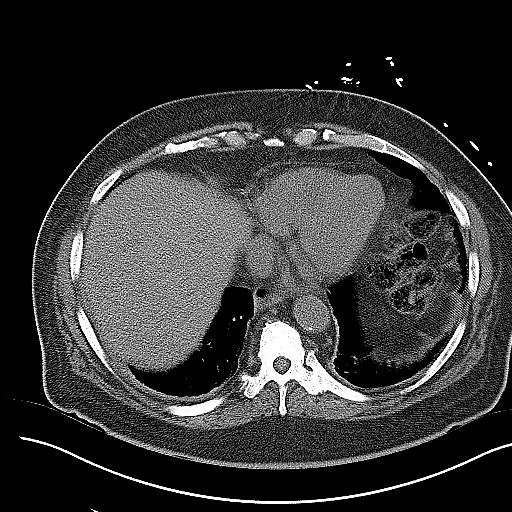
[im 11/20  soft-tissue]
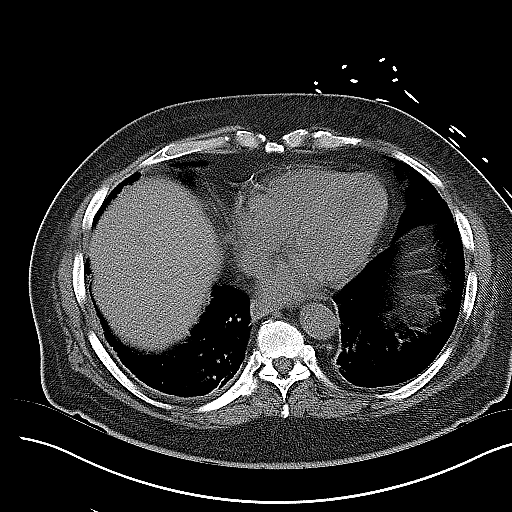
[im 12/20  soft-tissue]
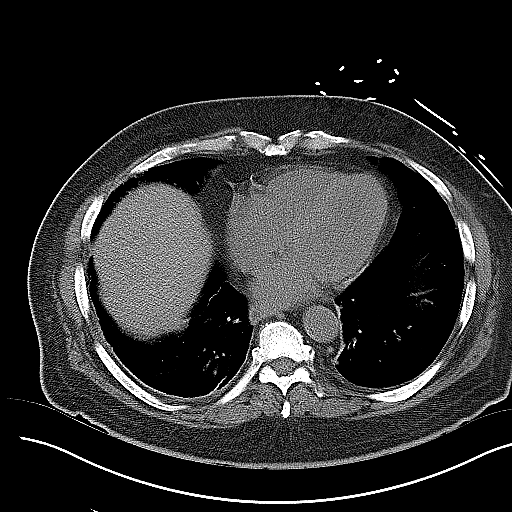
[im 13/20  soft-tissue]
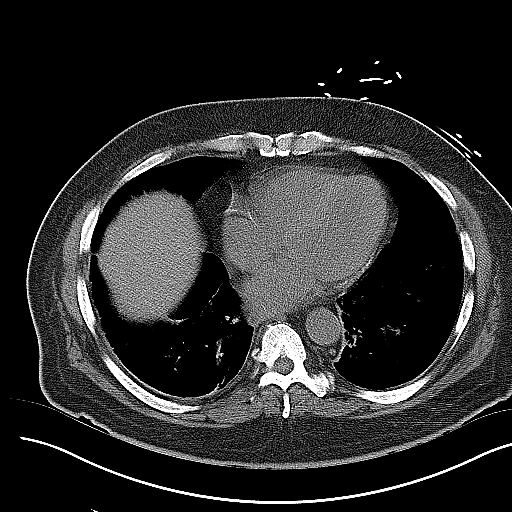
[im 13/20  bone]
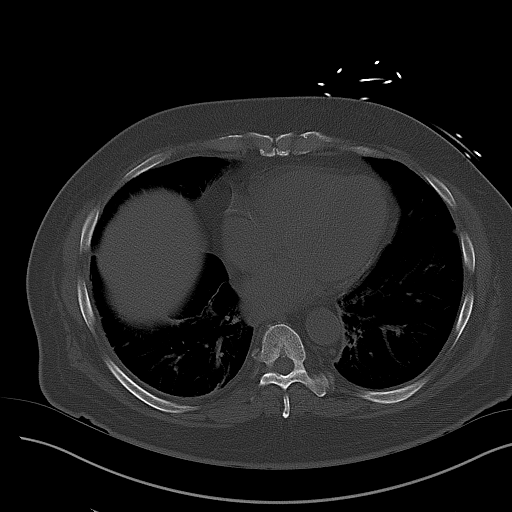
[im 15/20  soft-tissue]
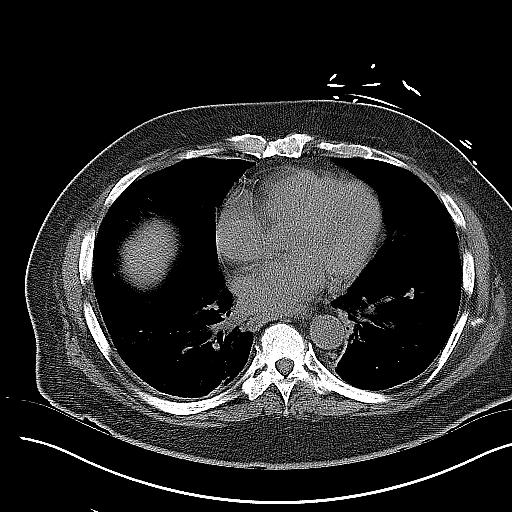
[im 16/20  soft-tissue]
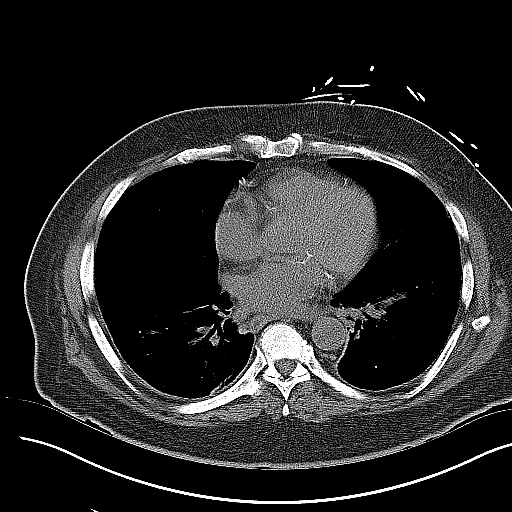
[im 17/20  soft-tissue]
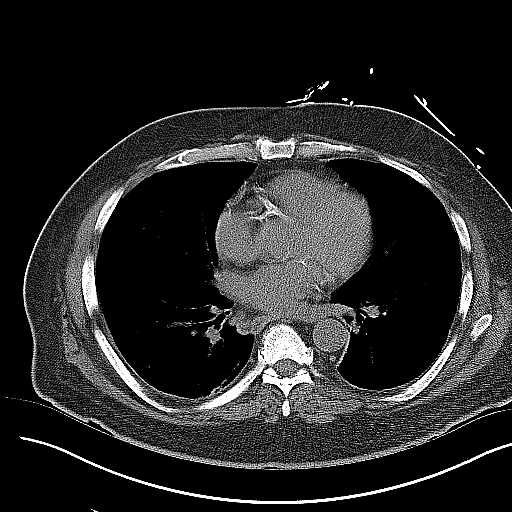
[im 19/20  soft-tissue]
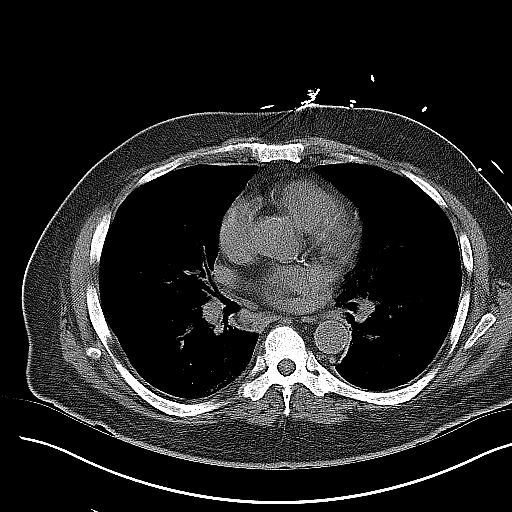

[Series 5: coronal · coronal · 0.79mm/px · 3 of 124 slices shown]
[im 42/124  soft-tissue]
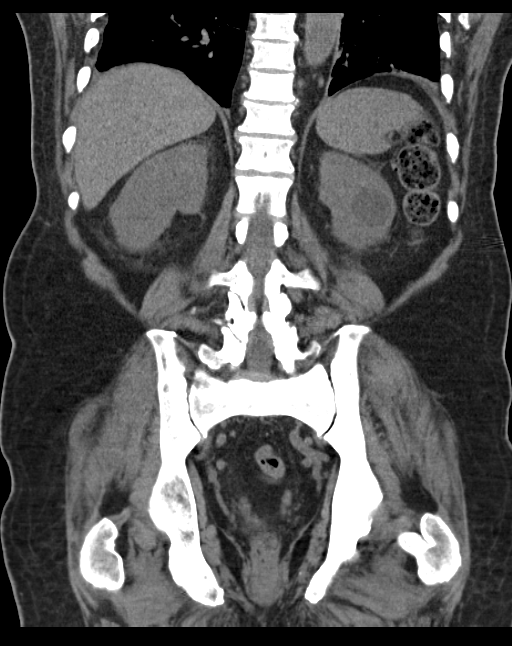
[im 55/124  soft-tissue]
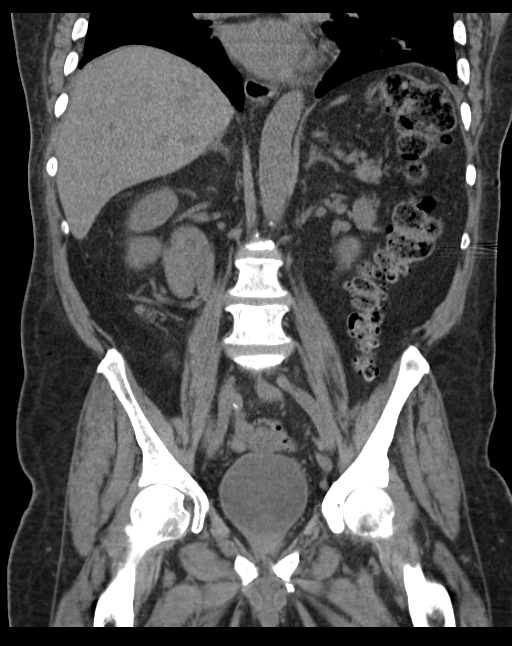
[im 69/124  soft-tissue]
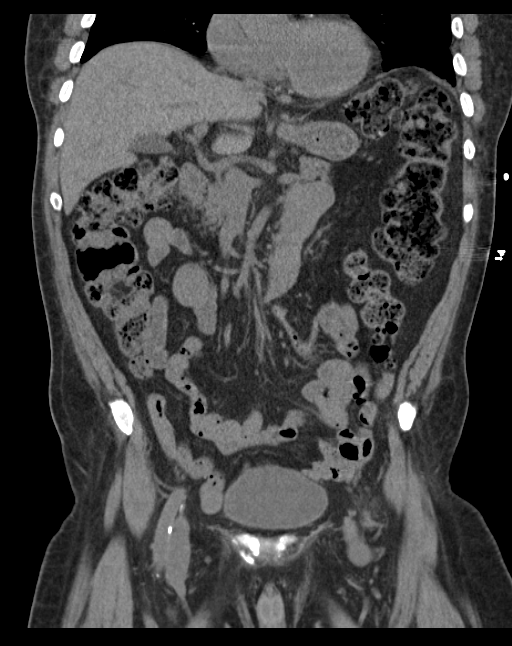

[16 of 34 positions shown; findings below may reference images not displayed]

FINDINGS: Lower chest: Minimal bibasilar atelectasis. Coronary artery
calcifications are present. Heart size is normal. No pericardial
effusion.

Upper abdomen: There are low-attenuation lesions within the kidneys,
consistent with cyst based on density measurements. No focal
abnormality identified within the liver, pancreas, or spleen. The
gallbladder is present.

Gastrointestinal tract: Stomach and small bowel loops are normal in
appearance. The appendix is well seen and has a normal appearance.
There is significant stool burden.

Pelvis: Urinary bladder has a normal appearance. Prostate gland is
normal in size. No pelvic adenopathy.

Retroperitoneum: No retroperitoneal or mesenteric adenopathy. No
evidence for aortic aneurysm.

For

Abdominal wall: There are focal areas of hazy density in the
anterior abdominal subcutaneous fat, of uncertain significance. Has
the patient had recent injections? Ecchymosis or cellulitis could
have a similar appearance. Fat containing umbilical hernia is noted.

Osseous structures: Numerous blastic lesions are identified
throughout the lower thoracic and lumbar spine consistent with
blastic metastases. The left iliac wing is diffusely sclerotic,
consistent with metastasis. No acute fracture.
IMPRESSION: 1. Coronary artery disease.
2. Bilateral renal cyst. No hydronephrosis. No intrarenal or
ureteral stones.
3. Significant stool burden.
4. Normal appendix.
5. Hazy appearance of the anterior abdominal wall subcutaneous fat.
See above.
6. Numerous blastic metastases.

## 2016-05-07 IMAGING — CR DG CHEST 2V
2 series · 2 of 2 positions shown · non-contrast
Comparison: 03/09/2011

CLINICAL DATA: Lower chest pain, generalized abdominal pain,
prostate cancer

EXAM:
CHEST  2 VIEW

[w chest pa]
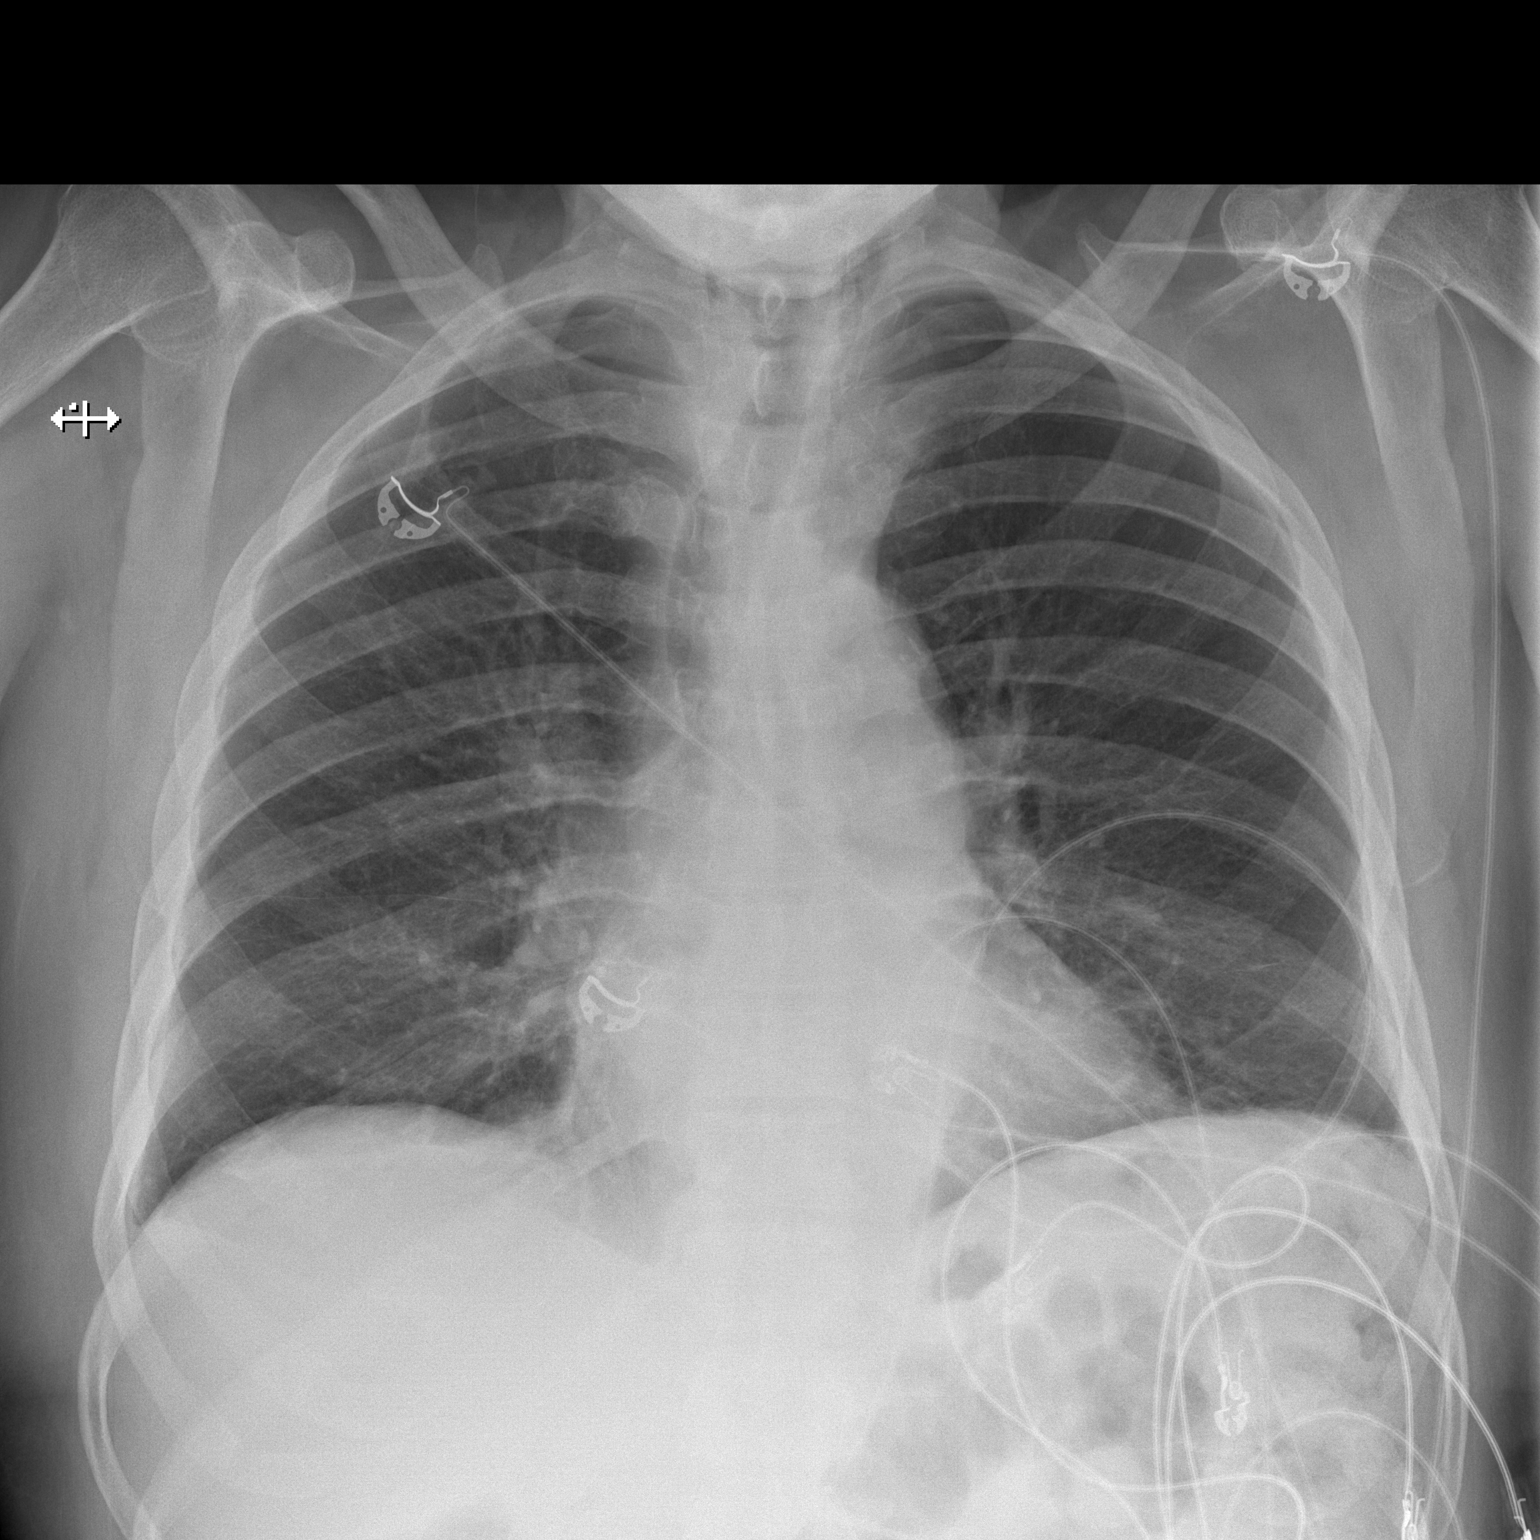

[w chest lat]
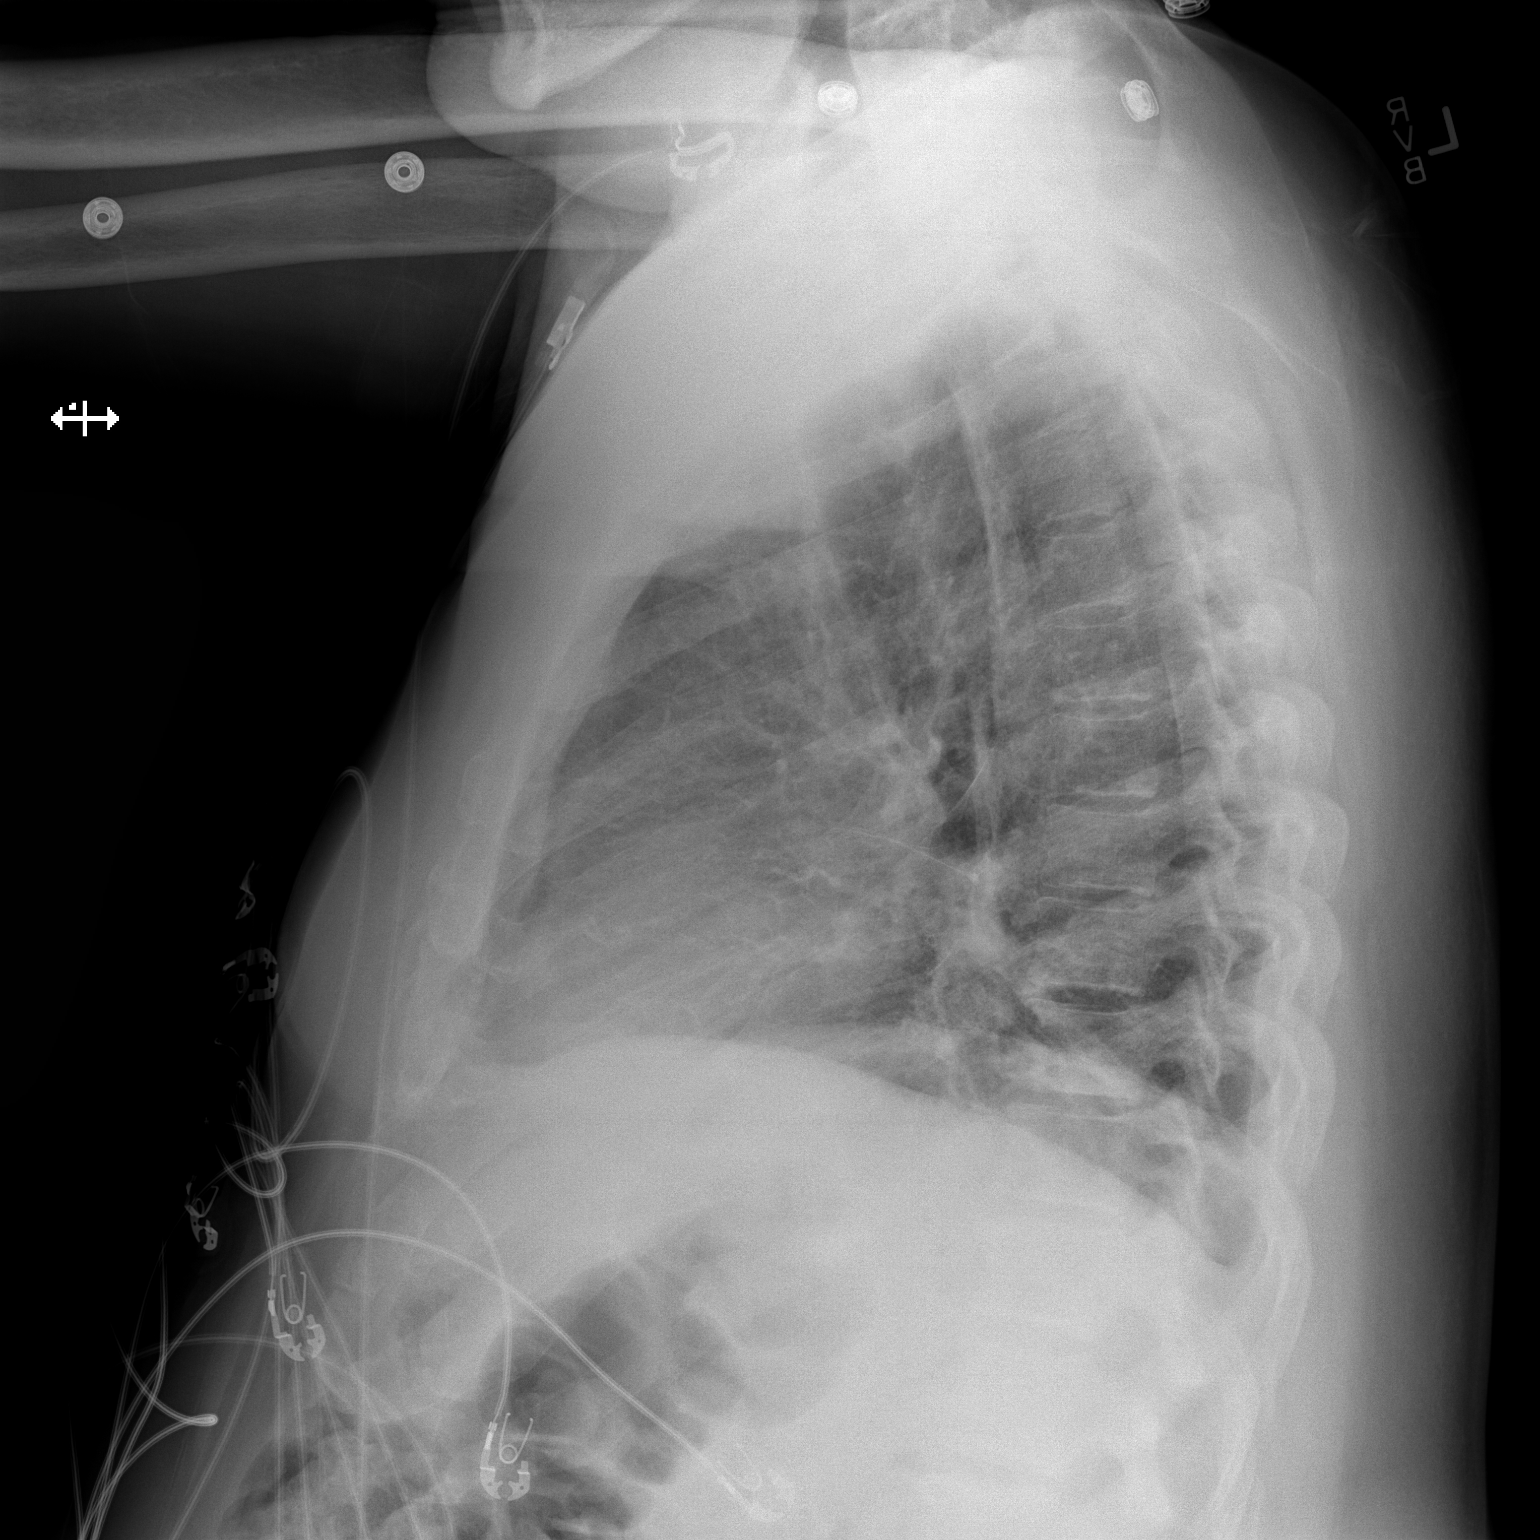

[2 of 2 positions shown; findings below may reference images not displayed]

FINDINGS: Lungs are clear.  No pleural effusion or pneumothorax.

The heart is normal in size.

Visualized osseous structures are within normal limits.
IMPRESSION: No evidence of acute cardiopulmonary disease.

## 2016-12-07 IMAGING — CT CT ABD-PELV W/ CM
2 of 5 series · 15 of 46 positions shown, 17 images · IV contrast (OMNIPAQUE 300)
Comparison: Lower chest: There bilateral pleural effusions.

ADDENDUM:
The salient findings were discussed with NOMASIBULELE MOATSHE on
06/11/2015 at [DATE].
CLINICAL DATA: abdominal pain awoke him this am. Denies n/v/d or
fevers. No BM since this past [REDACTED]. History of prostate cancer.
^100mL OMNIPAQUE IOHEXOL 300 MG/ML SOLN

EXAM:
CT ABDOMEN AND PELVIS WITH CONTRAST
TECHNIQUE: Multidetector CT imaging of the abdomen and pelvis was performed
using the standard protocol following bolus administration of
intravenous contrast.
CONTRAST:  100mL OMNIPAQUE IOHEXOL 300 MG/ML  SOLN

[Series 2: abd/pel with · axial · 0.81mm/px · z∈[+1038,+1444]mm · 12 of 93 slices shown, 14 images]
[im 6/93  soft-tissue]
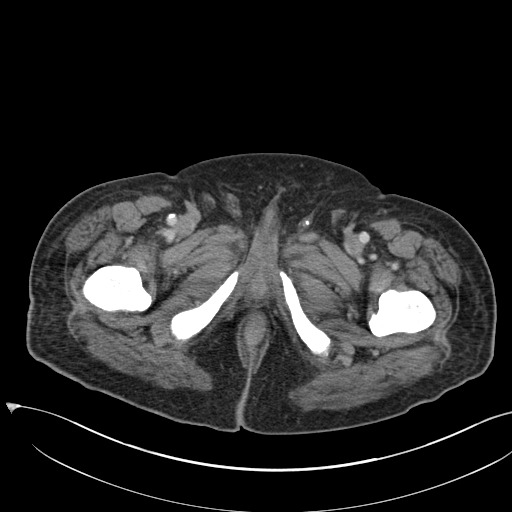
[im 6/93  bone]
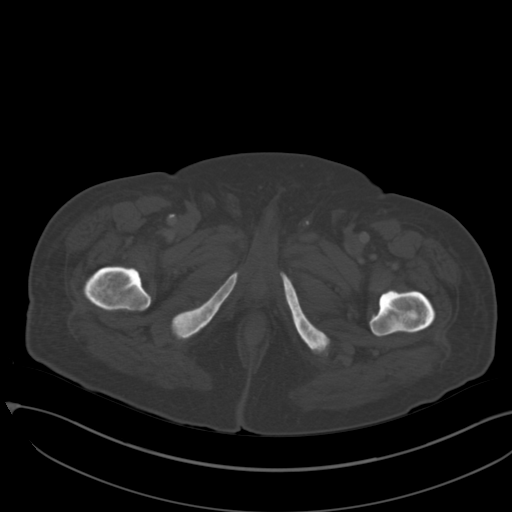
[im 16/93  soft-tissue]
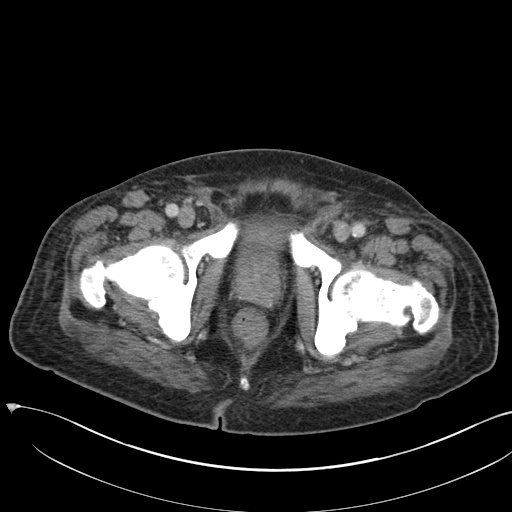
[im 21/93  soft-tissue]
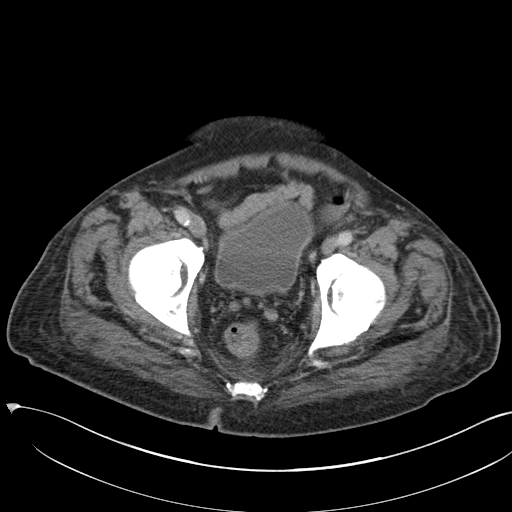
[im 26/93  soft-tissue]
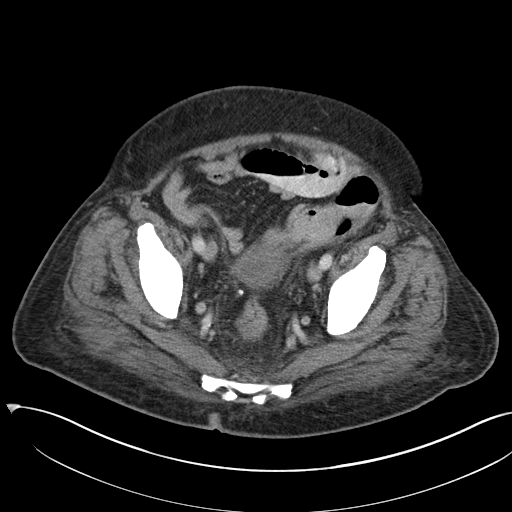
[im 36/93  soft-tissue]
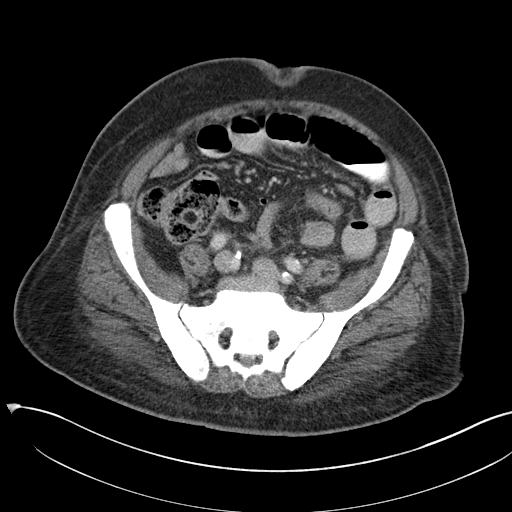
[im 41/93  soft-tissue]
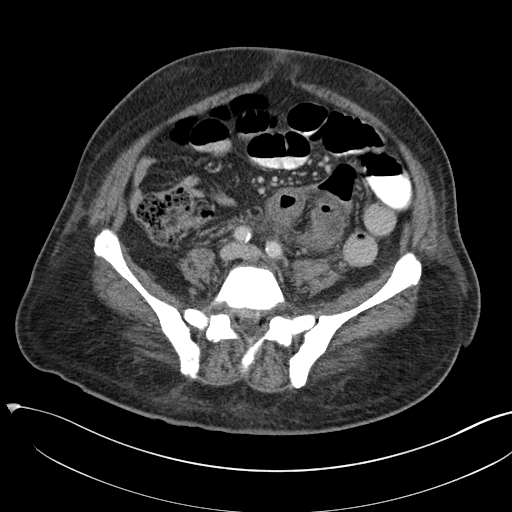
[im 52/93  soft-tissue]
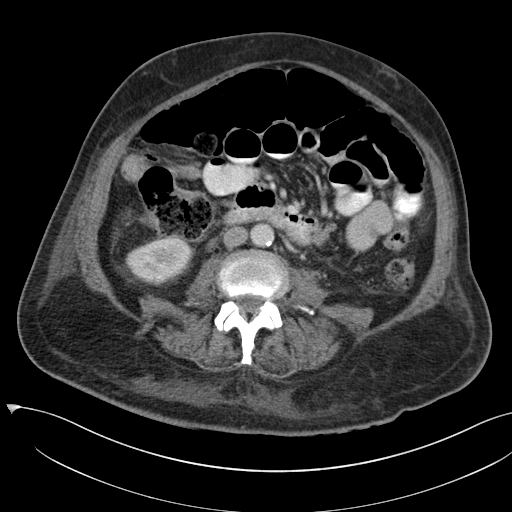
[im 57/93  soft-tissue]
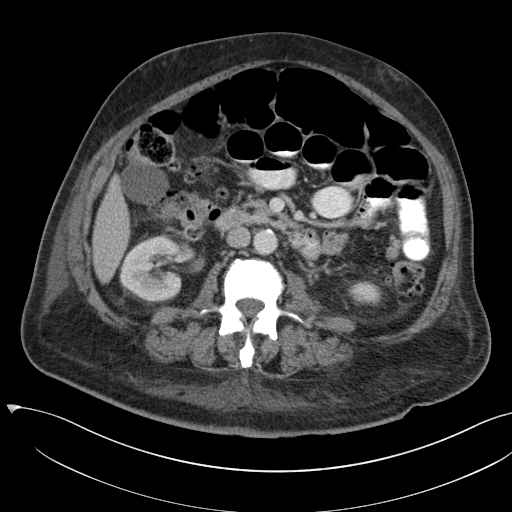
[im 67/93  soft-tissue]
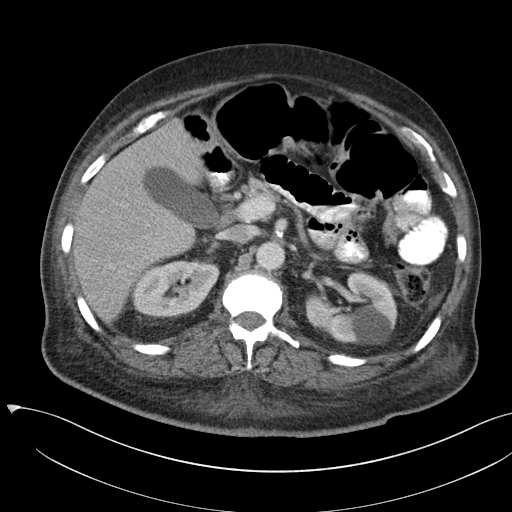
[im 67/93  bone]
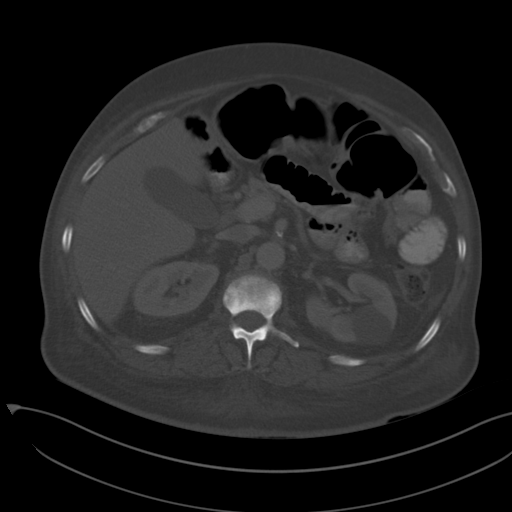
[im 72/93  soft-tissue]
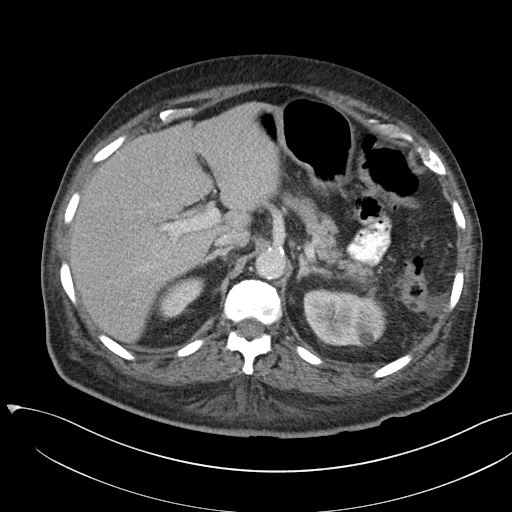
[im 77/93  soft-tissue]
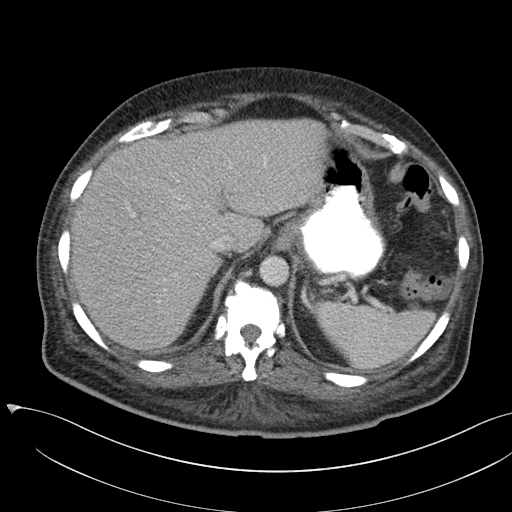
[im 87/93  soft-tissue]
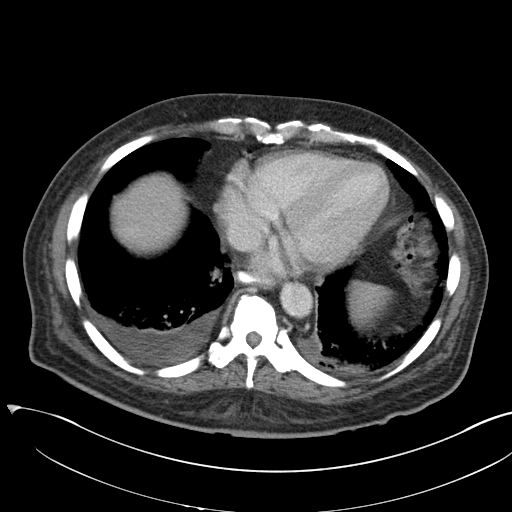

[Series 4: coronal a/|p · coronal · 0.74mm/px · 3 of 117 slices shown]
[im 39/117  soft-tissue]
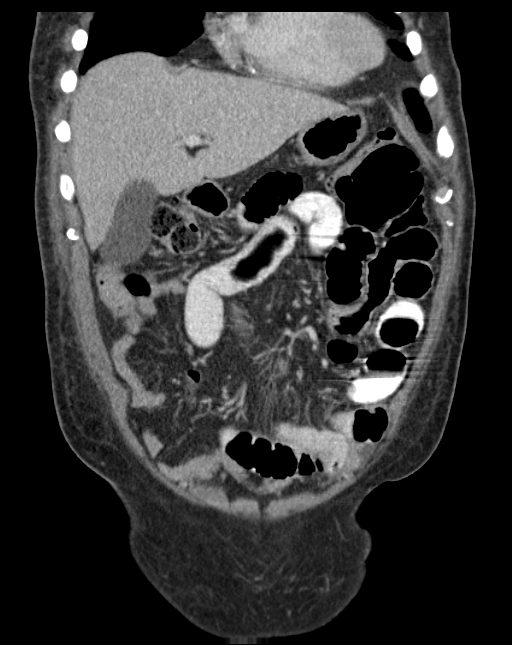
[im 52/117  soft-tissue]
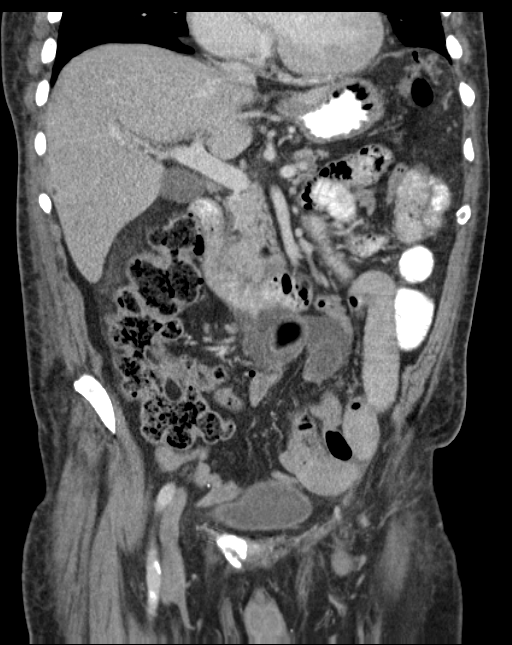
[im 65/117  soft-tissue]
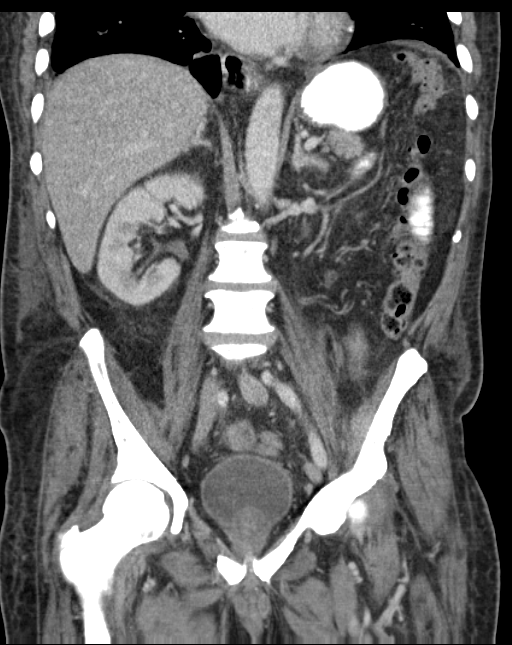

[15 of 46 positions shown; findings below may reference images not displayed]

Bibasilar atelectasis. Coronary artery calcifications are present.
Heart size appears normal.

Upper abdomen: No focal abnormality identified within the liver,
spleen, pancreas, or adrenal glands. Gallbladder is present.
Bilateral renal cysts are present. No intrarenal or ureteral stones.

Gastrointestinal tract: There are numerous dilated proximal small
bowel loops contain contrast. In the left central lower abdomen
there is a non-opacified loop of small bowel with surrounding
mesenteric fluid. Asleep is felt to be the point of obstruction
likely represents a closed loop obstruction. The small bowel loops
distal to this level are relatively collapsed. There are numerous
colonic diverticula but no evidence for acute diverticulitis. The
colon is redundant. The appendix is well seen and has a normal
appearance.

Pelvis: The prostate is enlarged. Urinary bladder otherwise has a
normal appearance. No free pelvic fluid. No pelvic adenopathy.

Retroperitoneum: Atherosclerotic calcification identified within the
aorta.

Abdominal wall: Fat containing umbilical hernia.

Osseous structures: Interval progression of significant osseous
metastatic disease.2 diffuse sclerotic metastases involve the
pelvis, proximal femurs, and all of the visualized vertebrae.
FINDINGS: 1. Findings consistent with closed loop obstruction of the mid to
distal small bowel. Given the presence of surrounding mesenteric
fluid, perforation should be considered. There is no free
intraperitoneal air however.
2. Coronary artery disease.
3. Normal appendix.
4. Colonic diverticulosis.
5. Prostatic enlargement.
6. Umbilical hernia.
7. Progression of diffuse sclerotic metastases.

## 2016-12-07 IMAGING — CR DG ABD PORTABLE 1V
1 series · 1 of 1 positions shown · non-contrast
Comparison: CT of the abdomen and pelvis from the same day.

CLINICAL DATA: NG tube placement

EXAM:
PORTABLE ABDOMEN - 1 VIEW

[ap (kub)]
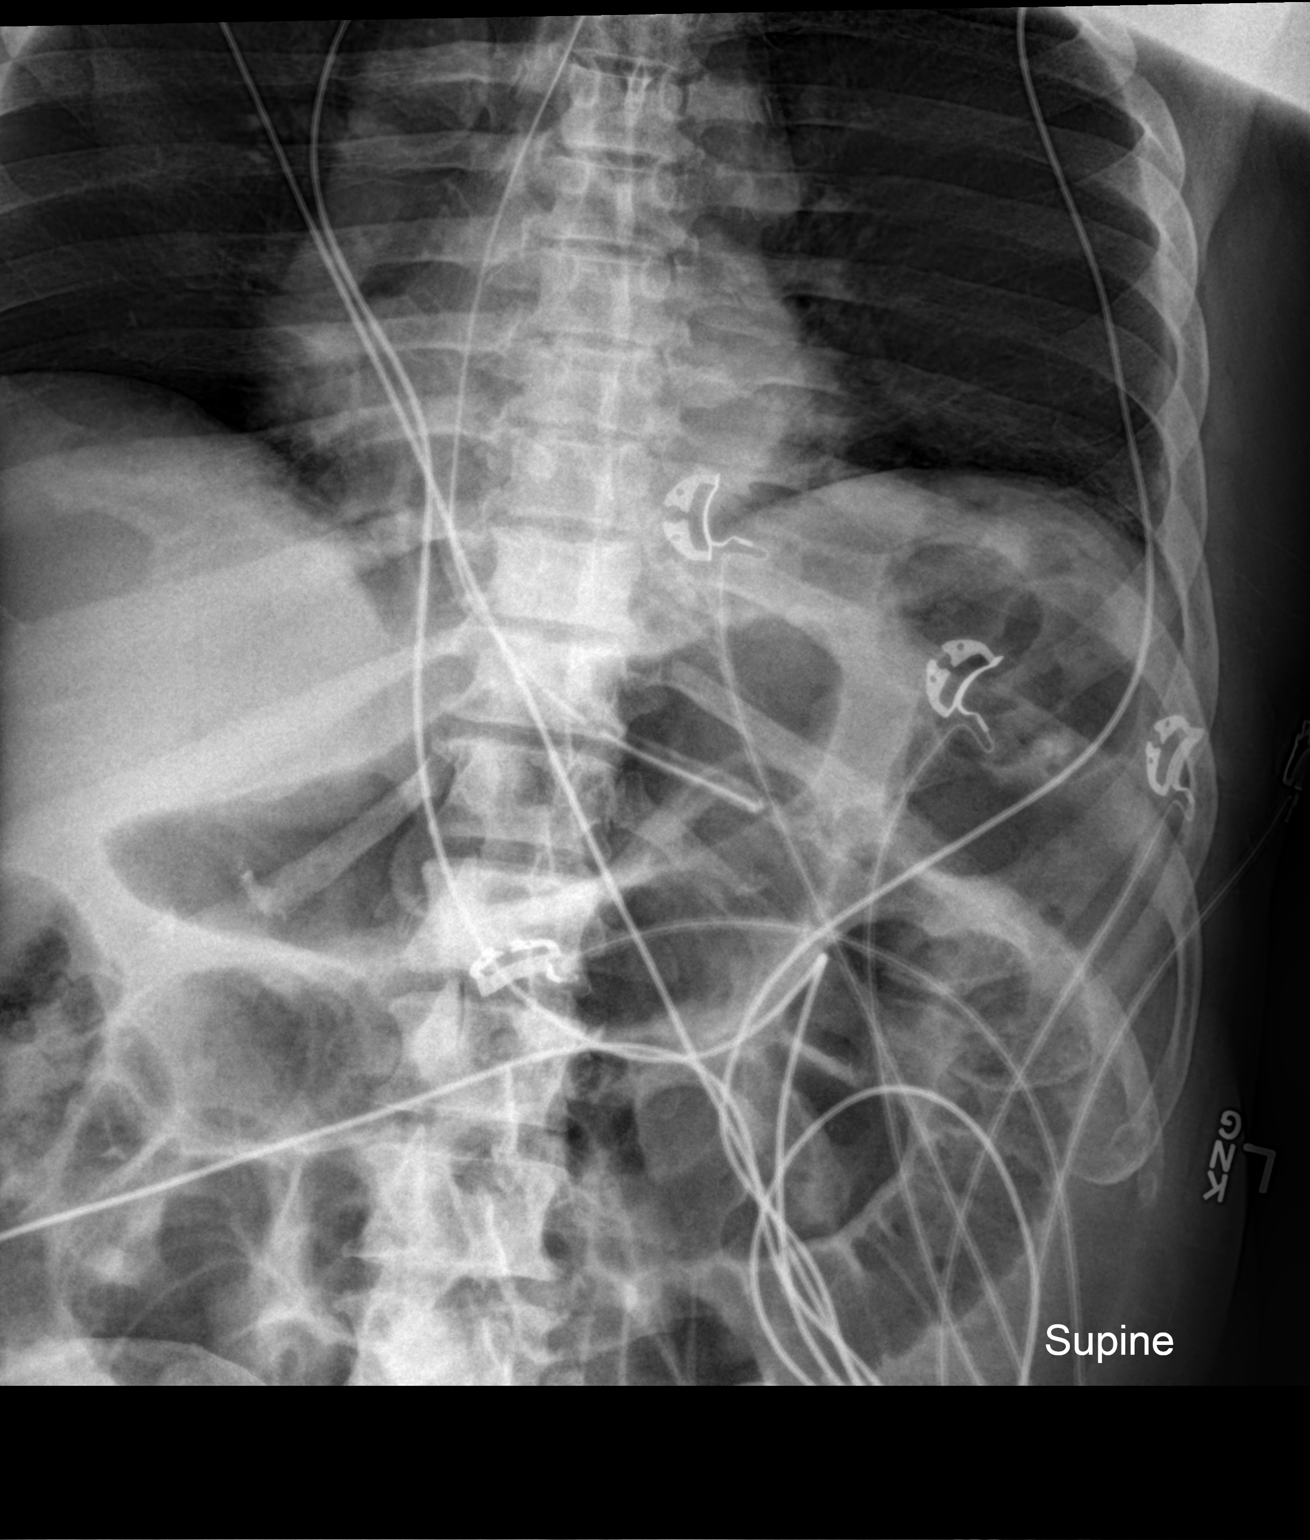

[1 of 1 positions shown; findings below may reference images not displayed]

FINDINGS: Side port of an NG tube is just beyond the GE junction. Lung bases
are clear. Multiple dilated loops of small bowel are again seen.
IMPRESSION: 1. The side port of the NG tube is just beyond the GE junction and
could be advanced several cm for more optimal positioning.
2. Persistent dilation of multiple loops of small bowel.

## 2016-12-08 IMAGING — DX DG CHEST 1V PORT
1 series · 1 of 1 positions shown · non-contrast
Comparison: November 09, 2014.

CLINICAL DATA: Aspiration of vomitus, initial encounter.

EXAM:
PORTABLE CHEST 1 VIEW

[chest ap]
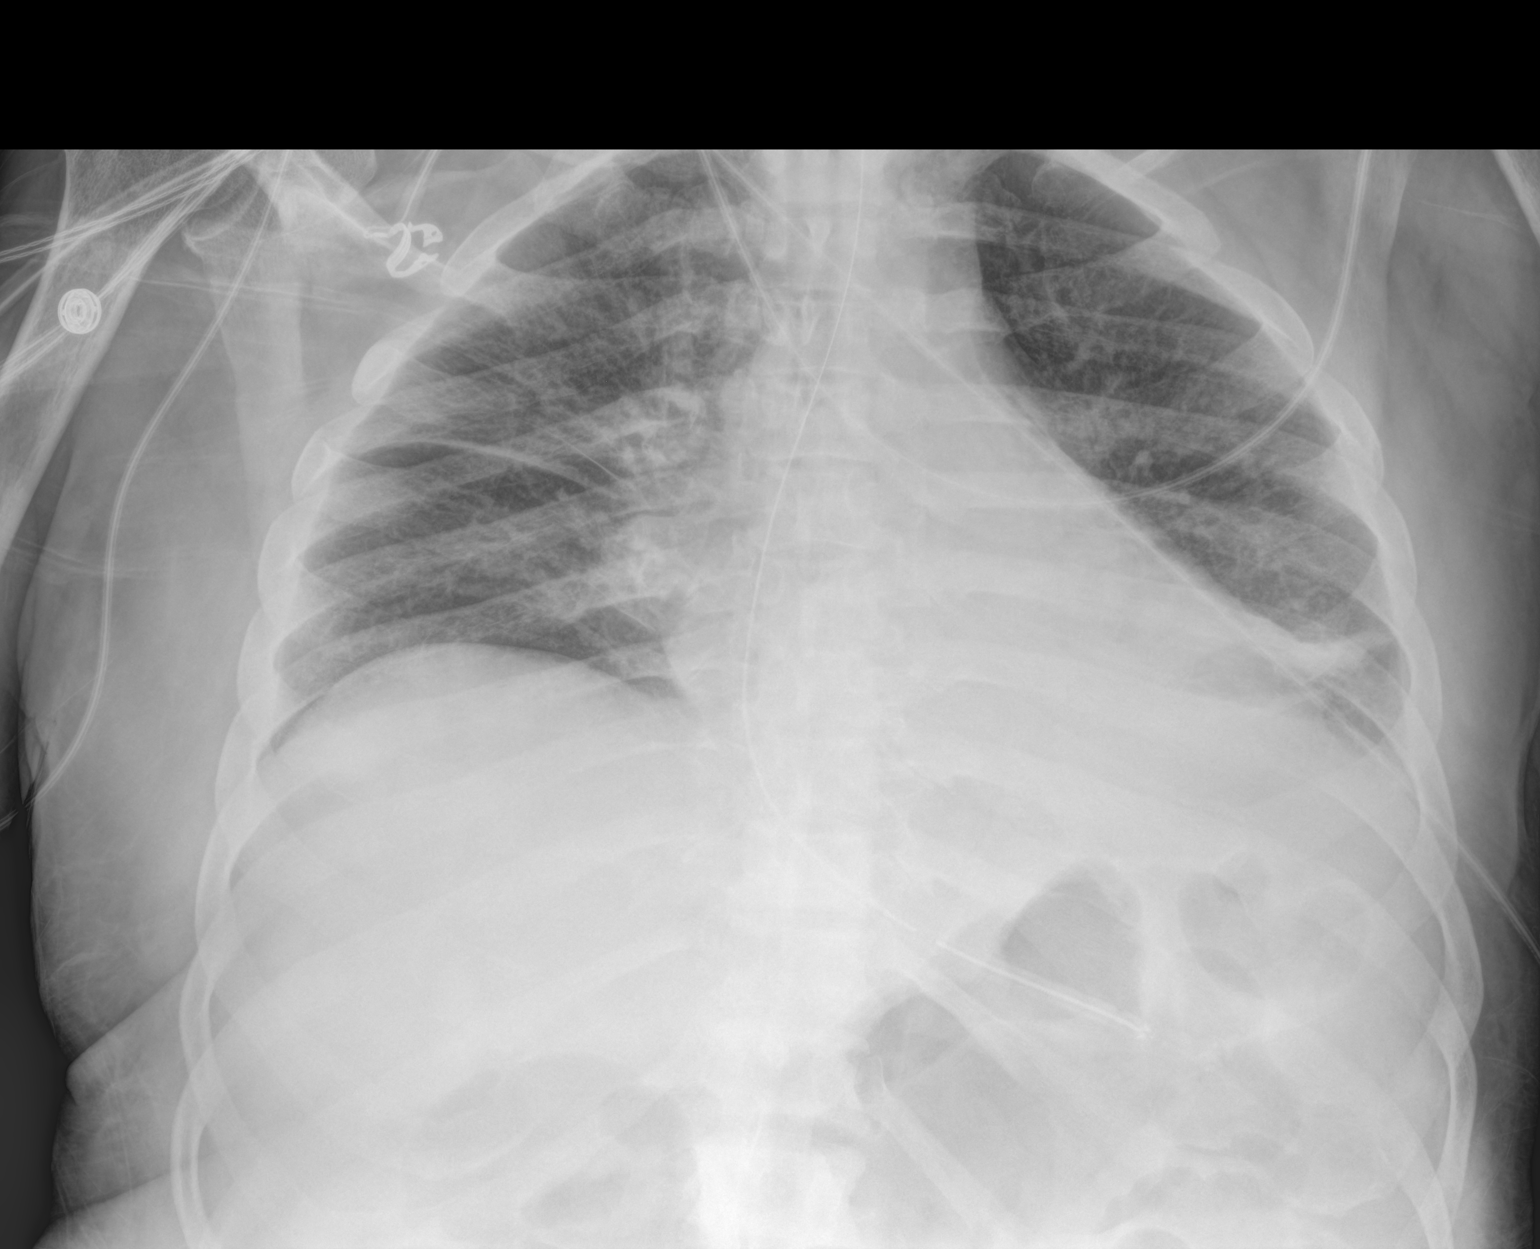

[1 of 1 positions shown; findings below may reference images not displayed]

FINDINGS: Stable cardiomediastinal silhouette. Hypoinflation of the lungs is
noted. Nasogastric tube tip is seen in proximal stomach. No
pneumothorax is noted. Mild central pulmonary vascular congestion is
noted. Small amount of fluid is noted in the right minor fissure.
Left basilar opacity is noted concerning for pneumonia or
atelectasis with associated pleural effusion. Bony thorax is
unremarkable.
IMPRESSION: Hypoinflation of the lungs is noted. Mild central pulmonary vascular
congestion is noted. Left basilar opacity is noted concerning for
pneumonia or atelectasis with associated pleural effusion.

## 2016-12-08 IMAGING — DX DG ABD PORTABLE 1V
1 series · 1 of 1 positions shown · non-contrast
Comparison: Yesterday

CLINICAL DATA: Nasogastric tube placement

EXAM:
PORTABLE ABDOMEN - 1 VIEW

[abdomen kub]
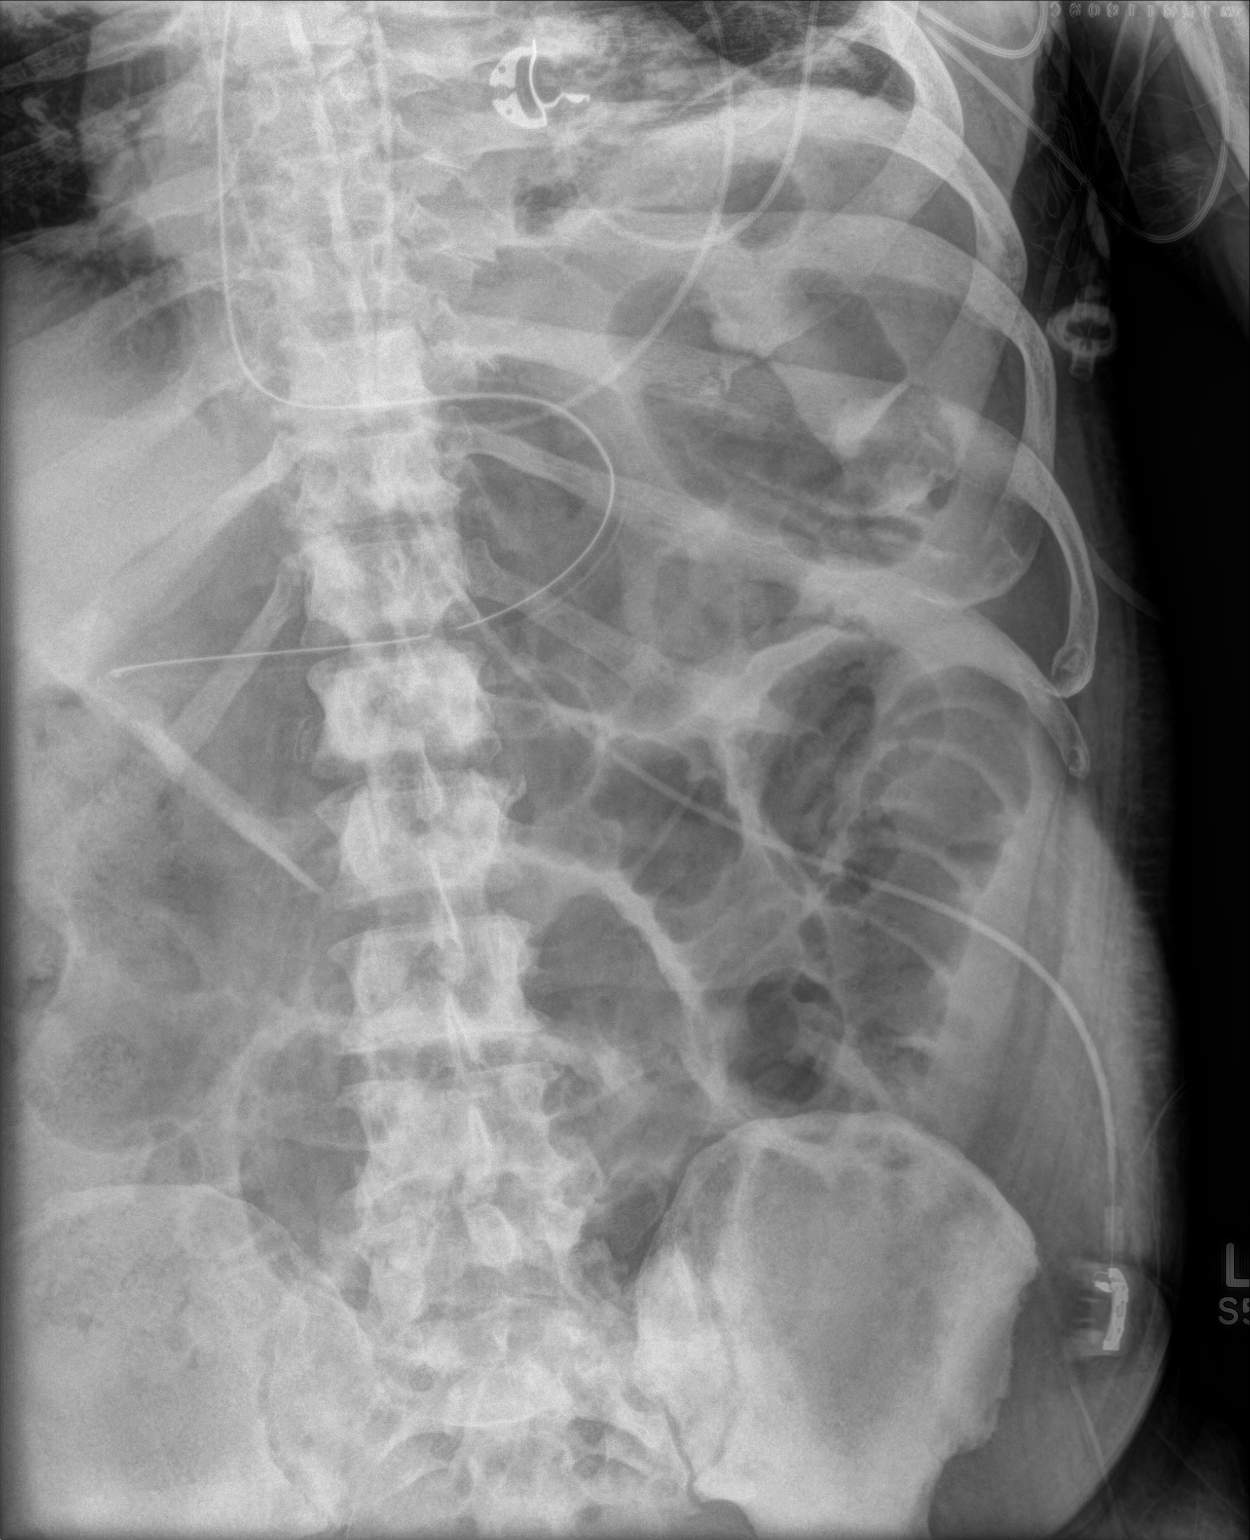

[1 of 1 positions shown; findings below may reference images not displayed]

FINDINGS: Advancement of nasogastric tube with tip in the peripatellar region.

Unchanged dilation of small bowel.

There is a new lucency over the epigastrium and right abdomen which
may outline the falciform ligament and the outer wall of a small
bowel loop.

Critical Value/emergent results were called by telephone at the time
of interpretation on 06/12/2015 at [DATE] to Dr. ASINAKU ROHOMAN , who
verbally acknowledged these results.
IMPRESSION: 1. Possible new pneumoperitoneum. Depending on exam, confirmation
could be obtained by CT or upright/decubitus imaging.
2. Advanced nasogastric tube with tip at the pylorus.
3. Unchanged small bowel distention.

## 2016-12-11 IMAGING — DX DG CHEST 2V
2 series · 2 of 2 positions shown · non-contrast
Comparison: Three days ago

CLINICAL DATA: Wheezing on expiration

EXAM:
CHEST  2 VIEW

[chest pa]
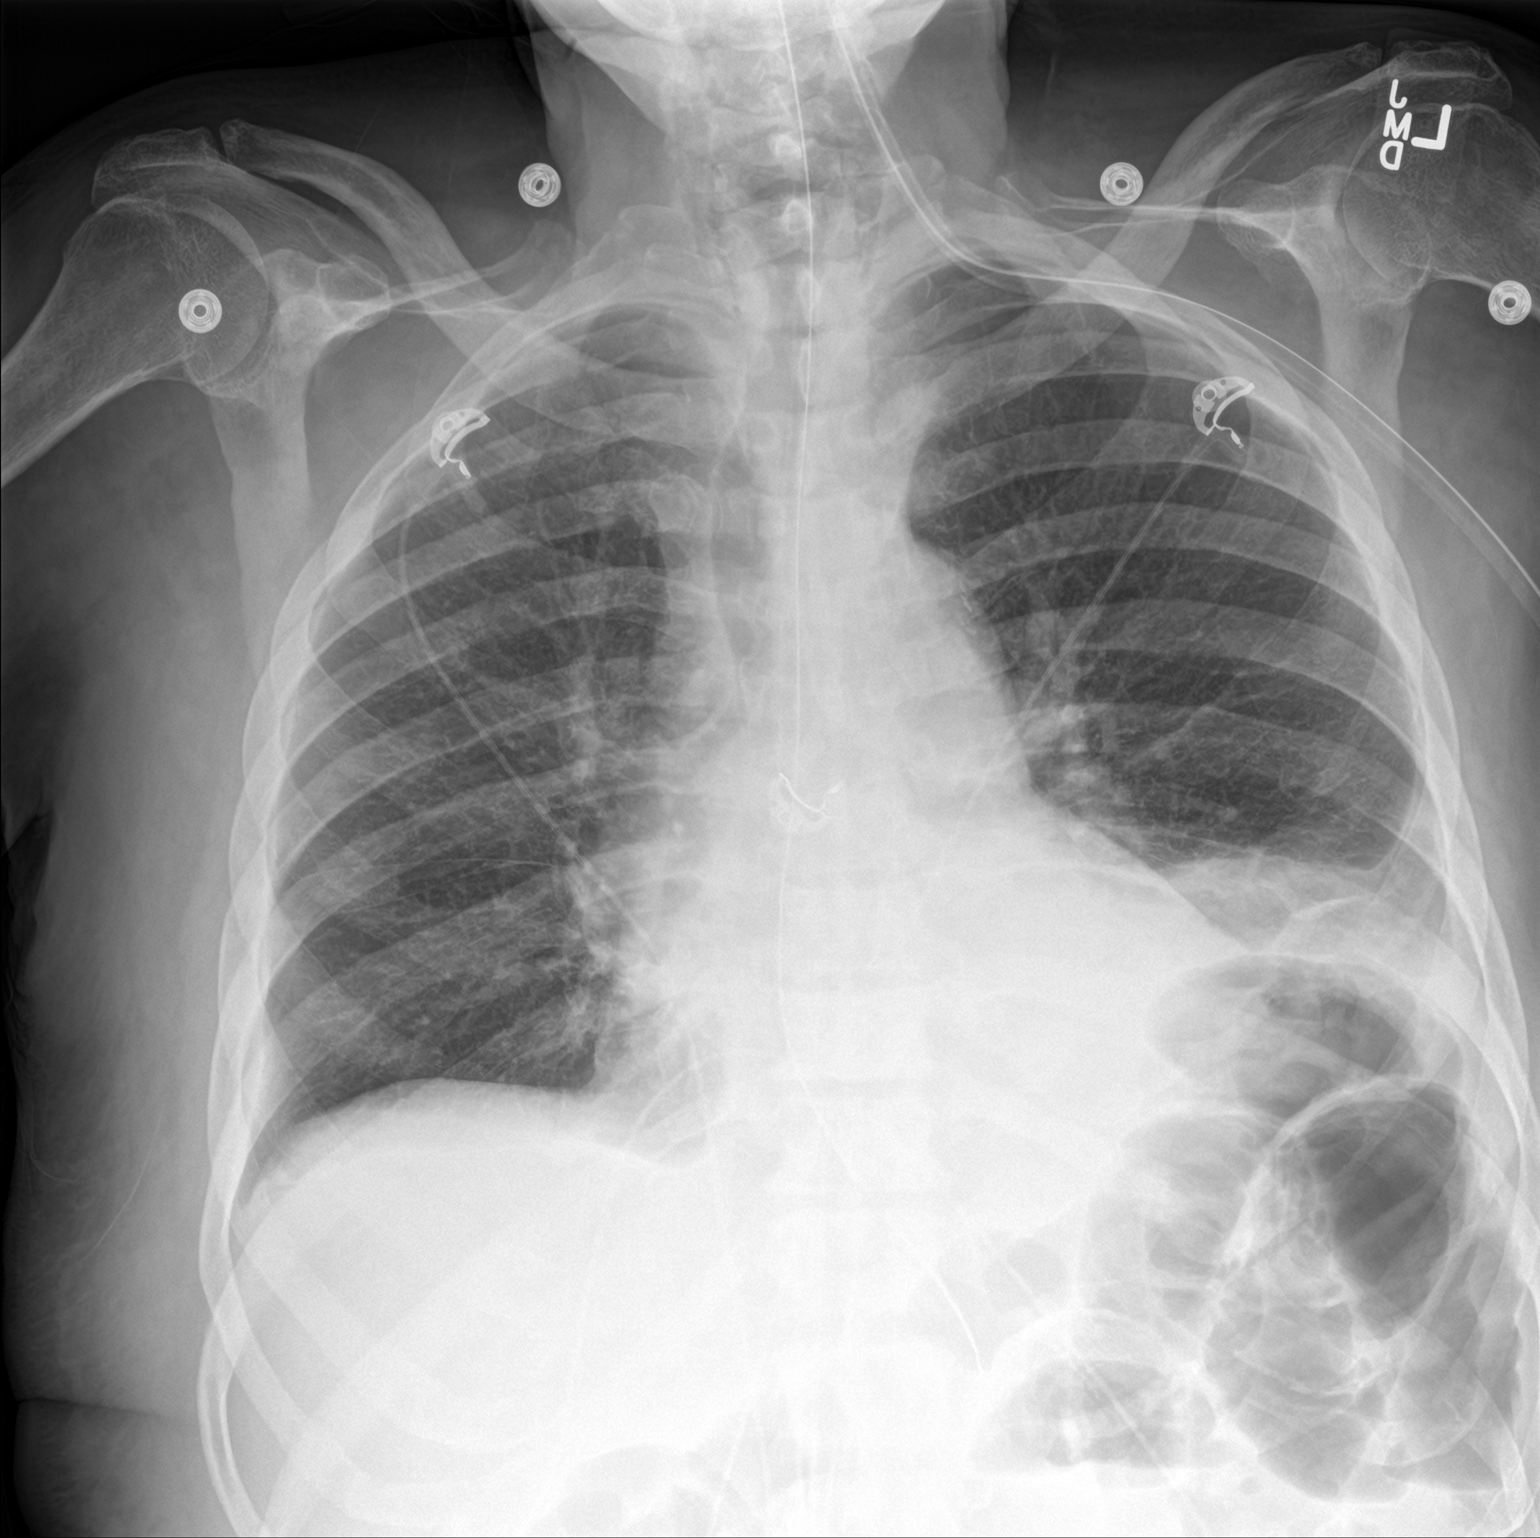

[chest lat]
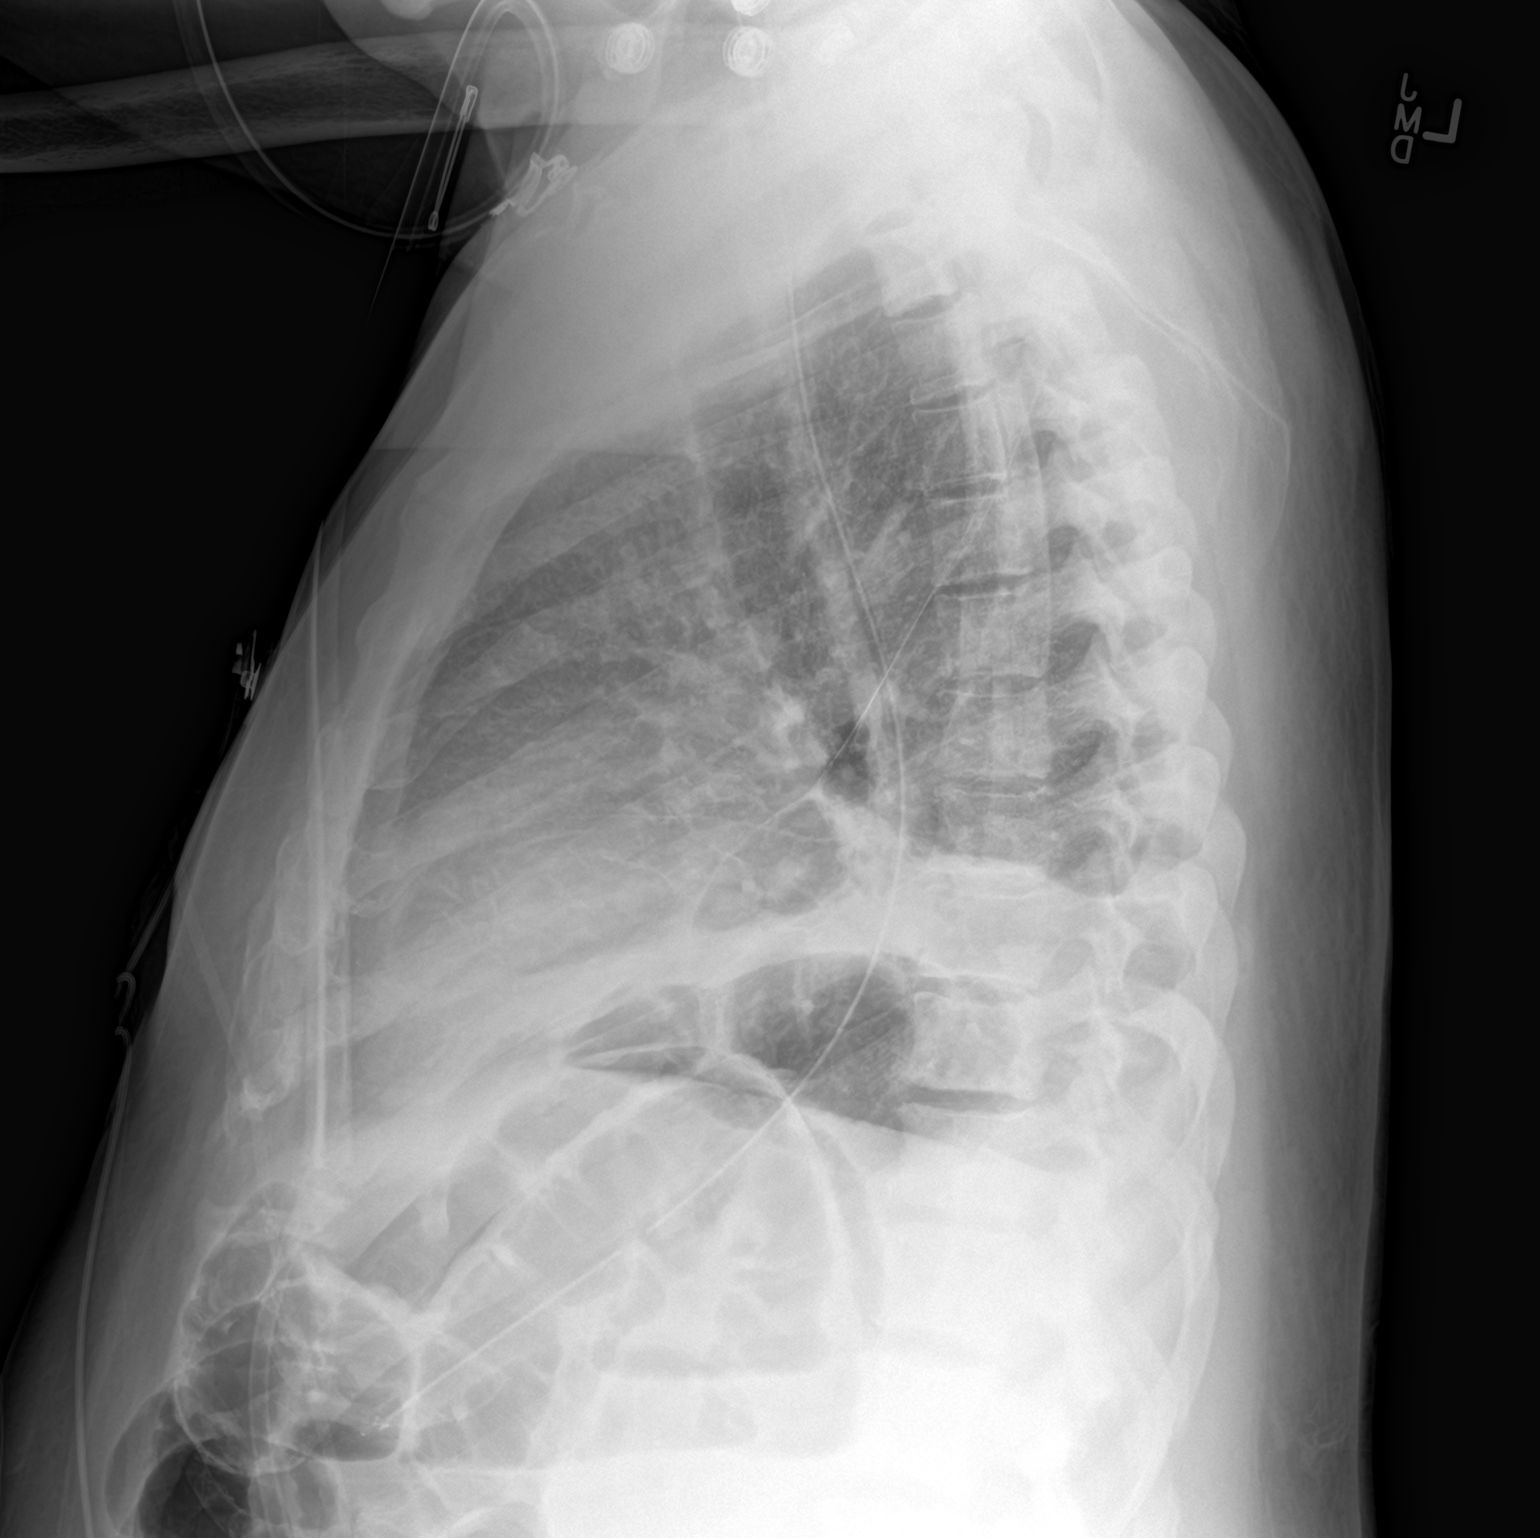

[2 of 2 positions shown; findings below may reference images not displayed]

FINDINGS: Improved lung volumes in general, but still with bandlike opacity
over the left base consistent with atelectasis. The left diaphragm
is elevated. Trace pleural effusions. There is also diffuse
interstitial coarsening. Chronic cardiomegaly. Stable aortic and
hilar contours. Nasogastric tube tip at the proximal stomach.
IMPRESSION: 1. Multi segment left basilar atelectasis.
2. Pulmonary venous congestion and trace effusions.
3. Shorter nasogastric tube with tip at the proximal stomach.

## 2016-12-15 IMAGING — CT CT ABD-PELV W/ CM
3 of 11 series · 15 of 37 positions shown, 16 images · IV contrast (omnipaque)
Comparison: Chest radiographs earlier this day. CT abdomen/pelvis
06/11/2015

CLINICAL DATA: Inpatient hospitalized with central abdominal
cramping and constipation. Post laparotomy and bowel resection 1
week prior. Today with fever and tachycardia. Patient with history
of prostate cancer with bony metastasis.

EXAM:
CT ANGIOGRAPHY CHEST
CT ABDOMEN AND PELVIS WITH CONTRAST
TECHNIQUE: Multidetector CT imaging of the chest was performed using the
standard protocol during bolus administration of intravenous
contrast. Multiplanar CT image reconstructions and MIPs were
obtained to evaluate the vascular anatomy. Multidetector CT imaging
of the abdomen and pelvis was performed using the standard protocol
during bolus administration of intravenous contrast.
CONTRAST:  100mL OMNIPAQUE IOHEXOL 350 MG/ML SOLN

[Series 4: pe @ 3mm · axial · 0.81mm/px · z∈[+1234,+1348]mm · 3 of 76 slices shown]
[im 19/76  lung]
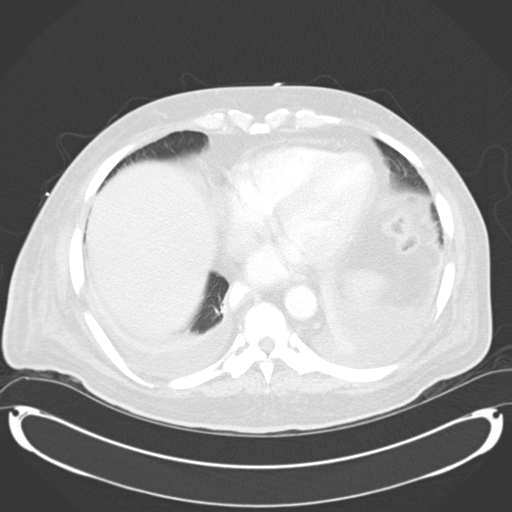
[im 38/76  lung]
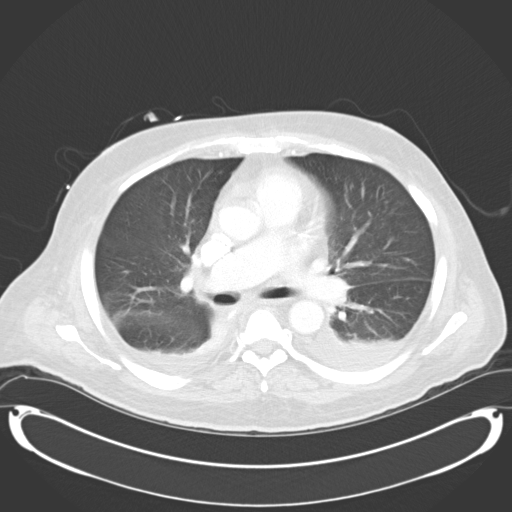
[im 57/76  lung]
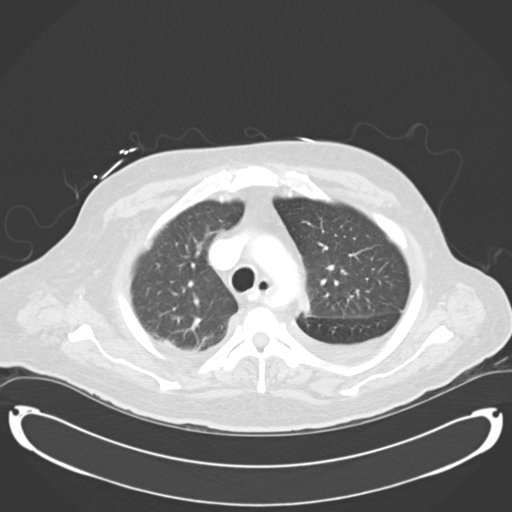

[Series 6: pe thins @ 1mm · axial · 0.81mm/px · z∈[+1196,+1387]mm · 8 of 227 slices shown]
[im 18/227  lung]
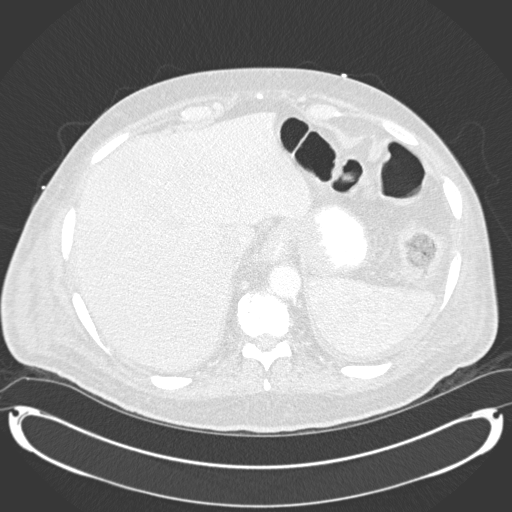
[im 53/227  lung]
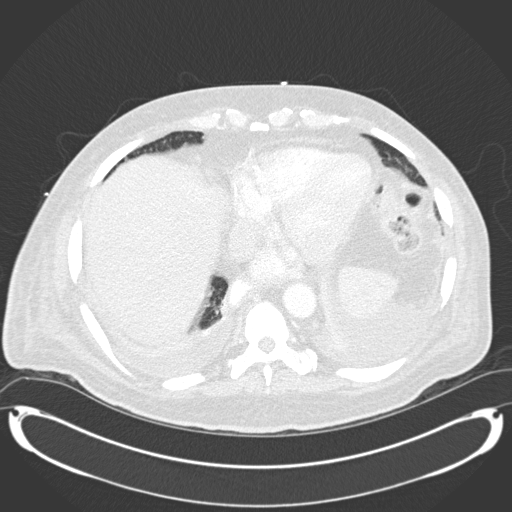
[im 70/227  lung]
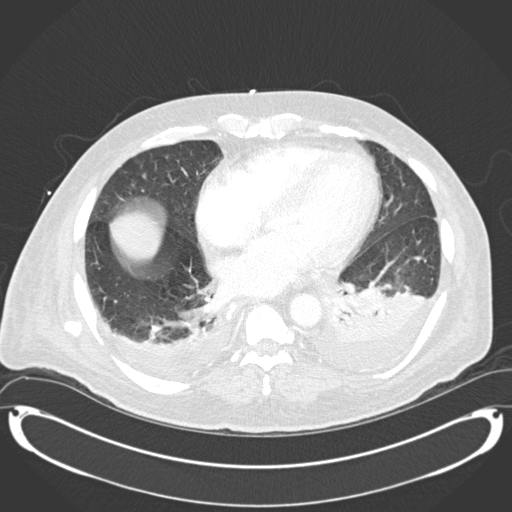
[im 105/227  lung]
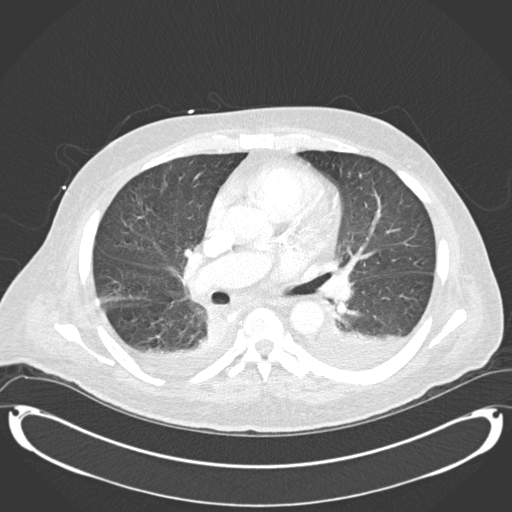
[im 122/227  lung]
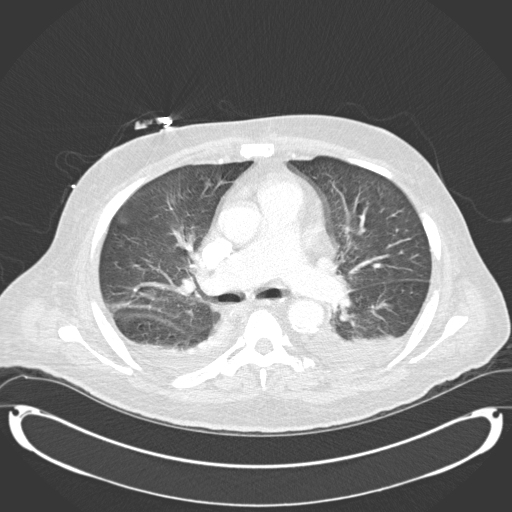
[im 157/227  lung]
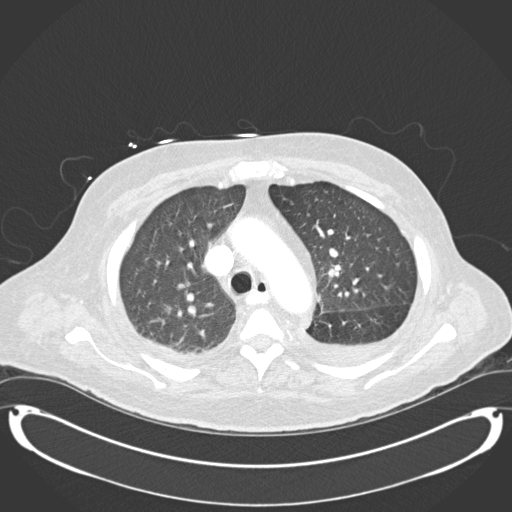
[im 174/227  lung]
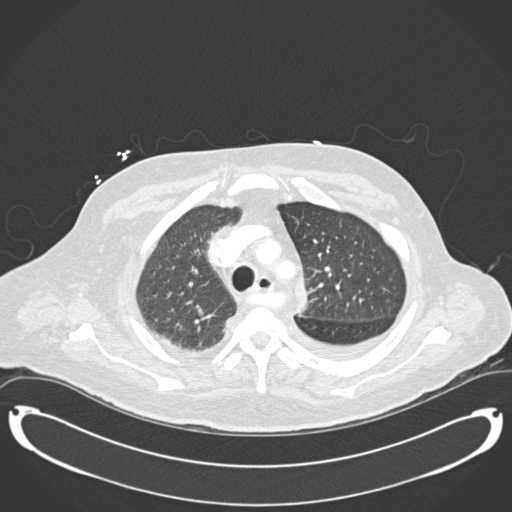
[im 209/227  lung]
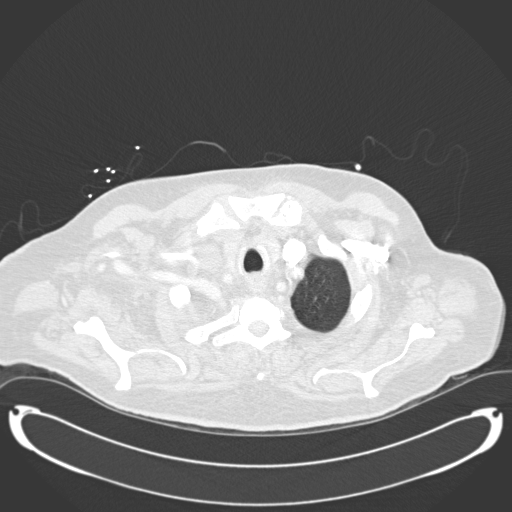

[Series 7: rtn a/p with · axial · 0.81mm/px · z∈[+868,+1183]mm · 4 of 106 slices shown, 5 images]
[im 22/106  mediastinal]
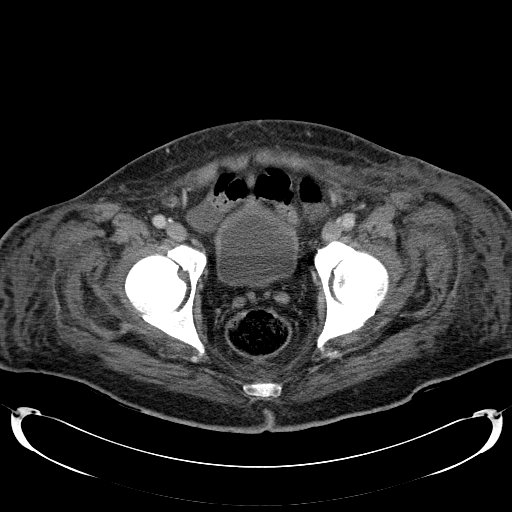
[im 22/106  lung]
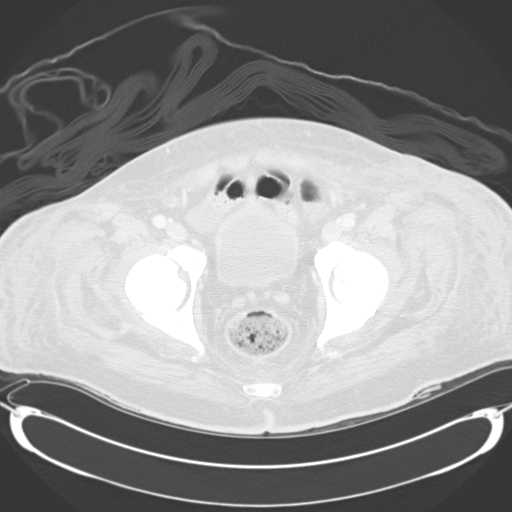
[im 43/106  lung]
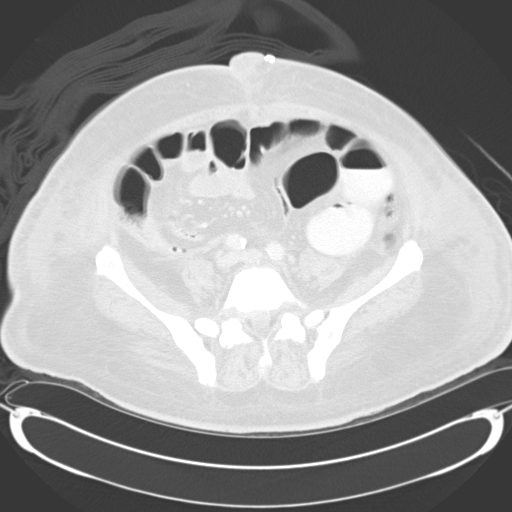
[im 64/106  lung]
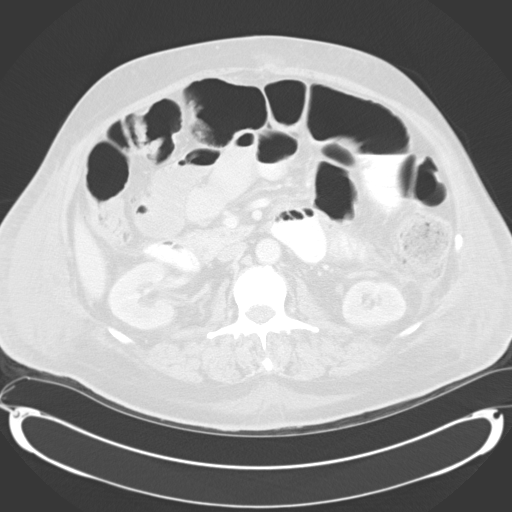
[im 85/106  lung]
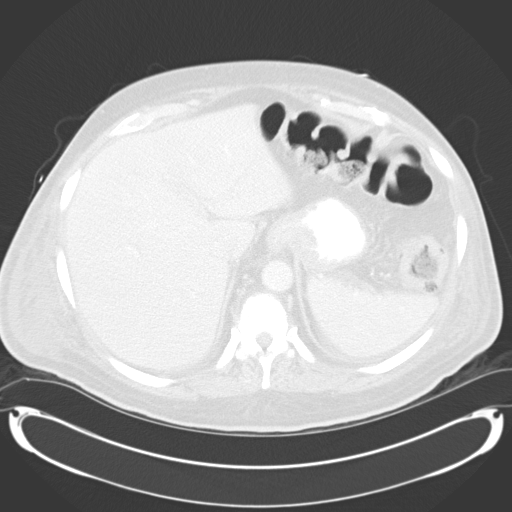

[15 of 37 positions shown; findings below may reference images not displayed]

FINDINGS: CTA CHEST FINDINGS

Vascular: Suboptimal contrast bolus for evaluation of pulmonary
embolus, there are no filling defects in the main, right, left, or
lobar pulmonary arteries. Segmental and subsegmental branches cannot
be assessed, particularly in the lower lobes where there is motion
artifact.

Normal caliber thoracic aorta. There is an aberrant right subclavian
artery coursing posterior to the trachea and esophagus. Minimal
multi chamber cardiomegaly with coronary artery calcifications.

Lungs/pleura: Small bilateral pleural effusions, unchanged on the
right and increased on the left from prior abdominal CT. There is
adjacent compressive atelectasis at both lung bases, left greater
than right. Air bronchograms on the left, with question of
superimposed pneumonia. Breathing motion artifact partially obscures
detailed lung evaluation.

Mediastinum/lymph nodes: Prominent AP window lymph node measures
cm short axis dimension. There is otherwise no mediastinal or hilar
adenopathy. No pericardial effusion.

There is a right upper extremity PICC, with tip in the distal SVC.
Enteric contrast within the esophagus which is mildly tortuous.

Osseous structures: Multiple sclerotic foci throughout bilateral
ribs, thoracic spine, and sternum consistent with known osseous
metastatic disease.

CT ABDOMEN and PELVIS FINDINGS

Liver: No focal hepatic lesion.

Hepatobiliary: Gallbladder physiologically distended. No calcified
gallstone.

Pancreas: No ductal dilatation or inflammation.

Spleen: Normal.

Adrenal glands: No nodule.

Kidneys: Symmetric renal enhancement and excretion. No
hydronephrosis. Bilateral renal cysts again seen.

Stomach/Bowel: Stomach physiologically distended with enteric
contrast, there is wall thickening involving the fundus. Fat density
lesion involving the distal gastric body measures 1.6 x 1.4 cm,
unchanged, query intramural lipoma. Additional fat density lesion in
the region of pylorus is also unchanged. Contrast filled prominent
proximal small bowel loops without transition point. Fluid-filled
and distal small bowel loops, with enteric chain sutures in the
right lower quadrant. Colon is tortuous with colonic diverticulosis,
no diverticulitis. Appendix remains in situ.

Vascular/Lymphatic: No retroperitoneal adenopathy. Abdominal aorta
is normal in caliber. Atherosclerosis of the abdominal aorta and its
branches.

Reproductive: Prostate gland is enlarged causing mass effect on the
bladder base.

Bladder: Minimally distended with mild wall thickening, likely
related to degree of distention.

Other: Post laparotomy with midline skin staples. No subcutaneous
fluid collection. No pneumoperitoneum or free air in the abdomen.
Small amount of dependent free fluid the pelvis in mesentery. Whole
body wall edema most consistent with third-spacing. Scattered foci
of air in the anterior abdominal wall likely related subcutaneous
injections.

Musculoskeletal: Diffuse multifocal scleroses throughout the
included skeleton consistent with osseous metastatic disease.

Review of the MIP images confirms the above findings.
IMPRESSION: 1. Suboptimal contrast bolus for evaluation of pulmonary embolus,
allowing for this, no central pulmonary embolus is seen involving
the central and lobar pulmonary arteries.
2. Bilateral pleural effusions, increased on the left from prior.
There is associated compressive atelectasis bilaterally, the
presence of air bronchograms on the left may reflect superimposed
pneumonia.
3. Prominent contrast and fluid-filled small bowel without
transition point, suspect postoperative ileus. Recurrent small bowel
obstruction is felt less likely.
4. Additional chronic findings as described.

## 2016-12-15 IMAGING — CR DG CHEST 2V
1 series · 1 of 1 positions shown · non-contrast
Comparison: Four days ago

CLINICAL DATA: Fever and weakness

EXAM:
CHEST  2 VIEW

[view not recorded]
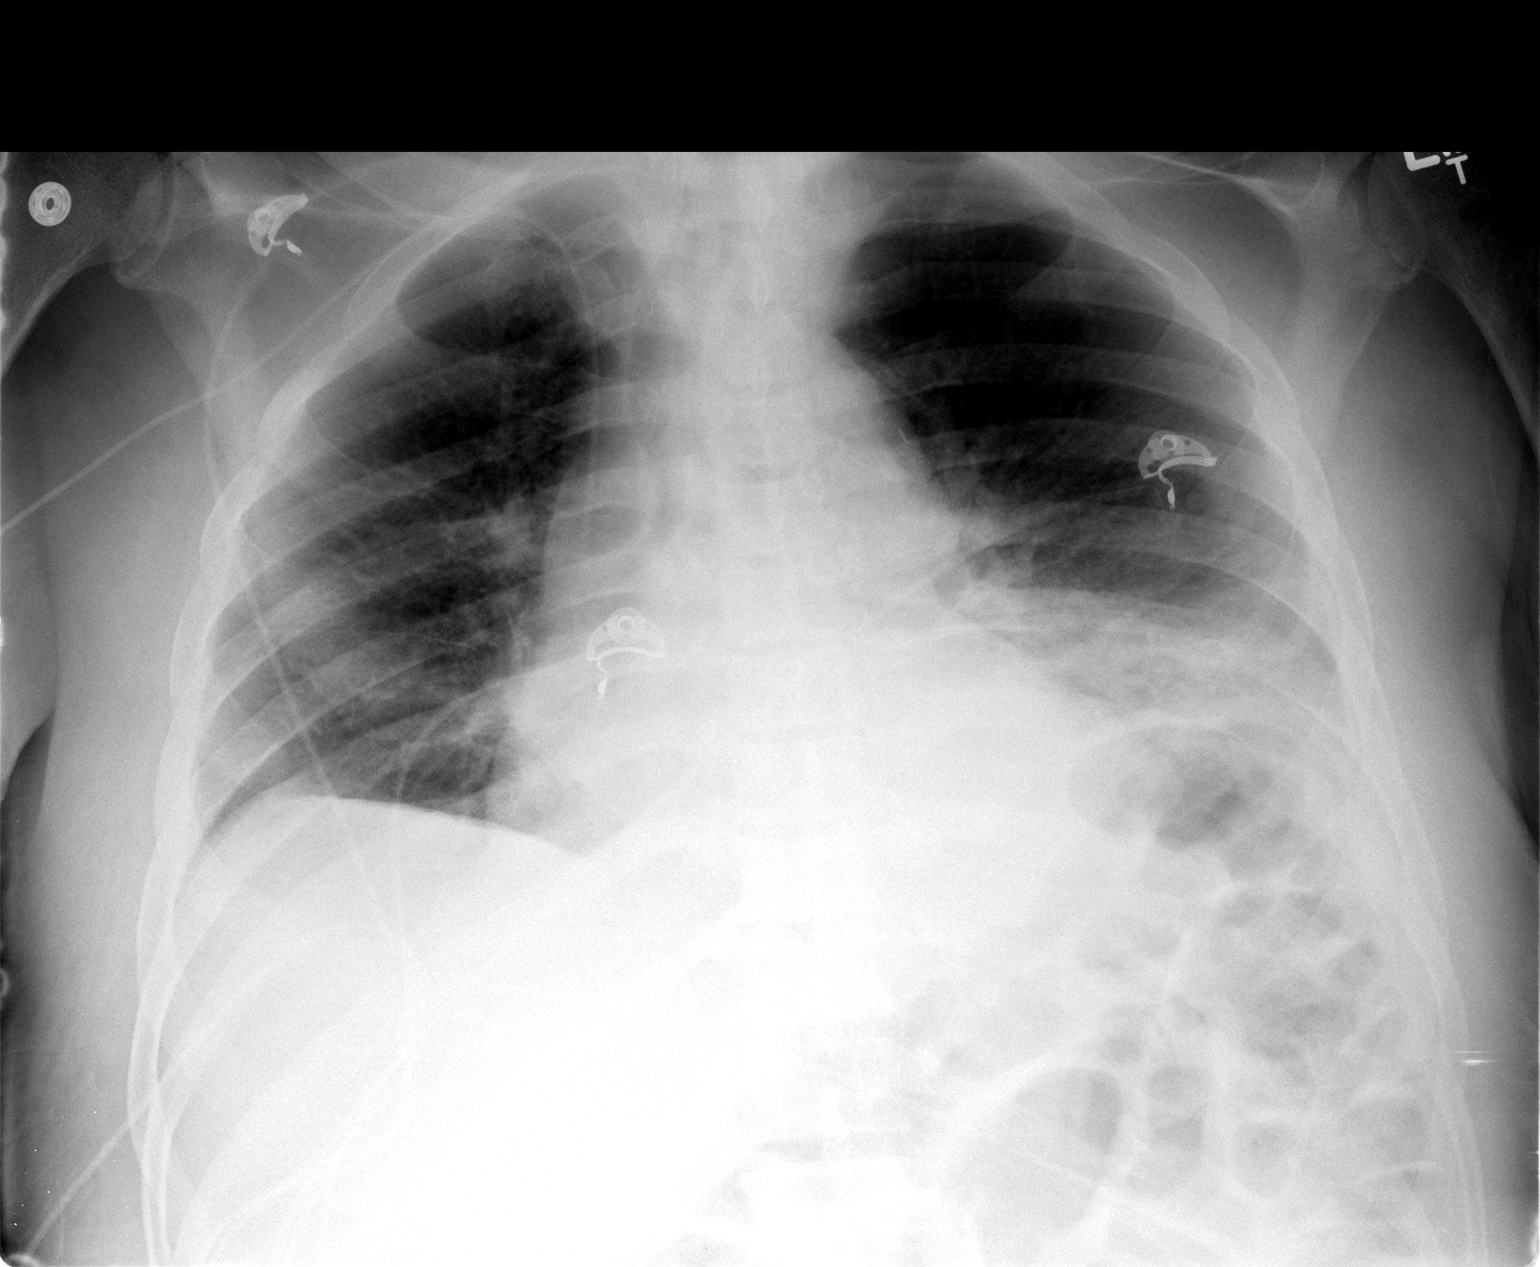

[1 of 1 positions shown; findings below may reference images not displayed]

FINDINGS: Persistent opacity at the left base with elevated diaphragm. Right
upper extremity PICC has been placed, tip at the SVC. Normal heart
size for technique. Stable upper mediastinal contours. No pulmonary
edema or pleural effusion.
IMPRESSION: 1. Persistent left basilar atelectasis. Given fever history, note
that there could be superimposed pneumonia.
2. New right upper extremity PICC is in good position.

## 2016-12-19 IMAGING — DX DG CHEST 1V PORT
1 series · 1 of 1 positions shown · non-contrast
Comparison: 06/23/2015

CLINICAL DATA: ET tube repositioning

EXAM:
PORTABLE CHEST 1 VIEW

[chest ap]
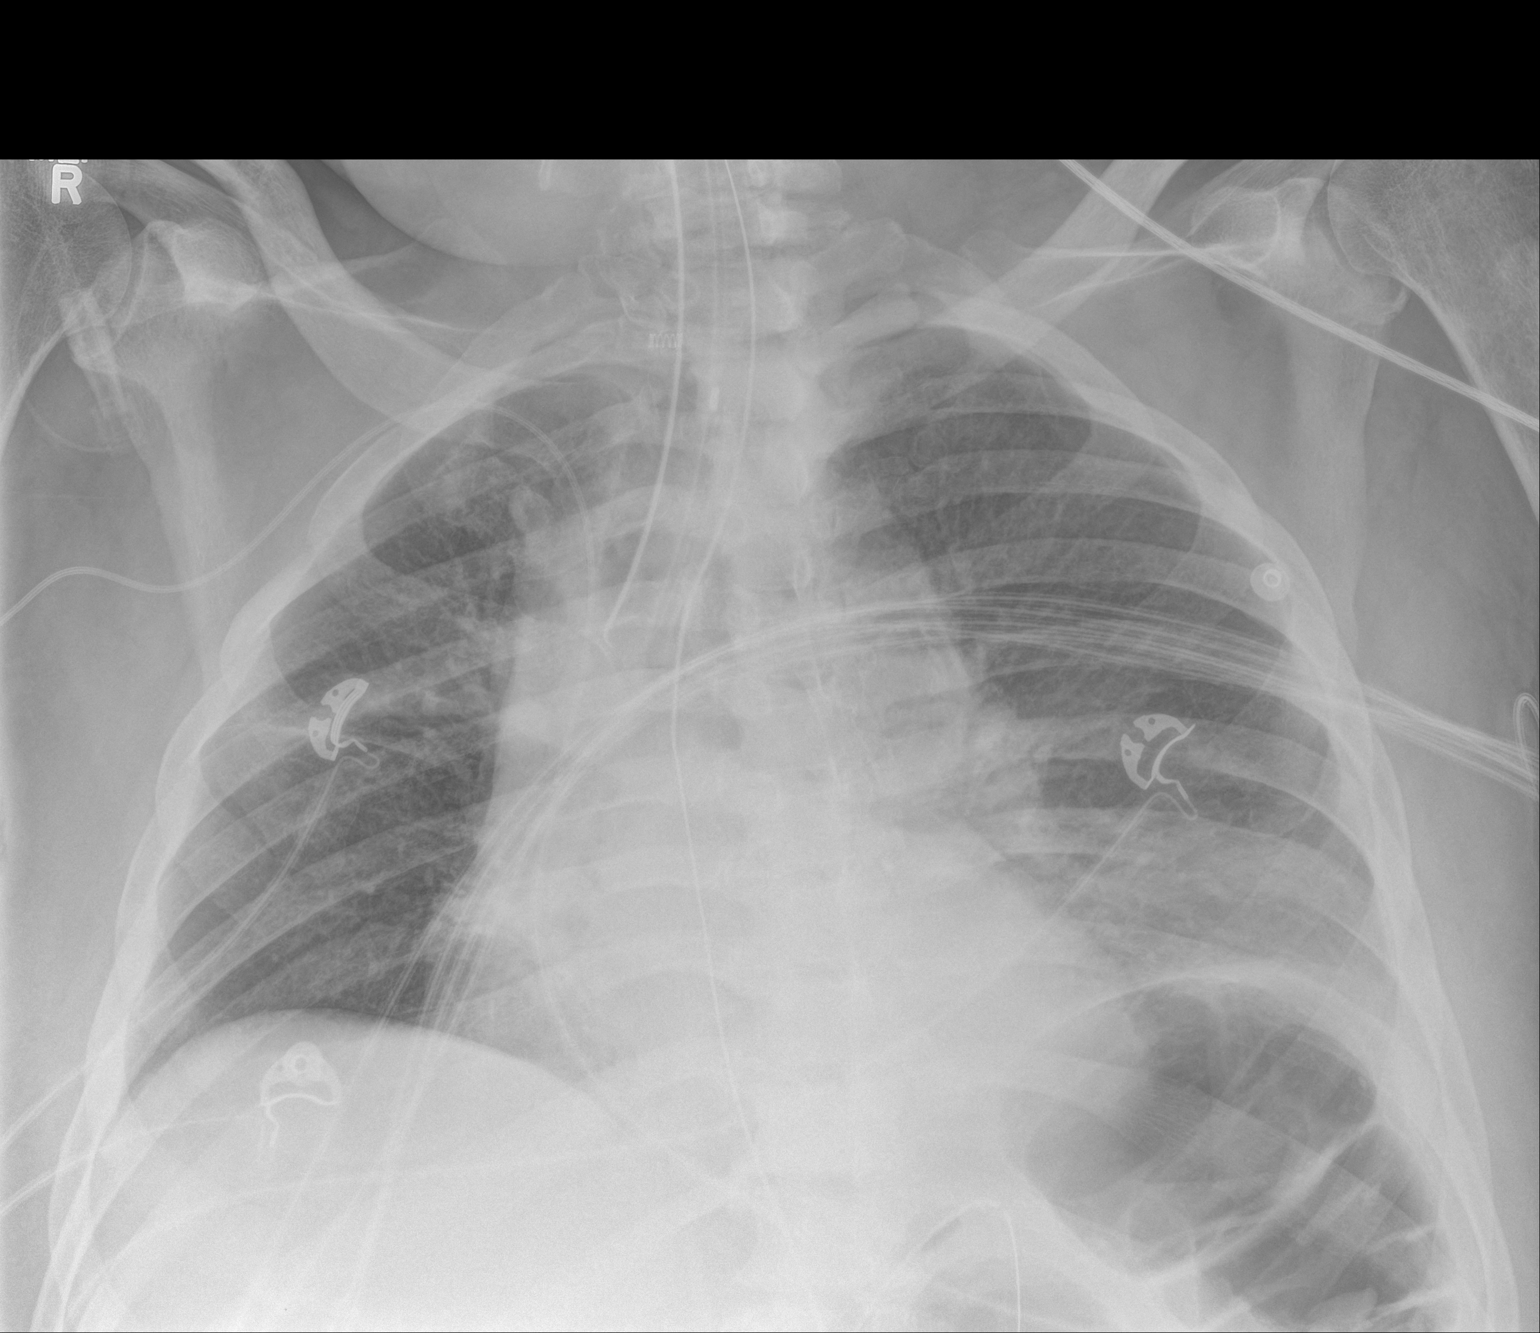

[1 of 1 positions shown; findings below may reference images not displayed]

FINDINGS: Endotracheal tube has been retracted and is approximately 2 cm above
the carina. Right PICC line is unchanged as is the NG tube. There is
mild cardiomegaly. Left lower lobe atelectasis or infiltrate noted.
Patchy right upper lobe opacity also noted. These are unchanged
since prior study.
IMPRESSION: Interval retraction of the endotracheal tube, now approximately 2 cm
above the carina.

Stable patchy right upper lobe and left lower lobe airspace
opacities.

## 2016-12-19 IMAGING — DX DG CHEST 1V PORT
1 series · 1 of 1 positions shown · non-contrast
Comparison: Chest radiograph and CT of the chest performed
06/19/2015

CLINICAL DATA: Acute onset of shortness of breath and decreased O2
saturation. Initial encounter.

EXAM:
PORTABLE CHEST 1 VIEW

[chest ap]
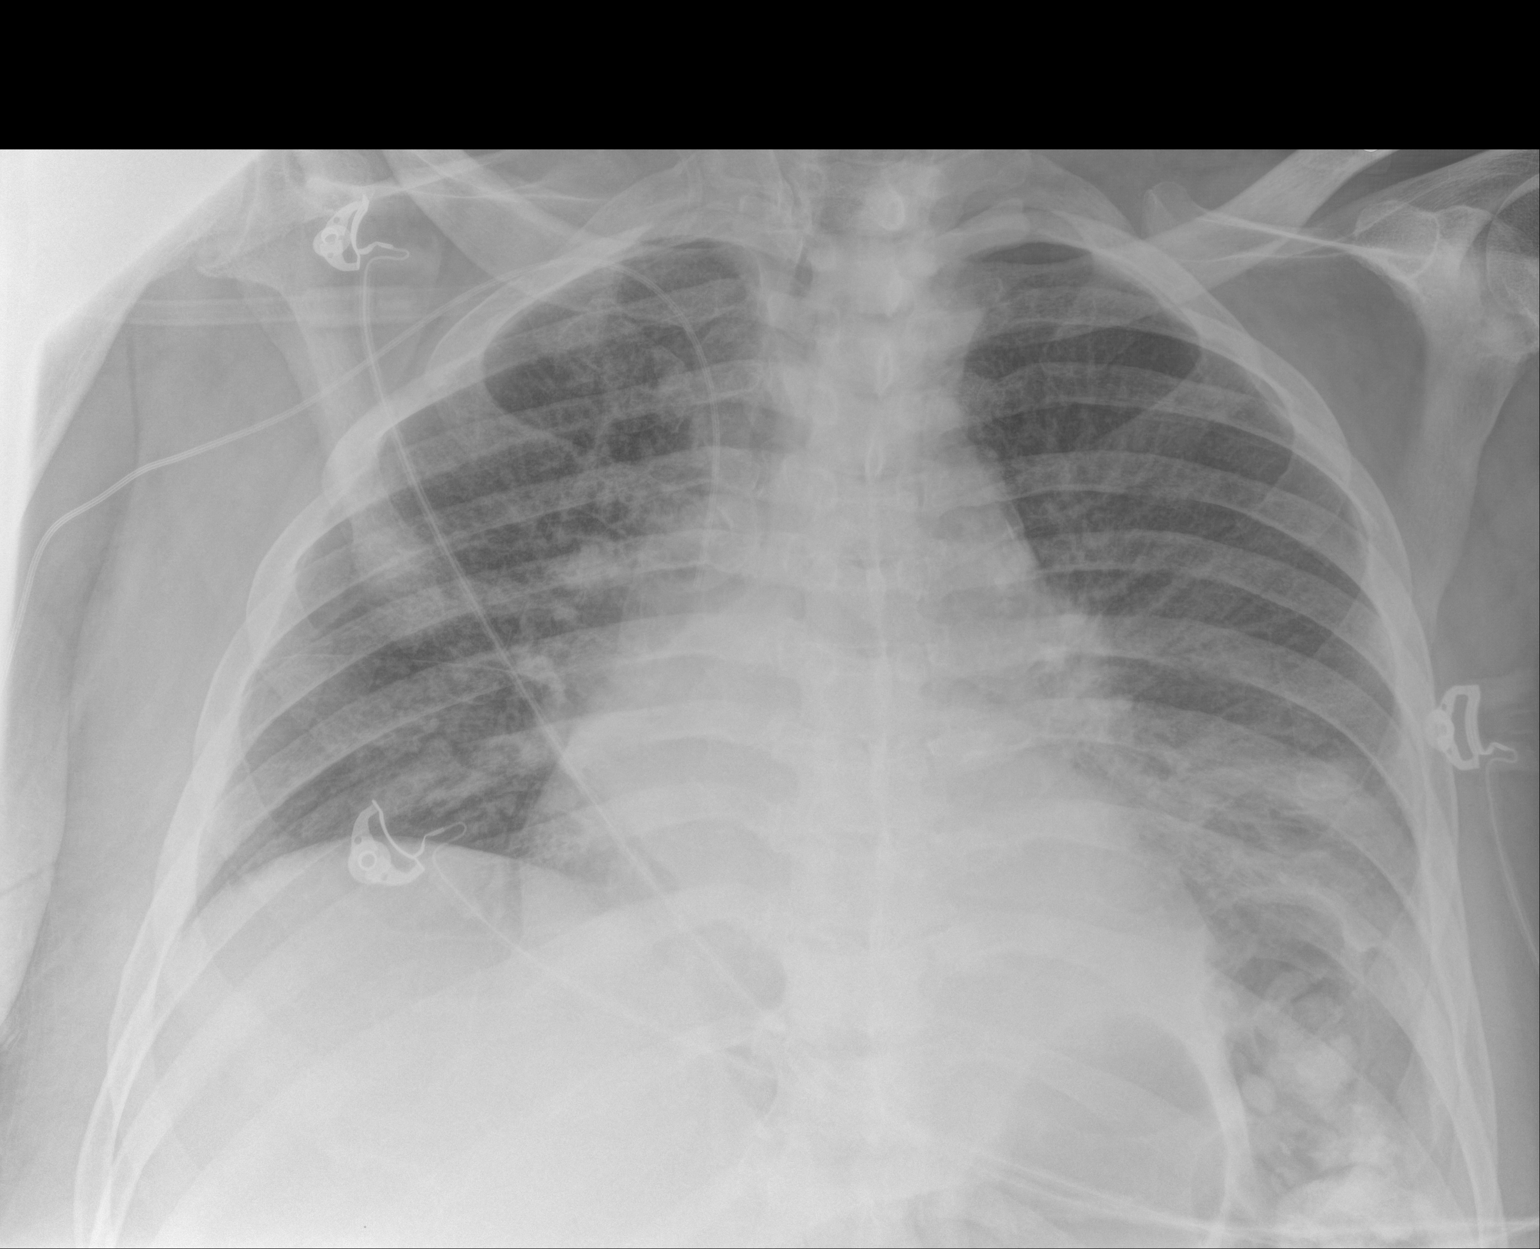

[1 of 1 positions shown; findings below may reference images not displayed]

FINDINGS: The lungs are mildly hypoexpanded. Left basilar airspace opacity
raises concern for pneumonia. There is no evidence of pleural
effusion or pneumothorax.

The cardiomediastinal silhouette is borderline normal in size. No
acute osseous abnormalities are seen.
IMPRESSION: Lungs mildly hypoexpanded. Left basilar airspace opacity is
concerning for pneumonia.

## 2016-12-19 IMAGING — DX DG CHEST 1V PORT
1 series · 1 of 1 positions shown · non-contrast
Comparison: 06/23/2015

CLINICAL DATA: Status post endotracheal intubation

EXAM:
PORTABLE CHEST - 1 VIEW

[chest ap]
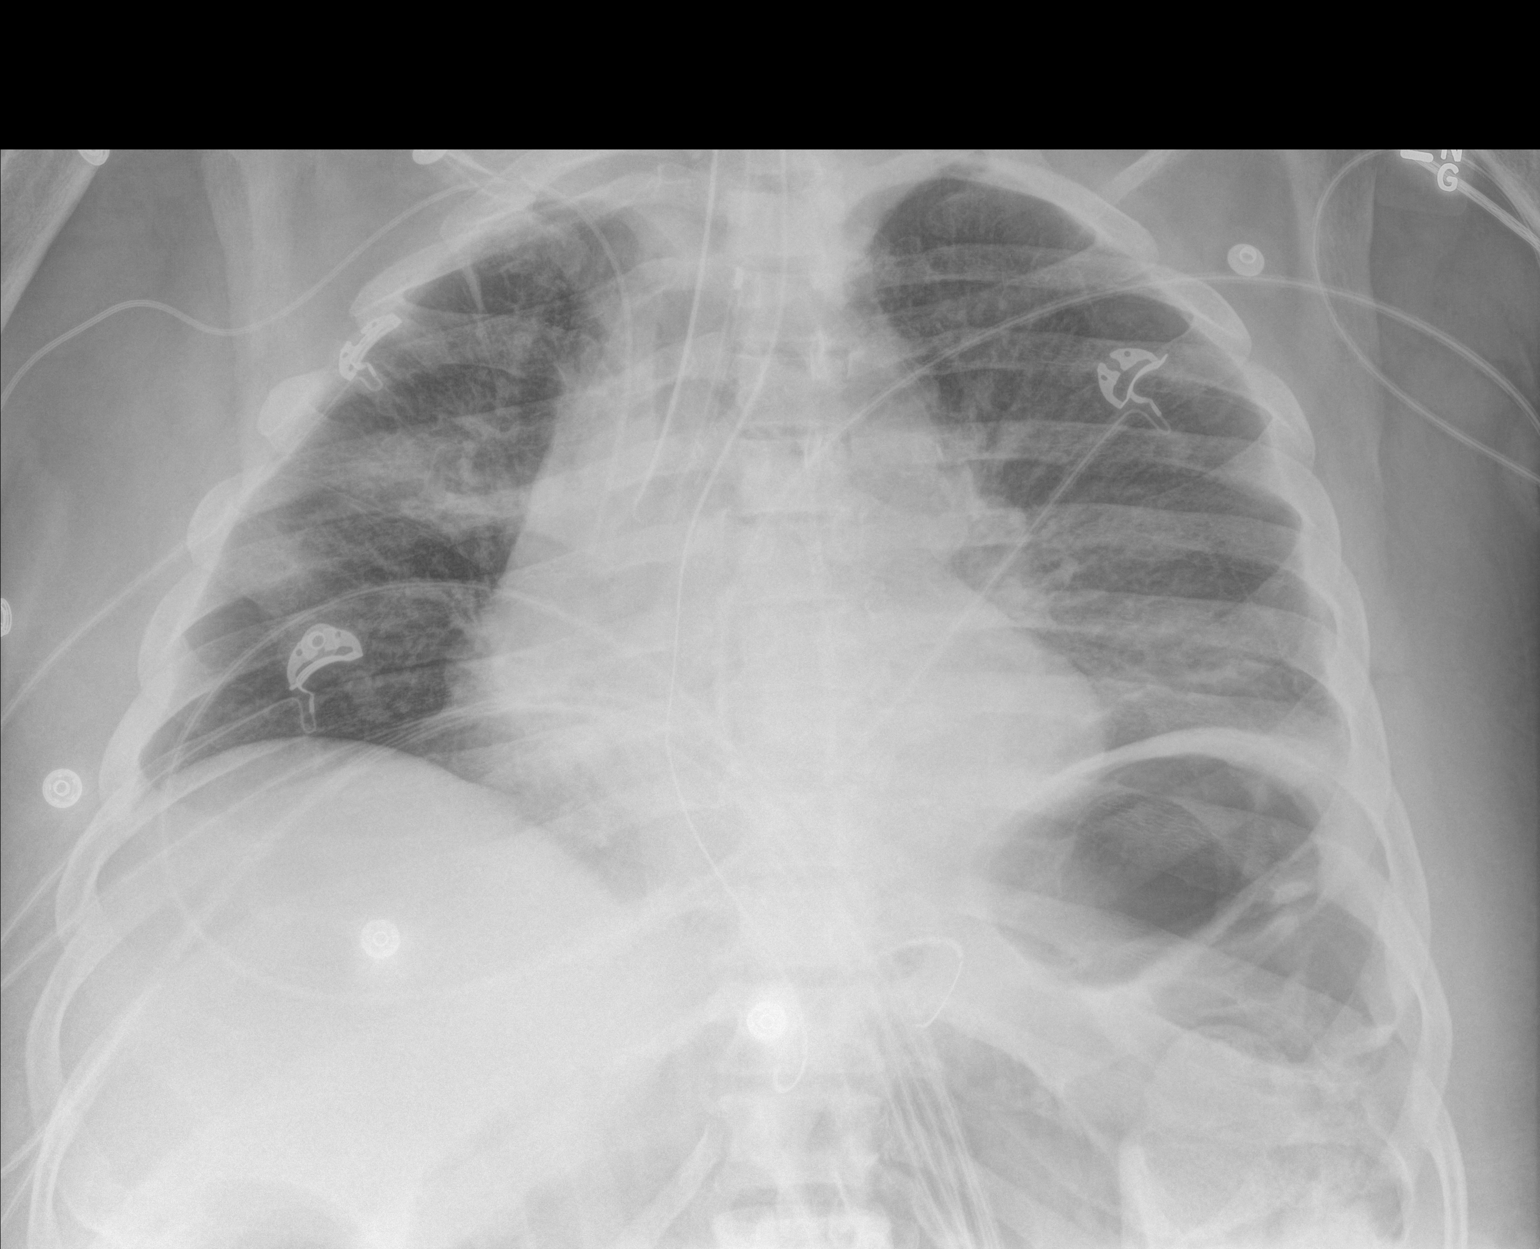

[1 of 1 positions shown; findings below may reference images not displayed]

FINDINGS: Cardiac shadow is stable. Some mild atelectatic changes remain in
the left lung base and right mid lung. Nasogastric catheter is been
placed but is coiled at the level of the diaphragm [REDACTED] lie within
a hiatal hernia. A right-sided PICC line is noted at the cavoatrial
junction. An endotracheal tube is seen at the level of the carina
directing slightly into the right mainstem bronchus. This should be
withdrawn approximately 4 cm. No other focal abnormality is noted.
IMPRESSION: Tubes and lines as described above. The endotracheal tube should be
withdrawn 4 cm.

Bilateral patchy atelectatic changes.

These results were called by telephone at the time of interpretation
on 06/23/2015 at [DATE] to Scr, the patients nurse Who verbally
acknowledged these results.

## 2016-12-20 IMAGING — DX DG CHEST 1V PORT
1 series · 1 of 1 positions shown · non-contrast
Comparison: 06/23/2015.

CLINICAL DATA: Respiratory failure.

EXAM:
PORTABLE CHEST 1 VIEW

[chest ap]
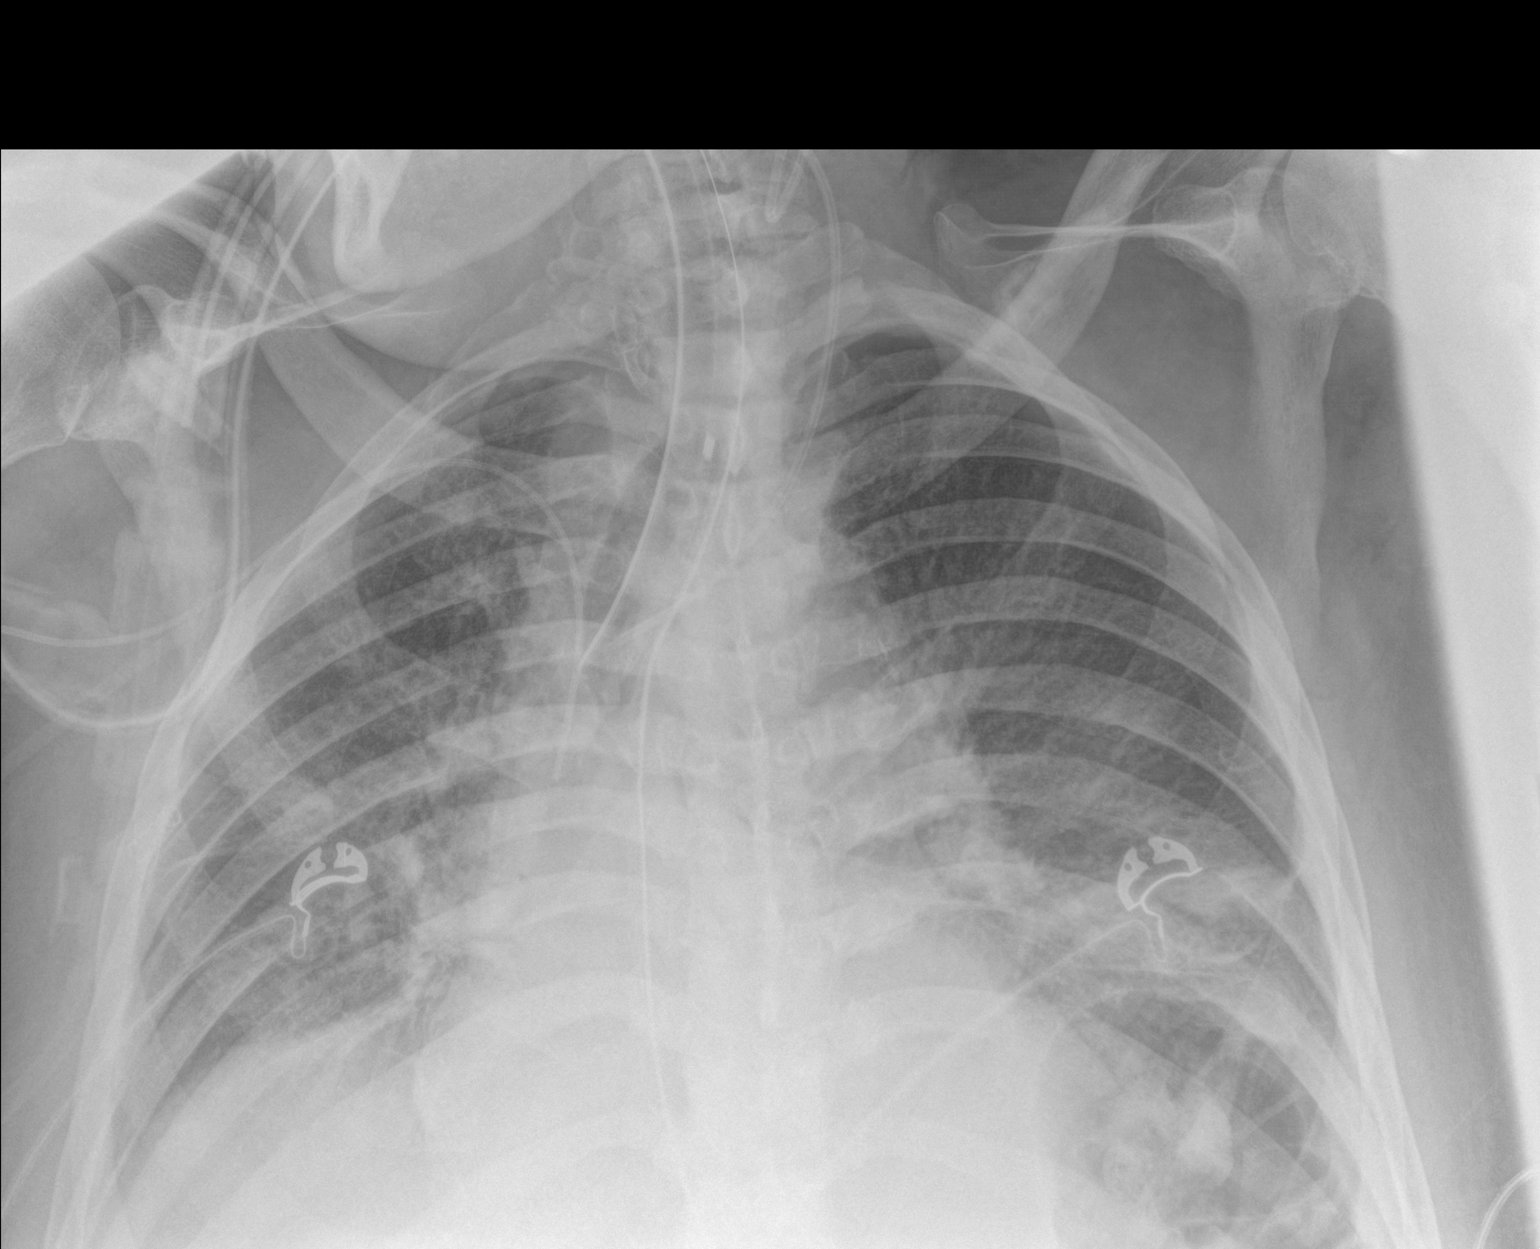

[1 of 1 positions shown; findings below may reference images not displayed]

FINDINGS: Endotracheal tube, NG tube, left IJ line, right PICC line in stable
position. Mediastinum hilar structures are stable. Cardiomegaly with
diffuse bilateral pulmonary interstitial prominence consistent with
congestive heart failure. Pneumonitis cannot be excluded. Persistent
right mid lung and left base subsegmental atelectasis. No prominent
pleural effusion. No pneumothorax.
IMPRESSION: 1. Lines and tubes in stable position.
2. Persistent cardiomegaly. Interim appearance of bilateral from
interstitial prominence. Findings suggest congestive heart failure.
3. Persistent low lung volumes with persistent right mid lung and
left base subsegmental atelectasis.

## 2016-12-21 IMAGING — DX DG CHEST 1V PORT
1 series · 2 of 2 positions shown · non-contrast
Comparison: 06/24/2015.

CLINICAL DATA: Respiratory failure.

EXAM:
PORTABLE CHEST 1 VIEW

[Series 1: chest ap · 0.14mm/px · 2 of 2 slices shown]
[im 1/2]
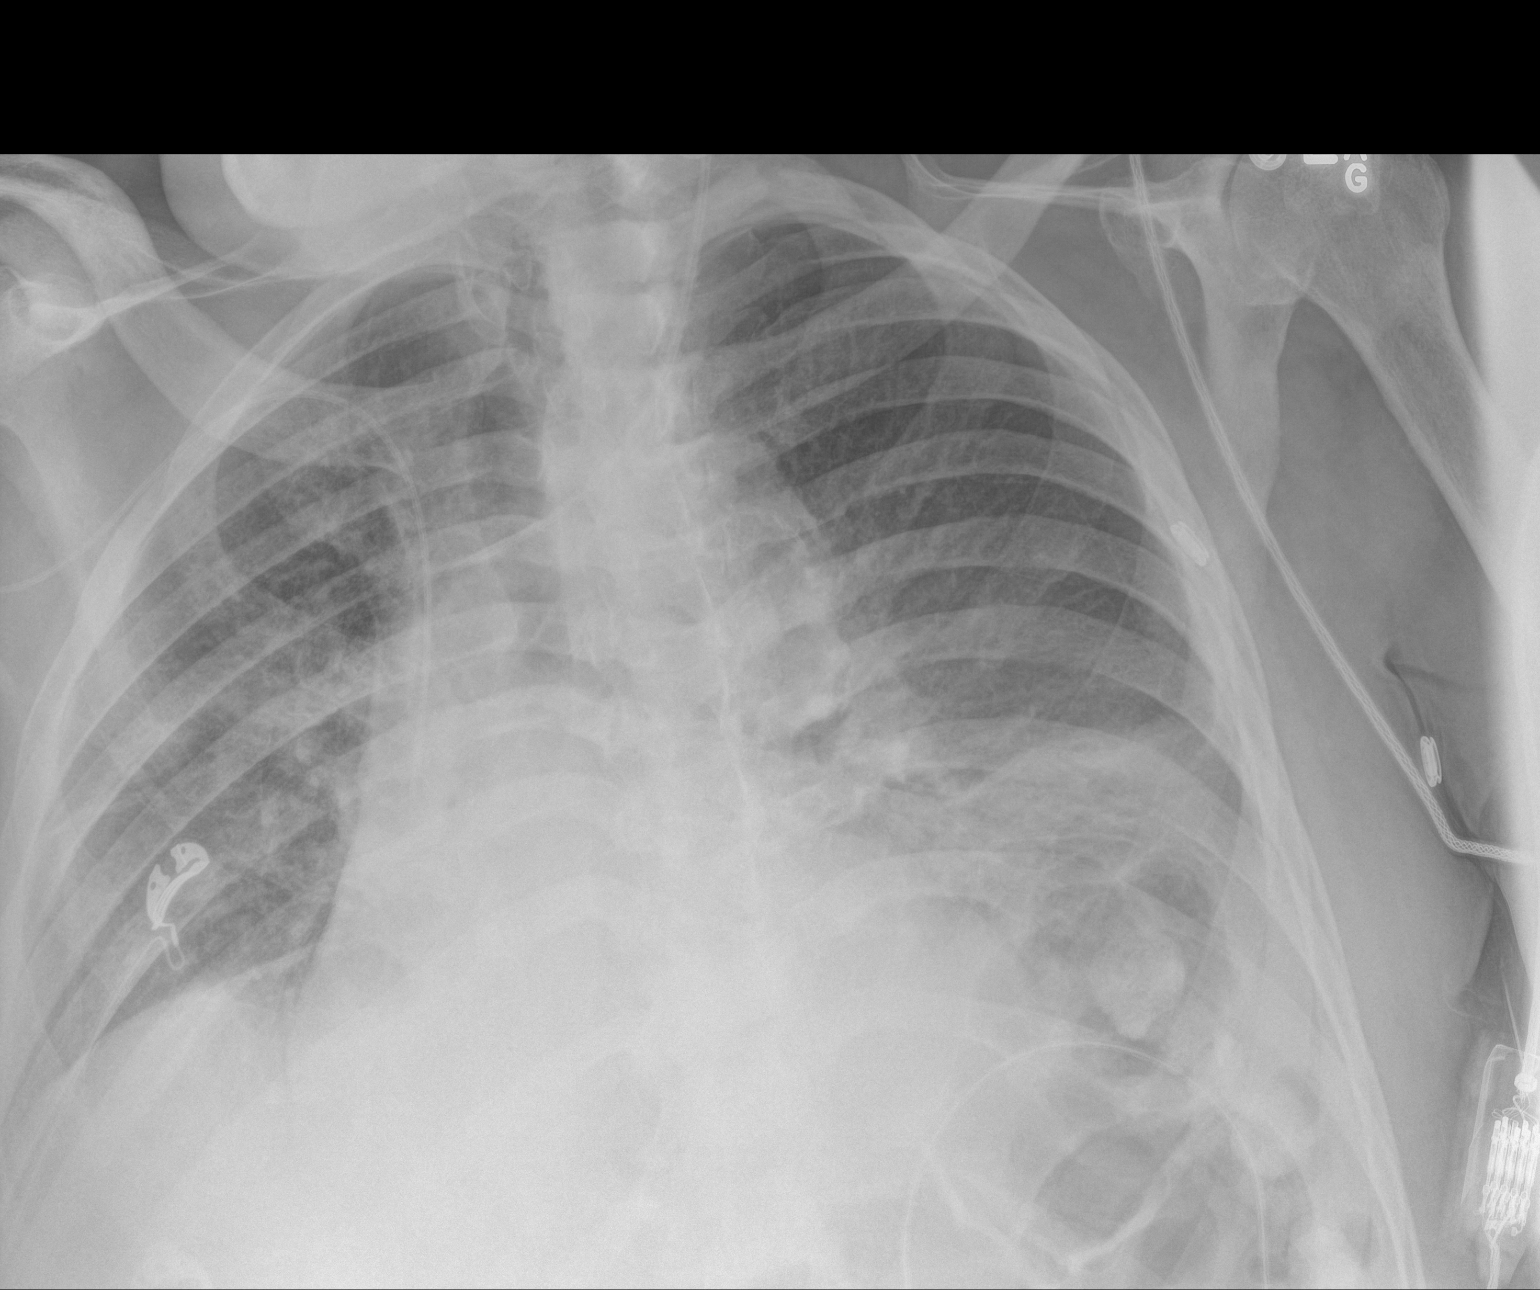
[im 2/2]
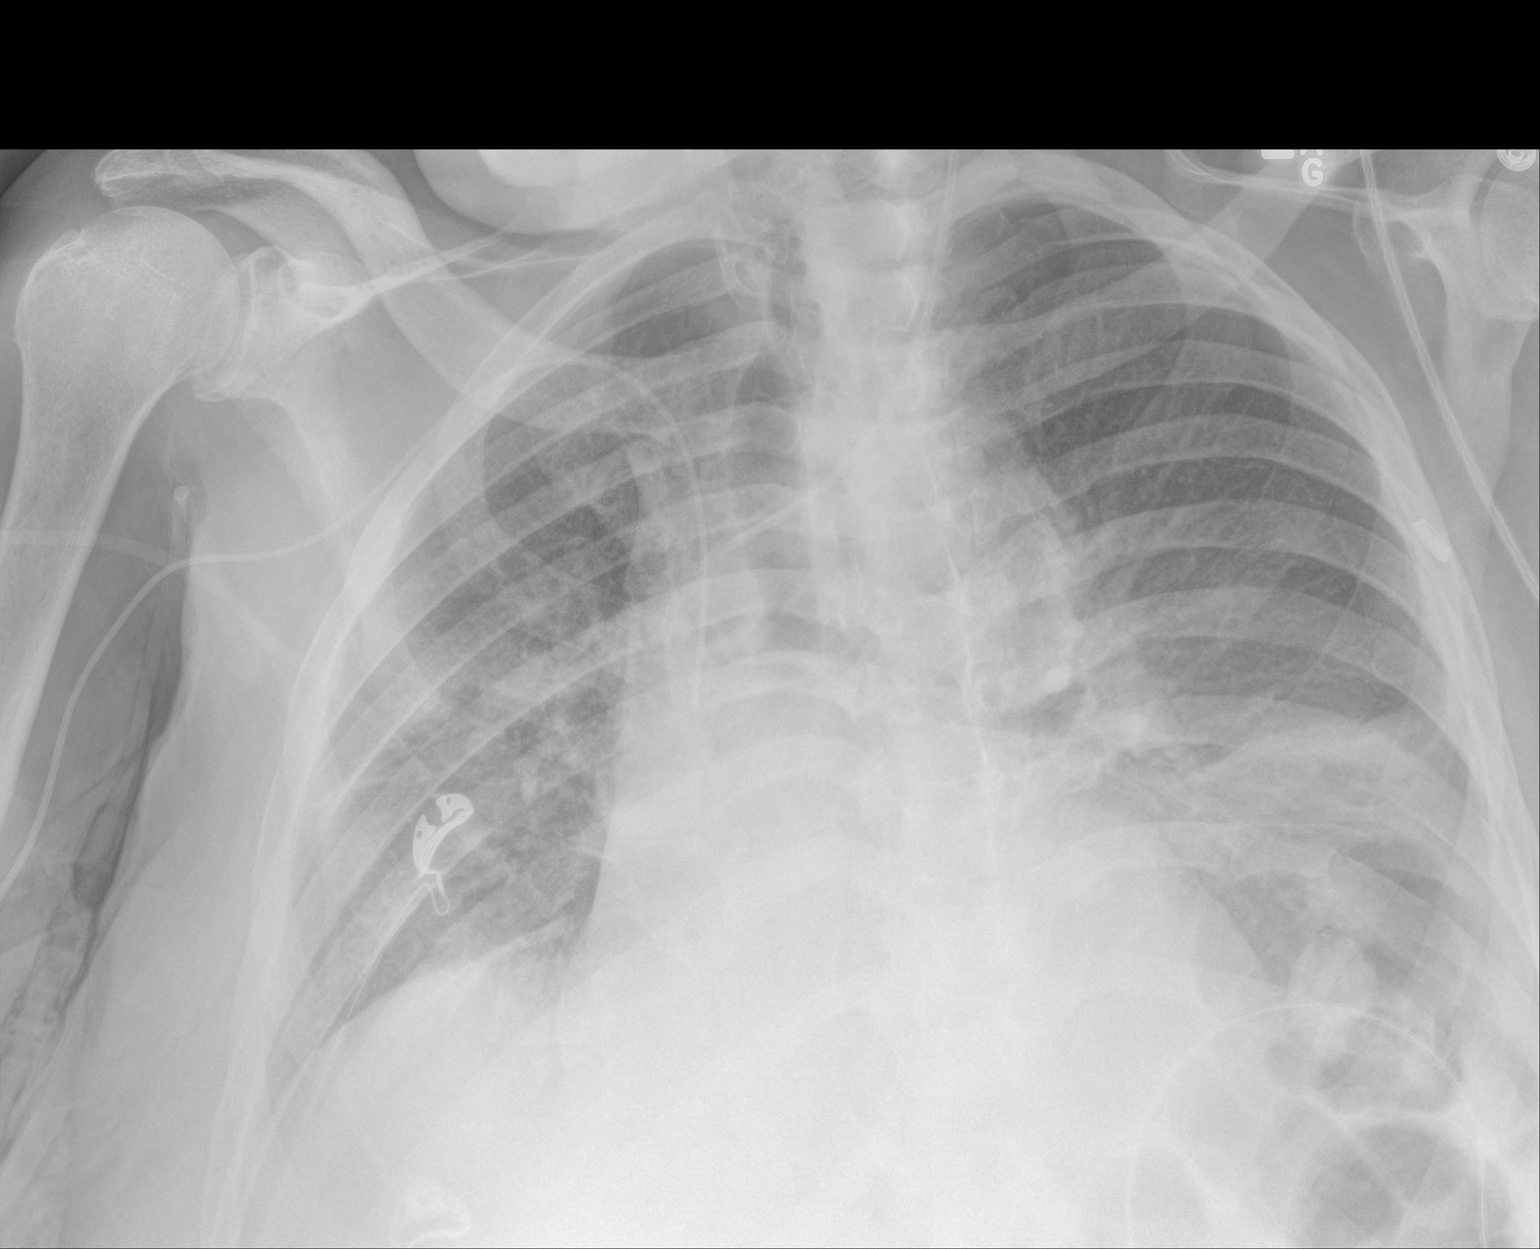

[2 of 2 positions shown; findings below may reference images not displayed]

FINDINGS: Interim extubation and removal of NG tube . Right PICC line, left IJ
line stable position. Cardiomegaly. Persistent interstitial
prominence, interstitial edema cannot be excluded. Persistent low
lung volumes with right mid lung field and left base atelectasis
and/or infiltrates. No pleural effusion or pneumothorax.
IMPRESSION: 1. Interim extubation removal of NG tube. Left IJ line and right
PICC line stable position.
2. Persistent cardiomegaly pulmonary interstitial prominence
suggesting interstitial edema. No interim change.
3. Persistent low lung volumes with right mid lung and left base
subsegmental atelectasis.

## 2016-12-23 IMAGING — DX DG CHEST 1V PORT
1 series · 1 of 1 positions shown · non-contrast
Comparison: Portable exam 0665 hours compared to 06/25/2015

CLINICAL DATA: Shortness of breath, abdominal distension,
nasogastric tube placement, hypertension, type II diabetes mellitus,
prostate cancer, former smoker

EXAM:
PORTABLE CHEST 1 VIEW

[chest ap]
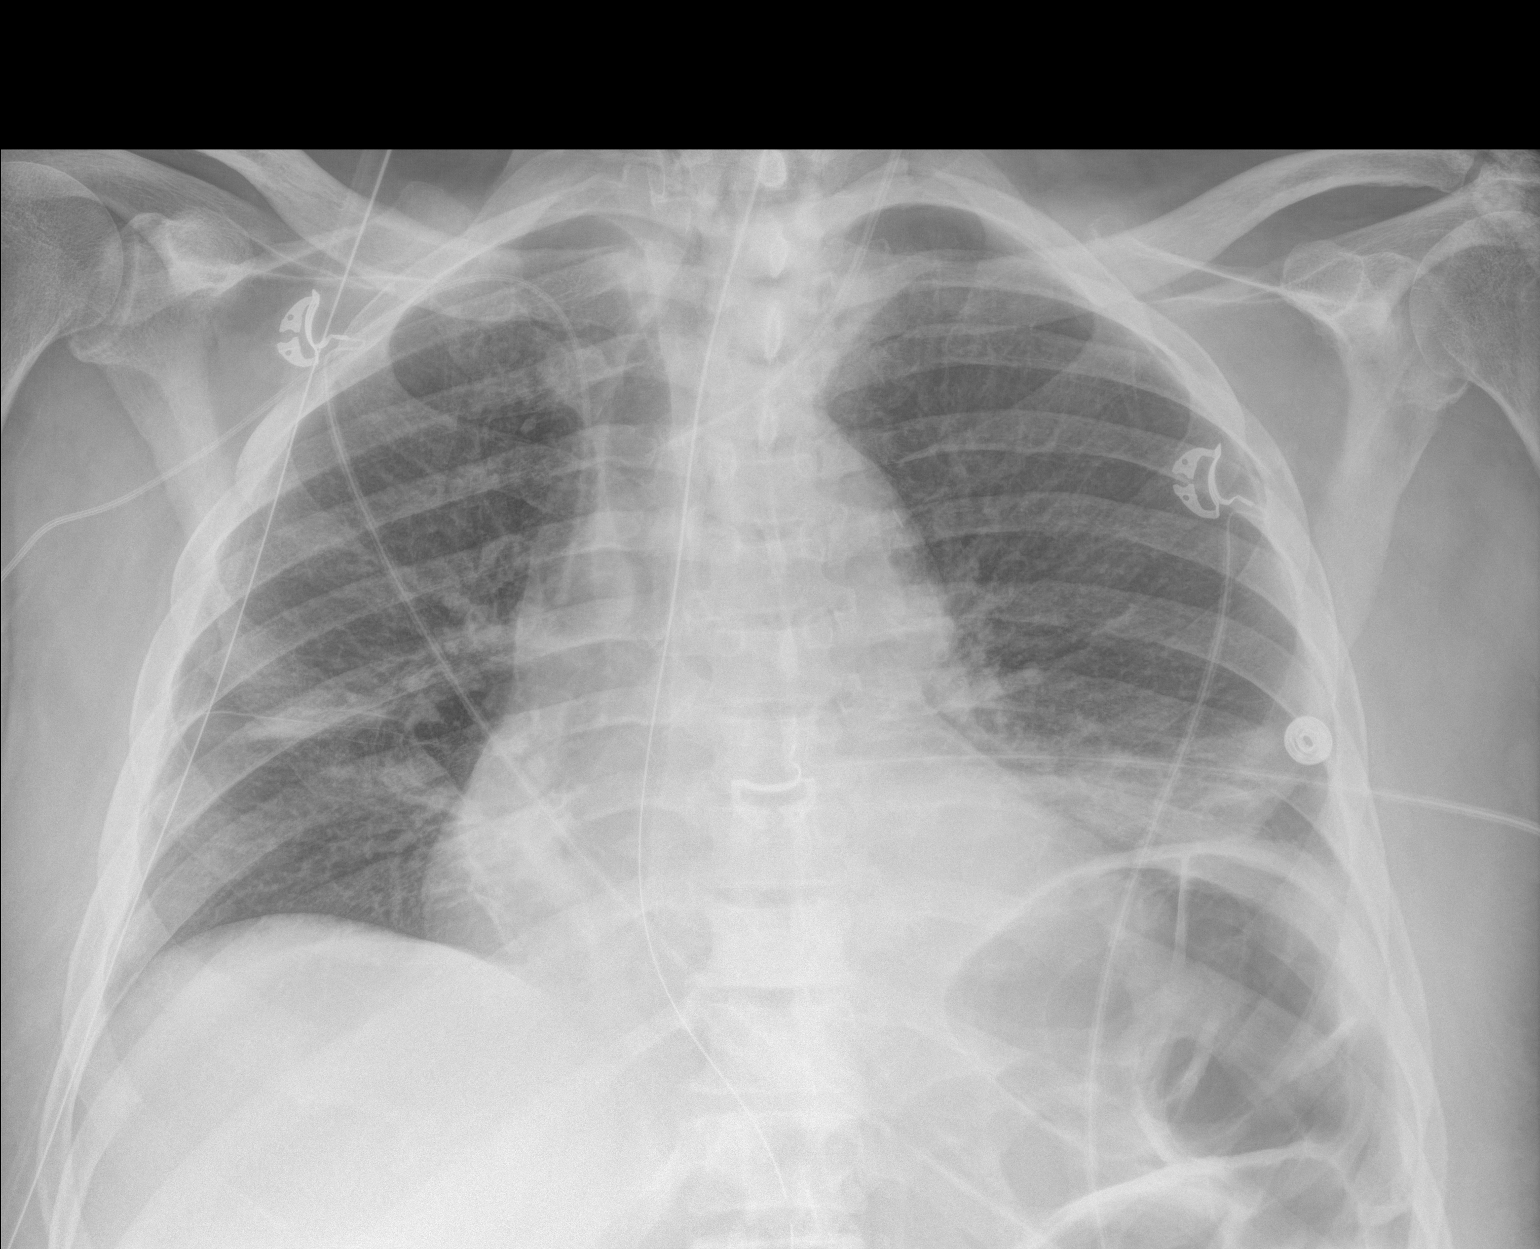

[1 of 1 positions shown; findings below may reference images not displayed]

FINDINGS: Nasogastric tube extends into stomach.

RIGHT arm PICC line tip projects over SVC.

LEFT jugular central venous catheter tip projects over confluence of
LEFT brachiocephalic vein and SVC.

Rotated to the RIGHT.

Enlargement of cardiac silhouette.

Atherosclerotic calcification aorta.

Bibasilar atelectasis.

Improved perihilar infiltrates.

No pleural effusion or pneumothorax.

Gaseous distention of bowel in LEFT upper quadrant.
IMPRESSION: Bibasilar atelectasis.

Enlargement of cardiac silhouette.

Improved pulmonary infiltrates.

## 2016-12-23 IMAGING — DX DG ABD PORTABLE 1V
2 series · 2 of 2 positions shown · non-contrast
Comparison: 06/19/2015 and prior CTs. 06/12/2015 and prior
radiographs.

CLINICAL DATA: Abdominal distention today.  NG tube placement.

EXAM:
PORTABLE ABDOMEN - 1 VIEW

[abdomen kub (1 of 2)]
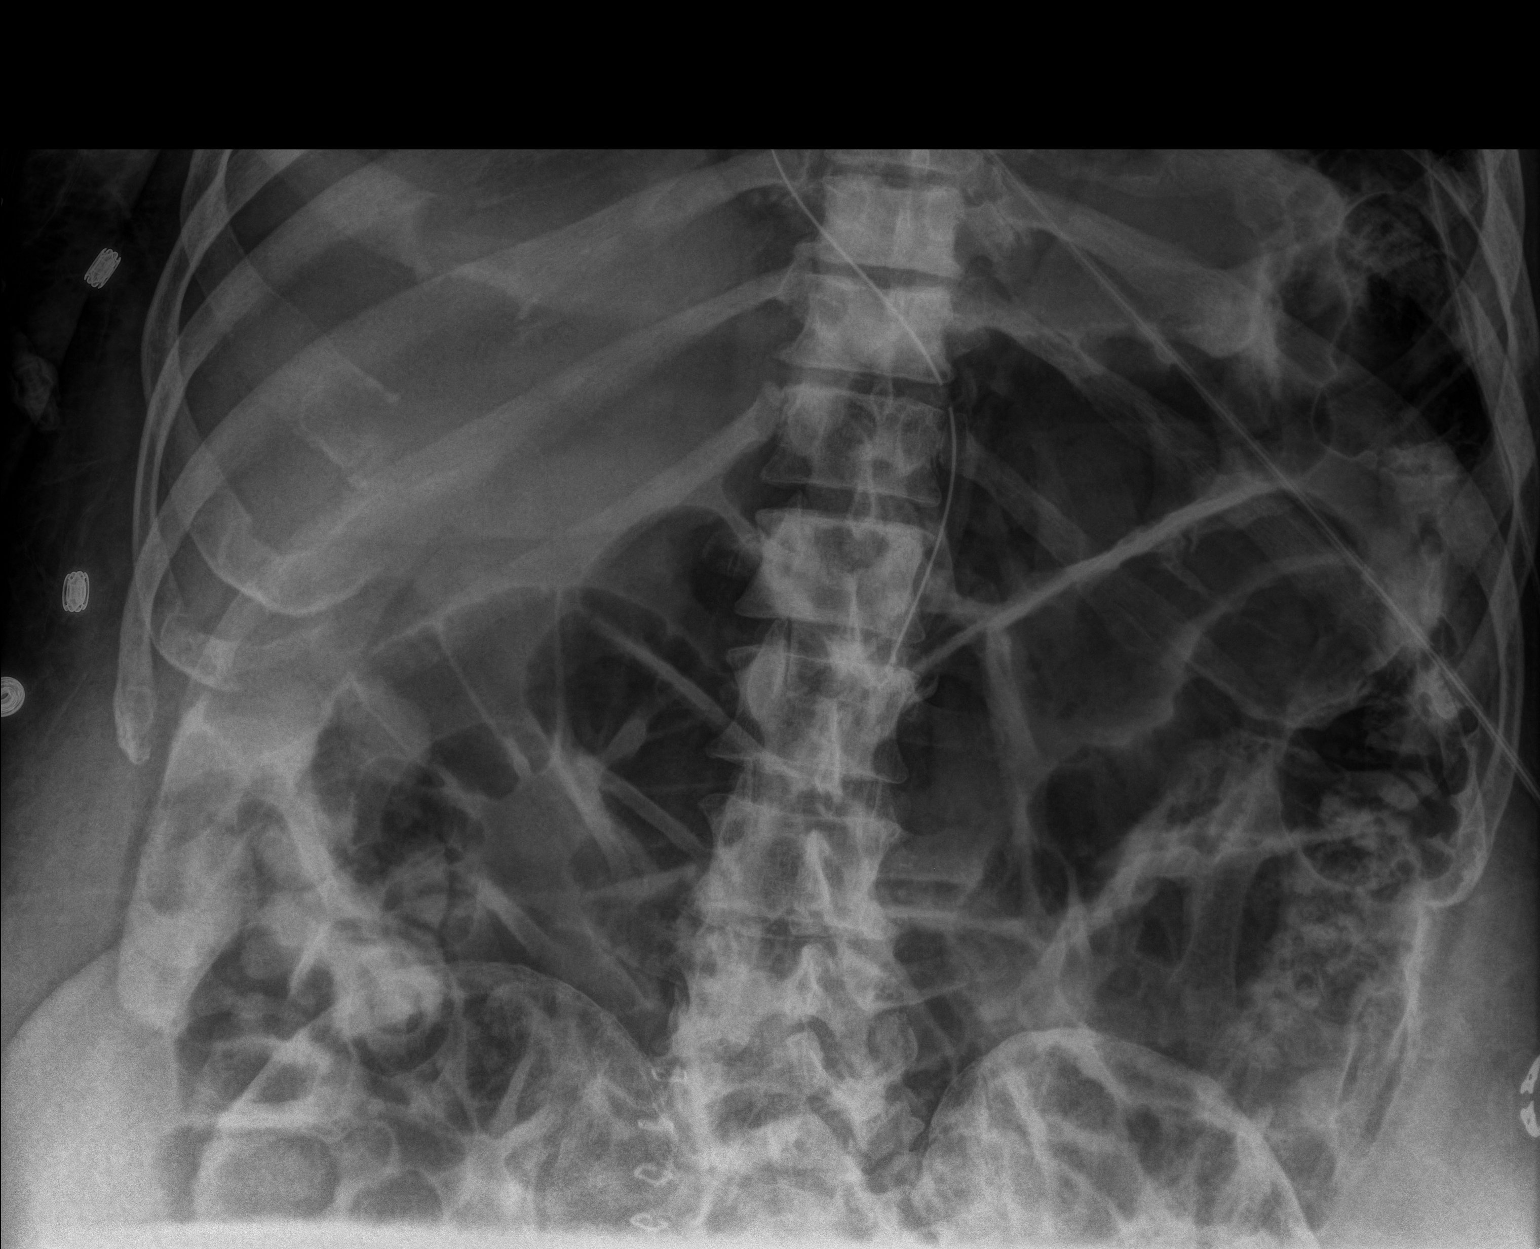

[abdomen kub (2 of 2)]
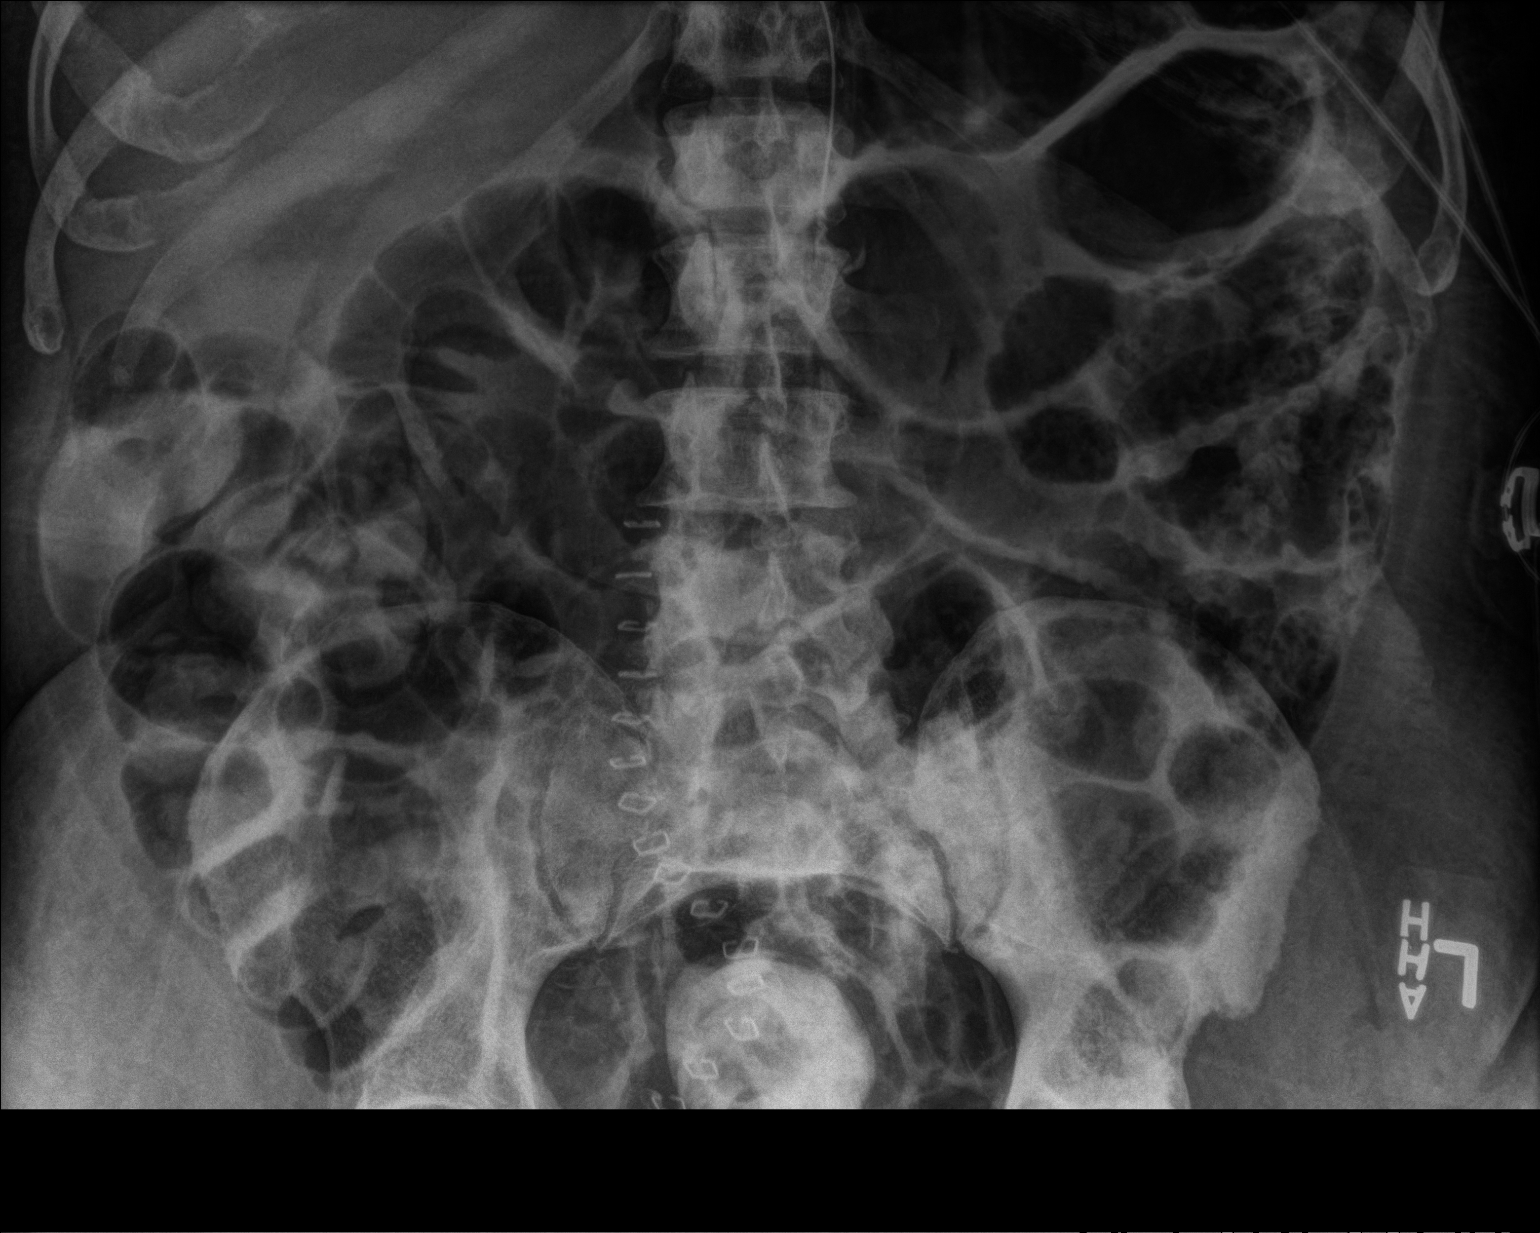

[2 of 2 positions shown; findings below may reference images not displayed]

FINDINGS: An NG tube is identified with tip overlying the mid stomach.

Mild gaseous distention of colon and small bowel again noted.

Surgical staples overlying the abdomen and pelvis are again noted.

No other significant changes noted.
IMPRESSION: NG tube with tip overlying the mid stomach.

Continued mild gaseous distension of small bowel and colon-question
ileus.

## 2016-12-25 IMAGING — DX DG CHEST 1V PORT
1 series · 1 of 1 positions shown · non-contrast
Comparison: 06/27/2015.

CLINICAL DATA: Ileus.  Hypertension.

EXAM:
PORTABLE CHEST 1 VIEW

[chest ap]
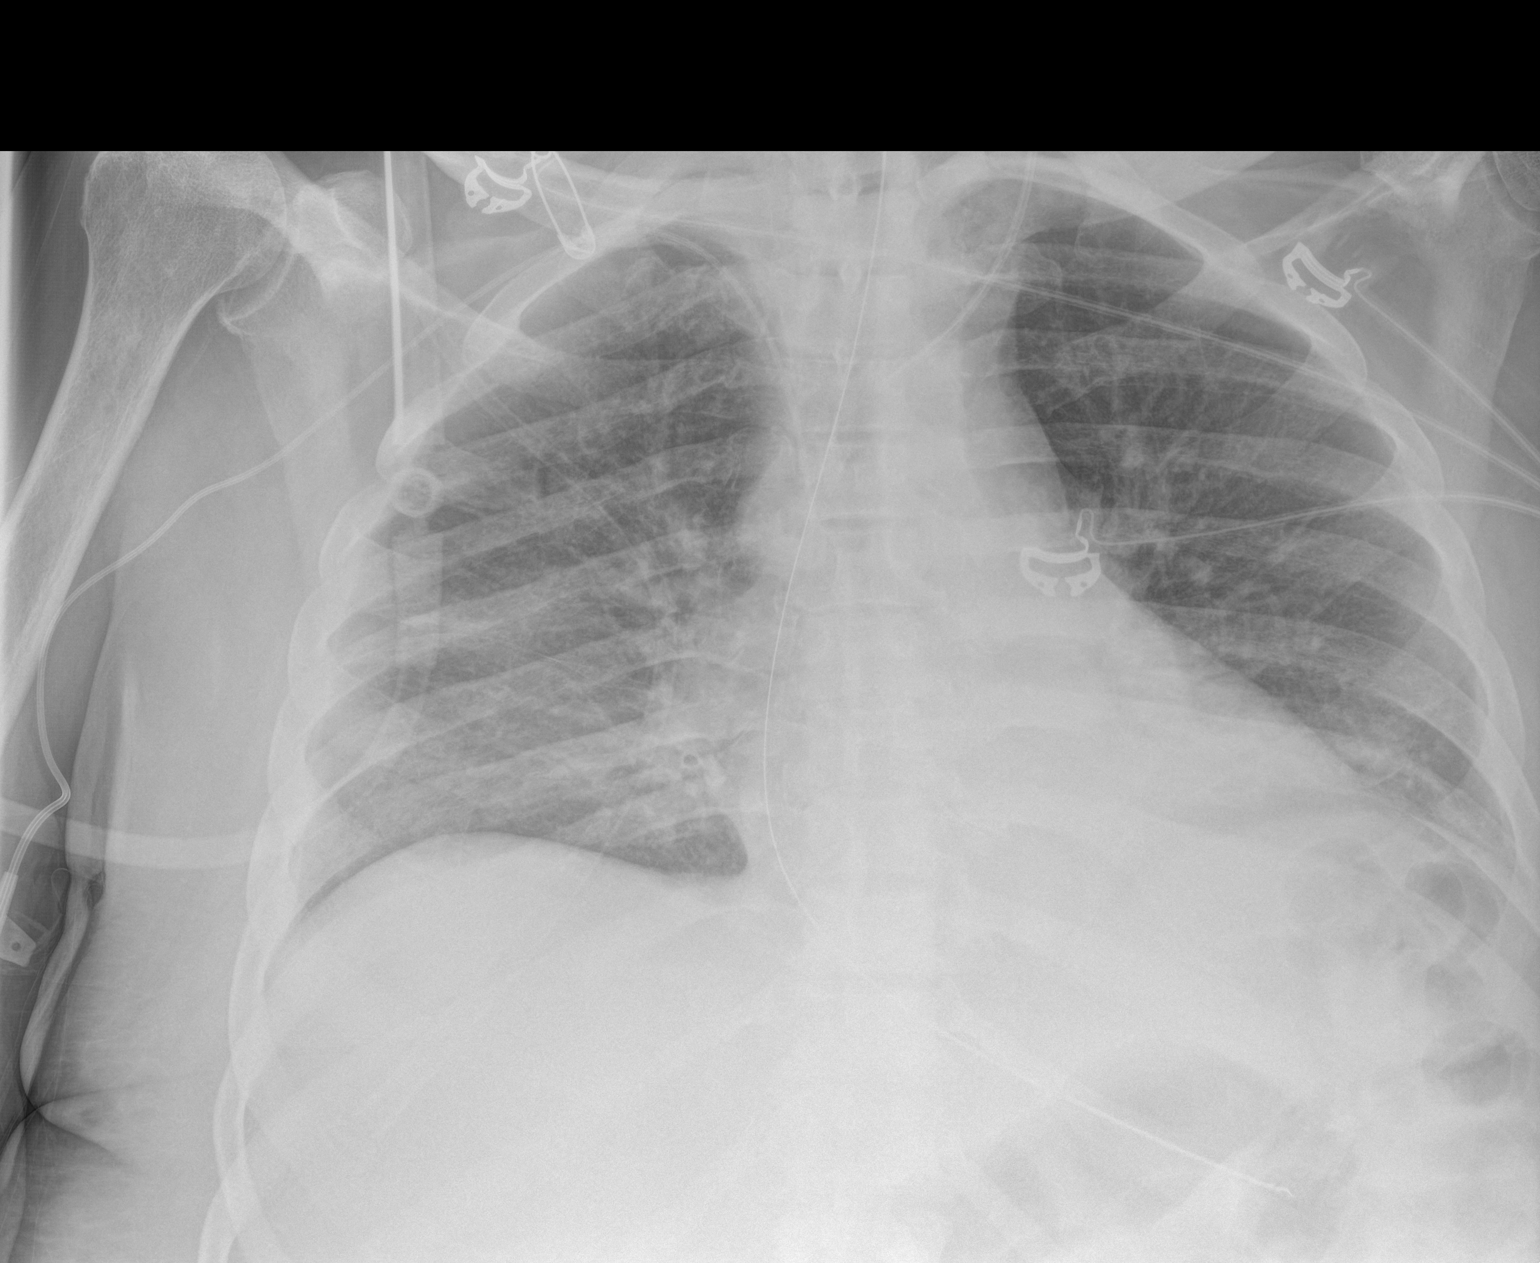

[1 of 1 positions shown; findings below may reference images not displayed]

FINDINGS: Left IJ line, right PICC line, NG tube in stable position.
Mediastinum hilar structures are normal. Cardiomegaly with bilateral
pulmonary interstitial prominence noted. Findings consistent with
congestive heart failure. Pneumonitis cannot be excluded. Persistent
low lung volumes with bibasilar atelectasis. No pleural effusion or
pneumothorax.
IMPRESSION: 1. Lines and tubes in stable position.
2. Cardiomegaly with bilateral mild pulmonary interstitial
prominence suggesting mild congestive heart failure.
3. Persistent low lung volumes with bibasilar atelectasis.

## 2016-12-26 IMAGING — DX DG CHEST 1V PORT
1 series · 1 of 1 positions shown · non-contrast
Comparison: 06/29/2015 .

CLINICAL DATA: Pneumonia.

EXAM:
PORTABLE CHEST 1 VIEW

[chest ap]
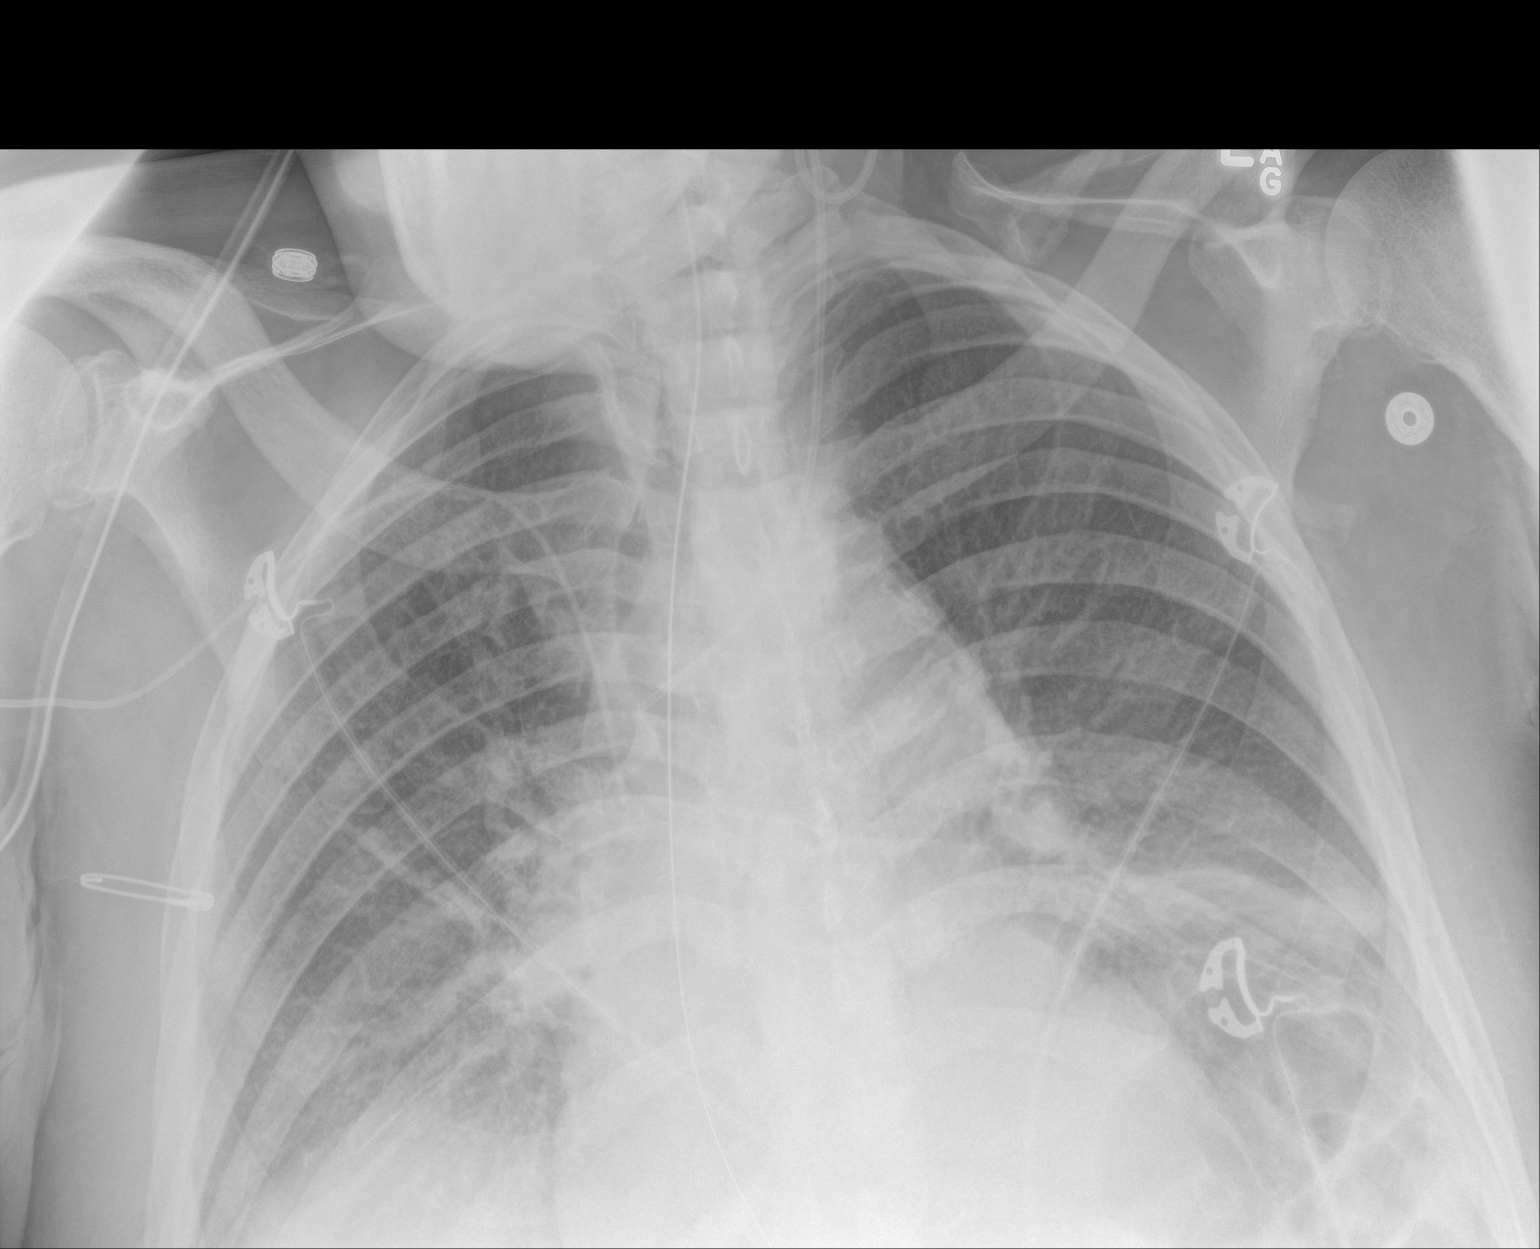

[1 of 1 positions shown; findings below may reference images not displayed]

FINDINGS: NG tube, left IJ line, right PICC line in stable position .
Cardiomegaly with bilateral pulmonary interstitial prominence again
noted. Findings suggest congestive heart failure. Low lung volumes
with basilar atelectasis. No prominent pleural effusion or
pneumothorax
IMPRESSION: 1. Lines and tubes in stable position.
2. Cardiomegaly with pulmonary interstitial prominence consistent
with congestive heart failure. Similar findings noted on prior exam.
3. Lung volumes with bibasilar atelectasis .

## 2016-12-28 IMAGING — DX DG CHEST 1V
1 series · 1 of 1 positions shown · non-contrast
Comparison: 06/30/2015

CLINICAL DATA: Three weeks post bowel resection an exploratory
laparotomy. Fever.

EXAM:
CHEST 1 VIEW

[chest ap]
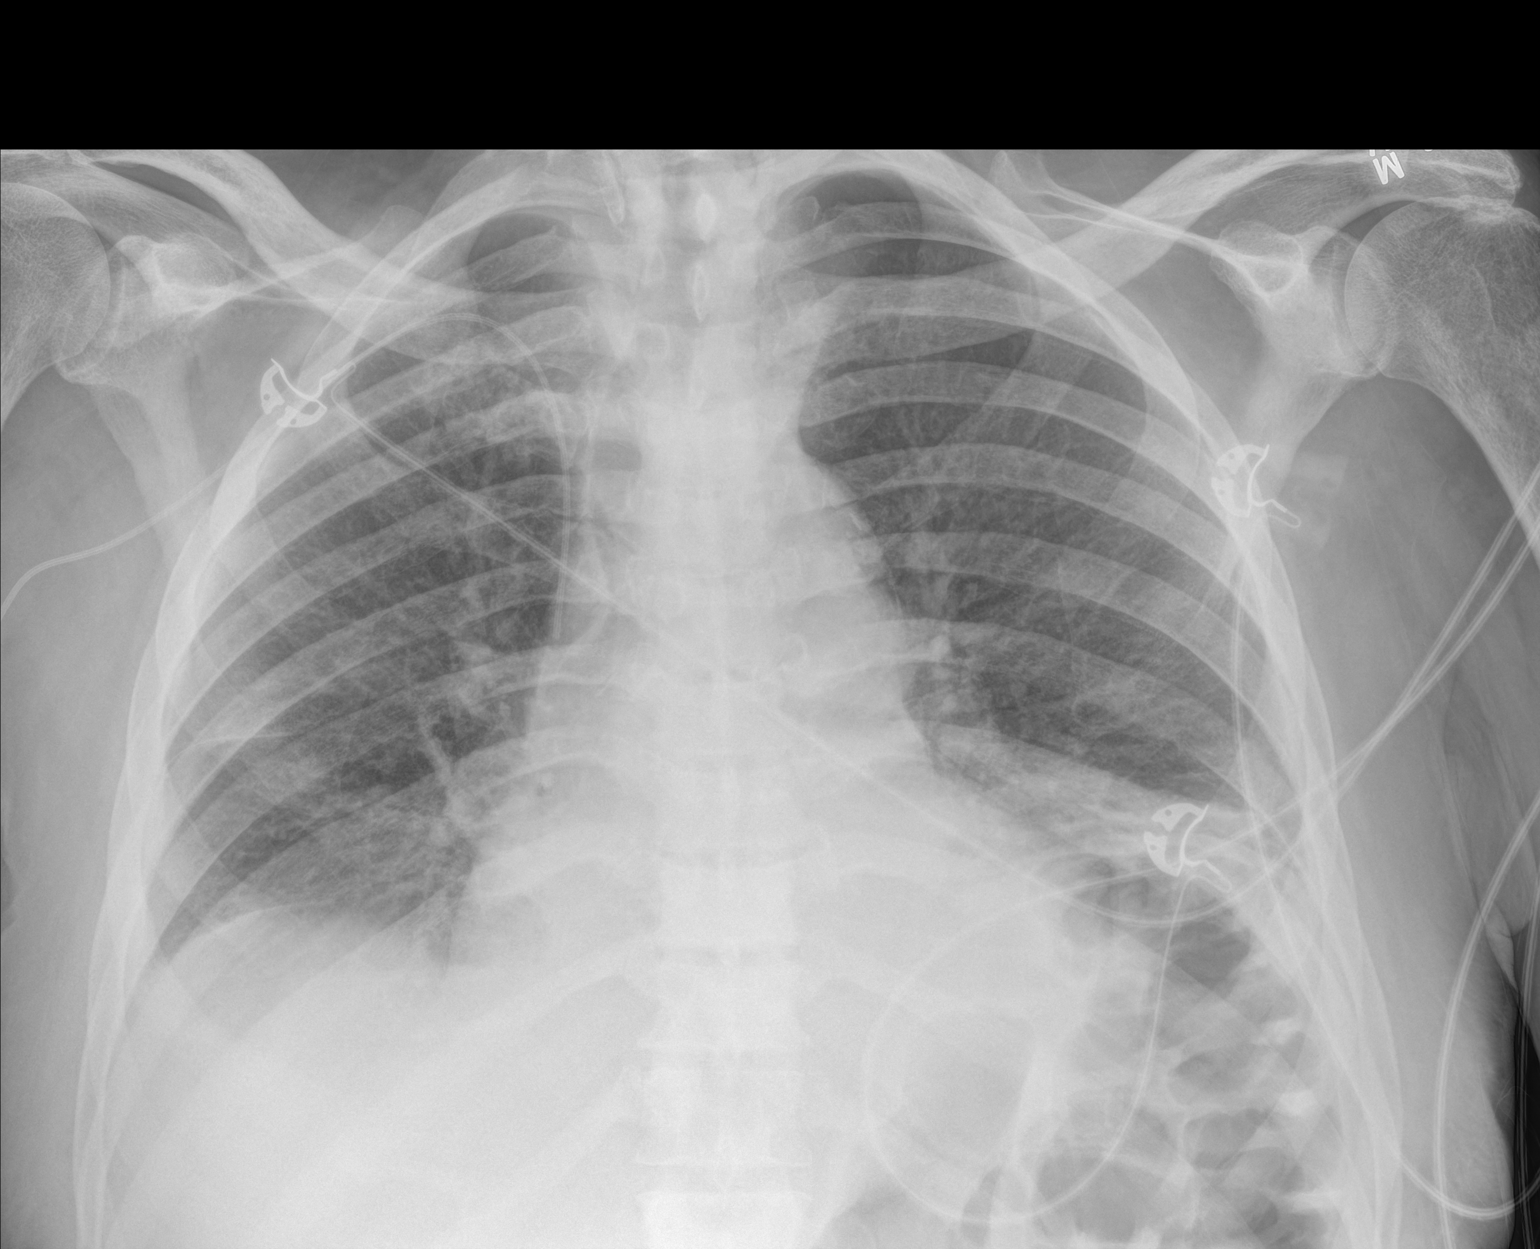

[1 of 1 positions shown; findings below may reference images not displayed]

FINDINGS: Interval removal of enteric tube and left IJ central venous
catheter. Right-sided PICC line unchanged. Lungs are somewhat
hypoinflated with bibasilar opacification likely atelectasis
although cannot exclude infection. Cardiomediastinal silhouette and
remainder of the exam is unchanged.
IMPRESSION: Hypoinflation with bibasilar opacification likely atelectasis
although cannot exclude infection.

## 2016-12-31 IMAGING — CT CT ABD-PELV W/ CM
2 of 6 series · 14 of 46 positions shown, 16 images · IV contrast (OMNIPAQUE 300)
Comparison: CTA of the chest and CT of the abdomen and pelvis
performed 06/19/2015

CLINICAL DATA: Acute onset of generalized abdominal pain. Fever and
diarrhea. Initial encounter.

EXAM:
CT CHEST, ABDOMEN, AND PELVIS WITH CONTRAST
TECHNIQUE: Multidetector CT imaging of the chest, abdomen and pelvis was
performed following the standard protocol during bolus
administration of intravenous contrast.
CONTRAST:  100mL OMNIPAQUE IOHEXOL 300 MG/ML  SOLN

[Series 2: abd/pel with · axial · 0.86mm/px · z∈[-661,-96]mm · 11 of 131 slices shown, 13 images]
[im 9/131  soft-tissue]
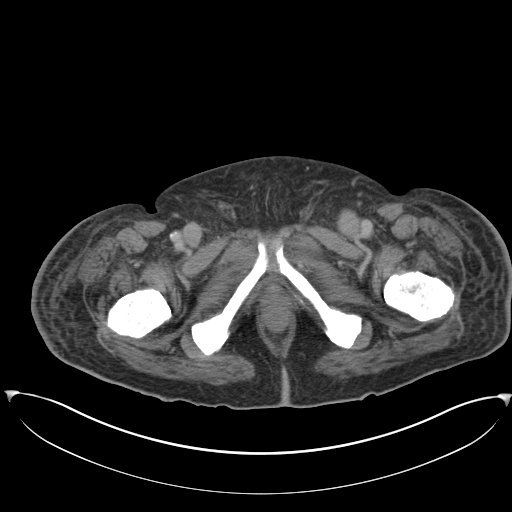
[im 9/131  bone]
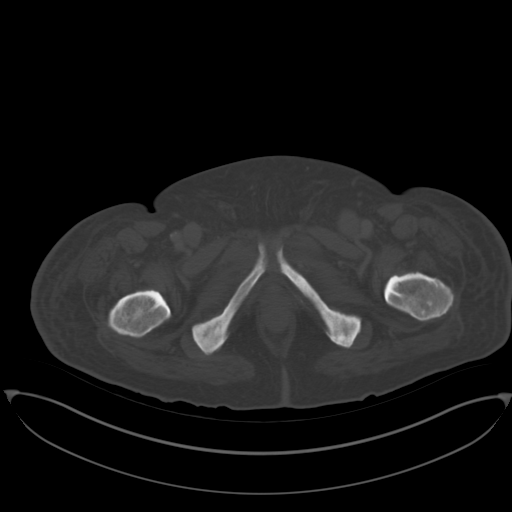
[im 18/131  soft-tissue]
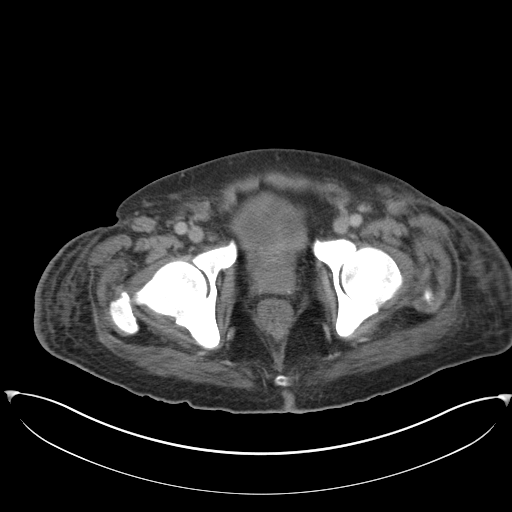
[im 35/131  soft-tissue]
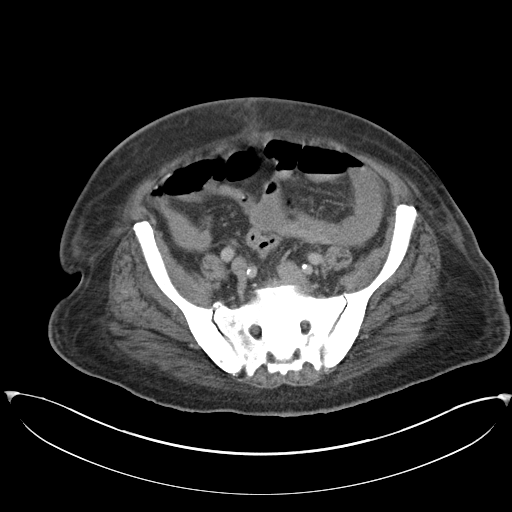
[im 44/131  soft-tissue]
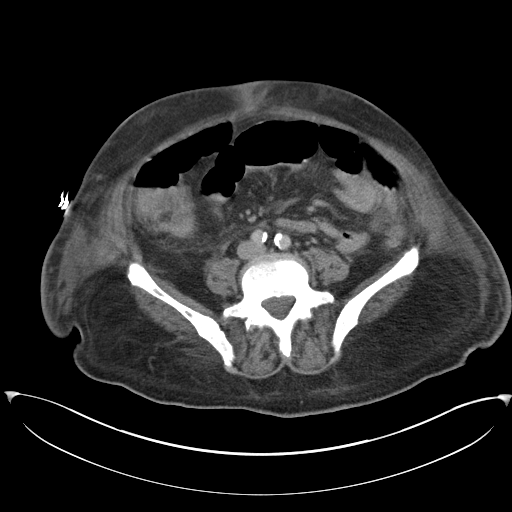
[im 53/131  soft-tissue]
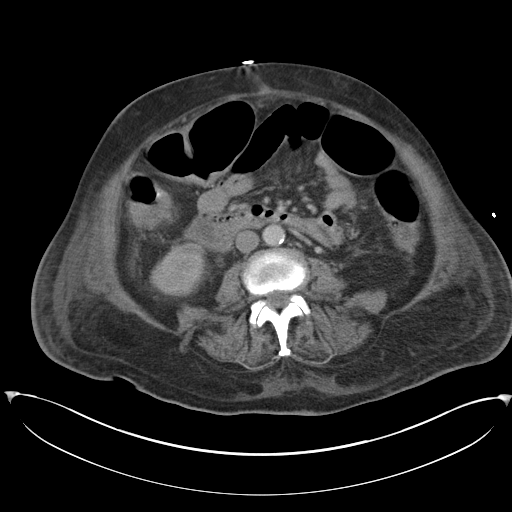
[im 70/131  soft-tissue]
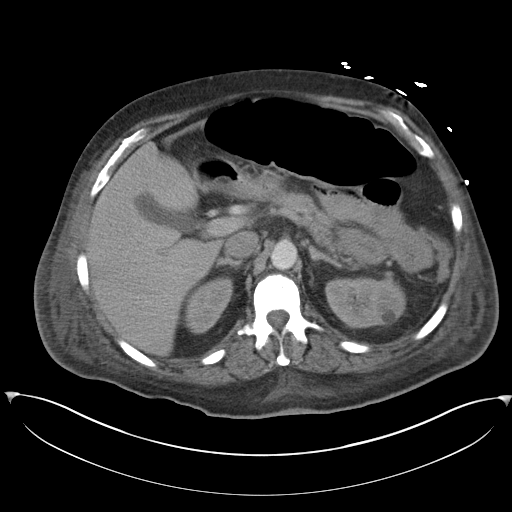
[im 79/131  soft-tissue]
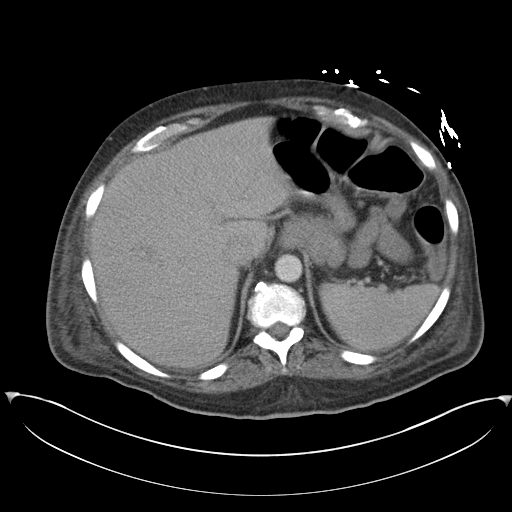
[im 87/131  soft-tissue]
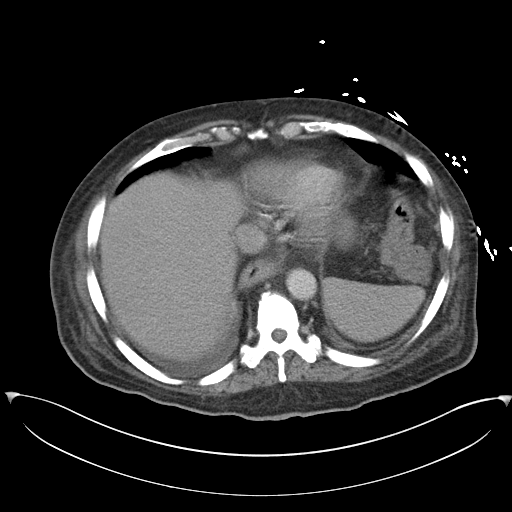
[im 96/131  soft-tissue]
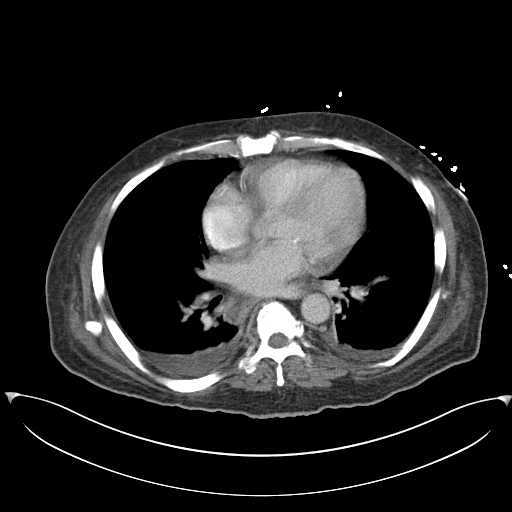
[im 96/131  bone]
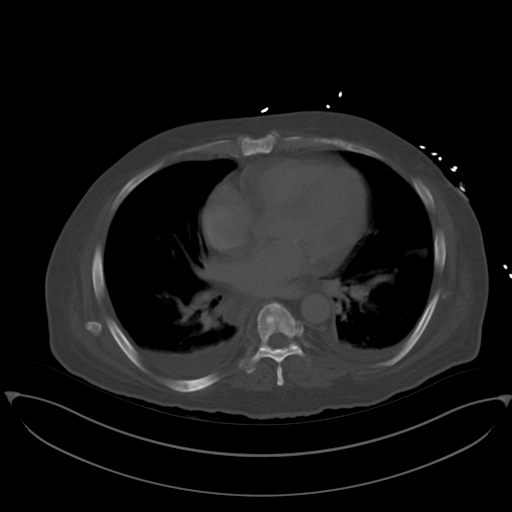
[im 113/131  soft-tissue]
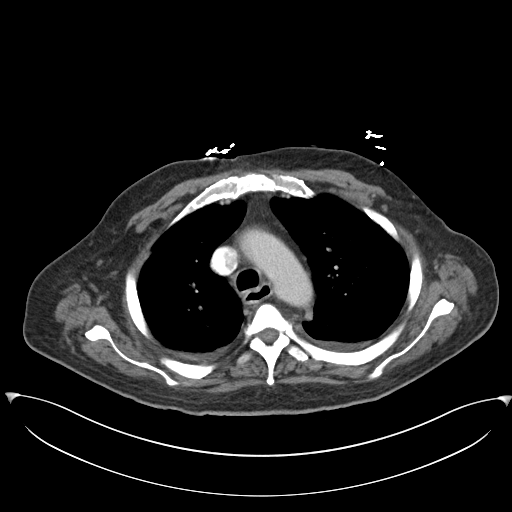
[im 122/131  soft-tissue]
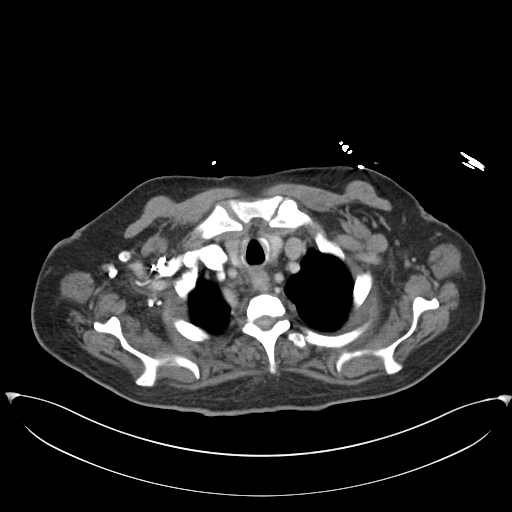

[Series 5: coronal a/|p · coronal · 0.82mm/px · 3 of 101 slices shown]
[im 34/101  soft-tissue]
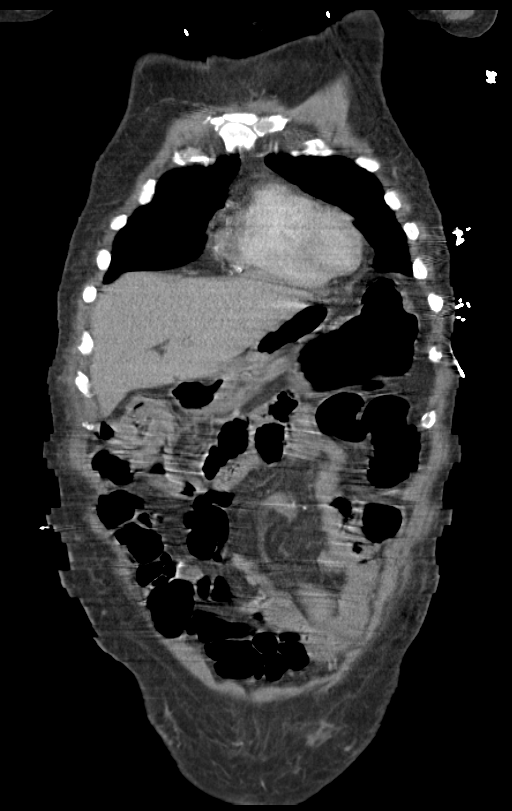
[im 45/101  soft-tissue]
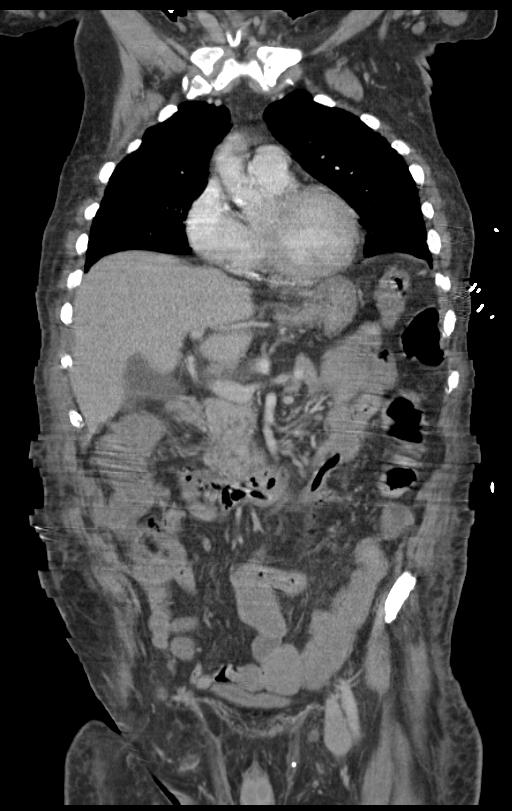
[im 56/101  soft-tissue]
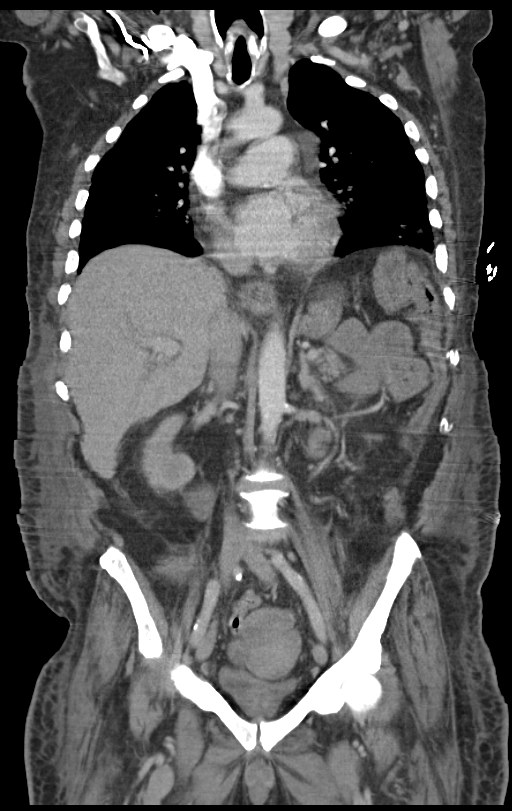

[14 of 46 positions shown; findings below may reference images not displayed]

FINDINGS: CT CHEST

Trace bilateral pleural effusions are noted, with mild bibasilar
atelectasis. No pneumothorax is seen. No significant focal airspace
consolidation is appreciated. No masses are identified.

Diffuse coronary artery calcifications are seen. The mediastinum is
otherwise unremarkable. No mediastinal lymphadenopathy is
appreciated. The great vessels are grossly unremarkable in
appearance. Incidental note is made of a retroesophageal course of
the right subclavian artery. No pericardial effusion is identified.
The thyroid gland is unremarkable in appearance. No axillary
lymphadenopathy is appreciated.

Diffuse sclerotic lesions are noted throughout the visualized
osseous structures, reflecting known metastatic prostate cancer.

CT ABDOMEN AND PELVIS

The liver and spleen are unremarkable in appearance. The gallbladder
is within normal limits. The pancreas and adrenal glands are
unremarkable.

Scattered bilateral renal cysts are seen, measuring up to 3.4 cm in
size. There is no evidence of hydronephrosis. No renal or ureteral
stones are seen. Mild nonspecific perinephric stranding is noted
bilaterally. Contrast progresses into the renal calyces only on
repeat delayed images at 11 minutes, concerning for renal
insufficiency.

No free fluid is identified. The small bowel is unremarkable in
appearance. A bowel suture line at the mid abdomen is unremarkable.
The stomach is within normal limits. No acute vascular abnormalities
are seen. Minimal calcification is noted along the abdominal aorta
and its branches.

The appendix is normal in caliber, without evidence of appendicitis.
The colon is unremarkable in appearance, aside from mild wall
thickening along the rectum, raising question for mild proctitis.
Mild nonspecific presacral stranding is noted.

The bladder is decompressed and not well assessed. The prostate
remains normal in size. No inguinal lymphadenopathy is seen.

Diffuse sclerotic lesions are noted throughout the visualized
osseous structures, reflecting known metastatic prostate cancer.
IMPRESSION: 1. Trace bilateral pleural effusions, with mild bibasilar
atelectasis. No definite evidence of pneumonia.
2. Apparent mild wall thickening along the rectum raises question
for mild proctitis. Mild nonspecific presacral stranding noted.
3. Delayed excretion of contrast from the kidneys, concerning for
renal insufficiency.
4. Diffuse coronary artery calcifications seen.
5. Incidental note of a retroesophageal course of the right
subclavian artery.
6. Scattered bilateral renal cysts seen.
7. Diffuse sclerotic lesions throughout the visualized osseous
structures, reflecting known metastatic prostate cancer.

## 2016-12-31 IMAGING — DX DG CHEST 1V PORT
1 series · 1 of 1 positions shown · non-contrast
Comparison: [DATE] [DATE], [DATE] [DATE] a.m.

CLINICAL DATA: Central line placement

EXAM:
PORTABLE CHEST 1 VIEW

[chest ap]
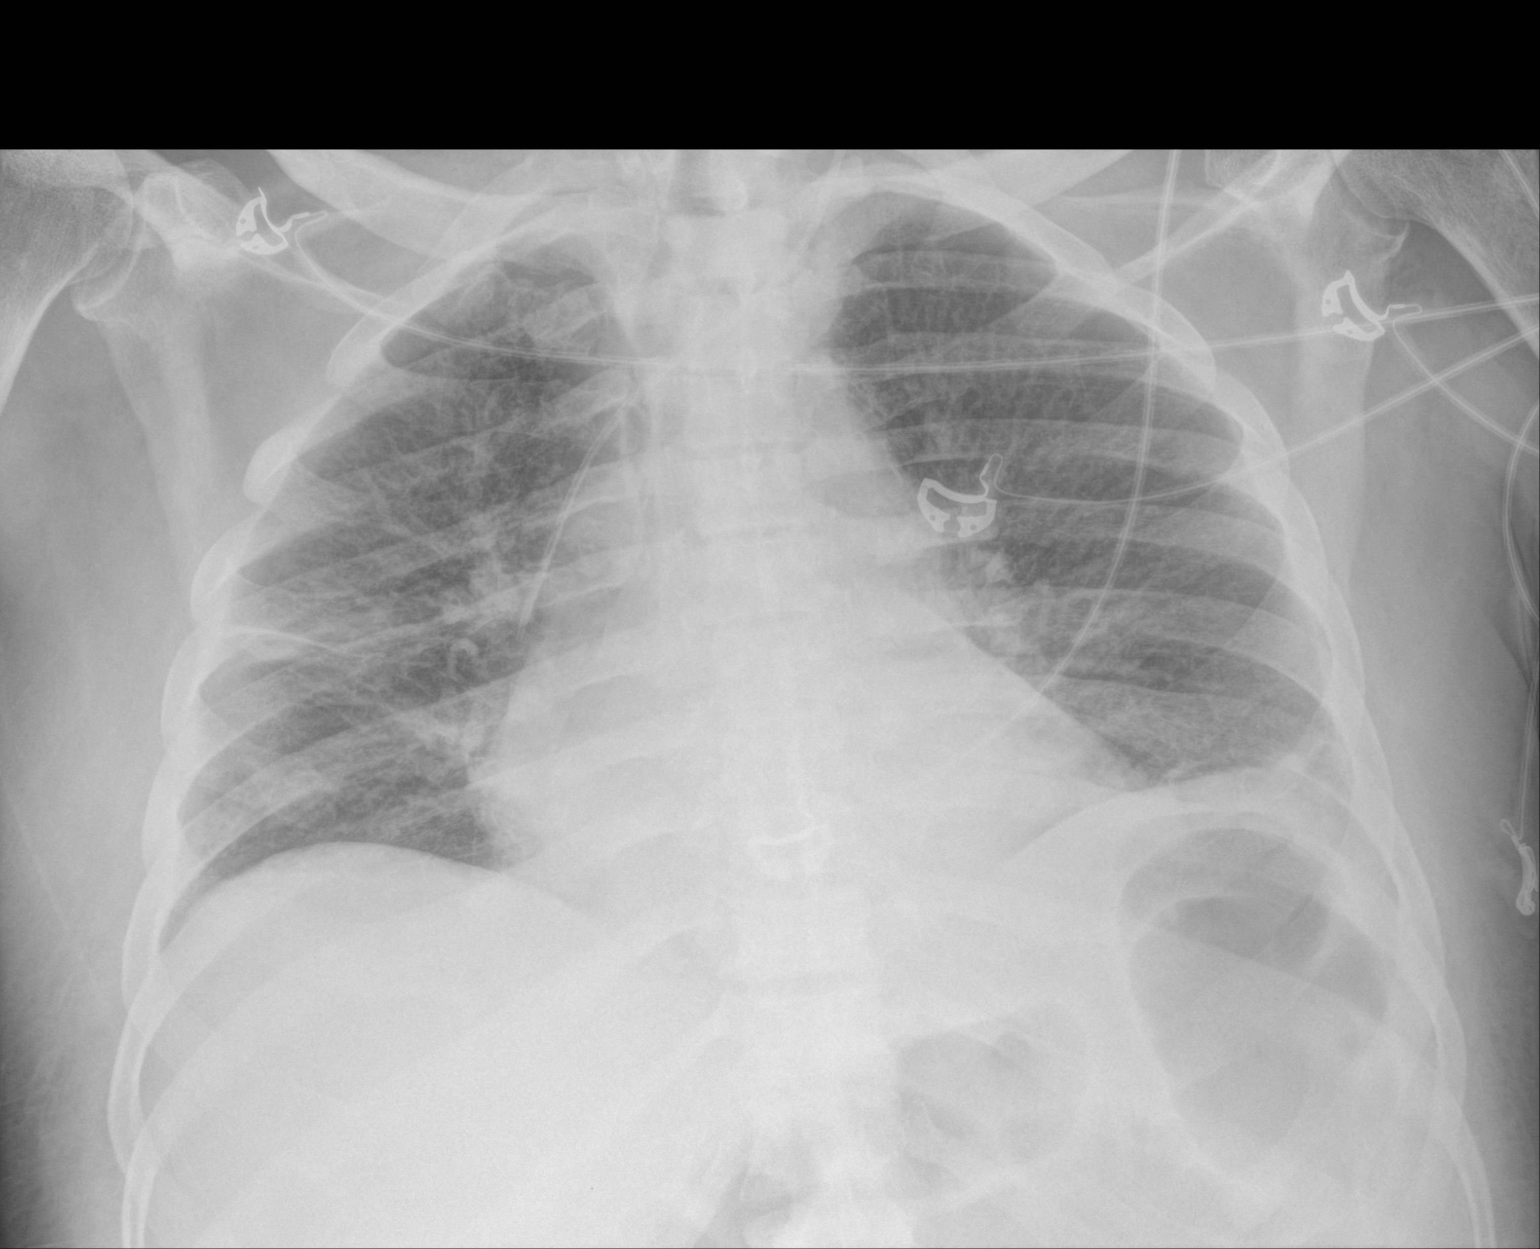

[1 of 1 positions shown; findings below may reference images not displayed]

FINDINGS: The heart size and mediastinal contours are stable. Left jugular
central venous line is identified with distal tip in superior vena
cava. There is no pneumothorax. There are are mild atelectasis of
both lung bases with minimal left pleural effusion. The visualized
skeletal structures are stable.
IMPRESSION: Left central venous line distal tip in the superior vena cava. There
is no pneumothorax.

Mild atelectasis of both lung bases with minimal left pleural
effusion.

## 2016-12-31 IMAGING — CR DG CHEST 2V
2 series · 2 of 2 positions shown · non-contrast
Comparison: CT chest June 05, 2015 at 8449 hours

CLINICAL DATA: Fever, nausea and diarrhea today. History of
diabetes, hypertension, tachycardia.

EXAM:
CHEST  2 VIEW

[w chest lat]
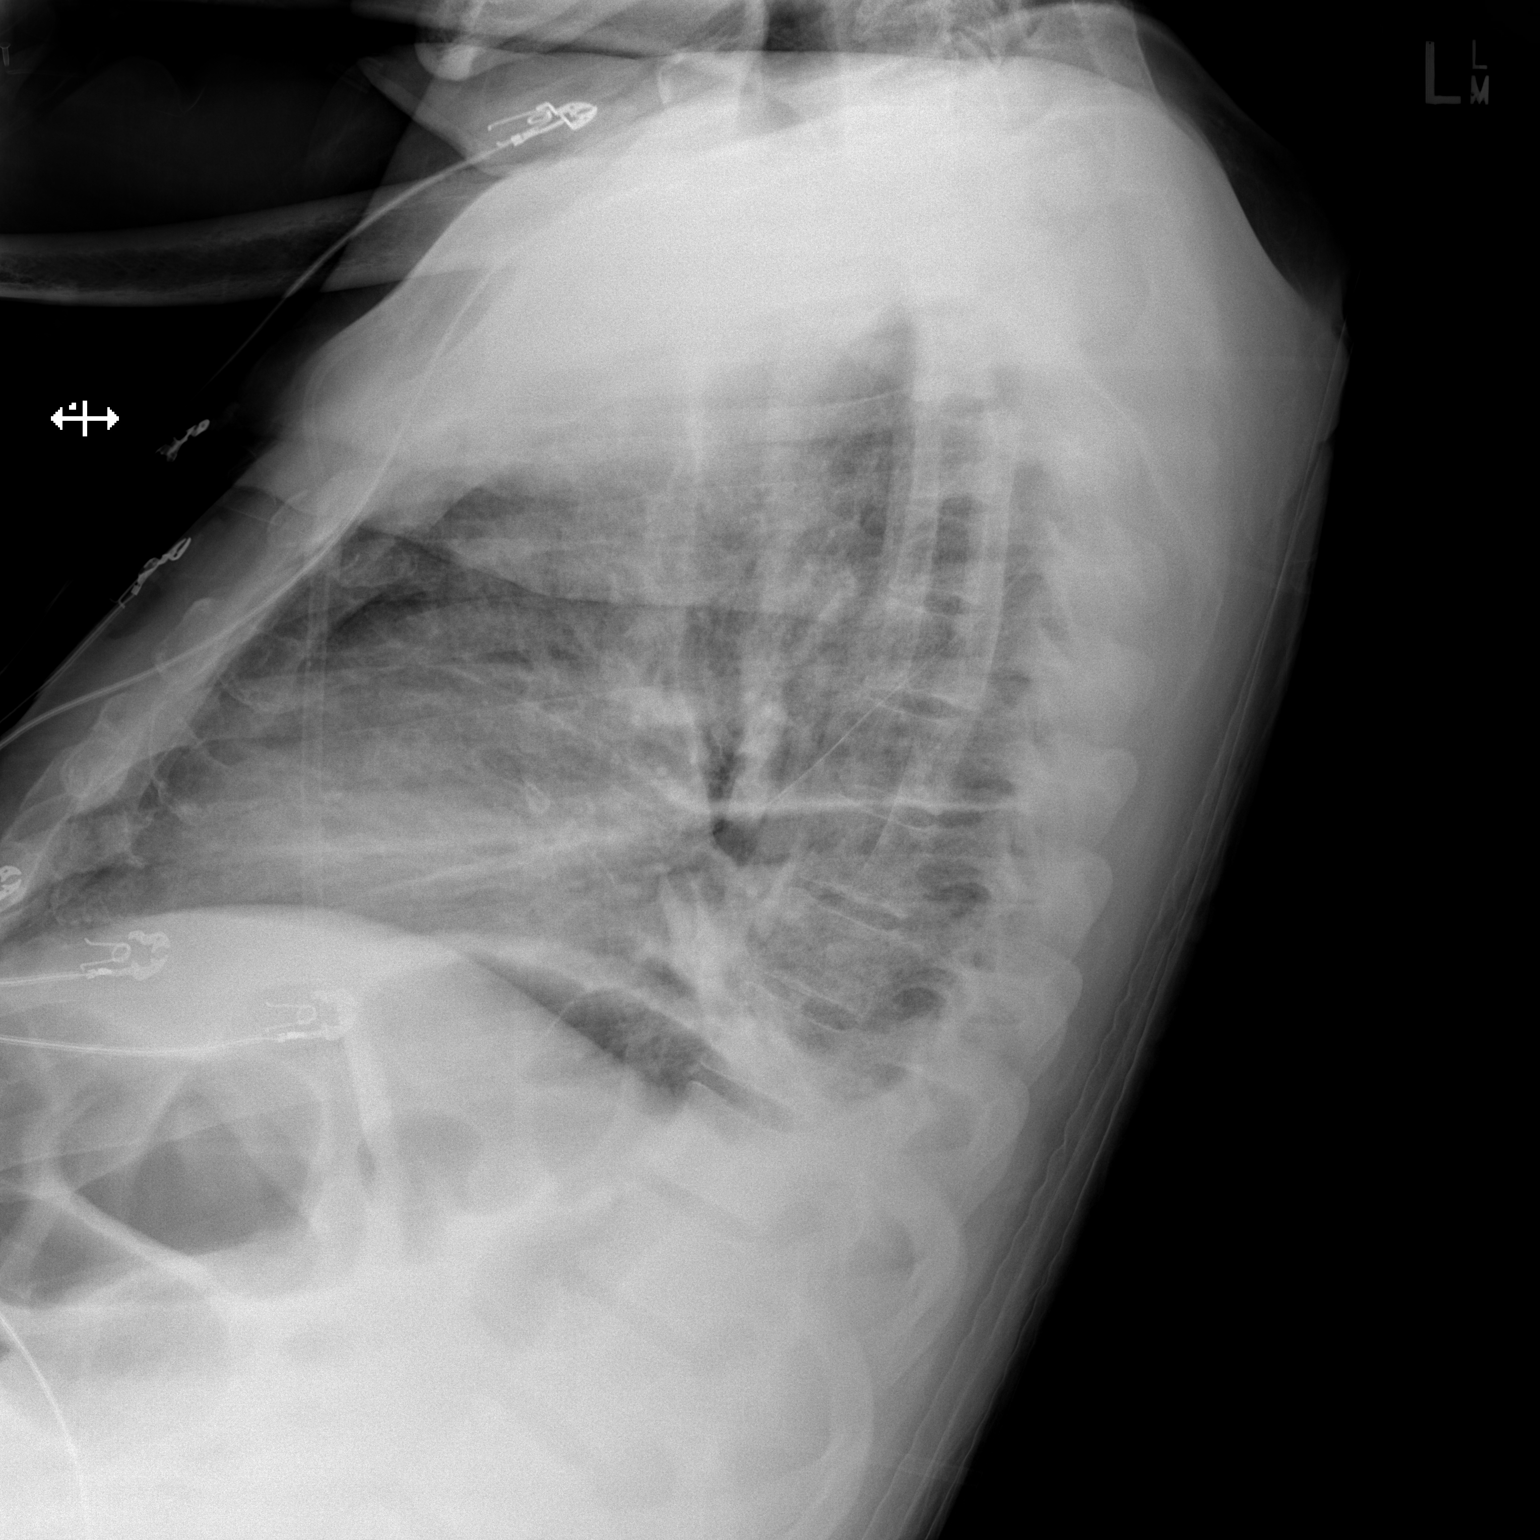

[x chest ap]
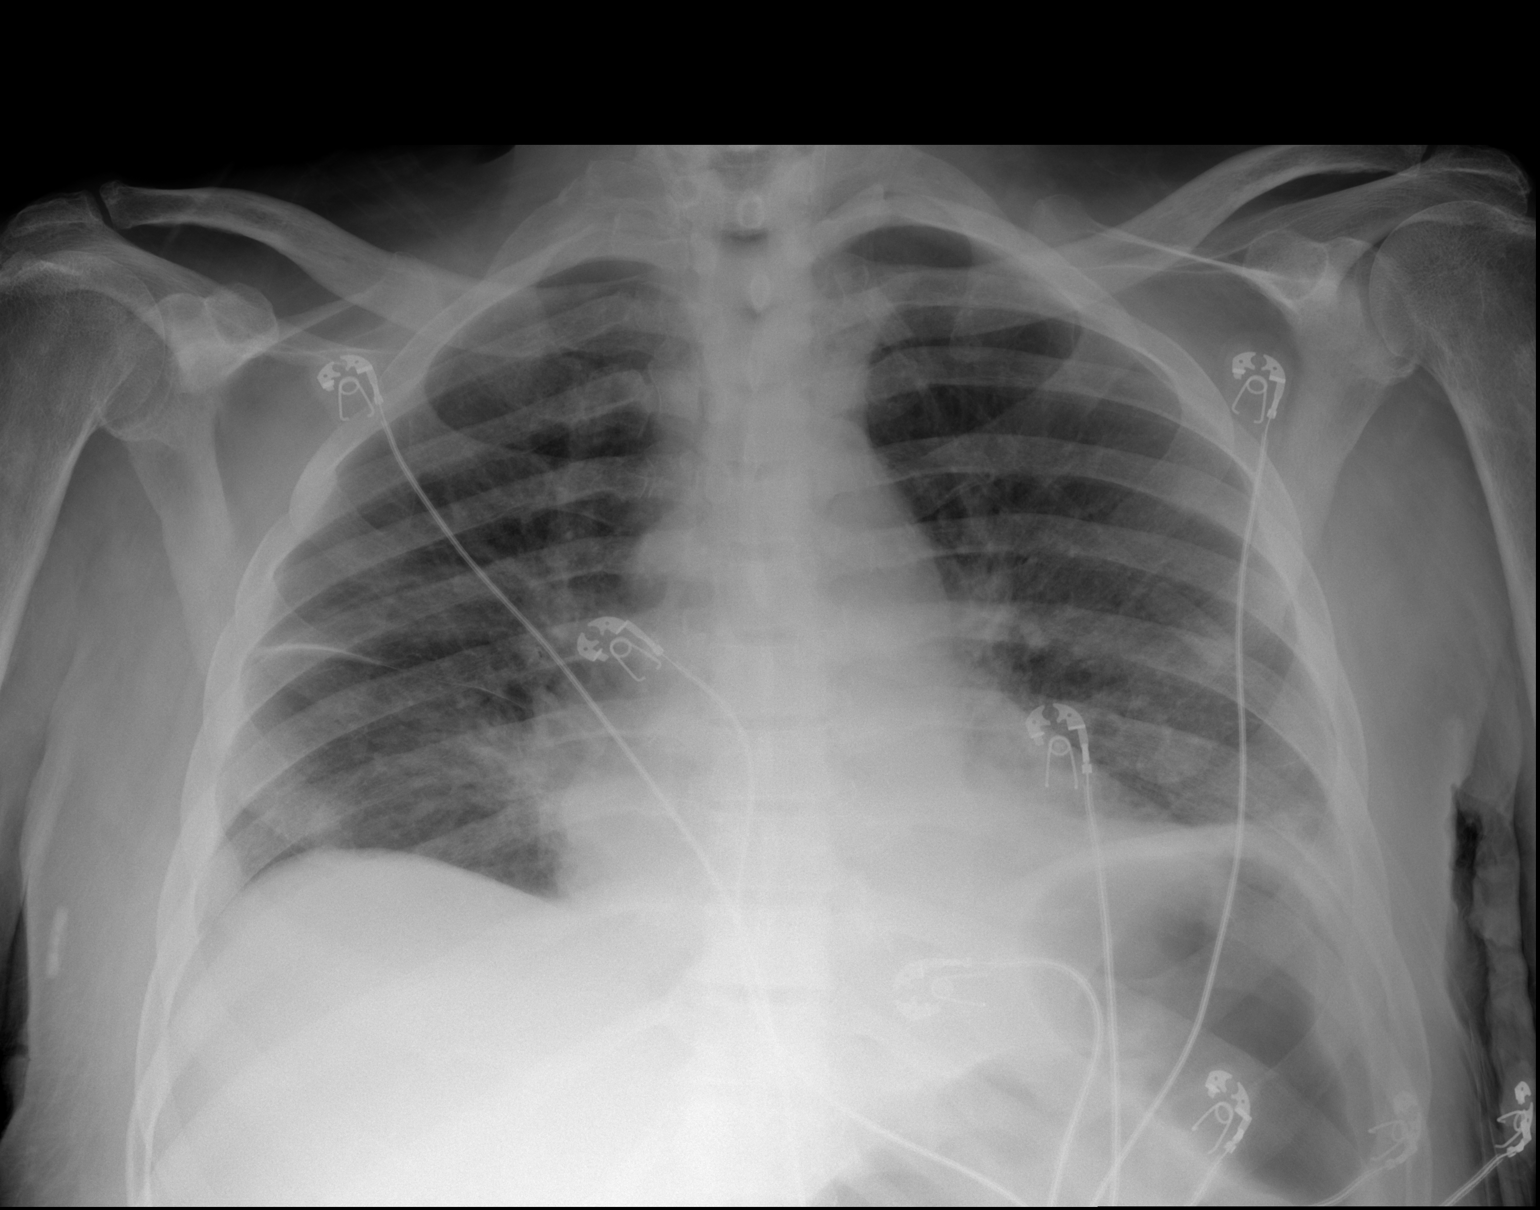

[2 of 2 positions shown; findings below may reference images not displayed]

FINDINGS: Cardiomediastinal silhouette is normal for this low inspiratory
portable examination with crowded vascular markings. Nipple shadows
in lung bases. Small pleural effusions better seen on today's CT
chest. Bibasilar atelectasis. No pneumothorax. Diffusely sclerotic
axial skeleton compatible with patient's history of metastatic
prostate cancer.
IMPRESSION: Small pleural effusions and bibasilar atelectasis better seen on
today's dedicated CT chest.

## 2016-12-31 IMAGING — DX DG CHEST 1V PORT
1 series · 1 of 1 positions shown · non-contrast
Comparison: Single view of the chest and PA, lateral chest and CT
chest earlier today.

CLINICAL DATA: Acute respiratory failure. History of prostate
cancer.

EXAM:
PORTABLE CHEST 1 VIEW

[chest ap]
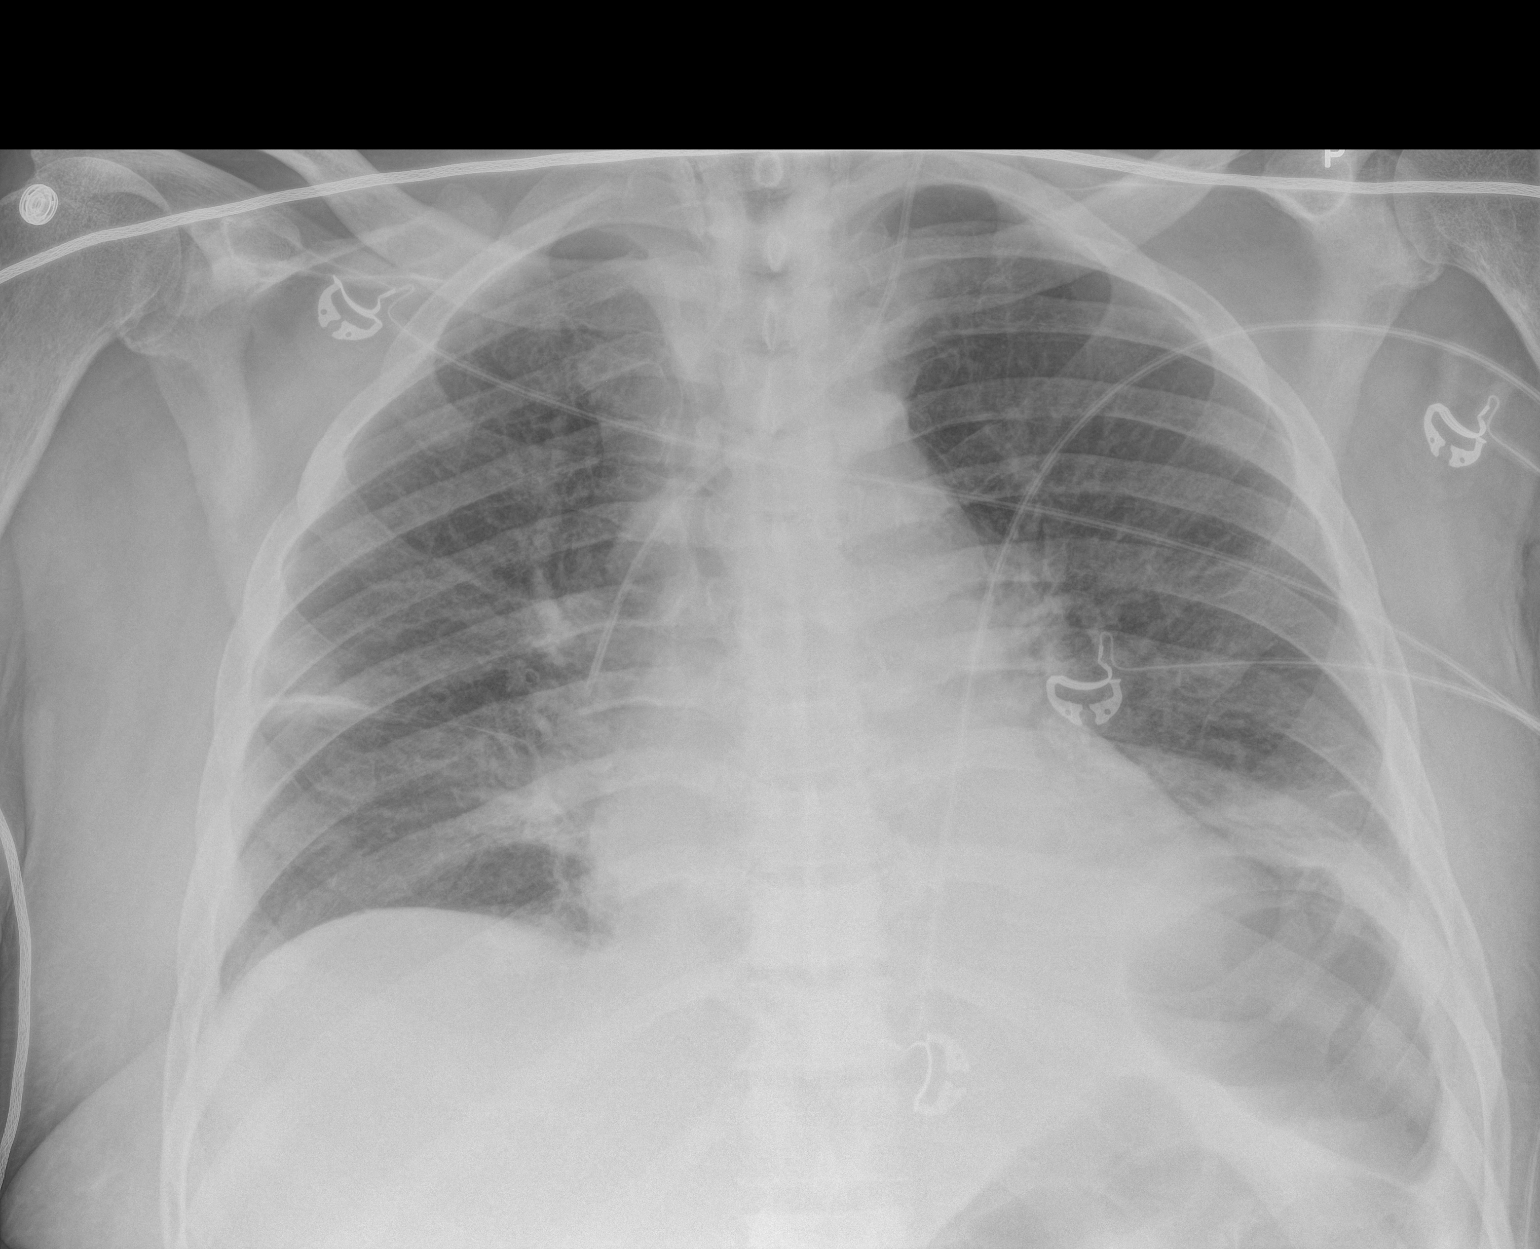

[1 of 1 positions shown; findings below may reference images not displayed]

FINDINGS: Left IJ catheter is unchanged. Small bilateral pleural effusions and
subsegmental atelectasis left base appearing chain. No pneumothorax.
IMPRESSION: No change in left basilar atelectasis and small bilateral pleural
effusions.

## 2017-01-02 IMAGING — DX DG CHEST 1V PORT
1 series · 2 of 2 positions shown · non-contrast
Comparison: 07/05/2015 and earlier.

CLINICAL DATA: 79-year-old male with respiratory failure. Initial
encounter. Metastatic prostate cancer.

EXAM:
PORTABLE CHEST 1 VIEW

[Series 1: chest ap · 0.14mm/px · 2 of 2 slices shown]
[im 1/2]
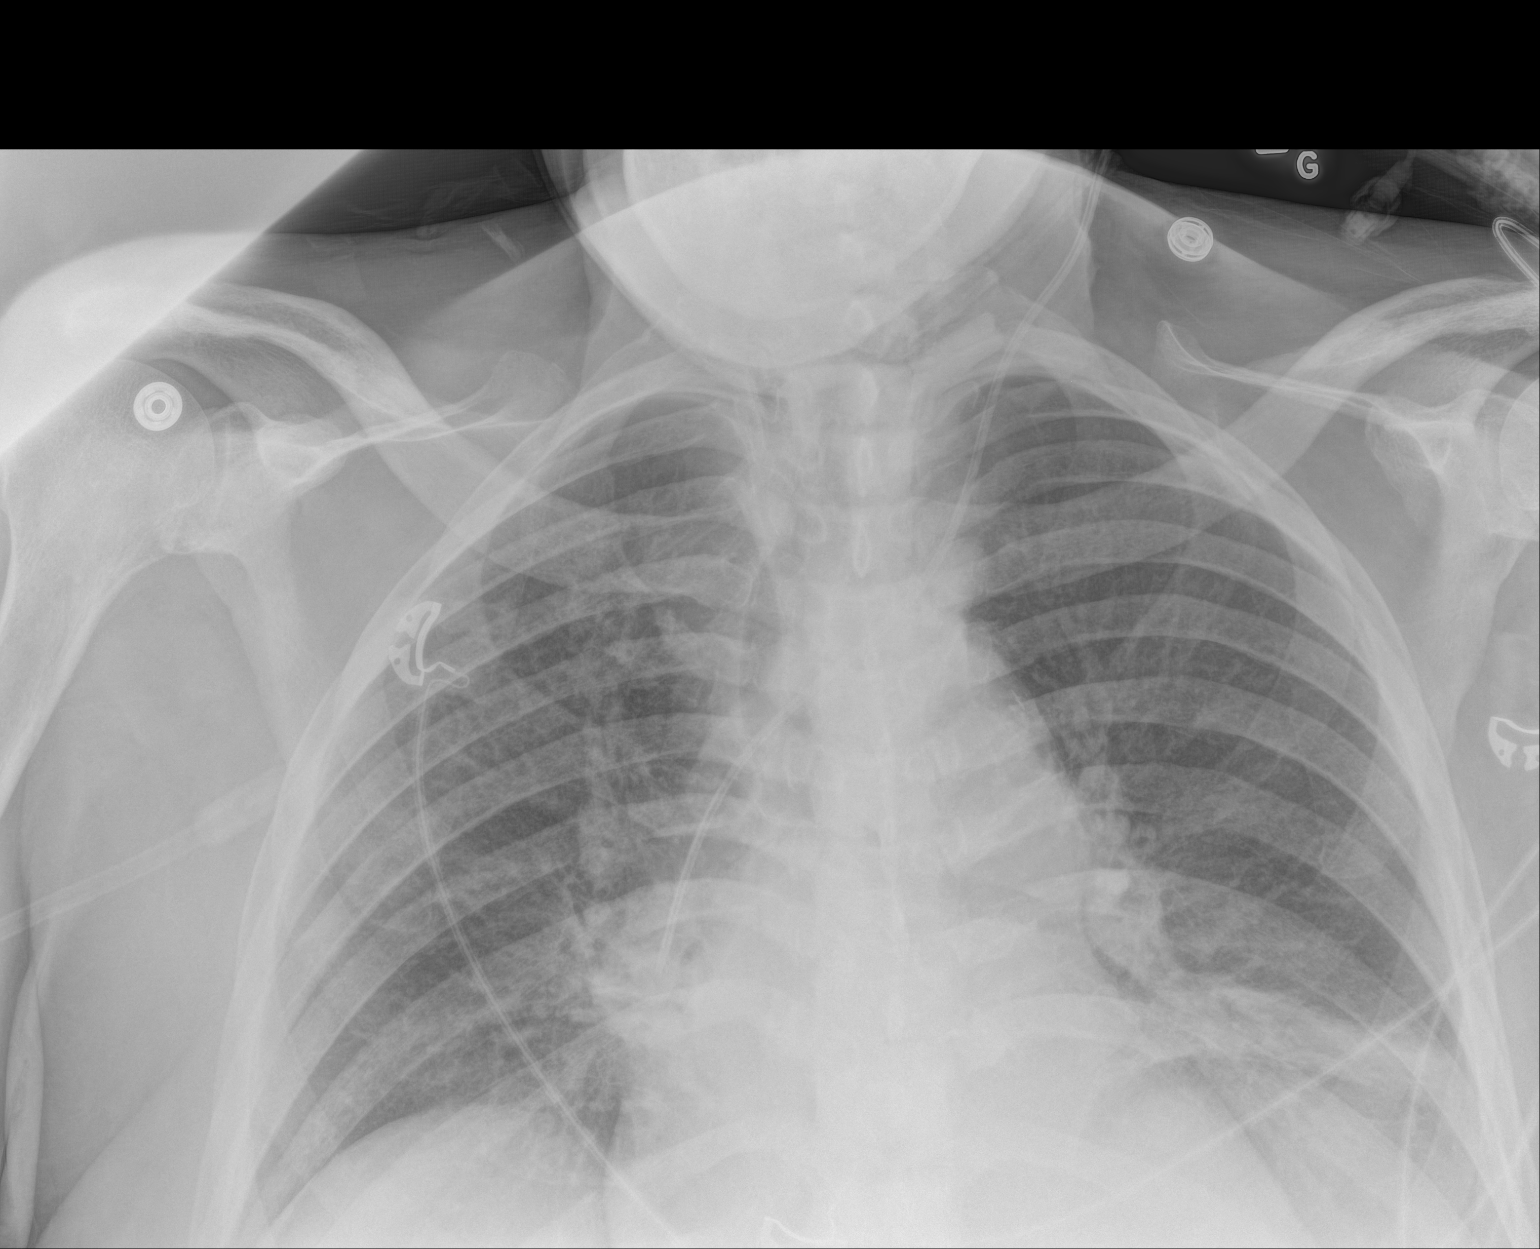
[im 2/2]
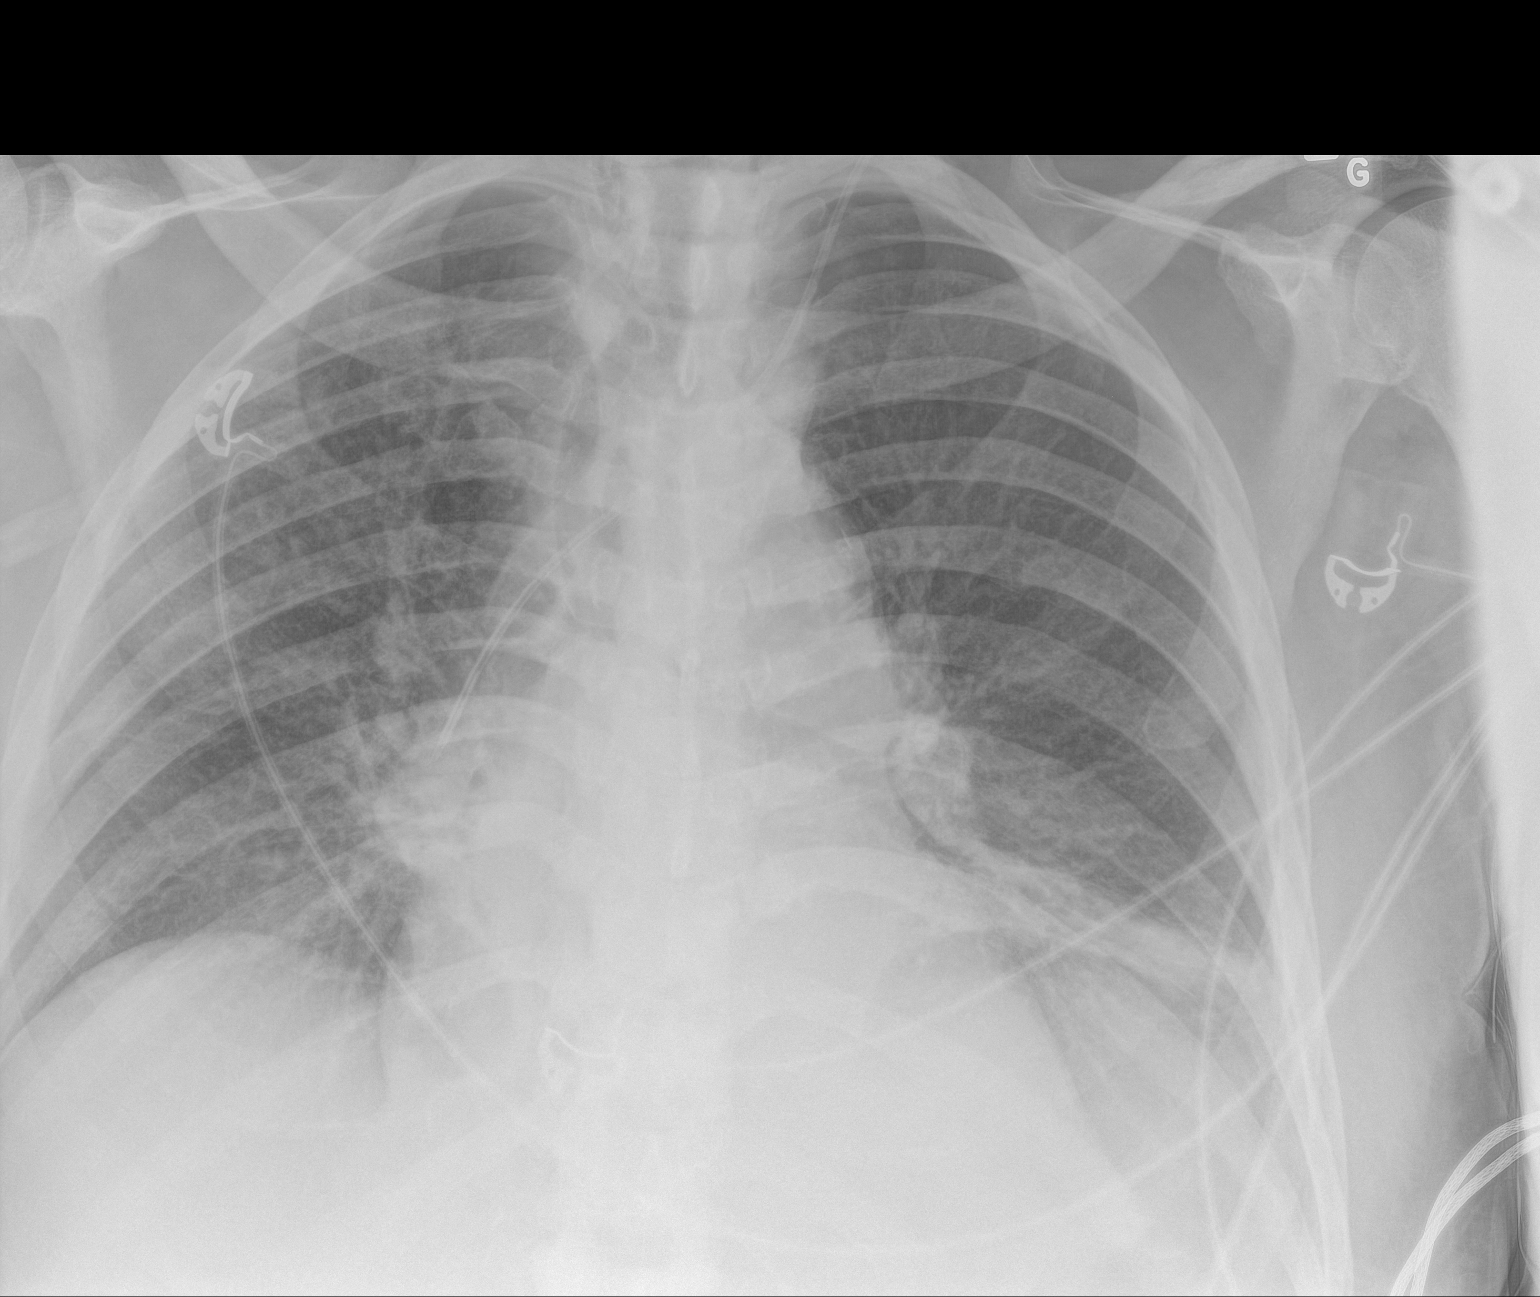

[2 of 2 positions shown; findings below may reference images not displayed]

FINDINGS: Portable AP semi upright view at at 4334 hours. Stable left IJ
central line. Continued confluent platelike opacity at the left lung
base. Atelectasis along the right minor fissure has decreased. Small
bilateral pleural effusions were more apparent on the 07/05/2015 CT.
Pulmonary vascularity is stable. Stable cardiac size and mediastinal
contours.
IMPRESSION: Overall stable ventilation since 07/05/2015. Left greater than right
pulmonary atelectasis and small pleural effusions.

## 2017-02-26 IMAGING — CR DG HIP (WITH OR WITHOUT PELVIS) 2-3V*L*
3 series · 3 of 3 positions shown · non-contrast
Comparison: None.

CLINICAL DATA: 79-year-old with current history of metastatic
prostate cancer, presenting with acute onset left hip pain. No
recent injuries.

EXAM:
DG HIP (WITH OR WITHOUT PELVIS) 2-3V LEFT

[x pelvis]
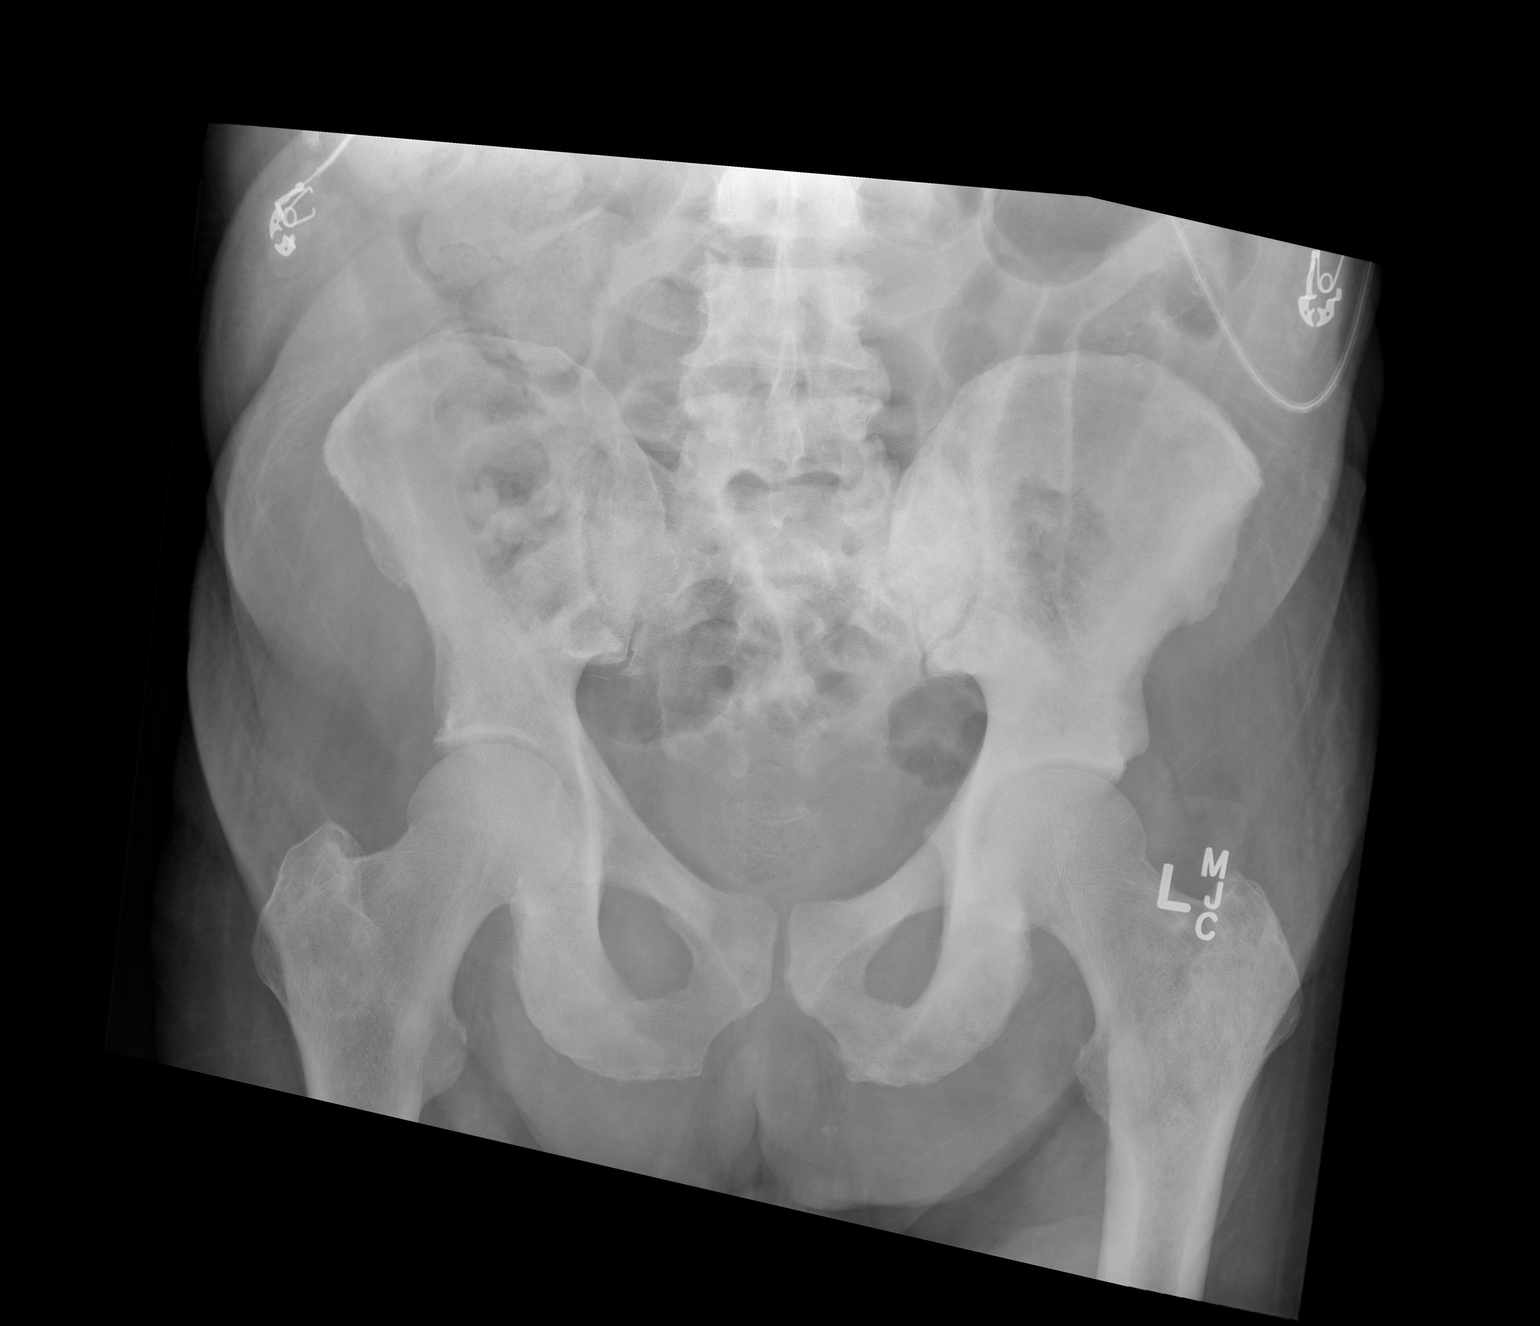

[x hip ap left]
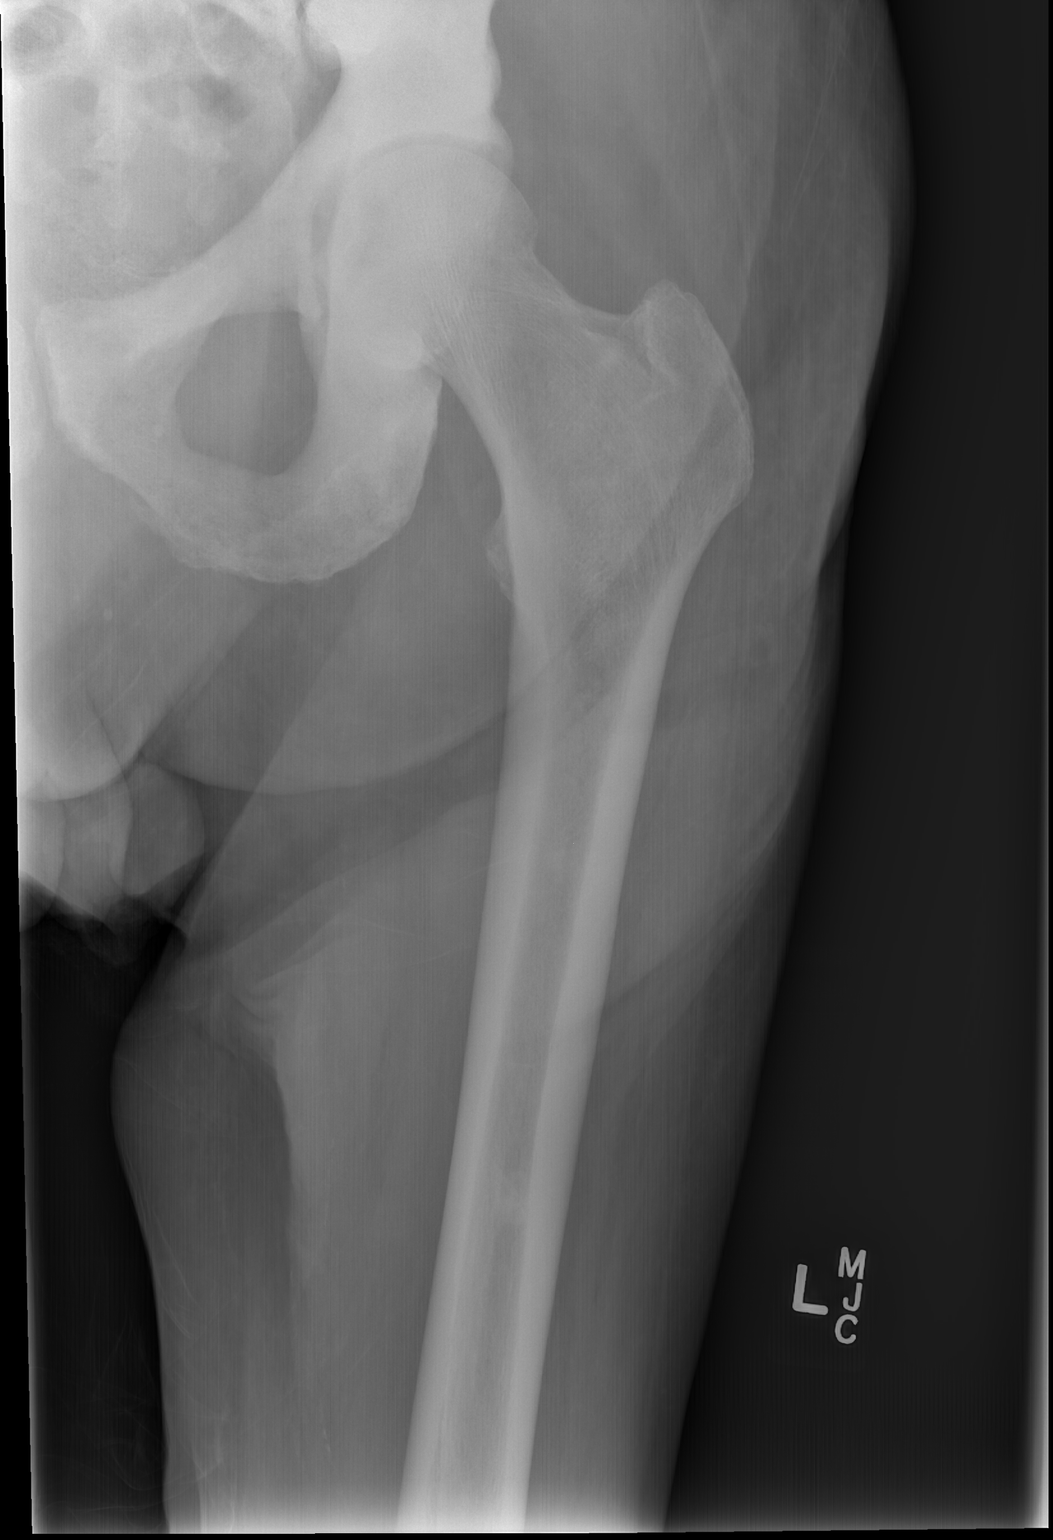

[x hip lat left]
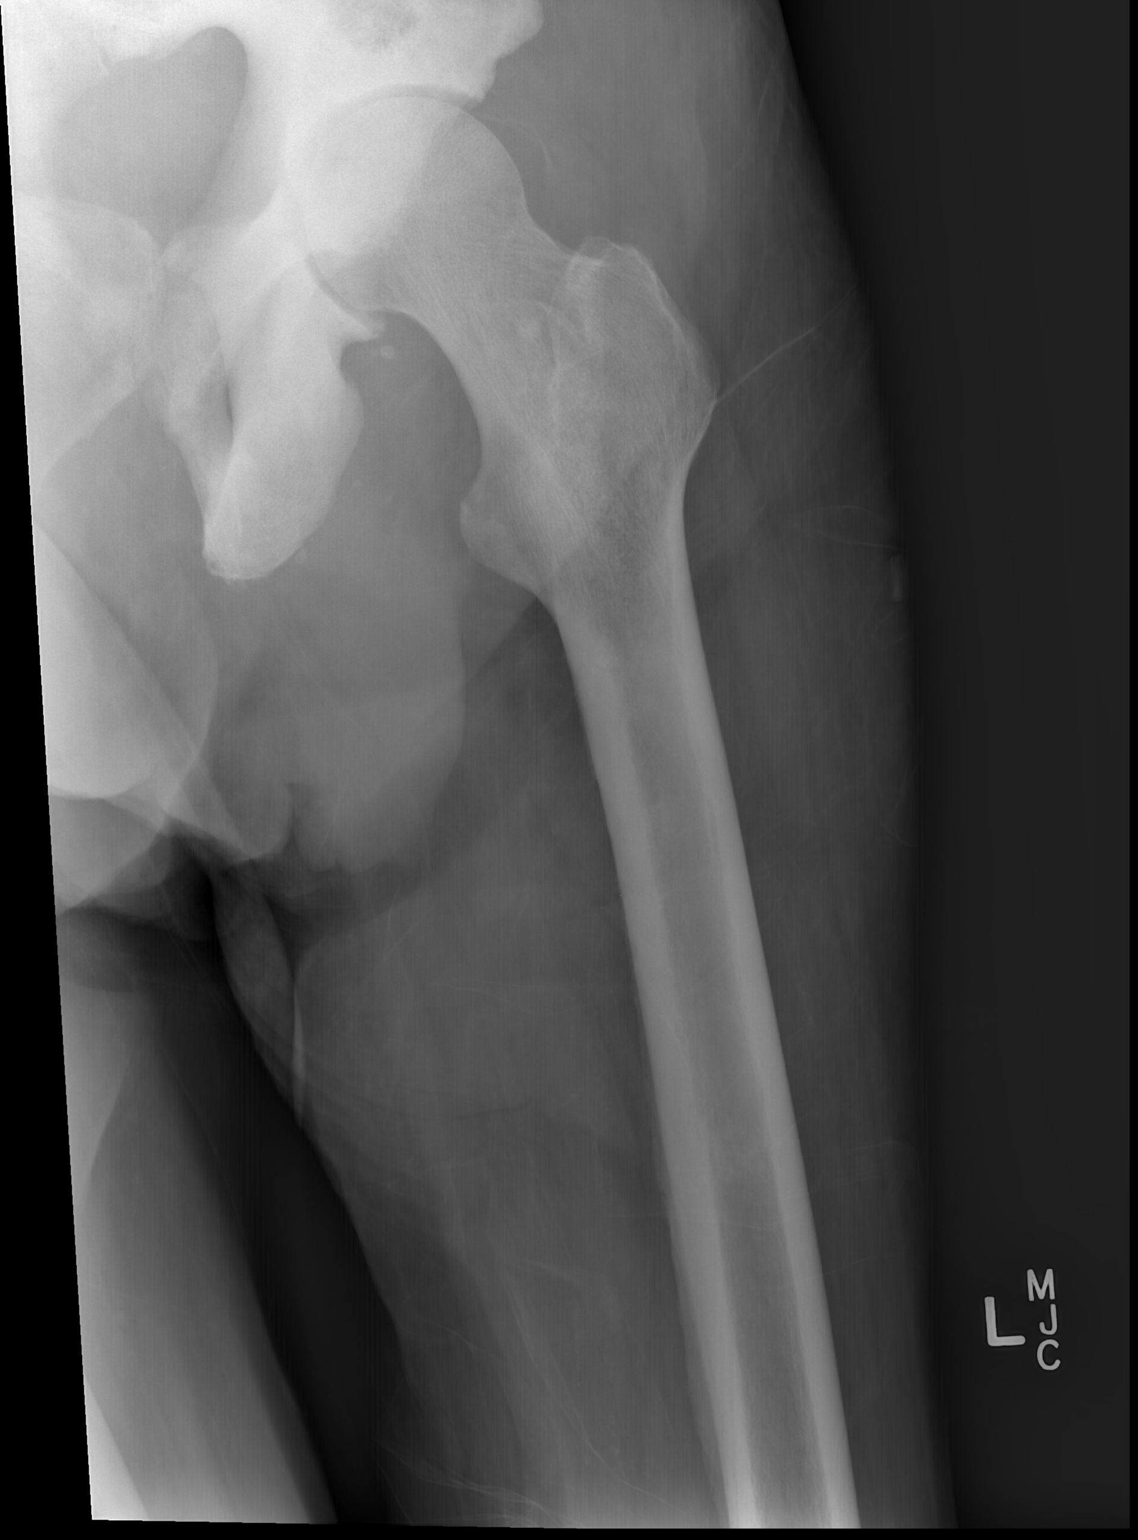

[3 of 3 positions shown; findings below may reference images not displayed]

FINDINGS: No evidence of acute or subacute fracture or dislocation. Sclerotic
osseous metastatic disease involving much of the left hemipelvis,
including the entire left acetabulum. Mild to moderate axial joint
space narrowing.

Sclerotic osseous metastases elsewhere in the right side of the
pelvis, the sacrum, the proximal femora, and the visualized lower
lumbar spine. Sacroiliac joints and symphysis pubis intact.
Symmetric mild to moderate axial joint space narrowing in the
contralateral right hip.
IMPRESSION: 1. No acute osseous abnormality.
2. Sclerotic osseous metastatic disease throughout the left
hemipelvis, including the entire left acetabulum.
3. Sclerotic osseous metastatic disease elsewhere throughout the
right hemipelvis, sacrum, lower lumbar spine and bilateral proximal
femora.
4. Symmetric mild to moderate osteoarthritis in both hips.

## 2017-03-03 IMAGING — DX DG SHOULDER 1V*L*
2 series · 2 of 2 positions shown · non-contrast
Comparison: None

CLINICAL DATA: Pain without trauma

EXAM:
LEFT SHOULDER - 1 VIEW

[shoulder ap]
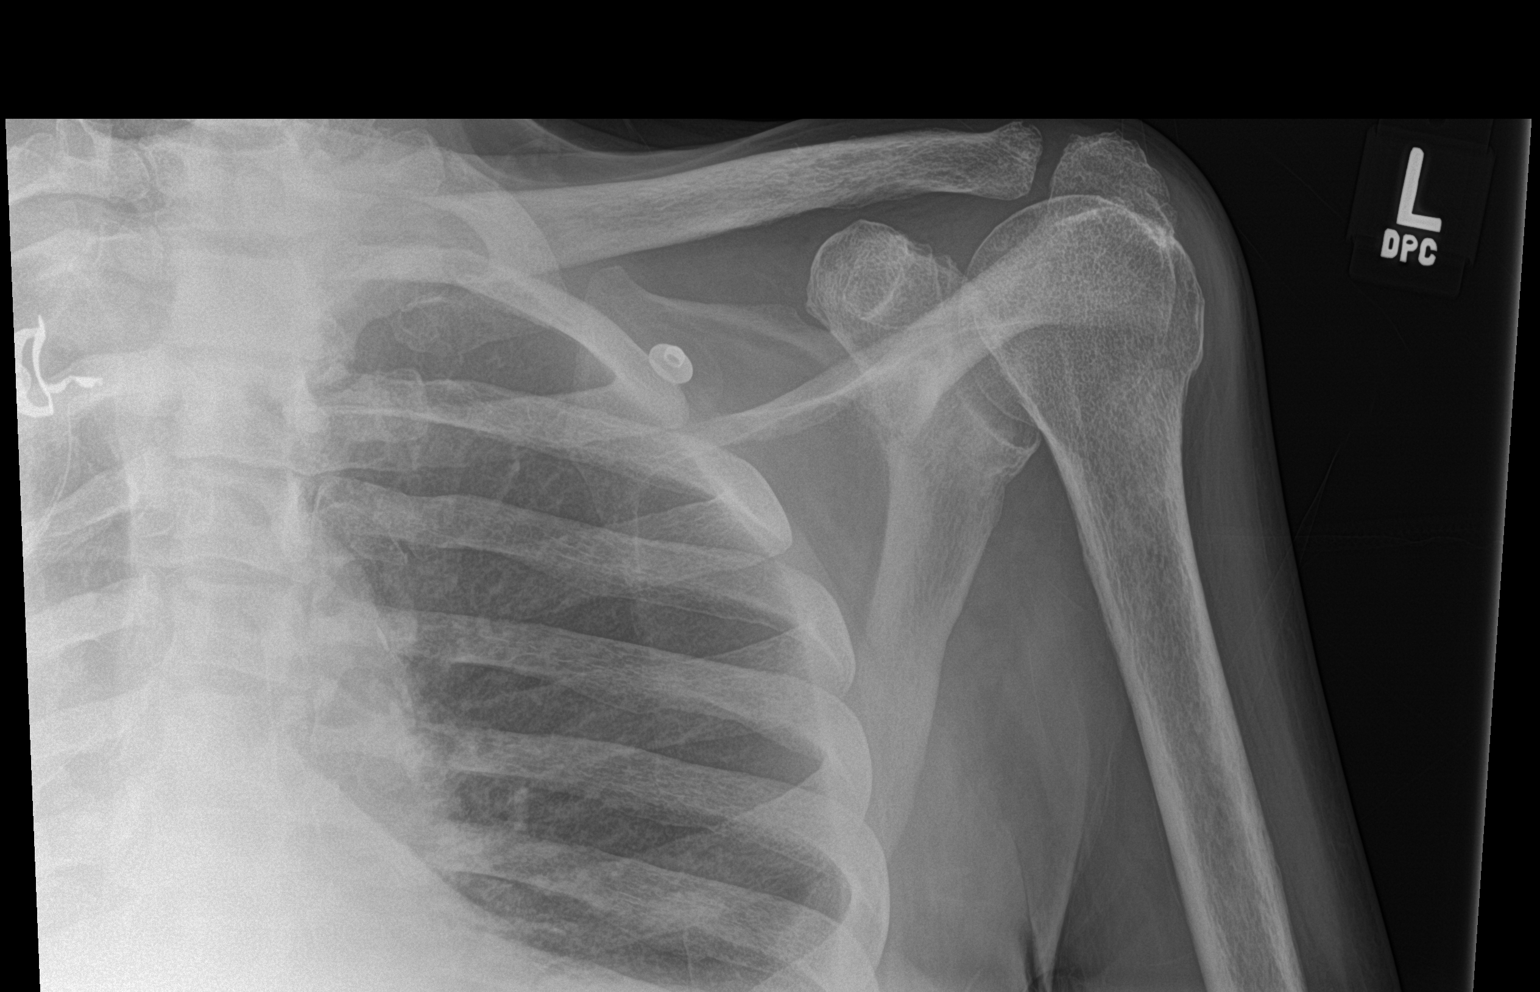

[shoulder obl]
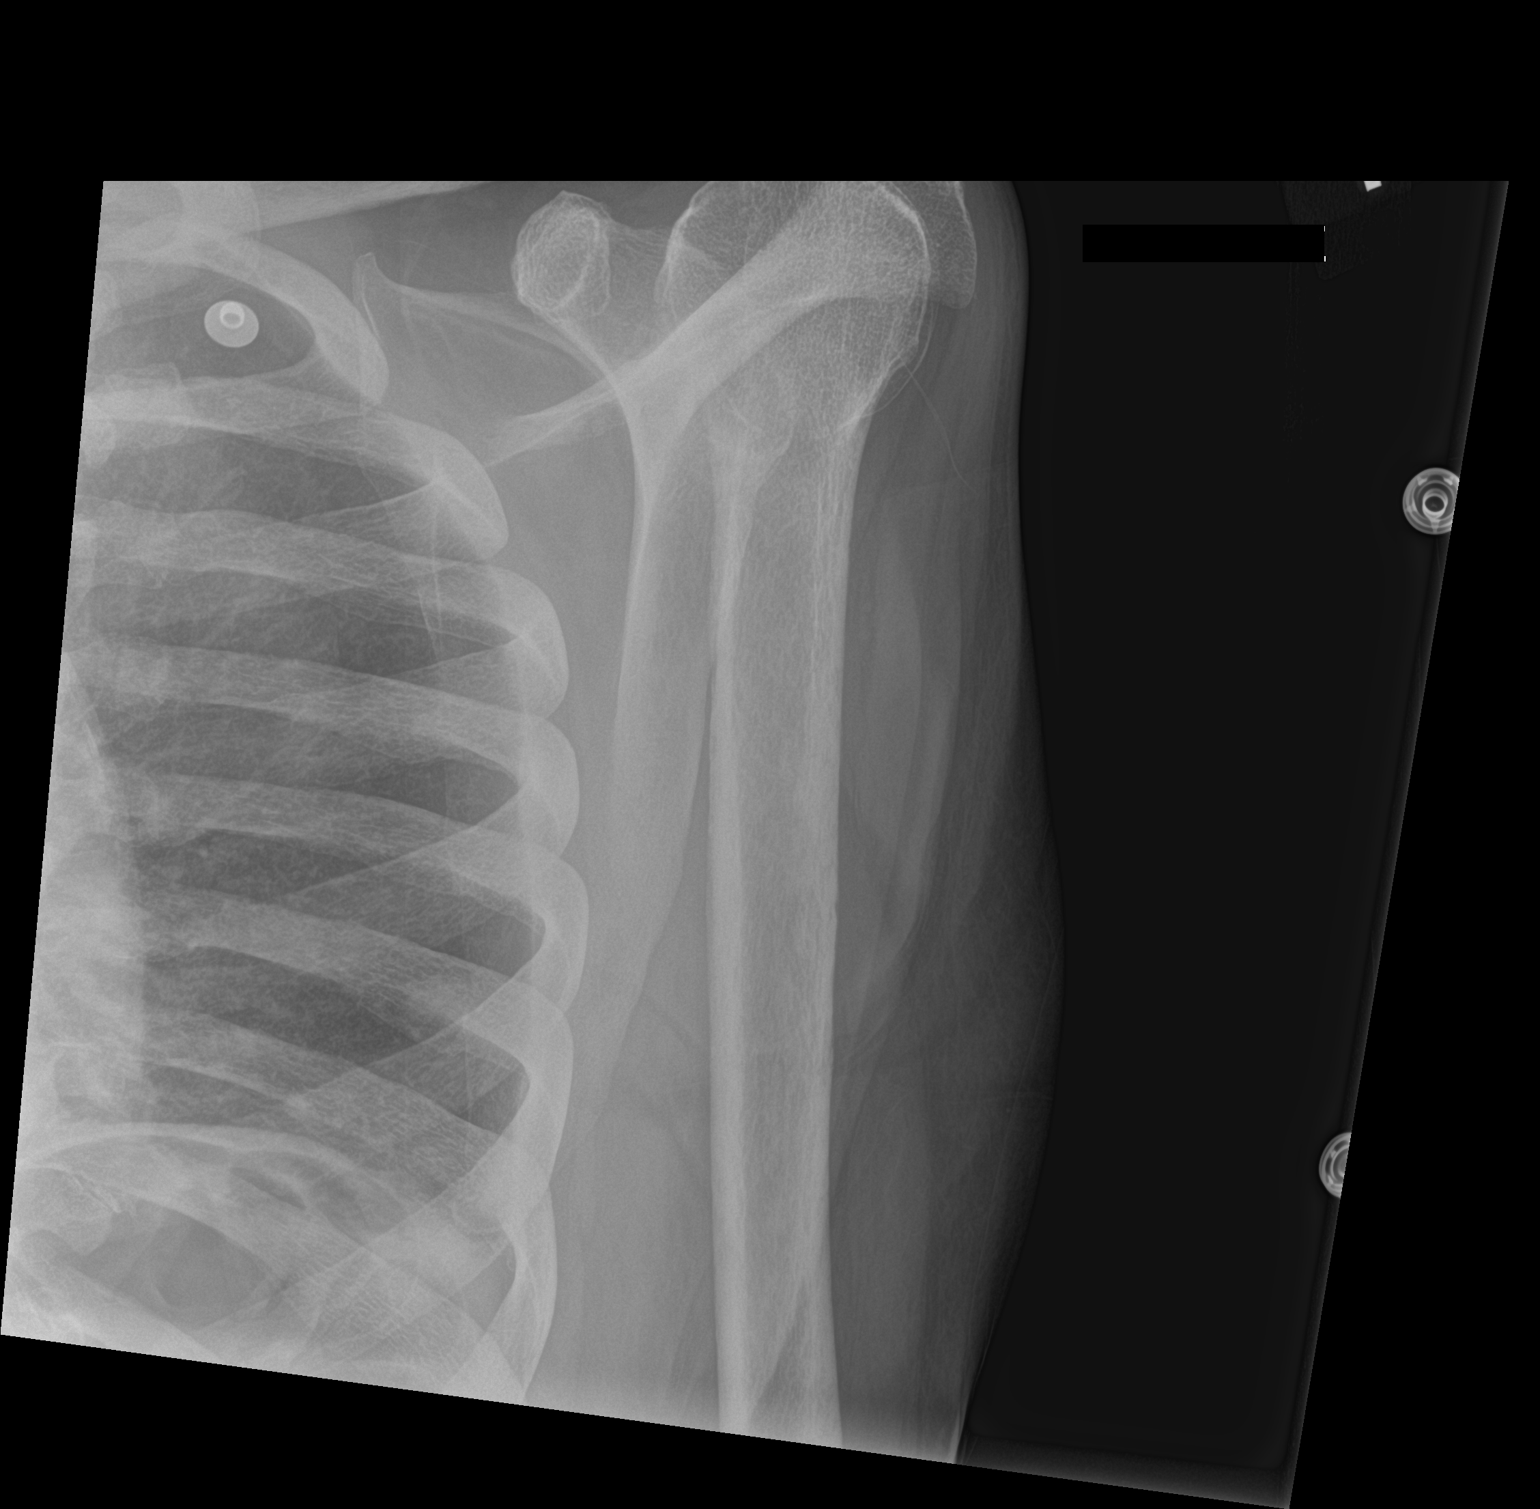

[2 of 2 positions shown; findings below may reference images not displayed]

FINDINGS: The study is limited due to only 2 images and lack of external
rotation. However, within this limitation, no fracture or
dislocation is identified.
IMPRESSION: No acute abnormality.
